# Patient Record
Sex: Male | Born: 1960
Health system: Southern US, Community
[De-identification: ages and names within clinical notes are randomized; demographics above are authoritative.]

## PROBLEM LIST (undated history)

## (undated) DIAGNOSIS — J869 Pyothorax without fistula: Secondary | ICD-10-CM

## (undated) DIAGNOSIS — E119 Type 2 diabetes mellitus without complications: Secondary | ICD-10-CM

## (undated) HISTORY — PX: SHOULDER SURGERY: SHX246

## (undated) HISTORY — PX: SKIN GRAFT: SHX250

---

## 2001-06-01 ENCOUNTER — Emergency Department (HOSPITAL_COMMUNITY): Admission: AC | Admit: 2001-06-01 | Discharge: 2001-06-01 | Payer: Self-pay

## 2001-06-01 ENCOUNTER — Encounter: Payer: Self-pay | Admitting: Emergency Medicine

## 2017-08-04 ENCOUNTER — Emergency Department (HOSPITAL_COMMUNITY): Payer: Self-pay

## 2017-08-04 ENCOUNTER — Observation Stay (HOSPITAL_COMMUNITY): Payer: Self-pay

## 2017-08-04 ENCOUNTER — Inpatient Hospital Stay (HOSPITAL_COMMUNITY)
Admission: EM | Admit: 2017-08-04 | Discharge: 2017-08-15 | DRG: 163 | Disposition: A | Discharge status: Home / Self Care | Payer: Self-pay | Attending: Family Medicine | Admitting: Family Medicine

## 2017-08-04 ENCOUNTER — Other Ambulatory Visit: Payer: Self-pay

## 2017-08-04 ENCOUNTER — Encounter (HOSPITAL_COMMUNITY): Payer: Self-pay | Admitting: Emergency Medicine

## 2017-08-04 DIAGNOSIS — J918 Pleural effusion in other conditions classified elsewhere: Secondary | ICD-10-CM | POA: Diagnosis present

## 2017-08-04 DIAGNOSIS — L304 Erythema intertrigo: Secondary | ICD-10-CM | POA: Diagnosis present

## 2017-08-04 DIAGNOSIS — F17210 Nicotine dependence, cigarettes, uncomplicated: Secondary | ICD-10-CM | POA: Diagnosis present

## 2017-08-04 DIAGNOSIS — R0602 Shortness of breath: Secondary | ICD-10-CM | POA: Diagnosis present

## 2017-08-04 DIAGNOSIS — E871 Hypo-osmolality and hyponatremia: Secondary | ICD-10-CM | POA: Diagnosis not present

## 2017-08-04 DIAGNOSIS — D751 Secondary polycythemia: Secondary | ICD-10-CM | POA: Diagnosis present

## 2017-08-04 DIAGNOSIS — B372 Candidiasis of skin and nail: Secondary | ICD-10-CM

## 2017-08-04 DIAGNOSIS — J90 Pleural effusion, not elsewhere classified: Secondary | ICD-10-CM | POA: Diagnosis present

## 2017-08-04 DIAGNOSIS — J9811 Atelectasis: Secondary | ICD-10-CM | POA: Diagnosis present

## 2017-08-04 DIAGNOSIS — Z4682 Encounter for fitting and adjustment of non-vascular catheter: Secondary | ICD-10-CM

## 2017-08-04 DIAGNOSIS — J690 Pneumonitis due to inhalation of food and vomit: Secondary | ICD-10-CM | POA: Diagnosis present

## 2017-08-04 DIAGNOSIS — J939 Pneumothorax, unspecified: Secondary | ICD-10-CM

## 2017-08-04 DIAGNOSIS — Z9689 Presence of other specified functional implants: Secondary | ICD-10-CM

## 2017-08-04 DIAGNOSIS — D72829 Elevated white blood cell count, unspecified: Secondary | ICD-10-CM | POA: Diagnosis present

## 2017-08-04 DIAGNOSIS — R109 Unspecified abdominal pain: Secondary | ICD-10-CM | POA: Diagnosis present

## 2017-08-04 DIAGNOSIS — D7589 Other specified diseases of blood and blood-forming organs: Secondary | ICD-10-CM | POA: Diagnosis present

## 2017-08-04 DIAGNOSIS — E876 Hypokalemia: Secondary | ICD-10-CM

## 2017-08-04 DIAGNOSIS — Z66 Do not resuscitate: Secondary | ICD-10-CM | POA: Diagnosis present

## 2017-08-04 DIAGNOSIS — J869 Pyothorax without fistula: Principal | ICD-10-CM | POA: Diagnosis present

## 2017-08-04 DIAGNOSIS — L84 Corns and callosities: Secondary | ICD-10-CM | POA: Diagnosis present

## 2017-08-04 DIAGNOSIS — Z9889 Other specified postprocedural states: Secondary | ICD-10-CM

## 2017-08-04 DIAGNOSIS — Z09 Encounter for follow-up examination after completed treatment for conditions other than malignant neoplasm: Secondary | ICD-10-CM

## 2017-08-04 LAB — CBC
HCT: 52.1 % — ABNORMAL HIGH (ref 39.0–52.0)
HEMOGLOBIN: 18.8 g/dL — AB (ref 13.0–17.0)
MCH: 35.5 pg — ABNORMAL HIGH (ref 26.0–34.0)
MCHC: 36.1 g/dL — ABNORMAL HIGH (ref 30.0–36.0)
MCV: 98.5 fL (ref 78.0–100.0)
Platelets: 225 10*3/uL (ref 150–400)
RBC: 5.29 MIL/uL (ref 4.22–5.81)
RDW: 14.2 % (ref 11.5–15.5)
WBC: 14.6 10*3/uL — AB (ref 4.0–10.5)

## 2017-08-04 LAB — BODY FLUID CELL COUNT WITH DIFFERENTIAL
Eos, Fluid: 1 %
LYMPHS FL: 2 %
MONOCYTE-MACROPHAGE-SEROUS FLUID: 6 % — AB (ref 50–90)
Neutrophil Count, Fluid: 91 % — ABNORMAL HIGH (ref 0–25)
Total Nucleated Cell Count, Fluid: 26503 cu mm — ABNORMAL HIGH (ref 0–1000)

## 2017-08-04 LAB — LACTATE DEHYDROGENASE, PLEURAL OR PERITONEAL FLUID: LD, Fluid: 1214 U/L — ABNORMAL HIGH (ref 3–23)

## 2017-08-04 LAB — BASIC METABOLIC PANEL
ANION GAP: 18 — AB (ref 5–15)
BUN: 24 mg/dL — ABNORMAL HIGH (ref 6–20)
CALCIUM: 8.9 mg/dL (ref 8.9–10.3)
CO2: 15 mmol/L — ABNORMAL LOW (ref 22–32)
Chloride: 101 mmol/L (ref 101–111)
Creatinine, Ser: 1.11 mg/dL (ref 0.61–1.24)
Glucose, Bld: 143 mg/dL — ABNORMAL HIGH (ref 65–99)
Potassium: 2.8 mmol/L — ABNORMAL LOW (ref 3.5–5.1)
Sodium: 134 mmol/L — ABNORMAL LOW (ref 135–145)

## 2017-08-04 LAB — PROTEIN, PLEURAL OR PERITONEAL FLUID: Total protein, fluid: 4.4 g/dL

## 2017-08-04 LAB — I-STAT TROPONIN, ED: TROPONIN I, POC: 0 ng/mL (ref 0.00–0.08)

## 2017-08-04 LAB — MAGNESIUM: MAGNESIUM: 1.8 mg/dL (ref 1.7–2.4)

## 2017-08-04 LAB — PROTIME-INR
INR: 1.19
PROTHROMBIN TIME: 15 s (ref 11.4–15.2)

## 2017-08-04 LAB — GLUCOSE, PLEURAL OR PERITONEAL FLUID

## 2017-08-04 MED ORDER — IOPAMIDOL (ISOVUE-300) INJECTION 61%
75.0000 mL | Freq: Once | INTRAVENOUS | Status: AC | PRN
  Administered 2017-08-04: 75 mL via INTRAVENOUS

## 2017-08-04 MED ORDER — SODIUM CHLORIDE 0.9 % IV SOLN
2.0000 g | INTRAVENOUS | Status: DC
  Administered 2017-08-04 – 2017-08-05 (×2): 2 g via INTRAVENOUS
  Filled 2017-08-04 (×3): qty 20

## 2017-08-04 MED ORDER — SODIUM CHLORIDE 0.9% FLUSH
3.0000 mL | Freq: Two times a day (BID) | INTRAVENOUS | Status: DC
  Administered 2017-08-04 – 2017-08-14 (×14): 3 mL via INTRAVENOUS

## 2017-08-04 MED ORDER — KETOCONAZOLE 2 % EX CREA
TOPICAL_CREAM | Freq: Two times a day (BID) | CUTANEOUS | Status: DC
  Administered 2017-08-04 – 2017-08-07 (×6): via TOPICAL
  Administered 2017-08-08: 1 via TOPICAL
  Administered 2017-08-08: 10:00:00 via TOPICAL
  Administered 2017-08-09: 1 via TOPICAL
  Administered 2017-08-09 – 2017-08-13 (×5): via TOPICAL
  Administered 2017-08-13: 1 via TOPICAL
  Filled 2017-08-04 (×3): qty 15

## 2017-08-04 MED ORDER — SODIUM CHLORIDE 0.9 % IV SOLN
250.0000 mL | INTRAVENOUS | Status: DC | PRN
  Administered 2017-08-12: 250 mL via INTRAVENOUS

## 2017-08-04 MED ORDER — POTASSIUM CHLORIDE CRYS ER 20 MEQ PO TBCR
40.0000 meq | EXTENDED_RELEASE_TABLET | ORAL | Status: AC
  Administered 2017-08-04: 40 meq via ORAL
  Filled 2017-08-04: qty 2

## 2017-08-04 MED ORDER — AZITHROMYCIN 250 MG PO TABS
500.0000 mg | ORAL_TABLET | Freq: Every day | ORAL | Status: DC
  Administered 2017-08-04 – 2017-08-06 (×3): 500 mg via ORAL
  Filled 2017-08-04 (×3): qty 2

## 2017-08-04 MED ORDER — POTASSIUM CHLORIDE CRYS ER 20 MEQ PO TBCR: 40.0000 meq | EXTENDED_RELEASE_TABLET | Freq: Once | ORAL | Status: DC

## 2017-08-04 MED ORDER — HYDROCODONE-ACETAMINOPHEN 5-325 MG PO TABS
1.0000 | ORAL_TABLET | Freq: Four times a day (QID) | ORAL | Status: DC | PRN
  Administered 2017-08-05 (×2): 1 via ORAL
  Administered 2017-08-06: 2 via ORAL
  Filled 2017-08-04: qty 2
  Filled 2017-08-04 (×2): qty 1

## 2017-08-04 MED ORDER — SODIUM CHLORIDE 0.9 % IV SOLN
1.0000 g | INTRAVENOUS | Status: DC
  Filled 2017-08-04: qty 10

## 2017-08-04 MED ORDER — LIDOCAINE HCL 1 % IJ SOLN
INTRAMUSCULAR | Status: AC
  Filled 2017-08-04: qty 20

## 2017-08-04 MED ORDER — SODIUM CHLORIDE 0.9% FLUSH: 3.0000 mL | INTRAVENOUS | Status: DC | PRN

## 2017-08-04 MED ORDER — ENOXAPARIN SODIUM 40 MG/0.4ML ~~LOC~~ SOLN
40.0000 mg | SUBCUTANEOUS | Status: DC
  Administered 2017-08-04 – 2017-08-05 (×2): 40 mg via SUBCUTANEOUS
  Filled 2017-08-04 (×2): qty 0.4

## 2017-08-04 NOTE — ED Notes (Signed)
Pt being sent by UC d/t L chest pain radiating to L flank x 11 days.  Pain and SOB increases w/ activity and decreases w/ rest.  L lung fields diminished.  Normal EKG.  Per UC staff, no chest xray completed d/t Pt being self pay.  Pulse Ox 93% RA.

## 2017-08-04 NOTE — ED Notes (Signed)
Patient went to IR.  

## 2017-08-04 NOTE — Procedures (Signed)
PROCEDURE SUMMARY:  Successful image-guided left thoracentesis. Yielded 1.8 liters of hazy amber fluid. Patient tolerated procedure well. No immediate complications.  Specimen was sent for labs. CXR ordered.  Keyron Pokorski PA-C 08/04/2017 2:30 PM

## 2017-08-04 NOTE — Progress Notes (Signed)
Rocephin q24 hr adjusted by Rx from 1g to 2g  with weight > 100 kg  Bernadene Personrew Wanda Cellucci, PharmD, BCPS [phone] 08/04/2017, 6:42 PM

## 2017-08-04 NOTE — ED Provider Notes (Signed)
[facility]-EMERGENCY DEPT Provider Note   CSN: [id number] Arrival date & time: 08/04/17  1141     History   Chief Complaint Chief Complaint  Patient presents with  . Chest Pain    HPI Prateek Knipple is a 57 y.o. male.  HPI 57 year old male without significant past medical history but does have a history of tobacco abuse presenting to the emergency department with a 11 to 12 days of worsening left-sided chest discomfort with associated exertional shortness of breath.  He reports poor appetite.  Has had chills without documented fever.  Never has had symptoms like this before.  Reports increased exertional shortness of breath over the past several days.  No history of DVT or pulmonary embolism.  No unilateral leg swelling.  No recent long travel or surgery.  Seen in urgent care was sent to the emergency department with decreased breath sounds left side.  No chest x-ray was performed.   History reviewed. No pertinent past medical history.  There are no active problems to display for this patient.   History reviewed. No pertinent surgical history.      Home Medications    Prior to Admission medications   Not on File    Family History No family history on file.  Social History Social History   Tobacco Use  . Smoking status: Not on file  Substance Use Topics  . Alcohol use: Not on file  . Drug use: Not on file     Allergies   Patient has no known allergies.   Review of Systems Review of Systems  All other systems reviewed and are negative.    Physical Exam Updated Vital Signs BP (!) 148/89   Pulse 94   Temp 97.7 F (36.5 C) (Oral)   Resp 18   Ht 6\' 1"  (1.854 m)   Wt 115.2 kg (254 lb)   SpO2 94%   BMI 33.51 kg/m   Physical Exam  Constitutional: He is oriented to person, place, and time. He appears well-developed and well-nourished.  HENT:  Head: Normocephalic.  Eyes: EOM are normal.  Neck: Normal range of motion.    Cardiovascular:  Tachycardic rate.  Normal rhythm.  Pulmonary/Chest: Effort normal.  Decreased breath sounds on the left as compared to the right.  Abdominal: Soft. He exhibits no distension. There is no tenderness.  Musculoskeletal: Normal range of motion. He exhibits no edema.  Neurological: He is alert and oriented to person, place, and time.  Psychiatric: He has a normal mood and affect.  Nursing note and vitals reviewed.    ED Treatments / Results  Labs (all labs ordered are listed, but only abnormal results are displayed) Labs Reviewed  BASIC METABOLIC PANEL - Abnormal; Notable for the following components:      Result Value   Sodium 134 (*)    Potassium 2.8 (*)    CO2 15 (*)    Glucose, Bld 143 (*)    BUN 24 (*)    Anion gap 18 (*)    All other components within normal limits  CBC - Abnormal; Notable for the following components:   WBC 14.6 (*)    Hemoglobin 18.8 (*)    HCT 52.1 (*)    MCH 35.5 (*)    MCHC 36.1 (*)    All other components within normal limits  CULTURE, BLOOD (ROUTINE X 2)  CULTURE, BLOOD (ROUTINE X 2)  PROTIME-INR  HIV ANTIBODY (ROUTINE TESTING)  I-STAT TROPONIN, ED    EKG  None  Radiology Dg Chest 2 View  Result Date: 08/04/2017 CLINICAL DATA:  Chest pain, shortness of breath. EXAM: CHEST - 2 VIEW COMPARISON:  None. FINDINGS: The heart size and mediastinal contours are within normal limits. Right lung is clear. No pneumothorax is noted. Interval development of large left pleural effusion is noted with probable underlying atelectasis or infiltrate. The visualized skeletal structures are unremarkable. IMPRESSION: Interval development of large left pleural effusion with probable underlying atelectasis or infiltrate. Electronically Signed   By: Lupita RaiderJames  Green Jr, M.D.   On: 08/04/2017 12:50    Procedures Procedures (including critical care time)  Medications Ordered in ED Medications - No data to display   Initial Impression / Assessment and  Plan / ED Course  I have reviewed the triage vital signs and the nursing notes.  Pertinent labs & imaging results that were available during my care of the patient were reviewed by me and considered in my medical decision making (see chart for details).     White blood cell count 14,600.  Patient with large left-sided pleural effusion.  This will need thoracentesis.  Ultrasound-guided thoracentesis and appropriate laboratory/cytology has been ordered.  He does have a history of tobacco abuse and this could represent malignant effusion given his poor appetite.  Blood cultures obtained.  Additional testing after ultrasound guided thoracentesis to determine transudative versus exudative effusion.  Will need follow-up chest x-ray after thoracentesis.  Patient will likely benefit from CT scan of the chest after thoracentesis.  Patient will be admitted to hospital for ongoing work-up  Final Clinical Impressions(s) / ED Diagnoses   Final diagnoses:  SOB (shortness of breath)  Pleural effusion, left    ED Discharge Orders    None       Azalia Bilisampos, Brennah Quraishi, MD 08/04/17 1351

## 2017-08-04 NOTE — ED Notes (Signed)
Returned from IR. 1.8 Liters of fluids removed.

## 2017-08-04 NOTE — ED Triage Notes (Signed)
Constant left chest pain ongoing for 12 days; denies injury or associated symptoms other than poor appetite.

## 2017-08-04 NOTE — Progress Notes (Signed)
Pt transferred to [place] for further treatment. Pt is alert and oriented x 4. V/S within normal limits. [facility].

## 2017-08-04 NOTE — ED Notes (Signed)
ED TO INPATIENT HANDOFF REPORT  Name/Age/Gender Bryce Mendoza 57 y.o. male  Code Status   Home/SNF/Other Home  Chief Complaint chest pain   Level of Care/Admitting Diagnosis ED Disposition    ED Disposition Condition [facility] Area: [place] [[id number]]  Level of Care: Med-Surg [16]  Diagnosis: Pleural effusion on left [300923]  Admitting Physician: Mariel Aloe [[id number]]  Attending Physician: Mariel Aloe [phone]  PT Class (Do Not Modify): Observation [104]  PT Acc Code (Do Not Modify): Observation [10022]       Medical History History reviewed. No pertinent past medical history.  Allergies No Known Allergies  IV Location/Drains/Wounds Patient Lines/Drains/Airways Status   Active Line/Drains/Airways    Name:   Placement date:   Placement time:   Site:   Days:   Peripheral IV 08/04/17 Right Antecubital   08/04/17    1158    Antecubital   less than 1          Labs/Imaging Results for orders placed or performed during the hospital encounter of 08/04/17 (from the past 48 hour(s))  Basic metabolic panel     Status: Abnormal   Collection Time: 08/04/17 11:59 AM  Result Value Ref Range   Sodium 134 (L) 135 - 145 mmol/L   Potassium 2.8 (L) 3.5 - 5.1 mmol/L   Chloride 101 101 - 111 mmol/L   CO2 15 (L) 22 - 32 mmol/L   Glucose, Bld 143 (H) 65 - 99 mg/dL   BUN 24 (H) 6 - 20 mg/dL   Creatinine, Ser 1.11 0.61 - 1.24 mg/dL   Calcium 8.9 8.9 - 10.3 mg/dL   GFR calc non Af Amer >60 >60 mL/min   GFR calc Af Amer >60 >60 mL/min    Comment: (NOTE) The eGFR has been calculated using the CKD EPI equation. This calculation has not been validated in all clinical situations. eGFR's persistently <60 mL/min signify possible Chronic Kidney Disease.    Anion gap 18 (H) 5 - 15    Comment: Performed at [facility], [place] [address] [place], [place] [address]  CBC     Status: Abnormal   Collection Time: 08/04/17 11:59 AM   Result Value Ref Range   WBC 14.6 (H) 4.0 - 10.5 K/uL   RBC 5.29 4.22 - 5.81 MIL/uL   Hemoglobin 18.8 (H) 13.0 - 17.0 g/dL   HCT 52.1 (H) 39.0 - 52.0 %   MCV 98.5 78.0 - 100.0 fL   MCH 35.5 (H) 26.0 - 34.0 pg   MCHC 36.1 (H) 30.0 - 36.0 g/dL   RDW 14.2 11.5 - 15.5 %   Platelets 225 150 - 400 K/uL    Comment: Performed at [facility], [place] [address] [place], [place] [address]  Protime-INR     Status: None   Collection Time: 08/04/17 11:59 AM  Result Value Ref Range   Prothrombin Time 15.0 11.4 - 15.2 seconds   INR 1.19     Comment: Performed at [facility], [place] [address], [place], [place] [address]  Magnesium     Status: None   Collection Time: 08/04/17 11:59 AM  Result Value Ref Range   Magnesium 1.8 1.7 - 2.4 mg/dL    Comment: Performed at [facility], [place] [address], [place], [place] [address]  I-stat troponin, ED     Status: None   Collection Time: 08/04/17 12:01 PM  Result Value Ref Range  Troponin i, poc 0.00 0.00 - 0.08 ng/mL   Comment 3            Comment: Due to the release kinetics of cTnI, a negative result within the first hours of the onset of symptoms does not rule out myocardial infarction with certainty. If myocardial infarction is still suspected, repeat the test at appropriate intervals.   Body fluid cell count with differential     Status: Abnormal   Collection Time: 08/04/17  2:11 PM  Result Value Ref Range   Fluid Type-FCT PLEURAL     Comment: LEFT CORRECTED ON 06/04 AT 1444: PREVIOUSLY REPORTED AS Pleural, L    Color, Fluid AMBER (A) YELLOW   Appearance, Fluid TURBID (A) CLEAR   WBC, Fluid 26,503 (H) 0 - 1,000 cu mm    Comment: Performed at [facility], [place] [address] [place], [place] [address]   Neutrophil Count, Fluid PENDING 0 - 25 %   Lymphs, Fluid PENDING %   Monocyte-Macrophage-Serous Fluid PENDING 50 - 90 %   Eos, Fluid PENDING %   Other Cells, Fluid  PENDING %   Dg Chest 1 View  Result Date: 08/04/2017 CLINICAL DATA:  Status post LEFT thoracentesis. EXAM: CHEST  1 VIEW COMPARISON:  08/04/2017 radiographs FINDINGS: LEFT pleural effusion has decreased in size, now moderate to large. There is no evidence of pneumothorax. RIGHT lung is clear. The cardiomediastinal silhouette is unchanged. IMPRESSION: Decreased LEFT pleural effusion, now moderate to large. No pneumothorax. Electronically Signed   By: Margarette Canada M.D.   On: 08/04/2017 14:39   Dg Chest 2 View  Result Date: 08/04/2017 CLINICAL DATA:  Chest pain, shortness of breath. EXAM: CHEST - 2 VIEW COMPARISON:  None. FINDINGS: The heart size and mediastinal contours are within normal limits. Right lung is clear. No pneumothorax is noted. Interval development of large left pleural effusion is noted with probable underlying atelectasis or infiltrate. The visualized skeletal structures are unremarkable. IMPRESSION: Interval development of large left pleural effusion with probable underlying atelectasis or infiltrate. Electronically Signed   By: Marijo Conception, M.D.   On: 08/04/2017 12:50   Ct Chest W Contrast  Result Date: 08/04/2017 CLINICAL DATA:  Dyspnea with left-sided chest pain on 11 days ago. Thoracentesis earlier today. EXAM: CT CHEST WITH CONTRAST TECHNIQUE: Multidetector CT imaging of the chest was performed during intravenous contrast administration. CONTRAST:  34m ISOVUE-300 IOPAMIDOL (ISOVUE-300) INJECTION 61% COMPARISON:  Chest radiographs earlier today FINDINGS: Cardiovascular: Suboptimal contrast bolus. No acute vascular findings are seen. There is mild coronary artery atherosclerosis. The heart size is normal. There is no pericardial effusion. Mediastinum/Nodes: There are no enlarged mediastinal, hilar or axillary lymph nodes.There are scattered small mediastinal lymph nodes. There are also prominent left internal mammary lymph nodes, measuring to 10 mm short axis on image 55/2. The thyroid  gland, trachea and esophagus demonstrate no significant findings. Lungs/Pleura: There is a moderate size residual left pleural effusion which is loculated laterally and with associated pleural thickening. No pleural based mass identified on noncontrast imaging. There is no pneumothorax or right-sided pleural effusion. There is compressive left lung atelectasis, especially in the lower lobe. The right lung is well aerated with patchy ground-glass opacities, especially in the upper lobe. No pulmonary nodule or endobronchial lesion seen. Upper abdomen: Borderline hepatic steatosis without apparent focal hepatic abnormality. The adrenal glands appear normal. Portacaval node measuring 17 mm short axis on image 165/2. Musculoskeletal/Chest wall: There is no chest wall mass or suspicious osseous  finding. IMPRESSION: 1. Moderate size residual loculated left pleural effusion is suboptimally characterized without contrast, although is associated with some pleural thickening, suggesting a possible exudate. Correlate with thoracentesis results. 2. Resulting compressive left lung atelectasis, especially in the lower lobe. No endobronchial lesion or suspicious nodule within the aerated lungs. 3. Patchy right lung ground-glass opacities (mosaic attenuation), likely related to small airways disease or edema. No evidence of pulmonary embolism on nondedicated imaging. 4. Enlarged left internal mammary and portacaval lymph nodes are nonspecific and possibly reactive. No mediastinal adenopathy. Electronically Signed   By: Richardean Sale M.D.   On: 08/04/2017 16:35   US Thoracentesis Asp Pleural Space W/img Guide  Result Date: 08/04/2017 INDICATION: Patient with history of worsening left-sided chest discomfort, exertional dyspnea, and left-sided pleural effusion. Request is made for diagnostic and therapeutic thoracentesis. EXAM: ULTRASOUND GUIDED DIAGNOSTIC AND THERAPEUTIC LEFT THORACENTESIS MEDICATIONS: 10 milliliters of 1%  lidocaine COMPLICATIONS: None immediate. PROCEDURE: An ultrasound guided thoracentesis was thoroughly discussed with the patient and questions answered. The benefits, risks, alternatives and complications were also discussed. The patient understands and wishes to proceed with the procedure. Written consent was obtained. Ultrasound was performed to localize and mark an adequate pocket of fluid in the left chest. The area was then prepped and draped in the normal sterile fashion. 1% Lidocaine was used for local anesthesia. Under ultrasound guidance a 6 Fr Safe-T-Centesis catheter was introduced. Thoracentesis was performed. The catheter was removed and a dressing applied. FINDINGS: A total of approximately 1.8 liters of hazy amber fluid was removed. Samples were sent to the laboratory as requested by the clinical team. IMPRESSION: Successful ultrasound guided left thoracentesis yielding 1.8 liters of pleural fluid. Read by: Earley Abide, PA-C Electronically Signed   By: Marybelle Killings M.D.   On: 08/04/2017 14:42    Pending Labs Unresulted Labs (From admission, onward)   Start     Ordered   08/04/17 1412  Lactate dehydrogenase (pleural or peritoneal fluid)  R     08/04/17 1412   08/04/17 1412  Body fluid culture  R     08/04/17 1412   08/04/17 1412  Amylase, pleural or peritoneal fluid  R     08/04/17 1412   08/04/17 1412  Protein, pleural or peritoneal fluid  R     08/04/17 1412   08/04/17 1412  Glucose, pleural or peritoneal fluid  R     08/04/17 1412   08/04/17 1412  Acid Fast Culture with reflexed sensitivities  R     08/04/17 1412   08/04/17 1412  Acid Fast Smear (AFB)  R     08/04/17 1412   08/04/17 1339  HIV antibody  Once,   STAT     08/04/17 1338   08/04/17 1338  Blood culture (routine x 2)  BLOOD CULTURE X 2,   STAT     08/04/17 1337   Signed and Held  Creatinine, serum  (enoxaparin (LOVENOX)    CrCl >/= 30 ml/min)  Weekly,   R    Comments:  while on enoxaparin therapy    Signed  and Held   Signed and Held  Basic metabolic panel  Tomorrow morning,   R     Signed and Held   Signed and Held  CBC  Tomorrow morning,   R     Signed and Held      Vitals/Pain Today's Vitals   08/04/17 1151 08/04/17 1230 08/04/17 1306 08/04/17 1522  BP: (!) 134/99 140/86  [name])  147/100  Pulse: (!) 103 95 94 88  Resp: (!) 22  18 (!) 35  Temp: 97.7 F (36.5 C)     TempSrc: Oral     SpO2: 95%  94% 94%  Weight: 254 lb (115.2 kg)     Height: '6\' 1"'  (1.854 m)     PainSc:        Isolation Precautions No active isolations  Medications Medications  lidocaine (XYLOCAINE) 1 % (with pres) injection (has no administration in time range)  HYDROcodone-acetaminophen (NORCO/VICODIN) 5-325 MG per tablet 1-2 tablet (has no administration in time range)  potassium chloride SA (K-DUR,KLOR-CON) CR tablet 40 mEq (has no administration in time range)  iopamidol (ISOVUE-300) 61 % injection 75 mL (75 mLs Intravenous Contrast Given 08/04/17 1606)    Mobility walks

## 2017-08-04 NOTE — ED Notes (Signed)
Bed: [id number] Expected date:  Expected time:  Means of arrival:  [facility]

## 2017-08-04 NOTE — ED Notes (Signed)
Patient transported to X-ray 
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
## 2017-08-04 NOTE — ED Notes (Signed)
Lab called about 2 gold being hemolyzed. Told floor RN about this.

## 2017-08-05 ENCOUNTER — Encounter (HOSPITAL_COMMUNITY): Payer: Self-pay | Admitting: Thoracic Surgery (Cardiothoracic Vascular Surgery)

## 2017-08-05 DIAGNOSIS — J869 Pyothorax without fistula: Secondary | ICD-10-CM

## 2017-08-05 LAB — COMPREHENSIVE METABOLIC PANEL
ALK PHOS: 93 U/L (ref 38–126)
ALT: 18 U/L (ref 17–63)
AST: 26 U/L (ref 15–41)
Albumin: 2.2 g/dL — ABNORMAL LOW (ref 3.5–5.0)
Anion gap: 14 (ref 5–15)
BILIRUBIN TOTAL: 1.4 mg/dL — AB (ref 0.3–1.2)
BUN: 18 mg/dL (ref 6–20)
CALCIUM: 8.8 mg/dL — AB (ref 8.9–10.3)
CO2: 22 mmol/L (ref 22–32)
Chloride: 100 mmol/L — ABNORMAL LOW (ref 101–111)
Creatinine, Ser: 1.07 mg/dL (ref 0.61–1.24)
GFR calc Af Amer: >60 mL/min (ref >60)
Glucose, Bld: 119 mg/dL — ABNORMAL HIGH (ref 65–99)
POTASSIUM: 3.1 mmol/L — AB (ref 3.5–5.1)
Sodium: 136 mmol/L (ref 135–145)
Total Protein: 6.8 g/dL (ref 6.5–8.1)

## 2017-08-05 LAB — AMYLASE, PLEURAL OR PERITONEAL FLUID: AMYLASE FL: 35 U/L

## 2017-08-05 LAB — HIV ANTIBODY (ROUTINE TESTING W REFLEX): HIV SCREEN 4TH GENERATION: NONREACTIVE

## 2017-08-05 LAB — TYPE AND SCREEN
ABO/RH(D): B POS
Antibody Screen: NEGATIVE

## 2017-08-05 LAB — CBC
HEMATOCRIT: 51.5 % (ref 39.0–52.0)
Hemoglobin: 18.1 g/dL — ABNORMAL HIGH (ref 13.0–17.0)
MCH: 34.9 pg — ABNORMAL HIGH (ref 26.0–34.0)
MCHC: 35.1 g/dL (ref 30.0–36.0)
MCV: 99.2 fL (ref 78.0–100.0)
Platelets: 279 10*3/uL (ref 150–400)
RBC: 5.19 MIL/uL (ref 4.22–5.81)
RDW: 13.4 % (ref 11.5–15.5)
WBC: 10.4 10*3/uL (ref 4.0–10.5)

## 2017-08-05 LAB — APTT: aPTT: 47 seconds — ABNORMAL HIGH (ref 24–36)

## 2017-08-05 LAB — ACID FAST SMEAR (AFB, MYCOBACTERIA): Acid Fast Smear: NEGATIVE

## 2017-08-05 LAB — LACTATE DEHYDROGENASE: LDH: 139 U/L (ref 98–192)

## 2017-08-05 LAB — PROTIME-INR
INR: 1.23
PROTHROMBIN TIME: 15.4 s — AB (ref 11.4–15.2)

## 2017-08-05 LAB — ABO/RH: ABO/RH(D): B POS

## 2017-08-05 LAB — PREALBUMIN: Prealbumin: 5.7 mg/dL — ABNORMAL LOW (ref 18–38)

## 2017-08-05 MED ORDER — POTASSIUM CHLORIDE CRYS ER 20 MEQ PO TBCR
40.0000 meq | EXTENDED_RELEASE_TABLET | Freq: Four times a day (QID) | ORAL | Status: AC
  Administered 2017-08-05 (×3): 40 meq via ORAL
  Filled 2017-08-05 (×3): qty 2

## 2017-08-05 MED ORDER — GUAIFENESIN ER 600 MG PO TB12
1200.0000 mg | ORAL_TABLET | Freq: Two times a day (BID) | ORAL | Status: DC
  Administered 2017-08-05 – 2017-08-14 (×20): 1200 mg via ORAL
  Filled 2017-08-05 (×20): qty 2

## 2017-08-05 NOTE — Progress Notes (Signed)
PROGRESS NOTE                                                                                                                                                                                                             Patient Demographics:    Bryce Mendoza, is a 57 y.o. male, DOB - December 16, 1960, MWU:[id number]  Admit date - 08/04/2017   Admitting Physician Narda Bondsalph A Nettey, MD  Outpatient Primary MD for the patient is Patient, No Pcp Per  LOS - 0   Chief Complaint  Patient presents with  . Chest Pain       Brief Narrative   57 year old male without significant past medical history, presents with dyspnea, work-up significant for left-sided pleural effusion, status post thoracentesis with 1.8 L drained, with residual loculated pleural effusion present, work-up significant for exudative pleural effusion.    Subjective:    Bryce Lingoric Vayda today has, No headache, No chest pain, No abdominal pain -reports dyspnea has improved, and he is feeling better today.   Assessment  & Plan :    Active Problems:   Pleural effusion on left   Large left pleural effusion -Appears to be exudative pleural effusion, by LDH and protein criteria, Gram stain with no growth, 26K white blood cells present in the fluid, CT surgery input greatly appreciated, he does appear to have some neck left pleural effusion, likely will need further procedures to drain(thoracentesis versus chest tube versus VATS) -Follow cytology -For now continue with IV Rocephin and azithromycin pending further cultures. -Leukocytosis trending down.  Hypokalemia -Repleted, recheck in a.m.  Polycythemia -Likely secondary polycythemia related to smoking, further work-up as an outpatient.      Code Status : Full code  Family Communication  : None at bedside  Disposition Plan  : Home when stable  Barriers For Discharge :   Consults  :  CT surgery  Procedures  : Ultrasound-guided  thoracentesis subcu Lovenox  DVT Prophylaxis  :  [place] LOENOX  Lab Results  Component Value Date   PLT 279 08/05/2017    Antibiotics  :    Anti-infectives (From admission, onward)   Start     Dose/Rate Route Frequency Ordered Stop   08/04/17 2000  cefTRIAXone (ROCEPHIN) 1 g in sodium chloride 0.9 % 100 mL IVPB  Status:  Discontinued     1  g 200 mL/hr over 30 Minutes Intravenous Every 24 hours 08/04/17 1819 08/04/17 1836   08/04/17 2000  azithromycin (ZITHROMAX) tablet 500 mg     500 mg Oral Daily 08/04/17 1819     08/04/17 2000  cefTRIAXone (ROCEPHIN) 2 g in sodium chloride 0.9 % 100 mL IVPB     2 g 200 mL/hr over 30 Minutes Intravenous Every 24 hours 08/04/17 1836          Objective:   Vitals:   08/04/17 2056 08/04/17 2058 08/05/17 0007 08/05/17 0712  BP:  (!) 162/92 (!) 146/92 (!) 142/85  Pulse:  96 84 87  Resp:    20  Temp:  98.5 F (36.9 C) 100.1 F (37.8 C) 98.3 F (36.8 C)  TempSrc:  Oral Oral Oral  SpO2:  96% 95% 95%  Weight: 116.3 kg (256 lb 6.3 oz)     Height: 6\' 1"  (1.854 m)       Wt Readings from Last 3 Encounters:  08/04/17 116.3 kg (256 lb 6.3 oz)     Intake/Output Summary (Last 24 hours) at 08/05/2017 1019 Last data filed at 08/05/2017 0400 Gross per 24 hour  Intake 100 ml  Output -  Net 100 ml     Physical Exam  Awake Alert, Oriented X 3, No new F.N deficits, Normal affect Symmetrical Chest wall movement, diminished air entry in the left lung, no wheezing RRR,No Gallops,Rubs or new Murmurs, No Parasternal Heave +ve B.Sounds, Abd Soft, No tenderness,, No rebound - guarding or rigidity. No Cyanosis, Clubbing or edema, No new Rash or bruise      Data Review:    CBC Recent Labs  Lab 08/04/17 1159 08/05/17 0741  WBC 14.6* 10.4  HGB 18.8* 18.1*  HCT 52.1* 51.5  PLT 225 279  MCV 98.5 99.2  MCH 35.5* 34.9*  MCHC 36.1* 35.1  RDW 14.2 13.4    Chemistries  Recent Labs  Lab 08/04/17 1159 08/05/17 0741  NA 134* 136  K 2.8* 3.1*  CL  101 100*  CO2 15* 22  GLUCOSE 143* 119*  BUN 24* 18  CREATININE 1.11 1.07  CALCIUM 8.9 8.8*  MG 1.8  --   AST  --  26  ALT  --  18  ALKPHOS  --  93  BILITOT  --  1.4*   ------------------------------------------------------------------------------------------------------------------ No results for input(s): CHOL, HDL, LDLCALC, TRIG, CHOLHDL, LDLDIRECT in the last 72 hours.  No results found for: HGBA1C ------------------------------------------------------------------------------------------------------------------ No results for input(s): TSH, T4TOTAL, T3FREE, THYROIDAB in the last 72 hours.  Invalid input(s): FREET3 ------------------------------------------------------------------------------------------------------------------ No results for input(s): VITAMINB12, FOLATE, FERRITIN, TIBC, IRON, RETICCTPCT in the last 72 hours.  Coagulation profile Recent Labs  Lab 08/04/17 1159 08/05/17 0741  INR 1.19 1.23    No results for input(s): DDIMER in the last 72 hours.  Cardiac Enzymes No results for input(s): CKMB, TROPONINI, MYOGLOBIN in the last 168 hours.  Invalid input(s): CK ------------------------------------------------------------------------------------------------------------------ No results found for: BNP  Inpatient Medications  Scheduled Meds: . azithromycin  500 mg Oral Daily  . enoxaparin (LOVENOX) injection  40 mg Subcutaneous Q24H  . ketoconazole   Topical BID  . sodium chloride flush  3 mL Intravenous Q12H   Continuous Infusions: . sodium chloride    . cefTRIAXone (ROCEPHIN)  IV Stopped (08/05/17 0030)   PRN Meds:.sodium chloride, HYDROcodone-acetaminophen, sodium chloride flush  Micro Results Recent Results (from the past 240 hour(s))  Body fluid culture     Status: None (Preliminary result)  Collection Time: 08/04/17  2:11 PM  Result Value Ref Range Status   Specimen Description   Final    PLEURAL LEFT Performed at [facility], [address], [place], [place] [address]    Special Requests   Final    NONE Performed at [facility], [address] [place], [place] [address]    Gram Stain   Final    ABUNDANT WBC PRESENT, PREDOMINANTLY PMN NO ORGANISMS SEEN    Culture   Final    NO GROWTH < 24 HOURS Performed at [facility], [address] [place], [place] [address]    Report Status PENDING  Incomplete    Radiology Reports Dg Chest 1 View  Result Date: 08/04/2017 CLINICAL DATA:  Status post LEFT thoracentesis. EXAM: CHEST  1 VIEW COMPARISON:  08/04/2017 radiographs FINDINGS: LEFT pleural effusion has decreased in size, now moderate to large. There is no evidence of pneumothorax. RIGHT lung is clear. The cardiomediastinal silhouette is unchanged. IMPRESSION: Decreased LEFT pleural effusion, now moderate to large. No pneumothorax. Electronically Signed   By: Harmon Pier M.D.   On: 08/04/2017 14:39   Dg Chest 2 View  Result Date: 08/04/2017 CLINICAL DATA:  Chest pain, shortness of breath. EXAM: CHEST - 2 VIEW COMPARISON:  None. FINDINGS: The heart size and mediastinal contours are within normal limits. Right lung is clear. No pneumothorax is noted. Interval development of large left pleural effusion is noted with probable underlying atelectasis or infiltrate. The visualized skeletal structures are unremarkable. IMPRESSION: Interval development of large left pleural effusion with probable underlying atelectasis or infiltrate. Electronically Signed   By: Lupita Raider, M.D.   On: 08/04/2017 12:50   Ct Chest W Contrast  Result Date: 08/04/2017 CLINICAL DATA:  Dyspnea with left-sided chest pain on 11 days ago. Thoracentesis earlier today. EXAM: CT CHEST WITH CONTRAST TECHNIQUE: Multidetector CT imaging of the chest was performed during intravenous contrast administration. CONTRAST:  75mL ISOVUE-300 IOPAMIDOL (ISOVUE-300) INJECTION 61% COMPARISON:  Chest radiographs earlier today  FINDINGS: Cardiovascular: Suboptimal contrast bolus. No acute vascular findings are seen. There is mild coronary artery atherosclerosis. The heart size is normal. There is no pericardial effusion. Mediastinum/Nodes: There are no enlarged mediastinal, hilar or axillary lymph nodes.There are scattered small mediastinal lymph nodes. There are also prominent left internal mammary lymph nodes, measuring to 10 mm short axis on image 55/2. The thyroid gland, trachea and esophagus demonstrate no significant findings. Lungs/Pleura: There is a moderate size residual left pleural effusion which is loculated laterally and with associated pleural thickening. No pleural based mass identified on noncontrast imaging. There is no pneumothorax or right-sided pleural effusion. There is compressive left lung atelectasis, especially in the lower lobe. The right lung is well aerated with patchy ground-glass opacities, especially in the upper lobe. No pulmonary nodule or endobronchial lesion seen. Upper abdomen: Borderline hepatic steatosis without apparent focal hepatic abnormality. The adrenal glands appear normal. Portacaval node measuring 17 mm short axis on image 165/2. Musculoskeletal/Chest wall: There is no chest wall mass or suspicious osseous finding. IMPRESSION: 1. Moderate size residual loculated left pleural effusion is suboptimally characterized without contrast, although is associated with some pleural thickening, suggesting a possible exudate. Correlate with thoracentesis results. 2. Resulting compressive left lung atelectasis, especially in the lower lobe. No endobronchial lesion or suspicious nodule within the aerated lungs. 3. Patchy right lung ground-glass opacities (mosaic attenuation), likely related to small airways disease or edema. No evidence of pulmonary embolism  on nondedicated imaging. 4. Enlarged left internal mammary and portacaval lymph nodes are nonspecific and possibly reactive. No mediastinal adenopathy.  Electronically Signed   By: Carey Bullocks M.D.   On: 08/04/2017 16:35   US Thoracentesis Asp Pleural Space W/img Guide  Result Date: 08/04/2017 INDICATION: Patient with history of worsening left-sided chest discomfort, exertional dyspnea, and left-sided pleural effusion. Request is made for diagnostic and therapeutic thoracentesis. EXAM: ULTRASOUND GUIDED DIAGNOSTIC AND THERAPEUTIC LEFT THORACENTESIS MEDICATIONS: 10 milliliters of 1% lidocaine COMPLICATIONS: None immediate. PROCEDURE: An ultrasound guided thoracentesis was thoroughly discussed with the patient and questions answered. The benefits, risks, alternatives and complications were also discussed. The patient understands and wishes to proceed with the procedure. Written consent was obtained. Ultrasound was performed to localize and mark an adequate pocket of fluid in the left chest. The area was then prepped and draped in the normal sterile fashion. 1% Lidocaine was used for local anesthesia. Under ultrasound guidance a 6 Fr Safe-T-Centesis catheter was introduced. Thoracentesis was performed. The catheter was removed and a dressing applied. FINDINGS: A total of approximately 1.8 liters of hazy amber fluid was removed. Samples were sent to the laboratory as requested by the clinical team. IMPRESSION: Successful ultrasound guided left thoracentesis yielding 1.8 liters of pleural fluid. Read by: Elwin Mocha, PA-C Electronically Signed   By: Jolaine Click M.D.   On: 08/04/2017 14:42    Huey Bienenstock M.D on 08/05/2017 at 10:19 AM  Between 7am to 7pm - Pager - [phone]  After 7pm go to [email or web address] - password [facility]  [facility] -  Office  [phone]
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
## 2017-08-05 NOTE — Plan of Care (Signed)
Discussed plan of care with patient.  Patient understands he needs to stop smoking.  Some teach back displayed.

## 2017-08-06 ENCOUNTER — Inpatient Hospital Stay (HOSPITAL_COMMUNITY): Payer: Self-pay

## 2017-08-06 ENCOUNTER — Encounter (HOSPITAL_COMMUNITY): Payer: Self-pay | Admitting: Surgery

## 2017-08-06 ENCOUNTER — Inpatient Hospital Stay (HOSPITAL_COMMUNITY): Payer: Self-pay | Admitting: Anesthesiology

## 2017-08-06 ENCOUNTER — Encounter (HOSPITAL_COMMUNITY)
Admission: EM | Disposition: A | Discharge status: Home / Self Care | Payer: Self-pay | Source: Home / Self Care | Attending: Internal Medicine

## 2017-08-06 HISTORY — PX: VIDEO BRONCHOSCOPY: SHX5072

## 2017-08-06 HISTORY — PX: VIDEO ASSISTED THORACOSCOPY (VATS)/EMPYEMA: SHX6172

## 2017-08-06 HISTORY — PX: DECORTICATION: SHX5101

## 2017-08-06 HISTORY — PX: EMPYEMA DRAINAGE: SHX5097

## 2017-08-06 LAB — COMPREHENSIVE METABOLIC PANEL
ALT: 24 U/L (ref 17–63)
ANION GAP: 13 (ref 5–15)
AST: 35 U/L (ref 15–41)
Albumin: 2.1 g/dL — ABNORMAL LOW (ref 3.5–5.0)
Alkaline Phosphatase: 89 U/L (ref 38–126)
BILIRUBIN TOTAL: 0.9 mg/dL (ref 0.3–1.2)
BUN: 15 mg/dL (ref 6–20)
CO2: 14 mmol/L — ABNORMAL LOW (ref 22–32)
Calcium: 8.4 mg/dL — ABNORMAL LOW (ref 8.9–10.3)
Chloride: 108 mmol/L (ref 101–111)
Creatinine, Ser: 0.79 mg/dL (ref 0.61–1.24)
GFR calc Af Amer: >60 mL/min (ref >60)
GFR calc non Af Amer: >60 mL/min (ref >60)
Glucose, Bld: 115 mg/dL — ABNORMAL HIGH (ref 65–99)
POTASSIUM: 4 mmol/L (ref 3.5–5.1)
Sodium: 135 mmol/L (ref 135–145)
TOTAL PROTEIN: 6 g/dL — AB (ref 6.5–8.1)

## 2017-08-06 LAB — CBC
HEMATOCRIT: 49.4 % (ref 39.0–52.0)
HEMATOCRIT: 51.9 % (ref 39.0–52.0)
HEMOGLOBIN: 17.1 g/dL — AB (ref 13.0–17.0)
Hemoglobin: 18.1 g/dL — ABNORMAL HIGH (ref 13.0–17.0)
MCH: 35.1 pg — AB (ref 26.0–34.0)
MCH: 35.2 pg — AB (ref 26.0–34.0)
MCHC: 34.6 g/dL (ref 30.0–36.0)
MCHC: 34.9 g/dL (ref 30.0–36.0)
MCV: 101.4 fL — AB (ref 78.0–100.0)
MCV: 101 fL — AB (ref 78.0–100.0)
Platelets: 303 10*3/uL (ref 150–400)
Platelets: 308 10*3/uL (ref 150–400)
RBC: 4.87 MIL/uL (ref 4.22–5.81)
RBC: 5.14 MIL/uL (ref 4.22–5.81)
RDW: 13.7 % (ref 11.5–15.5)
RDW: 13.9 % (ref 11.5–15.5)
WBC: 9 10*3/uL (ref 4.0–10.5)
WBC: 9.5 10*3/uL (ref 4.0–10.5)

## 2017-08-06 LAB — PROTIME-INR
INR: 1.14
Prothrombin Time: 14.5 seconds (ref 11.4–15.2)

## 2017-08-06 LAB — BASIC METABOLIC PANEL
Anion gap: 12 (ref 5–15)
BUN: 15 mg/dL (ref 6–20)
CHLORIDE: 104 mmol/L (ref 101–111)
CO2: 22 mmol/L (ref 22–32)
CREATININE: 0.93 mg/dL (ref 0.61–1.24)
Calcium: 8.5 mg/dL — ABNORMAL LOW (ref 8.9–10.3)
GFR calc Af Amer: >60 mL/min (ref >60)
GFR calc non Af Amer: >60 mL/min (ref >60)
Glucose, Bld: 107 mg/dL — ABNORMAL HIGH (ref 65–99)
Potassium: 3.9 mmol/L (ref 3.5–5.1)
Sodium: 138 mmol/L (ref 135–145)

## 2017-08-06 LAB — BLOOD GAS, ARTERIAL
ACID-BASE DEFICIT: 7.7 mmol/L — AB (ref 0.0–2.0)
BICARBONATE: 15.8 mmol/L — AB (ref 20.0–28.0)
FIO2: 21
O2 Saturation: 95.6 %
PCO2 ART: 24.5 mmHg — AB (ref 32.0–48.0)
Patient temperature: 98.6
pH, Arterial: 7.427 (ref 7.350–7.450)
pO2, Arterial: 81 mmHg — ABNORMAL LOW (ref 83.0–108.0)

## 2017-08-06 LAB — URINALYSIS, ROUTINE W REFLEX MICROSCOPIC
BACTERIA UA: NONE SEEN
Glucose, UA: NEGATIVE mg/dL
KETONES UR: NEGATIVE mg/dL
LEUKOCYTES UA: NEGATIVE
Nitrite: NEGATIVE
PH: 6 (ref 5.0–8.0)
Protein, ur: NEGATIVE mg/dL
RBC / HPF: >50 RBC/hpf — ABNORMAL HIGH (ref 0–5)
Specific Gravity, Urine: 1.026 (ref 1.005–1.030)

## 2017-08-06 LAB — MRSA PCR SCREENING: MRSA by PCR: NEGATIVE

## 2017-08-06 LAB — APTT: aPTT: 40 seconds — ABNORMAL HIGH (ref 24–36)

## 2017-08-06 SURGERY — BRONCHOSCOPY, VIDEO-ASSISTED
Anesthesia: General | Site: Chest

## 2017-08-06 MED ORDER — DIPHENHYDRAMINE HCL 12.5 MG/5ML PO ELIX
12.5000 mg | ORAL_SOLUTION | Freq: Four times a day (QID) | ORAL | Status: DC | PRN
  Filled 2017-08-06: qty 5

## 2017-08-06 MED ORDER — HYDROMORPHONE HCL 2 MG/ML IJ SOLN
INTRAMUSCULAR | Status: AC
  Filled 2017-08-06: qty 1

## 2017-08-06 MED ORDER — PROPOFOL 10 MG/ML IV BOLUS
INTRAVENOUS | Status: AC
  Filled 2017-08-06: qty 20

## 2017-08-06 MED ORDER — LEVALBUTEROL HCL 0.63 MG/3ML IN NEBU
0.6300 mg | INHALATION_SOLUTION | Freq: Four times a day (QID) | RESPIRATORY_TRACT | Status: DC
  Administered 2017-08-06: 0.63 mg via RESPIRATORY_TRACT
  Filled 2017-08-06: qty 3

## 2017-08-06 MED ORDER — BISACODYL 5 MG PO TBEC
10.0000 mg | DELAYED_RELEASE_TABLET | Freq: Every day | ORAL | Status: DC
  Administered 2017-08-06 – 2017-08-14 (×8): 10 mg via ORAL
  Filled 2017-08-06 (×9): qty 2

## 2017-08-06 MED ORDER — LORAZEPAM 0.5 MG PO TABS
0.5000 mg | ORAL_TABLET | Freq: Once | ORAL | Status: AC
  Administered 2017-08-06: 0.5 mg via ORAL
  Filled 2017-08-06: qty 1

## 2017-08-06 MED ORDER — FENTANYL CITRATE (PF) 250 MCG/5ML IJ SOLN
INTRAMUSCULAR | Status: AC
  Filled 2017-08-06: qty 5

## 2017-08-06 MED ORDER — DIPHENHYDRAMINE HCL 50 MG/ML IJ SOLN
12.5000 mg | Freq: Four times a day (QID) | INTRAMUSCULAR | Status: DC | PRN
  Administered 2017-08-09: 12.5 mg via INTRAVENOUS
  Filled 2017-08-06: qty 1

## 2017-08-06 MED ORDER — SENNOSIDES-DOCUSATE SODIUM 8.6-50 MG PO TABS: 1.0000 | ORAL_TABLET | Freq: Every day | ORAL | Status: DC

## 2017-08-06 MED ORDER — ACETAMINOPHEN 500 MG PO TABS
1000.0000 mg | ORAL_TABLET | Freq: Four times a day (QID) | ORAL | Status: AC
  Administered 2017-08-06 – 2017-08-11 (×11): 1000 mg via ORAL
  Filled 2017-08-06 (×14): qty 2

## 2017-08-06 MED ORDER — TRAMADOL HCL 50 MG PO TABS
50.0000 mg | ORAL_TABLET | Freq: Four times a day (QID) | ORAL | Status: DC | PRN
  Administered 2017-08-10: 100 mg via ORAL
  Administered 2017-08-10: 50 mg via ORAL
  Filled 2017-08-06: qty 1
  Filled 2017-08-06: qty 2

## 2017-08-06 MED ORDER — DEXAMETHASONE SODIUM PHOSPHATE 4 MG/ML IJ SOLN
INTRAMUSCULAR | Status: DC | PRN
  Administered 2017-08-06: 10 mg via INTRAVENOUS

## 2017-08-06 MED ORDER — AMPICILLIN-SULBACTAM SODIUM 3 (2-1) G IJ SOLR
3.0000 g | Freq: Four times a day (QID) | INTRAMUSCULAR | Status: DC
  Administered 2017-08-06 – 2017-08-15 (×34): 3 g via INTRAVENOUS
  Filled 2017-08-06 (×38): qty 3

## 2017-08-06 MED ORDER — ROCURONIUM BROMIDE 100 MG/10ML IV SOLN
INTRAVENOUS | Status: DC | PRN
  Administered 2017-08-06: 30 mg via INTRAVENOUS
  Administered 2017-08-06: 50 mg via INTRAVENOUS
  Administered 2017-08-06: 20 mg via INTRAVENOUS

## 2017-08-06 MED ORDER — LEVALBUTEROL HCL 0.63 MG/3ML IN NEBU: 0.6300 mg | INHALATION_SOLUTION | Freq: Four times a day (QID) | RESPIRATORY_TRACT | Status: DC | PRN

## 2017-08-06 MED ORDER — MIDAZOLAM HCL 2 MG/2ML IJ SOLN
INTRAMUSCULAR | Status: AC
  Filled 2017-08-06: qty 2

## 2017-08-06 MED ORDER — POTASSIUM CHLORIDE 10 MEQ/50ML IV SOLN: 10.0000 meq | Freq: Every day | INTRAVENOUS | Status: DC | PRN

## 2017-08-06 MED ORDER — ACETAMINOPHEN 160 MG/5ML PO SOLN
1000.0000 mg | Freq: Four times a day (QID) | ORAL | Status: AC
  Filled 2017-08-06: qty 40.6

## 2017-08-06 MED ORDER — NALOXONE HCL 0.4 MG/ML IJ SOLN
0.4000 mg | INTRAMUSCULAR | Status: DC | PRN
  Filled 2017-08-06: qty 1

## 2017-08-06 MED ORDER — MIDAZOLAM HCL 5 MG/5ML IJ SOLN
INTRAMUSCULAR | Status: DC | PRN
  Administered 2017-08-06: 2 mg via INTRAVENOUS

## 2017-08-06 MED ORDER — OXYCODONE HCL 5 MG PO TABS
ORAL_TABLET | ORAL | Status: AC
  Filled 2017-08-06: qty 2

## 2017-08-06 MED ORDER — SENNOSIDES-DOCUSATE SODIUM 8.6-50 MG PO TABS
2.0000 | ORAL_TABLET | Freq: Two times a day (BID) | ORAL | Status: DC
  Administered 2017-08-06 – 2017-08-14 (×13): 2 via ORAL
  Filled 2017-08-06 (×13): qty 2

## 2017-08-06 MED ORDER — HYDROMORPHONE HCL 2 MG/ML IJ SOLN
0.2500 mg | INTRAMUSCULAR | Status: DC | PRN
  Administered 2017-08-06 (×2): 0.5 mg via INTRAVENOUS

## 2017-08-06 MED ORDER — METOCLOPRAMIDE HCL 5 MG/ML IJ SOLN
10.0000 mg | Freq: Four times a day (QID) | INTRAMUSCULAR | Status: AC
  Administered 2017-08-06 – 2017-08-07 (×3): 10 mg via INTRAVENOUS
  Filled 2017-08-06 (×3): qty 2

## 2017-08-06 MED ORDER — FENTANYL 40 MCG/ML IV SOLN
INTRAVENOUS | Status: DC
  Administered 2017-08-06: 1000 ug via INTRAVENOUS
  Administered 2017-08-06: 45 ug via INTRAVENOUS
  Administered 2017-08-06: 75 ug via INTRAVENOUS
  Administered 2017-08-07: 105 ug via INTRAVENOUS
  Administered 2017-08-07: 30 ug via INTRAVENOUS
  Administered 2017-08-07 (×2): 135 ug via INTRAVENOUS
  Administered 2017-08-07 (×2): 15 ug via INTRAVENOUS
  Administered 2017-08-08: 1000 ug via INTRAVENOUS
  Administered 2017-08-08: 60 ug via INTRAVENOUS
  Administered 2017-08-09: 90 ug via INTRAVENOUS
  Administered 2017-08-09: 45 ug via INTRAVENOUS
  Administered 2017-08-09 (×2): 30 ug via INTRAVENOUS
  Administered 2017-08-10: 15 ug via INTRAVENOUS
  Administered 2017-08-10: 90 ug via INTRAVENOUS
  Administered 2017-08-10: 60 ug via INTRAVENOUS
  Administered 2017-08-10: 120 ug via INTRAVENOUS
  Administered 2017-08-10: 75 ug via INTRAVENOUS
  Administered 2017-08-10: 30 ug via INTRAVENOUS
  Administered 2017-08-11: 45 ug via INTRAVENOUS
  Administered 2017-08-11: 15 ug via INTRAVENOUS
  Administered 2017-08-11: 30 ug via INTRAVENOUS
  Administered 2017-08-11: 1000 ug via INTRAVENOUS
  Administered 2017-08-12: 30 ug via INTRAVENOUS
  Administered 2017-08-12: 45 ug via INTRAVENOUS
  Administered 2017-08-12: 60 ug via INTRAVENOUS
  Administered 2017-08-12: 75 ug via INTRAVENOUS
  Administered 2017-08-12: 60 ug via INTRAVENOUS
  Administered 2017-08-12: 90 ug via INTRAVENOUS
  Administered 2017-08-13: 15 ug via INTRAVENOUS
  Administered 2017-08-13: 75 ug via INTRAVENOUS
  Filled 2017-08-06 (×5): qty 25

## 2017-08-06 MED ORDER — LIDOCAINE HCL (CARDIAC) PF 100 MG/5ML IV SOSY
PREFILLED_SYRINGE | INTRAVENOUS | Status: DC | PRN
  Administered 2017-08-06: 60 mg via INTRAVENOUS

## 2017-08-06 MED ORDER — SODIUM CHLORIDE 0.45 % IV SOLN
INTRAVENOUS | Status: DC
  Administered 2017-08-06: 22:00:00 via INTRAVENOUS

## 2017-08-06 MED ORDER — SODIUM CHLORIDE 0.9% FLUSH: 9.0000 mL | INTRAVENOUS | Status: DC | PRN

## 2017-08-06 MED ORDER — SUGAMMADEX SODIUM 200 MG/2ML IV SOLN
INTRAVENOUS | Status: DC | PRN
  Administered 2017-08-06: 250 mg via INTRAVENOUS

## 2017-08-06 MED ORDER — ONDANSETRON HCL 4 MG/2ML IJ SOLN
INTRAMUSCULAR | Status: DC | PRN
  Administered 2017-08-06: 4 mg via INTRAVENOUS

## 2017-08-06 MED ORDER — LACTATED RINGERS IV SOLN
INTRAVENOUS | Status: DC | PRN
  Administered 2017-08-06 (×2): via INTRAVENOUS

## 2017-08-06 MED ORDER — OXYCODONE HCL 5 MG PO TABS
5.0000 mg | ORAL_TABLET | ORAL | Status: DC | PRN
  Administered 2017-08-06 – 2017-08-15 (×11): 10 mg via ORAL
  Filled 2017-08-06 (×10): qty 2

## 2017-08-06 MED ORDER — CEFAZOLIN SODIUM-DEXTROSE 2-4 GM/100ML-% IV SOLN
2.0000 g | INTRAVENOUS | Status: AC
  Administered 2017-08-06: 2 g via INTRAVENOUS
  Filled 2017-08-06: qty 100

## 2017-08-06 MED ORDER — ONDANSETRON HCL 4 MG/2ML IJ SOLN: 4.0000 mg | Freq: Four times a day (QID) | INTRAMUSCULAR | Status: DC | PRN

## 2017-08-06 MED ORDER — ENOXAPARIN SODIUM 40 MG/0.4ML ~~LOC~~ SOLN
40.0000 mg | SUBCUTANEOUS | Status: DC
  Filled 2017-08-06 (×3): qty 0.4

## 2017-08-06 MED ORDER — FENTANYL CITRATE (PF) 100 MCG/2ML IJ SOLN
INTRAMUSCULAR | Status: DC | PRN
  Administered 2017-08-06: 50 ug via INTRAVENOUS
  Administered 2017-08-06: 100 ug via INTRAVENOUS
  Administered 2017-08-06: 150 ug via INTRAVENOUS
  Administered 2017-08-06 (×2): 50 ug via INTRAVENOUS

## 2017-08-06 MED ORDER — 0.9 % SODIUM CHLORIDE (POUR BTL) OPTIME
TOPICAL | Status: DC | PRN
  Administered 2017-08-06 (×3): 1000 mL

## 2017-08-06 MED ORDER — PROPOFOL 10 MG/ML IV BOLUS
INTRAVENOUS | Status: DC | PRN
  Administered 2017-08-06: 150 mg via INTRAVENOUS

## 2017-08-06 MED ORDER — LEVALBUTEROL HCL 0.63 MG/3ML IN NEBU
0.6300 mg | INHALATION_SOLUTION | Freq: Three times a day (TID) | RESPIRATORY_TRACT | Status: DC
  Administered 2017-08-07 – 2017-08-08 (×5): 0.63 mg via RESPIRATORY_TRACT
  Filled 2017-08-06 (×6): qty 3

## 2017-08-06 SURGICAL SUPPLY — 90 items
ADH SKN CLS APL DERMABOND .7 (GAUZE/BANDAGES/DRESSINGS)
APL SRG 22X2 LUM MLBL SLNT (VASCULAR PRODUCTS)
APL SRG 7X2 LUM MLBL SLNT (VASCULAR PRODUCTS)
APPLICATOR TIP COSEAL (VASCULAR PRODUCTS) IMPLANT
APPLICATOR TIP EXT COSEAL (VASCULAR PRODUCTS) IMPLANT
BLADE SURG 11 STRL SS (BLADE) ×3 IMPLANT
BRUSH CYTOL CELLEBRITY 1.5X140 (MISCELLANEOUS) IMPLANT
CANISTER SUCT 3000ML PPV (MISCELLANEOUS) ×4 IMPLANT
CATH KIT ON Q 5IN SLV (PAIN MANAGEMENT) IMPLANT
CATH THORACIC 28FR (CATHETERS) IMPLANT
CATH THORACIC 36FR (CATHETERS) IMPLANT
CATH THORACIC 36FR RT ANG (CATHETERS) IMPLANT
CLEANER TIP ELECTROSURG 2X2 (MISCELLANEOUS) ×4 IMPLANT
CLIP VESOCCLUDE MED 6/CT (CLIP) IMPLANT
CONN ST 1/4X3/8  BEN (MISCELLANEOUS) ×1
CONN ST 1/4X3/8 BEN (MISCELLANEOUS) IMPLANT
CONN Y 3/8X3/8X3/8  BEN (MISCELLANEOUS)
CONN Y 3/8X3/8X3/8 BEN (MISCELLANEOUS) IMPLANT
CONT SPEC 4OZ CLIKSEAL STRL BL (MISCELLANEOUS) ×8 IMPLANT
COVER BACK TABLE 60X90IN (DRAPES) ×4 IMPLANT
COVER SURGICAL LIGHT HANDLE (MISCELLANEOUS) ×4 IMPLANT
DERMABOND ADVANCED (GAUZE/BANDAGES/DRESSINGS)
DERMABOND ADVANCED .7 DNX12 (GAUZE/BANDAGES/DRESSINGS) IMPLANT
DRAIN CHANNEL 28F RND 3/8 FF (WOUND CARE) IMPLANT
DRAPE LAPAROSCOPIC ABDOMINAL (DRAPES) ×4 IMPLANT
DRAPE WARM FLUID 44X44 (DRAPE) ×3 IMPLANT
DRILL BIT 7/64X5 (BIT) IMPLANT
ELECT BLADE 4.0 EZ CLEAN MEGAD (MISCELLANEOUS) ×4
ELECT REM PT RETURN 9FT ADLT (ELECTROSURGICAL) ×4
ELECTRODE BLDE 4.0 EZ CLN MEGD (MISCELLANEOUS) ×3 IMPLANT
ELECTRODE REM PT RTRN 9FT ADLT (ELECTROSURGICAL) ×3 IMPLANT
FORCEPS BIOP RJ4 1.8 (CUTTING FORCEPS) IMPLANT
GAUZE SPONGE 4X4 12PLY STRL (GAUZE/BANDAGES/DRESSINGS) ×5 IMPLANT
GAUZE SPONGE 4X4 12PLY STRL LF (GAUZE/BANDAGES/DRESSINGS) ×1 IMPLANT
GLOVE BIO SURGEON STRL SZ 6.5 (GLOVE) ×8 IMPLANT
GLOVE BIOGEL PI IND STRL 6.5 (GLOVE) IMPLANT
GLOVE BIOGEL PI INDICATOR 6.5 (GLOVE) ×1
GLOVE SURG SS PI 6.5 STRL IVOR (GLOVE) ×1 IMPLANT
GOWN STRL REUS W/ TWL LRG LVL3 (GOWN DISPOSABLE) ×9 IMPLANT
GOWN STRL REUS W/TWL LRG LVL3 (GOWN DISPOSABLE) ×12
KIT BASIN OR (CUSTOM PROCEDURE TRAY) ×4 IMPLANT
KIT CLEAN ENDO COMPLIANCE (KITS) ×4 IMPLANT
KIT SUCTION CATH 14FR (SUCTIONS) ×5 IMPLANT
KIT TURNOVER KIT B (KITS) ×4 IMPLANT
MARKER SKIN DUAL TIP RULER LAB (MISCELLANEOUS) IMPLANT
NS IRRIG 1000ML POUR BTL (IV SOLUTION) ×17 IMPLANT
OIL SILICONE PENTAX (PARTS (SERVICE/REPAIRS)) ×4 IMPLANT
PACK CHEST (CUSTOM PROCEDURE TRAY) ×4 IMPLANT
PAD ARMBOARD 7.5X6 YLW CONV (MISCELLANEOUS) ×8 IMPLANT
PASSER SUT SWANSON 36MM LOOP (INSTRUMENTS) IMPLANT
SCISSORS LAP 5X35 DISP (ENDOMECHANICALS) IMPLANT
SEALANT PROGEL (MISCELLANEOUS) IMPLANT
SEALANT SURG COSEAL 4ML (VASCULAR PRODUCTS) IMPLANT
SEALANT SURG COSEAL 8ML (VASCULAR PRODUCTS) IMPLANT
SET MICROPUNCTURE 5F STIFF (MISCELLANEOUS) ×1 IMPLANT
SOLUTION ANTI FOG 6CC (MISCELLANEOUS) ×4 IMPLANT
SUT PROLENE 3 0 SH DA (SUTURE) IMPLANT
SUT PROLENE 4 0 RB 1 (SUTURE)
SUT PROLENE 4-0 RB1 .5 CRCL 36 (SUTURE) IMPLANT
SUT SILK  1 MH (SUTURE) ×5
SUT SILK 1 MH (SUTURE) ×12 IMPLANT
SUT SILK 1 TIES 10X30 (SUTURE) IMPLANT
SUT SILK 2 0SH CR/8 30 (SUTURE) IMPLANT
SUT SILK 3 0SH CR/8 30 (SUTURE) IMPLANT
SUT VIC AB 1 CTX 18 (SUTURE) ×1 IMPLANT
SUT VIC AB 1 CTX 36 (SUTURE) ×8
SUT VIC AB 1 CTX36XBRD ANBCTR (SUTURE) IMPLANT
SUT VIC AB 2-0 CTX 36 (SUTURE) ×1 IMPLANT
SUT VIC AB 3-0 X1 27 (SUTURE) ×1 IMPLANT
SUT VICRYL 0 UR6 27IN ABS (SUTURE) IMPLANT
SUT VICRYL 2 TP 1 (SUTURE) IMPLANT
SWAB COLLECTION DEVICE MRSA (MISCELLANEOUS) IMPLANT
SWAB CULTURE ESWAB REG 1ML (MISCELLANEOUS) IMPLANT
SYR 20ML ECCENTRIC (SYRINGE) ×4 IMPLANT
SYSTEM SAHARA CHEST DRAIN ATS (WOUND CARE) ×4 IMPLANT
TAPE CLOTH 4X10 WHT NS (GAUZE/BANDAGES/DRESSINGS) ×4 IMPLANT
TAPE CLOTH SURG 4X10 WHT LF (GAUZE/BANDAGES/DRESSINGS) ×1 IMPLANT
TAPE UMBILICAL COTTON 1/8X30 (MISCELLANEOUS) ×4 IMPLANT
TIP APPLICATOR SPRAY EXTEND 16 (VASCULAR PRODUCTS) IMPLANT
TOWEL GREEN STERILE (TOWEL DISPOSABLE) ×4 IMPLANT
TOWEL GREEN STERILE FF (TOWEL DISPOSABLE) ×4 IMPLANT
TRAP SPECIMEN MUCOUS 40CC (MISCELLANEOUS) ×2 IMPLANT
TRAY FOLEY CATH SILVER 16FR LF (SET/KITS/TRAYS/PACK) ×4 IMPLANT
TROCAR BLADELESS 12MM (ENDOMECHANICALS) ×3 IMPLANT
TROCAR XCEL NON-BLD 11X100MML (ENDOMECHANICALS) ×1 IMPLANT
TROCAR XCEL NON-BLD 5MMX100MML (ENDOMECHANICALS) ×1 IMPLANT
TUBE CONNECTING 20X1/4 (TUBING) ×4 IMPLANT
TUNNELER SHEATH ON-Q 11GX8 DSP (PAIN MANAGEMENT) IMPLANT
VALVE DISPOSABLE (MISCELLANEOUS) ×4 IMPLANT
WATER STERILE IRR 1000ML POUR (IV SOLUTION) ×8 IMPLANT

## 2017-08-06 NOTE — Progress Notes (Signed)
Patient ID: Bryce Mendoza Ringgenberg, male   DOB: 06-Mar-1960, 57 y.o.   MRN: [id number] Pre Procedure note for inpatients:   Bryce Mendoza Supan has been scheduled for Procedure(s): VIDEO BRONCHOSCOPY (N/A) VIDEO ASSISTED THORACOSCOPY (VATS)/EMPYEMA (Left) today. The various methods of treatment have been discussed with the patient. After consideration of the risks, benefits and treatment options the patient has consented to the planned procedure.   The patient has been seen and labs reviewed. There are no changes in the patient's condition to prevent proceeding with the planned procedure today.  Recent labs:  Lab Results  Component Value Date   WBC 9.5 08/06/2017   HGB 18.1 (H) 08/06/2017   HCT 51.9 08/06/2017   PLT 308 08/06/2017   GLUCOSE 115 (H) 08/06/2017   ALT 24 08/06/2017   AST 35 08/06/2017   NA 135 08/06/2017   K 4.0 08/06/2017   CL 108 08/06/2017   CREATININE 0.79 08/06/2017   BUN 15 08/06/2017   CO2 14 (L) 08/06/2017   INR 1.14 08/06/2017   Patient was previously seen by Dr. on for large left pleural effusion question of edema, thoracentesis was performed.  However chest x-ray this morning shows persistent large left pleural effusion, total protein in the fluid was 4.4 glucose less than 20.  Gram stain showed multiple white cells no bacteria.  Cytology was negative.  Patient ate breakfast this morning so drainage was delayed until this afternoon.  I discussed with the patient the risks and options of proceeding with definitive drainage of the left effusion/early empyema.  We will plan bronchoscopy left video-assisted thoracoscopy and pleural biopsies as appropriate.  Patient is agreeable to proceed  The goals risks and alternatives of the planned surgical procedure Procedure(s): VIDEO BRONCHOSCOPY (N/A) VIDEO ASSISTED THORACOSCOPY (VATS)/EMPYEMA (Left)  have been discussed with the patient in detail. The risks of the procedure including death, infection, stroke, myocardial infarction,  bleeding, blood transfusion have all been discussed specifically.  I have quoted Bryce LingoEric Dowe a 1 % of perioperative mortality and a complication rate as high as 30 %. The patient's questions have been answered.Bryce Mendoza Hanshaw is willing  to proceed with the planned procedure.   Delight OvensEdward B Darelle Kings, MD 08/06/2017 4:49 PM

## 2017-08-06 NOTE — Anesthesia Procedure Notes (Signed)
Arterial Line Insertion Start/End6/08/2017 4:30 AM, 08/06/2017 4:50 AM Performed by: Tillman AbideHawkins, Brocha Gilliam B, CRNA, CRNA  Preanesthetic checklist: patient identified, IV checked, site marked, risks and benefits discussed, surgical consent, monitors and equipment checked, pre-op evaluation, timeout performed and anesthesia consent Lidocaine 1% used for infiltration radial was placed Catheter size: 20 G Hand hygiene performed , maximum sterile barriers used  and Seldinger technique used Allen's test indicative of satisfactory collateral circulation Attempts: 2 Procedure performed without using ultrasound guided technique. Post procedure assessment: normal  Patient tolerated the procedure well with no immediate complications. Additional procedure comments: Unable to place arterial line after attempting both rt and left radial line.  Plan for Brachial once asleep.  .[name]

## 2017-08-06 NOTE — Care Management Note (Signed)
Case Management Note  Patient Details  Name: Bryce Mendoza MRN: [id number] Date of Birth: 1960/11/30  Subjective/Objective:     From home alone, presents with pl effusion, empyema, s/p thoracentesis, CVTS consulted, plan for VATS 6/7. Patient with no insurance or PCP, has follow up apt at the [facility] for 6/13 at 2:30.  He may need Match Letter at discharge for medication assistance.                Action/Plan: NCM will follow for dc needs, plan for VATS 6/7.  Expected Discharge Date:  (unknown)               Expected Discharge Plan:  Home/Self Care  In-House Referral:     Discharge planning Services  CM Consult, Follow-up appt scheduled, Medication Assistance  Post Acute Care Choice:    Choice offered to:     DME Arranged:    DME Agency:     HH Arranged:    HH Agency:     Status of Service:  In process, will continue to follow  If discussed at Long Length of Stay Meetings, dates discussed:    Additional Comments:  [name], Sabrina Keough Clinton, RN 08/06/2017, 10:07 AM

## 2017-08-06 NOTE — Transfer of Care (Signed)
Immediate Anesthesia Transfer of Care Note  Patient: Bryce Mendoza  Procedure(s) Performed: VIDEO BRONCHOSCOPY (N/A ) VIDEO ASSISTED THORACOSCOPY (VATS)/EMPYEMA, MINI THORACOTOMY (Left Chest) EMPYEMA DRAINAGE DECORTICATION  Patient Location: PACU  Anesthesia Type:General  Level of Consciousness: awake, oriented, drowsy and patient cooperative  Airway & Oxygen Therapy: Patient Spontanous Breathing and Patient connected to nasal cannula oxygen  Post-op Assessment: Report given to RN and Post -op Vital signs reviewed and stable  Post vital signs: Reviewed and stable  Last Vitals:  Vitals Value Taken Time  BP 113/62 08/06/2017  7:37 PM  Temp    Pulse 79 08/06/2017  7:45 PM  Resp 22 08/06/2017  7:45 PM  SpO2 97 % 08/06/2017  7:45 PM  Vitals shown include unvalidated device data.  Last Pain:  Vitals:   08/06/17 0910  TempSrc:   PainSc: 9       Patients Stated Pain Goal: 0 (08/06/17 0910)  Complications: No apparent anesthesia complications

## 2017-08-06 NOTE — Anesthesia Procedure Notes (Signed)
Procedure Name: Intubation Date/Time: 08/06/2017 5:39 PM Performed by: Oletta Lamas, CRNA Pre-anesthesia Checklist: Patient identified, Emergency Drugs available, Suction available and Patient being monitored Patient Re-evaluated:Patient Re-evaluated prior to induction Oxygen Delivery Method: Circle System Utilized Preoxygenation: Pre-oxygenation with 100% oxygen Induction Type: IV induction Ventilation: Mask ventilation without difficulty Laryngoscope Size: Mac and 4 Grade View: Grade I Tube type: Oral Endobronchial tube: Left, Double lumen EBT and EBT position confirmed by auscultation and 41 Fr Number of attempts: 1 Airway Equipment and Method: Stylet and Oral airway Placement Confirmation: ETT inserted through vocal cords under direct vision,  positive ETCO2 and breath sounds checked- equal and bilateral Secured at: 31 cm Tube secured with: Tape Dental Injury: Teeth and Oropharynx as per pre-operative assessment  Comments: Vivasight DL tube used.  Visualiation of bronchial cuff just below carina

## 2017-08-06 NOTE — Plan of Care (Signed)
Discussed plan of care with patient.  Patient is anxious about upcoming test results.  Some teach back was displayed.

## 2017-08-06 NOTE — Brief Op Note (Addendum)
      [address]       [place], [id number]             [phone]      08/06/2017  7:14 PM  PATIENT:  Bryce Mendoza  57 y.o. male  PRE-OPERATIVE DIAGNOSIS:  empyema  POST-OPERATIVE DIAGNOSIS:  Empyema  PROCEDURE:  Procedure(s): VIDEO BRONCHOSCOPY (N/A) VIDEO ASSISTED THORACOSCOPY (VATS) -Drainage of Empyema -Decortication on Lung  SURGEON:  Surgeon(s) and Role:    * Delight OvensGerhardt, Exilda Wilhite B, MD - Primary  PHYSICIAN ASSISTANT: Lowella DandyErin Barrett PA-C  ANESTHESIA:   general  EBL:  50 mL   BLOOD ADMINISTERED:none  DRAINS: Barbaraann BoysBlake Drain, Straight Chest Tube   LOCAL MEDICATIONS USED:  NONE  SPECIMEN:  Source of Specimen:  Pleural Fluid, Pleural Peel  DISPOSITION OF SPECIMEN:  PATHOLOGY  COUNTS:  YES   DICTATION: .Dragon Dictation  PLAN OF CARE: Patient has active admission order  PATIENT DISPOSITION:  PACU - hemodynamically stable.   Delay start of Pharmacological VTE agent (>24hrs) due to surgical blood loss or risk of bleeding: yes

## 2017-08-06 NOTE — Progress Notes (Signed)
      [address]       [place] [id number]             [phone]     CARDIOTHORACIC SURGERY PROGRESS NOTE  Subjective: Increased discomfort in chest overnight.  No SOB.  No fevers.  Just finished eating breakfast  Objective: Vital signs in last 24 hours: Temp:  [97.6 F (36.4 C)-98.3 F (36.8 C)] 98.3 F (36.8 C) (06/06 0728) Pulse Rate:  [76-81] 81 (06/06 0728) Resp:  [18-20] 18 (06/06 0728) BP: (116-122)/(81-91) 116/81 (06/06 0728) SpO2:  [83 %-98 %] 94 % (06/06 0728)  Physical Exam:  Rhythm:   sinus  Breath sounds: Diminished on left  Heart sounds:  RRR  Incisions:  n/a  Abdomen:  Soft, non-distended, non-tender  Extremities:  Warm, well-perfused    Intake/Output from previous day: 06/05 0701 - 06/06 0700 In: 480 [P.O.:480] Out: -  Intake/Output this shift: No intake/output data recorded.  Lab Results: Recent Labs    08/05/17 0741 08/06/17 0523  WBC 10.4 9.0  HGB 18.1* 17.1*  HCT 51.5 49.4  PLT 279 303   BMET:  Recent Labs    08/05/17 0741 08/06/17 0523  NA 136 138  K 3.1* 3.9  CL 100* 104  CO2 22 22  GLUCOSE 119* 107*  BUN 18 15  CREATININE 1.07 0.93  CALCIUM 8.8* 8.5*    CBG (last 3)  No results for input(s): GLUCAP in the last 72 hours. PT/INR:   Recent Labs    08/05/17 0741  LABPROT 15.4*  INR 1.23    CXR:  Unchanged/slightly worse.  Moderate-large residual pleural effusion  Assessment/Plan:  Pleural fluid cytology negative and all findings c/w likely empyema.  Options again discussed w/ patient at bedside.  He would likely best be treated with VATS for definitive drainage.  Unfortunately, patient just ate breakfast.  Will attempt to schedule for tomorrow, but I will be out and ask one of my partners to cover.   I spent in excess of 15 minutes during the conduct of this hospital encounter and >50% of this time involved direct face-to-face encounter with the patient for counseling and/or coordination of their  care.    Purcell Nailslarence H Jenavieve Freda, MD 08/06/2017 8:11 AM

## 2017-08-06 NOTE — Anesthesia Preprocedure Evaluation (Addendum)
Anesthesia Evaluation  Patient identified by MRN, date of birth, ID band Patient awake    Reviewed: Allergy & Precautions, H&P , NPO status , Patient's Chart, lab work & pertinent test results  Airway Mallampati: II  TM Distance: >3 FB Neck ROM: Full    Dental no notable dental hx. (+) Teeth Intact, Dental Advisory Given   Pulmonary Current Smoker,    Pulmonary exam normal breath sounds clear to auscultation       Cardiovascular negative cardio ROS   Rhythm:Regular Rate:Normal     Neuro/Psych negative neurological ROS  negative psych ROS   GI/Hepatic negative GI ROS, Neg liver ROS,   Endo/Other  negative endocrine ROS  Renal/GU negative Renal ROS  negative genitourinary   Musculoskeletal   Abdominal   Peds  Hematology negative hematology ROS (+)   Anesthesia Other Findings   Reproductive/Obstetrics negative OB ROS                            Anesthesia Physical Anesthesia Plan  ASA: II  Anesthesia Plan: General   Post-op Pain Management:    Induction: Intravenous  PONV Risk Score and Plan: 2 and Ondansetron, Dexamethasone and Midazolam  Airway Management Planned: Double Lumen EBT  Additional Equipment: Arterial line  Intra-op Plan:   Post-operative Plan: Extubation in OR and Possible Post-op intubation/ventilation  Informed Consent: I have reviewed the patients History and Physical, chart, labs and discussed the procedure including the risks, benefits and alternatives for the proposed anesthesia with the patient or authorized representative who has indicated his/her understanding and acceptance.   Dental advisory given  Plan Discussed with: CRNA  Anesthesia Plan Comments:         Anesthesia Quick Evaluation

## 2017-08-06 NOTE — Anesthesia Procedure Notes (Addendum)
Arterial Line Insertion Start/End6/08/2017 5:30 PM, 08/06/2017 5:40 PM Performed by: Gaynelle AduFitzgerald, Keniesha Adderly, MD, anesthesiologist  Patient location: OR. Preanesthetic checklist: patient identified, IV checked, site marked, risks and benefits discussed, surgical consent, monitors and equipment checked, pre-op evaluation, timeout performed and anesthesia consent Lidocaine 1% used for infiltration Right, brachial was placed Catheter size: 20 G Hand hygiene performed  and maximum sterile barriers used   Attempts: 1 Procedure performed using ultrasound guided technique. Ultrasound Notes:anatomy identified, needle tip was noted to be adjacent to the nerve/plexus identified, no ultrasound evidence of intravascular and/or intraneural injection and image(s) printed for medical record Following insertion, dressing applied and Biopatch. Post procedure assessment: normal and unchanged  Patient tolerated the procedure well with no immediate complications.

## 2017-08-06 NOTE — Anesthesia Procedure Notes (Signed)
Procedure Name: Intubation Date/Time: 08/06/2017 2:12 PM Performed by: Oletta Lamas, CRNA Pre-anesthesia Checklist: Patient identified, Emergency Drugs available, Suction available and Patient being monitored Patient Re-evaluated:Patient Re-evaluated prior to induction Oxygen Delivery Method: Circle System Utilized Preoxygenation: Pre-oxygenation with 100% oxygen Induction Type: IV induction Ventilation: Mask ventilation without difficulty Laryngoscope Size: Mac and 4 Grade View: Grade I Tube type: Oral Number of attempts: 1 Airway Equipment and Method: Stylet and Oral airway Placement Confirmation: ETT inserted through vocal cords under direct vision,  positive ETCO2 and breath sounds checked- equal and bilateral Secured at: 24 cm Tube secured with: Tape Dental Injury: Teeth and Oropharynx as per pre-operative assessment

## 2017-08-06 NOTE — Progress Notes (Signed)
PROGRESS NOTE                                                                                                                                                                                                             Patient Demographics:    Bryce Mendoza, is a 57 y.o. male, DOB - Dec 02, 1960, ZOX:[id number]  Admit date - 08/04/2017   Admitting Physician Narda Bonds, MD  Outpatient Primary MD for the patient is Patient, No Pcp Per  LOS - 1   Chief Complaint  Patient presents with  . Chest Pain       Brief Narrative   57 year old male without significant past medical history, presents with dyspnea, work-up significant for left-sided pleural effusion, status post thoracentesis with 1.8 L drained, with residual loculated pleural effusion present, work-up significant for exudative pleural effusion.    Subjective:    Bryce Mendoza today has, No headache, No chest pain, No abdominal pain -reports dyspnea has improved, denies any fever or chills  Assessment  & Plan :    Active Problems:   Pleural effusion on left   Large left pleural effusion/empyema -Status post ultrasound-guided thoracentesis, 1.8 L drained, work-up significant for exudate, but cytology has been negative for malignancy, and so far Gram stain has been negative, with no growth, but it had significant white blood cell count of 26,000 . -CT surgery consult greatly appreciated, still showing persistent empyema, plan for VATS tomorrow . -Empirically on IV Rocephin and azithromycin, discussed with ID, recommendation to include anaerobic coverage for presumed aspiration, will change to Unasyn . -Leukocytosis has resolved .[name]  Hypokalemia -Repleted,   Polycythemia -Likely secondary polycythemia related to smoking, trending down with hydration      Code Status : Full code  Family Communication  : None at bedside  Disposition Plan  : Home when stable  Barriers For Discharge :     Consults  :  CT surgery  Procedures  : Ultrasound-guided thoracentesis subcu Lovenox  DVT Prophylaxis  :  Henning LOENOX, will hold this evening and resume tomorrow after VATS  Lab Results  Component Value Date   PLT 303 08/06/2017    Antibiotics  :    Anti-infectives (From admission, onward)   Start     Dose/Rate Route Frequency Ordered Stop   08/04/17 2000  cefTRIAXone (ROCEPHIN) 1  g in sodium chloride 0.9 % 100 mL IVPB  Status:  Discontinued     1 g 200 mL/hr over 30 Minutes Intravenous Every 24 hours 08/04/17 1819 08/04/17 1836   08/04/17 2000  azithromycin (ZITHROMAX) tablet 500 mg  Status:  Discontinued     500 mg Oral Daily 08/04/17 1819 08/06/17 1042   08/04/17 2000  cefTRIAXone (ROCEPHIN) 2 g in sodium chloride 0.9 % 100 mL IVPB  Status:  Discontinued     2 g 200 mL/hr over 30 Minutes Intravenous Every 24 hours 08/04/17 1836 08/06/17 1043        Objective:   Vitals:   08/05/17 0712 08/05/17 2356 08/05/17 2357 08/06/17 0728  BP: (!) 142/85 (!) 122/91  116/81  Pulse: 87 76 80 81  Resp: 20 20  18   Temp: 98.3 F (36.8 C) 97.6 F (36.4 C)  98.3 F (36.8 C)  TempSrc: Oral Oral  Oral  SpO2: 95% (!) 83% 98% 94%  Weight:      Height:        Wt Readings from Last 3 Encounters:  08/04/17 116.3 kg (256 lb 6.3 oz)     Intake/Output Summary (Last 24 hours) at 08/06/2017 1044 Last data filed at 08/06/2017 0957 Gross per 24 hour  Intake 360 ml  Output -  Net 360 ml     Physical Exam  Awake Alert, Oriented X 3, No new F.N deficits, Normal affect Symmetrical Chest wall movement, diminished air entry in the left lung, no use of accessory muscles  RRR,No Gallops,Rubs or new Murmurs, No Parasternal Heave +ve B.Sounds, Abd Soft, No tenderness,No rebound - guarding or rigidity. No Cyanosis, Clubbing or edema, No new Rash or bruise      Data Review:    CBC Recent Labs  Lab 08/04/17 1159 08/05/17 0741 08/06/17 0523  WBC 14.6* 10.4 9.0  HGB 18.8* 18.1* 17.1*   HCT 52.1* 51.5 49.4  PLT 225 279 303  MCV 98.5 99.2 101.4*  MCH 35.5* 34.9* 35.1*  MCHC 36.1* 35.1 34.6  RDW 14.2 13.4 13.7    Chemistries  Recent Labs  Lab 08/04/17 1159 08/05/17 0741 08/06/17 0523  NA 134* 136 138  K 2.8* 3.1* 3.9  CL 101 100* 104  CO2 15* 22 22  GLUCOSE 143* 119* 107*  BUN 24* 18 15  CREATININE 1.11 1.07 0.93  CALCIUM 8.9 8.8* 8.5*  MG 1.8  --   --   AST  --  26  --   ALT  --  18  --   ALKPHOS  --  93  --   BILITOT  --  1.4*  --    ------------------------------------------------------------------------------------------------------------------ No results for input(s): CHOL, HDL, LDLCALC, TRIG, CHOLHDL, LDLDIRECT in the last 72 hours.  No results found for: HGBA1C ------------------------------------------------------------------------------------------------------------------ No results for input(s): TSH, T4TOTAL, T3FREE, THYROIDAB in the last 72 hours.  Invalid input(s): FREET3 ------------------------------------------------------------------------------------------------------------------ No results for input(s): VITAMINB12, FOLATE, FERRITIN, TIBC, IRON, RETICCTPCT in the last 72 hours.  Coagulation profile Recent Labs  Lab 08/04/17 1159 08/05/17 0741  INR 1.19 1.23    No results for input(s): DDIMER in the last 72 hours.  Cardiac Enzymes No results for input(s): CKMB, TROPONINI, MYOGLOBIN in the last 168 hours.  Invalid input(s): CK ------------------------------------------------------------------------------------------------------------------ No results found for: BNP  Inpatient Medications  Scheduled Meds: . [START ON 08/07/2017] enoxaparin (LOVENOX) injection  40 mg Subcutaneous Q24H  . guaiFENesin  1,200 mg Oral BID  . ketoconazole   Topical BID  .  senna-docusate  2 tablet Oral BID  . sodium chloride flush  3 mL Intravenous Q12H   Continuous Infusions: . sodium chloride     PRN Meds:.sodium chloride,  HYDROcodone-acetaminophen, sodium chloride flush  Micro Results Recent Results (from the past 240 hour(s))  Body fluid culture     Status: None (Preliminary result)   Collection Time: 08/04/17  2:11 PM  Result Value Ref Range Status   Specimen Description   Final    PLEURAL LEFT Performed at [facility], [address] [place], [place] [address]    Special Requests   Final    NONE Performed at [facility], [address], [place], [place] [address]    Gram Stain   Final    ABUNDANT WBC PRESENT, PREDOMINANTLY PMN NO ORGANISMS SEEN    Culture   Final    NO GROWTH 2 DAYS Performed at [facility], [address] [place], [place] [address]    Report Status PENDING  Incomplete  Acid Fast Smear (AFB)     Status: None   Collection Time: 08/04/17  2:11 PM  Result Value Ref Range Status   AFB Specimen Processing Concentration  Final   Acid Fast Smear Negative  Final    Comment: (NOTE) Performed At: [facility] [address] [place], [place] [id number] Jolene Schimke MD EX:[id number]    Source (AFB) PLEURAL  Final    Comment: LEFT Performed at [facility], [address], [place], [place] [address]   Blood culture (routine x 2)     Status: None (Preliminary result)   Collection Time: 08/04/17  2:36 PM  Result Value Ref Range Status   Specimen Description   Final    BLOOD LEFT ANTECUBITAL Performed at [facility], [address], [place], [place] [address]    Special Requests   Final    BOTTLES DRAWN AEROBIC AND ANAEROBIC Blood Culture adequate volume Performed at [facility], [address], [place], [place] [address]    Culture   Final    NO GROWTH < 24 HOURS Performed at [facility], [address] [place], [place] [address]    Report Status PENDING  Incomplete  Blood culture (routine x 2)     Status: None (Preliminary result)   Collection Time:  08/04/17  3:05 PM  Result Value Ref Range Status   Specimen Description   Final    BLOOD RIGHT ANTECUBITAL Performed at [facility], [address], [place], [place] [address]    Special Requests   Final    BOTTLES DRAWN AEROBIC AND ANAEROBIC Blood Culture adequate volume Performed at [facility], [address], [place], [place] [address]    Culture   Final    NO GROWTH < 24 HOURS Performed at [facility], [address] [place], [place] [address]    Report Status PENDING  Incomplete    Radiology Reports Dg Chest 1 View  Result Date: 08/04/2017 CLINICAL DATA:  Status post LEFT thoracentesis. EXAM: CHEST  1 VIEW COMPARISON:  08/04/2017 radiographs FINDINGS: LEFT pleural effusion has decreased in size, now moderate to large. There is no evidence of pneumothorax. RIGHT lung is clear. The cardiomediastinal silhouette is unchanged. IMPRESSION: Decreased LEFT pleural effusion, now moderate to large. No pneumothorax. Electronically Signed   By: Harmon Pier M.D.   On: 08/04/2017 14:39   Dg Chest 2 View  Result Date:  08/06/2017 CLINICAL DATA:  Pleural effusion. EXAM: CHEST - 2 VIEW COMPARISON:  August 04, 2017 FINDINGS: The left-sided pleural effusion with underlying opacity is stable. No pneumothorax. The cardiomediastinal silhouette is unchanged. The right lung is clear. No other abnormalities. IMPRESSION: Stable left effusion. Electronically Signed   By: Gerome Samavid  Williams III M.D   On: 08/06/2017 09:37   Dg Chest 2 View  Result Date: 08/04/2017 CLINICAL DATA:  Chest pain, shortness of breath. EXAM: CHEST - 2 VIEW COMPARISON:  None. FINDINGS: The heart size and mediastinal contours are within normal limits. Right lung is clear. No pneumothorax is noted. Interval development of large left pleural effusion is noted with probable underlying atelectasis or infiltrate. The visualized skeletal structures are unremarkable. IMPRESSION: Interval development of  large left pleural effusion with probable underlying atelectasis or infiltrate. Electronically Signed   By: Lupita RaiderJames  Green Jr, M.D.   On: 08/04/2017 12:50   Ct Chest W Contrast  Result Date: 08/04/2017 CLINICAL DATA:  Dyspnea with left-sided chest pain on 11 days ago. Thoracentesis earlier today. EXAM: CT CHEST WITH CONTRAST TECHNIQUE: Multidetector CT imaging of the chest was performed during intravenous contrast administration. CONTRAST:  75mL ISOVUE-300 IOPAMIDOL (ISOVUE-300) INJECTION 61% COMPARISON:  Chest radiographs earlier today FINDINGS: Cardiovascular: Suboptimal contrast bolus. No acute vascular findings are seen. There is mild coronary artery atherosclerosis. The heart size is normal. There is no pericardial effusion. Mediastinum/Nodes: There are no enlarged mediastinal, hilar or axillary lymph nodes.There are scattered small mediastinal lymph nodes. There are also prominent left internal mammary lymph nodes, measuring to 10 mm short axis on image 55/2. The thyroid gland, trachea and esophagus demonstrate no significant findings. Lungs/Pleura: There is a moderate size residual left pleural effusion which is loculated laterally and with associated pleural thickening. No pleural based mass identified on noncontrast imaging. There is no pneumothorax or right-sided pleural effusion. There is compressive left lung atelectasis, especially in the lower lobe. The right lung is well aerated with patchy ground-glass opacities, especially in the upper lobe. No pulmonary nodule or endobronchial lesion seen. Upper abdomen: Borderline hepatic steatosis without apparent focal hepatic abnormality. The adrenal glands appear normal. Portacaval node measuring 17 mm short axis on image 165/2. Musculoskeletal/Chest wall: There is no chest wall mass or suspicious osseous finding. IMPRESSION: 1. Moderate size residual loculated left pleural effusion is suboptimally characterized without contrast, although is associated with  some pleural thickening, suggesting a possible exudate. Correlate with thoracentesis results. 2. Resulting compressive left lung atelectasis, especially in the lower lobe. No endobronchial lesion or suspicious nodule within the aerated lungs. 3. Patchy right lung ground-glass opacities (mosaic attenuation), likely related to small airways disease or edema. No evidence of pulmonary embolism on nondedicated imaging. 4. Enlarged left internal mammary and portacaval lymph nodes are nonspecific and possibly reactive. No mediastinal adenopathy. Electronically Signed   By: Carey BullocksWilliam  Veazey M.D.   On: 08/04/2017 16:35   [place] Thoracentesis Asp Pleural Space W/img Guide  Result Date: 08/04/2017 INDICATION: Patient with history of worsening left-sided chest discomfort, exertional dyspnea, and left-sided pleural effusion. Request is made for diagnostic and therapeutic thoracentesis. EXAM: ULTRASOUND GUIDED DIAGNOSTIC AND THERAPEUTIC LEFT THORACENTESIS MEDICATIONS: 10 milliliters of 1% lidocaine COMPLICATIONS: None immediate. PROCEDURE: An ultrasound guided thoracentesis was thoroughly discussed with the patient and questions answered. The benefits, risks, alternatives and complications were also discussed. The patient understands and wishes to proceed with the procedure. Written consent was obtained. Ultrasound was performed to localize and mark an adequate pocket of fluid in the  left chest. The area was then prepped and draped in the normal sterile fashion. 1% Lidocaine was used for local anesthesia. Under ultrasound guidance a 6 Fr Safe-T-Centesis catheter was introduced. Thoracentesis was performed. The catheter was removed and a dressing applied. FINDINGS: A total of approximately 1.8 liters of hazy amber fluid was removed. Samples were sent to the laboratory as requested by the clinical team. IMPRESSION: Successful ultrasound guided left thoracentesis yielding 1.8 liters of pleural fluid. Read by: Elwin Mocha, PA-C  Electronically Signed   By: Jolaine Click M.D.   On: 08/04/2017 14:42    Huey Bienenstock M.D on 08/06/2017 at 10:44 AM  Between 7am to 7pm - Pager - [phone]  After 7pm go to [email or web address] - password [facility]  [facility] -  Office  [phone]

## 2017-08-06 NOTE — Anesthesia Postprocedure Evaluation (Signed)
Anesthesia Post Note  Patient: Bryce Mendoza  Procedure(s) Performed: VIDEO BRONCHOSCOPY (N/A ) VIDEO ASSISTED THORACOSCOPY (VATS)/EMPYEMA, MINI THORACOTOMY (Left Chest) EMPYEMA DRAINAGE DECORTICATION     Patient location during evaluation: PACU Anesthesia Type: General Level of consciousness: awake and alert Pain management: pain level controlled Vital Signs Assessment: post-procedure vital signs reviewed and stable Respiratory status: spontaneous breathing, nonlabored ventilation, respiratory function stable and patient connected to nasal cannula oxygen Cardiovascular status: blood pressure returned to baseline and stable Postop Assessment: no apparent nausea or vomiting Anesthetic complications: no    Last Vitals:  Vitals:   08/06/17 2052 08/06/17 2139  BP: (!) 118/99   Pulse: 83   Resp: (!) 21 19  Temp:    SpO2: 95% 97%    Last Pain:  Vitals:   08/06/17 2139  TempSrc:   PainSc: 5                  Lakoda Raske,E. Rondell Pardon

## 2017-08-07 ENCOUNTER — Encounter (HOSPITAL_COMMUNITY): Payer: Self-pay | Admitting: Cardiothoracic Surgery

## 2017-08-07 ENCOUNTER — Inpatient Hospital Stay (HOSPITAL_COMMUNITY): Payer: Self-pay

## 2017-08-07 DIAGNOSIS — J869 Pyothorax without fistula: Principal | ICD-10-CM

## 2017-08-07 LAB — BLOOD GAS, ARTERIAL
Acid-base deficit: 7.9 mmol/L — ABNORMAL HIGH (ref 0.0–2.0)
Bicarbonate: 16.2 mmol/L — ABNORMAL LOW (ref 20.0–28.0)
O2 Content: 2 L/min
O2 Saturation: 97.7 %
PH ART: 7.383 (ref 7.350–7.450)
Patient temperature: 98.6
pCO2 arterial: 27.8 mmHg — ABNORMAL LOW (ref 32.0–48.0)
pO2, Arterial: 111 mmHg — ABNORMAL HIGH (ref 83.0–108.0)

## 2017-08-07 LAB — CBC
HEMATOCRIT: 49.2 % (ref 39.0–52.0)
Hemoglobin: 17.3 g/dL — ABNORMAL HIGH (ref 13.0–17.0)
MCH: 35.1 pg — ABNORMAL HIGH (ref 26.0–34.0)
MCHC: 35.2 g/dL (ref 30.0–36.0)
MCV: 99.8 fL (ref 78.0–100.0)
PLATELETS: 344 10*3/uL (ref 150–400)
RBC: 4.93 MIL/uL (ref 4.22–5.81)
RDW: 13.7 % (ref 11.5–15.5)
WBC: 13.5 10*3/uL — AB (ref 4.0–10.5)

## 2017-08-07 LAB — BASIC METABOLIC PANEL
ANION GAP: 10 (ref 5–15)
BUN: 11 mg/dL (ref 6–20)
CO2: 16 mmol/L — AB (ref 22–32)
Calcium: 8.1 mg/dL — ABNORMAL LOW (ref 8.9–10.3)
Chloride: 108 mmol/L (ref 101–111)
Creatinine, Ser: 0.79 mg/dL (ref 0.61–1.24)
GFR calc Af Amer: >60 mL/min (ref >60)
GLUCOSE: 161 mg/dL — AB (ref 65–99)
POTASSIUM: 4.3 mmol/L (ref 3.5–5.1)
Sodium: 134 mmol/L — ABNORMAL LOW (ref 135–145)

## 2017-08-07 MED ORDER — HYDROCODONE-ACETAMINOPHEN 5-325 MG PO TABS
1.0000 | ORAL_TABLET | Freq: Four times a day (QID) | ORAL | Status: DC | PRN
  Administered 2017-08-07 – 2017-08-14 (×11): 2 via ORAL
  Filled 2017-08-07 (×15): qty 2

## 2017-08-07 NOTE — Progress Notes (Signed)
PROGRESS NOTE                                                                                                                                                                                                             Patient Demographics:    Bryce Mendoza, is a 57 y.o. male, DOB - 1960-04-15, WJX:[id number]  Admit date - 08/04/2017   Admitting Physician Narda Bonds, MD  Outpatient Primary MD for the patient is Patient, No Pcp Per  LOS - 2   Chief Complaint  Patient presents with  . Chest Pain       Brief Narrative   57 year old male without significant past medical history, presents with dyspnea, work-up significant for left-sided pleural effusion, status post thoracentesis with 1.8 L drained, with residual loculated pleural effusion present, work-up significant for exudative pleural effusion/empyema, patient was seen by CT surgery, went for VATS/decortication procedure 08/06/2017.    Subjective:    Bryce Mendoza today has, No headache, No chest pain, No abdominal pain -denies any fever or chills  Assessment  & Plan :    Active Problems:   Pleural effusion on left   Large left pleural effusion/empyema -Status post ultrasound-guided thoracentesis, 1.8 L drained, work-up significant for exudate, but cytology has been negative for malignancy, and so far Gram stain has been negative, with no growth, but it had significant white blood cell count of 26,000 . -CT surgery consult greatly appreciated, he went for video bronchoscopy/VATS and thoracotomy with decortication and empyema drainage by CT surgery on 08/06/2017, so far intraoperative cultures Gram stain are negative, with no growth. -Empirically on IV Rocephin and azithromycin, discussed with ID, recommendation to include anaerobic coverage for presumed aspiration, was changed to Unasyn -Follow on intraoperative cultures. -Chest tube management per CT surgery.  Hypokalemia -Repleted,    Polycythemia -Likely secondary polycythemia related to smoking, trending down with hydration  Hyponatremia -Mild, DC 1/2 NS.      Code Status : Full code  Family Communication  : None at bedside  Disposition Plan  : Home when stable  Barriers For Discharge :   Consults  :  CT surgery  Procedures  : Ultrasound-guided thoracentesis,  - VIDEO BRONCHOSCOPY (N/A), VIDEO ASSISTED THORACOSCOPY (VATS)/EMPYEMA, MINI THORACOTOMY (Left), EMPYEMA DRAINAGE, DECORTICATION    DVT Prophylaxis  :  [place] LOENOX,   Lab  Results  Component Value Date   PLT 344 08/07/2017    Antibiotics  :    Anti-infectives (From admission, onward)   Start     Dose/Rate Route Frequency Ordered Stop   08/06/17 1300  ceFAZolin (ANCEF) IVPB 2g/100 mL premix     2 g 200 mL/hr over 30 Minutes Intravenous To Short Stay 08/06/17 1256 08/06/17 1716   08/06/17 1200  Ampicillin-Sulbactam (UNASYN) 3 g in sodium chloride 0.9 % 100 mL IVPB     3 g 200 mL/hr over 30 Minutes Intravenous Every 6 hours 08/06/17 1052     08/04/17 2000  cefTRIAXone (ROCEPHIN) 1 g in sodium chloride 0.9 % 100 mL IVPB  Status:  Discontinued     1 g 200 mL/hr over 30 Minutes Intravenous Every 24 hours 08/04/17 1819 08/04/17 1836   08/04/17 2000  azithromycin (ZITHROMAX) tablet 500 mg  Status:  Discontinued     500 mg Oral Daily 08/04/17 1819 08/06/17 1042   08/04/17 2000  cefTRIAXone (ROCEPHIN) 2 g in sodium chloride 0.9 % 100 mL IVPB  Status:  Discontinued     2 g 200 mL/hr over 30 Minutes Intravenous Every 24 hours 08/04/17 1836 08/06/17 1043        Objective:   Vitals:   08/07/17 0325 08/07/17 0339 08/07/17 0800 08/07/17 0801  BP: (!) 135/93   119/86  Pulse: 73   66  Resp: 19 20 (!) 21 (!) 21  Temp: 97.7 F (36.5 C)   97.7 F (36.5 C)  TempSrc: Oral   Oral  SpO2: 96% 97% 97% 99%  Weight:      Height:        Wt Readings from Last 3 Encounters:  08/06/17 117 kg (257 lb 15 oz)     Intake/Output Summary (Last 24  hours) at 08/07/2017 1018 Last data filed at 08/07/2017 0927 Gross per 24 hour  Intake 3509.67 ml  Output 1315 ml  Net 2194.67 ml     Physical Exam  Awake Alert, Oriented X 3, No new F.N deficits, Normal affect Symmetrical Chest wall movement, diminished air entry in left lower lung, chest tube in left lower lung. RRR,No Gallops,Rubs or new Murmurs, No Parasternal Heave +ve B.Sounds, Abd Soft, No tenderness, No organomegaly appriciated, No rebound - guarding or rigidity. No Cyanosis, Clubbing or edema, No new Rash or bruise      Data Review:    CBC Recent Labs  Lab 08/04/17 1159 08/05/17 0741 08/06/17 0523 08/06/17 1312 08/07/17 0330  WBC 14.6* 10.4 9.0 9.5 13.5*  HGB 18.8* 18.1* 17.1* 18.1* 17.3*  HCT 52.1* 51.5 49.4 51.9 49.2  PLT 225 279 303 308 344  MCV 98.5 99.2 101.4* 101.0* 99.8  MCH 35.5* 34.9* 35.1* 35.2* 35.1*  MCHC 36.1* 35.1 34.6 34.9 35.2  RDW 14.2 13.4 13.7 13.9 13.7    Chemistries  Recent Labs  Lab 08/04/17 1159 08/05/17 0741 08/06/17 0523 08/06/17 1312 08/07/17 0330  NA 134* 136 138 135 134*  K 2.8* 3.1* 3.9 4.0 4.3  CL 101 100* 104 108 108  CO2 15* 22 22 14* 16*  GLUCOSE 143* 119* 107* 115* 161*  BUN 24* 18 15 15 11   CREATININE 1.11 1.07 0.93 0.79 0.79  CALCIUM 8.9 8.8* 8.5* 8.4* 8.1*  MG 1.8  --   --   --   --   AST  --  26  --  35  --   ALT  --  18  --  24  --  ALKPHOS  --  93  --  89  --   BILITOT  --  1.4*  --  0.9  --    ------------------------------------------------------------------------------------------------------------------ No results for input(s): CHOL, HDL, LDLCALC, TRIG, CHOLHDL, LDLDIRECT in the last 72 hours.  No results found for: HGBA1C ------------------------------------------------------------------------------------------------------------------ No results for input(s): TSH, T4TOTAL, T3FREE, THYROIDAB in the last 72 hours.  Invalid input(s):  FREET3 ------------------------------------------------------------------------------------------------------------------ No results for input(s): VITAMINB12, FOLATE, FERRITIN, TIBC, IRON, RETICCTPCT in the last 72 hours.  Coagulation profile Recent Labs  Lab 08/04/17 1159 08/05/17 0741 08/06/17 1312  INR 1.19 1.23 1.14    No results for input(s): DDIMER in the last 72 hours.  Cardiac Enzymes No results for input(s): CKMB, TROPONINI, MYOGLOBIN in the last 168 hours.  Invalid input(s): CK ------------------------------------------------------------------------------------------------------------------ No results found for: BNP  Inpatient Medications  Scheduled Meds: . acetaminophen  1,000 mg Oral Q6H   Or  . acetaminophen (TYLENOL) oral liquid 160 mg/5 mL  1,000 mg Oral Q6H  . bisacodyl  10 mg Oral Daily  . enoxaparin (LOVENOX) injection  40 mg Subcutaneous Q24H  . fentaNYL   Intravenous Q4H  . guaiFENesin  1,200 mg Oral BID  . ketoconazole   Topical BID  . levalbuterol  0.63 mg Nebulization TID  . metoCLOPramide (REGLAN) injection  10 mg Intravenous Q6H  . senna-docusate  2 tablet Oral BID  . sodium chloride flush  3 mL Intravenous Q12H   Continuous Infusions: . sodium chloride 10 mL/hr at 08/07/17 0840  . ampicillin-sulbactam (UNASYN) IV Stopped (08/07/17 0981)  . potassium chloride     PRN Meds:.sodium chloride, diphenhydrAMINE **OR** diphenhydrAMINE, HYDROcodone-acetaminophen, levalbuterol, naloxone **AND** sodium chloride flush, ondansetron (ZOFRAN) IV, oxyCODONE, potassium chloride, sodium chloride flush, traMADol  Micro Results Recent Results (from the past 240 hour(s))  Body fluid culture     Status: None (Preliminary result)   Collection Time: 08/04/17  2:11 PM  Result Value Ref Range Status   Specimen Description   Final    PLEURAL LEFT Performed at [facility], [address], [place], [place] [address]    Special Requests   Final     NONE Performed at [facility], [address] [place], [place] [address]    Gram Stain   Final    ABUNDANT WBC PRESENT, PREDOMINANTLY PMN NO ORGANISMS SEEN    Culture   Final    NO GROWTH 3 DAYS Performed at [facility], [address] [place], [place] [address]    Report Status PENDING  Incomplete  Acid Fast Smear (AFB)     Status: None   Collection Time: 08/04/17  2:11 PM  Result Value Ref Range Status   AFB Specimen Processing Concentration  Final   Acid Fast Smear Negative  Final    Comment: (NOTE) Performed At: [facility] [address] [place], [place] [id number] Jolene Schimke MD XB:[id number]    Source (AFB) PLEURAL  Final    Comment: LEFT Performed at [facility], [address] [place], [place] [address]   Blood culture (routine x 2)     Status: None (Preliminary result)   Collection Time: 08/04/17  2:36 PM  Result Value Ref Range Status   Specimen Description   Final    BLOOD LEFT ANTECUBITAL Performed at [facility], [address], [place], [place] [address]    Special Requests   Final    BOTTLES DRAWN AEROBIC AND ANAEROBIC Blood Culture adequate volume  Performed at [facility], [address] [place], [place] [address]    Culture   Final    NO GROWTH 2 DAYS Performed at [facility], [address] [place], [place] [address]    Report Status PENDING  Incomplete  Blood culture (routine x 2)     Status: None (Preliminary result)   Collection Time: 08/04/17  3:05 PM  Result Value Ref Range Status   Specimen Description   Final    BLOOD RIGHT ANTECUBITAL Performed at [facility], [address] [place], [place] [address]    Special Requests   Final    BOTTLES DRAWN AEROBIC AND ANAEROBIC Blood Culture adequate volume Performed at [facility], [address] [place], [place] [address]    Culture   Final     NO GROWTH 2 DAYS Performed at [facility], [address] [place], [place] [address]    Report Status PENDING  Incomplete  MRSA PCR Screening     Status: None   Collection Time: 08/06/17  3:58 PM  Result Value Ref Range Status   MRSA by PCR NEGATIVE NEGATIVE Final    Comment:        The GeneXpert MRSA Assay (FDA approved for NASAL specimens only), is one component of a comprehensive MRSA colonization surveillance program. It is not intended to diagnose MRSA infection nor to guide or monitor treatment for MRSA infections. Performed at [facility], [address] [place], [place] [address]   Culture, respiratory (NON-Expectorated)     Status: None (Preliminary result)   Collection Time: 08/06/17  5:29 PM  Result Value Ref Range Status   Specimen Description BRONCHIAL WASHINGS LEFT  Final   Special Requests NONE  Final   Gram Stain   Final    RARE WBC PRESENT, PREDOMINANTLY PMN NO ORGANISMS SEEN Performed at [facility], [address] [place], [place] [address]    Culture PENDING  Incomplete   Report Status PENDING  Incomplete  Aerobic/Anaerobic Culture (surgical/deep wound)     Status: None (Preliminary result)   Collection Time: 08/06/17  6:20 PM  Result Value Ref Range Status   Specimen Description FLUID PLEURAL LEFT  Final   Special Requests NONE  Final   Gram Stain   Final    ABUNDANT WBC PRESENT, PREDOMINANTLY PMN NO ORGANISMS SEEN Performed at [facility], [address] [place], [place] [address]    Culture PENDING  Incomplete   Report Status PENDING  Incomplete  Aerobic/Anaerobic Culture (surgical/deep wound)     Status: None (Preliminary result)   Collection Time: 08/06/17  6:29 PM  Result Value Ref Range Status   Specimen Description TISSUE LEFT PLEURAL  Final   Special Requests PLEURAL PEEL  Final   Gram Stain   Final    ABUNDANT WBC PRESENT, PREDOMINANTLY PMN NO ORGANISMS SEEN Performed at [facility], [address] [place], [place] [address]    Culture PENDING  Incomplete   Report Status PENDING  Incomplete    Radiology Reports Dg Chest 1 View  Result Date: 08/04/2017 CLINICAL DATA:  Status post LEFT thoracentesis. EXAM: CHEST  1 VIEW COMPARISON:  08/04/2017 radiographs FINDINGS: LEFT pleural effusion has decreased in size, now moderate to large. There is no evidence of pneumothorax. RIGHT lung is clear. The cardiomediastinal silhouette is unchanged. IMPRESSION: Decreased LEFT pleural effusion, now moderate to large. No pneumothorax. Electronically Signed  By: Harmon Pier M.D.   On: 08/04/2017 14:39   Dg Chest 2 View  Result Date: 08/06/2017 CLINICAL DATA:  Pleural effusion. EXAM: CHEST - 2 VIEW COMPARISON:  August 04, 2017 FINDINGS: The left-sided pleural effusion with underlying opacity is stable. No pneumothorax. The cardiomediastinal silhouette is unchanged. The right lung is clear. No other abnormalities. IMPRESSION: Stable left effusion. Electronically Signed   By: Gerome Sam III M.D   On: 08/06/2017 09:37   Dg Chest 2 View  Result Date: 08/04/2017 CLINICAL DATA:  Chest pain, shortness of breath. EXAM: CHEST - 2 VIEW COMPARISON:  None. FINDINGS: The heart size and mediastinal contours are within normal limits. Right lung is clear. No pneumothorax is noted. Interval development of large left pleural effusion is noted with probable underlying atelectasis or infiltrate. The visualized skeletal structures are unremarkable. IMPRESSION: Interval development of large left pleural effusion with probable underlying atelectasis or infiltrate. Electronically Signed   By: Lupita Raider, M.D.   On: 08/04/2017 12:50   Ct Chest W Contrast  Result Date: 08/04/2017 CLINICAL DATA:  Dyspnea with left-sided chest pain on 11 days ago. Thoracentesis earlier today. EXAM: CT CHEST WITH CONTRAST TECHNIQUE: Multidetector CT imaging of the chest was performed during intravenous contrast administration. CONTRAST:  75mL  ISOVUE-300 IOPAMIDOL (ISOVUE-300) INJECTION 61% COMPARISON:  Chest radiographs earlier today FINDINGS: Cardiovascular: Suboptimal contrast bolus. No acute vascular findings are seen. There is mild coronary artery atherosclerosis. The heart size is normal. There is no pericardial effusion. Mediastinum/Nodes: There are no enlarged mediastinal, hilar or axillary lymph nodes.There are scattered small mediastinal lymph nodes. There are also prominent left internal mammary lymph nodes, measuring to 10 mm short axis on image 55/2. The thyroid gland, trachea and esophagus demonstrate no significant findings. Lungs/Pleura: There is a moderate size residual left pleural effusion which is loculated laterally and with associated pleural thickening. No pleural based mass identified on noncontrast imaging. There is no pneumothorax or right-sided pleural effusion. There is compressive left lung atelectasis, especially in the lower lobe. The right lung is well aerated with patchy ground-glass opacities, especially in the upper lobe. No pulmonary nodule or endobronchial lesion seen. Upper abdomen: Borderline hepatic steatosis without apparent focal hepatic abnormality. The adrenal glands appear normal. Portacaval node measuring 17 mm short axis on image 165/2. Musculoskeletal/Chest wall: There is no chest wall mass or suspicious osseous finding. IMPRESSION: 1. Moderate size residual loculated left pleural effusion is suboptimally characterized without contrast, although is associated with some pleural thickening, suggesting a possible exudate. Correlate with thoracentesis results. 2. Resulting compressive left lung atelectasis, especially in the lower lobe. No endobronchial lesion or suspicious nodule within the aerated lungs. 3. Patchy right lung ground-glass opacities (mosaic attenuation), likely related to small airways disease or edema. No evidence of pulmonary embolism on nondedicated imaging. 4. Enlarged left internal mammary  and portacaval lymph nodes are nonspecific and possibly reactive. No mediastinal adenopathy. Electronically Signed   By: Carey Bullocks M.D.   On: 08/04/2017 16:35   Dg Chest Port 1 View  Result Date: 08/07/2017 CLINICAL DATA:  Shortness of breath today.  Chest tube present. EXAM: PORTABLE CHEST 1 VIEW COMPARISON:  Chest x-rays dated 08/06/2017 in 08/04/2017. Chest CT dated 08/04/2017. FINDINGS: Two LEFT-sided chest tubes are stable in position. The small loculated LEFT pleural effusion and/or pleural thickening is stable compared to yesterday's most recent chest x-ray, improved compared to earlier exams. Probable associated atelectasis at the LEFT lung base. RIGHT lung is clear. Stable cardiomegaly.  IMPRESSION: No change compared to yesterday's most recent chest x-ray. The small loculated LEFT pleural effusion and/or pleural thickening is unchanged. LEFT-sided chest tubes are stable in position. Electronically Signed   By: Bary Richard M.D.   On: 08/07/2017 09:09   Dg Chest Port 1 View  Result Date: 08/06/2017 CLINICAL DATA:  Status post VATS with empyema drainage/decortication EXAM: PORTABLE CHEST 1 VIEW COMPARISON:  08/06/2017 at 0701 hours FINDINGS: Postsurgical changes in the left hemithorax with small loculated left pleural effusion/pleural thickening, significantly improved. Two indwelling left chest tubes. No pneumothorax. Right lung is essentially clear. The heart is normal in size. IMPRESSION: Postsurgical changes in the left hemithorax with small loculated left pleural effusion/pleural thickening, significantly improved. Two indwelling left chest tube.  No pneumothorax. Electronically Signed   By: Charline Bills M.D.   On: 08/06/2017 20:39   US Thoracentesis Asp Pleural Space W/img Guide  Result Date: 08/04/2017 INDICATION: Patient with history of worsening left-sided chest discomfort, exertional dyspnea, and left-sided pleural effusion. Request is made for diagnostic and therapeutic  thoracentesis. EXAM: ULTRASOUND GUIDED DIAGNOSTIC AND THERAPEUTIC LEFT THORACENTESIS MEDICATIONS: 10 milliliters of 1% lidocaine COMPLICATIONS: None immediate. PROCEDURE: An ultrasound guided thoracentesis was thoroughly discussed with the patient and questions answered. The benefits, risks, alternatives and complications were also discussed. The patient understands and wishes to proceed with the procedure. Written consent was obtained. Ultrasound was performed to localize and mark an adequate pocket of fluid in the left chest. The area was then prepped and draped in the normal sterile fashion. 1% Lidocaine was used for local anesthesia. Under ultrasound guidance a 6 Fr Safe-T-Centesis catheter was introduced. Thoracentesis was performed. The catheter was removed and a dressing applied. FINDINGS: A total of approximately 1.8 liters of hazy amber fluid was removed. Samples were sent to the laboratory as requested by the clinical team. IMPRESSION: Successful ultrasound guided left thoracentesis yielding 1.8 liters of pleural fluid. Read by: Elwin Mocha, PA-C Electronically Signed   By: Jolaine Click M.D.   On: 08/04/2017 14:42    Huey Bienenstock M.D on 08/07/2017 at 10:17 AM  Between 7am to 7pm - Pager - [phone]  After 7pm go to [email or web address] - password [facility]  [facility] -  Office  [phone]

## 2017-08-07 NOTE — Progress Notes (Signed)
Patient ID: Bryce Mendoza, male   DOB: January 18, 1961, 57 y.o.   MRN: [id number] TCTS DAILY ICU PROGRESS NOTE                   [address]            [name] [address]          [phone]   1 Day Post-Op Procedure(s) (LRB): VIDEO BRONCHOSCOPY (N/A) VIDEO ASSISTED THORACOSCOPY (VATS)/EMPYEMA, MINI THORACOTOMY (Left) EMPYEMA DRAINAGE DECORTICATION  Total Length of Stay:  LOS: 2 days   Subjective: Patient feels better than yesterday, respiratory status is stable  Objective: Vital signs in last 24 hours: Temp:  [97.7 F (36.5 C)-98.1 F (36.7 C)] 97.7 F (36.5 C) (06/07 0325) Pulse Rate:  [73-83] 73 (06/07 0325) Cardiac Rhythm: Normal sinus rhythm (06/07 0518) Resp:  [18-34] 20 (06/07 0339) BP: (111-135)/(62-99) 135/93 (06/07 0325) SpO2:  [94 %-98 %] 97 % (06/07 0339) Arterial Line BP: (115-147)/(70-90) 115/73 (06/06 2052) Weight:  [257 lb 15 oz (117 kg)] 257 lb 15 oz (117 kg) (06/06 2110)  Filed Weights   08/04/17 1151 08/04/17 2056 08/06/17 2110  Weight: 254 lb (115.2 kg) 256 lb 6.3 oz (116.3 kg) 257 lb 15 oz (117 kg)    Weight change:    Hemodynamic parameters for last 24 hours:    Intake/Output from previous day: 06/06 0701 - 06/07 0700 In: 3386.7 [P.O.:170; I.V.:2796.7; IV Piggyback:300] Out: 1115 [Urine:615; Blood:50; Chest Tube:450]  Intake/Output this shift: No intake/output data recorded.  Current Meds: Scheduled Meds: . acetaminophen  1,000 mg Oral Q6H   Or  . acetaminophen (TYLENOL) oral liquid 160 mg/5 mL  1,000 mg Oral Q6H  . bisacodyl  10 mg Oral Daily  . enoxaparin (LOVENOX) injection  40 mg Subcutaneous Q24H  . fentaNYL   Intravenous Q4H  . guaiFENesin  1,200 mg Oral BID  . HYDROmorphone      . ketoconazole   Topical BID  . levalbuterol  0.63 mg Nebulization TID  . metoCLOPramide (REGLAN) injection  10 mg Intravenous Q6H  . oxyCODONE      . senna-docusate  2 tablet Oral BID  . sodium chloride flush  3 mL Intravenous Q12H    Continuous Infusions: . sodium chloride    . ampicillin-sulbactam (UNASYN) IV Stopped (08/07/17 2130)  . potassium chloride     PRN Meds:.sodium chloride, diphenhydrAMINE **OR** diphenhydrAMINE, HYDROcodone-acetaminophen, levalbuterol, naloxone **AND** sodium chloride flush, ondansetron (ZOFRAN) IV, oxyCODONE, potassium chloride, sodium chloride flush, traMADol  General appearance: alert, cooperative and no distress Neurologic: intact Heart: regular rate and rhythm, S1, S2 normal, no murmur, click, rub or gallop Lungs: diminished breath sounds LLL Abdomen: soft, non-tender; bowel sounds normal; no masses,  no organomegaly Extremities: extremities normal, atraumatic, no cyanosis or edema and Homans sign is negative, no sign of DVT Wound: Drainage has slowed from chest tubes  Lab Results: CBC: Recent Labs    08/06/17 1312 08/07/17 0330  WBC 9.5 13.5*  HGB 18.1* 17.3*  HCT 51.9 49.2  PLT 308 344   BMET:  Recent Labs    08/06/17 1312 08/07/17 0330  NA 135 134*  K 4.0 4.3  CL 108 108  CO2 14* 16*  GLUCOSE 115* 161*  BUN 15 11  CREATININE 0.79 0.79  CALCIUM 8.4* 8.1*    CMET: Lab Results  Component Value Date   WBC 13.5 (H) 08/07/2017   HGB 17.3 (H) 08/07/2017   HCT 49.2 08/07/2017   PLT 344 08/07/2017  GLUCOSE 161 (H) 08/07/2017   ALT 24 08/06/2017   AST 35 08/06/2017   NA 134 (L) 08/07/2017   K 4.3 08/07/2017   CL 108 08/07/2017   CREATININE 0.79 08/07/2017   BUN 11 08/07/2017   CO2 16 (L) 08/07/2017   INR 1.14 08/06/2017      PT/INR:  Recent Labs    08/06/17 1312  LABPROT 14.5  INR 1.14   Radiology: Dg Chest Port 1 View  Result Date: 08/06/2017 CLINICAL DATA:  Status post VATS with empyema drainage/decortication EXAM: PORTABLE CHEST 1 VIEW COMPARISON:  08/06/2017 at 0701 hours FINDINGS: Postsurgical changes in the left hemithorax with small loculated left pleural effusion/pleural thickening, significantly improved. Two indwelling left chest  tubes. No pneumothorax. Right lung is essentially clear. The heart is normal in size. IMPRESSION: Postsurgical changes in the left hemithorax with small loculated left pleural effusion/pleural thickening, significantly improved. Two indwelling left chest tube.  No pneumothorax. Electronically Signed   By: Charline BillsSriyesh  Krishnan M.D.   On: 08/06/2017 20:39     Assessment/Plan: S/P Procedure(s) (LRB): VIDEO BRONCHOSCOPY (N/A) VIDEO ASSISTED THORACOSCOPY (VATS)/EMPYEMA, MINI THORACOTOMY (Left) EMPYEMA DRAINAGE DECORTICATION Mobilize Diabetes control Continue ABX therapy due to preop empyema DC Foley and arterial line this a.m. Leave chest tubes Cultures pending to adjust antibiotics as appropriate  Delight Ovensdward B Glenisha Gundry 08/07/2017 7:51 AM

## 2017-08-07 NOTE — Discharge Instructions (Signed)
Thoracoscopy, Care After °Refer to this sheet in the next few weeks. These instructions provide you with information about caring for yourself after your procedure. Your health care provider may also give you more specific instructions. Your treatment has been planned according to current medical practices, but problems sometimes occur. Call your health care provider if you have any problems or questions after your procedure. °What can I expect after the procedure? °After your procedure, it is common to feel sore for up to two weeks. °Follow these instructions at home: °· There are many different ways to close and cover an incision, including stitches (sutures), skin glue, and adhesive strips. Follow your health care provider's instructions about: °? Incision care. °? Bandage (dressing) changes and removal. °? Incision closure removal. °· Check your incision area every day for signs of infection. Watch for: °? Redness, swelling, or pain. °? Fluid, blood, or pus. °· Take medicines only as directed by your health care provider. °· Try to cough often. Coughing helps to protect against lung infection (pneumonia). It may hurt to cough. If this happens, hold a pillow against your chest when you cough. °· Take deep breaths. This also helps to protect against pneumonia. °· If you were given an incentive spirometer, use it as directed by your health care provider. °· Do not take baths, swim, or use a hot tub until your health care provider approves. You may take showers. °· Avoid lifting until your health care provider approves. °· Avoid driving until your health care provider approves. °· Do not travel by airplane after the chest tube is removed until your health care provider approves. °Contact a health care provider if: °· You have a fever. °· Pain medicines do not ease your pain. °· You have redness, swelling, or increasing pain in your incision area. °· You develop a cough that does not go away, or you are coughing up  mucus that is yellow or green. °Get help right away if: °· You have fluid, blood, or pus coming from your incision. °· There is a bad smell coming from your incision or dressing. °· You develop a rash. °· You have difficulty breathing. °· You cough up blood. °· You develop light-headedness or you feel faint. °· You develop chest pain. °· Your heartbeat feels irregular or very fast. °This information is not intended to replace advice given to you by your health care provider. Make sure you discuss any questions you have with your health care provider. °Document Released: 09/06/2004 Document Revised: 10/21/2015 Document Reviewed: 11/02/2013 °Elsevier Interactive Patient Education © 2018 [employer] ° °

## 2017-08-08 ENCOUNTER — Inpatient Hospital Stay (HOSPITAL_COMMUNITY): Payer: Self-pay

## 2017-08-08 LAB — COMPREHENSIVE METABOLIC PANEL
ALT: 16 U/L — ABNORMAL LOW (ref 17–63)
ANION GAP: 9 (ref 5–15)
AST: 18 U/L (ref 15–41)
Albumin: 1.9 g/dL — ABNORMAL LOW (ref 3.5–5.0)
Alkaline Phosphatase: 73 U/L (ref 38–126)
BILIRUBIN TOTAL: 0.6 mg/dL (ref 0.3–1.2)
BUN: 11 mg/dL (ref 6–20)
CO2: 19 mmol/L — ABNORMAL LOW (ref 22–32)
Calcium: 8 mg/dL — ABNORMAL LOW (ref 8.9–10.3)
Chloride: 107 mmol/L (ref 101–111)
Creatinine, Ser: 0.8 mg/dL (ref 0.61–1.24)
Glucose, Bld: 107 mg/dL — ABNORMAL HIGH (ref 65–99)
POTASSIUM: 3.7 mmol/L (ref 3.5–5.1)
Sodium: 135 mmol/L (ref 135–145)
TOTAL PROTEIN: 5.6 g/dL — AB (ref 6.5–8.1)

## 2017-08-08 LAB — ACID FAST SMEAR (AFB, MYCOBACTERIA)

## 2017-08-08 LAB — BODY FLUID CULTURE: Culture: NO GROWTH

## 2017-08-08 LAB — CBC
HEMATOCRIT: 48.1 % (ref 39.0–52.0)
Hemoglobin: 16.3 g/dL (ref 13.0–17.0)
MCH: 35 pg — ABNORMAL HIGH (ref 26.0–34.0)
MCHC: 33.9 g/dL (ref 30.0–36.0)
MCV: 103.2 fL — AB (ref 78.0–100.0)
Platelets: 347 10*3/uL (ref 150–400)
RBC: 4.66 MIL/uL (ref 4.22–5.81)
RDW: 13.8 % (ref 11.5–15.5)
WBC: 10.9 10*3/uL — ABNORMAL HIGH (ref 4.0–10.5)

## 2017-08-08 LAB — ACID FAST SMEAR (AFB): ACID FAST SMEAR - AFSCU2: NEGATIVE

## 2017-08-08 MED ORDER — POLYETHYLENE GLYCOL 3350 17 G PO PACK
17.0000 g | PACK | Freq: Two times a day (BID) | ORAL | Status: AC
  Administered 2017-08-08: 17 g via ORAL
  Filled 2017-08-08: qty 1

## 2017-08-08 MED ORDER — LEVALBUTEROL HCL 0.63 MG/3ML IN NEBU
0.6300 mg | INHALATION_SOLUTION | Freq: Two times a day (BID) | RESPIRATORY_TRACT | Status: DC
  Administered 2017-08-09 – 2017-08-11 (×5): 0.63 mg via RESPIRATORY_TRACT
  Filled 2017-08-08 (×5): qty 3

## 2017-08-08 NOTE — Progress Notes (Signed)
PROGRESS NOTE                                                                                                                                                                                                             Patient Demographics:    Bryce Mendoza, is a 57 y.o. male, DOB - Jul 22, 1960, ZOX:[id number]  Admit date - 08/04/2017   Admitting Physician Narda Bonds, MD  Outpatient Primary MD for the patient is Patient, No Pcp Per  LOS - 3   Chief Complaint  Patient presents with  . Chest Pain       Brief Narrative   56 year old male without significant past medical history, presents with dyspnea, work-up significant for left-sided pleural effusion, status post thoracentesis with 1.8 L drained, with residual loculated pleural effusion present, work-up significant for exudative pleural effusion/empyema, patient was seen by CT surgery, went for VATS/decortication procedure 08/06/2017.    Subjective:    Bryce Mendoza today has, No headache, No chest pain, No abdominal pain -denies any fever or chills  Assessment  & Plan :    Active Problems:   Pleural effusion on left   Large left pleural effusion/empyema -Status post ultrasound-guided thoracentesis, 1.8 L drained, work-up significant for exudate, but cytology has been negative for malignancy, and so far Gram stain has been negative, with no growth, but it had significant white blood cell count of 26,000 . -CT surgery consult greatly appreciated, he went for video bronchoscopy/VATS and thoracotomy with decortication and empyema drainage by CT surgery on 08/06/2017, so far Gram stain cultures negative with no growth, so far intraoperative cultures Gram stain are negative, with no growth, bronchial lavage cultures/AFB still pending. -Empirically on IV Rocephin and azithromycin, discussed with ID, recommendation to include anaerobic coverage for presumed aspiration, he was changed to Unasyn, continue with  IV Unasyn, will adjust antibiotics as needed pending cultures result. -Chest tube management per general surgery, for last 24 hours, chest tube on suction.  Hypokalemia -Repleted,   Polycythemia -Likely secondary polycythemia related to smoking, trending down with hydration  Hyponatremia -Mild, improving after stopping 1/2 NS       Code Status : Full code  Family Communication  : None at bedside  Disposition Plan  : Home when stable  Consults  :  CT surgery  Procedures  : Ultrasound-guided thoracentesis,  -  VIDEO BRONCHOSCOPY (N/A), VIDEO ASSISTED THORACOSCOPY (VATS)/EMPYEMA, MINI THORACOTOMY (Left), EMPYEMA DRAINAGE, DECORTICATION    DVT Prophylaxis  :  Equality LOENOX,   Lab Results  Component Value Date   PLT 347 08/08/2017    Antibiotics  :    Anti-infectives (From admission, onward)   Start     Dose/Rate Route Frequency Ordered Stop   08/06/17 1300  ceFAZolin (ANCEF) IVPB 2g/100 mL premix     2 g 200 mL/hr over 30 Minutes Intravenous To Short Stay 08/06/17 1256 08/06/17 1716   08/06/17 1200  Ampicillin-Sulbactam (UNASYN) 3 g in sodium chloride 0.9 % 100 mL IVPB     3 g 200 mL/hr over 30 Minutes Intravenous Every 6 hours 08/06/17 1052     08/04/17 2000  cefTRIAXone (ROCEPHIN) 1 g in sodium chloride 0.9 % 100 mL IVPB  Status:  Discontinued     1 g 200 mL/hr over 30 Minutes Intravenous Every 24 hours 08/04/17 1819 08/04/17 1836   08/04/17 2000  azithromycin (ZITHROMAX) tablet 500 mg  Status:  Discontinued     500 mg Oral Daily 08/04/17 1819 08/06/17 1042   08/04/17 2000  cefTRIAXone (ROCEPHIN) 2 g in sodium chloride 0.9 % 100 mL IVPB  Status:  Discontinued     2 g 200 mL/hr over 30 Minutes Intravenous Every 24 hours 08/04/17 1836 08/06/17 1043        Objective:   Vitals:   08/08/17 0800 08/08/17 0806 08/08/17 0907 08/08/17 0957  BP:  129/86    Pulse:  78    Resp: (!) 27 (!) 27  (!) 27  Temp:  97.8 F (36.6 C)    TempSrc:  Oral    SpO2: 96% 96% 95% 95%    Weight:      Height:        Wt Readings from Last 3 Encounters:  08/06/17 117 kg (257 lb 15 oz)     Intake/Output Summary (Last 24 hours) at 08/08/2017 1020 Last data filed at 08/08/2017 0900 Gross per 24 hour  Intake 1110.83 ml  Output 1165 ml  Net -54.17 ml     Physical Exam  Awake Alert, Oriented X 3, No new F.N deficits, Normal affect Symmetrical Chest wall movement, Good air movement bilaterally, CTAB, chest tube in left lower chest  RRR,No Gallops,Rubs or new Murmurs, No Parasternal Heave +ve B.Sounds, Abd Soft, No tenderness, No organomegaly appriciated, No rebound - guarding or rigidity. No Cyanosis, Clubbing or edema, No new Rash or bruise       Data Review:    CBC Recent Labs  Lab 08/05/17 0741 08/06/17 0523 08/06/17 1312 08/07/17 0330 08/08/17 0754  WBC 10.4 9.0 9.5 13.5* 10.9*  HGB 18.1* 17.1* 18.1* 17.3* 16.3  HCT 51.5 49.4 51.9 49.2 48.1  PLT 279 303 308 344 347  MCV 99.2 101.4* 101.0* 99.8 103.2*  MCH 34.9* 35.1* 35.2* 35.1* 35.0*  MCHC 35.1 34.6 34.9 35.2 33.9  RDW 13.4 13.7 13.9 13.7 13.8    Chemistries  Recent Labs  Lab 08/04/17 1159 08/05/17 0741 08/06/17 0523 08/06/17 1312 08/07/17 0330 08/08/17 0754  NA 134* 136 138 135 134* 135  K 2.8* 3.1* 3.9 4.0 4.3 3.7  CL 101 100* 104 108 108 107  CO2 15* 22 22 14* 16* 19*  GLUCOSE 143* 119* 107* 115* 161* 107*  BUN 24* 18 15 15 11 11   CREATININE 1.11 1.07 0.93 0.79 0.79 0.80  CALCIUM 8.9 8.8* 8.5* 8.4* 8.1* 8.0*  MG 1.8  --   --   --   --   --  AST  --  26  --  35  --  18  ALT  --  18  --  24  --  16*  ALKPHOS  --  93  --  89  --  73  BILITOT  --  1.4*  --  0.9  --  0.6   ------------------------------------------------------------------------------------------------------------------ No results for input(s): CHOL, HDL, LDLCALC, TRIG, CHOLHDL, LDLDIRECT in the last 72 hours.  No results found for:  HGBA1C ------------------------------------------------------------------------------------------------------------------ No results for input(s): TSH, T4TOTAL, T3FREE, THYROIDAB in the last 72 hours.  Invalid input(s): FREET3 ------------------------------------------------------------------------------------------------------------------ No results for input(s): VITAMINB12, FOLATE, FERRITIN, TIBC, IRON, RETICCTPCT in the last 72 hours.  Coagulation profile Recent Labs  Lab 08/04/17 1159 08/05/17 0741 08/06/17 1312  INR 1.19 1.23 1.14    No results for input(s): DDIMER in the last 72 hours.  Cardiac Enzymes No results for input(s): CKMB, TROPONINI, MYOGLOBIN in the last 168 hours.  Invalid input(s): CK ------------------------------------------------------------------------------------------------------------------ No results found for: BNP  Inpatient Medications  Scheduled Meds: . acetaminophen  1,000 mg Oral Q6H   Or  . acetaminophen (TYLENOL) oral liquid 160 mg/5 mL  1,000 mg Oral Q6H  . bisacodyl  10 mg Oral Daily  . enoxaparin (LOVENOX) injection  40 mg Subcutaneous Q24H  . fentaNYL   Intravenous Q4H  . guaiFENesin  1,200 mg Oral BID  . ketoconazole   Topical BID  . levalbuterol  0.63 mg Nebulization TID  . polyethylene glycol  17 g Oral BID  . senna-docusate  2 tablet Oral BID  . sodium chloride flush  3 mL Intravenous Q12H   Continuous Infusions: . sodium chloride 10 mL/hr at 08/07/17 0840  . ampicillin-sulbactam (UNASYN) IV 3 g (08/08/17 1610)  . potassium chloride     PRN Meds:.sodium chloride, diphenhydrAMINE **OR** diphenhydrAMINE, HYDROcodone-acetaminophen, levalbuterol, naloxone **AND** sodium chloride flush, ondansetron (ZOFRAN) IV, oxyCODONE, potassium chloride, sodium chloride flush, traMADol  Micro Results Recent Results (from the past 240 hour(s))  Body fluid culture     Status: None (Preliminary result)   Collection Time: 08/04/17  2:11 PM   Result Value Ref Range Status   Specimen Description   Final    PLEURAL LEFT Performed at [facility], [address], [place], [place] [address]    Special Requests   Final    NONE Performed at [facility], [address], [place], [place] [address]    Gram Stain   Final    ABUNDANT WBC PRESENT, PREDOMINANTLY PMN NO ORGANISMS SEEN    Culture   Final    NO GROWTH 3 DAYS Performed at [facility], [address], [place], [place] [address]    Report Status PENDING  Incomplete  Acid Fast Smear (AFB)     Status: None   Collection Time: 08/04/17  2:11 PM  Result Value Ref Range Status   AFB Specimen Processing Concentration  Final   Acid Fast Smear Negative  Final    Comment: (NOTE) Performed At: [facility] [address] [place], [place] [id number] Jolene Schimke MD QM:[id number]    Source (AFB) PLEURAL  Final    Comment: LEFT Performed at [facility], [address], [place], [place] [address]   Blood culture (routine x 2)     Status: None (Preliminary result)   Collection Time: 08/04/17  2:36 PM  Result Value Ref Range Status   Specimen Description   Final    BLOOD LEFT ANTECUBITAL Performed at [facility], [address], [place], [place] [address]    Special Requests   Final    BOTTLES DRAWN AEROBIC AND ANAEROBIC Blood Culture adequate volume Performed at [facility], [address], [place], [place] [address]    Culture   Final    NO GROWTH 3 DAYS Performed at [facility], [address], [place], [place] [address]    Report Status PENDING  Incomplete  Blood culture (routine x 2)     Status: None (Preliminary result)   Collection Time: 08/04/17  3:05 PM  Result Value Ref Range Status   Specimen Description   Final    BLOOD RIGHT ANTECUBITAL Performed at [facility], [address], [place], [place] [address]     Special Requests   Final    BOTTLES DRAWN AEROBIC AND ANAEROBIC Blood Culture adequate volume Performed at [facility], [address] [place], [place] [address]    Culture   Final    NO GROWTH 3 DAYS Performed at [facility], [address], [place], [place] [address]    Report Status PENDING  Incomplete  MRSA PCR Screening     Status: None   Collection Time: 08/06/17  3:58 PM  Result Value Ref Range Status   MRSA by PCR NEGATIVE NEGATIVE Final    Comment:        The GeneXpert MRSA Assay (FDA approved for NASAL specimens only), is one component of a comprehensive MRSA colonization surveillance program. It is not intended to diagnose MRSA infection nor to guide or monitor treatment for MRSA infections. Performed at [facility], [address] [place], [place] [address]   Culture, respiratory (NON-Expectorated)     Status: None (Preliminary result)   Collection Time: 08/06/17  5:29 PM  Result Value Ref Range Status   Specimen Description BRONCHIAL WASHINGS LEFT  Final   Special Requests NONE  Final   Gram Stain   Final    RARE WBC PRESENT, PREDOMINANTLY PMN NO ORGANISMS SEEN Performed at [facility], [address], [place], [place] [address]    Culture PENDING  Incomplete   Report Status PENDING  Incomplete  Aerobic/Anaerobic Culture (surgical/deep wound)     Status: None (Preliminary result)   Collection Time: 08/06/17  6:20 PM  Result Value Ref Range Status   Specimen Description FLUID PLEURAL LEFT  Final   Special Requests NONE  Final   Gram Stain   Final    ABUNDANT WBC PRESENT, PREDOMINANTLY PMN NO ORGANISMS SEEN    Culture   Final    NO GROWTH < 24 HOURS Performed at [facility], [address] [place], [place] [address]    Report Status PENDING  Incomplete  Aerobic/Anaerobic Culture (surgical/deep wound)     Status: None (Preliminary result)   Collection Time: 08/06/17  6:29 PM  Result Value Ref Range  Status   Specimen Description TISSUE LEFT PLEURAL  Final   Special Requests PLEURAL PEEL  Final   Gram Stain   Final    ABUNDANT WBC PRESENT, PREDOMINANTLY PMN NO ORGANISMS SEEN Performed at [facility], [address] [place], [place] [address]    Culture PENDING  Incomplete   Report Status PENDING  Incomplete    Radiology Reports Dg Chest 1 View  Result Date: 08/04/2017 CLINICAL DATA:  Status post LEFT thoracentesis. EXAM: CHEST  1 VIEW COMPARISON:  08/04/2017 radiographs FINDINGS: LEFT  pleural effusion has decreased in size, now moderate to large. There is no evidence of pneumothorax. RIGHT lung is clear. The cardiomediastinal silhouette is unchanged. IMPRESSION: Decreased LEFT pleural effusion, now moderate to large. No pneumothorax. Electronically Signed   By: Harmon Pier M.D.   On: 08/04/2017 14:39   Dg Chest 2 View  Result Date: 08/06/2017 CLINICAL DATA:  Pleural effusion. EXAM: CHEST - 2 VIEW COMPARISON:  August 04, 2017 FINDINGS: The left-sided pleural effusion with underlying opacity is stable. No pneumothorax. The cardiomediastinal silhouette is unchanged. The right lung is clear. No other abnormalities. IMPRESSION: Stable left effusion. Electronically Signed   By: Gerome Sam III M.D   On: 08/06/2017 09:37   Dg Chest 2 View  Result Date: 08/04/2017 CLINICAL DATA:  Chest pain, shortness of breath. EXAM: CHEST - 2 VIEW COMPARISON:  None. FINDINGS: The heart size and mediastinal contours are within normal limits. Right lung is clear. No pneumothorax is noted. Interval development of large left pleural effusion is noted with probable underlying atelectasis or infiltrate. The visualized skeletal structures are unremarkable. IMPRESSION: Interval development of large left pleural effusion with probable underlying atelectasis or infiltrate. Electronically Signed   By: Lupita Raider, M.D.   On: 08/04/2017 12:50   Ct Chest W Contrast  Result Date: 08/04/2017 CLINICAL DATA:   Dyspnea with left-sided chest pain on 11 days ago. Thoracentesis earlier today. EXAM: CT CHEST WITH CONTRAST TECHNIQUE: Multidetector CT imaging of the chest was performed during intravenous contrast administration. CONTRAST:  75mL ISOVUE-300 IOPAMIDOL (ISOVUE-300) INJECTION 61% COMPARISON:  Chest radiographs earlier today FINDINGS: Cardiovascular: Suboptimal contrast bolus. No acute vascular findings are seen. There is mild coronary artery atherosclerosis. The heart size is normal. There is no pericardial effusion. Mediastinum/Nodes: There are no enlarged mediastinal, hilar or axillary lymph nodes.There are scattered small mediastinal lymph nodes. There are also prominent left internal mammary lymph nodes, measuring to 10 mm short axis on image 55/2. The thyroid gland, trachea and esophagus demonstrate no significant findings. Lungs/Pleura: There is a moderate size residual left pleural effusion which is loculated laterally and with associated pleural thickening. No pleural based mass identified on noncontrast imaging. There is no pneumothorax or right-sided pleural effusion. There is compressive left lung atelectasis, especially in the lower lobe. The right lung is well aerated with patchy ground-glass opacities, especially in the upper lobe. No pulmonary nodule or endobronchial lesion seen. Upper abdomen: Borderline hepatic steatosis without apparent focal hepatic abnormality. The adrenal glands appear normal. Portacaval node measuring 17 mm short axis on image 165/2. Musculoskeletal/Chest wall: There is no chest wall mass or suspicious osseous finding. IMPRESSION: 1. Moderate size residual loculated left pleural effusion is suboptimally characterized without contrast, although is associated with some pleural thickening, suggesting a possible exudate. Correlate with thoracentesis results. 2. Resulting compressive left lung atelectasis, especially in the lower lobe. No endobronchial lesion or suspicious nodule  within the aerated lungs. 3. Patchy right lung ground-glass opacities (mosaic attenuation), likely related to small airways disease or edema. No evidence of pulmonary embolism on nondedicated imaging. 4. Enlarged left internal mammary and portacaval lymph nodes are nonspecific and possibly reactive. No mediastinal adenopathy. Electronically Signed   By: Carey Bullocks M.D.   On: 08/04/2017 16:35   Dg Chest Port 1 View  Result Date: 08/08/2017 CLINICAL DATA:  Chest tubes on the left.  Pleural effusion. EXAM: PORTABLE CHEST 1 VIEW COMPARISON:  August 07, 2017 FINDINGS: There are 2 chest tubes on the left. No pneumothorax. There is  loculated pleural effusion on the left, stable. There is patchy consolidation in the left base. Right lung is clear. Heart is mildly enlarged with pulmonary vascularity normal. No adenopathy. No bone lesions. IMPRESSION: Chest tubes present on the left without pneumothorax. Loculated pleural effusion on the left remains with consolidation left base. Right lung clear. No new opacity. Stable cardiac enlargement. Electronically Signed   By: Bretta BangWilliam  Woodruff III M.D.   On: 08/08/2017 07:56   Dg Chest Port 1 View  Result Date: 08/07/2017 CLINICAL DATA:  Shortness of breath today.  Chest tube present. EXAM: PORTABLE CHEST 1 VIEW COMPARISON:  Chest x-rays dated 08/06/2017 in 08/04/2017. Chest CT dated 08/04/2017. FINDINGS: Two LEFT-sided chest tubes are stable in position. The small loculated LEFT pleural effusion and/or pleural thickening is stable compared to yesterday's most recent chest x-ray, improved compared to earlier exams. Probable associated atelectasis at the LEFT lung base. RIGHT lung is clear. Stable cardiomegaly. IMPRESSION: No change compared to yesterday's most recent chest x-ray. The small loculated LEFT pleural effusion and/or pleural thickening is unchanged. LEFT-sided chest tubes are stable in position. Electronically Signed   By: Bary RichardStan  Maynard M.D.   On: 08/07/2017 09:09    Dg Chest Port 1 View  Result Date: 08/06/2017 CLINICAL DATA:  Status post VATS with empyema drainage/decortication EXAM: PORTABLE CHEST 1 VIEW COMPARISON:  08/06/2017 at 0701 hours FINDINGS: Postsurgical changes in the left hemithorax with small loculated left pleural effusion/pleural thickening, significantly improved. Two indwelling left chest tubes. No pneumothorax. Right lung is essentially clear. The heart is normal in size. IMPRESSION: Postsurgical changes in the left hemithorax with small loculated left pleural effusion/pleural thickening, significantly improved. Two indwelling left chest tube.  No pneumothorax. Electronically Signed   By: Charline BillsSriyesh  Krishnan M.D.   On: 08/06/2017 20:39   [place] Thoracentesis Asp Pleural Space W/img Guide  Result Date: 08/04/2017 INDICATION: Patient with history of worsening left-sided chest discomfort, exertional dyspnea, and left-sided pleural effusion. Request is made for diagnostic and therapeutic thoracentesis. EXAM: ULTRASOUND GUIDED DIAGNOSTIC AND THERAPEUTIC LEFT THORACENTESIS MEDICATIONS: 10 milliliters of 1% lidocaine COMPLICATIONS: None immediate. PROCEDURE: An ultrasound guided thoracentesis was thoroughly discussed with the patient and questions answered. The benefits, risks, alternatives and complications were also discussed. The patient understands and wishes to proceed with the procedure. Written consent was obtained. Ultrasound was performed to localize and mark an adequate pocket of fluid in the left chest. The area was then prepped and draped in the normal sterile fashion. 1% Lidocaine was used for local anesthesia. Under ultrasound guidance a 6 Fr Safe-T-Centesis catheter was introduced. Thoracentesis was performed. The catheter was removed and a dressing applied. FINDINGS: A total of approximately 1.8 liters of hazy amber fluid was removed. Samples were sent to the laboratory as requested by the clinical team. IMPRESSION: Successful ultrasound guided  left thoracentesis yielding 1.8 liters of pleural fluid. Read by: Elwin MochaAlexandra Louk, PA-C Electronically Signed   By: Jolaine ClickArthur  Hoss M.D.   On: 08/04/2017 14:42    Huey Bienenstockawood Elgergawy M.D on 08/08/2017 at 10:20 AM  Between 7am to 7pm - Pager - [phone]  After 7pm go to [email or web address] - password [email or web address]  [facility] -  Office  [phone]

## 2017-08-08 NOTE — Progress Notes (Signed)
Anterior chest tube removed tolerated well, present chest tube on water seal, denied of any discomfort.

## 2017-08-08 NOTE — Progress Notes (Addendum)
      [address]       [place] [id number]             [phone]       2 Days Post-Op Procedure(s) (LRB): VIDEO BRONCHOSCOPY (N/A) VIDEO ASSISTED THORACOSCOPY (VATS)/EMPYEMA, MINI THORACOTOMY (Left) EMPYEMA DRAINAGE DECORTICATION  Subjective: Patient with not much appetite this am. He states he is using PCA at night and Oxycodone during the day.  Objective: Vital signs in last 24 hours: Temp:  [97.6 F (36.4 C)-98.3 F (36.8 C)] 97.8 F (36.6 C) (06/08 0806) Pulse Rate:  [73-80] 78 (06/08 0806) Cardiac Rhythm: Normal sinus rhythm (06/08 0700) Resp:  [20-34] 27 (06/08 0806) BP: (108-129)/(74-90) 129/86 (06/08 0806) SpO2:  [96 %-98 %] 96 % (06/08 0806)    Intake/Output from previous day: 06/07 0701 - 06/08 0700 In: 1103.8 [P.O.:480; I.V.:223.8; IV Piggyback:400] Out: 1065 [Urine:835; Chest Tube:230]   Physical Exam:  Cardiovascular: RRR Pulmonary: Clear to auscultation on the right and diminished at left base Abdomen: Soft, non tender, bowel sounds present. Extremities: Trace  bilateral lower extremity edema. Wounds: Dressing is cean and dry.  No erythema or signs of infection. Chest Tubes: to suction, no air leak  Lab Results: CBC: Recent Labs    08/06/17 1312 08/07/17 0330  WBC 9.5 13.5*  HGB 18.1* 17.3*  HCT 51.9 49.2  PLT 308 344   BMET:  Recent Labs    08/06/17 1312 08/07/17 0330  NA 135 134*  K 4.0 4.3  CL 108 108  CO2 14* 16*  GLUCOSE 115* 161*  BUN 15 11  CREATININE 0.79 0.79  CALCIUM 8.4* 8.1*    PT/INR:  Recent Labs    08/06/17 1312  LABPROT 14.5  INR 1.14   ABG:  INR: Will add last result for INR, ABG once components are confirmed Will add last 4 CBG results once components are confirmed  Assessment/Plan:  1. CV - SR in the 70's 2.  Pulmonary - Chest tubes with 230 cc of output last 24 hours. Chest tubes are to suction. There is no air leak. CXR this am shows no pneumothorax, left base consolidation  (small effusion), and right lung is clear. As discussed with Dr. Donata ClayVan Trigt, will remove anterior chest tube. Left pleural fluid shows no growth to date, no organisms. Respiratory culture is pending.  Encourage incentive spirometer. 3. Await this am's lab results  Donielle M ZimmermanPA-C 08/08/2017,8:38 AM   patient examined and medical record reviewed,agree with above note. Kathlee Nationseter Van Trigt III 08/08/2017

## 2017-08-08 NOTE — Progress Notes (Signed)
Encouraged to ambulate along the hallway but claimed to go to sleep since no enough sleep last night,  claimed to have it done this pm. Explained the significance of ambulation, perceptive  with education.

## 2017-08-09 ENCOUNTER — Inpatient Hospital Stay (HOSPITAL_COMMUNITY): Payer: Self-pay

## 2017-08-09 LAB — BASIC METABOLIC PANEL
Anion gap: 10 (ref 5–15)
BUN: 8 mg/dL (ref 6–20)
CO2: 18 mmol/L — ABNORMAL LOW (ref 22–32)
Calcium: 8.1 mg/dL — ABNORMAL LOW (ref 8.9–10.3)
Chloride: 109 mmol/L (ref 101–111)
Creatinine, Ser: 0.7 mg/dL (ref 0.61–1.24)
GFR calc Af Amer: >60 mL/min (ref >60)
GFR calc non Af Amer: >60 mL/min (ref >60)
Glucose, Bld: 93 mg/dL (ref 65–99)
Potassium: 3.3 mmol/L — ABNORMAL LOW (ref 3.5–5.1)
Sodium: 137 mmol/L (ref 135–145)

## 2017-08-09 LAB — CBC
HCT: 48.1 % (ref 39.0–52.0)
Hemoglobin: 16.4 g/dL (ref 13.0–17.0)
MCH: 34.8 pg — ABNORMAL HIGH (ref 26.0–34.0)
MCHC: 34.1 g/dL (ref 30.0–36.0)
MCV: 102.1 fL — ABNORMAL HIGH (ref 78.0–100.0)
Platelets: 339 10*3/uL (ref 150–400)
RBC: 4.71 MIL/uL (ref 4.22–5.81)
RDW: 13.8 % (ref 11.5–15.5)
WBC: 9.1 10*3/uL (ref 4.0–10.5)

## 2017-08-09 LAB — CULTURE, BLOOD (ROUTINE X 2)
Culture: NO GROWTH
Culture: NO GROWTH
SPECIAL REQUESTS: ADEQUATE
Special Requests: ADEQUATE

## 2017-08-09 LAB — CULTURE, RESPIRATORY: CULTURE: NO GROWTH

## 2017-08-09 LAB — CULTURE, RESPIRATORY W GRAM STAIN

## 2017-08-09 MED ORDER — POTASSIUM CHLORIDE CRYS ER 20 MEQ PO TBCR
30.0000 meq | EXTENDED_RELEASE_TABLET | Freq: Once | ORAL | Status: AC
  Administered 2017-08-09: 30 meq via ORAL
  Filled 2017-08-09: qty 1

## 2017-08-09 MED ORDER — POTASSIUM CHLORIDE CRYS ER 10 MEQ PO TBCR
30.0000 meq | EXTENDED_RELEASE_TABLET | Freq: Once | ORAL | Status: AC
  Administered 2017-08-09: 30 meq via ORAL
  Filled 2017-08-09: qty 1

## 2017-08-09 NOTE — Progress Notes (Signed)
PROGRESS NOTE                                                                                                                                                                                                             Patient Demographics:    Bryce Mendoza, is a 57 y.o. male, DOB - [birth year], WUJ:[id number]  Admit date - 08/04/2017   Admitting Physician Narda Bonds, MD  Outpatient Primary MD for the patient is Patient, No Pcp Per  LOS - 4   Chief Complaint  Patient presents with  . Chest Pain       Brief Narrative   57 year old male without significant past medical history, presents with dyspnea, work-up significant for left-sided pleural effusion, status post thoracentesis with 1.8 L drained, with residual loculated pleural effusion present, work-up significant for exudative pleural effusion/empyema, patient was seen by CT surgery, went for VATS/decortication procedure 08/06/2017.    Subjective:    Bryce Mendoza today has, No headache, No chest pain, No abdominal pain , ports he had a bowel movement yesterday, no further constipation, pain is controlled.  Assessment  & Plan :    Active Problems:   Pleural effusion on left   Large left pleural effusion/empyema -Status post ultrasound-guided thoracentesis, 1.8 L drained, work-up significant for exudate, but cytology has been negative for malignancy, and so far Gram stain has been negative, with no growth, but it had significant white blood cell count of 26,000 . -CT surgery consult greatly appreciated, he went for video bronchoscopy/VATS and thoracotomy with decortication and empyema drainage by CT surgery on 08/06/2017, so far Gram stain cultures negative with no growth, so far intraoperative cultures Gram stain are negative, with no growth, bronchial lavage cultures Gram stain are negative, AFB is negative as well, culture still pending. -Empirically on IV Rocephin and azithromycin, discussed with  ID, recommendation to include anaerobic coverage for presumed aspiration, he was changed to Unasyn, continue with IV Unasyn, so far cultures are negative, can be transitioned to Augmentin on discharge . -chest tube management per CT surgery, anterior chest tube discontinued yesterday, currently on waterseal, 170 cc output over last 12 hours, possible DC chest tube in a.m. per CT surgery.  Hypokalemia -Repleted,   Polycythemia -Likely secondary polycythemia related to smoking, trending down with hydration  Hyponatremia -Resolved  Code Status : Full code  Family Communication  : None at bedside  Disposition Plan  : Home when stable  Consults  :  CT surgery  Procedures  : Ultrasound-guided thoracentesis,  - VIDEO BRONCHOSCOPY (N/A), VIDEO ASSISTED THORACOSCOPY (VATS)/EMPYEMA, MINI THORACOTOMY (Left), EMPYEMA DRAINAGE, DECORTICATION    DVT Prophylaxis  :  [place] LOENOX,   Lab Results  Component Value Date   PLT 339 08/09/2017    Antibiotics  :    Anti-infectives (From admission, onward)   Start     Dose/Rate Route Frequency Ordered Stop   08/06/17 1300  ceFAZolin (ANCEF) IVPB 2g/100 mL premix     2 g 200 mL/hr over 30 Minutes Intravenous To Short Stay 08/06/17 1256 08/06/17 1716   08/06/17 1200  Ampicillin-Sulbactam (UNASYN) 3 g in sodium chloride 0.9 % 100 mL IVPB     3 g 200 mL/hr over 30 Minutes Intravenous Every 6 hours 08/06/17 1052     08/04/17 2000  cefTRIAXone (ROCEPHIN) 1 g in sodium chloride 0.9 % 100 mL IVPB  Status:  Discontinued     1 g 200 mL/hr over 30 Minutes Intravenous Every 24 hours 08/04/17 1819 08/04/17 1836   08/04/17 2000  azithromycin (ZITHROMAX) tablet 500 mg  Status:  Discontinued     500 mg Oral Daily 08/04/17 1819 08/06/17 1042   08/04/17 2000  cefTRIAXone (ROCEPHIN) 2 g in sodium chloride 0.9 % 100 mL IVPB  Status:  Discontinued     2 g 200 mL/hr over 30 Minutes Intravenous Every 24 hours 08/04/17 1836 08/06/17 1043         Objective:   Vitals:   08/09/17 0403 08/09/17 0527 08/09/17 0800 08/09/17 0807  BP:    138/82  Pulse:    62  Resp: (!) 23  20 20   Temp:    97.7 F (36.5 C)  TempSrc:    Oral  SpO2: 96%  96% 96%  Weight:  113.4 kg (249 lb 14.4 oz)    Height:        Wt Readings from Last 3 Encounters:  08/09/17 113.4 kg (249 lb 14.4 oz)     Intake/Output Summary (Last 24 hours) at 08/09/2017 1047 Last data filed at 08/09/2017 0900 Gross per 24 hour  Intake 1600 ml  Output 870 ml  Net 730 ml     Physical Exam  Awake Alert, Oriented X 3, No new F.N deficits, Normal affect Symmetrical Chest wall movement, Good air movement bilaterally, CTAB, chest tube in left lower chest  RRR,No Gallops,Rubs or new Murmurs, No Parasternal Heave +ve B.Sounds, Abd Soft, No tenderness, No organomegaly appriciated, No rebound - guarding or rigidity. No Cyanosis, Clubbing or edema, No new Rash or bruise       Data Review:    CBC Recent Labs  Lab 08/06/17 0523 08/06/17 1312 08/07/17 0330 08/08/17 0754 08/09/17 0217  WBC 9.0 9.5 13.5* 10.9* 9.1  HGB 17.1* 18.1* 17.3* 16.3 16.4  HCT 49.4 51.9 49.2 48.1 48.1  PLT 303 308 344 347 339  MCV 101.4* 101.0* 99.8 103.2* 102.1*  MCH 35.1* 35.2* 35.1* 35.0* 34.8*  MCHC 34.6 34.9 35.2 33.9 34.1  RDW 13.7 13.9 13.7 13.8 13.8    Chemistries  Recent Labs  Lab 08/04/17 1159 08/05/17 0741 08/06/17 0523 08/06/17 1312 08/07/17 0330 08/08/17 0754 08/09/17 0217  NA 134* 136 138 135 134* 135 137  K 2.8* 3.1* 3.9 4.0 4.3 3.7 3.3*  CL 101 100* 104 108 108 107 109  CO2  15* 22 22 14* 16* 19* 18*  GLUCOSE 143* 119* 107* 115* 161* 107* 93  BUN 24* 18 15 15 11 11 8   CREATININE 1.11 1.07 0.93 0.79 0.79 0.80 0.70  CALCIUM 8.9 8.8* 8.5* 8.4* 8.1* 8.0* 8.1*  MG 1.8  --   --   --   --   --   --   AST  --  26  --  35  --  18  --   ALT  --  18  --  24  --  16*  --   ALKPHOS  --  93  --  89  --  73  --   BILITOT  --  1.4*  --  0.9  --  0.6  --     ------------------------------------------------------------------------------------------------------------------ No results for input(s): CHOL, HDL, LDLCALC, TRIG, CHOLHDL, LDLDIRECT in the last 72 hours.  No results found for: HGBA1C ------------------------------------------------------------------------------------------------------------------ No results for input(s): TSH, T4TOTAL, T3FREE, THYROIDAB in the last 72 hours.  Invalid input(s): FREET3 ------------------------------------------------------------------------------------------------------------------ No results for input(s): VITAMINB12, FOLATE, FERRITIN, TIBC, IRON, RETICCTPCT in the last 72 hours.  Coagulation profile Recent Labs  Lab 08/04/17 1159 08/05/17 0741 08/06/17 1312  INR 1.19 1.23 1.14    No results for input(s): DDIMER in the last 72 hours.  Cardiac Enzymes No results for input(s): CKMB, TROPONINI, MYOGLOBIN in the last 168 hours.  Invalid input(s): CK ------------------------------------------------------------------------------------------------------------------ No results found for: BNP  Inpatient Medications  Scheduled Meds: . acetaminophen  1,000 mg Oral Q6H   Or  . acetaminophen (TYLENOL) oral liquid 160 mg/5 mL  1,000 mg Oral Q6H  . bisacodyl  10 mg Oral Daily  . enoxaparin (LOVENOX) injection  40 mg Subcutaneous Q24H  . fentaNYL   Intravenous Q4H  . guaiFENesin  1,200 mg Oral BID  . ketoconazole   Topical BID  . levalbuterol  0.63 mg Nebulization BID  . polyethylene glycol  17 g Oral BID  . senna-docusate  2 tablet Oral BID  . sodium chloride flush  3 mL Intravenous Q12H   Continuous Infusions: . sodium chloride 10 mL/hr at 08/07/17 0840  . ampicillin-sulbactam (UNASYN) IV Stopped (08/09/17 9604)  . potassium chloride     PRN Meds:.sodium chloride, diphenhydrAMINE **OR** diphenhydrAMINE, HYDROcodone-acetaminophen, levalbuterol, naloxone **AND** sodium chloride flush,  ondansetron (ZOFRAN) IV, oxyCODONE, potassium chloride, sodium chloride flush, traMADol  Micro Results Recent Results (from the past 240 hour(s))  Body fluid culture     Status: None   Collection Time: 08/04/17  2:11 PM  Result Value Ref Range Status   Specimen Description   Final    PLEURAL LEFT Performed at [facility], [address] [place], [place] [address]    Special Requests   Final    NONE Performed at [facility], [address] [place], [place] [address]    Gram Stain   Final    ABUNDANT WBC PRESENT, PREDOMINANTLY PMN NO ORGANISMS SEEN    Culture   Final    NO GROWTH 3 DAYS Performed at [facility], [address], [place], [place] [address]    Report Status 08/08/2017 FINAL  Final  Acid Fast Smear (AFB)     Status: None   Collection Time: 08/04/17  2:11 PM  Result Value Ref Range Status   AFB Specimen Processing Concentration  Final   Acid Fast Smear Negative  Final    Comment: (NOTE) Performed At: [facility] [address] [place], [place] [id number]  Jolene Schimke MD ZO:[id number]    Source (AFB) PLEURAL  Final    Comment: LEFT Performed at [facility], [address], [place], [place] [address]   Blood culture (routine x 2)     Status: None (Preliminary result)   Collection Time: 08/04/17  2:36 PM  Result Value Ref Range Status   Specimen Description   Final    BLOOD LEFT ANTECUBITAL Performed at [facility], [address] [place], [place] [address]    Special Requests   Final    BOTTLES DRAWN AEROBIC AND ANAEROBIC Blood Culture adequate volume Performed at [facility], [address], [place], [place] [address]    Culture   Final    NO GROWTH 4 DAYS Performed at [facility], [address], [place], [place] [address]    Report Status PENDING  Incomplete  Blood culture (routine x 2)     Status: None (Preliminary result)    Collection Time: 08/04/17  3:05 PM  Result Value Ref Range Status   Specimen Description   Final    BLOOD RIGHT ANTECUBITAL Performed at [facility], [address] [place], [place] [address]    Special Requests   Final    BOTTLES DRAWN AEROBIC AND ANAEROBIC Blood Culture adequate volume Performed at [facility], [address], [place], [place] [address]    Culture   Final    NO GROWTH 4 DAYS Performed at [facility], [address] [place], [place] [address]    Report Status PENDING  Incomplete  MRSA PCR Screening     Status: None   Collection Time: 08/06/17  3:58 PM  Result Value Ref Range Status   MRSA by PCR NEGATIVE NEGATIVE Final    Comment:        The GeneXpert MRSA Assay (FDA approved for NASAL specimens only), is one component of a comprehensive MRSA colonization surveillance program. It is not intended to diagnose MRSA infection nor to guide or monitor treatment for MRSA infections. Performed at [facility], [address], [place], [place] [address]   Acid Fast Smear (AFB)     Status: None   Collection Time: 08/06/17  5:29 PM  Result Value Ref Range Status   AFB Specimen Processing Concentration  Final   Acid Fast Smear Negative  Final    Comment: (NOTE) Performed At: [facility] [address] [place], [place] [id number] Jolene Schimke MD HK:[id number]    Source (AFB) BRONCHIAL WASHINGS  Final    Comment: LEFT  Culture, respiratory (NON-Expectorated)     Status: None   Collection Time: 08/06/17  5:29 PM  Result Value Ref Range Status   Specimen Description BRONCHIAL WASHINGS LEFT  Final   Special Requests NONE  Final   Gram Stain   Final    RARE WBC PRESENT, PREDOMINANTLY PMN NO ORGANISMS SEEN    Culture   Final    NO GROWTH 2 DAYS Performed at [facility], [address] [place], [place] [address]    Report Status 08/09/2017 FINAL  Final  Aerobic/Anaerobic Culture  (surgical/deep wound)     Status: None (Preliminary result)   Collection Time: 08/06/17  6:20 PM  Result Value Ref Range Status   Specimen Description FLUID PLEURAL LEFT  Final   Special Requests NONE  Final   Gram Stain   Final    ABUNDANT WBC PRESENT, PREDOMINANTLY PMN NO ORGANISMS  SEEN    Culture   Final    NO GROWTH 2 DAYS NO ANAEROBES ISOLATED; CULTURE IN PROGRESS FOR 5 DAYS Performed at [facility], [address] [place], [place] [address]    Report Status PENDING  Incomplete  Aerobic/Anaerobic Culture (surgical/deep wound)     Status: None (Preliminary result)   Collection Time: 08/06/17  6:29 PM  Result Value Ref Range Status   Specimen Description TISSUE LEFT PLEURAL  Final   Special Requests PLEURAL PEEL  Final   Gram Stain   Final    ABUNDANT WBC PRESENT, PREDOMINANTLY PMN NO ORGANISMS SEEN    Culture   Final    NO GROWTH 2 DAYS Performed at [facility], [address], [place], [place] [address]    Report Status PENDING  Incomplete    Radiology Reports Dg Chest 1 View  Result Date: 08/04/2017 CLINICAL DATA:  Status post LEFT thoracentesis. EXAM: CHEST  1 VIEW COMPARISON:  08/04/2017 radiographs FINDINGS: LEFT pleural effusion has decreased in size, now moderate to large. There is no evidence of pneumothorax. RIGHT lung is clear. The cardiomediastinal silhouette is unchanged. IMPRESSION: Decreased LEFT pleural effusion, now moderate to large. No pneumothorax. Electronically Signed   By: Harmon Pier M.D.   On: 08/04/2017 14:39   Dg Chest 2 View  Result Date: 08/06/2017 CLINICAL DATA:  Pleural effusion. EXAM: CHEST - 2 VIEW COMPARISON:  August 04, 2017 FINDINGS: The left-sided pleural effusion with underlying opacity is stable. No pneumothorax. The cardiomediastinal silhouette is unchanged. The right lung is clear. No other abnormalities. IMPRESSION: Stable left effusion. Electronically Signed   By: Gerome Sam III M.D   On: 08/06/2017 09:37   Dg Chest 2  View  Result Date: 08/04/2017 CLINICAL DATA:  Chest pain, shortness of breath. EXAM: CHEST - 2 VIEW COMPARISON:  None. FINDINGS: The heart size and mediastinal contours are within normal limits. Right lung is clear. No pneumothorax is noted. Interval development of large left pleural effusion is noted with probable underlying atelectasis or infiltrate. The visualized skeletal structures are unremarkable. IMPRESSION: Interval development of large left pleural effusion with probable underlying atelectasis or infiltrate. Electronically Signed   By: Lupita Raider, M.D.   On: 08/04/2017 12:50   Ct Chest W Contrast  Result Date: 08/04/2017 CLINICAL DATA:  Dyspnea with left-sided chest pain on 11 days ago. Thoracentesis earlier today. EXAM: CT CHEST WITH CONTRAST TECHNIQUE: Multidetector CT imaging of the chest was performed during intravenous contrast administration. CONTRAST:  75mL ISOVUE-300 IOPAMIDOL (ISOVUE-300) INJECTION 61% COMPARISON:  Chest radiographs earlier today FINDINGS: Cardiovascular: Suboptimal contrast bolus. No acute vascular findings are seen. There is mild coronary artery atherosclerosis. The heart size is normal. There is no pericardial effusion. Mediastinum/Nodes: There are no enlarged mediastinal, hilar or axillary lymph nodes.There are scattered small mediastinal lymph nodes. There are also prominent left internal mammary lymph nodes, measuring to 10 mm short axis on image 55/2. The thyroid gland, trachea and esophagus demonstrate no significant findings. Lungs/Pleura: There is a moderate size residual left pleural effusion which is loculated laterally and with associated pleural thickening. No pleural based mass identified on noncontrast imaging. There is no pneumothorax or right-sided pleural effusion. There is compressive left lung atelectasis, especially in the lower lobe. The right lung is well aerated with patchy ground-glass opacities, especially in the upper lobe. No pulmonary nodule  or endobronchial lesion seen. Upper abdomen: Borderline hepatic steatosis without apparent focal hepatic abnormality. The adrenal glands appear  normal. Portacaval node measuring 17 mm short axis on image 165/2. Musculoskeletal/Chest wall: There is no chest wall mass or suspicious osseous finding. IMPRESSION: 1. Moderate size residual loculated left pleural effusion is suboptimally characterized without contrast, although is associated with some pleural thickening, suggesting a possible exudate. Correlate with thoracentesis results. 2. Resulting compressive left lung atelectasis, especially in the lower lobe. No endobronchial lesion or suspicious nodule within the aerated lungs. 3. Patchy right lung ground-glass opacities (mosaic attenuation), likely related to small airways disease or edema. No evidence of pulmonary embolism on nondedicated imaging. 4. Enlarged left internal mammary and portacaval lymph nodes are nonspecific and possibly reactive. No mediastinal adenopathy. Electronically Signed   By: Carey BullocksWilliam  Veazey M.D.   On: 08/04/2017 16:35   Dg Chest Port 1 View  Result Date: 08/09/2017 CLINICAL DATA:  Left empyema.  Follow-up pneumothorax. EXAM: PORTABLE CHEST 1 VIEW COMPARISON:  08/08/2017 FINDINGS: Left chest tube remains in place. Small left basilar pneumothorax is stable in size. Mild left pleural thickening remains stable as well as atelectasis in the left lung base. Right lung is clear. Heart size is within normal limits. IMPRESSION: Stable small left basilar pneumothorax and atelectasis. Electronically Signed   By: Myles RosenthalJohn  Stahl M.D.   On: 08/09/2017 10:14   Dg Chest Port 1 View  Result Date: 08/08/2017 CLINICAL DATA:  Chest tubes on the left.  Pleural effusion. EXAM: PORTABLE CHEST 1 VIEW COMPARISON:  August 07, 2017 FINDINGS: There are 2 chest tubes on the left. No pneumothorax. There is loculated pleural effusion on the left, stable. There is patchy consolidation in the left base. Right lung is  clear. Heart is mildly enlarged with pulmonary vascularity normal. No adenopathy. No bone lesions. IMPRESSION: Chest tubes present on the left without pneumothorax. Loculated pleural effusion on the left remains with consolidation left base. Right lung clear. No new opacity. Stable cardiac enlargement. Electronically Signed   By: Bretta BangWilliam  Woodruff III M.D.   On: 08/08/2017 07:56   Dg Chest Port 1 View  Result Date: 08/07/2017 CLINICAL DATA:  Shortness of breath today.  Chest tube present. EXAM: PORTABLE CHEST 1 VIEW COMPARISON:  Chest x-rays dated 08/06/2017 in 08/04/2017. Chest CT dated 08/04/2017. FINDINGS: Two LEFT-sided chest tubes are stable in position. The small loculated LEFT pleural effusion and/or pleural thickening is stable compared to yesterday's most recent chest x-ray, improved compared to earlier exams. Probable associated atelectasis at the LEFT lung base. RIGHT lung is clear. Stable cardiomegaly. IMPRESSION: No change compared to yesterday's most recent chest x-ray. The small loculated LEFT pleural effusion and/or pleural thickening is unchanged. LEFT-sided chest tubes are stable in position. Electronically Signed   By: Bary RichardStan  Maynard M.D.   On: 08/07/2017 09:09   Dg Chest Port 1 View  Result Date: 08/06/2017 CLINICAL DATA:  Status post VATS with empyema drainage/decortication EXAM: PORTABLE CHEST 1 VIEW COMPARISON:  08/06/2017 at 0701 hours FINDINGS: Postsurgical changes in the left hemithorax with small loculated left pleural effusion/pleural thickening, significantly improved. Two indwelling left chest tubes. No pneumothorax. Right lung is essentially clear. The heart is normal in size. IMPRESSION: Postsurgical changes in the left hemithorax with small loculated left pleural effusion/pleural thickening, significantly improved. Two indwelling left chest tube.  No pneumothorax. Electronically Signed   By: Charline BillsSriyesh  Krishnan M.D.   On: 08/06/2017 20:39   [place] Thoracentesis Asp Pleural Space  W/img Guide  Result Date: 08/04/2017 INDICATION: Patient with history of worsening left-sided chest discomfort, exertional dyspnea, and left-sided pleural effusion. Request is  made for diagnostic and therapeutic thoracentesis. EXAM: ULTRASOUND GUIDED DIAGNOSTIC AND THERAPEUTIC LEFT THORACENTESIS MEDICATIONS: 10 milliliters of 1% lidocaine COMPLICATIONS: None immediate. PROCEDURE: An ultrasound guided thoracentesis was thoroughly discussed with the patient and questions answered. The benefits, risks, alternatives and complications were also discussed. The patient understands and wishes to proceed with the procedure. Written consent was obtained. Ultrasound was performed to localize and mark an adequate pocket of fluid in the left chest. The area was then prepped and draped in the normal sterile fashion. 1% Lidocaine was used for local anesthesia. Under ultrasound guidance a 6 Fr Safe-T-Centesis catheter was introduced. Thoracentesis was performed. The catheter was removed and a dressing applied. FINDINGS: A total of approximately 1.8 liters of hazy amber fluid was removed. Samples were sent to the laboratory as requested by the clinical team. IMPRESSION: Successful ultrasound guided left thoracentesis yielding 1.8 liters of pleural fluid. Read by: Elwin Mocha, PA-C Electronically Signed   By: Jolaine Click M.D.   On: 08/04/2017 14:42    Huey Bienenstock M.D on 08/09/2017 at 10:47 AM  Between 7am to 7pm - Pager - [phone]  After 7pm go to [email or web address] - password [facility]  [facility] -  Office  [phone]

## 2017-08-09 NOTE — Progress Notes (Addendum)
      [address]       [place] [id number]             [phone]       3 Days Post-Op Procedure(s) (LRB): VIDEO BRONCHOSCOPY (N/A) VIDEO ASSISTED THORACOSCOPY (VATS)/EMPYEMA, MINI THORACOTOMY (Left) EMPYEMA DRAINAGE DECORTICATION  Subjective: Patient states had some numbness along left chest wall and below shoulder last night.  Objective: Vital signs in last 24 hours: Temp:  [97.7 F (36.5 C)-98.7 F (37.1 C)] 97.7 F (36.5 C) (06/09 0807) Pulse Rate:  [32-80] 62 (06/09 0807) Cardiac Rhythm: Normal sinus rhythm (06/09 0700) Resp:  [18-32] 20 (06/09 0807) BP: (106-138)/(78-92) 138/82 (06/09 0807) SpO2:  [95 %-100 %] 96 % (06/09 0403) FiO2 (%):  [95 %] 95 % (06/08 1723) Weight:  [249 lb 14.4 oz (113.4 kg)] 249 lb 14.4 oz (113.4 kg) (06/09 0527)    Intake/Output from previous day: 06/08 0701 - 06/09 0700 In: 1750 [P.O.:1440; I.V.:10; IV Piggyback:300] Out: 795 [Urine:625; Chest Tube:170]   Physical Exam:  Cardiovascular: RRR Pulmonary: Clear to auscultation on the right and diminished at left base Abdomen: Soft, non tender, bowel sounds present. Extremities: Trace  bilateral lower extremity edema. Wounds: Dressing is clean and dry.  Chest Tube: to water seal, no air leak  Lab Results: CBC: Recent Labs    08/08/17 0754 08/09/17 0217  WBC 10.9* 9.1  HGB 16.3 16.4  HCT 48.1 48.1  PLT 347 339   BMET:  Recent Labs    08/08/17 0754 08/09/17 0217  NA 135 137  K 3.7 3.3*  CL 107 109  CO2 19* 18*  GLUCOSE 107* 93  BUN 11 8  CREATININE 0.80 0.70  CALCIUM 8.0* 8.1*    PT/INR:  Recent Labs    08/06/17 1312  LABPROT 14.5  INR 1.14   ABG:  INR: Will add last result for INR, ABG once components are confirmed Will add last 4 CBG results once components are confirmed  Assessment/Plan:  1. CV - SR in the 70's 2.  Pulmonary - Chest tubes with 170 cc of output last 12 hours (220 for 24 hours from my last mark). Chest tubs is to  water seal. There is no air leak. CXR this am shows no pneumothorax, left base consolidation (small effusion), and right lung is clear. Hope to remove last chest tube in am if output decreased.  Left pleural fluid shows no growth to date, no organisms. Respiratory culture is pending.  Encourage incentive spirometer. 3. Supplement potassium  Donielle M ZimmermanPA-C 08/09/2017,8:17 AM   patient examined and medical record reviewed,agree with above note. Kathlee Nationseter Van Trigt III 08/09/2017

## 2017-08-09 NOTE — Progress Notes (Signed)
Encouraged ambulation but refused at this time claimed to have it done later.

## 2017-08-09 NOTE — Progress Notes (Signed)
Another attempt to let him ambulate along the hallway but  refused due to pain on his  Left side.  Encouraged to make use of fentanyl PCA.Continue to monitor.

## 2017-08-10 ENCOUNTER — Inpatient Hospital Stay (HOSPITAL_COMMUNITY): Payer: Self-pay

## 2017-08-10 DIAGNOSIS — Z9689 Presence of other specified functional implants: Secondary | ICD-10-CM

## 2017-08-10 LAB — CBC
HEMATOCRIT: 47.9 % (ref 39.0–52.0)
HEMOGLOBIN: 16.4 g/dL (ref 13.0–17.0)
MCH: 34.3 pg — ABNORMAL HIGH (ref 26.0–34.0)
MCHC: 34.2 g/dL (ref 30.0–36.0)
MCV: 100.2 fL — AB (ref 78.0–100.0)
Platelets: 353 10*3/uL (ref 150–400)
RBC: 4.78 MIL/uL (ref 4.22–5.81)
RDW: 13.6 % (ref 11.5–15.5)
WBC: 9.7 10*3/uL (ref 4.0–10.5)

## 2017-08-10 LAB — BASIC METABOLIC PANEL
ANION GAP: 9 (ref 5–15)
BUN: 5 mg/dL — AB (ref 6–20)
CHLORIDE: 111 mmol/L (ref 101–111)
CO2: 19 mmol/L — AB (ref 22–32)
Calcium: 8.3 mg/dL — ABNORMAL LOW (ref 8.9–10.3)
Creatinine, Ser: 0.66 mg/dL (ref 0.61–1.24)
GFR calc Af Amer: >60 mL/min (ref >60)
GLUCOSE: 100 mg/dL — AB (ref 65–99)
POTASSIUM: 4 mmol/L (ref 3.5–5.1)
Sodium: 139 mmol/L (ref 135–145)

## 2017-08-10 NOTE — Progress Notes (Signed)
PROGRESS NOTE    Bryce Mendoza  GNF:[id number] DOB: 1960/08/03 DOA: 08/04/2017 PCP: Patient, No Pcp Per    Brief Narrative:  57 year old male without significant past medical history, presents with dyspnea, work-up significant for left-sided pleural effusion, status post thoracentesis with 1.8 L drained, with residual loculated pleural effusion present, work-up significant for exudative pleural effusion/empyema, patient was seen by CT surgery, went for VATS/decortication procedure 08/06/2017.   Assessment & Plan:   Active Problems:   Pleural effusion on left   Large left pleural effusion / Empyema: Would explain his persistent sob. S/p US guided thoracentesis and about 1.8 lit drained, . Cytology neg for malignancy. Cultures have been negative so far.  CT surgery consulted and he underwent Bronchoscopy , left video assisted thoracoscopy, mini thoracotomy and drainage of the left empyema and decortication by DR Tyrone Sage on 08/06/17.  currently on IV Unasyn and plan to transition to augmentin on discharge.  chest tube management per CT surgery, anterior chest tube discontinued yesterday, currently on waterseal, 170 cc output over last 12 hours, possible DC chest tube today.  Pt afebrile and no leukocytosis.    Hypokalemia:  Replaced.   Hyponatremia: resolved.      DVT prophylaxis: lovenox.  Code Status: full code.  Family Communication: none at bedside.  Disposition Plan: pending improvement in the clinical picture.    Consultants:   Cardiothoracic surgery.    Procedures: Bronchoscopy , left video assisted thoracoscopy, mini thoracotomy and drainage of the left empyema and decortication by DR Tyrone Sage on 08/06/17   Antimicrobials: Unasyn   Subjective: Breathing has improved.   Objective: Vitals:   08/09/17 2341 08/10/17 0000 08/10/17 0400 08/10/17 0417  BP:   132/78   Pulse:      Resp: (!) 24 (!) 26 (!) 26 (!) 23  Temp:   98.8 F (37.1 C)   TempSrc:   Oral   SpO2:    93% 93%  Weight:      Height:        Intake/Output Summary (Last 24 hours) at 08/10/2017 0732 Last data filed at 08/10/2017 0648 Gross per 24 hour  Intake 980 ml  Output 1395 ml  Net -415 ml   Filed Weights   08/04/17 2056 08/06/17 2110 08/09/17 0527  Weight: 116.3 kg (256 lb 6.3 oz) 117 kg (257 lb 15 oz) 113.4 kg (249 lb 14.4 oz)    Examination:  General exam: Appears calm and comfortable  Respiratory system: diminished air entry at bases. No wheezing or rhonchi.  Chest tub ein place.  Cardiovascular system: S1 & S2 heard, RRR. No JVD, murmurs,No pedal edema. Gastrointestinal system: Abdomen is nondistended, soft and nontender. No organomegaly or masses felt. Normal bowel sounds heard. Central nervous system: Alert and oriented. No focal neurological deficits. Extremities: Symmetric 5 x 5 power. Skin: No rashes, lesions or ulcers Psychiatry:. Mood & affect appropriate.     Data Reviewed: I have personally reviewed following labs and imaging studies  CBC: Recent Labs  Lab 08/06/17 1312 08/07/17 0330 08/08/17 0754 08/09/17 0217 08/10/17 0234  WBC 9.5 13.5* 10.9* 9.1 9.7  HGB 18.1* 17.3* 16.3 16.4 16.4  HCT 51.9 49.2 48.1 48.1 47.9  MCV 101.0* 99.8 103.2* 102.1* 100.2*  PLT 308 344 347 339 353   Basic Metabolic Panel: Recent Labs  Lab 08/04/17 1159  08/06/17 1312 08/07/17 0330 08/08/17 0754 08/09/17 0217 08/10/17 0234  NA 134*   < > 135 134* 135 137 139  K 2.8*   < >  4.0 4.3 3.7 3.3* 4.0  CL 101   < > 108 108 107 109 111  CO2 15*   < > 14* 16* 19* 18* 19*  GLUCOSE 143*   < > 115* 161* 107* 93 100*  BUN 24*   < > 15 11 11 8  5*  CREATININE 1.11   < > 0.79 0.79 0.80 0.70 0.66  CALCIUM 8.9   < > 8.4* 8.1* 8.0* 8.1* 8.3*  MG 1.8  --   --   --   --   --   --    < > = values in this interval not displayed.   GFR: Estimated Creatinine Clearance: 139.3 mL/min (by C-G formula based on SCr of 0.66 mg/dL). Liver Function Tests: Recent Labs  Lab 08/05/17 0741  08/06/17 1312 08/08/17 0754  AST 26 35 18  ALT 18 24 16*  ALKPHOS 93 89 73  BILITOT 1.4* 0.9 0.6  PROT 6.8 6.0* 5.6*  ALBUMIN 2.2* 2.1* 1.9*   No results for input(s): LIPASE, AMYLASE in the last 168 hours. No results for input(s): AMMONIA in the last 168 hours. Coagulation Profile: Recent Labs  Lab 08/04/17 1159 08/05/17 0741 08/06/17 1312  INR 1.19 1.23 1.14   Cardiac Enzymes: No results for input(s): CKTOTAL, CKMB, CKMBINDEX, TROPONINI in the last 168 hours. BNP (last 3 results) No results for input(s): PROBNP in the last 8760 hours. HbA1C: No results for input(s): HGBA1C in the last 72 hours. CBG: No results for input(s): GLUCAP in the last 168 hours. Lipid Profile: No results for input(s): CHOL, HDL, LDLCALC, TRIG, CHOLHDL, LDLDIRECT in the last 72 hours. Thyroid Function Tests: No results for input(s): TSH, T4TOTAL, FREET4, T3FREE, THYROIDAB in the last 72 hours. Anemia Panel: No results for input(s): VITAMINB12, FOLATE, FERRITIN, TIBC, IRON, RETICCTPCT in the last 72 hours. Sepsis Labs: No results for input(s): PROCALCITON, LATICACIDVEN in the last 168 hours.  Recent Results (from the past 240 hour(s))  Body fluid culture     Status: None   Collection Time: 08/04/17  2:11 PM  Result Value Ref Range Status   Specimen Description   Final    PLEURAL LEFT Performed at [facility], [address] [place], [place] [address]    Special Requests   Final    NONE Performed at [facility], [address] [place], [place] [address]    Gram Stain   Final    ABUNDANT WBC PRESENT, PREDOMINANTLY PMN NO ORGANISMS SEEN    Culture   Final    NO GROWTH 3 DAYS Performed at [facility], [address] [place], [place] [address]    Report Status 08/08/2017 FINAL  Final  Acid Fast Smear (AFB)     Status: None   Collection Time: 08/04/17  2:11 PM  Result Value Ref Range Status   AFB Specimen Processing Concentration  Final     Acid Fast Smear Negative  Final    Comment: (NOTE) Performed At: [facility]  [address] [place], [place] [id number] Jolene SchimkeNagendra Sanjai MD IO:[id number]:[phone]    Source (AFB) PLEURAL  Final    Comment: LEFT Performed at [facility], [address] [place], [place] [address]   Blood culture (routine x 2)     Status: None   Collection Time: 08/04/17  2:36 PM  Result Value Ref Range Status   Specimen Description   Final    BLOOD LEFT ANTECUBITAL Performed at [facility], [address] [place], [place] [address]    Special Requests   Final    BOTTLES DRAWN AEROBIC AND ANAEROBIC Blood Culture adequate volume Performed at [facility], [address], [place], [place] [address]    Culture   Final    NO GROWTH 5 DAYS Performed at [facility], [address] [place], [place] [address]    Report Status 08/09/2017 FINAL  Final  Blood culture (routine x 2)     Status: None   Collection Time: 08/04/17  3:05 PM  Result Value Ref Range Status   Specimen Description   Final    BLOOD RIGHT ANTECUBITAL Performed at [facility], [address], [place], [place] [address]    Special Requests   Final    BOTTLES DRAWN AEROBIC AND ANAEROBIC Blood Culture adequate volume Performed at [facility], [address], [place], [place] [address]    Culture   Final    NO GROWTH 5 DAYS Performed at [facility], [address], [place], [place] [address]    Report Status 08/09/2017 FINAL  Final  MRSA PCR Screening     Status: None   Collection Time: 08/06/17  3:58 PM  Result Value Ref Range Status   MRSA by PCR NEGATIVE NEGATIVE Final    Comment:        The GeneXpert MRSA Assay (FDA approved for NASAL specimens only), is one component of a comprehensive MRSA colonization surveillance program. It is not intended to diagnose MRSA infection nor to guide or monitor treatment  for MRSA infections. Performed at [facility], [address] [place], [place] [address]   Anaerobic culture     Status: None (Preliminary result)   Collection Time: 08/06/17  5:29 PM  Result Value Ref Range Status   Specimen Description BRONCHIAL WASHINGS LEFT  Final   Special Requests   Final    NONE Performed at [facility], [address] [place], [place] [address]    Culture   Final    NO ANAEROBES ISOLATED; CULTURE IN PROGRESS FOR 5 DAYS   Report Status PENDING  Incomplete  Acid Fast Smear (AFB)     Status: None   Collection Time: 08/06/17  5:29 PM  Result Value Ref Range Status   AFB Specimen Processing Concentration  Final   Acid Fast Smear Negative  Final    Comment: (NOTE) Performed At: [facility] [address] [place], [place] [id number] Jolene Schimke MD NU:[id number]    Source (AFB) BRONCHIAL WASHINGS  Final    Comment: LEFT  Culture, respiratory (NON-Expectorated)     Status: None   Collection Time: 08/06/17  5:29 PM  Result Value Ref Range Status   Specimen Description BRONCHIAL WASHINGS LEFT  Final   Special Requests NONE  Final   Gram Stain   Final    RARE WBC PRESENT, PREDOMINANTLY PMN NO ORGANISMS SEEN    Culture   Final    NO GROWTH 2 DAYS Performed at [facility], [address] [place], [place] [address]    Report Status 08/09/2017 FINAL  Final  Aerobic/Anaerobic Culture (surgical/deep wound)     Status: None (Preliminary result)   Collection Time: 08/06/17  6:20 PM  Result Value Ref Range Status   Specimen Description FLUID PLEURAL LEFT  Final   Special Requests NONE  Final   Gram Stain   Final    ABUNDANT WBC PRESENT,  PREDOMINANTLY PMN NO ORGANISMS SEEN    Culture   Final    NO GROWTH 3 DAYS NO ANAEROBES ISOLATED; CULTURE IN PROGRESS FOR 5 DAYS Performed at [facility], [address] [place], [place] [address]    Report Status PENDING  Incomplete  Aerobic/Anaerobic Culture (surgical/deep wound)      Status: None (Preliminary result)   Collection Time: 08/06/17  6:29 PM  Result Value Ref Range Status   Specimen Description TISSUE LEFT PLEURAL  Final   Special Requests PLEURAL PEEL  Final   Gram Stain   Final    ABUNDANT WBC PRESENT, PREDOMINANTLY PMN NO ORGANISMS SEEN    Culture   Final    NO GROWTH 2 DAYS NO ANAEROBES ISOLATED; CULTURE IN PROGRESS FOR 5 DAYS Performed at [facility], [address], [place], [place] [address]    Report Status PENDING  Incomplete         Radiology Studies: Dg Chest Port 1 View  Result Date: 08/09/2017 CLINICAL DATA:  Left empyema.  Follow-up pneumothorax. EXAM: PORTABLE CHEST 1 VIEW COMPARISON:  08/08/2017 FINDINGS: Left chest tube remains in place. Small left basilar pneumothorax is stable in size. Mild left pleural thickening remains stable as well as atelectasis in the left lung base. Right lung is clear. Heart size is within normal limits. IMPRESSION: Stable small left basilar pneumothorax and atelectasis. Electronically Signed   By: Myles Rosenthal M.D.   On: 08/09/2017 10:14        Scheduled Meds: . acetaminophen  1,000 mg Oral Q6H   Or  . acetaminophen (TYLENOL) oral liquid 160 mg/5 mL  1,000 mg Oral Q6H  . bisacodyl  10 mg Oral Daily  . enoxaparin (LOVENOX) injection  40 mg Subcutaneous Q24H  . fentaNYL   Intravenous Q4H  . guaiFENesin  1,200 mg Oral BID  . ketoconazole   Topical BID  . levalbuterol  0.63 mg Nebulization BID  . polyethylene glycol  17 g Oral BID  . senna-docusate  2 tablet Oral BID  . sodium chloride flush  3 mL Intravenous Q12H   Continuous Infusions: . sodium chloride 10 mL/hr at 08/07/17 0840  . ampicillin-sulbactam (UNASYN) IV 3 g (08/10/17 0981)  . potassium chloride       LOS: 5 days    Time spent: 35 minutes    Kathlen Mody, MD [facility] Pager [phone]  If 7PM-7AM, please contact night-coverage [email or web address] Password [facility] 08/10/2017, 7:32 AM

## 2017-08-10 NOTE — Progress Notes (Addendum)
[address]       [place] [id number]             [phone]      4 Days Post-Op Procedure(s) (LRB): VIDEO BRONCHOSCOPY (N/A) VIDEO ASSISTED THORACOSCOPY (VATS)/EMPYEMA, MINI THORACOTOMY (Left) EMPYEMA DRAINAGE DECORTICATION Subjective: Feels pretty good, productive cough is improving , not significantly SOB Pain controlled with occ PCA use Objective: Vital signs in last 24 hours: Temp:  [97.7 F (36.5 C)-98.8 F (37.1 C)] 98.8 F (37.1 C) (06/10 0400) Pulse Rate:  [62-75] 75 (06/09 1924) Cardiac Rhythm: Normal sinus rhythm (06/10 0400) Resp:  [20-29] 23 (06/10 0417) BP: (114-142)/(70-92) 132/78 (06/10 0400) SpO2:  [93 %-100 %] 93 % (06/10 0417) FiO2 (%):  [0 %] 0 % (06/09 1600)  Hemodynamic parameters for last 24 hours:    Intake/Output from previous day: 06/09 0701 - 06/10 0700 In: 980 [P.O.:580; IV Piggyback:400] Out: 1395 [Urine:1345; Chest Tube:50] Intake/Output this shift: No intake/output data recorded.  General appearance: alert, cooperative and no distress Heart: regular rate and rhythm Lungs: mildly dim in left base Abdomen: benign Extremities: minor edema Wound: incis healing well  Lab Results: Recent Labs    08/09/17 0217 08/10/17 0234  WBC 9.1 9.7  HGB 16.4 16.4  HCT 48.1 47.9  PLT 339 353   BMET:  Recent Labs    08/09/17 0217 08/10/17 0234  NA 137 139  K 3.3* 4.0  CL 109 111  CO2 18* 19*  GLUCOSE 93 100*  BUN 8 5*  CREATININE 0.70 0.66  CALCIUM 8.1* 8.3*    PT/INR: No results for input(s): LABPROT, INR in the last 72 hours. ABG    Component Value Date/Time   PHART 7.383 08/07/2017 0336   HCO3 16.2 (L) 08/07/2017 0336   ACIDBASEDEF 7.9 (H) 08/07/2017 0336   O2SAT 97.7 08/07/2017 0336   CBG (last 3)  No results for input(s): GLUCAP in the last 72 hours.  Meds Scheduled Meds: . acetaminophen  1,000 mg Oral Q6H   Or  . acetaminophen (TYLENOL) oral liquid 160 mg/5 mL  1,000 mg Oral Q6H  . bisacodyl   10 mg Oral Daily  . enoxaparin (LOVENOX) injection  40 mg Subcutaneous Q24H  . fentaNYL   Intravenous Q4H  . guaiFENesin  1,200 mg Oral BID  . ketoconazole   Topical BID  . levalbuterol  0.63 mg Nebulization BID  . polyethylene glycol  17 g Oral BID  . senna-docusate  2 tablet Oral BID  . sodium chloride flush  3 mL Intravenous Q12H   Continuous Infusions: . sodium chloride 10 mL/hr at 08/07/17 0840  . ampicillin-sulbactam (UNASYN) IV 3 g (08/10/17 [id number])  . potassium chloride     PRN Meds:.sodium chloride, diphenhydrAMINE **OR** diphenhydrAMINE, HYDROcodone-acetaminophen, levalbuterol, naloxone **AND** sodium chloride flush, ondansetron (ZOFRAN) IV, oxyCODONE, potassium chloride, sodium chloride flush, traMADol  Xrays Dg Chest Port 1 View  Result Date: 08/09/2017 CLINICAL DATA:  Left empyema.  Follow-up pneumothorax. EXAM: PORTABLE CHEST 1 VIEW COMPARISON:  08/08/2017 FINDINGS: Left chest tube remains in place. Small left basilar pneumothorax is stable in size. Mild left pleural thickening remains stable as well as atelectasis in the left lung base. Right lung is clear. Heart size is within normal limits. IMPRESSION: Stable small left basilar pneumothorax and atelectasis. Electronically Signed   By: Myles RosenthalJohn  Stahl M.D.   On: 08/09/2017 10:14   Results for orders placed or performed during the hospital encounter of 08/04/17  Body fluid culture  Status: None   Collection Time: 08/04/17  2:11 PM  Result Value Ref Range Status   Specimen Description   Final    PLEURAL LEFT Performed at [facility], [address] [place], [place] [address]    Special Requests   Final    NONE Performed at [facility], [address] [place], [place] [address]    Gram Stain   Final    ABUNDANT WBC PRESENT, PREDOMINANTLY PMN NO ORGANISMS SEEN    Culture   Final    NO GROWTH 3 DAYS Performed at [facility], [address], [place], [place] [address]      Report Status 08/08/2017 FINAL  Final  Acid Fast Smear (AFB)     Status: None   Collection Time: 08/04/17  2:11 PM  Result Value Ref Range Status   AFB Specimen Processing Concentration  Final   Acid Fast Smear Negative  Final    Comment: (NOTE) Performed At: [facility] [address] [place], [place] [id number] Jolene Schimke MD OZ:[id number]    Source (AFB) PLEURAL  Final    Comment: LEFT Performed at [facility], [address], [place], [place] [address]   Blood culture (routine x 2)     Status: None   Collection Time: 08/04/17  2:36 PM  Result Value Ref Range Status   Specimen Description   Final    BLOOD LEFT ANTECUBITAL Performed at [facility], [address] [place], [place] [address]    Special Requests   Final    BOTTLES DRAWN AEROBIC AND ANAEROBIC Blood Culture adequate volume Performed at [facility], [address], [place], [place] [address]    Culture   Final    NO GROWTH 5 DAYS Performed at [facility], [address], [place], [place] [address]    Report Status 08/09/2017 FINAL  Final  Blood culture (routine x 2)     Status: None   Collection Time: 08/04/17  3:05 PM  Result Value Ref Range Status   Specimen Description   Final    BLOOD RIGHT ANTECUBITAL Performed at [facility], [address], [place], [place] [address]    Special Requests   Final    BOTTLES DRAWN AEROBIC AND ANAEROBIC Blood Culture adequate volume Performed at [facility], [address], [place], [place] [address]    Culture   Final    NO GROWTH 5 DAYS Performed at [facility], [address] [place], [place] [address]    Report Status 08/09/2017 FINAL  Final  MRSA PCR Screening     Status: None   Collection Time: 08/06/17  3:58 PM  Result Value Ref Range Status   MRSA by PCR NEGATIVE NEGATIVE Final    Comment:        The GeneXpert MRSA Assay  (FDA approved for NASAL specimens only), is one component of a comprehensive MRSA colonization surveillance program. It is not intended to diagnose MRSA infection nor to guide or monitor treatment for MRSA infections. Performed at [facility], [address], [place], [place] [address]   Anaerobic culture     Status: None (Preliminary result)   Collection Time: 08/06/17  5:29 PM  Result Value Ref Range Status   Specimen Description BRONCHIAL WASHINGS LEFT  Final   Special Requests   Final    NONE Performed at [facility], [address] [place], [place] [address]    Culture   Final    NO ANAEROBES ISOLATED; CULTURE IN PROGRESS FOR 5 DAYS   Report Status PENDING  Incomplete  Acid Fast Smear (AFB)     Status: None   Collection Time: 08/06/17  5:29 PM  Result Value Ref Range Status   AFB Specimen Processing Concentration  Final   Acid Fast Smear Negative  Final    Comment: (NOTE) Performed At: [facility] [address] [place], [place] [id number] Jolene Schimke MD XB:[id number]    Source (AFB) BRONCHIAL WASHINGS  Final    Comment: LEFT  Culture, respiratory (NON-Expectorated)     Status: None   Collection Time: 08/06/17  5:29 PM  Result Value Ref Range Status   Specimen Description BRONCHIAL WASHINGS LEFT  Final   Special Requests NONE  Final   Gram Stain   Final    RARE WBC PRESENT, PREDOMINANTLY PMN NO ORGANISMS SEEN    Culture   Final    NO GROWTH 2 DAYS Performed at [facility], [address], [place], [place] [address]    Report Status 08/09/2017 FINAL  Final  Aerobic/Anaerobic Culture (surgical/deep wound)     Status: None (Preliminary result)   Collection Time: 08/06/17  6:20 PM  Result Value Ref Range Status   Specimen Description FLUID PLEURAL LEFT  Final   Special Requests NONE  Final   Gram Stain   Final    ABUNDANT WBC PRESENT, PREDOMINANTLY PMN NO ORGANISMS SEEN    Culture   Final    NO GROWTH 3 DAYS NO ANAEROBES  ISOLATED; CULTURE IN PROGRESS FOR 5 DAYS Performed at [facility], [address], [place], [place] [address]    Report Status PENDING  Incomplete  Aerobic/Anaerobic Culture (surgical/deep wound)     Status: None (Preliminary result)   Collection Time: 08/06/17  6:29 PM  Result Value Ref Range Status   Specimen Description TISSUE LEFT PLEURAL  Final   Special Requests PLEURAL PEEL  Final   Gram Stain   Final    ABUNDANT WBC PRESENT, PREDOMINANTLY PMN NO ORGANISMS SEEN    Culture   Final    NO GROWTH 2 DAYS NO ANAEROBES ISOLATED; CULTURE IN PROGRESS FOR 5 DAYS Performed at [facility], [address], [place], [place] [address]    Report Status PENDING  Incomplete   Assessment/Plan: S/P Procedure(s) (LRB): VIDEO BRONCHOSCOPY (N/A) VIDEO ASSISTED THORACOSCOPY (VATS)/EMPYEMA, MINI THORACOTOMY (Left) EMPYEMA DRAINAGE DECORTICATION  1 some increase in PNTX, place CT back to suction, tiny air leak 2 cont current abx, medical management per primary 3 push rehab and pulm toilet as able   LOS: 5 days    Rowe Clack 08/10/2017  Placed suction back on chest tube with increased ptx,  Follow up chest xray in am I have seen and examined [name] and agree with the above assessment  and plan.  Delight Ovens MD Beeper [phone] Office [phone] 08/10/2017 11:02 AM

## 2017-08-10 NOTE — Op Note (Signed)
NAMEJavonn, [name] MEDICAL RECORD ZO:[id number] ACCOUNT [id number] DATE OF BIRTH:11-Dec-1960 FACILITY: [facility] LOCATION: [facility] PHYSICIAN:Deshane Cotroneo Bari Darrelyn Morro, MD  OPERATIVE REPORT  DATE OF PROCEDURE:  08/06/2017  PREOPERATIVE DIAGNOSIS:  Left empyema.  POSTOPERATIVE DIAGNOSIS:  Left empyema.  SURGICAL PROCEDURES:  Video bronchoscopy, left video-assisted thoracoscopy, mini thoracotomy, drainage of left empyema with decortication.  SURGEON:  Gwenith Daily. Tyrone Sage, MD  ASSISTANT:   Lowella Dandy, PA.  BRIEF HISTORY:  The patient is a 57 year old male who presented several days prior to surgery with increasing shortness of breath and left pleuritic pain.  Chest x-ray in the emergency room showed a large left pleural effusion.  Thoracentesis was done  which was incomplete, but did drain 1.8 liters of murky fluid.  The patient was initially seen by Dr. Cornelius Moras and was hesitant to proceed with VATS and further drainage of what appeared to be an empyema.  After further discussion with the patient, he was  agreeable with proceeding and after waiting an appropriate time after he ate breakfast, we proceeded with the surgical procedure and he agreed and signed informed consent.  DESCRIPTION OF PROCEDURE:  With central line and arterial line in place, the patient underwent general endotracheal anesthesia without incident.  A single lumen endotracheal tube was initially placed.  Appropriate time-out was performed.  We then  proceeded with video bronchoscopy to the subsegmental level of both the left and right tracheobronchial tree.  There were some secretions in the left lower lobe.  Bronchial washings for culture were obtained from the left lower lobe.  There were no  endobronchial lesions appreciated.  The scope was then removed.  A double lumen endotracheal tube was placed and the patient was turned into the lateral decubitus position with the left side up.  The left side had been preoperatively marked and  confirmed  on x-rays.  The left chest was prepped with Betadine and draped in the usual sterile manner.  A second timeout was performed.  We then proceeded first with approximately sixth intercostal space, mid axillary line a small incision and a port site was  placed into the left chest.  Approximately a liter to 1200 mL of purulent material was drained from the left chest.  There were numerous loculations present.  We then placed a second port site more and more anterior and enlarged the upper port to  approximately 1 cm and through this and with the assistance of the scope, we then completely drained the left chest and removed all of the loculations and fibrous peel that had been forming around the lung, during this, the first assistant, Denny Peon Barrett  provided and exposure and camera operation.  The left chest was copiously irrigated with the chest well drained and decorticated.  Appropriate cultures were obtained and specimen was sent.  A second site for a posterior chest tube was placed.  Through  the anterior tube was straight Argyle chest tube was placed.  Through the posterior site, a 28 Blake drain was placed.  The working incision was then closed with interrupted 0 Vicryl, running 2-0 Vicryl in subcutaneous tissue and a 3-0 subcuticular  stitch in the skin edges.  Dermabond was applied.  The lung reinflated.  Estimated blood loss approximately 200 mL.  Most of the fluid loss during the procedure was retained fluid in the chest.  Sponge and needle count was reported as correct  The first assistant helped with closure.  The patient tolerated the procedure without obvious complication.  He was extubated in  the operating room and transferred to the recovery room for further postoperative care.     AN/NUANCE  D:08/07/2017 T:08/07/2017 JOB:[id number]

## 2017-08-11 ENCOUNTER — Inpatient Hospital Stay (HOSPITAL_COMMUNITY): Payer: Self-pay

## 2017-08-11 LAB — AEROBIC/ANAEROBIC CULTURE W GRAM STAIN (SURGICAL/DEEP WOUND)

## 2017-08-11 LAB — AEROBIC/ANAEROBIC CULTURE (SURGICAL/DEEP WOUND): CULTURE: NO GROWTH

## 2017-08-11 NOTE — Progress Notes (Signed)
PROGRESS NOTE    Bryce Mendoza  ZOX:[id number] DOB: 21-Apr-1960 DOA: 08/04/2017 PCP: Patient, No Pcp Per    Brief Narrative:  57 year old male without significant past medical history, presents with dyspnea, work-up significant for left-sided pleural effusion, status post thoracentesis with 1.8 L drained, with residual loculated pleural effusion present, work-up significant for exudative pleural effusion/empyema, patient was seen by CT surgery, went for VATS/decortication procedure 08/06/2017.  Assessment & Plan:   Active Problems:   Pleural effusion on left   S/P thoracentesis   SOB (shortness of breath)   Hypokalemia  Large left pleural effusion / Empyema: -Pt is s/p US guided thoracentesis yielding 1.8L -Cytology neg for malignancy. Cultures have been negative so far.  -CT surgery consulted and he underwent bronchoscopy , left video assisted thoracoscopy, mini thoracotomy and drainage of the left empyema and decortication by DR Tyrone Sage on 08/06/17.  -Continued on chest tube per CTS -Remains on IV Unasyn and plan to transition to augmentin at time of discharge.  -Continue chest tube management per CTS  Hypokalemia:  Replaced, normalilzed  Hyponatremia: resolved and within normal limits  DVT prophylaxis: Lovenox Code Status: Full Family Communication: Pt in room, family not at bedside Disposition Plan: Uncertain at this time  Consultants:   CTS  Procedures:   Bronchoscopy , left video assisted thoracoscopy, mini thoracotomy and drainage of the left empyema and decortication by DR Tyrone Sage on 08/06/17    Antimicrobials: Anti-infectives (From admission, onward)   Start     Dose/Rate Route Frequency Ordered Stop   08/06/17 1300  ceFAZolin (ANCEF) IVPB 2g/100 mL premix     2 g 200 mL/hr over 30 Minutes Intravenous To Short Stay 08/06/17 1256 08/06/17 1716   08/06/17 1200  Ampicillin-Sulbactam (UNASYN) 3 g in sodium chloride 0.9 % 100 mL IVPB     3 g 200 mL/hr over 30  Minutes Intravenous Every 6 hours 08/06/17 1052     08/04/17 2000  cefTRIAXone (ROCEPHIN) 1 g in sodium chloride 0.9 % 100 mL IVPB  Status:  Discontinued     1 g 200 mL/hr over 30 Minutes Intravenous Every 24 hours 08/04/17 1819 08/04/17 1836   08/04/17 2000  azithromycin (ZITHROMAX) tablet 500 mg  Status:  Discontinued     500 mg Oral Daily 08/04/17 1819 08/06/17 1042   08/04/17 2000  cefTRIAXone (ROCEPHIN) 2 g in sodium chloride 0.9 % 100 mL IVPB  Status:  Discontinued     2 g 200 mL/hr over 30 Minutes Intravenous Every 24 hours 08/04/17 1836 08/06/17 1043       Subjective: No complaints. No sob  Objective: Vitals:   08/11/17 0440 08/11/17 0700 08/11/17 0744 08/11/17 0913  BP: (!) 147/95 (!) 132/93 (!) 132/93   Pulse:  61 (!) 57 75  Resp:  20 19 20   Temp: 98.6 F (37 C) 97.9 F (36.6 C)    TempSrc:  Oral    SpO2:  96% 96% 96%  Weight:      Height:        Intake/Output Summary (Last 24 hours) at 08/11/2017 1433 Last data filed at 08/11/2017 0800 Gross per 24 hour  Intake 1180 ml  Output 410 ml  Net 770 ml   Filed Weights   08/04/17 2056 08/06/17 2110 08/09/17 0527  Weight: 116.3 kg (256 lb 6.3 oz) 117 kg (257 lb 15 oz) 113.4 kg (249 lb 14.4 oz)    Examination:  General exam: Appears calm and comfortable  Respiratory system: Clear to auscultation. Respiratory  effort normal. Chest tube in place Cardiovascular system: S1 & S2 heard, RRR. Gastrointestinal system: Abdomen is nondistended, soft and nontender. No organomegaly or masses felt. Normal bowel sounds heard. Central nervous system: Alert and oriented. No focal neurological deficits. Extremities: Symmetric 5 x 5 power. Skin: No rashes, lesions  Psychiatry: Judgement and insight appear normal. Mood & affect appropriate.   Data Reviewed: I have personally reviewed following labs and imaging studies  CBC: Recent Labs  Lab 08/06/17 1312 08/07/17 0330 08/08/17 0754 08/09/17 0217 08/10/17 0234  WBC 9.5 13.5*  10.9* 9.1 9.7  HGB 18.1* 17.3* 16.3 16.4 16.4  HCT 51.9 49.2 48.1 48.1 47.9  MCV 101.0* 99.8 103.2* 102.1* 100.2*  PLT 308 344 347 339 353   Basic Metabolic Panel: Recent Labs  Lab 08/06/17 1312 08/07/17 0330 08/08/17 0754 08/09/17 0217 08/10/17 0234  NA 135 134* 135 137 139  K 4.0 4.3 3.7 3.3* 4.0  CL 108 108 107 109 111  CO2 14* 16* 19* 18* 19*  GLUCOSE 115* 161* 107* 93 100*  BUN 15 11 11 8  5*  CREATININE 0.79 0.79 0.80 0.70 0.66  CALCIUM 8.4* 8.1* 8.0* 8.1* 8.3*   GFR: Estimated Creatinine Clearance: 139.3 mL/min (by C-G formula based on SCr of 0.66 mg/dL). Liver Function Tests: Recent Labs  Lab 08/05/17 0741 08/06/17 1312 08/08/17 0754  AST 26 35 18  ALT 18 24 16*  ALKPHOS 93 89 73  BILITOT 1.4* 0.9 0.6  PROT 6.8 6.0* 5.6*  ALBUMIN 2.2* 2.1* 1.9*   No results for input(s): LIPASE, AMYLASE in the last 168 hours. No results for input(s): AMMONIA in the last 168 hours. Coagulation Profile: Recent Labs  Lab 08/05/17 0741 08/06/17 1312  INR 1.23 1.14   Cardiac Enzymes: No results for input(s): CKTOTAL, CKMB, CKMBINDEX, TROPONINI in the last 168 hours. BNP (last 3 results) No results for input(s): PROBNP in the last 8760 hours. HbA1C: No results for input(s): HGBA1C in the last 72 hours. CBG: No results for input(s): GLUCAP in the last 168 hours. Lipid Profile: No results for input(s): CHOL, HDL, LDLCALC, TRIG, CHOLHDL, LDLDIRECT in the last 72 hours. Thyroid Function Tests: No results for input(s): TSH, T4TOTAL, FREET4, T3FREE, THYROIDAB in the last 72 hours. Anemia Panel: No results for input(s): VITAMINB12, FOLATE, FERRITIN, TIBC, IRON, RETICCTPCT in the last 72 hours. Sepsis Labs: No results for input(s): PROCALCITON, LATICACIDVEN in the last 168 hours.  Recent Results (from the past 240 hour(s))  Body fluid culture     Status: None   Collection Time: 08/04/17  2:11 PM  Result Value Ref Range Status   Specimen Description   Final    PLEURAL  LEFT Performed at [facility], [address] [place], [place] [address]    Special Requests   Final    NONE Performed at [facility], [address] [place], [place] [address]    Gram Stain   Final    ABUNDANT WBC PRESENT, PREDOMINANTLY PMN NO ORGANISMS SEEN    Culture   Final    NO GROWTH 3 DAYS Performed at [facility], [address] [place], [place] [address]    Report Status 08/08/2017 FINAL  Final  Acid Fast Smear (AFB)     Status: None   Collection Time: 08/04/17  2:11 PM  Result Value Ref Range Status   AFB Specimen Processing Concentration  Final   Acid Fast Smear Negative  Final    Comment: (NOTE) Performed  At: [facility] [address] [place], [place] [id number] Jolene Schimke MD WU:[id number]    Source (AFB) PLEURAL  Final    Comment: LEFT Performed at [facility], [address] [place], [place] [address]   Blood culture (routine x 2)     Status: None   Collection Time: 08/04/17  2:36 PM  Result Value Ref Range Status   Specimen Description   Final    BLOOD LEFT ANTECUBITAL Performed at [facility], [address], [place], [place] [address]    Special Requests   Final    BOTTLES DRAWN AEROBIC AND ANAEROBIC Blood Culture adequate volume Performed at [facility], [address], [place], [place] [address]    Culture   Final    NO GROWTH 5 DAYS Performed at [facility], [address] [place], [place] [address]    Report Status 08/09/2017 FINAL  Final  Blood culture (routine x 2)     Status: None   Collection Time: 08/04/17  3:05 PM  Result Value Ref Range Status   Specimen Description   Final    BLOOD RIGHT ANTECUBITAL Performed at [facility], [address] [place], [place] [address]    Special Requests   Final    BOTTLES DRAWN AEROBIC AND ANAEROBIC Blood Culture adequate volume Performed at [facility], [address], [place], [place] [address]    Culture   Final    NO GROWTH 5 DAYS Performed at [facility], [address] [place], [place] [address]    Report Status 08/09/2017 FINAL  Final  MRSA PCR Screening     Status: None   Collection Time: 08/06/17  3:58 PM  Result Value Ref Range Status   MRSA by PCR NEGATIVE NEGATIVE Final    Comment:        The GeneXpert MRSA Assay (FDA approved for NASAL specimens only), is one component of a comprehensive MRSA colonization surveillance program. It is not intended to diagnose MRSA infection nor to guide or monitor treatment for MRSA infections. Performed at [facility], [address], [place], [place] [address]   Anaerobic culture     Status: None (Preliminary result)   Collection Time: 08/06/17  5:29 PM  Result Value Ref Range Status   Specimen Description BRONCHIAL WASHINGS LEFT  Final   Special Requests   Final    NONE Performed at [facility], [address], [place], [place] [address]    Culture   Final    NO ANAEROBES ISOLATED; CULTURE IN PROGRESS FOR 5 DAYS   Report Status PENDING  Incomplete  Acid Fast Smear (AFB)     Status: None   Collection Time: 08/06/17  5:29 PM  Result Value Ref Range Status   AFB Specimen Processing Concentration  Final   Acid Fast Smear Negative  Final    Comment: (NOTE) Performed At: [facility] [address] [place], [place] [id number] Jolene Schimke MD EP:[id number]    Source (AFB) BRONCHIAL WASHINGS  Final    Comment: LEFT  Culture, respiratory (NON-Expectorated)     Status: None   Collection Time: 08/06/17  5:29 PM  Result Value Ref Range Status   Specimen Description BRONCHIAL WASHINGS LEFT  Final   Special Requests NONE  Final   Gram Stain   Final    RARE WBC PRESENT, PREDOMINANTLY PMN NO ORGANISMS SEEN    Culture  Final    NO GROWTH 2 DAYS Performed at [facility], [address], [place], [place] [address]     Report Status 08/09/2017 FINAL  Final  Aerobic/Anaerobic Culture (surgical/deep wound)     Status: None (Preliminary result)   Collection Time: 08/06/17  6:20 PM  Result Value Ref Range Status   Specimen Description FLUID PLEURAL LEFT  Final   Special Requests NONE  Final   Gram Stain   Final    ABUNDANT WBC PRESENT, PREDOMINANTLY PMN NO ORGANISMS SEEN    Culture   Final    NO GROWTH 5 DAYS Performed at [facility], [address] [place], [place] [address]    Report Status PENDING  Incomplete  Aerobic/Anaerobic Culture (surgical/deep wound)     Status: None (Preliminary result)   Collection Time: 08/06/17  6:29 PM  Result Value Ref Range Status   Specimen Description TISSUE LEFT PLEURAL  Final   Special Requests PLEURAL PEEL  Final   Gram Stain   Final    ABUNDANT WBC PRESENT, PREDOMINANTLY PMN NO ORGANISMS SEEN    Culture   Final    NO GROWTH 4 DAYS NO ANAEROBES ISOLATED; CULTURE IN PROGRESS FOR 5 DAYS Performed at [facility], [address] [place], [place] [address]    Report Status PENDING  Incomplete     Radiology Studies: Dg Chest Port 1 View  Result Date: 08/11/2017 CLINICAL DATA:  57 year old male with history of left-sided chest tube. EXAM: PORTABLE CHEST 1 VIEW COMPARISON:  Chest x-ray 08/10/2017. FINDINGS: Previously noted left-sided chest tube remains stable in position with tip projecting over the lateral aspect of the mid left hemithorax. There continues to be some pleural fluid which appears to be loculated laterally, as well as a trace volume of residual pneumothorax most evident at the left base. Opacity at the left base partially obscuring the medial left hemidiaphragm may reflect atelectasis and/or consolidation. Right lung is clear. No right pleural effusion. No evidence of pulmonary edema. Heart size appears within normal limits. Upper mediastinal contours are normal. IMPRESSION: 1. Stable position of left-sided chest tube. Complex left  hydropneumothorax again noted with decreasing pneumothorax component and persistent laterally loculated fluid component. Electronically Signed   By: Trudie Reed M.D.   On: 08/11/2017 09:17   Dg Chest Port 1 View  Result Date: 08/10/2017 CLINICAL DATA:  Chest tube, evaluate effusion EXAM: PORTABLE CHEST 1 VIEW COMPARISON:  Portable chest x-ray of 08/09/2017 FINDINGS: A left chest tube remains and there is left hydropneumothorax present which may have slightly increased in volume in the interval. Mild bibasilar atelectasis remains and cardiomegaly is stable. There may be very mild pulmonary vascular congestion present. IMPRESSION: 1. Slight increase in size of left hydropneumothorax with left chest tube present. 2. Bibasilar opacities consistent with atelectasis remain. Question mild pulmonary vascular congestion. Electronically Signed   By: Dwyane Dee M.D.   On: 08/10/2017 08:59    Scheduled Meds: . acetaminophen  1,000 mg Oral Q6H   Or  . acetaminophen (TYLENOL) oral liquid 160 mg/5 mL  1,000 mg Oral Q6H  . bisacodyl  10 mg Oral Daily  . enoxaparin (LOVENOX) injection  40 mg Subcutaneous Q24H  . fentaNYL   Intravenous Q4H  . guaiFENesin  1,200 mg Oral BID  . ketoconazole   Topical BID  . levalbuterol  0.63 mg Nebulization BID  . senna-docusate  2 tablet Oral BID  . sodium chloride flush  3 mL Intravenous  Q12H   Continuous Infusions: . sodium chloride 10 mL/hr at 08/07/17 0840  . ampicillin-sulbactam (UNASYN) IV Stopped (08/11/17 0604)  . potassium chloride       LOS: 6 days   Rickey Barbara, MD [facility] Pager [phone]  If 7PM-7AM, please contact night-coverage [email or web address] Password [facility] 08/11/2017, 2:33 PM

## 2017-08-11 NOTE — Progress Notes (Addendum)
[address]       [place] [address]             [phone]      5 Days Post-Op Procedure(s) (LRB): VIDEO BRONCHOSCOPY (N/A) VIDEO ASSISTED THORACOSCOPY (VATS)/EMPYEMA, MINI THORACOTOMY (Left) EMPYEMA DRAINAGE DECORTICATION Subjective: Feels well, breathing is comfortable, no air leak today  Objective: Vital signs in last 24 hours: Temp:  [97.6 F (36.4 C)-99.1 F (37.3 C)] 97.9 F (36.6 C) (06/11 0700) Pulse Rate:  [61-75] 61 (06/11 0700) Cardiac Rhythm: Normal sinus rhythm (06/11 0415) Resp:  [18-27] 20 (06/11 0700) BP: (110-147)/(71-95) 132/93 (06/11 0700) SpO2:  [94 %-97 %] 96 % (06/11 0700) FiO2 (%):  [21 %] 21 % (06/10 1924)  Hemodynamic parameters for last 24 hours:    Intake/Output from previous day: 06/10 0701 - 06/11 0700 In: 1525 [P.O.:702; I.V.:723; IV Piggyback:100] Out: 535 [Urine:225; Chest Tube:310] Intake/Output this shift: No intake/output data recorded.  General appearance: alert, cooperative and no distress Heart: regular rate and rhythm Lungs: dim in bases Abdomen: benign Extremities: no edema Wound: incis healing well  Lab Results: Recent Labs    08/09/17 0217 08/10/17 0234  WBC 9.1 9.7  HGB 16.4 16.4  HCT 48.1 47.9  PLT 339 353   BMET:  Recent Labs    08/09/17 0217 08/10/17 0234  NA 137 139  K 3.3* 4.0  CL 109 111  CO2 18* 19*  GLUCOSE 93 100*  BUN 8 5*  CREATININE 0.70 0.66  CALCIUM 8.1* 8.3*    PT/INR: No results for input(s): LABPROT, INR in the last 72 hours. ABG    Component Value Date/Time   PHART 7.383 08/07/2017 0336   HCO3 16.2 (L) 08/07/2017 0336   ACIDBASEDEF 7.9 (H) 08/07/2017 0336   O2SAT 97.7 08/07/2017 0336   CBG (last 3)  No results for input(s): GLUCAP in the last 72 hours.  Meds Scheduled Meds: . acetaminophen  1,000 mg Oral Q6H   Or  . acetaminophen (TYLENOL) oral liquid 160 mg/5 mL  1,000 mg Oral Q6H  . bisacodyl  10 mg Oral Daily  . enoxaparin (LOVENOX)  injection  40 mg Subcutaneous Q24H  . fentaNYL   Intravenous Q4H  . guaiFENesin  1,200 mg Oral BID  . ketoconazole   Topical BID  . levalbuterol  0.63 mg Nebulization BID  . senna-docusate  2 tablet Oral BID  . sodium chloride flush  3 mL Intravenous Q12H   Continuous Infusions: . sodium chloride 10 mL/hr at 08/07/17 0840  . ampicillin-sulbactam (UNASYN) IV Stopped (08/11/17 0604)  . potassium chloride     PRN Meds:.sodium chloride, diphenhydrAMINE **OR** diphenhydrAMINE, HYDROcodone-acetaminophen, levalbuterol, naloxone **AND** sodium chloride flush, ondansetron (ZOFRAN) IV, oxyCODONE, potassium chloride, sodium chloride flush, traMADol  Xrays Dg Chest Port 1 View  Result Date: 08/10/2017 CLINICAL DATA:  Chest tube, evaluate effusion EXAM: PORTABLE CHEST 1 VIEW COMPARISON:  Portable chest x-ray of 08/09/2017 FINDINGS: A left chest tube remains and there is left hydropneumothorax present which may have slightly increased in volume in the interval. Mild bibasilar atelectasis remains and cardiomegaly is stable. There may be very mild pulmonary vascular congestion present. IMPRESSION: 1. Slight increase in size of left hydropneumothorax with left chest tube present. 2. Bibasilar opacities consistent with atelectasis remain. Question mild pulmonary vascular congestion. Electronically Signed   By: Dwyane Dee M.D.   On: 08/10/2017 08:59   Results for orders placed or performed during the hospital encounter of 08/04/17  Body  fluid culture     Status: None   Collection Time: 08/04/17  2:11 PM  Result Value Ref Range Status   Specimen Description   Final    PLEURAL LEFT Performed at [facility], [address], [place], [place] [address]    Special Requests   Final    NONE Performed at [facility], [address], [place], [place] [address]    Gram Stain   Final    ABUNDANT WBC PRESENT, PREDOMINANTLY PMN NO ORGANISMS SEEN    Culture   Final    NO  GROWTH 3 DAYS Performed at [facility], [address] [place], [place] [address]    Report Status 08/08/2017 FINAL  Final  Acid Fast Smear (AFB)     Status: None   Collection Time: 08/04/17  2:11 PM  Result Value Ref Range Status   AFB Specimen Processing Concentration  Final   Acid Fast Smear Negative  Final    Comment: (NOTE) Performed At: [facility] [address] [place], [place] [id number] Jolene Schimke MD OZ:[id number]    Source (AFB) PLEURAL  Final    Comment: LEFT Performed at [facility], [address], [place], [place] [address]   Blood culture (routine x 2)     Status: None   Collection Time: 08/04/17  2:36 PM  Result Value Ref Range Status   Specimen Description   Final    BLOOD LEFT ANTECUBITAL Performed at [facility], [address], [place], [place] [address]    Special Requests   Final    BOTTLES DRAWN AEROBIC AND ANAEROBIC Blood Culture adequate volume Performed at [facility], [address], [place], [place] [address]    Culture   Final    NO GROWTH 5 DAYS Performed at [facility], [address] [place], [place] [address]    Report Status 08/09/2017 FINAL  Final  Blood culture (routine x 2)     Status: None   Collection Time: 08/04/17  3:05 PM  Result Value Ref Range Status   Specimen Description   Final    BLOOD RIGHT ANTECUBITAL Performed at [facility], [address], [place], [place] [address]    Special Requests   Final    BOTTLES DRAWN AEROBIC AND ANAEROBIC Blood Culture adequate volume Performed at [facility], [address], [place], [place] [address]    Culture   Final    NO GROWTH 5 DAYS Performed at [facility], [address], [place], [place] [address]    Report Status 08/09/2017 FINAL  Final  MRSA PCR Screening     Status: None   Collection Time: 08/06/17  3:58 PM  Result Value Ref Range Status     MRSA by PCR NEGATIVE NEGATIVE Final    Comment:        The GeneXpert MRSA Assay (FDA approved for NASAL specimens only), is one component of a comprehensive MRSA colonization surveillance program. It is not intended to diagnose MRSA infection nor to guide or monitor treatment for MRSA infections. Performed at [facility], [address], [place], [place] [address]   Anaerobic culture     Status: None (Preliminary result)   Collection Time: 08/06/17  5:29 PM  Result Value Ref Range Status   Specimen Description BRONCHIAL WASHINGS LEFT  Final   Special Requests   Final    NONE Performed  at [facility], [address], [place], [place] [address]    Culture   Final    NO ANAEROBES ISOLATED; CULTURE IN PROGRESS FOR 5 DAYS   Report Status PENDING  Incomplete  Acid Fast Smear (AFB)     Status: None   Collection Time: 08/06/17  5:29 PM  Result Value Ref Range Status   AFB Specimen Processing Concentration  Final   Acid Fast Smear Negative  Final    Comment: (NOTE) Performed At: [facility] [address] [place], [place] [id number] Jolene Schimke MD XB:[id number]    Source (AFB) BRONCHIAL WASHINGS  Final    Comment: LEFT  Culture, respiratory (NON-Expectorated)     Status: None   Collection Time: 08/06/17  5:29 PM  Result Value Ref Range Status   Specimen Description BRONCHIAL WASHINGS LEFT  Final   Special Requests NONE  Final   Gram Stain   Final    RARE WBC PRESENT, PREDOMINANTLY PMN NO ORGANISMS SEEN    Culture   Final    NO GROWTH 2 DAYS Performed at [facility], [address] [place], [place] [address]    Report Status 08/09/2017 FINAL  Final  Aerobic/Anaerobic Culture (surgical/deep wound)     Status: None (Preliminary result)   Collection Time: 08/06/17  6:20 PM  Result Value Ref Range Status   Specimen Description FLUID PLEURAL LEFT  Final   Special Requests NONE  Final   Gram Stain   Final    ABUNDANT WBC PRESENT,  PREDOMINANTLY PMN NO ORGANISMS SEEN    Culture   Final    NO GROWTH 4 DAYS NO ANAEROBES ISOLATED; CULTURE IN PROGRESS FOR 5 DAYS Performed at [facility], [address], [place], [place] [address]    Report Status PENDING  Incomplete  Aerobic/Anaerobic Culture (surgical/deep wound)     Status: None (Preliminary result)   Collection Time: 08/06/17  6:29 PM  Result Value Ref Range Status   Specimen Description TISSUE LEFT PLEURAL  Final   Special Requests PLEURAL PEEL  Final   Gram Stain   Final    ABUNDANT WBC PRESENT, PREDOMINANTLY PMN NO ORGANISMS SEEN    Culture   Final    NO GROWTH 3 DAYS NO ANAEROBES ISOLATED; CULTURE IN PROGRESS FOR 5 DAYS Performed at [facility], [address], [place], [place] [address]    Report Status PENDING  Incomplete   Assessment/Plan: S/P Procedure(s) (LRB): VIDEO BRONCHOSCOPY (N/A) VIDEO ASSISTED THORACOSCOPY (VATS)/EMPYEMA, MINI THORACOTOMY (Left) EMPYEMA DRAINAGE DECORTICATION  1 CXR is pending- will check and make chest tube adjustments as able 2 no new labs today 3 medical service managing 4 routine rehab and pulm toilet  LOS: 6 days    Rowe Clack 08/11/2017  Dg Chest Port 1 View  Result Date: 08/11/2017 CLINICAL DATA:  57 year old male with history of left-sided chest tube. EXAM: PORTABLE CHEST 1 VIEW COMPARISON:  Chest x-ray 08/10/2017. FINDINGS: Previously noted left-sided chest tube remains stable in position with tip projecting over the lateral aspect of the mid left hemithorax. There continues to be some pleural fluid which appears to be loculated laterally, as well as a trace volume of residual pneumothorax most evident at the left base. Opacity at the left base partially obscuring the medial left hemidiaphragm may reflect atelectasis and/or consolidation. Right lung is clear. No right pleural effusion. No evidence of pulmonary edema. Heart size appears within normal limits. Upper mediastinal contours are normal.  IMPRESSION: 1. Stable position of left-sided chest tube. Complex left hydropneumothorax again noted with decreasing pneumothorax component and persistent laterally loculated fluid component. Electronically Signed   By: Trudie Reedaniel  Entrikin M.D.   On: 08/11/2017 09:17   I have seen and examined Bryce LingoEric Mendoza and agree with the above assessment  and plan.  Delight OvensEdward B Cassidee Deats MD [facility] [phone] Office [phone] 08/11/2017 5:38 PM

## 2017-08-12 ENCOUNTER — Inpatient Hospital Stay (HOSPITAL_COMMUNITY): Payer: Self-pay

## 2017-08-12 LAB — ANAEROBIC CULTURE

## 2017-08-12 NOTE — Progress Notes (Signed)
[facility] PROGRESS NOTE  Bryce Mendoza  EAV:[id number] DOB: [birth year] DOA: 08/04/2017 PCP: Patient, No Pcp Per  Brief Narrative: 57 year old male without significant past medical history, presents with dyspnea, work-up significant for left-sided pleural effusion, status post thoracentesis with 1.8 L drained, with residual loculated pleural effusion present, work-up significant for exudative pleural effusion/empyema, patient was seen by CT surgery, went for VATS/decortication 08/06/2017.  Subjective: Left chest pain radiating to abdomen this AM. Slightly worse from yesterday. Has been walking, eating ok. No N/V/D or urinary complaints. No fevers.  Objective: BP (!) 145/95 (BP Location: Left Arm)   Pulse 71   Temp 99.3 F (37.4 C) (Oral)   Resp (!) 21   Ht 6\' 1"  (1.854 m)   Wt 113.4 kg (249 lb 14.4 oz)   SpO2 96%   BMI 32.97 kg/m   Gen: No distress Pulm: Clear and nonlabored, decreased left side.   CV: RRR, no murmur, no JVD, no edema GI: Soft, NT, ND, +BS  Neuro: Alert and oriented. No focal deficits. Ext: Warm, no deformities Skin: Left chest tube site without significant erythema, discharge. Incision superior to this is intact with dermabond.  Assessment & Plan: Left exudative pleural effusion/empyema: s/p thoracentesis 1.8L with negative cultures and cytology. s/p bronchoscopy, left VATS, minithoracotomy and drainage, decortication 6/6 by Dr. Tyrone SageGerhardt.  - CT management per CT surgery, to water seal. CXR this AM stable hydropneumothorax. - Continue unasyn with plans to transition to augmentin at discharge x a few weeks. - On PCA, po norco, po tramadol for pain control per CT surgery.    Abdominal pain: Suspect referred from chest tube. Benign exam. Eating ok, not constipated.  - Monitor - Continue bowel regimen  Macrocytosis: Mild without anemia.  - consider work up if becoming anemic, showing signs of neuropathy.    Bryce Nineyan B Sherrise Liberto, MD [facility] [email or web address] Password [email or web address] 08/12/2017, 5:01 PM

## 2017-08-12 NOTE — Progress Notes (Addendum)
[address]       [place] [address]             [phone]      6 Days Post-Op Procedure(s) (LRB): VIDEO BRONCHOSCOPY (N/A) VIDEO ASSISTED THORACOSCOPY (VATS)/EMPYEMA, MINI THORACOTOMY (Left) EMPYEMA DRAINAGE DECORTICATION Subjective: Feels fair, not as well as yesterday, fairly nonspecific, not SOB  Objective: Vital signs in last 24 hours: Temp:  [98.7 F (37.1 C)-99.1 F (37.3 C)] 99 F (37.2 C) (06/12 0408) Pulse Rate:  [69-75] 71 (06/11 2009) Cardiac Rhythm: Normal sinus rhythm (06/12 0800) Resp:  [15-27] 18 (06/12 0407) BP: (125-140)/(79-84) 140/79 (06/12 0408) SpO2:  [96 %-97 %] 96 % (06/12 0407)  Hemodynamic parameters for last 24 hours:    Intake/Output from previous day: 06/11 0701 - 06/12 0700 In: 480 [P.O.:480] Out: 625 [Urine:575; Chest Tube:50] Intake/Output this shift: No intake/output data recorded.  General appearance: alert, cooperative and no distress Heart: regular rate and rhythm Lungs: sl coarse throughtout Abdomen: benign Extremities: no edema Wound: incis healing well  Lab Results: Recent Labs    08/10/17 0234  WBC 9.7  HGB 16.4  HCT 47.9  PLT 353   BMET:  Recent Labs    08/10/17 0234  NA 139  K 4.0  CL 111  CO2 19*  GLUCOSE 100*  BUN 5*  CREATININE 0.66  CALCIUM 8.3*    PT/INR: No results for input(s): LABPROT, INR in the last 72 hours. ABG    Component Value Date/Time   PHART 7.383 08/07/2017 0336   HCO3 16.2 (L) 08/07/2017 0336   ACIDBASEDEF 7.9 (H) 08/07/2017 0336   O2SAT 97.7 08/07/2017 0336   CBG (last 3)  No results for input(s): GLUCAP in the last 72 hours.  Meds Scheduled Meds: . bisacodyl  10 mg Oral Daily  . enoxaparin (LOVENOX) injection  40 mg Subcutaneous Q24H  . fentaNYL   Intravenous Q4H  . guaiFENesin  1,200 mg Oral BID  . ketoconazole   Topical BID  . senna-docusate  2 tablet Oral BID  . sodium chloride flush  3 mL Intravenous Q12H   Continuous Infusions: .  sodium chloride 10 mL/hr at 08/07/17 0840  . ampicillin-sulbactam (UNASYN) IV Stopped (08/12/17 0604)  . potassium chloride     PRN Meds:.sodium chloride, diphenhydrAMINE **OR** diphenhydrAMINE, HYDROcodone-acetaminophen, levalbuterol, naloxone **AND** sodium chloride flush, ondansetron (ZOFRAN) IV, oxyCODONE, potassium chloride, sodium chloride flush, traMADol  Xrays Dg Chest Port 1 View  Result Date: 08/11/2017 CLINICAL DATA:  57 year old male with history of left-sided chest tube. EXAM: PORTABLE CHEST 1 VIEW COMPARISON:  Chest x-ray 08/10/2017. FINDINGS: Previously noted left-sided chest tube remains stable in position with tip projecting over the lateral aspect of the mid left hemithorax. There continues to be some pleural fluid which appears to be loculated laterally, as well as a trace volume of residual pneumothorax most evident at the left base. Opacity at the left base partially obscuring the medial left hemidiaphragm may reflect atelectasis and/or consolidation. Right lung is clear. No right pleural effusion. No evidence of pulmonary edema. Heart size appears within normal limits. Upper mediastinal contours are normal. IMPRESSION: 1. Stable position of left-sided chest tube. Complex left hydropneumothorax again noted with decreasing pneumothorax component and persistent laterally loculated fluid component. Electronically Signed   By: Trudie Reed M.D.   On: 08/11/2017 09:17   Results for orders placed or performed during the hospital encounter of 08/04/17  Body fluid culture     Status: None  Collection Time: 08/04/17  2:11 PM  Result Value Ref Range Status   Specimen Description   Final    PLEURAL LEFT Performed at [facility], [address] [place], [place] [address]    Special Requests   Final    NONE Performed at [facility], [address], [place], [place] [address]    Gram Stain   Final    ABUNDANT WBC PRESENT, PREDOMINANTLY  PMN NO ORGANISMS SEEN    Culture   Final    NO GROWTH 3 DAYS Performed at [facility], [address], [place], [place] [address]    Report Status 08/08/2017 FINAL  Final  Acid Fast Smear (AFB)     Status: None   Collection Time: 08/04/17  2:11 PM  Result Value Ref Range Status   AFB Specimen Processing Concentration  Final   Acid Fast Smear Negative  Final    Comment: (NOTE) Performed At: [facility] [address] [place], [place] [id number] Jolene Schimke MD OZ:[id number]    Source (AFB) PLEURAL  Final    Comment: LEFT Performed at [facility], [address] [place], [place] [address]   Blood culture (routine x 2)     Status: None   Collection Time: 08/04/17  2:36 PM  Result Value Ref Range Status   Specimen Description   Final    BLOOD LEFT ANTECUBITAL Performed at [facility], [address], [place], [place] [address]    Special Requests   Final    BOTTLES DRAWN AEROBIC AND ANAEROBIC Blood Culture adequate volume Performed at [facility], [address], [place], [place] [address]    Culture   Final    NO GROWTH 5 DAYS Performed at [facility], [address], [place], [place] [address]    Report Status 08/09/2017 FINAL  Final  Blood culture (routine x 2)     Status: None   Collection Time: 08/04/17  3:05 PM  Result Value Ref Range Status   Specimen Description   Final    BLOOD RIGHT ANTECUBITAL Performed at [facility], [address] [place], [place] [address]    Special Requests   Final    BOTTLES DRAWN AEROBIC AND ANAEROBIC Blood Culture adequate volume Performed at [facility], [address] [place], [place] [address]    Culture   Final    NO GROWTH 5 DAYS Performed at [facility], [address] [place], [place] [address]    Report Status 08/09/2017 FINAL  Final  MRSA PCR Screening     Status: None   Collection Time:  08/06/17  3:58 PM  Result Value Ref Range Status   MRSA by PCR NEGATIVE NEGATIVE Final    Comment:        The GeneXpert MRSA Assay (FDA approved for NASAL specimens only), is one component of a comprehensive MRSA colonization surveillance program. It is not intended to diagnose MRSA infection nor to guide or monitor treatment for MRSA infections. Performed at [facility], [address], [place], [place] [address]   Anaerobic culture     Status: None (Preliminary result)   Collection Time: 08/06/17  5:29 PM  Result Value Ref Range Status   Specimen Description BRONCHIAL WASHINGS LEFT  Final   Special Requests   Final    NONE Performed at [facility], [address], [place], [place]  [id number]    Culture   Final    NO ANAEROBES ISOLATED; CULTURE IN PROGRESS FOR 5 DAYS   Report Status PENDING  Incomplete  Acid Fast Smear (AFB)     Status: None   Collection Time: 08/06/17  5:29 PM  Result Value Ref Range Status   AFB Specimen Processing Concentration  Final   Acid Fast Smear Negative  Final    Comment: (NOTE) Performed At: [facility] [address] [place], [place] [id number] Jolene SchimkeNagendra Sanjai MD XB:[id number]:[phone]    Source (AFB) BRONCHIAL WASHINGS  Final    Comment: LEFT  Culture, respiratory (NON-Expectorated)     Status: None   Collection Time: 08/06/17  5:29 PM  Result Value Ref Range Status   Specimen Description BRONCHIAL WASHINGS LEFT  Final   Special Requests NONE  Final   Gram Stain   Final    RARE WBC PRESENT, PREDOMINANTLY PMN NO ORGANISMS SEEN    Culture   Final    NO GROWTH 2 DAYS Performed at [facility], [address] [place], [place] [address]    Report Status 08/09/2017 FINAL  Final  Aerobic/Anaerobic Culture (surgical/deep wound)     Status: None   Collection Time: 08/06/17  6:20 PM  Result Value Ref Range Status   Specimen Description FLUID PLEURAL LEFT  Final   Special Requests NONE  Final   Gram Stain   Final     ABUNDANT WBC PRESENT, PREDOMINANTLY PMN NO ORGANISMS SEEN    Culture   Final    No growth aerobically or anaerobically. Performed at [facility], [address] [place], [place] [address]    Report Status 08/11/2017 FINAL  Final  Aerobic/Anaerobic Culture (surgical/deep wound)     Status: None (Preliminary result)   Collection Time: 08/06/17  6:29 PM  Result Value Ref Range Status   Specimen Description TISSUE LEFT PLEURAL  Final   Special Requests PLEURAL PEEL  Final   Gram Stain   Final    ABUNDANT WBC PRESENT, PREDOMINANTLY PMN NO ORGANISMS SEEN    Culture   Final    NO GROWTH 4 DAYS NO ANAEROBES ISOLATED; CULTURE IN PROGRESS FOR 5 DAYS Performed at [facility], [address] [place], [place] [address]    Report Status PENDING  Incomplete   Assessment/Plan: S/P Procedure(s) (LRB): VIDEO BRONCHOSCOPY (N/A) VIDEO ASSISTED THORACOSCOPY (VATS)/EMPYEMA, MINI THORACOTOMY (Left) EMPYEMA DRAINAGE DECORTICATION  1 chest xray stable in appearance but wasn't placed on H2O seal as ordered yesterday 2 cont to push rehab and pulm toilet 3 med management and ABX as per primary svc   LOS: 7 days    Rowe ClackWayne E Gold 08/12/2017  Patient seen yesterday , chart signed today Chest tube left yesterday I have seen and examined Bryce LingoEric Mendoza and agree with the above assessment  and plan.  Delight OvensEdward B Rachael Zapanta MD Beeper [phone] Office [phone] 08/13/2017 8:57 AM

## 2017-08-13 ENCOUNTER — Inpatient Hospital Stay (HOSPITAL_COMMUNITY): Payer: Self-pay

## 2017-08-13 ENCOUNTER — Inpatient Hospital Stay: Payer: Self-pay

## 2017-08-13 NOTE — Progress Notes (Addendum)
[address]       [place] [address]             [phone]      7 Days Post-Op Procedure(s) (LRB): VIDEO BRONCHOSCOPY (N/A) VIDEO ASSISTED THORACOSCOPY (VATS)/EMPYEMA, MINI THORACOTOMY (Left) EMPYEMA DRAINAGE DECORTICATION Subjective: C/O soreness, didn't sleep well but otherwise very stable  Objective: Vital signs in last 24 hours: Temp:  [97.8 F (36.6 C)-99.3 F (37.4 C)] 99 F (37.2 C) (06/13 0710) Pulse Rate:  [60-71] 60 (06/13 0710) Cardiac Rhythm: Normal sinus rhythm (06/13 0405) Resp:  [18-23] 19 (06/13 0710) BP: (119-156)/(77-96) 150/96 (06/13 0710) SpO2:  [94 %-96 %] 96 % (06/13 0356)  Hemodynamic parameters for last 24 hours:    Intake/Output from previous day: 06/12 0701 - 06/13 0700 In: 960 [P.O.:960] Out: 1850 [Urine:1850] Intake/Output this shift: Total I/O In: -  Out: 400 [Urine:400]  General appearance: alert, cooperative and no distress Heart: regular rate and rhythm Lungs: clear to auscultation bilaterally Abdomen: soft, non-tender; bowel sounds normal; no masses,  no organomegaly Extremities: no edema or calf tenderness Wound: incis healing well  Lab Results: No results for input(s): WBC, HGB, HCT, PLT in the last 72 hours. BMET: No results for input(s): NA, K, CL, CO2, GLUCOSE, BUN, CREATININE, CALCIUM in the last 72 hours.  PT/INR: No results for input(s): LABPROT, INR in the last 72 hours. ABG    Component Value Date/Time   PHART 7.383 08/07/2017 0336   HCO3 16.2 (L) 08/07/2017 0336   ACIDBASEDEF 7.9 (H) 08/07/2017 0336   O2SAT 97.7 08/07/2017 0336   CBG (last 3)  No results for input(s): GLUCAP in the last 72 hours.  Meds Scheduled Meds: . bisacodyl  10 mg Oral Daily  . enoxaparin (LOVENOX) injection  40 mg Subcutaneous Q24H  . fentaNYL   Intravenous Q4H  . guaiFENesin  1,200 mg Oral BID  . ketoconazole   Topical BID  . senna-docusate  2 tablet Oral BID  . sodium chloride flush  3 mL Intravenous  Q12H   Continuous Infusions: . sodium chloride 250 mL (08/12/17 1741)  . ampicillin-sulbactam (UNASYN) IV 3 g (08/13/17 9147)  . potassium chloride     PRN Meds:.sodium chloride, diphenhydrAMINE **OR** diphenhydrAMINE, HYDROcodone-acetaminophen, levalbuterol, naloxone **AND** sodium chloride flush, ondansetron (ZOFRAN) IV, oxyCODONE, potassium chloride, sodium chloride flush, traMADol  Xrays Dg Chest Port 1 View  Result Date: 08/13/2017 CLINICAL DATA:  Chest tube EXAM: PORTABLE CHEST 1 VIEW COMPARISON:  08/12/2017 FINDINGS: Left chest tube remains in place, unchanged. Loculated left lateral hydropneumothorax again noted, unchanged. Left basilar airspace opacity and minimal right base atelectasis, stable. Mild vascular congestion. IMPRESSION: No significant change since prior study. Electronically Signed   By: Charlett Nose M.D.   On: 08/13/2017 07:56   Dg Chest Port 1 View  Result Date: 08/12/2017 CLINICAL DATA:  Left pneumothorax.  Chest tube in place. EXAM: PORTABLE CHEST 1 VIEW COMPARISON:  08/11/2017, 08/10/2017 and 08/09/2017 FINDINGS: Left chest tube is unchanged. Lateral left base pneumothorax with adjacent pleural thickening and adjacent chronic consolidation in the left lower lobe are again noted. Heart size and vascularity are normal. Right lung is clear. No bone abnormality. IMPRESSION: No significant change in the appearance of the chest. Persistent loculated left lateral pneumothorax at the base with chronic pleural thickening and chronic consolidation in the left lower lobe. Electronically Signed   By: Francene Boyers M.D.   On: 08/12/2017 08:55    Results for orders placed or  performed during the hospital encounter of 08/04/17  Body fluid culture     Status: None   Collection Time: 08/04/17  2:11 PM  Result Value Ref Range Status   Specimen Description   Final    PLEURAL LEFT Performed at [facility], [address] [place], [place] [address]    Special  Requests   Final    NONE Performed at [facility], [address] [place], [place] [address]    Gram Stain   Final    ABUNDANT WBC PRESENT, PREDOMINANTLY PMN NO ORGANISMS SEEN    Culture   Final    NO GROWTH 3 DAYS Performed at [facility], [address] [place], [place] [address]    Report Status 08/08/2017 FINAL  Final  Acid Fast Smear (AFB)     Status: None   Collection Time: 08/04/17  2:11 PM  Result Value Ref Range Status   AFB Specimen Processing Concentration  Final   Acid Fast Smear Negative  Final    Comment: (NOTE) Performed At: [facility] [address] [place], [place] [id number] Jolene SchimkeNagendra Sanjai MD OZ:[id number]:[phone]    Source (AFB) PLEURAL  Final    Comment: LEFT Performed at [facility], [address] [place], [place] [address]   Blood culture (routine x 2)     Status: None   Collection Time: 08/04/17  2:36 PM  Result Value Ref Range Status   Specimen Description   Final    BLOOD LEFT ANTECUBITAL Performed at [facility], [address] [place], [place] [address]    Special Requests   Final    BOTTLES DRAWN AEROBIC AND ANAEROBIC Blood Culture adequate volume Performed at [facility], [address] [place], [place] [address]    Culture   Final    NO GROWTH 5 DAYS Performed at [facility], [address] [place], [place] [address]    Report Status 08/09/2017 FINAL  Final  Blood culture (routine x 2)     Status: None   Collection Time: 08/04/17  3:05 PM  Result Value Ref Range Status   Specimen Description   Final    BLOOD RIGHT ANTECUBITAL Performed at [facility], [address] [place], [place] [address]    Special Requests   Final    BOTTLES DRAWN AEROBIC AND ANAEROBIC Blood Culture adequate volume Performed at [facility], [address] [place], [place] [address]    Culture   Final    NO GROWTH 5  DAYS Performed at [facility], [address] [place], [place] [address]    Report Status 08/09/2017 FINAL  Final  MRSA PCR Screening     Status: None   Collection Time: 08/06/17  3:58 PM  Result Value Ref Range Status   MRSA by PCR NEGATIVE NEGATIVE Final    Comment:        The GeneXpert MRSA Assay (FDA approved for NASAL specimens only), is one component of a comprehensive MRSA colonization surveillance program. It is not intended to diagnose MRSA infection nor to guide or monitor treatment for MRSA infections. Performed at [facility], [address] [place], [place] [address]   Anaerobic culture     Status: None   Collection Time: 08/06/17  5:29 PM  Result Value Ref Range Status   Specimen Description BRONCHIAL WASHINGS LEFT  Final   Special Requests NONE  Final   Culture   Final    NO ANAEROBES ISOLATED Performed at [facility], [address], [place], [place] [address]    Report Status 08/12/2017 FINAL  Final  Acid Fast Smear (AFB)     Status: None   Collection Time: 08/06/17  5:29 PM  Result Value Ref Range Status   AFB Specimen Processing Concentration  Final   Acid Fast Smear Negative  Final    Comment: (NOTE) Performed At: [facility] [address] [place], [place] [address] Jolene Schimke MD XB:[id number]    Source (AFB) BRONCHIAL WASHINGS  Final    Comment: LEFT  Culture, respiratory (NON-Expectorated)     Status: None   Collection Time: 08/06/17  5:29 PM  Result Value Ref Range Status   Specimen Description BRONCHIAL WASHINGS LEFT  Final   Special Requests NONE  Final   Gram Stain   Final    RARE WBC PRESENT, PREDOMINANTLY PMN NO ORGANISMS SEEN    Culture   Final    NO GROWTH 2 DAYS Performed at [facility], [address], [place], [place] [address]    Report Status 08/09/2017 FINAL  Final  Aerobic/Anaerobic Culture (surgical/deep wound)     Status: None   Collection Time: 08/06/17  6:20 PM  Result Value  Ref Range Status   Specimen Description FLUID PLEURAL LEFT  Final   Special Requests NONE  Final   Gram Stain   Final    ABUNDANT WBC PRESENT, PREDOMINANTLY PMN NO ORGANISMS SEEN    Culture   Final    No growth aerobically or anaerobically. Performed at [facility], [address], [place], [place] [address]    Report Status 08/11/2017 FINAL  Final  Aerobic/Anaerobic Culture (surgical/deep wound)     Status: None (Preliminary result)   Collection Time: 08/06/17  6:29 PM  Result Value Ref Range Status   Specimen Description TISSUE LEFT PLEURAL  Final   Special Requests PLEURAL PEEL  Final   Gram Stain   Final    ABUNDANT WBC PRESENT, PREDOMINANTLY PMN NO ORGANISMS SEEN    Culture   Final    CULTURE REINCUBATED FOR BETTER GROWTH Performed at [facility], [address], [place], [place] [address]    Report Status PENDING  Incomplete     Assessment/Plan: S/P Procedure(s) (LRB): VIDEO BRONCHOSCOPY (N/A) VIDEO ASSISTED THORACOSCOPY (VATS)/EMPYEMA, MINI THORACOTOMY (Left) EMPYEMA DRAINAGE DECORTICATION  1 stable from surgical perspective, tmax 99.3. No air leak and CXR stable- will d/c tube today. 2 conts med management per primary    LOS: 8 days    Rowe Clack 08/13/2017  Chest tube out today  I have seen and examined Bryce Mendoza and agree with the above assessment  and plan.  Delight Ovens MD Beeper [phone] Office [phone] 08/13/2017 8:58 AM

## 2017-08-13 NOTE — Progress Notes (Signed)
[facility] PROGRESS NOTE  Bryce Mendoza  ZOX:[id number] DOB: 07/28/1960 DOA: 08/04/2017 PCP: Patient, No Pcp Per  Brief Narrative: 57 year old male without significant past medical history, presents with dyspnea, work-up significant for left-sided pleural effusion, status post thoracentesis with 1.8 L drained, with residual loculated pleural effusion present, work-up significant for exudative pleural effusion/empyema, patient was seen by CT surgery, went for VATS/decortication 08/06/2017.  Subjective: Pain better now that chest tube is out. No abd pain. EAting, ambulating. No fever.   Objective: BP 113/83 (BP Location: Left Arm)   Pulse 68   Temp 99 F (37.2 C) (Oral)   Resp 20   Ht 6\' 1"  (1.854 m)   Wt 113.4 kg (249 lb 14.4 oz)   SpO2 94%   BMI 32.97 kg/m   Gen: No distress Pulm: Clear and nonlabored, decreased left side.   CV: RRR, no murmur, no JVD, no edema GI: Soft, NT, ND, +BS. Remains nontender.  Neuro: Alert and oriented. No focal deficits. Ext: Warm, no deformities Skin: CT removed, site with mild erythema, no discharge or dehiscence. Incision superior to this is intact with dermabond.  Assessment & Plan: Left exudative pleural effusion/empyema: s/p thoracentesis 1.8L with negative cultures and cytology. s/p bronchoscopy, left VATS, minithoracotomy and drainage, decortication 6/6 by Dr. Tyrone Mendoza.  - CT management per CT surgery, DC'ed today. - Continue unasyn with plans to transition to augmentin at discharge.  - Off IV narcotics with controlled pain.   Abdominal pain: Suspect referred from chest tube. Benign exam. Eating ok, not constipated. Resolved.  - Continue bowel regimen  Macrocytosis: Mild without anemia.  - Consider work up if becoming anemic, showing signs of neuropathy.    Bryce Nineyan B [name], MD [facility] [email or web address] Password [email or web address] 08/13/2017, 1:23 PM

## 2017-08-13 NOTE — Plan of Care (Signed)
Continue current care plan 

## 2017-08-14 ENCOUNTER — Inpatient Hospital Stay (HOSPITAL_COMMUNITY): Payer: Self-pay

## 2017-08-14 LAB — CBC
HCT: 45.1 % (ref 39.0–52.0)
Hemoglobin: 15.3 g/dL (ref 13.0–17.0)
MCH: 33.9 pg (ref 26.0–34.0)
MCHC: 33.9 g/dL (ref 30.0–36.0)
MCV: 100 fL (ref 78.0–100.0)
Platelets: 323 10*3/uL (ref 150–400)
RBC: 4.51 MIL/uL (ref 4.22–5.81)
RDW: 13.2 % (ref 11.5–15.5)
WBC: 9.4 10*3/uL (ref 4.0–10.5)

## 2017-08-14 LAB — BASIC METABOLIC PANEL
Anion gap: 7 (ref 5–15)
BUN: 7 mg/dL (ref 6–20)
CO2: 23 mmol/L (ref 22–32)
Calcium: 8.3 mg/dL — ABNORMAL LOW (ref 8.9–10.3)
Chloride: 107 mmol/L (ref 101–111)
Creatinine, Ser: 0.85 mg/dL (ref 0.61–1.24)
GFR calc Af Amer: >60 mL/min (ref >60)
GFR calc non Af Amer: >60 mL/min (ref >60)
Glucose, Bld: 101 mg/dL — ABNORMAL HIGH (ref 65–99)
Potassium: 3.7 mmol/L (ref 3.5–5.1)
Sodium: 137 mmol/L (ref 135–145)

## 2017-08-14 NOTE — Progress Notes (Addendum)
[address]       [place] [id number]             [phone]      8 Days Post-Op Procedure(s) (LRB): VIDEO BRONCHOSCOPY (N/A) VIDEO ASSISTED THORACOSCOPY (VATS)/EMPYEMA, MINI THORACOTOMY (Left) EMPYEMA DRAINAGE DECORTICATION Subjective: Feels ok  Objective: Vital signs in last 24 hours: Temp:  [98.6 F (37 C)-99.5 F (37.5 C)] 99.1 F (37.3 C) (06/14 0800) Pulse Rate:  [59-78] 59 (06/14 0309) Cardiac Rhythm: Normal sinus rhythm (06/14 0702) Resp:  [17-25] 25 (06/14 0800) BP: (113-153)/(79-94) 153/94 (06/14 0800) SpO2:  [92 %-98 %] 96 % (06/14 0309)  Hemodynamic parameters for last 24 hours:    Intake/Output from previous day: 06/13 0701 - 06/14 0700 In: 2312.7 [P.O.:1044; I.V.:748.7; IV Piggyback:520] Out: 670 [Urine:650; Chest Tube:20] Intake/Output this shift: No intake/output data recorded.  General appearance: alert, cooperative and no distress Heart: regular rate and rhythm Lungs: clear to auscultation bilaterally Abdomen: benign Extremities: no edema or calf tenderness Wound: incis healing well  Lab Results: Recent Labs    08/14/17 0322  WBC 9.4  HGB 15.3  HCT 45.1  PLT 323   BMET:  Recent Labs    08/14/17 0322  NA 137  K 3.7  CL 107  CO2 23  GLUCOSE 101*  BUN 7  CREATININE 0.85  CALCIUM 8.3*    PT/INR: No results for input(s): LABPROT, INR in the last 72 hours. ABG    Component Value Date/Time   PHART 7.383 08/07/2017 0336   HCO3 16.2 (L) 08/07/2017 0336   ACIDBASEDEF 7.9 (H) 08/07/2017 0336   O2SAT 97.7 08/07/2017 0336   CBG (last 3)  No results for input(s): GLUCAP in the last 72 hours.  Meds Scheduled Meds: . bisacodyl  10 mg Oral Daily  . enoxaparin (LOVENOX) injection  40 mg Subcutaneous Q24H  . guaiFENesin  1,200 mg Oral BID  . ketoconazole   Topical BID  . senna-docusate  2 tablet Oral BID  . sodium chloride flush  3 mL Intravenous Q12H   Continuous Infusions: . sodium chloride 250 mL  (08/12/17 1741)  . ampicillin-sulbactam (UNASYN) IV Stopped (08/14/17 0801)  . potassium chloride     PRN Meds:.sodium chloride, HYDROcodone-acetaminophen, levalbuterol, oxyCODONE, potassium chloride, sodium chloride flush, traMADol  Xrays Dg Chest 2 View  Result Date: 08/14/2017 CLINICAL DATA:  Postoperative evaluation.  Prior chest tube removal. EXAM: CHEST - 2 VIEW COMPARISON:  None. FINDINGS: Loculated left base hydropneumothorax again noted, this appears slightly smaller on today's exam. Atelectatic changes left lung base. Heart size stable. No acute bony abnormality. IMPRESSION: Left base hydropneumothorax again noted, this appears slightly smaller on today's exam. Atelectatic changes left base again noted. Electronically Signed   By: Maisie Fushomas  Register   On: 08/14/2017 07:36   Dg Chest Port 1 View    Assessment/Plan: S/P Procedure(s) (LRB): VIDEO BRONCHOSCOPY (N/A) VIDEO ASSISTED THORACOSCOPY (VATS)/EMPYEMA, MINI THORACOTOMY (Left) EMPYEMA DRAINAGE DECORTICATION  1 stable from surgical perspective, now CT out with slowly improving appearance of CXR. No fevers, hemodyn stable 2 will arrange F/U appt with surgeon 3 discharge per primary team when medically optimized . Plans for Augmentin at d/c noted   LOS: 9 days    Rowe ClackWayne E Gold 08/14/2017  Chest xray reviewed decreasing left lower ptx/space Home tomorrow on po antibiotics Patient has no one to assist in home care today , sister coming tomorrow who can stay with him and pick him up I have seen  and examined [name] and agree with the above assessment  and plan.  Delight Ovens MD Beeper [phone] Office [phone] 08/14/2017 9:20 AM

## 2017-08-14 NOTE — Care Management (Addendum)
CM provided MATCH letter to pt and explained program.  Attending informed CM that pt will discharge home on Augmentin and PO Pain medications.  CM requested Dr Jarvis NewcomerGrunz to place CM consult on discharge day if discharge medications are not as mentioned above.   CM left handoff for weekend Case Manager

## 2017-08-14 NOTE — Plan of Care (Signed)
Plan for patient to discharge home tomorrow. Most of education completed

## 2017-08-14 NOTE — Progress Notes (Signed)
[facility] PROGRESS NOTE  Bryce Mendoza Mendoza  HQI:[id number]:[id number] DOB: 02/18/1961 DOA: 08/04/2017 PCP: Patient, No Pcp Per  Brief Narrative: 57 year old male without significant past medical history, presents with dyspnea, work-up significant for left-sided pleural effusion, status post thoracentesis with 1.8 L drained, with residual loculated pleural effusion present, work-up significant for exudative pleural effusion/empyema, patient was seen by CT surgery, went for VATS/decortication 08/06/2017.  Subjective: Just walked with minimal dyspnea and left lateral chest pain. No IV narcotics required. No fever.   Objective: BP (!) 153/94 (BP Location: Left Arm)   Pulse (!) 59   Temp 99.1 F (37.3 C) (Oral)   Resp (!) 25   Ht 6\' 1"  (1.854 m)   Wt 113.4 kg (249 lb 14.4 oz)   SpO2 97%   BMI 32.97 kg/m   Gen: No distress Pulm: Clear and nonlabored, decreased left side.   CV: RRR, no murmur, no JVD, no edema GI: Soft, NT, ND, +BS. Remains nontender.  Neuro: Alert and oriented. No focal deficits. Ext: Warm, no deformities Skin: Left lateral chest tube site with mild erythema, no discharge or dehiscence. Incision superior to this is clean without erythema and intact with dermabond.  Assessment & Plan: Left exudative pleural effusion/empyema: s/p thoracentesis 1.8L with negative cultures and cytology. s/p bronchoscopy, left VATS, minithoracotomy and drainage, decortication 6/6 by Dr. Tyrone SageGerhardt.  - CT management per CT surgery, DC'ed 6/13. CXR with slimmer PTX on left with atelectasis.  - Continue unasyn with plans to transition to augmentin at discharge. Abx 6/6 - 6/19. Leukocytosis has been resolved since 6/8.  - Off IV narcotics with controlled pain. Should be stable for discharge tomorrow per CT surgery.  Abdominal pain: Suspect referred from chest tube. Benign exam. Eating ok, not constipated. Resolved.  - Continue bowel regimen  Macrocytosis: Mild without anemia. Resolved.  - Consider work up  if becoming anemic, showing signs of neuropathy.    Bryce Mendoza Bryce Mendoza B Isanti Antolin, MD [facility] [email or web address] Password [email or web address] 08/14/2017, 10:14 AM

## 2017-08-15 DIAGNOSIS — R0602 Shortness of breath: Secondary | ICD-10-CM

## 2017-08-15 DIAGNOSIS — J90 Pleural effusion, not elsewhere classified: Secondary | ICD-10-CM

## 2017-08-15 DIAGNOSIS — Z9889 Other specified postprocedural states: Secondary | ICD-10-CM

## 2017-08-15 MED ORDER — TRAMADOL HCL 50 MG PO TABS: 100.0000 mg | ORAL_TABLET | Freq: Four times a day (QID) | ORAL | 0 refills | Status: DC | PRN

## 2017-08-15 MED ORDER — AMOXICILLIN-POT CLAVULANATE 875-125 MG PO TABS: 1.0000 | ORAL_TABLET | Freq: Two times a day (BID) | ORAL | 0 refills | Status: DC

## 2017-08-15 NOTE — Discharge Summary (Addendum)
Physician Discharge Summary  Bryce Mendoza ZOX:[id number] DOB: February 12, 1961 DOA: 08/04/2017  PCP: Patient, No Pcp Per  Admit date: 08/04/2017 Discharge date: 08/15/2017  Admitted From: Home Disposition: Home   Recommendations for Outpatient Follow-up:  1. Follow up with PCP in 1-2 weeks 2. Follow up with cardiothoracic surgery for wound recheck, suture removal  Home Health: None Equipment/Devices: None Discharge Condition: Stable CODE STATUS: Full Diet recommendation: As tolerated  Brief/Interim Summary: 57 year old male without significant past medical history, presents with dyspnea, work-up significant for left-sided pleural effusion, status post thoracentesis with 1.8 L drained, with residual loculated pleural effusion present, work-up significant for exudative pleural effusion/empyema, patient was seen by CT surgery, went for VATS/decortication 08/06/2017. Chest tubes were removed with continued improvement in CXR and symptoms. He has been afebrile since 6/4 and without leukocytosis since 6/6.   Discharge Diagnoses:  Active Problems:   Pleural effusion on left   S/P thoracentesis   SOB (shortness of breath)   Hypokalemia  Left exudative pleural effusion/empyema, aspiration pneumonia: s/p thoracentesis 1.8L with negative cultures and cytology. s/p bronchoscopy, left VATS, minithoracotomy and drainage, decortication 6/6 by Dr. Tyrone Sage.  - CT management per CT surgery, DC'ed 6/13. CXR with slimmer PTX on left with atelectasis. Will follow up for suture removal 6/19 and with CT surgery 7/1.  - Transition unasyn to augmentin at discharge. Abx 6/6 - 6/19. Leukocytosis has been resolved since 6/6.  Abdominal pain: Suspect referred from chest tube. Benign exam. Eating ok, not constipated. Resolved. Recommended probiotics if develops diarrhea and gave return precautions for symptoms of CDiff.   Macrocytosis: Mild without anemia. Resolved.  - Consider work up if becoming anemic, or if showing  signs of neuropathy.  Discharge Instructions Discharge Instructions    Discharge instructions   Complete by:  As directed    Take augmentin twice daily for 1 more week. You may take ibuprofen 600mg  up to every 6 hours and tylenol 650mg -1000mg  up to every 6 hours as well for pain. If pain remains uncontrolled, take tramadol as needed (prescription printed for you).   Follow up as directed.   If you experience worsening cough, shortness of breath, fever, chest pain, abdominal pain, nausea, vomiting, or diarrhea seek medical attention.   Increase activity slowly   Complete by:  As directed      Allergies as of 08/15/2017   No Known Allergies     Medication List    TAKE these medications   amoxicillin-clavulanate 875-125 MG tablet Commonly known as:  AUGMENTIN Take 1 tablet by mouth every 12 (twelve) hours.   traMADol 50 MG tablet Commonly known as:  ULTRAM Take 2 tablets (100 mg total) by mouth every 6 (six) hours as needed for severe pain (pain).      Follow-up Information    Delight Ovens, MD. Go on 08/31/2017.   Specialty:  Cardiothoracic Surgery Why:  PA/LAT CXR to be taken (at [facility] which is in the same building as Dr. Dennie Maizes office) on 08/31/2017 at 1:00 pm;Appointment time is at 1:30 pm Contact information: [address] [place] [address] [phone]        Nurse. Go on 08/19/2017.   Why:  Appointment is with nurse for chest tube suture removal only. Appointment time is at 10:00 am Contact information: [address] [place] [address]         No Known Allergies  Consultations:  Cardiothoracic surgery  Procedures/Studies: Dg Chest 1 View  Result  Date: 08/04/2017 CLINICAL DATA:  Status post LEFT thoracentesis. EXAM: CHEST  1 VIEW COMPARISON:  08/04/2017 radiographs FINDINGS: LEFT pleural effusion has decreased in size, now moderate to large. There is no evidence of pneumothorax. RIGHT lung is clear.  The cardiomediastinal silhouette is unchanged. IMPRESSION: Decreased LEFT pleural effusion, now moderate to large. No pneumothorax. Electronically Signed   By: Harmon Pier M.D.   On: 08/04/2017 14:39   Dg Chest 2 View  Result Date: 08/14/2017 CLINICAL DATA:  Postoperative evaluation.  Prior chest tube removal. EXAM: CHEST - 2 VIEW COMPARISON:  None. FINDINGS: Loculated left base hydropneumothorax again noted, this appears slightly smaller on today's exam. Atelectatic changes left lung base. Heart size stable. No acute bony abnormality. IMPRESSION: Left base hydropneumothorax again noted, this appears slightly smaller on today's exam. Atelectatic changes left base again noted. Electronically Signed   By: Maisie Fus  Register   On: 08/14/2017 07:36   Dg Chest 2 View  Result Date: 08/06/2017 CLINICAL DATA:  Pleural effusion. EXAM: CHEST - 2 VIEW COMPARISON:  August 04, 2017 FINDINGS: The left-sided pleural effusion with underlying opacity is stable. No pneumothorax. The cardiomediastinal silhouette is unchanged. The right lung is clear. No other abnormalities. IMPRESSION: Stable left effusion. Electronically Signed   By: Gerome Sam III M.D   On: 08/06/2017 09:37   Dg Chest 2 View  Result Date: 08/04/2017 CLINICAL DATA:  Chest pain, shortness of breath. EXAM: CHEST - 2 VIEW COMPARISON:  None. FINDINGS: The heart size and mediastinal contours are within normal limits. Right lung is clear. No pneumothorax is noted. Interval development of large left pleural effusion is noted with probable underlying atelectasis or infiltrate. The visualized skeletal structures are unremarkable. IMPRESSION: Interval development of large left pleural effusion with probable underlying atelectasis or infiltrate. Electronically Signed   By: Lupita Raider, M.D.   On: 08/04/2017 12:50   Ct Chest W Contrast  Result Date: 08/04/2017 CLINICAL DATA:  Dyspnea with left-sided chest pain on 11 days ago. Thoracentesis earlier today. EXAM:  CT CHEST WITH CONTRAST TECHNIQUE: Multidetector CT imaging of the chest was performed during intravenous contrast administration. CONTRAST:  75mL ISOVUE-300 IOPAMIDOL (ISOVUE-300) INJECTION 61% COMPARISON:  Chest radiographs earlier today FINDINGS: Cardiovascular: Suboptimal contrast bolus. No acute vascular findings are seen. There is mild coronary artery atherosclerosis. The heart size is normal. There is no pericardial effusion. Mediastinum/Nodes: There are no enlarged mediastinal, hilar or axillary lymph nodes.There are scattered small mediastinal lymph nodes. There are also prominent left internal mammary lymph nodes, measuring to 10 mm short axis on image 55/2. The thyroid gland, trachea and esophagus demonstrate no significant findings. Lungs/Pleura: There is a moderate size residual left pleural effusion which is loculated laterally and with associated pleural thickening. No pleural based mass identified on noncontrast imaging. There is no pneumothorax or right-sided pleural effusion. There is compressive left lung atelectasis, especially in the lower lobe. The right lung is well aerated with patchy ground-glass opacities, especially in the upper lobe. No pulmonary nodule or endobronchial lesion seen. Upper abdomen: Borderline hepatic steatosis without apparent focal hepatic abnormality. The adrenal glands appear normal. Portacaval node measuring 17 mm short axis on image 165/2. Musculoskeletal/Chest wall: There is no chest wall mass or suspicious osseous finding. IMPRESSION: 1. Moderate size residual loculated left pleural effusion is suboptimally characterized without contrast, although is associated with some pleural thickening, suggesting a possible exudate. Correlate with thoracentesis results. 2. Resulting compressive left lung atelectasis, especially in the lower lobe. No endobronchial lesion or  suspicious nodule within the aerated lungs. 3. Patchy right lung ground-glass opacities (mosaic  attenuation), likely related to small airways disease or edema. No evidence of pulmonary embolism on nondedicated imaging. 4. Enlarged left internal mammary and portacaval lymph nodes are nonspecific and possibly reactive. No mediastinal adenopathy. Electronically Signed   By: Carey Bullocks M.D.   On: 08/04/2017 16:35   Dg Chest Port 1 View  Result Date: 08/13/2017 CLINICAL DATA:  LEFT chest tube removal EXAM: PORTABLE CHEST 1 VIEW COMPARISON:  Portable exam 0851 hours compared to 0721 hours FINDINGS: Interval removal of LEFT thoracostomy tube. Loculated hydropneumothorax at LEFT lung base, pneumothorax component appears slightly larger but this could be due to slight rotation to the RIGHT. Significant atelectasis of the lower LEFT lung again identified. RIGHT lung remains clear. IMPRESSION: Persistent loculated LEFT hydropneumothorax, with pneumothorax component appearing slightly larger post chest tube removal though this could be related to patient rotation. Electronically Signed   By: Ulyses Southward M.D.   On: 08/13/2017 09:06   Dg Chest Port 1 View  Result Date: 08/13/2017 CLINICAL DATA:  Chest tube EXAM: PORTABLE CHEST 1 VIEW COMPARISON:  08/12/2017 FINDINGS: Left chest tube remains in place, unchanged. Loculated left lateral hydropneumothorax again noted, unchanged. Left basilar airspace opacity and minimal right base atelectasis, stable. Mild vascular congestion. IMPRESSION: No significant change since prior study. Electronically Signed   By: Charlett Nose M.D.   On: 08/13/2017 07:56   Dg Chest Port 1 View  Result Date: 08/12/2017 CLINICAL DATA:  Left pneumothorax.  Chest tube in place. EXAM: PORTABLE CHEST 1 VIEW COMPARISON:  08/11/2017, 08/10/2017 and 08/09/2017 FINDINGS: Left chest tube is unchanged. Lateral left base pneumothorax with adjacent pleural thickening and adjacent chronic consolidation in the left lower lobe are again noted. Heart size and vascularity are normal. Right lung is clear.  No bone abnormality. IMPRESSION: No significant change in the appearance of the chest. Persistent loculated left lateral pneumothorax at the base with chronic pleural thickening and chronic consolidation in the left lower lobe. Electronically Signed   By: Francene Boyers M.D.   On: 08/12/2017 08:55   Dg Chest Port 1 View  Result Date: 08/11/2017 CLINICAL DATA:  57 year old male with history of left-sided chest tube. EXAM: PORTABLE CHEST 1 VIEW COMPARISON:  Chest x-ray 08/10/2017. FINDINGS: Previously noted left-sided chest tube remains stable in position with tip projecting over the lateral aspect of the mid left hemithorax. There continues to be some pleural fluid which appears to be loculated laterally, as well as a trace volume of residual pneumothorax most evident at the left base. Opacity at the left base partially obscuring the medial left hemidiaphragm may reflect atelectasis and/or consolidation. Right lung is clear. No right pleural effusion. No evidence of pulmonary edema. Heart size appears within normal limits. Upper mediastinal contours are normal. IMPRESSION: 1. Stable position of left-sided chest tube. Complex left hydropneumothorax again noted with decreasing pneumothorax component and persistent laterally loculated fluid component. Electronically Signed   By: Trudie Reed M.D.   On: 08/11/2017 09:17   Dg Chest Port 1 View  Result Date: 08/10/2017 CLINICAL DATA:  Chest tube, evaluate effusion EXAM: PORTABLE CHEST 1 VIEW COMPARISON:  Portable chest x-ray of 08/09/2017 FINDINGS: A left chest tube remains and there is left hydropneumothorax present which may have slightly increased in volume in the interval. Mild bibasilar atelectasis remains and cardiomegaly is stable. There may be very mild pulmonary vascular congestion present. IMPRESSION: 1. Slight increase in size of left hydropneumothorax  with left chest tube present. 2. Bibasilar opacities consistent with atelectasis remain. Question  mild pulmonary vascular congestion. Electronically Signed   By: Dwyane Dee M.D.   On: 08/10/2017 08:59   Dg Chest Port 1 View  Result Date: 08/09/2017 CLINICAL DATA:  Left empyema.  Follow-up pneumothorax. EXAM: PORTABLE CHEST 1 VIEW COMPARISON:  08/08/2017 FINDINGS: Left chest tube remains in place. Small left basilar pneumothorax is stable in size. Mild left pleural thickening remains stable as well as atelectasis in the left lung base. Right lung is clear. Heart size is within normal limits. IMPRESSION: Stable small left basilar pneumothorax and atelectasis. Electronically Signed   By: Myles Rosenthal M.D.   On: 08/09/2017 10:14   Dg Chest Port 1 View  Result Date: 08/08/2017 CLINICAL DATA:  Chest tubes on the left.  Pleural effusion. EXAM: PORTABLE CHEST 1 VIEW COMPARISON:  August 07, 2017 FINDINGS: There are 2 chest tubes on the left. No pneumothorax. There is loculated pleural effusion on the left, stable. There is patchy consolidation in the left base. Right lung is clear. Heart is mildly enlarged with pulmonary vascularity normal. No adenopathy. No bone lesions. IMPRESSION: Chest tubes present on the left without pneumothorax. Loculated pleural effusion on the left remains with consolidation left base. Right lung clear. No new opacity. Stable cardiac enlargement. Electronically Signed   By: Bretta Bang III M.D.   On: 08/08/2017 07:56   Dg Chest Port 1 View  Result Date: 08/07/2017 CLINICAL DATA:  Shortness of breath today.  Chest tube present. EXAM: PORTABLE CHEST 1 VIEW COMPARISON:  Chest x-rays dated 08/06/2017 in 08/04/2017. Chest CT dated 08/04/2017. FINDINGS: Two LEFT-sided chest tubes are stable in position. The small loculated LEFT pleural effusion and/or pleural thickening is stable compared to yesterday's most recent chest x-ray, improved compared to earlier exams. Probable associated atelectasis at the LEFT lung base. RIGHT lung is clear. Stable cardiomegaly. IMPRESSION: No change compared  to yesterday's most recent chest x-ray. The small loculated LEFT pleural effusion and/or pleural thickening is unchanged. LEFT-sided chest tubes are stable in position. Electronically Signed   By: Bary Richard M.D.   On: 08/07/2017 09:09   Dg Chest Port 1 View  Result Date: 08/06/2017 CLINICAL DATA:  Status post VATS with empyema drainage/decortication EXAM: PORTABLE CHEST 1 VIEW COMPARISON:  08/06/2017 at 0701 hours FINDINGS: Postsurgical changes in the left hemithorax with small loculated left pleural effusion/pleural thickening, significantly improved. Two indwelling left chest tubes. No pneumothorax. Right lung is essentially clear. The heart is normal in size. IMPRESSION: Postsurgical changes in the left hemithorax with small loculated left pleural effusion/pleural thickening, significantly improved. Two indwelling left chest tube.  No pneumothorax. Electronically Signed   By: Charline Bills M.D.   On: 08/06/2017 20:39   US Thoracentesis Asp Pleural Space W/img Guide  Result Date: 08/04/2017 INDICATION: Patient with history of worsening left-sided chest discomfort, exertional dyspnea, and left-sided pleural effusion. Request is made for diagnostic and therapeutic thoracentesis. EXAM: ULTRASOUND GUIDED DIAGNOSTIC AND THERAPEUTIC LEFT THORACENTESIS MEDICATIONS: 10 milliliters of 1% lidocaine COMPLICATIONS: None immediate. PROCEDURE: An ultrasound guided thoracentesis was thoroughly discussed with the patient and questions answered. The benefits, risks, alternatives and complications were also discussed. The patient understands and wishes to proceed with the procedure. Written consent was obtained. Ultrasound was performed to localize and mark an adequate pocket of fluid in the left chest. The area was then prepped and draped in the normal sterile fashion. 1% Lidocaine was used for local anesthesia. Under ultrasound  guidance a 6 Fr Safe-T-Centesis catheter was introduced. Thoracentesis was performed. The  catheter was removed and a dressing applied. FINDINGS: A total of approximately 1.8 liters of hazy amber fluid was removed. Samples were sent to the laboratory as requested by the clinical team. IMPRESSION: Successful ultrasound guided left thoracentesis yielding 1.8 liters of pleural fluid. Read by: Elwin Mocha, PA-C Electronically Signed   By: Jolaine Click M.D.   On: 08/04/2017 14:42    bronchoscopy, left VATS, minithoracotomy and drainage, decortication 6/6 by Dr. Tyrone Sage.   Subjective: Feels well. Wants to go home.   Discharge Exam: Vitals:   08/15/17 0406 08/15/17 0809  BP: 134/86 128/83  Pulse: 61 68  Resp: 16 (!) 21  Temp: 98.5 F (36.9 C) 99.1 F (37.3 C)  SpO2: 97%    General: Pt is alert, awake, not in acute distress Cardiovascular: RRR, S1/S2 +, no rubs, no gallops Respiratory: Nonlabored, clear with improved LLL aeration this AM Abdominal: Soft, NT, ND, bowel sounds + Extremities: No edema, no cyanosis Skin: Chest tube sites c/d/i healing with minimal surrounding erythema, mild appropriate tenderness to palpation. Superior incision c/d/i without dehiscence.   Labs: BNP (last 3 results) No results for input(s): BNP in the last 8760 hours. Basic Metabolic Panel: Recent Labs  Lab 08/09/17 0217 08/10/17 0234 08/14/17 0322  NA 137 139 137  K 3.3* 4.0 3.7  CL 109 111 107  CO2 18* 19* 23  GLUCOSE 93 100* 101*  BUN 8 5* 7  CREATININE 0.70 0.66 0.85  CALCIUM 8.1* 8.3* 8.3*   Liver Function Tests: No results for input(s): AST, ALT, ALKPHOS, BILITOT, PROT, ALBUMIN in the last 168 hours. No results for input(s): LIPASE, AMYLASE in the last 168 hours. No results for input(s): AMMONIA in the last 168 hours. CBC: Recent Labs  Lab 08/09/17 0217 08/10/17 0234 08/14/17 0322  WBC 9.1 9.7 9.4  HGB 16.4 16.4 15.3  HCT 48.1 47.9 45.1  MCV 102.1* 100.2* 100.0  PLT 339 353 323   Cardiac Enzymes: No results for input(s): CKTOTAL, CKMB, CKMBINDEX, TROPONINI in the  last 168 hours. BNP: Invalid input(s): POCBNP CBG: No results for input(s): GLUCAP in the last 168 hours. D-Dimer No results for input(s): DDIMER in the last 72 hours. Hgb A1c No results for input(s): HGBA1C in the last 72 hours. Lipid Profile No results for input(s): CHOL, HDL, LDLCALC, TRIG, CHOLHDL, LDLDIRECT in the last 72 hours. Thyroid function studies No results for input(s): TSH, T4TOTAL, T3FREE, THYROIDAB in the last 72 hours.  Invalid input(s): FREET3 Anemia work up No results for input(s): VITAMINB12, FOLATE, FERRITIN, TIBC, IRON, RETICCTPCT in the last 72 hours. Urinalysis    Component Value Date/Time   COLORURINE AMBER (A) 08/06/2017 1429   APPEARANCEUR CLEAR 08/06/2017 1429   LABSPEC 1.026 08/06/2017 1429   PHURINE 6.0 08/06/2017 1429   GLUCOSEU NEGATIVE 08/06/2017 1429   HGBUR LARGE (A) 08/06/2017 1429   BILIRUBINUR MODERATE (A) 08/06/2017 1429   KETONESUR NEGATIVE 08/06/2017 1429   PROTEINUR NEGATIVE 08/06/2017 1429   NITRITE NEGATIVE 08/06/2017 1429   LEUKOCYTESUR NEGATIVE 08/06/2017 1429    Microbiology Recent Results (from the past 240 hour(s))  MRSA PCR Screening     Status: None   Collection Time: 08/06/17  3:58 PM  Result Value Ref Range Status   MRSA by PCR NEGATIVE NEGATIVE Final    Comment:        The GeneXpert MRSA Assay (FDA approved for NASAL specimens only), is one component of a  comprehensive MRSA colonization surveillance program. It is not intended to diagnose MRSA infection nor to guide or monitor treatment for MRSA infections. Performed at [facility], [address] [place], [place] [address]   Anaerobic culture     Status: None   Collection Time: 08/06/17  5:29 PM  Result Value Ref Range Status   Specimen Description BRONCHIAL WASHINGS LEFT  Final   Special Requests NONE  Final   Culture   Final    NO ANAEROBES ISOLATED Performed at [facility], [address] [place], [place] [address]    Report Status  08/12/2017 FINAL  Final  Acid Fast Smear (AFB)     Status: None   Collection Time: 08/06/17  5:29 PM  Result Value Ref Range Status   AFB Specimen Processing Concentration  Final   Acid Fast Smear Negative  Final    Comment: (NOTE) Performed At: [facility] [address] [place], [place] [id number] Jolene SchimkeNagendra Sanjai MD WG:[id number]:[phone]    Source (AFB) BRONCHIAL WASHINGS  Final    Comment: LEFT  Culture, respiratory (NON-Expectorated)     Status: None   Collection Time: 08/06/17  5:29 PM  Result Value Ref Range Status   Specimen Description BRONCHIAL WASHINGS LEFT  Final   Special Requests NONE  Final   Gram Stain   Final    RARE WBC PRESENT, PREDOMINANTLY PMN NO ORGANISMS SEEN    Culture   Final    NO GROWTH 2 DAYS Performed at [facility], [address] [place], [place] [address]    Report Status 08/09/2017 FINAL  Final  Aerobic/Anaerobic Culture (surgical/deep wound)     Status: None   Collection Time: 08/06/17  6:20 PM  Result Value Ref Range Status   Specimen Description FLUID PLEURAL LEFT  Final   Special Requests NONE  Final   Gram Stain   Final    ABUNDANT WBC PRESENT, PREDOMINANTLY PMN NO ORGANISMS SEEN    Culture   Final    No growth aerobically or anaerobically. Performed at [facility], [address] [place], [place] [address]    Report Status 08/11/2017 FINAL  Final  Aerobic/Anaerobic Culture (surgical/deep wound)     Status: None (Preliminary result)   Collection Time: 08/06/17  6:29 PM  Result Value Ref Range Status   Specimen Description TISSUE LEFT PLEURAL  Final   Special Requests PLEURAL PEEL  Final   Gram Stain   Final    ABUNDANT WBC PRESENT, PREDOMINANTLY PMN NO ORGANISMS SEEN    Culture   Final    FEW PEPTOSTREPTOCOCCUS MICROS CULTURE REINCUBATED FOR BETTER GROWTH Performed at [facility], [address] [place], [place] [address]    Report Status PENDING  Incomplete    Time coordinating discharge:  Approximately 40 minutes  Tyrone Nineyan B Shelia Kingsberry, MD  [facility] 08/15/2017, 9:38 AM Pager [phone]

## 2017-08-16 LAB — AEROBIC/ANAEROBIC CULTURE W GRAM STAIN (SURGICAL/DEEP WOUND)

## 2017-08-16 LAB — AEROBIC/ANAEROBIC CULTURE (SURGICAL/DEEP WOUND)

## 2017-08-19 ENCOUNTER — Ambulatory Visit (INDEPENDENT_AMBULATORY_CARE_PROVIDER_SITE_OTHER): Payer: Self-pay

## 2017-08-19 DIAGNOSIS — Z4802 Encounter for removal of sutures: Secondary | ICD-10-CM

## 2017-08-19 NOTE — Progress Notes (Signed)
Removed 4 sutures from chest tube sites, no signs of infection and patient tolerated well. 

## 2017-08-28 ENCOUNTER — Other Ambulatory Visit: Payer: Self-pay | Admitting: Cardiothoracic Surgery

## 2017-08-28 DIAGNOSIS — J90 Pleural effusion, not elsewhere classified: Secondary | ICD-10-CM

## 2017-08-31 ENCOUNTER — Ambulatory Visit (INDEPENDENT_AMBULATORY_CARE_PROVIDER_SITE_OTHER): Payer: Self-pay | Admitting: Physician Assistant

## 2017-08-31 ENCOUNTER — Other Ambulatory Visit: Payer: Self-pay

## 2017-08-31 ENCOUNTER — Other Ambulatory Visit: Payer: Self-pay | Admitting: *Deleted

## 2017-08-31 ENCOUNTER — Ambulatory Visit
Admission: RE | Admit: 2017-08-31 | Discharge: 2017-08-31 | Disposition: A | Discharge status: Home / Self Care | Payer: Self-pay | Source: Ambulatory Visit | Attending: Cardiothoracic Surgery | Admitting: Cardiothoracic Surgery

## 2017-08-31 VITALS — BP 136/95 | HR 95 | Resp 16 | Ht 73.0 in | Wt 248.8 lb

## 2017-08-31 DIAGNOSIS — J90 Pleural effusion, not elsewhere classified: Secondary | ICD-10-CM

## 2017-08-31 DIAGNOSIS — J869 Pyothorax without fistula: Secondary | ICD-10-CM

## 2017-08-31 DIAGNOSIS — Z09 Encounter for follow-up examination after completed treatment for conditions other than malignant neoplasm: Secondary | ICD-10-CM

## 2017-08-31 NOTE — Progress Notes (Signed)
[address]       [place] [id number]             [phone]    Bryce Mendoza is a 57 y.o. male patient s/p VATS, minithoracotomy, drainage of left empyema with decortication by Dr. Tyrone SageGerhardt on 08/06/2017.   No diagnosis found. History reviewed. No pertinent past medical history. No past surgical history pertinent negatives on file.   Scheduled Meds: Current Outpatient Medications on File Prior to Visit  Medication Sig Dispense Refill  . amoxicillin-clavulanate (AUGMENTIN) 875-125 MG tablet Take 1 tablet by mouth every 12 (twelve) hours. 14 tablet 0  . traMADol (ULTRAM) 50 MG tablet Take 2 tablets (100 mg total) by mouth every 6 (six) hours as needed for severe pain (pain). 15 tablet 0   No current facility-administered medications on file prior to visit.     There were no vitals taken for this visit.  Subjective  [name] presents today status post VATS procedure with drainage of empyema decortication on 08/06/2016.  Overall he is feeling well.  He does still become quite fatigued since he is now back to work.  He also still has the occasional incisional pain.  He shares he has good days and bad days.  Objective   Cor: Regular rate and rhythm, no murmur Pulm: Clear to auscultation in all fields except the left middle and left lower lobe which had diminished breath sounds. Abd: Soft, nontender Ext: No edema Wound: Thoracotomy incision is clean and dry.  Chest tube suture sites are clean without drainage.  CLINICAL DATA:  History of empyema. Patient reports shortness of breath and chest pain. History of smoking.  EXAM: CHEST - 2 VIEW  COMPARISON:  PA and lateral chest x-ray of August 14, 2017  FINDINGS: The volume of fluid present in the loculated collection on the left has increased while the amount of air has decreased. The fluid collection has a convex border oriented toward the mediastinum. The right lung is well-expanded and clear. The heart  and pulmonary vascularity are normal.  IMPRESSION: Increased size of the loculated fluid collection on the left with decreased amount of air within the collection. No mediastinal shift. Clear right lung.   Electronically Signed   By: David  SwazilandJordan M.D.   On: 08/31/2017 12:59  Assessment & Plan   [name] is a 57 year old male patient status post VATS with a drainage of an empyema and decortication on 08/06/2017.  Patient presents today for his first follow-up visit.  He shares that he has had some weakness and finds it difficult to make it through an 8-hour workday.  He does still have some incisional pain.  He states that he does still get short of breath and he is tired.  He states that his shortness of breath has improved since before surgery and his pain is getting better.  He is no longer taking any pain medication.  He already got his sutures removed in the office a few weeks ago.  His incisions are clean and dry with no signs of infection.  He did finish his full week of antibiotics prescribed by the medicine service.  He has not had any fevers, chills, or night sweats.  He has had no other signs of infection.  However, on chest x-ray there has been increase in fluid and a decrease in air on the left side.  On exam he does have diminished left middle and lower lobe breath sounds.  He does not have a primary care provider, therefore has not made an appointment.  I suggested getting a primary care provider since he will probably need some lab work in the future.  I have asked Dr. Tyrone Sage to review the chest x-ray since it does appear to be slightly worse than when he left the hospital. Plan to do a CT-guided pigtail placement (left) for loculated pleural effusion. He will follow-up next week in our office with a CXR.  The patient had plans to travel on Thursday to [place] to play for his sister's wedding.  I suggested not traveling at this time since he will be having a pigtail catheter placed.   Dr. Tyrone Sage or myself feel that it is imperative that the pigtail catheter is placed either today or tomorrow so, this is not intervention that can wait till after the wedding.  Sharlene Dory 08/31/2017

## 2017-08-31 NOTE — Patient Instructions (Addendum)
Will order drainage, culture and CT-guided pigtail placement (left) for loculated pleural effusion.  Then discharged home with a mini express device.  He will follow-up in our office next week with a repeat chest x-ray to determine whether or not it is appropriate to discontinue the pigtail catheter at this time.   Plan for the patient was discussed with Dr. Tyrone SageGerhardt.  Continue to use your incentive spirometer several times an hour.  Continue to ambulate.

## 2017-08-31 NOTE — Progress Notes (Unsigned)
Ct guid

## 2017-09-01 ENCOUNTER — Encounter (HOSPITAL_COMMUNITY): Payer: Self-pay

## 2017-09-01 ENCOUNTER — Ambulatory Visit (HOSPITAL_COMMUNITY)
Admission: RE | Admit: 2017-09-01 | Discharge: 2017-09-01 | Disposition: A | Discharge status: Home / Self Care | Payer: Self-pay | Source: Ambulatory Visit | Attending: Cardiothoracic Surgery | Admitting: Cardiothoracic Surgery

## 2017-09-01 ENCOUNTER — Other Ambulatory Visit: Payer: Self-pay | Admitting: *Deleted

## 2017-09-01 DIAGNOSIS — J90 Pleural effusion, not elsewhere classified: Secondary | ICD-10-CM

## 2017-09-01 MED ORDER — LIDOCAINE HCL (PF) 2 % IJ SOLN
INTRAMUSCULAR | Status: AC
  Filled 2017-09-01: qty 20

## 2017-09-01 NOTE — Progress Notes (Unsigned)
i

## 2017-09-02 ENCOUNTER — Other Ambulatory Visit: Payer: Self-pay | Admitting: Radiology

## 2017-09-04 ENCOUNTER — Encounter (HOSPITAL_COMMUNITY): Payer: Self-pay

## 2017-09-04 ENCOUNTER — Ambulatory Visit (HOSPITAL_COMMUNITY)
Admission: RE | Admit: 2017-09-04 | Discharge: 2017-09-04 | Disposition: A | Discharge status: Home / Self Care | Payer: Self-pay | Source: Ambulatory Visit | Attending: Cardiothoracic Surgery | Admitting: Cardiothoracic Surgery

## 2017-09-04 ENCOUNTER — Ambulatory Visit (HOSPITAL_COMMUNITY)
Admission: RE | Admit: 2017-09-04 | Discharge: 2017-09-04 | Disposition: A | Discharge status: Home / Self Care | Payer: Self-pay | Source: Ambulatory Visit | Attending: Interventional Radiology | Admitting: Interventional Radiology

## 2017-09-04 ENCOUNTER — Other Ambulatory Visit: Payer: Self-pay | Admitting: Cardiothoracic Surgery

## 2017-09-04 DIAGNOSIS — J90 Pleural effusion, not elsewhere classified: Secondary | ICD-10-CM

## 2017-09-04 DIAGNOSIS — Z87891 Personal history of nicotine dependence: Secondary | ICD-10-CM | POA: Insufficient documentation

## 2017-09-04 DIAGNOSIS — J948 Other specified pleural conditions: Secondary | ICD-10-CM | POA: Insufficient documentation

## 2017-09-04 DIAGNOSIS — Z938 Other artificial opening status: Secondary | ICD-10-CM | POA: Insufficient documentation

## 2017-09-04 DIAGNOSIS — R0602 Shortness of breath: Secondary | ICD-10-CM

## 2017-09-04 DIAGNOSIS — J869 Pyothorax without fistula: Secondary | ICD-10-CM | POA: Insufficient documentation

## 2017-09-04 LAB — CBC WITH DIFFERENTIAL/PLATELET
BASOS PCT: 0 %
Basophils Absolute: 0 10*3/uL (ref 0.0–0.1)
EOS ABS: 0 10*3/uL (ref 0.0–0.7)
Eosinophils Relative: 0 %
HCT: 50.9 % (ref 39.0–52.0)
HEMOGLOBIN: 18.2 g/dL — AB (ref 13.0–17.0)
Lymphocytes Relative: 21 %
Lymphs Abs: 2 10*3/uL (ref 0.7–4.0)
MCH: 36.8 pg — ABNORMAL HIGH (ref 26.0–34.0)
MCHC: 35.8 g/dL (ref 30.0–36.0)
MCV: 103 fL — ABNORMAL HIGH (ref 78.0–100.0)
MONOS PCT: 11 %
Monocytes Absolute: 1 10*3/uL (ref 0.1–1.0)
NEUTROS PCT: 68 %
Neutro Abs: 6.3 10*3/uL (ref 1.7–7.7)
PLATELETS: 146 10*3/uL — AB (ref 150–400)
RBC: 4.94 MIL/uL (ref 4.22–5.81)
RDW: 17.6 % — ABNORMAL HIGH (ref 11.5–15.5)
WBC: 9.4 10*3/uL (ref 4.0–10.5)

## 2017-09-04 LAB — COMPREHENSIVE METABOLIC PANEL
ALBUMIN: 2.9 g/dL — AB (ref 3.5–5.0)
ALK PHOS: 192 U/L — AB (ref 38–126)
ALT: 11 U/L (ref 0–44)
ANION GAP: 13 (ref 5–15)
AST: 17 U/L (ref 15–41)
BUN: 5 mg/dL — ABNORMAL LOW (ref 6–20)
CALCIUM: 8.2 mg/dL — AB (ref 8.9–10.3)
CHLORIDE: 109 mmol/L (ref 98–111)
CO2: 20 mmol/L — AB (ref 22–32)
Creatinine, Ser: 0.58 mg/dL — ABNORMAL LOW (ref 0.61–1.24)
GFR calc Af Amer: >60 mL/min (ref >60)
GFR calc non Af Amer: >60 mL/min (ref >60)
GLUCOSE: 175 mg/dL — AB (ref 70–99)
POTASSIUM: 3.2 mmol/L — AB (ref 3.5–5.1)
SODIUM: 142 mmol/L (ref 135–145)
Total Bilirubin: 1.5 mg/dL — ABNORMAL HIGH (ref 0.3–1.2)
Total Protein: 6.8 g/dL (ref 6.5–8.1)

## 2017-09-04 LAB — PROTIME-INR
INR: 1.21
Prothrombin Time: 15.2 seconds (ref 11.4–15.2)

## 2017-09-04 MED ORDER — OXYCODONE-ACETAMINOPHEN 5-325 MG PO TABS
ORAL_TABLET | ORAL | Status: AC
  Administered 2017-09-04: 2 via ORAL
  Filled 2017-09-04: qty 2

## 2017-09-04 MED ORDER — CEFAZOLIN SODIUM-DEXTROSE 2-4 GM/100ML-% IV SOLN
2.0000 g | INTRAVENOUS | Status: AC
  Administered 2017-09-04: 2 g via INTRAVENOUS

## 2017-09-04 MED ORDER — FLUMAZENIL 0.5 MG/5ML IV SOLN
INTRAVENOUS | Status: AC
  Filled 2017-09-04: qty 5

## 2017-09-04 MED ORDER — FENTANYL CITRATE (PF) 100 MCG/2ML IJ SOLN
INTRAMUSCULAR | Status: AC | PRN
  Administered 2017-09-04 (×2): 50 ug via INTRAVENOUS

## 2017-09-04 MED ORDER — MIDAZOLAM HCL 2 MG/2ML IJ SOLN
INTRAMUSCULAR | Status: AC | PRN
  Administered 2017-09-04 (×3): 1 mg via INTRAVENOUS

## 2017-09-04 MED ORDER — CEFAZOLIN SODIUM-DEXTROSE 2-4 GM/100ML-% IV SOLN
INTRAVENOUS | Status: AC
  Filled 2017-09-04: qty 100

## 2017-09-04 MED ORDER — NALOXONE HCL 0.4 MG/ML IJ SOLN
INTRAMUSCULAR | Status: AC
  Filled 2017-09-04: qty 1

## 2017-09-04 MED ORDER — FENTANYL CITRATE (PF) 100 MCG/2ML IJ SOLN
INTRAMUSCULAR | Status: AC
  Filled 2017-09-04: qty 4

## 2017-09-04 MED ORDER — SODIUM CHLORIDE 0.9 % IV SOLN
INTRAVENOUS | Status: DC
Start: 2017-09-04 — End: 2017-09-05
  Administered 2017-09-04: 09:00:00 via INTRAVENOUS

## 2017-09-04 MED ORDER — MIDAZOLAM HCL 2 MG/2ML IJ SOLN
INTRAMUSCULAR | Status: AC
  Filled 2017-09-04: qty 4

## 2017-09-04 MED ORDER — OXYCODONE-ACETAMINOPHEN 5-325 MG PO TABS
2.0000 | ORAL_TABLET | Freq: Once | ORAL | Status: AC
  Administered 2017-09-04: 2 via ORAL
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
## 2017-09-04 NOTE — Discharge Instructions (Signed)
You have a drain attached to your left rib area. This drain leads to a collections drainage container. At the bottom left front of that container is a port in which you connect the syringes to once the container becomes full. You can dispose of your drainage in the toilet. You will continue to do this until you follow up with Dr. Tyrone SageGerhardt next week.   If your need more information or want to see a visual of what you are to do go to youtube.com, in the search bar enter "[facility] Patient Education ". This is useful information created by another hospital. DO NOT call the numbers mentioned in the video. It is a useful video for your education.   Chest Tube Insertion, Adult, Care After This sheet gives you information about how to care for yourself after your procedure. Your health care provider may also give you more specific instructions. If you have problems or questions, contact your health care provider. What can I expect after the procedure? After the procedure, it is common to have:  Mild discomfort.  Slight bruising.  Follow these instructions at home: Incision care   Follow instructions from your health care provider about how to take care of your incision. Make sure you: ? Wash your hands with soap and water before you change your bandage (dressing). If soap and water are not available, use hand sanitizer. ? Change your dressing as told by your health care provider. ? Leave stitches (sutures), skin glue, or adhesive strips in place. These skin closures may need to stay in place for 2 weeks or longer. If adhesive strip edges start to loosen and curl up, you may trim the loose edges. Do not remove adhesive strips completely unless your health care provider tells you to do that.  Check your incision area every day for signs of infection. Check for: ? More redness, swelling, or pain. ? More fluid or blood. ? Warmth. ? Pus or a bad smell.  Do not take baths, swim,  or use a hot tub until your health care provider approves. Ask your health care provider if you can take showers or have a sponge bath. You may need to keep the incision area dry for a few days. General instructions  Take over-the-counter and prescription medicines only as told by your health care provider.  Return to your normal activities as told by your health care provider. Ask your health care provider what activities are safe.  Keep all follow-up visits as told by your health care provider. This is important.  Do not drive for 24 hours if you were given a medicine to help you relax (sedative). Contact a health care provider if:  You have chills or fever.  You have pain that is not controlled with medicine.  You have more redness, swelling, or pain around your incision.  You have more fluid or blood coming from your incision.  Your incision feels warm to the touch.  You have pus or a bad smell coming from the incision. Get help right away if:  You have chest pain or trouble breathing.  You develop red streaking on your skin around your incision. This information is not intended to replace advice given to you by your health care provider. Make sure you discuss any questions you have with your health care provider. Document Released: 12/08/2012 Document Revised: 09/07/2015 Document Reviewed: 08/01/2015 Elsevier Interactive Patient Education  2018 Elsevier Inc[employer]    Moderate Conscious Sedation, Adult,  Care After These instructions provide you with information about caring for yourself after your procedure. Your health care provider may also give you more specific instructions. Your treatment has been planned according to current medical practices, but problems sometimes occur. Call your health care provider if you have any problems or questions after your procedure. What can I expect after the procedure? After your procedure, it is common:  To feel sleepy for several  hours.  To feel clumsy and have poor balance for several hours.  To have poor judgment for several hours.  To vomit if you eat too soon.  Follow these instructions at home: For at least 24 hours after the procedure:   Do not: ? Participate in activities where you could fall or become injured. ? Drive. ? Use heavy machinery. ? Drink alcohol. ? Take sleeping pills or medicines that cause drowsiness. ? Make important decisions or sign legal documents. ? Take care of children on your own.  Rest. Eating and drinking  Follow the diet recommended by your health care provider.  If you vomit: ? Drink water, juice, or soup when you can drink without vomiting. ? Make sure you have little or no nausea before eating solid foods. General instructions  Have a responsible adult stay with you until you are awake and alert.  Take over-the-counter and prescription medicines only as told by your health care provider.  If you smoke, do not smoke without supervision.  Keep all follow-up visits as told by your health care provider. This is important. Contact a health care provider if:  You keep feeling nauseous or you keep vomiting.  You feel light-headed.  You develop a rash.  You have a fever. Get help right away if:  You have trouble breathing. This information is not intended to replace advice given to you by your health care provider. Make sure you discuss any questions you have with your health care provider. Document Released: 12/08/2012 Document Revised: 07/23/2015 Document Reviewed: 06/09/2015 Elsevier Interactive Patient Education  [employer].

## 2017-09-04 NOTE — Procedures (Signed)
Pre procedural Dx: Left sided empyema Post procedural Dx: Same  Technically successful CT guided placed of a 12 Fr drainage catheter placement into the left pleural space yielding 550 cc if slightly blood tinged pleural fluid.    All aspirated samples sent to the laboratory for analysis.    EBL: Minimal  Complications: None immediate  Katherina RightJay Tomasina Keasling, MD Pager #: [phone]

## 2017-09-08 ENCOUNTER — Emergency Department (HOSPITAL_BASED_OUTPATIENT_CLINIC_OR_DEPARTMENT_OTHER): Payer: Self-pay

## 2017-09-08 ENCOUNTER — Emergency Department (HOSPITAL_COMMUNITY): Payer: Self-pay

## 2017-09-08 ENCOUNTER — Inpatient Hospital Stay (HOSPITAL_COMMUNITY)
Admission: EM | Admit: 2017-09-08 | Discharge: 2017-09-14 | DRG: 299 | Disposition: A | Discharge status: Home / Self Care | Payer: Self-pay | Attending: Family Medicine | Admitting: Family Medicine

## 2017-09-08 ENCOUNTER — Encounter (HOSPITAL_COMMUNITY): Payer: Self-pay

## 2017-09-08 ENCOUNTER — Other Ambulatory Visit: Payer: Self-pay

## 2017-09-08 ENCOUNTER — Telehealth: Payer: Self-pay

## 2017-09-08 ENCOUNTER — Ambulatory Visit: Payer: Self-pay

## 2017-09-08 DIAGNOSIS — D696 Thrombocytopenia, unspecified: Secondary | ICD-10-CM | POA: Diagnosis present

## 2017-09-08 DIAGNOSIS — I2699 Other pulmonary embolism without acute cor pulmonale: Secondary | ICD-10-CM

## 2017-09-08 DIAGNOSIS — Z807 Family history of other malignant neoplasms of lymphoid, hematopoietic and related tissues: Secondary | ICD-10-CM

## 2017-09-08 DIAGNOSIS — F17210 Nicotine dependence, cigarettes, uncomplicated: Secondary | ICD-10-CM | POA: Diagnosis present

## 2017-09-08 DIAGNOSIS — T81718A Complication of other artery following a procedure, not elsewhere classified, initial encounter: Principal | ICD-10-CM | POA: Diagnosis present

## 2017-09-08 DIAGNOSIS — J90 Pleural effusion, not elsewhere classified: Secondary | ICD-10-CM

## 2017-09-08 DIAGNOSIS — J869 Pyothorax without fistula: Secondary | ICD-10-CM | POA: Diagnosis present

## 2017-09-08 DIAGNOSIS — I82431 Acute embolism and thrombosis of right popliteal vein: Secondary | ICD-10-CM | POA: Diagnosis present

## 2017-09-08 DIAGNOSIS — I82441 Acute embolism and thrombosis of right tibial vein: Secondary | ICD-10-CM | POA: Diagnosis present

## 2017-09-08 DIAGNOSIS — R748 Abnormal levels of other serum enzymes: Secondary | ICD-10-CM | POA: Diagnosis present

## 2017-09-08 DIAGNOSIS — I82409 Acute embolism and thrombosis of unspecified deep veins of unspecified lower extremity: Secondary | ICD-10-CM | POA: Diagnosis present

## 2017-09-08 DIAGNOSIS — I10 Essential (primary) hypertension: Secondary | ICD-10-CM | POA: Diagnosis present

## 2017-09-08 DIAGNOSIS — Z4682 Encounter for fitting and adjustment of non-vascular catheter: Secondary | ICD-10-CM

## 2017-09-08 DIAGNOSIS — I82411 Acute embolism and thrombosis of right femoral vein: Secondary | ICD-10-CM | POA: Diagnosis present

## 2017-09-08 DIAGNOSIS — Y842 Radiological procedure and radiotherapy as the cause of abnormal reaction of the patient, or of later complication, without mention of misadventure at the time of the procedure: Secondary | ICD-10-CM | POA: Diagnosis present

## 2017-09-08 DIAGNOSIS — I824Z1 Acute embolism and thrombosis of unspecified deep veins of right distal lower extremity: Secondary | ICD-10-CM | POA: Diagnosis present

## 2017-09-08 DIAGNOSIS — T8172XA Complication of vein following a procedure, not elsewhere classified, initial encounter: Secondary | ICD-10-CM | POA: Diagnosis present

## 2017-09-08 DIAGNOSIS — D6959 Other secondary thrombocytopenia: Secondary | ICD-10-CM | POA: Diagnosis present

## 2017-09-08 HISTORY — DX: Other pulmonary embolism without acute cor pulmonale: I26.99

## 2017-09-08 HISTORY — DX: Pyothorax without fistula: J86.9

## 2017-09-08 LAB — CBC WITH DIFFERENTIAL/PLATELET
Abs Immature Granulocytes: 0.1 10*3/uL (ref 0.0–0.1)
BASOS PCT: 1 %
Basophils Absolute: 0.1 10*3/uL (ref 0.0–0.1)
EOS ABS: 0.1 10*3/uL (ref 0.0–0.7)
EOS PCT: 1 %
HCT: 50.6 % (ref 39.0–52.0)
Hemoglobin: 17.4 g/dL — ABNORMAL HIGH (ref 13.0–17.0)
Immature Granulocytes: 1 %
LYMPHS PCT: 26 %
Lymphs Abs: 2.4 10*3/uL (ref 0.7–4.0)
MCH: 36 pg — ABNORMAL HIGH (ref 26.0–34.0)
MCHC: 34.4 g/dL (ref 30.0–36.0)
MCV: 104.5 fL — ABNORMAL HIGH (ref 78.0–100.0)
Monocytes Absolute: 0.9 10*3/uL (ref 0.1–1.0)
Monocytes Relative: 10 %
Neutro Abs: 5.6 10*3/uL (ref 1.7–7.7)
Neutrophils Relative %: 61 %
PLATELETS: 121 10*3/uL — AB (ref 150–400)
RBC: 4.84 MIL/uL (ref 4.22–5.81)
RDW: 18.7 % — AB (ref 11.5–15.5)
WBC: 9.1 10*3/uL (ref 4.0–10.5)

## 2017-09-08 LAB — COMPREHENSIVE METABOLIC PANEL
ALBUMIN: 2.8 g/dL — AB (ref 3.5–5.0)
ALT: 14 U/L (ref 0–44)
ANION GAP: 13 (ref 5–15)
AST: 24 U/L (ref 15–41)
Alkaline Phosphatase: 156 U/L — ABNORMAL HIGH (ref 38–126)
BUN: 6 mg/dL (ref 6–20)
CO2: 19 mmol/L — AB (ref 22–32)
Calcium: 8.5 mg/dL — ABNORMAL LOW (ref 8.9–10.3)
Chloride: 106 mmol/L (ref 98–111)
Creatinine, Ser: 0.89 mg/dL (ref 0.61–1.24)
GFR calc non Af Amer: >60 mL/min (ref >60)
GLUCOSE: 130 mg/dL — AB (ref 70–99)
POTASSIUM: 3.5 mmol/L (ref 3.5–5.1)
SODIUM: 138 mmol/L (ref 135–145)
Total Bilirubin: 1.3 mg/dL — ABNORMAL HIGH (ref 0.3–1.2)
Total Protein: 6.1 g/dL — ABNORMAL LOW (ref 6.5–8.1)

## 2017-09-08 LAB — PROTIME-INR
INR: 1.25
Prothrombin Time: 15.6 seconds — ABNORMAL HIGH (ref 11.4–15.2)

## 2017-09-08 LAB — HEPARIN LEVEL (UNFRACTIONATED): HEPARIN UNFRACTIONATED: 0.38 [IU]/mL (ref 0.30–0.70)

## 2017-09-08 MED ORDER — HYDROCODONE-ACETAMINOPHEN 5-325 MG PO TABS
1.0000 | ORAL_TABLET | Freq: Four times a day (QID) | ORAL | Status: DC | PRN
Start: 2017-09-08 — End: 2017-09-08
  Administered 2017-09-08: 1 via ORAL
  Filled 2017-09-08: qty 1

## 2017-09-08 MED ORDER — ACETAMINOPHEN 325 MG PO TABS
650.0000 mg | ORAL_TABLET | Freq: Four times a day (QID) | ORAL | Status: DC | PRN
  Administered 2017-09-09: 650 mg via ORAL
  Filled 2017-09-08: qty 2

## 2017-09-08 MED ORDER — OXYCODONE HCL 5 MG PO TABS
5.0000 mg | ORAL_TABLET | ORAL | Status: DC | PRN
  Administered 2017-09-08 – 2017-09-13 (×16): 5 mg via ORAL
  Filled 2017-09-08 (×16): qty 1

## 2017-09-08 MED ORDER — ACETAMINOPHEN 650 MG RE SUPP: 650.0000 mg | Freq: Four times a day (QID) | RECTAL | Status: DC | PRN

## 2017-09-08 MED ORDER — SODIUM CHLORIDE 0.9% FLUSH
3.0000 mL | Freq: Two times a day (BID) | INTRAVENOUS | Status: DC
  Administered 2017-09-09 – 2017-09-14 (×9): 3 mL via INTRAVENOUS

## 2017-09-08 MED ORDER — ONDANSETRON HCL 4 MG/2ML IJ SOLN: 4.0000 mg | Freq: Four times a day (QID) | INTRAMUSCULAR | Status: DC | PRN

## 2017-09-08 MED ORDER — ONDANSETRON HCL 4 MG PO TABS: 4.0000 mg | ORAL_TABLET | Freq: Four times a day (QID) | ORAL | Status: DC | PRN

## 2017-09-08 MED ORDER — IOPAMIDOL (ISOVUE-370) INJECTION 76%
100.0000 mL | Freq: Once | INTRAVENOUS | Status: AC | PRN
  Administered 2017-09-08: 100 mL via INTRAVENOUS

## 2017-09-08 MED ORDER — HYDRALAZINE HCL 20 MG/ML IJ SOLN: 10.0000 mg | INTRAMUSCULAR | Status: DC | PRN

## 2017-09-08 MED ORDER — HEPARIN BOLUS VIA INFUSION
6500.0000 [IU] | Freq: Once | INTRAVENOUS | Status: AC
  Administered 2017-09-08: 6500 [IU] via INTRAVENOUS
  Filled 2017-09-08: qty 6500

## 2017-09-08 MED ORDER — HEPARIN (PORCINE) IN NACL 100-0.45 UNIT/ML-% IJ SOLN
2400.0000 [IU]/h | INTRAMUSCULAR | Status: DC
Start: 2017-09-08 — End: 2017-09-12
  Administered 2017-09-08: 1850 [IU]/h via INTRAVENOUS
  Administered 2017-09-09: 2000 [IU]/h via INTRAVENOUS
  Administered 2017-09-09: 1850 [IU]/h via INTRAVENOUS
  Administered 2017-09-10: 2200 [IU]/h via INTRAVENOUS
  Administered 2017-09-10: 2100 [IU]/h via INTRAVENOUS
  Administered 2017-09-11: 2200 [IU]/h via INTRAVENOUS
  Administered 2017-09-12 (×2): 2400 [IU]/h via INTRAVENOUS
  Filled 2017-09-08 (×10): qty 250

## 2017-09-08 NOTE — Progress Notes (Signed)
*  Preliminary Results* Right lower extremity venous duplex completed. Right lower extremity is positive for acute deep vein thrombosis involving the right femoral, popliteal, posterior tibial, and gastrocnemius veins. There is evidence of acute superficial vein thrombosis involving the right great saphenous vein in the right calf. There is no evidence of right Baker's cyst.  Preliminary results discussed with Dr. Rubin PayorPickering.  09/08/2017 1:31 PM  Gertie FeyMichelle Rianne Mendoza, BS, RVT, RDCS, RDMS

## 2017-09-08 NOTE — ED Notes (Signed)
Dressing changed at chest tube insertion site; no redness warmth, or swelling at site; clean dry, intact

## 2017-09-08 NOTE — Telephone Encounter (Signed)
Assessment Questions:  When was your surgery date?08/06/17   When were you discharged from the hospital?08/15/17   Who is your Cardiologist?n/a  Have you seen him/ her?   Who is your PCP/ Family Medicine Physician?  Have you seen him/ her?Pt does not have one   1. Symptoms-Bilateral foot numbness/ cold, Shortness of breath, right leg swelling  2. Onset of Symptoms-today  3. Location-legs   4. Severity (how bad is it?)- hurts really bad, shortness of breath has been worse before  5. Are there any signs or symptoms of infection?n/a  a. Redness b. Warm to touch c. Red streaks d. Swelling e. Drainage/ Puss f. Fowl odor/ smell g. Do you have a temperature?   6.  Pain (scale 0- 10)-   7.  Other Symptoms?              Any Chest Pain/ Tightness/ Heaviness-No    If so, is it severe?    If yes, please hang up and dial 911, or go to the nearest Emergency Department.   Shortness of Breath-Yes, not as bad as it has been before.  If so, is it severe?  If yes, please hang up and dial 911, or go to the nearest Emergency Department.  Lower Extremity Edema/ Swelling-Yes, only on the right leg.   If so, are you on a Diuretic (fluid pill)?  Have you been keeping your legs elevated as much as possible when seated?  Pt was to be seen in the office today for follow-up appointment with one of our PA's.  Patient stated that he would feel better if he went to the ER, which I advised that was a good idea given his symptoms.  He also stated that he was getting increased fluid out of his chest tube.  Stated that he just emptied the container and already has 400 ml out.  Red-tinged. Advised patient to keep the office updated and to go to the ED.  Patient acknowledged receipt.
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
## 2017-09-08 NOTE — Progress Notes (Signed)
ANTICOAGULATION CONSULT NOTE - Initial Consult  Pharmacy Consult for Heparin Indication: pulmonary embolus and DVT  No Known Allergies  Patient Measurements: Height: 6\' 1"  (185.4 cm) Weight: 248 lb (112.5 kg) IBW/kg (Calculated) : 79.9 Heparin Dosing Weight: 103.7 kg  Vital Signs: Temp: 99.3 F (37.4 C) (07/09 1150) Temp Source: Oral (07/09 1150) BP: 136/97 (07/09 1500) Pulse Rate: 80 (07/09 1500)  Labs: Recent Labs    09/08/17 1214  HGB 17.4*  HCT 50.6  PLT 121*  LABPROT 15.6*  INR 1.25  CREATININE 0.89    Estimated Creatinine Clearance: 123.2 mL/min (by C-G formula based on SCr of 0.89 mg/dL).   Medical History: History reviewed. No pertinent past medical history.  Medications:  Only APAP and Ibuprofen prior to admission.   Assessment: 57 year old male with history of VATS on 08/04/17 for pleural effusion and empyema with replacement of L-chest tube on 7/5 in place who presented to ED with new pain of the right upper and lower extremity and swelling. Doppler was positive for R-DVT. CT Angio was positive for pulmonary emboli occluding R-pulmonary artery. No central PE noted.   Hgb is elevated at 17.5. Platelets are on low side at 121 (normal for patient is around 300s range).  INR baseline is 1.25.  No bleeding reported.  SCr is 0.89 -stable from previous labs in system.  CrCl >120 mL/min.    Goal of Therapy:  Heparin level 0.3-0.7 units/ml Monitor platelets by anticoagulation protocol: Yes   Plan:  Heparin bolus 6500 units IV x1. Start Heparin drip at 1850 units/hr. Check Heparin level in 6 hours.  Daily Heparin level and CBC while on therapy.   Link SnufferJessica Khalia Gong, PharmD, BCPS, BCCCP Clinical Pharmacist Clinical phone 09/08/2017 until 11:30PM [phone]- #[phone] After hours, please call [phone] 09/08/2017,3:38 PM

## 2017-09-08 NOTE — Progress Notes (Addendum)
ANTICOAGULATION CONSULT NOTE   Pharmacy Consult for Heparin Indication: pulmonary embolus and DVT  No Known Allergies  Patient Measurements: Height: 6\' 1"  (185.4 cm) Weight: 248 lb (112.5 kg) IBW/kg (Calculated) : 79.9 Heparin Dosing Weight: 103.7 kg  Vital Signs: Temp: 98.9 F (37.2 C) (07/09 1749) Temp Source: Oral (07/09 1749) BP: 153/98 (07/09 1749) Pulse Rate: 81 (07/09 1749)  Labs: Recent Labs    09/08/17 1214 09/08/17 2217  HGB 17.4*  --   HCT 50.6  --   PLT 121*  --   LABPROT 15.6*  --   INR 1.25  --   HEPARINUNFRC  --  0.38  CREATININE 0.89  --     Estimated Creatinine Clearance: 123.2 mL/min (by C-G formula based on SCr of 0.89 mg/dL).   Medical History: Past Medical History:  Diagnosis Date  . Empyema lung (HCC)     Medications:  Only APAP and Ibuprofen prior to admission.   Assessment: 57 year old male with history of VATS on 08/04/17 for pleural effusion and empyema with replacement of L-chest tube on 7/5 in place who presented to ED with new pain of the right upper and lower extremity and swelling. Doppler was positive for R-DVT. CT Angio was positive for pulmonary emboli occluding R-pulmonary artery. No central PE noted.   Hgb is elevated at 17.5. Platelets are on low side at 121 (normal for patient is around 300s range).  INR baseline is 1.25.  No bleeding reported.   Initial heparin level is therapeutic 0.38 units/ml    Goal of Therapy:  Heparin level 0.3-0.7 units/ml Monitor platelets by anticoagulation protocol: Yes   Plan:  Continue Heparin drip at 1850 units/hr. Daily Heparin level and CBC while on therapy.   Thanks for allowing pharmacy to be a part of this patient's care.  Talbert CageLora Hakiem Malizia, PharmD Clinical Pharmacist  Addum:  Heparin level down to 0.25 units/hr.  Will increase drip to 2000 units/hr.  Check heparin level in ~ 6 hours Talbert CageLora Pamelia Botto, PharmD

## 2017-09-08 NOTE — ED Triage Notes (Addendum)
Pt has surgery in early June; Pt states "My left lung was filled w/ fluid and infected"; Dr. Tyrone SageGerhardt performed the surgery, [facility]; [facility]; PT C/O increased sob, CP, painful swelling of R leg, numbness in R foot; bloody fluid noted in chamber

## 2017-09-08 NOTE — ED Notes (Signed)
Patient transported to CT 

## 2017-09-09 ENCOUNTER — Observation Stay (HOSPITAL_COMMUNITY): Payer: Self-pay

## 2017-09-09 DIAGNOSIS — I82409 Acute embolism and thrombosis of unspecified deep veins of unspecified lower extremity: Secondary | ICD-10-CM | POA: Diagnosis present

## 2017-09-09 DIAGNOSIS — J90 Pleural effusion, not elsewhere classified: Secondary | ICD-10-CM

## 2017-09-09 DIAGNOSIS — I2602 Saddle embolus of pulmonary artery with acute cor pulmonale: Secondary | ICD-10-CM

## 2017-09-09 DIAGNOSIS — J869 Pyothorax without fistula: Secondary | ICD-10-CM | POA: Diagnosis present

## 2017-09-09 DIAGNOSIS — R0989 Other specified symptoms and signs involving the circulatory and respiratory systems: Secondary | ICD-10-CM

## 2017-09-09 DIAGNOSIS — D696 Thrombocytopenia, unspecified: Secondary | ICD-10-CM | POA: Diagnosis present

## 2017-09-09 HISTORY — DX: Acute embolism and thrombosis of unspecified deep veins of unspecified lower extremity: I82.409

## 2017-09-09 LAB — BASIC METABOLIC PANEL
Anion gap: 8 (ref 5–15)
CHLORIDE: 104 mmol/L (ref 98–111)
CO2: 25 mmol/L (ref 22–32)
CREATININE: 0.73 mg/dL (ref 0.61–1.24)
Calcium: 8 mg/dL — ABNORMAL LOW (ref 8.9–10.3)
GFR calc Af Amer: >60 mL/min (ref >60)
GFR calc non Af Amer: >60 mL/min (ref >60)
Glucose, Bld: 103 mg/dL — ABNORMAL HIGH (ref 70–99)
Potassium: 3.7 mmol/L (ref 3.5–5.1)
SODIUM: 137 mmol/L (ref 135–145)

## 2017-09-09 LAB — CBC
HEMATOCRIT: 46.5 % (ref 39.0–52.0)
HEMOGLOBIN: 15.7 g/dL (ref 13.0–17.0)
MCH: 35.2 pg — AB (ref 26.0–34.0)
MCHC: 33.8 g/dL (ref 30.0–36.0)
MCV: 104.3 fL — ABNORMAL HIGH (ref 78.0–100.0)
Platelets: 110 10*3/uL — ABNORMAL LOW (ref 150–400)
RBC: 4.46 MIL/uL (ref 4.22–5.81)
RDW: 18.4 % — ABNORMAL HIGH (ref 11.5–15.5)
WBC: 8.1 10*3/uL (ref 4.0–10.5)

## 2017-09-09 LAB — HEPARIN LEVEL (UNFRACTIONATED)
Heparin Unfractionated: 0.25 IU/mL — ABNORMAL LOW (ref 0.30–0.70)
Heparin Unfractionated: 0.29 IU/mL — ABNORMAL LOW (ref 0.30–0.70)

## 2017-09-09 LAB — AEROBIC/ANAEROBIC CULTURE (SURGICAL/DEEP WOUND): CULTURE: NO GROWTH

## 2017-09-09 LAB — ECHOCARDIOGRAM COMPLETE
Height: 73 in
Weight: 3968 oz

## 2017-09-09 LAB — VITAMIN B12: Vitamin B-12: 202 pg/mL (ref 180–914)

## 2017-09-09 MED ORDER — CYANOCOBALAMIN 1000 MCG/ML IJ SOLN
1000.0000 ug | INTRAMUSCULAR | Status: DC
  Administered 2017-09-09: 1000 ug via SUBCUTANEOUS
  Filled 2017-09-09: qty 1

## 2017-09-09 MED ORDER — PERFLUTREN LIPID MICROSPHERE
1.0000 mL | INTRAVENOUS | Status: AC | PRN
  Administered 2017-09-09: 3 mL via INTRAVENOUS
  Filled 2017-09-09 (×2): qty 10

## 2017-09-09 NOTE — Care Management Note (Addendum)
Case Management Note  Patient Details  Name: Bryce Mendoza MRN: [id number] Date of Birth: 08/15/1960  Subjective/Objective:     From home alone, presents with PE, RLE DVT, on heparin drip, has chest tube for loculated pl effusion on 7/5.  He has no PCP, or insurance listed.  He has hospital follow [facility] on 8/8 at 10:30, he will be able to [facility] pharmacy [facility] for medication assistance if discharged Mon-Friday, if dc on weekend will need Match letter for med assistance.  7/13 Letha Capeeborah Rosaline Ezekiel RN, BSN - patient is not eligible for the Match,he has already used the Match letter.  He will need generic meds at dc.    7/15 Letha Capeeborah Stuart Guillen RN, BSN - he will be on eliquis, NCM gave him the 30 day savings card and the patient ast application that he needs to take to his follow up apt, also gave him brochures with address and phone information to follow up apt and the [facility] pharmacy.  He also wants a can, [facility], he will bring to patient's rooom.   Patient will need printed scripts.            Action/Plan: DC home when ready.   Expected Discharge Date:                  Expected Discharge Plan:  Home/Self Care  In-House Referral:     Discharge planning Services  CM Consult, Indigent Health Clinic, Follow-up appt scheduled, Medication Assistance  Post Acute Care Choice:    Choice offered to:     DME Arranged:    DME Agency:     HH Arranged:    HH Agency:     Status of Service:  In process, will continue to follow  If discussed at Long Length of Stay Meetings, dates discussed:    Additional Comments:  [name], [name], RN 09/09/2017, 9:57 AM

## 2017-09-09 NOTE — Progress Notes (Signed)
  Echocardiogram 2D Echocardiogram has been performed.  Brilyn Tuller G Curtisha Bendix 09/09/2017, 1:34 PM

## 2017-09-09 NOTE — Progress Notes (Signed)
ANTICOAGULATION CONSULT NOTE  Pharmacy Consult for Heparin Indication: pulmonary embolus and DVT  No Known Allergies  Patient Measurements: Height: 6\' 1"  (185.4 cm) Weight: 248 lb (112.5 kg) IBW/kg (Calculated) : 79.9 Heparin Dosing Weight: 103.7 kg  Vital Signs: Temp: 98.5 F (36.9 C) (07/10 1518) Temp Source: Oral (07/10 1518) BP: 126/98 (07/10 1518) Pulse Rate: 70 (07/10 1518)  Labs: Recent Labs    09/08/17 1214 09/08/17 2217 09/09/17 0538 09/09/17 1512  HGB 17.4*  --  15.7  --   HCT 50.6  --  46.5  --   PLT 121*  --  110*  --   LABPROT 15.6*  --   --   --   INR 1.25  --   --   --   HEPARINUNFRC  --  0.38 0.25* 0.29*  CREATININE 0.89  --  0.73  --     Estimated Creatinine Clearance: 137.1 mL/min (by C-G formula based on SCr of 0.73 mg/dL).   Medical History: Past Medical History:  Diagnosis Date  . Empyema lung [facility](HCC)      Assessment: 57 year old male with history of VATS on 08/04/17 for pleural effusion and empyema with replacement of L-chest tube on 7/5 in place who presented to ED with new pain of the right upper and lower extremity and swelling. Doppler was positive for R-DVT. CT Angio was positive for pulmonary emboli occluding R-pulmonary artery.  -Heparin level = 0.29 on 2000 units/hr   Goal of Therapy:  Heparin level 0.3-0.7 units/ml Monitor platelets by anticoagulation protocol: Yes   Plan: Increase heparin to 2100  units/hr Daily Heparin level and CBC while on therapy.   Harland GermanAndrew Delight Bickle, PharmD Clinical Pharmacist Please check Amion for pharmacy contact number 09/09/2017,5:13 PM
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
## 2017-09-09 NOTE — Progress Notes (Signed)
[facility] PROGRESS NOTE  Bryce Mendoza OZH:[id number] DOB: 1960-09-28 DOA: 09/08/2017   PCP: Patient, No Pcp Per     Assessment/Plan: Principal Problem:   Pulmonary embolism (HCC) Active Problems:   DVT (deep venous thrombosis) (HCC)   Thrombocytopenia (HCC)   Empyema lung (HCC)  57 y.o. male with medical history significant of empyema status post decortication; who presents with right leg swelling and pain.  Patient had recently been hospitalized from 6/4-6/15, after presenting with shortness of breath found to have a left-sided empyema requiring VATS/decortication on 6/6 by Dr. Tyrone Sage. Patient recently had CT-guided catheter placement in the left pleural space on 09/04/17.came in with right lower extremity pain that shows acute DVT  Assessment and plan Right leg DVT and pulmonary embolus: Acute.  Patient presented with complaints of difficulty breathing and  right leg swelling.  Found to have DVT on ultrasound of the lower extremities and right-sided pulmonary embolus on CT angiogram of the chest.  Given the recent decortication and continued bloody drainage there was concern for worsening bleeding. - continue telemetry bed - Heparin drip per pharmacy - oxycodone prn pain overnight - Check echocardiogram - hemoglobin stable overnight, has bloody output from left pigtail catheter connected to suction - Keenan Bachelor, PA-C consulted  Dr.Owen of cardiothoracic surgery, will follow-up for further recommendations anticipate will need to be changed to NOAC , once okay by CTS   Empyema: Patient status post decortication on 6/6 with recent pigtail catheter placed on 7/5.  Fluid was thought to be exudative after analysis and cultures showed no growth.appreciate CT surgery input     Microcytic anemia/Thrombocytopenia: Acute.likely due to B-12 deficiency , will start replacing. RBC folate is pending - Continue to monitor - May warrant further work-up     DVT prophylaxsis heparin  drip  Code Status:  Full code    Family Communication: Discussed in detail with the patient, all imaging results, lab results explained to the patient   Disposition Plan:   Cardiothoracic surgery consultation     Consultants:  Thoracic surgery  Procedures:   none  Antibiotics: Anti-infectives (From admission, onward)   None         HPI/Subjective:  patient  Does have bloody output from left pigtail catheter which is connected to suction  Objective: Vitals:   09/08/17 1648 09/08/17 1749 09/08/17 2318 09/08/17 2319  BP: (!) 159/100 (!) 153/98 124/87   Pulse: 69 81 76 80  Resp: (!) 22 (!) 22 18   Temp:  98.9 F (37.2 C)  99.5 F (37.5 C)  TempSrc:  Oral  Oral  SpO2: 99% 100% 98% 98%  Weight:      Height:        Intake/Output Summary (Last 24 hours) at 09/09/2017 0819 Last data filed at 09/08/2017 1938 Gross per 24 hour  Intake 318.03 ml  Output 0 ml  Net 318.03 ml    Exam:  Examination:  General exam: Appears calm and comfortable  Respiratory system: left pigtail catheter the left pleural space Respiratory effort normal. Cardiovascular system: S1 & S2 heard, RRR. No JVD, murmurs, rubs, gallops or clicks. No pedal edema. Gastrointestinal system: Abdomen is nondistended, soft and nontender. No organomegaly or masses felt. Normal bowel sounds heard. Central nervous system: Alert and oriented. No focal neurological deficits. Extremities: Symmetric 5 x 5 power. Skin: No rashes, lesions or ulcers Psychiatry: Judgement and insight appear normal. Mood & affect appropriate.     Data Reviewed: I have personally  reviewed following labs and imaging studies  Micro Results Recent Results (from the past 240 hour(s))  Aerobic/Anaerobic Culture (surgical/deep wound)     Status: None (Preliminary result)   Collection Time: 09/04/17 12:06 PM  Result Value Ref Range Status   Specimen Description   Final    ABSCESS CHEST LEFT Performed at [facility], [address] [place], [place] [address]    Special Requests NONE  Final   Gram Stain   Final    MODERATE WBC PRESENT, PREDOMINANTLY PMN NO ORGANISMS SEEN    Culture   Final    NO GROWTH 4 DAYS NO ANAEROBES ISOLATED; CULTURE IN PROGRESS FOR 5 DAYS Performed at [facility], [address] [place], [place] [address]    Report Status PENDING  Incomplete    Radiology Reports Dg Chest 2 View  Result Date: 09/08/2017 CLINICAL DATA:  Follow-up LEFT empyema and loculated LEFT pneumothorax with LEFT pleural drainage catheter in place. EXAM: CHEST - 2 VIEW COMPARISON:  09/04/2017, 08/31/2017 and earlier, including CT chest 08/04/2017. FINDINGS: Cardiomediastinal silhouette unremarkable, unchanged. LEFT LATERAL pneumothorax with associated pleural thickening, unchanged since the examination 4 days ago. Improved aeration in the LEFT lower lobe, with moderate passive atelectasis and/or pneumonia persisting. No new pulmonary parenchymal abnormalities in either lung. No RIGHT pleural effusion. Visualized bony thorax intact. IMPRESSION: 1. Stable loculated LEFT LATERAL pneumothorax with associated pleural thickening. 2. Improved aeration in the LEFT lower lobe with moderate residual passive atelectasis and/or pneumonia. 3. No new abnormalities. Electronically Signed   By: Hulan Saashomas  Lawrence M.D.   On: 09/08/2017 13:08   Dg Chest 2 View  Result Date: 08/31/2017 CLINICAL DATA:  History of empyema. Patient reports shortness of breath and chest pain. History of smoking. EXAM: CHEST - 2 VIEW COMPARISON:  PA and lateral chest x-ray of August 14, 2017 FINDINGS: The volume of fluid present in the loculated collection on the left has increased while the amount of air has decreased. The fluid collection has a convex border oriented toward the mediastinum. The right lung is well-expanded and clear. The heart and pulmonary vascularity are normal. IMPRESSION: Increased size of the loculated fluid collection  on the left with decreased amount of air within the collection. No mediastinal shift. Clear right lung. Electronically Signed   By: David  Swazil[place]Jordan M.D.   On: 08/31/2017 12:59   Dg Chest 2 View  Result Date: 08/14/2017 CLINICAL DATA:  Postoperative evaluation.  Prior chest tube removal. EXAM: CHEST - 2 VIEW COMPARISON:  None. FINDINGS: Loculated left base hydropneumothorax again noted, this appears slightly smaller on today's exam. Atelectatic changes left lung base. Heart size stable. No acute bony abnormality. IMPRESSION: Left base hydropneumothorax again noted, this appears slightly smaller on today's exam. Atelectatic changes left base again noted. Electronically Signed   By: Maisie Fushomas  Register   On: 08/14/2017 07:36   Ct Angio Chest Pe W And/or Wo Contrast  Result Date: 09/08/2017 CLINICAL DATA:  Surgery in early June for treatment of left empyema and decortication, now with increased shortness of breath, chest pain, and painful swelling of the right leg EXAM: CT ANGIOGRAPHY CHEST WITH CONTRAST TECHNIQUE: Multidetector CT imaging of the chest was performed using the standard protocol during bolus administration of intravenous contrast. Multiplanar CT image reconstructions and MIPs were obtained to evaluate the vascular anatomy. CONTRAST:  100mL ISOVUE-370 IOPAMIDOL (ISOVUE-370) INJECTION 76% COMPARISON:  Chest x-ray of 09/08/2017, and CT chest of 08/04/2017 FINDINGS: Cardiovascular: There is good opacification of  the pulmonary arteries. There are occlusive pulmonary emboli to the right lower lobe as seen on axial image 168 series 7. No other definite pulmonary emboli are seen. No central saddle embolus is evident. RV/LV ratio is within upper limits of normal. A moderate amount of epicardial and pericardial fat is present. The mid ascending thoracic aorta is within normal limits in size. No abnormality of the thoracic aorta is seen. Faint calcifications are present within the left anterior descending and  circumflex coronary arteries. Mediastinum/Nodes: No mediastinal or hilar adenopathy is noted. The thyroid gland is unremarkable. No abnormality of the esophagus is seen. Lungs/Pleura: Mild airspace disease is noted within the medial right upper lobe. Otherwise the previously noted airspace disease has largely resolved. A left chest tube remains for treatment of empyema and there is some air within the pleural space. There is volume loss at the left lung base as result of the compression by the loculated fluid. Upper Abdomen: Within the upper abdomen, no abnormality is evident. Musculoskeletal: The thoracic vertebrae are in normal alignment. Review of the MIP images confirms the above findings. IMPRESSION: 1. There are pulmonary emboli which appear occlusive involving two branches of the right pulmonary artery to the right lower lobe. 2. Left chest tube remains with left hydropneumothorax present. 3. Faint airspace disease is noted the in the medial right upper lobe with improvement in airspace disease noted on prior CT. 4. Faint calcification of the left anterior descending and circumflex coronary arteries. Electronically Signed   By: Dwyane Dee M.D.   On: 09/08/2017 14:43   Dg Chest Port 1 View  Result Date: 09/04/2017 CLINICAL DATA:  Left chest tube placement. EXAM: PORTABLE CHEST 1 VIEW COMPARISON:  09/04/2017.  08/31/2017. FINDINGS: Left chest tube in stable position. Stable ex vacuo small left hydropneumothorax is again noted. No acute pulmonary disease. Heart size normal. No acute bony abnormality. IMPRESSION: Left chest tube stable position. Stable ex vacuo small left hydropneumothorax is again noted. Chest is unchanged from prior exam. No new abnormality identified. Electronically Signed   By: Maisie Fus  Register   On: 09/04/2017 14:07   Dg Chest Port 1 View  Result Date: 09/04/2017 CLINICAL DATA:  Post left-sided chest tube placement EXAM: PORTABLE CHEST 1 VIEW COMPARISON:  08/31/2017; 08/14/2017;  08/13/2017 FINDINGS: Grossly unchanged cardiac silhouette and mediastinal contours with persistent partial obscuration of the left heart border. Interval reduction in persistent small loculated left-sided effusion post thoracentesis with development of an ex vacuo loculated left basilar hydropneumothorax, similar to previous examinations. No mediastinal shift. Minimally improved aeration of the left lung base with persistent left basilar heterogeneous/consolidative opacities. No new focal airspace opacities. The right hemithorax remains well aerated. No evidence of edema. No acute osseus abnormalities. IMPRESSION: Interval reduction in persistent small loculated left-sided effusion post thoracentesis with reaccumulation of a ex vacuo left basilar hydropneumothorax, similar to previous examinations. Electronically Signed   By: Simonne Come M.D.   On: 09/04/2017 12:57   Dg Chest Port 1 View  Result Date: 08/13/2017 CLINICAL DATA:  LEFT chest tube removal EXAM: PORTABLE CHEST 1 VIEW COMPARISON:  Portable exam 0851 hours compared to 0721 hours FINDINGS: Interval removal of LEFT thoracostomy tube. Loculated hydropneumothorax at LEFT lung base, pneumothorax component appears slightly larger but this could be due to slight rotation to the RIGHT. Significant atelectasis of the lower LEFT lung again identified. RIGHT lung remains clear. IMPRESSION: Persistent loculated LEFT hydropneumothorax, with pneumothorax component appearing slightly larger post chest tube removal though this could be  related to patient rotation. Electronically Signed   By: Ulyses Southward M.D.   On: 08/13/2017 09:06   Dg Chest Port 1 View  Result Date: 08/13/2017 CLINICAL DATA:  Chest tube EXAM: PORTABLE CHEST 1 VIEW COMPARISON:  08/12/2017 FINDINGS: Left chest tube remains in place, unchanged. Loculated left lateral hydropneumothorax again noted, unchanged. Left basilar airspace opacity and minimal right base atelectasis, stable. Mild vascular  congestion. IMPRESSION: No significant change since prior study. Electronically Signed   By: Charlett Nose M.D.   On: 08/13/2017 07:56   Dg Chest Port 1 View  Result Date: 08/12/2017 CLINICAL DATA:  Left pneumothorax.  Chest tube in place. EXAM: PORTABLE CHEST 1 VIEW COMPARISON:  08/11/2017, 08/10/2017 and 08/09/2017 FINDINGS: Left chest tube is unchanged. Lateral left base pneumothorax with adjacent pleural thickening and adjacent chronic consolidation in the left lower lobe are again noted. Heart size and vascularity are normal. Right lung is clear. No bone abnormality. IMPRESSION: No significant change in the appearance of the chest. Persistent loculated left lateral pneumothorax at the base with chronic pleural thickening and chronic consolidation in the left lower lobe. Electronically Signed   By: Francene Boyers M.D.   On: 08/12/2017 08:55   Dg Chest Port 1 View  Result Date: 08/11/2017 CLINICAL DATA:  57 year old male with history of left-sided chest tube. EXAM: PORTABLE CHEST 1 VIEW COMPARISON:  Chest x-ray 08/10/2017. FINDINGS: Previously noted left-sided chest tube remains stable in position with tip projecting over the lateral aspect of the mid left hemithorax. There continues to be some pleural fluid which appears to be loculated laterally, as well as a trace volume of residual pneumothorax most evident at the left base. Opacity at the left base partially obscuring the medial left hemidiaphragm may reflect atelectasis and/or consolidation. Right lung is clear. No right pleural effusion. No evidence of pulmonary edema. Heart size appears within normal limits. Upper mediastinal contours are normal. IMPRESSION: 1. Stable position of left-sided chest tube. Complex left hydropneumothorax again noted with decreasing pneumothorax component and persistent laterally loculated fluid component. Electronically Signed   By: Trudie Reed M.D.   On: 08/11/2017 09:17   Ct Image Guided Drainage By  Percutaneous Catheter  Result Date: 09/04/2017 INDICATION: History of left-sided chest empyema, post decortication with persistent left-sided pleural effusion. Request made for placement of a non tunneled chest tube for the purposes of infection source control. EXAM: CT IMAGE GUIDED DRAINAGE BY PERCUTANEOUS CATHETER COMPARISON:  Chest CT-08/04/2017; multiple chest radiographs, most recently on 08/31/2017 MEDICATIONS: Ancef 2 g IV; the antibiotics were administered within an appropriate time frame prior to the initiation of the procedure. ANESTHESIA/SEDATION: Moderate (conscious) sedation was employed during this procedure. A total of Versed 3 mg and Fentanyl 100 mcg was administered intravenously. Moderate Sedation Time: 23 minutes. The patient's level of consciousness and vital signs were monitored continuously by radiology nursing throughout the procedure under my direct supervision. CONTRAST:  None COMPLICATIONS: None immediate. PROCEDURE: Informed written consent was obtained from the patient after a discussion of the risks, benefits and alternatives to treatment. The patient was placed supine, slightly RPO on the CT gantry and a pre procedural CT was performed re-demonstrating the known abscess/fluid collection within the left pleural space. The procedure was planned. A timeout was performed prior to the initiation of the procedure. The skin overlying the inferior left lateral aspect the left chest was prepped and draped in the usual sterile fashion. The overlying soft tissues were anesthetized with 1% lidocaine with epinephrine. Appropriate trajectory  was planned with the use of a 22 gauge spinal needle. An 18 gauge trocar needle was advanced into the left-sided pleural effusion and a short Amplatz super stiff wire was coiled within the left pleural space. Appropriate positioning was confirmed with a limited CT scan. The tract was serially dilated allowing placement of a 12 [place] all-purpose drainage catheter.  Appropriate positioning was confirmed with a limited postprocedural CT scan. Approximately 550 cc of blood tinged non foul smelling pleural fluid was aspirated. The tube was connected to a mini express pleural vac device and sutured in place. A dressing was placed. The patient tolerated the procedure well without immediate post procedural complication. IMPRESSION: Successful CT guided placement of a 62 French all purpose drain catheter into the left pleural space with aspiration of 550 cc of blood tinged, non foul smelling pleural fluid. Samples were sent to the laboratory as requested by the ordering clinical team. PLAN: - Patient has outpatient appointment scheduled with Dr. Tyrone Sage for early next week with hope for drain removal at that time. Electronically Signed   By: Simonne Come M.D.   On: 09/04/2017 14:58     CBC Recent Labs  Lab 09/04/17 0919 09/08/17 1214 09/09/17 0538  WBC 9.4 9.1 8.1  HGB 18.2* 17.4* 15.7  HCT 50.9 50.6 46.5  PLT 146* 121* 110*  MCV 103.0* 104.5* 104.3*  MCH 36.8* 36.0* 35.2*  MCHC 35.8 34.4 33.8  RDW 17.6* 18.7* 18.4*  LYMPHSABS 2.0 2.4  --   MONOABS 1.0 0.9  --   EOSABS 0.0 0.1  --   BASOSABS 0.0 0.1  --     Chemistries  Recent Labs  Lab 09/04/17 0919 09/08/17 1214 09/09/17 0538  NA 142 138 137  K 3.2* 3.5 3.7  CL 109 106 104  CO2 20* 19* 25  GLUCOSE 175* 130* 103*  BUN 5* 6 <5*  CREATININE 0.58* 0.89 0.73  CALCIUM 8.2* 8.5* 8.0*  AST 17 24  --   ALT 11 14  --   ALKPHOS 192* 156*  --   BILITOT 1.5* 1.3*  --    ------------------------------------------------------------------------------------------------------------------ estimated creatinine clearance is 137.1 mL/min (by C-G formula based on SCr of 0.73 mg/dL). ------------------------------------------------------------------------------------------------------------------ No results for input(s): HGBA1C in the last 72  hours. ------------------------------------------------------------------------------------------------------------------ No results for input(s): CHOL, HDL, LDLCALC, TRIG, CHOLHDL, LDLDIRECT in the last 72 hours. ------------------------------------------------------------------------------------------------------------------ No results for input(s): TSH, T4TOTAL, T3FREE, THYROIDAB in the last 72 hours.  Invalid input(s): FREET3 ------------------------------------------------------------------------------------------------------------------ Recent Labs    09/09/17 0538  VITAMINB12 202    Coagulation profile Recent Labs  Lab 09/04/17 0919 09/08/17 1214  INR 1.21 1.25    No results for input(s): DDIMER in the last 72 hours.  Cardiac Enzymes No results for input(s): CKMB, TROPONINI, MYOGLOBIN in the last 168 hours.  Invalid input(s): CK ------------------------------------------------------------------------------------------------------------------ Invalid input(s): POCBNP   CBG: No results for input(s): GLUCAP in the last 168 hours.     Studies: Dg Chest 2 View  Result Date: 09/08/2017 CLINICAL DATA:  Follow-up LEFT empyema and loculated LEFT pneumothorax with LEFT pleural drainage catheter in place. EXAM: CHEST - 2 VIEW COMPARISON:  09/04/2017, 08/31/2017 and earlier, including CT chest 08/04/2017. FINDINGS: Cardiomediastinal silhouette unremarkable, unchanged. LEFT LATERAL pneumothorax with associated pleural thickening, unchanged since the examination 4 days ago. Improved aeration in the LEFT lower lobe, with moderate passive atelectasis and/or pneumonia persisting. No new pulmonary parenchymal abnormalities in either lung. No RIGHT pleural effusion. Visualized bony thorax intact. IMPRESSION: 1. Stable loculated LEFT  LATERAL pneumothorax with associated pleural thickening. 2. Improved aeration in the LEFT lower lobe with moderate residual passive atelectasis and/or  pneumonia. 3. No new abnormalities. Electronically Signed   By: Hulan Saas M.D.   On: 09/08/2017 13:08   Ct Angio Chest Pe W And/or Wo Contrast  Result Date: 09/08/2017 CLINICAL DATA:  Surgery in early June for treatment of left empyema and decortication, now with increased shortness of breath, chest pain, and painful swelling of the right leg EXAM: CT ANGIOGRAPHY CHEST WITH CONTRAST TECHNIQUE: Multidetector CT imaging of the chest was performed using the standard protocol during bolus administration of intravenous contrast. Multiplanar CT image reconstructions and MIPs were obtained to evaluate the vascular anatomy. CONTRAST:  ISOVUE-370 IOPAMIDOL (ISOVUE-370) INJECTION 76% COMPARISON:  Chest x-ray of 09/08/2017, and CT chest of 08/04/2017 FINDINGS: Cardiovascular: There is good opacification of the pulmonary arteries. There are occlusive pulmonary emboli to the right lower lobe as seen on axial image 168 series 7. No other definite pulmonary emboli are seen. No central saddle embolus is evident. RV/LV ratio is within upper limits of normal. A moderate amount of epicardial and pericardial fat is present. The mid ascending thoracic aorta is within normal limits in size. No abnormality of the thoracic aorta is seen. Faint calcifications are present within the left anterior descending and circumflex coronary arteries. Mediastinum/Nodes: No mediastinal or hilar adenopathy is noted. The thyroid gland is unremarkable. No abnormality of the esophagus is seen. Lungs/Pleura: Mild airspace disease is noted within the medial right upper lobe. Otherwise the previously noted airspace disease has largely resolved. A left chest tube remains for treatment of empyema and there is some air within the pleural space. There is volume loss at the left lung base as result of the compression by the loculated fluid. Upper Abdomen: Within the upper abdomen, no abnormality is evident. Musculoskeletal: The thoracic vertebrae  are in normal alignment. Review of the MIP images confirms the above findings. IMPRESSION: 1. There are pulmonary emboli which appear occlusive involving two branches of the right pulmonary artery to the right lower lobe. 2. Left chest tube remains with left hydropneumothorax present. 3. Faint airspace disease is noted the in the medial right upper lobe with improvement in airspace disease noted on prior CT. 4. Faint calcification of the left anterior descending and circumflex coronary arteries. Electronically Signed   By: Dwyane Dee M.D.   On: 09/08/2017 14:43      No results found for: HGBA1C Lab Results  Component Value Date   CREATININE 0.73 09/09/2017       Scheduled Meds: . sodium chloride flush  3 mL Intravenous Q12H   Continuous Infusions: . heparin 2,000 Units/hr (09/09/17 0715)     LOS: 0 days    Time spent: >30 MINS    Richarda Overlie  [facility] Pager [phone]. If 7PM-7AM, please contact night-coverage at [email or web address], password [facility] 09/09/2017, 8:19 AM  LOS: 0 days

## 2017-09-10 ENCOUNTER — Inpatient Hospital Stay (HOSPITAL_COMMUNITY): Payer: Self-pay

## 2017-09-10 DIAGNOSIS — J90 Pleural effusion, not elsewhere classified: Secondary | ICD-10-CM

## 2017-09-10 LAB — COMPREHENSIVE METABOLIC PANEL
ALBUMIN: 2.3 g/dL — AB (ref 3.5–5.0)
ALK PHOS: 118 U/L (ref 38–126)
ALT: 10 U/L (ref 0–44)
AST: 15 U/L (ref 15–41)
Anion gap: 12 (ref 5–15)
BILIRUBIN TOTAL: 1.5 mg/dL — AB (ref 0.3–1.2)
BUN: 7 mg/dL (ref 6–20)
CALCIUM: 7.9 mg/dL — AB (ref 8.9–10.3)
CO2: 23 mmol/L (ref 22–32)
CREATININE: 0.73 mg/dL (ref 0.61–1.24)
Chloride: 100 mmol/L (ref 98–111)
GLUCOSE: 107 mg/dL — AB (ref 70–99)
Potassium: 3 mmol/L — ABNORMAL LOW (ref 3.5–5.1)
Sodium: 135 mmol/L (ref 135–145)
TOTAL PROTEIN: 5.5 g/dL — AB (ref 6.5–8.1)

## 2017-09-10 LAB — FOLATE RBC
FOLATE, HEMOLYSATE: 263.5 ng/mL
Folate, RBC: 575 ng/mL (ref >498)
Hematocrit: 45.8 % (ref 37.5–51.0)

## 2017-09-10 LAB — HEPARIN LEVEL (UNFRACTIONATED): Heparin Unfractionated: 0.3 IU/mL (ref 0.30–0.70)

## 2017-09-10 LAB — CBC
HEMATOCRIT: 44.6 % (ref 39.0–52.0)
HEMOGLOBIN: 15.3 g/dL (ref 13.0–17.0)
MCH: 35.7 pg — AB (ref 26.0–34.0)
MCHC: 34.3 g/dL (ref 30.0–36.0)
MCV: 104.2 fL — AB (ref 78.0–100.0)
Platelets: 117 10*3/uL — ABNORMAL LOW (ref 150–400)
RBC: 4.28 MIL/uL (ref 4.22–5.81)
RDW: 18.3 % — ABNORMAL HIGH (ref 11.5–15.5)
WBC: 7.8 10*3/uL (ref 4.0–10.5)

## 2017-09-10 MED ORDER — ZOLPIDEM TARTRATE 5 MG PO TABS
5.0000 mg | ORAL_TABLET | Freq: Every evening | ORAL | Status: DC | PRN
  Administered 2017-09-10 – 2017-09-13 (×4): 5 mg via ORAL
  Filled 2017-09-10 (×4): qty 1

## 2017-09-10 NOTE — Progress Notes (Signed)
[facility] PROGRESS NOTE  Bryce Mendoza UJW:[id number] DOB: 10-18-1960 DOA: 09/08/2017   PCP: Patient, No Pcp Per     Assessment/Plan: Principal Problem:   Pulmonary embolism (HCC) Active Problems:   DVT (deep venous thrombosis) (HCC)   Thrombocytopenia (HCC)   Empyema lung (HCC)  57 y.o. male with medical history significant of empyema status post decortication; who presents with right leg swelling and pain.  Patient had recently been hospitalized from 6/4-6/15, after presenting with shortness of breath found to have a left-sided empyema requiring VATS/decortication on 6/6 by Dr. Tyrone Sage. Patient recently had CT-guided catheter placement in the left pleural space on 09/04/17.came in with right lower extremity pain that shows acute DVT, being followed by CTS to help manage bloody output from left pleural space   Assessment and plan Right leg DVT and pulmonary embolus: Acute.  Patient presented with complaints of difficulty breathing and  right leg swelling.  Found to have DVT on ultrasound of the lower extremities and right-sided pulmonary embolus on CT angiogram of the chest.  Given the recent decortication and continued bloody drainage there was concern for worsening bleeding. - continue telemetry bed - Heparin drip per pharmacy - oxycodone prn pain overnight -  echocardiogram Normal LV function. Very mildly reduced RV function - hemoglobin stable overnight, has bloody output from left pigtail catheter connected to suction discussed with Dr. Tyrone Sage, may be able to change pigtail from mini express to Pleura Vac to suction. Encourage incentive spirometer, mx per CTS  anticipate will need to be changed to NOAC , once okay by CTS   Empyema: Patient status post decortication on 6/6 with recent pigtail catheter placed on 7/5.  Fluid was thought to be exudative after analysis and cultures showed no growth.appreciate CT surgery input     Macrocytic anemia/Thrombocytopenia:   .likely due to B-12 deficiency , will start replacing. RBC folate is pending - Continue to monitor  hemoglobin stable      DVT prophylaxsis heparin drip  Code Status:  Full code    Family Communication: Discussed in detail with the patient, all imaging results, lab results explained to the patient   Disposition Plan:     anticipated discharge several days      Consultants:  Thoracic surgery  Procedures:   none  Antibiotics: Anti-infectives (From admission, onward)   None         HPI/Subjective:  patient  Does have bloody output from left pigtail catheter 400 cc overnight  No cp, no sob    Objective: Vitals:   09/09/17 0836 09/09/17 1518 09/09/17 2319 09/10/17 0807  BP: 128/81 (!) 126/98 134/89 (!) 131/91  Pulse: 66 70 74 72  Resp: 20 (!) 28 20 17   Temp: 98.6 F (37 C) 98.5 F (36.9 C) 98.3 F (36.8 C) 99.4 F (37.4 C)  TempSrc: Oral Oral Oral Oral  SpO2: 100% 99% 98% 97%  Weight:      Height:        Intake/Output Summary (Last 24 hours) at 09/10/2017 1106 Last data filed at 09/10/2017 0900 Gross per 24 hour  Intake 1009.45 ml  Output 845 ml  Net 164.45 ml    Exam:  Examination:  General exam: Appears calm and comfortable  Respiratory system: left pigtail catheter the left pleural space Respiratory effort normal. Cardiovascular system: S1 & S2 heard, RRR. No JVD, murmurs, rubs, gallops or clicks. No pedal edema. Gastrointestinal system: Abdomen is nondistended, soft and nontender. No organomegaly or  masses felt. Normal bowel sounds heard. Central nervous system: Alert and oriented. No focal neurological deficits. Extremities: Symmetric 5 x 5 power. Skin: No rashes, lesions or ulcers Psychiatry: Judgement and insight appear normal. Mood & affect appropriate.     Data Reviewed: I have personally reviewed following labs and imaging studies  Micro Results Recent Results (from the past 240 hour(s))  Aerobic/Anaerobic Culture (surgical/deep  wound)     Status: None   Collection Time: 09/04/17 12:06 PM  Result Value Ref Range Status   Specimen Description   Final    ABSCESS CHEST LEFT Performed at [facility], [address], [place], [place] [address]    Special Requests NONE  Final   Gram Stain   Final    MODERATE WBC PRESENT, PREDOMINANTLY PMN NO ORGANISMS SEEN    Culture   Final    No growth aerobically or anaerobically. Performed at [facility], [address], [place], [place] [address]    Report Status 09/09/2017 FINAL  Final    Radiology Reports Dg Chest 2 View  Result Date: 09/08/2017 CLINICAL DATA:  Follow-up LEFT empyema and loculated LEFT pneumothorax with LEFT pleural drainage catheter in place. EXAM: CHEST - 2 VIEW COMPARISON:  09/04/2017, 08/31/2017 and earlier, including CT chest 08/04/2017. FINDINGS: Cardiomediastinal silhouette unremarkable, unchanged. LEFT LATERAL pneumothorax with associated pleural thickening, unchanged since the examination 4 days ago. Improved aeration in the LEFT lower lobe, with moderate passive atelectasis and/or pneumonia persisting. No new pulmonary parenchymal abnormalities in either lung. No RIGHT pleural effusion. Visualized bony thorax intact. IMPRESSION: 1. Stable loculated LEFT LATERAL pneumothorax with associated pleural thickening. 2. Improved aeration in the LEFT lower lobe with moderate residual passive atelectasis and/or pneumonia. 3. No new abnormalities. Electronically Signed   By: Hulan Saas M.D.   On: 09/08/2017 13:08   Dg Chest 2 View  Result Date: 08/31/2017 CLINICAL DATA:  History of empyema. Patient reports shortness of breath and chest pain. History of smoking. EXAM: CHEST - 2 VIEW COMPARISON:  PA and lateral chest x-ray of August 14, 2017 FINDINGS: The volume of fluid present in the loculated collection on the left has increased while the amount of air has decreased. The fluid collection has a convex border oriented toward the  mediastinum. The right lung is well-expanded and clear. The heart and pulmonary vascularity are normal. IMPRESSION: Increased size of the loculated fluid collection on the left with decreased amount of air within the collection. No mediastinal shift. Clear right lung. Electronically Signed   By: David  [place] M.D.   On: 08/31/2017 12:59   Dg Chest 2 View  Result Date: 08/14/2017 CLINICAL DATA:  Postoperative evaluation.  Prior chest tube removal. EXAM: CHEST - 2 VIEW COMPARISON:  None. FINDINGS: Loculated left base hydropneumothorax again noted, this appears slightly smaller on today's exam. Atelectatic changes left lung base. Heart size stable. No acute bony abnormality. IMPRESSION: Left base hydropneumothorax again noted, this appears slightly smaller on today's exam. Atelectatic changes left base again noted. Electronically Signed   By: Maisie Fus  Register   On: 08/14/2017 07:36   Ct Angio Chest Pe W And/or Wo Contrast  Result Date: 09/08/2017 CLINICAL DATA:  Surgery in early June for treatment of left empyema and decortication, now with increased shortness of breath, chest pain, and painful swelling of the right leg EXAM: CT ANGIOGRAPHY CHEST WITH CONTRAST TECHNIQUE: Multidetector CT imaging of the chest was performed using the standard protocol during bolus administration of intravenous contrast. Multiplanar  CT image reconstructions and MIPs were obtained to evaluate the vascular anatomy. CONTRAST:  ISOVUE-370 IOPAMIDOL (ISOVUE-370) INJECTION 76% COMPARISON:  Chest x-ray of 09/08/2017, and CT chest of 08/04/2017 FINDINGS: Cardiovascular: There is good opacification of the pulmonary arteries. There are occlusive pulmonary emboli to the right lower lobe as seen on axial image 168 series 7. No other definite pulmonary emboli are seen. No central saddle embolus is evident. RV/LV ratio is within upper limits of normal. A moderate amount of epicardial and pericardial fat is present. The mid ascending  thoracic aorta is within normal limits in size. No abnormality of the thoracic aorta is seen. Faint calcifications are present within the left anterior descending and circumflex coronary arteries. Mediastinum/Nodes: No mediastinal or hilar adenopathy is noted. The thyroid gland is unremarkable. No abnormality of the esophagus is seen. Lungs/Pleura: Mild airspace disease is noted within the medial right upper lobe. Otherwise the previously noted airspace disease has largely resolved. A left chest tube remains for treatment of empyema and there is some air within the pleural space. There is volume loss at the left lung base as result of the compression by the loculated fluid. Upper Abdomen: Within the upper abdomen, no abnormality is evident. Musculoskeletal: The thoracic vertebrae are in normal alignment. Review of the MIP images confirms the above findings. IMPRESSION: 1. There are pulmonary emboli which appear occlusive involving two branches of the right pulmonary artery to the right lower lobe. 2. Left chest tube remains with left hydropneumothorax present. 3. Faint airspace disease is noted the in the medial right upper lobe with improvement in airspace disease noted on prior CT. 4. Faint calcification of the left anterior descending and circumflex coronary arteries. Electronically Signed   By: Dwyane Dee M.D.   On: 09/08/2017 14:43   Dg Chest Port 1 View  Result Date: 09/10/2017 CLINICAL DATA:  Pleural effusion EXAM: PORTABLE CHEST 1 VIEW COMPARISON:  09/08/2017 FINDINGS: Pigtail catheter left lung base unchanged. Diffuse pleural thickening in the left lung base with pleural air which is unchanged. Left lower lobe consolidation shows mild progression. Right lung is clear.  Negative for heart failure IMPRESSION: Left pleural thickening and effusion and left pleural air unchanged. Left pigtail chest tube unchanged in position. Progression of left lower lobe atelectasis/infiltrate. Electronically Signed   By:  Marlan Palau M.D.   On: 09/10/2017 07:23   Dg Chest Port 1 View  Result Date: 09/04/2017 CLINICAL DATA:  Left chest tube placement. EXAM: PORTABLE CHEST 1 VIEW COMPARISON:  09/04/2017.  08/31/2017. FINDINGS: Left chest tube in stable position. Stable ex vacuo small left hydropneumothorax is again noted. No acute pulmonary disease. Heart size normal. No acute bony abnormality. IMPRESSION: Left chest tube stable position. Stable ex vacuo small left hydropneumothorax is again noted. Chest is unchanged from prior exam. No new abnormality identified. Electronically Signed   By: Maisie Fus  Register   On: 09/04/2017 14:07   Dg Chest Port 1 View  Result Date: 09/04/2017 CLINICAL DATA:  Post left-sided chest tube placement EXAM: PORTABLE CHEST 1 VIEW COMPARISON:  08/31/2017; 08/14/2017; 08/13/2017 FINDINGS: Grossly unchanged cardiac silhouette and mediastinal contours with persistent partial obscuration of the left heart border. Interval reduction in persistent small loculated left-sided effusion post thoracentesis with development of an ex vacuo loculated left basilar hydropneumothorax, similar to previous examinations. No mediastinal shift. Minimally improved aeration of the left lung base with persistent left basilar heterogeneous/consolidative opacities. No new focal airspace opacities. The right hemithorax remains well aerated. No evidence of  edema. No acute osseus abnormalities. IMPRESSION: Interval reduction in persistent small loculated left-sided effusion post thoracentesis with reaccumulation of a ex vacuo left basilar hydropneumothorax, similar to previous examinations. Electronically Signed   By: Simonne Come M.D.   On: 09/04/2017 12:57   Dg Chest Port 1 View  Result Date: 08/13/2017 CLINICAL DATA:  LEFT chest tube removal EXAM: PORTABLE CHEST 1 VIEW COMPARISON:  Portable exam 0851 hours compared to 0721 hours FINDINGS: Interval removal of LEFT thoracostomy tube. Loculated hydropneumothorax at LEFT lung  base, pneumothorax component appears slightly larger but this could be due to slight rotation to the RIGHT. Significant atelectasis of the lower LEFT lung again identified. RIGHT lung remains clear. IMPRESSION: Persistent loculated LEFT hydropneumothorax, with pneumothorax component appearing slightly larger post chest tube removal though this could be related to patient rotation. Electronically Signed   By: Ulyses Southward M.D.   On: 08/13/2017 09:06   Dg Chest Port 1 View  Result Date: 08/13/2017 CLINICAL DATA:  Chest tube EXAM: PORTABLE CHEST 1 VIEW COMPARISON:  08/12/2017 FINDINGS: Left chest tube remains in place, unchanged. Loculated left lateral hydropneumothorax again noted, unchanged. Left basilar airspace opacity and minimal right base atelectasis, stable. Mild vascular congestion. IMPRESSION: No significant change since prior study. Electronically Signed   By: Charlett Nose M.D.   On: 08/13/2017 07:56   Dg Chest Port 1 View  Result Date: 08/12/2017 CLINICAL DATA:  Left pneumothorax.  Chest tube in place. EXAM: PORTABLE CHEST 1 VIEW COMPARISON:  08/11/2017, 08/10/2017 and 08/09/2017 FINDINGS: Left chest tube is unchanged. Lateral left base pneumothorax with adjacent pleural thickening and adjacent chronic consolidation in the left lower lobe are again noted. Heart size and vascularity are normal. Right lung is clear. No bone abnormality. IMPRESSION: No significant change in the appearance of the chest. Persistent loculated left lateral pneumothorax at the base with chronic pleural thickening and chronic consolidation in the left lower lobe. Electronically Signed   By: Francene Boyers M.D.   On: 08/12/2017 08:55   Ct Image Guided Drainage By Percutaneous Catheter  Result Date: 09/04/2017 INDICATION: History of left-sided chest empyema, post decortication with persistent left-sided pleural effusion. Request made for placement of a non tunneled chest tube for the purposes of infection source control.  EXAM: CT IMAGE GUIDED DRAINAGE BY PERCUTANEOUS CATHETER COMPARISON:  Chest CT-08/04/2017; multiple chest radiographs, most recently on 08/31/2017 MEDICATIONS: Ancef 2 g IV; the antibiotics were administered within an appropriate time frame prior to the initiation of the procedure. ANESTHESIA/SEDATION: Moderate (conscious) sedation was employed during this procedure. A total of Versed 3 mg and Fentanyl 100 mcg was administered intravenously. Moderate Sedation Time: 23 minutes. The patient's level of consciousness and vital signs were monitored continuously by radiology nursing throughout the procedure under my direct supervision. CONTRAST:  None COMPLICATIONS: None immediate. PROCEDURE: Informed written consent was obtained from the patient after a discussion of the risks, benefits and alternatives to treatment. The patient was placed supine, slightly RPO on the CT gantry and a pre procedural CT was performed re-demonstrating the known abscess/fluid collection within the left pleural space. The procedure was planned. A timeout was performed prior to the initiation of the procedure. The skin overlying the inferior left lateral aspect the left chest was prepped and draped in the usual sterile fashion. The overlying soft tissues were anesthetized with 1% lidocaine with epinephrine. Appropriate trajectory was planned with the use of a 22 gauge spinal needle. An 18 gauge trocar needle was advanced into the left-sided pleural  effusion and a short Amplatz super stiff wire was coiled within the left pleural space. Appropriate positioning was confirmed with a limited CT scan. The tract was serially dilated allowing placement of a 12 [place]French all-purpose drainage catheter. Appropriate positioning was confirmed with a limited postprocedural CT scan. Approximately 550 cc of blood tinged non foul smelling pleural fluid was aspirated. The tube was connected to a mini express pleural vac device and sutured in place. A dressing was  placed. The patient tolerated the procedure well without immediate post procedural complication. IMPRESSION: Successful CT guided placement of a 1212 French all purpose drain catheter into the left pleural space with aspiration of 550 cc of blood tinged, non foul smelling pleural fluid. Samples were sent to the laboratory as requested by the ordering clinical team. PLAN: - Patient has outpatient appointment scheduled with Dr. Tyrone SageGerhardt for early next week with hope for drain removal at that time. Electronically Signed   By: Simonne ComeJohn  Watts M.D.   On: 09/04/2017 14:58     CBC Recent Labs  Lab 09/04/17 0919 09/08/17 1214 09/09/17 0538 09/10/17 0358  WBC 9.4 9.1 8.1 7.8  HGB 18.2* 17.4* 15.7 15.3  HCT 50.9 50.6 46.5 44.6  PLT 146* 121* 110* 117*  MCV 103.0* 104.5* 104.3* 104.2*  MCH 36.8* 36.0* 35.2* 35.7*  MCHC 35.8 34.4 33.8 34.3  RDW 17.6* 18.7* 18.4* 18.3*  LYMPHSABS 2.0 2.4  --   --   MONOABS 1.0 0.9  --   --   EOSABS 0.0 0.1  --   --   BASOSABS 0.0 0.1  --   --     Chemistries  Recent Labs  Lab 09/04/17 0919 09/08/17 1214 09/09/17 0538 09/10/17 0358  NA 142 138 137 135  K 3.2* 3.5 3.7 3.0*  CL 109 106 104 100  CO2 20* 19* 25 23  GLUCOSE 175* 130* 103* 107*  BUN 5* 6 <5* 7  CREATININE 0.58* 0.89 0.73 0.73  CALCIUM 8.2* 8.5* 8.0* 7.9*  AST 17 24  --  15  ALT 11 14  --  10  ALKPHOS 192* 156*  --  118  BILITOT 1.5* 1.3*  --  1.5*   ------------------------------------------------------------------------------------------------------------------ estimated creatinine clearance is 137.1 mL/min (by C-G formula based on SCr of 0.73 mg/dL). ------------------------------------------------------------------------------------------------------------------ No results for input(s): HGBA1C in the last 72 hours. ------------------------------------------------------------------------------------------------------------------ No results for input(s): CHOL, HDL, LDLCALC, TRIG, CHOLHDL,  LDLDIRECT in the last 72 hours. ------------------------------------------------------------------------------------------------------------------ No results for input(s): TSH, T4TOTAL, T3FREE, THYROIDAB in the last 72 hours.  Invalid input(s): FREET3 ------------------------------------------------------------------------------------------------------------------ Recent Labs    09/09/17 0538  VITAMINB12 202    Coagulation profile Recent Labs  Lab 09/04/17 0919 09/08/17 1214  INR 1.21 1.25    No results for input(s): DDIMER in the last 72 hours.  Cardiac Enzymes No results for input(s): CKMB, TROPONINI, MYOGLOBIN in the last 168 hours.  Invalid input(s): CK ------------------------------------------------------------------------------------------------------------------ Invalid input(s): POCBNP   CBG: No results for input(s): GLUCAP in the last 168 hours.     Studies: Dg Chest 2 View  Result Date: 09/08/2017 CLINICAL DATA:  Follow-up LEFT empyema and loculated LEFT pneumothorax with LEFT pleural drainage catheter in place. EXAM: CHEST - 2 VIEW COMPARISON:  09/04/2017, 08/31/2017 and earlier, including CT chest 08/04/2017. FINDINGS: Cardiomediastinal silhouette unremarkable, unchanged. LEFT LATERAL pneumothorax with associated pleural thickening, unchanged since the examination 4 days ago. Improved aeration in the LEFT lower lobe, with moderate passive atelectasis and/or pneumonia persisting. No new pulmonary parenchymal abnormalities in either  lung. No RIGHT pleural effusion. Visualized bony thorax intact. IMPRESSION: 1. Stable loculated LEFT LATERAL pneumothorax with associated pleural thickening. 2. Improved aeration in the LEFT lower lobe with moderate residual passive atelectasis and/or pneumonia. 3. No new abnormalities. Electronically Signed   By: Hulan Saas M.D.   On: 09/08/2017 13:08   Ct Angio Chest Pe W And/or Wo Contrast  Result Date: 09/08/2017 CLINICAL  DATA:  Surgery in early June for treatment of left empyema and decortication, now with increased shortness of breath, chest pain, and painful swelling of the right leg EXAM: CT ANGIOGRAPHY CHEST WITH CONTRAST TECHNIQUE: Multidetector CT imaging of the chest was performed using the standard protocol during bolus administration of intravenous contrast. Multiplanar CT image reconstructions and MIPs were obtained to evaluate the vascular anatomy. CONTRAST:  ISOVUE-370 IOPAMIDOL (ISOVUE-370) INJECTION 76% COMPARISON:  Chest x-ray of 09/08/2017, and CT chest of 08/04/2017 FINDINGS: Cardiovascular: There is good opacification of the pulmonary arteries. There are occlusive pulmonary emboli to the right lower lobe as seen on axial image 168 series 7. No other definite pulmonary emboli are seen. No central saddle embolus is evident. RV/LV ratio is within upper limits of normal. A moderate amount of epicardial and pericardial fat is present. The mid ascending thoracic aorta is within normal limits in size. No abnormality of the thoracic aorta is seen. Faint calcifications are present within the left anterior descending and circumflex coronary arteries. Mediastinum/Nodes: No mediastinal or hilar adenopathy is noted. The thyroid gland is unremarkable. No abnormality of the esophagus is seen. Lungs/Pleura: Mild airspace disease is noted within the medial right upper lobe. Otherwise the previously noted airspace disease has largely resolved. A left chest tube remains for treatment of empyema and there is some air within the pleural space. There is volume loss at the left lung base as result of the compression by the loculated fluid. Upper Abdomen: Within the upper abdomen, no abnormality is evident. Musculoskeletal: The thoracic vertebrae are in normal alignment. Review of the MIP images confirms the above findings. IMPRESSION: 1. There are pulmonary emboli which appear occlusive involving two branches of the right pulmonary  artery to the right lower lobe. 2. Left chest tube remains with left hydropneumothorax present. 3. Faint airspace disease is noted the in the medial right upper lobe with improvement in airspace disease noted on prior CT. 4. Faint calcification of the left anterior descending and circumflex coronary arteries. Electronically Signed   By: Dwyane Dee M.D.   On: 09/08/2017 14:43   Dg Chest Port 1 View  Result Date: 09/10/2017 CLINICAL DATA:  Pleural effusion EXAM: PORTABLE CHEST 1 VIEW COMPARISON:  09/08/2017 FINDINGS: Pigtail catheter left lung base unchanged. Diffuse pleural thickening in the left lung base with pleural air which is unchanged. Left lower lobe consolidation shows mild progression. Right lung is clear.  Negative for heart failure IMPRESSION: Left pleural thickening and effusion and left pleural air unchanged. Left pigtail chest tube unchanged in position. Progression of left lower lobe atelectasis/infiltrate. Electronically Signed   By: Marlan Palau M.D.   On: 09/10/2017 07:23      No results found for: HGBA1C Lab Results  Component Value Date   CREATININE 0.73 09/10/2017       Scheduled Meds: . cyanocobalamin  1,000 mcg Subcutaneous Weekly  . sodium chloride flush  3 mL Intravenous Q12H   Continuous Infusions: . heparin 2,200 Units/hr (09/10/17 0837)     LOS: 1 day    Time spent: >30 MINS  Richarda Overlie  [facility] Pager [phone]. If 7PM-7AM, please contact night-coverage at [email or web address], password [facility] 09/10/2017, 11:06 AM  LOS: 1 day

## 2017-09-10 NOTE — Progress Notes (Addendum)
      [address]       [place] [id number]             [phone]           Subjective: Patient states right leg is a little less painful and a little less swollen.  Objective: Vital signs in last 24 hours: Temp:  [98.3 F (36.8 C)-98.6 F (37 C)] 98.3 F (36.8 C) (07/10 2319) Pulse Rate:  [66-74] 74 (07/10 2319) Cardiac Rhythm: Normal sinus rhythm (07/10 2000) Resp:  [20-28] 20 (07/10 2319) BP: (126-134)/(81-98) 134/89 (07/10 2319) SpO2:  [98 %-100 %] 98 % (07/10 2319)     Intake/Output from previous day: 07/10 0701 - 07/11 0700 In: 664.1 [I.V.:664.1] Out: 995 [Urine:525; Chest Tube:470]   Physical Exam:  Cardiovascular: RRR Pulmonary: Clear to auscultation  on the right and diminished left base. Abdomen: Soft, non tender, bowel sounds present. Extremities: ++ RLE and erythema, slightly less than yesterday Wounds: Clean and dry.  No erythema or signs of infection. Pigtail catheter: to a mini express, air leak  Lab Results: CBC: Recent Labs    09/09/17 0538 09/10/17 0358  WBC 8.1 7.8  HGB 15.7 15.3  HCT 46.5 44.6  PLT 110* 117*   BMET:  Recent Labs    09/09/17 0538 09/10/17 0358  NA 137 135  K 3.7 3.0*  CL 104 100  CO2 25 23  GLUCOSE 103* 107*  BUN <5* 7  CREATININE 0.73 0.73  CALCIUM 8.0* 7.9*    PT/INR:  Recent Labs    09/08/17 1214  LABPROT 15.6*  INR 1.25   ABG:  INR: Will add last result for INR, ABG once components are confirmed Will add last 4 CBG results once components are confirmed  Assessment/Plan:  1. CV - SR in the 70's 2.  Pulmonary - Chest tube output 470 cc of bloody fluid. Chest tube is to mini express with air leak. CXR this am shows patient rotated to the right, persistent left pleural thickening and effusion, ? Increase in atelectasis. As discussed with Dr. Tyrone SageGerhardt, may be able to change pigtail from mini express to Pleura Vac to suction. Encourage incentive spirometer 3. DVT RLE, PE-on Heparin  drip.  Bryce M ZimmermanPA-C 09/10/2017,7:44 AM [phone]  Will change to pluuvac with suction on pigtail, leave tube for now  I have seen and examined Bryce Mendoza and agree with the above assessment  and plan.  Delight OvensEdward B Sallye Lunz MD Beeper [phone] Office [phone] 09/10/2017 8:32 AM

## 2017-09-10 NOTE — Progress Notes (Signed)
ANTICOAGULATION CONSULT NOTE  Pharmacy Consult for Heparin Indication: pulmonary embolus and DVT  No Known Allergies  Patient Measurements: Height: 6\' 1"  (185.4 cm) Weight: 248 lb (112.5 kg) IBW/kg (Calculated) : 79.9 Heparin Dosing Weight: 103.7 kg  Vital Signs: Temp: 98.3 F (36.8 C) (07/10 2319) Temp Source: Oral (07/10 2319) BP: 134/89 (07/10 2319) Pulse Rate: 74 (07/10 2319)  Labs: Recent Labs    09/08/17 1214  09/09/17 0538 09/09/17 1512 09/10/17 0358  HGB 17.4*  --  15.7  --  15.3  HCT 50.6  --  46.5  --  44.6  PLT 121*  --  110*  --  117*  LABPROT 15.6*  --   --   --   --   INR 1.25  --   --   --   --   HEPARINUNFRC  --    < > 0.25* 0.29* 0.30  CREATININE 0.89  --  0.73  --  0.73   < > = values in this interval not displayed.    Estimated Creatinine Clearance: 137.1 mL/min (by C-G formula based on SCr of 0.73 mg/dL).   Medical History: Past Medical History:  Diagnosis Date  . Empyema lung [facility](HCC)      Assessment: 57 year old male with history of VATS on 08/04/17 for pleural effusion and empyema with replacement of L-chest tube on 7/5 in place who presented to ED with new pain of the right upper and lower extremity and swelling. On heparin for PE and DVT. Last heparin level was borderline low at 0.3. Hgb stable, plts 117. INR baseline 1.25. No bleeding reported.   Goal of Therapy:  Heparin level 0.3-0.7 units/ml Monitor platelets by anticoagulation protocol: Yes   Plan: Increase heparin gtt to 2,200 units/hr Monitor daily heparin level, CBC, s/s of bleed   Enzo BiNathan Tirrell Buchberger, PharmD, BCPS Clinical Pharmacist Phone number [phone] 09/10/2017 7:58 AM

## 2017-09-11 ENCOUNTER — Inpatient Hospital Stay (HOSPITAL_COMMUNITY): Payer: Self-pay

## 2017-09-11 DIAGNOSIS — I824Y1 Acute embolism and thrombosis of unspecified deep veins of right proximal lower extremity: Secondary | ICD-10-CM

## 2017-09-11 LAB — CBC
HCT: 45.1 % (ref 39.0–52.0)
HEMOGLOBIN: 15.3 g/dL (ref 13.0–17.0)
MCH: 35.3 pg — ABNORMAL HIGH (ref 26.0–34.0)
MCHC: 33.9 g/dL (ref 30.0–36.0)
MCV: 104.2 fL — ABNORMAL HIGH (ref 78.0–100.0)
Platelets: 139 10*3/uL — ABNORMAL LOW (ref 150–400)
RBC: 4.33 MIL/uL (ref 4.22–5.81)
RDW: 18 % — ABNORMAL HIGH (ref 11.5–15.5)
WBC: 7.5 10*3/uL (ref 4.0–10.5)

## 2017-09-11 LAB — HEPARIN LEVEL (UNFRACTIONATED)
HEPARIN UNFRACTIONATED: 0.25 [IU]/mL — AB (ref 0.30–0.70)
HEPARIN UNFRACTIONATED: 0.36 [IU]/mL (ref 0.30–0.70)

## 2017-09-11 NOTE — Progress Notes (Signed)
ANTICOAGULATION CONSULT NOTE  Pharmacy Consult for Heparin Indication: pulmonary embolus and DVT  No Known Allergies  Patient Measurements: Height: 6\' 1"  (185.4 cm) Weight: 248 lb (112.5 kg) IBW/kg (Calculated) : 79.9 Heparin Dosing Weight: 103.7 kg  Vital Signs: Temp: 99.3 F (37.4 C) (07/12 0809) Temp Source: Oral (07/12 0809) BP: 132/86 (07/12 0809) Pulse Rate: 82 (07/12 0809)  Labs: Recent Labs    09/09/17 0538  09/10/17 0358 09/11/17 0334 09/11/17 1156  HGB 15.7  --  15.3 15.3  --   HCT 46.5  45.8  --  44.6 45.1  --   PLT 110*  --  117* 139*  --   HEPARINUNFRC 0.25*   < > 0.30 0.25* 0.36  CREATININE 0.73  --  0.73  --   --    < > = values in this interval not displayed.    Estimated Creatinine Clearance: 137.1 mL/min (by C-G formula based on SCr of 0.73 mg/dL).   Medical History: Past Medical History:  Diagnosis Date  . Empyema lung [facility](HCC)      Assessment: 57 year old male with history of VATS on 08/04/17 for pleural effusion and empyema with replacement of L-chest tube on 7/5 in place who presented to ED with new pain of the right upper and lower extremity and swelling.  On heparin for PE and DVT. Last heparin level therapeutic at 0.36. Hgb stable, plts 139. No bleeding reported.   Goal of Therapy:  Heparin level 0.3-0.7 units/ml Monitor platelets by anticoagulation protocol: Yes   Plan: Continue heparin gtt at 2,400 units/hr Monitor daily heparin level, CBC, s/s of bleed  Enzo BiNathan Pranav Lince, PharmD, BCPS Clinical Pharmacist Phone number [phone] 09/11/2017 12:47 PM

## 2017-09-11 NOTE — Progress Notes (Signed)
ANTICOAGULATION CONSULT NOTE  Pharmacy Consult for Heparin Indication: pulmonary embolus and DVT  No Known Allergies  Patient Measurements: Height: 6\' 1"  (185.4 cm) Weight: 248 lb (112.5 kg) IBW/kg (Calculated) : 79.9 Heparin Dosing Weight: 103.7 kg  Vital Signs: Temp: 98.5 F (36.9 C) (07/12 0014) Temp Source: Oral (07/12 0014) BP: 107/67 (07/12 0014) Pulse Rate: 70 (07/12 0014)  Labs: Recent Labs    09/08/17 1214  09/09/17 0538 09/09/17 1512 09/10/17 0358 09/11/17 0334  HGB 17.4*  --  15.7  --  15.3 15.3  HCT 50.6  --  46.5  45.8  --  44.6 45.1  PLT 121*  --  110*  --  117* 139*  LABPROT 15.6*  --   --   --   --   --   INR 1.25  --   --   --   --   --   HEPARINUNFRC  --    < > 0.25* 0.29* 0.30 0.25*  CREATININE 0.89  --  0.73  --  0.73  --    < > = values in this interval not displayed.    Estimated Creatinine Clearance: 137.1 mL/min (by C-G formula based on SCr of 0.73 mg/dL).   Medical History: Past Medical History:  Diagnosis Date  . Empyema lung [facility](HCC)      Assessment: 57 year old male with history of VATS on 08/04/17 for pleural effusion and empyema with replacement of L-chest tube on 7/5 in place who presented to ED with new pain of the right upper and lower extremity and swelling. On heparin for PE and DVT.   Heparin level this morning is SUBtherapeutic (HL 0.25 << 0.3, goal of 0.3-0.7). Hgb/Hct stable, plts 139. No bleeding or issues with the drip noted per RN.    Goal of Therapy:  Heparin level 0.3-0.7 units/ml Monitor platelets by anticoagulation protocol: Yes   Plan: - Increase Heparin to 2400 units/hr (24 ml/hr) - Will continue to monitor for any signs/symptoms of bleeding and will follow up with heparin level in 6 hours   Thank you for allowing pharmacy to be a part of this patient's care.  Georgina PillionElizabeth Janesia Joswick, PharmD, BCPS Clinical Pharmacist Pager: [phone] If after 3:30p, please call main pharmacy at: [phone] 09/11/2017 4:44 AM

## 2017-09-11 NOTE — Progress Notes (Addendum)
      [address]       [name]             [phone]           Subjective: Patient just waking up. He states his right leg does not hurt at the moment.  Objective: Vital signs in last 24 hours: Temp:  [98.5 F (36.9 C)-99.4 F (37.4 C)] 98.5 F (36.9 C) (07/12 0014) Pulse Rate:  [70-73] 70 (07/12 0014) Cardiac Rhythm: Normal sinus rhythm (07/11 2015) Resp:  [17-22] 22 (07/12 0014) BP: (107-140)/(67-91) 107/67 (07/12 0014) SpO2:  [94 %-98 %] 94 % (07/12 0014)     Intake/Output from previous day: 07/11 0701 - 07/12 0700 In: 1847 [P.O.:1280; I.V.:567] Out: 1775 [Urine:1550; Chest Tube:225]   Physical Exam:  Cardiovascular: RRR Pulmonary: Clear to auscultation  on the right and coarse left base. Abdomen: Soft, non tender, bowel sounds present. Extremities: ++ RLE warm, and erythema (although slightly less than previous) Wounds: Clean and dry.  No erythema or signs of infection. Pigtail catheter: to Pleura Vac, tidling that worsens with cough  Lab Results: CBC: Recent Labs    09/10/17 0358 09/11/17 0334  WBC 7.8 7.5  HGB 15.3 15.3  HCT 44.6 45.1  PLT 117* 139*   BMET:  Recent Labs    09/09/17 0538 09/10/17 0358  NA 137 135  K 3.7 3.0*  CL 104 100  CO2 25 23  GLUCOSE 103* 107*  BUN <5* 7  CREATININE 0.73 0.73  CALCIUM 8.0* 7.9*    PT/INR:  Recent Labs    09/08/17 1214  LABPROT 15.6*  INR 1.25   ABG:  INR: Will add last result for INR, ABG once components are confirmed Will add last 4 CBG results once components are confirmed  Assessment/Plan:  1. CV - SR  2.  Pulmonary - Chest tube output 225 cc of bloody fluid. There is very little in Pleura Vac so most of this was likely in mini express. Chest tube is to to suction. CXR this am appears stable.  Encourage incentive spirometer 3. DVT RLE, PE-on Heparin drip.  Donielle M ZimmermanPA-C 09/11/2017,7:41 AM [phone]  I have seen and examined Bryce Mendoza  and agree with the above assessment  and plan.  Delight OvensEdward B Tameisha Covell MD Beeper [phone] Office [phone] 09/11/2017 5:59 PM

## 2017-09-11 NOTE — Progress Notes (Addendum)
PROGRESS NOTE  Cassie Henkels ZOX:[id number] DOB: 1960/12/10 DOA: 09/08/2017 PCP: Patient, No Pcp Per  Has follow-up at [facility] on 8/8 at 10:30, he will be able to use the pharmacy at the [facility] clinic for medication assistance if discharged Mon-Friday, if dc on weekend will need Match letter for med assistance.     Brief Narrative: 57 year old man status post left VATS, minithoracotomy, empyema drainage, decortication 6/6.  Discharged on Augmentin.  Seen in the office 7/1 noted to have increase in size of loculated left pleural effusion, status post pigtail catheter placement 7/5 by interventional radiology.  Presented 7/9 to the emergency department for right leg pain and shortness of breath, chest pain.  Imaging revealed acute PE, right lower extremity DVT.  Assessment/Plan Acute pulmonary embolism involving 2 branches right pulmonary artery, acute DVT right femoral, popliteal, posterior tibial, gastrocnemius veins.  Echocardiogram showed normal LV function, mildly reduced RV function. --IV heparin.  Transition to NOAC when clear per CT surgery.  Discussed with patient risk/benefit of warfarin versus newer agents.  He elects to begin a newer agent when indicated. --Placed right lower extremity compression stocking --given extensive RLE DVT will ask VVS to see for further recs --discussed PE/echo with mild RV dysfucntion with Dr. Delton Coombes PCCM; given stable hemodynamics, lack of hypoxia, recent surgery, recommends against EKOS; recommends continue with current treatment.  Left-sided empyema, status post pigtail catheter placement 7/5 yielding 550 mL tinged fluid.  Blood cultures no growth.  Pleur-evac per CT surgery.  Status post VATS, mini thoracotomy, drainage of left empyema, decortication 08/06/2017.  Acute thrombocytopenia --Trending back up.  Likely secondary to acute PE.  Follow clinically.  Minimally elevated total bilirubin.  Transaminases, alkaline phosphatase  within normal limits. --Follow clinically.  DVT prophylaxis: heparin infusion Code Status: Full Family Communication: none Disposition Plan: home    Brendia Sacks, MD  [facility] Direct contact: [phone] --Via amion app OR  --[email or web address]; password [email or web address]  7PM-7AM contact night coverage as above 09/11/2017, 8:16 AM  LOS: 2 days   Consultants:  CT surgery  Procedures:  Echo Study Conclusions  - Left ventricle: The cavity size was normal. Wall thickness was   increased in a pattern of moderate LVH. Systolic function was   normal. The estimated ejection fraction was in the range of 50%   to 55%. Wall motion was normal; there were no regional wall   motion abnormalities. Left ventricular diastolic function   parameters were normal. - Aortic root: The aortic root was mildly dilated. - Right ventricle: The cavity size was mildly dilated. Wall   thickness was normal. Systolic function was mildly reduced. - Atrial septum: No defect or patent foramen ovale was identified   by color doppler. - Echo contrast used to better assess LV and RV function.  Impressions:  - Normal LV function. Very mildly reduced RV function determined   with use of echo contrast.  Antimicrobials:    Interval history/Subjective: Minimal chest pain.  Breathing okay.  Still has right leg pain.  Objective: Vitals:  Vitals:   09/11/17 0014 09/11/17 0809  BP: 107/67 132/86  Pulse: 70 82  Resp: (!) 22 18  Temp: 98.5 F (36.9 C) 99.3 F (37.4 C)  SpO2: 94% 96%    Exam:  Constitutional:  . Appears calm and comfortable Eyes:  . pupils and irises appear normal ENMT:  . grossly normal hearing  Respiratory:  . CTA bilaterally, no w/r/r.  . Respiratory effort normal.  Cardiovascular:  . RRR,  no m/r/g . No LLE extremity edema   . 2-3+ right lower extremity edema. . Dorsalis pedis pulse 2+ on the right. Musculoskeletal:  . RLE, LLE   . tone normal . Lateral lower  extremity digits appear normal Skin:  . No rashes, lesions, ulcers Psychiatric:  . Mental status o Mood, affect appropriate  I have personally reviewed the following:   Labs:  CBC unremarkable  Imaging studies:  Chest x-ray showed stable findings left lung base, to my read appears slightly better.  Scheduled Meds: . cyanocobalamin  1,000 mcg Subcutaneous Weekly  . sodium chloride flush  3 mL Intravenous Q12H   Continuous Infusions: . heparin 2,400 Units/hr (09/11/17 0600)    Principal Problem:   Pulmonary embolism (HCC) Active Problems:   DVT (deep venous thrombosis) (HCC)   Thrombocytopenia (HCC)   Empyema lung (HCC)   LOS: 2 days
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
## 2017-09-12 ENCOUNTER — Inpatient Hospital Stay (HOSPITAL_COMMUNITY): Payer: Self-pay

## 2017-09-12 DIAGNOSIS — R0989 Other specified symptoms and signs involving the circulatory and respiratory systems: Secondary | ICD-10-CM

## 2017-09-12 LAB — CBC
HEMATOCRIT: 42.2 % (ref 39.0–52.0)
HEMOGLOBIN: 14.4 g/dL (ref 13.0–17.0)
MCH: 35.5 pg — ABNORMAL HIGH (ref 26.0–34.0)
MCHC: 34.1 g/dL (ref 30.0–36.0)
MCV: 103.9 fL — ABNORMAL HIGH (ref 78.0–100.0)
Platelets: 160 10*3/uL (ref 150–400)
RBC: 4.06 MIL/uL — AB (ref 4.22–5.81)
RDW: 17.9 % — ABNORMAL HIGH (ref 11.5–15.5)
WBC: 6.8 10*3/uL (ref 4.0–10.5)

## 2017-09-12 LAB — HEPARIN LEVEL (UNFRACTIONATED): HEPARIN UNFRACTIONATED: 0.33 [IU]/mL (ref 0.30–0.70)

## 2017-09-12 MED ORDER — APIXABAN 5 MG PO TABS
10.0000 mg | ORAL_TABLET | Freq: Two times a day (BID) | ORAL | Status: DC
  Administered 2017-09-12 – 2017-09-14 (×5): 10 mg via ORAL
  Filled 2017-09-12 (×5): qty 2

## 2017-09-12 MED ORDER — APIXABAN 5 MG PO TABS: 5.0000 mg | ORAL_TABLET | Freq: Two times a day (BID) | ORAL | Status: DC

## 2017-09-12 NOTE — Progress Notes (Signed)
Carotid duplex prelim: Bilateral 1-39% ICA stenosis. Right side is upper end of range with moderate homogenous plaque. Farrel DemarkJill Eunice, RDMS, RVT

## 2017-09-12 NOTE — Progress Notes (Signed)
PROGRESS NOTE  Bryce Mendoza WGN:[id number] DOB: 1960/03/11 DOA: 09/08/2017 PCP: Patient, No Pcp Per  [facility] 10[facility], [facility] if discharged Mon-Friday, if dc on weekend will need Match letter for med assistance.     Brief Narrative: 57 year old man status post left VATS, minithoracotomy, empyema drainage, decortication 6/6.  Discharged on Augmentin.  Seen in the office 7/1 noted to have increase in size of loculated left pleural effusion, status post pigtail catheter placement 7/5 by interventional radiology.  Presented 7/9 to the emergency department for right leg pain and shortness of breath, chest pain.  Imaging revealed acute PE, right lower extremity DVT.  Assessment/Plan Acute pulmonary embolism involving 2 branches right pulmonary artery, acute DVT right femoral, popliteal, posterior tibial, gastrocnemius veins.  Echocardiogram showed normal LV function, mildly reduced RV function. --Remains a stable.  Discussed with surgery, okay to transition to oral anticoagulation.  Start Eliquis. --Order compression stocking on discharge.  Continue TED hose for now. --Appreciate VVS recommendations, follow-up in the office in 4 weeks.  Left-sided empyema, status post pigtail catheter placement 7/5.  Cultures no growth. --Per thoracic surgery.  Possible removal of catheter tomorrow.  Status post VATS, mini thoracotomy, drainage of left empyema, decortication 08/06/2017.  Acute thrombocytopenia --Resolved.  Secondary to acute PE/DVT.  Minimally elevated total bilirubin.  Transaminases, alkaline phosphatase within normal limits. --Follow-up as an outpatient.  DVT prophylaxis: heparin infusion Code Status: Full Family Communication: none Disposition Plan: home    Brendia Sacksaniel Greenlee Ancheta, MD  [facility] Direct contact: [phone] --Via amion app OR  --[email or web address];  password [email or web address]  7PM-7AM contact night coverage as above 09/12/2017, 4:10 PM  LOS: 3 days   Consultants:  CT surgery  Procedures:  Echo Study Conclusions  - Left ventricle: The cavity size was normal. Wall thickness was   increased in a pattern of moderate LVH. Systolic function was   normal. The estimated ejection fraction was in the range of 50%   to 55%. Wall motion was normal; there were no regional wall   motion abnormalities. Left ventricular diastolic function   parameters were normal. - Aortic root: The aortic root was mildly dilated. - Right ventricle: The cavity size was mildly dilated. Wall   thickness was normal. Systolic function was mildly reduced. - Atrial septum: No defect or patent foramen ovale was identified   by color doppler. - Echo contrast used to better assess LV and RV function.  Impressions:  - Normal LV function. Very mildly reduced RV function determined   with use of echo contrast.  Antimicrobials:    Interval history/Subjective: Still has some pain.  But overall feeling okay.  Objective: Vitals:  Vitals:   09/11/17 2355 09/12/17 0812  BP: (!) 97/59 127/85  Pulse: 74 70  Resp: 18 17  Temp: 98.2 F (36.8 C) 99.1 F (37.3 C)  SpO2: 96% 97%    Exam: Constitutional:   . Appears calm and comfortable Respiratory:  . CTA bilaterally, no w/r/r.  . Respiratory effort normal.  Cardiovascular:  . RRR, no m/r/g . Right lower extremity edema noted Psychiatric:  . Mental status o Mood, affect appropriate  I have personally reviewed the following:   Labs:  CBC stable.  Platelets within normal limits.  Imaging studies:  Chest x-ray stable  Scheduled Meds: . apixaban  10 mg Oral BID   Followed by  . [START ON 09/19/2017] apixaban  5 mg Oral BID  . cyanocobalamin  1,000 mcg Subcutaneous Weekly  . sodium chloride flush  3 mL Intravenous Q12H   Continuous Infusions:   Principal Problem:   Pulmonary embolism (HCC) Active  Problems:   DVT (deep venous thrombosis) (HCC)   Thrombocytopenia (HCC)   Empyema lung (HCC)   LOS: 3 days

## 2017-09-12 NOTE — Progress Notes (Signed)
Patient ID: Bryce Mendoza, male   DOB: 05-27-1960, 57 y.o.   MRN: [id number] Currently patient is in the vascular lab having study done. Follow-up chest x-ray reviewed, will place the patient on waterseal with anticipation of removing chest tube in 1 to 2 days, but will plan on removal before discharge home. Delight OvensEdward B Maurie Olesen MD      [address] [place] [id number] Office [phone]   Beeper [phone]

## 2017-09-12 NOTE — Progress Notes (Signed)
ANTICOAGULATION CONSULT NOTE  Pharmacy Consult for Apixaban Indication: pulmonary embolus and DVT  No Known Allergies  Patient Measurements: Height: 6\' 1"  (185.4 cm) Weight: 248 lb (112.5 kg) IBW/kg (Calculated) : 79.9 Heparin Dosing Weight: 103.7 kg  Vital Signs: Temp: 99.1 F (37.3 C) (07/13 0812) Temp Source: Oral (07/13 0812) BP: 127/85 (07/13 0812) Pulse Rate: 70 (07/13 0812)  Labs: Recent Labs    09/10/17 0358 09/11/17 0334 09/11/17 1156 09/12/17 0324  HGB 15.3 15.3  --  14.4  HCT 44.6 45.1  --  42.2  PLT 117* 139*  --  160  HEPARINUNFRC 0.30 0.25* 0.36 0.33  CREATININE 0.73  --   --   --     Estimated Creatinine Clearance: 137.1 mL/min (by C-G formula based on SCr of 0.73 mg/dL).   Medical History: Past Medical History:  Diagnosis Date  . Empyema lung [facility](HCC)      Assessment: 57 year old male with history of VATS on 08/04/17 for pleural effusion and empyema with replacement of L-chest tube on 7/5 in place who presented to ED with new pain of the right upper and lower extremity and swelling.  On heparin for PE and DVT. Heparin level therapeutic at 0.33. Hgb stable, plts 160. No bleeding reported.  Now to start Apixaban.   Goal of Therapy:  Therapeutic Anticoagulation Monitor platelets by anticoagulation protocol: Yes   Plan: Discontinue heparin and associated labs. Apixaban 10mg  PO BID x 7 days, then Apixaban 5mg  PO BID therafter. Monitor for signs and symptoms of bleeding.   Toys 'R' UsKimberly Laquashia Mergenthaler, Pharm.D., BCPS Clinical Pharmacist Pager: [phone] Clinical phone for 09/12/2017 from 8:30-4:00 is [phone].  **Pharmacist phone directory can now be found on [email or web address] (PW [email or web address]).  Listed under [facility].  09/12/2017 3:25 PM

## 2017-09-12 NOTE — Progress Notes (Signed)
ANTICOAGULATION CONSULT NOTE  Pharmacy Consult for Heparin Indication: pulmonary embolus and DVT  No Known Allergies  Patient Measurements: Height: 6\' 1"  (185.4 cm) Weight: 248 lb (112.5 kg) IBW/kg (Calculated) : 79.9 Heparin Dosing Weight: 103.7 kg  Vital Signs: Temp: 99.1 F (37.3 C) (07/13 0812) Temp Source: Oral (07/13 0812) BP: 127/85 (07/13 0812) Pulse Rate: 70 (07/13 0812)  Labs: Recent Labs    09/10/17 0358 09/11/17 0334 09/11/17 1156 09/12/17 0324  HGB 15.3 15.3  --  14.4  HCT 44.6 45.1  --  42.2  PLT 117* 139*  --  160  HEPARINUNFRC 0.30 0.25* 0.36 0.33  CREATININE 0.73  --   --   --     Estimated Creatinine Clearance: 137.1 mL/min (by C-G formula based on SCr of 0.73 mg/dL).   Medical History: Past Medical History:  Diagnosis Date  . Empyema lung [facility](HCC)      Assessment: 57 year old male with history of VATS on 08/04/17 for pleural effusion and empyema with replacement of L-chest tube on 7/5 in place who presented to ED with new pain of the right upper and lower extremity and swelling.  On heparin for PE and DVT. Heparin level therapeutic at 0.33. Hgb stable, plts 160. No bleeding reported.   Goal of Therapy:  Heparin level 0.3-0.7 units/ml Monitor platelets by anticoagulation protocol: Yes   Plan: Continue heparin gtt at 2,400 units/hr Monitor daily heparin level, CBC, s/s of bleed  Gwynneth AlbrightSara Marcelina Mclaurin, VermontPharm D PGY1 Pharmacy Resident  Phone [phone] 09/12/2017   2:26 PM

## 2017-09-12 NOTE — Progress Notes (Signed)
   Carotid duplex reviewed no evidence of significant stenosis He will follow-up in office 4 to 6 weeks with lower extremity venous duplexes.  Sebrina Kessner C. Randie Heinzain, MD [facility] [place] Office: [phone] Pager: [phone]

## 2017-09-13 NOTE — Progress Notes (Signed)
PROGRESS NOTE  Bryce Mendoza ZOX:[id number] DOB: [birth year] DOA: 09/08/2017 PCP: Patient, No Pcp Per  [facility] at 10[facility]30, [facility] if discharged Mon-Friday, if dc on weekend will need Match letter for med assistance.     Brief Narrative: 57 year old man status post left VATS, minithoracotomy, empyema drainage, decortication 6/6.  Discharged on Augmentin.  Seen in the office 7/1 noted to have increase in size of loculated left pleural effusion, status post pigtail catheter placement 7/5 by interventional radiology.  Presented 7/9 to the emergency department for right leg pain and shortness of breath, chest pain.  Imaging revealed acute PE, right lower extremity DVT.  Assessment/Plan Acute pulmonary embolism involving 2 branches right pulmonary artery, acute DVT right femoral, popliteal, posterior tibial, gastrocnemius veins.  Echocardiogram showed normal LV function, mildly reduced RV function. --Stable.  Continue oral anticoagulation with apixaban. --Order compression stocking on discharge.  Continue TED hose for now. --Appreciate VVS recommendations, follow-up in the office in 4 weeks.  Left-sided empyema, status post pigtail catheter placement 7/5.  Cultures no growth. --Per thoracic surgery.  Catheter to be removed today.  May be able to go home tomorrow if cleared by surgery.  Status post VATS, mini thoracotomy, drainage of left empyema, decortication 08/06/2017.  Acute thrombocytopenia --Resolved.  Secondary to acute PE/DVT.  Minimally elevated total bilirubin.  Transaminases, alkaline phosphatase within normal limits. --Follow-up as an outpatient.  Telemetry SR. Change to med-surg level of care.  DVT prophylaxis: Apixaban Code Status: Full Family Communication: none Disposition Plan: home    Brendia Sacksaniel Ichelle Harral, MD  [facility] Direct contact:  [phone] --Via amion app OR  --[email or web address]; password [email or web address]  7PM-7AM contact night coverage as above 09/13/2017, 2:42 PM  LOS: 4 days   Consultants:  CT surgery  Procedures:  Echo Study Conclusions  - Left ventricle: The cavity size was normal. Wall thickness was   increased in a pattern of moderate LVH. Systolic function was   normal. The estimated ejection fraction was in the range of 50%   to 55%. Wall motion was normal; there were no regional wall   motion abnormalities. Left ventricular diastolic function   parameters were normal. - Aortic root: The aortic root was mildly dilated. - Right ventricle: The cavity size was mildly dilated. Wall   thickness was normal. Systolic function was mildly reduced. - Atrial septum: No defect or patent foramen ovale was identified   by color doppler. - Echo contrast used to better assess LV and RV function.  Impressions:  - Normal LV function. Very mildly reduced RV function determined   with use of echo contrast.  Antimicrobials:    Interval history/Subjective: Overall doing well.  Objective: Vitals:  Vitals:   09/12/17 2300 09/13/17 0738  BP: 114/82 136/85  Pulse: 70 65  Resp: 18 18  Temp: 98.9 F (37.2 C) 98 F (36.7 C)  SpO2: 97% 98%    Exam: Constitutional:   . Appears calm and comfortable Respiratory:  . CTA bilaterally, no w/r/r . Respiratory effort normal Cardiovascular:  . RRR, no m/r/g . No change RLE extremity edema   Psychiatric:  . Mental status o Mood, affect appropriate  I have personally reviewed the following:   Data:  Scheduled Meds: . apixaban  10 mg Oral BID   Followed by  . [START ON 09/19/2017] apixaban  5 mg Oral BID  . cyanocobalamin  1,000 mcg  Subcutaneous Weekly  . sodium chloride flush  3 mL Intravenous Q12H   Continuous Infusions:   Principal Problem:   Pulmonary embolism (HCC) Active Problems:   DVT (deep venous thrombosis) (HCC)   Thrombocytopenia (HCC)    Empyema lung (HCC)   LOS: 4 days

## 2017-09-13 NOTE — Progress Notes (Signed)
      [address]       [place] [id number]             [phone]           Subjective: Says legs feel better  Objective: Vital signs in last 24 hours: Temp:  [98 F (36.7 C)-98.9 F (37.2 C)] 98 F (36.7 C) (07/14 0738) Pulse Rate:  [63-70] 65 (07/14 0738) Cardiac Rhythm: Normal sinus rhythm (07/14 0938) Resp:  [18-20] 18 (07/14 0738) BP: (114-136)/(77-85) 136/85 (07/14 0738) SpO2:  [97 %-98 %] 98 % (07/14 0738)     Intake/Output from previous day: 07/13 0701 - 07/14 0700 In: 508.3 [P.O.:240; I.V.:268.3] Out: 1315 [Urine:1275; Chest Tube:40]   Physical Exam:  Cardiovascular: RRR Pulmonary: Clear to auscultation  on the right and coarse left base. Abdomen: Soft, non tender, bowel sounds present. Extremities: right leg much improved, decreased swelling Wounds: Clean and dry.  No erythema or signs of infection. Pigtail catheter: to Pleura Vac, tidling that worsens with cough  Lab Results: CBC: Recent Labs    09/11/17 0334 09/12/17 0324  WBC 7.5 6.8  HGB 15.3 14.4  HCT 45.1 42.2  PLT 139* 160   BMET:  No results for input(s): NA, K, CL, CO2, GLUCOSE, BUN, CREATININE, CALCIUM in the last 72 hours.  PT/INR:  No results for input(s): LABPROT, INR in the last 72 hours. ABG:  INR: Will add last result for INR, ABG once components are confirmed Will add last 4 CBG results once components are confirmed  Assessment/Plan:  1. CV - SR  2.  Pulmonary - Chest tube output 40 ml.  . CXR this am appears stable.  Encourage incentive spirometer 3. DVT RLE, PE-on oral agent now   Plan d/c pigtail today , follow up xray in am poss home tomorrow   Reinaldo Raddledward B GerhardtPA-C 09/13/2017,11:49 AM [phone]    Patient ID: Bryce Mendoza, male   DOB: 02-08-1961, 57 y.o.   MRN: [id number]

## 2017-09-13 NOTE — Progress Notes (Signed)
Pt R chest tube removed per order. Pt tolerated well. Pt educated to remain on bedrest for 1 hour. Bre,RN at bedside and aware.  Terance IceMegan Kennidy Lamke,RN,BSN

## 2017-09-14 ENCOUNTER — Telehealth: Payer: Self-pay | Admitting: Vascular Surgery

## 2017-09-14 ENCOUNTER — Inpatient Hospital Stay (HOSPITAL_COMMUNITY): Payer: Self-pay

## 2017-09-14 DIAGNOSIS — I2699 Other pulmonary embolism without acute cor pulmonale: Secondary | ICD-10-CM

## 2017-09-14 DIAGNOSIS — I82401 Acute embolism and thrombosis of unspecified deep veins of right lower extremity: Secondary | ICD-10-CM

## 2017-09-14 DIAGNOSIS — D696 Thrombocytopenia, unspecified: Secondary | ICD-10-CM

## 2017-09-14 LAB — BASIC METABOLIC PANEL
ANION GAP: 9 (ref 5–15)
BUN: 8 mg/dL (ref 6–20)
CO2: 25 mmol/L (ref 22–32)
Calcium: 8.5 mg/dL — ABNORMAL LOW (ref 8.9–10.3)
Chloride: 104 mmol/L (ref 98–111)
Creatinine, Ser: 0.73 mg/dL (ref 0.61–1.24)
GFR calc Af Amer: >60 mL/min (ref >60)
Glucose, Bld: 91 mg/dL (ref 70–99)
POTASSIUM: 3.6 mmol/L (ref 3.5–5.1)
SODIUM: 138 mmol/L (ref 135–145)

## 2017-09-14 MED ORDER — APIXABAN 5 MG PO TABS: ORAL_TABLET | ORAL | 0 refills | Status: DC

## 2017-09-14 MED ORDER — NONFORMULARY OR COMPOUNDED ITEM: 0 refills | Status: DC

## 2017-09-14 NOTE — Discharge Summary (Signed)
Physician Discharge Summary  Bryce Mendoza ZOX:[id number] DOB: Aug 03, 1960 DOA: 09/08/2017  PCP: Loletta Specter, PA-C Has follow-up at [facility] on 8/8 at 10:30, he will be able to use the pharmacy at the [facility] clinic for medication assistance   Admit date: 09/08/2017 Discharge date: 09/14/2017  Recommendations for Outpatient Follow-up:   Acute pulmonary embolism involving 2 branches right pulmonary artery, acute DVT right femoral, popliteal, posterior tibial, gastrocnemius veins.   --Stable.  Continue oral anticoagulation with apixaban. --Appreciate VVS recommendations, follow-up in the office in 4 weeks.  Minimally elevated total bilirubin.  Transaminases, alkaline phosphatase within normal limits. --Follow-up as an outpatient.  Follow-up Information    [facility] Follow up on 10/08/2017.   Why:  10:30 for hospital follow up Contact information: [place] [address] [phone]       [facility] Follow up.   Why:  use pharmacy for medication assistance Contact information: [address] [place] [address] [phone]       Maeola Harman, MD In 4 weeks.   Specialties:  Vascular Surgery, Cardiology Why:  Office will call you to arrange your appt (sent) Contact information: [address] [place] [address] [phone]        [facility] - Dme Follow up.   Why:  cane Contact information: [address] [place] [address] [phone]            Discharge Diagnoses:  1. Acute pulmonary embolism involving 2 branches right pulmonary artery, acute DVT right femoral, popliteal, posterior tibial, gastrocnemius veins 2. Left-sided empyema 3. Status post VATS, mini thoracotomy, drainage of left empyema, decortication 4. Acute thrombocytopenia 5. Minimally elevated total  bilirubin  Discharge Condition: improved Disposition: home  Diet recommendation: regular  Filed Weights   09/08/17 1151  Weight: 112.5 kg (248 lb)    History of present illness:  57 year old man status post left VATS, minithoracotomy, empyema drainage, decortication 6/6.  Discharged on Augmentin.  Seen in the office 7/1 noted to have increase in size of loculated left pleural effusion, status post pigtail catheter placement 7/5 by interventional radiology. Presented 7/9 to the emergency department for right leg pain and shortness of breath, chest pain.  Imaging revealed acute PE, right lower extremity DVT.  Hospital Course:  Patient was treated with heparin, seen by CT surgery and vascular surgery.  Vascular surgery recommended compression hose and outpatient follow-up.  Transition to oral anticoagulation.  Discussed risk/benefit of vitamin K antagonist versus newer agents.  Patient elected newer agent.  CT surgery managed pigtail catheter which was removed 7/14.  He was clear for discharge 7/15 by CT surgery.    Acute pulmonary embolism involving 2 branches right pulmonary artery, acute DVT right femoral, popliteal, posterior tibial, gastrocnemius veins.  Echocardiogram showed normal LV function, mildly reduced RV function. --Stable.  Continue oral anticoagulation with apixaban. --Order compression stocking on discharge.  Continue TED hose for now. --Appreciate VVS recommendations, follow-up in the office in 4 weeks.  Left-sided empyema, status post pigtail catheter placement 7/5.  Cultures no growth. --Catheter removed.  Cleared for discharge by surgery.  Status post VATS, mini thoracotomy, drainage of left empyema, decortication 08/06/2017.  Acute thrombocytopenia --Resolved.  Secondary to acute PE/DVT.  Minimally elevated total bilirubin.  Transaminases, alkaline phosphatase within normal limits. --Follow-up as an outpatient.   Consultants:  CT  surgery  Procedures:  Echo Study Conclusions  -  Left ventricle: The cavity size was normal. Wall thickness was increased in a pattern of moderate LVH. Systolic function was normal. The estimated ejection fraction was in the range of 50% to 55%. Wall motion was normal; there were no regional wall motion abnormalities. Left ventricular diastolic function parameters were normal. - Aortic root: The aortic root was mildly dilated. - Right ventricle: The cavity size was mildly dilated. Wall thickness was normal. Systolic function was mildly reduced. - Atrial septum: No defect or patent foramen ovale was identified by color doppler. - Echo contrast used to better assess LV and RV function.  Impressions:  - Normal LV function. Very mildly reduced RV function determined with use of echo contrast.  Today's assessment: S: Feels better, breathing fine. O: Vitals:  Vitals:   09/13/17 2300 09/14/17 0753  BP: 127/82 120/80  Pulse: 73 75  Resp: 20 17  Temp: 98.5 F (36.9 C) 98.3 F (36.8 C)  SpO2: 96% 99%    Constitutional:  . Appears calm and comfortable Respiratory:  . CTA bilaterally, no w/r/r.  . Respiratory effort normal.  Cardiovascular:  . RRR, no m/r/g . No change RLE extremity edema   Psychiatric:  . Mental status o Mood, affect appropriate o Orientation to person, place, time   BMP unremarkable.  Chest x-ray per CT surgery.  Discharge Instructions  Discharge Instructions    Compression stockings   Complete by:  As directed    20-30 mm Hg BLE   Diet general   Complete by:  As directed    Discharge instructions   Complete by:  As directed    Call your physician or seek immediate medical attention for swelling, pain, shortness of breath or worsening of condition.   Increase activity slowly   Complete by:  As directed      Allergies as of 09/14/2017   No Known Allergies     Medication List    STOP taking these medications    ibuprofen 200 MG tablet Commonly known as:  ADVIL,MOTRIN     TAKE these medications   acetaminophen 500 MG tablet Commonly known as:  TYLENOL Take 1,000 mg by mouth every 6 (six) hours.   apixaban 5 MG Tabs tablet Commonly known as:  ELIQUIS Take 2 tablets (10 mg total) by mouth 2 (two) times daily for 5 days, THEN 1 tablet (5 mg total) 2 (two) times daily. Start taking on:  09/19/2017   NONFORMULARY OR COMPOUNDED ITEM Graduated compression hose 20-30 mm Hg thigh high. Apply to right leg daily.            Durable Medical Equipment  (From admission, onward)        Start     Ordered   09/14/17 1009  For home use only DME Cane  Once     09/14/17 1008     No Known Allergies  The results of significant diagnostics from this hospitalization (including imaging, microbiology, ancillary and laboratory) are listed below for reference.    Significant Diagnostic Studies: Dg Chest 2 View  Result Date: 09/14/2017 CLINICAL DATA:  57 year old male with a history of chest tube removal EXAM: CHEST - 2 VIEW COMPARISON:  09/12/2017, CT 09/08/2017 FINDINGS: Cardiomediastinal silhouette unchanged in size and contour. Borders are partially obscured by overlying lung/pleural disease. Persistent opacity at the left lung base obscuring the left hemidiaphragm and the left heart border. Pleuroparenchymal thickening/opacity on the frontal view with meniscus on the lateral view. Right lung is aerated with no confluent airspace  disease. No pneumothorax. Interval removal of left thoracostomy tube. IMPRESSION: Interval removal of left thoracostomy tube with persistent fluid/consolidation at the left lung base. No evidence of pneumothorax. Electronically Signed   By: Gilmer MorJaime  Wagner D.O.   On: 09/14/2017 07:46   Dg Chest 2 View  Result Date: 09/08/2017 CLINICAL DATA:  Follow-up LEFT empyema and loculated LEFT pneumothorax with LEFT pleural drainage catheter in place. EXAM: CHEST - 2 VIEW COMPARISON:   09/04/2017, 08/31/2017 and earlier, including CT chest 08/04/2017. FINDINGS: Cardiomediastinal silhouette unremarkable, unchanged. LEFT LATERAL pneumothorax with associated pleural thickening, unchanged since the examination 4 days ago. Improved aeration in the LEFT lower lobe, with moderate passive atelectasis and/or pneumonia persisting. No new pulmonary parenchymal abnormalities in either lung. No RIGHT pleural effusion. Visualized bony thorax intact. IMPRESSION: 1. Stable loculated LEFT LATERAL pneumothorax with associated pleural thickening. 2. Improved aeration in the LEFT lower lobe with moderate residual passive atelectasis and/or pneumonia. 3. No new abnormalities. Electronically Signed   By: Hulan Saashomas  Lawrence M.D.   On: 09/08/2017 13:08   Ct Angio Chest Pe W And/or Wo Contrast  Result Date: 09/08/2017 CLINICAL DATA:  Surgery in early June for treatment of left empyema and decortication, now with increased shortness of breath, chest pain, and painful swelling of the right leg EXAM: CT ANGIOGRAPHY CHEST WITH CONTRAST TECHNIQUE: Multidetector CT imaging of the chest was performed using the standard protocol during bolus administration of intravenous contrast. Multiplanar CT image reconstructions and MIPs were obtained to evaluate the vascular anatomy. CONTRAST:  100mL ISOVUE-370 IOPAMIDOL (ISOVUE-370) INJECTION 76% COMPARISON:  Chest x-ray of 09/08/2017, and CT chest of 08/04/2017 FINDINGS: Cardiovascular: There is good opacification of the pulmonary arteries. There are occlusive pulmonary emboli to the right lower lobe as seen on axial image 168 series 7. No other definite pulmonary emboli are seen. No central saddle embolus is evident. RV/LV ratio is within upper limits of normal. A moderate amount of epicardial and pericardial fat is present. The mid ascending thoracic aorta is within normal limits in size. No abnormality of the thoracic aorta is seen. Faint calcifications are present within the left  anterior descending and circumflex coronary arteries. Mediastinum/Nodes: No mediastinal or hilar adenopathy is noted. The thyroid gland is unremarkable. No abnormality of the esophagus is seen. Lungs/Pleura: Mild airspace disease is noted within the medial right upper lobe. Otherwise the previously noted airspace disease has largely resolved. A left chest tube remains for treatment of empyema and there is some air within the pleural space. There is volume loss at the left lung base as result of the compression by the loculated fluid. Upper Abdomen: Within the upper abdomen, no abnormality is evident. Musculoskeletal: The thoracic vertebrae are in normal alignment. Review of the MIP images confirms the above findings. IMPRESSION: 1. There are pulmonary emboli which appear occlusive involving two branches of the right pulmonary artery to the right lower lobe. 2. Left chest tube remains with left hydropneumothorax present. 3. Faint airspace disease is noted the in the medial right upper lobe with improvement in airspace disease noted on prior CT. 4. Faint calcification of the left anterior descending and circumflex coronary arteries. Electronically Signed   By: Dwyane DeePaul  Barry M.D.   On: 09/08/2017 14:43   Dg Chest Port 1 View  Result Date: 09/12/2017 CLINICAL DATA:  Followup pleural effusion. EXAM: PORTABLE CHEST 1 VIEW COMPARISON:  09/11/2017 FINDINGS: There is a left-sided percutaneous pigtail drainage catheter. In loculated hydropneumothorax is unchanged in volume compared with previous exam with overlying  passive atelectasis. No right pleural effusion. No pulmonary edema. IMPRESSION: 1. Stable loculated appearing left-sided hydropneumothorax. Electronically Signed   By: Signa Kell M.D.   On: 09/12/2017 08:17   Dg Chest Port 1 View  Result Date: 09/11/2017 CLINICAL DATA:  Pleural effusion. EXAM: PORTABLE CHEST 1 VIEW COMPARISON:  Radiograph of September 10, 2017. FINDINGS: Stable cardiomediastinal silhouette.  Right lung is clear. Stable position of left pleural pigtail drainage catheter is noted with continued presence of air-filled loculated left pleural effusion. Pleural thickening of adjacent left lower lobe is noted and stable. Bony thorax is unremarkable. IMPRESSION: Stable findings seen in left lung base including pigtail pleural drainage catheter and air-filled loculated left pleural effusion with adjacent pleural thickening of left lower lobe. Electronically Signed   By: Lupita Raider, M.D.   On: 09/11/2017 08:41   Dg Chest Port 1 View  Result Date: 09/10/2017 CLINICAL DATA:  Pleural effusion EXAM: PORTABLE CHEST 1 VIEW COMPARISON:  09/08/2017 FINDINGS: Pigtail catheter left lung base unchanged. Diffuse pleural thickening in the left lung base with pleural air which is unchanged. Left lower lobe consolidation shows mild progression. Right lung is clear.  Negative for heart failure IMPRESSION: Left pleural thickening and effusion and left pleural air unchanged. Left pigtail chest tube unchanged in position. Progression of left lower lobe atelectasis/infiltrate. Electronically Signed   By: Marlan Palau M.D.   On: 09/10/2017 07:23   Labs: Basic Metabolic Panel: Recent Labs  Lab 09/08/17 1214 09/09/17 0538 09/10/17 0358 09/14/17 0257  NA 138 137 135 138  K 3.5 3.7 3.0* 3.6  CL 106 104 100 104  CO2 19* 25 23 25   GLUCOSE 130* 103* 107* 91  BUN 6 <5* 7 8  CREATININE 0.89 0.73 0.73 0.73  CALCIUM 8.5* 8.0* 7.9* 8.5*   Liver Function Tests: Recent Labs  Lab 09/08/17 1214 09/10/17 0358  AST 24 15  ALT 14 10  ALKPHOS 156* 118  BILITOT 1.3* 1.5*  PROT 6.1* 5.5*  ALBUMIN 2.8* 2.3*   CBC: Recent Labs  Lab 09/08/17 1214 09/09/17 0538 09/10/17 0358 09/11/17 0334 09/12/17 0324  WBC 9.1 8.1 7.8 7.5 6.8  NEUTROABS 5.6  --   --   --   --   HGB 17.4* 15.7 15.3 15.3 14.4  HCT 50.6 46.5  45.8 44.6 45.1 42.2  MCV 104.5* 104.3* 104.2* 104.2* 103.9*  PLT 121* 110* 117* 139* 160     Principal Problem:   Pulmonary embolism (HCC) Active Problems:   DVT (deep venous thrombosis) (HCC)   Thrombocytopenia (HCC)   Empyema lung (HCC)   Time coordinating discharge: 25 minutes  Signed:  Brendia Sacks, MD [facility] 09/14/2017, 1:44 PM

## 2017-09-14 NOTE — Discharge Instructions (Addendum)
Deep Vein Thrombosis Deep vein thrombosis (DVT) is a condition in which a blood clot forms in a deep vein, such as a lower leg, thigh, or arm vein. A clot is blood that has thickened into a gel or solid. This condition is dangerous. It can lead to serious and even life-threatening complications if the clot travels to the lungs and causes a blockage (pulmonary embolism). It can also damage veins in the leg. This can result in leg pain, swelling, discoloration, and sores (post-thrombotic syndrome). What are the causes? This condition may be caused by:  A slowdown of blood flow.  Damage to a vein.  A condition that makes blood clot more easily.  What increases the risk? The following factors may make you more likely to develop this condition:  Being overweight.  Being elderly, especially over age 57.  Sitting or lying down for more than four hours.  Lack of physical activity (sedentary lifestyle).  Being pregnant, giving birth, or having recently given birth.  Taking medicines that contain estrogen.  Smoking.  A history of any of the following: ? Blood clots or blood clotting disease. ? Peripheral vascular disease. ? Inflammatory bowel disease. ? Cancer. ? Heart disease. ? Genetic conditions that affect how blood clots. ? Neurological diseases that affect the legs (leg paresis). ? Injury. ? Major or lengthy surgery. ? A central line placed inside a large vein.  What are the signs or symptoms? Symptoms of this condition include:  Swelling, pain, or tenderness in an arm or leg.  Warmth, redness, or discoloration in an arm or leg.  If the clot is in your leg, symptoms may be more noticeable or worse when you stand or walk. Some people do not have any symptoms. How is this diagnosed? This condition is diagnosed with:  A medical history.  A physical exam.  Tests, such as: ? Blood tests. These are done to see how your blood clots. ? Imaging tests. These are done to  check for clots. Tests may include:  Ultrasound.  CT scan.  MRI.  X-ray.  Venogram. For this test, X-rays are taken after a dye is injected into a vein.  How is this treated? Treatment for this condition depends on the cause, your risk for bleeding or developing more clots, and any medical conditions you have. Treatment may include:  Taking blood thinners (also called anticoagulants). These medicines may be taken by mouth, injected under the skin, or injected through an IV tube (catheter). These medicines prevent clots from forming.  Injecting medicine that dissolves blood clots into the affected vein (catheter-directed thrombolysis).  Having surgery. Surgery may be done to: ? Remove the clot. ? Place a filter in a large vein to catch blood clots before they reach the lungs.  Some treatments may be continued for up to six months. Follow these instructions at home: If you are taking an oral blood thinner:  Take the medicine exactly as told by your health care provider. Some blood thinners need to be taken at the same time every day. Do not skip a dose.  Ask your health care provider about what foods and drugs interact with the medicine.  Ask about possible side effects. General instructions  Blood thinners can cause easy bruising and difficulty stopping bleeding. Because of this, if you are taking or were given a blood thinner: ? Hold pressure over cuts for longer than usual. ? Tell your dentist and other health care providers that you are taking blood thinners  before having any procedures that can cause bleeding. ? Avoid contact sports.  Take over-the-counter and prescription medicines only as told by your health care provider.  Return to your normal activities as told by your health care provider. Ask your health care provider what activities are safe for you.  Wear compression stockings if recommended by your health care provider.  Keep all follow-up visits as told by  your health care provider. This is important. How is this prevented? To lower your risk of developing this condition again:  For 30 or more minutes every day, do an activity that: ? Involves moving your arms and legs. ? Increases your heart rate.  When traveling for longer than four hours: ? Exercise your arms and legs every hour. ? Drink plenty of water. ? Avoid drinking alcohol.  Avoid sitting or lying for a long time without moving your legs.  Stay a healthy weight.  If you are a woman who is older than age 39, avoid unnecessary use of medicines that contain estrogen.  Do not use any products that contain nicotine or tobacco, such as cigarettes and e-cigarettes. This is especially important if you take estrogen medicines. If you need help quitting, ask your health care provider.  Contact a health care provider if:  You miss a dose of your blood thinner.  You have nausea, vomiting, or diarrhea that lasts for more than one day.  Your menstrual period is heavier than usual.  You have unusual bruising. Get help right away if:  You have new or increased pain, swelling, or redness in an arm or leg.  You have numbness or tingling in an arm or leg.  You have shortness of breath.  You have chest pain.  You have a rapid or irregular heartbeat.  You feel light-headed or dizzy.  You cough up blood.  There is blood in your vomit, stool, or urine.  You have a serious fall or accident, or you hit your head.  You have a severe headache or confusion.  You have a cut that will not stop bleeding. These symptoms may represent a serious problem that is an emergency. Do not wait to see if the symptoms will go away. Get medical help right away. Call your local emergency services (911 in the [place] Do not drive yourself to the hospital. Summary  DVT is a condition in which a blood clot forms in a deep vein, such as a lower leg, thigh, or arm vein.  Symptoms can include swelling,  warmth, pain, and redness in your leg or arm.  Treatment may include taking blood thinners, injecting medicine that dissolves blood clots,wearing compression stockings, or surgery.  If you are prescribed blood thinners, take them exactly as told. This information is not intended to replace advice given to you by your health care provider. Make sure you discuss any questions you have with your health care provider. Document Released: 02/17/2005 Document Revised: 03/22/2016 Document Reviewed: 03/22/2016 Elsevier Interactive Patient Education  2018 [employer]. Information on my medicine - ELIQUIS (apixaban)  This medication education was reviewed with me or my healthcare representative as part of my discharge preparation.  The pharmacist that spoke with me during my hospital stay was:  Barton Fanny Nimer, RPH  Why was Eliquis prescribed for you? Eliquis was prescribed to treat blood clots that may have been found in the veins of your legs (deep vein thrombosis) or in your lungs (pulmonary embolism) and to reduce the risk of them occurring again.  What do You need to know about Eliquis ? The starting dose is 10 mg (two 5 mg tablets) taken TWICE daily for the FIRST SEVEN (7) DAYS, then on (enter date) 09/19/2017  the dose is reduced to ONE 5 mg tablet taken TWICE daily.  Eliquis may be taken with or without food.   Try to take the dose about the same time in the morning and in the evening. If you have difficulty swallowing the tablet whole please discuss with your pharmacist how to take the medication safely.  Take Eliquis exactly as prescribed and DO NOT stop taking Eliquis without talking to the doctor who prescribed the medication.  Stopping may increase your risk of developing a new blood clot.  Refill your prescription before you run out.  After discharge, you should have regular check-up appointments with your healthcare provider that is prescribing your Eliquis.    What do you do if  you miss a dose? If a dose of ELIQUIS is not taken at the scheduled time, take it as soon as possible on the same day and twice-daily administration should be resumed. The dose should not be doubled to make up for a missed dose.  Important Safety Information A possible side effect of Eliquis is bleeding. You should call your healthcare provider right away if you experience any of the following: ? Bleeding from an injury or your nose that does not stop. ? Unusual colored urine (red or dark brown) or unusual colored stools (red or black). ? Unusual bruising for unknown reasons. ? A serious fall or if you hit your head (even if there is no bleeding).  Some medicines may interact with Eliquis and might increase your risk of bleeding or clotting while on Eliquis. To help avoid this, consult your healthcare provider or pharmacist prior to using any new prescription or non-prescription medications, including herbals, vitamins, non-steroidal anti-inflammatory drugs (NSAIDs) and supplements.  This website has more information on Eliquis (apixaban): http://www.eliquis.com/eliquis/home

## 2017-09-14 NOTE — Telephone Encounter (Signed)
sch appt lvm 10/16/17 9am LE Venous 1015am f/u MD

## 2017-09-14 NOTE — Progress Notes (Signed)
Pt given discharge instructions, and belongings returned. Pt stable upon discharge, and given cane to take home for ambulation. Pt had no further questions.  Blane OharaSavannah Kriya Westra RN

## 2017-09-14 NOTE — Care Management Note (Signed)
Case Management Note  Patient Details  Name: Bryce Mendoza MRN: [id number] Date of Birth: 12-13-1960  Subjective/Objective:    From home alone, presents with PE, RLE DVT, on heparin drip, has chest tube for loculated pl effusion on 7/5.  He has no PCP, or insurance listed.  [facility] at 10:30, [facility] if discharged Mon-Friday, if dc on weekend will need Match letter for med assistance.  7/13 Letha Capeeborah Damaya Channing RN, BSN - patient is not eligible for the Match,he has already used the Match letter.  He will need generic meds at dc.    7/15 Letha Capeeborah Robertson Colclough RN, BSN - he will be on eliquis, NCM gave him the 30 day savings card and the patient ast application that he needs to take to his follow up apt, also gave him brochures with address and phone information to follow up apt and the [facility] pharmacy.  He also wants a can, [facility], he will bring to patient's rooom.   Patient will need printed scripts                   Action/Plan: DC home when ready.  Expected Discharge Date:                  Expected Discharge Plan:  Home/Self Care  In-House Referral:     Discharge planning Services  CM Consult, Indigent Health Clinic, Follow-up appt scheduled, Medication Assistance  Post Acute Care Choice:  Durable Medical Equipment Choice offered to:     DME Arranged:  [facility]ane DME Agency:  [facility]  [facility]     Status of Service:  Completed, signed off  If discussed at Long Length of Stay Meetings, dates discussed:    Additional Comments:  [name], Jamori Biggar Clinton, RN 09/14/2017, 12:22 PM

## 2017-09-14 NOTE — Progress Notes (Addendum)
      [address]       [place] [id number]             [phone]           Subjective: Patient states right leg hurts more when walking.  Objective: Vital signs in last 24 hours: Temp:  [98.2 F (36.8 C)-98.5 F (36.9 C)] 98.3 F (36.8 C) (07/15 0753) Pulse Rate:  [73-78] 75 (07/15 0753) Cardiac Rhythm: Normal sinus rhythm (07/14 2035) Resp:  [17-20] 17 (07/15 0753) BP: (118-127)/(68-82) 120/80 (07/15 0753) SpO2:  [96 %-99 %] 99 % (07/15 0753)     Intake/Output from previous day: 07/14 0701 - 07/15 0700 In: -  Out: 280 [Urine:200; Chest Tube:80]   Physical Exam:  Cardiovascular: RRR Pulmonary: Clear to auscultation  on the right and diminished left base. Abdomen: Soft, non tender, bowel sounds present. Extremities: Ted hose in place RLE,swelling present Wounds: Clean and dry.  No erythema or signs of infection.   Lab Results: CBC: Recent Labs    09/12/17 0324  WBC 6.8  HGB 14.4  HCT 42.2  PLT 160   BMET:  Recent Labs    09/14/17 0257  NA 138  K 3.6  CL 104  CO2 25  GLUCOSE 91  BUN 8  CREATININE 0.73  CALCIUM 8.5*    PT/INR:  No results for input(s): LABPROT, INR in the last 72 hours. ABG:  INR: Will add last result for INR, ABG once components are confirmed Will add last 4 CBG results once components are confirmed  Assessment/Plan:  1. CV - SR  2.  Pulmonary - Chest tube removed yesterday. CXR this am appears stable this am-no pneumothorax, persistent left base consolidation.  Encourage incentive spirometer 3. DVT RLE, PE-on  Apixaban 10 mg bid 4. Management per medicine  Donielle M ZimmermanPA-C 09/14/2017,8:14 AM [phone]  Tube out yesterday Home soon, ok from thoracic stand point I have seen and examined Ula LingoEric Sickinger and agree with the above assessment  and plan.  Delight OvensEdward B Jaidon Ellery MD Beeper [phone] 09/14/2017 11:15 AM

## 2017-09-15 ENCOUNTER — Ambulatory Visit: Payer: Self-pay

## 2017-09-15 ENCOUNTER — Other Ambulatory Visit: Payer: Self-pay

## 2017-09-15 DIAGNOSIS — I82401 Acute embolism and thrombosis of unspecified deep veins of right lower extremity: Secondary | ICD-10-CM

## 2017-09-15 LAB — ACID FAST CULTURE WITH REFLEXED SENSITIVITIES: ACID FAST CULTURE - AFSCU3: NEGATIVE

## 2017-09-15 LAB — ACID FAST CULTURE WITH REFLEXED SENSITIVITIES (MYCOBACTERIA)

## 2017-09-18 LAB — ACID FAST CULTURE WITH REFLEXED SENSITIVITIES: ACID FAST CULTURE - AFSCU3: NEGATIVE

## 2017-09-18 LAB — ACID FAST CULTURE WITH REFLEXED SENSITIVITIES (MYCOBACTERIA)

## 2017-09-28 ENCOUNTER — Telehealth: Payer: Self-pay

## 2017-09-28 NOTE — Telephone Encounter (Signed)
[name] contacted the office concerned that "my lung is filling up again".  He stated that because this has happened 3 times already, that he knew when it was coming.  He stated that he felt pressure and was coughing mucous up.  He stated that he was having some shortness of breath with activity which is no normal.  He stated that he has a follow-up appointment this coming Monday, August 5th.  I told him he could come in tomorrow 7/30 and get a chest xray.  He stated that he would not have money until next week.  I explained that if he did not want to come with an appointment that if he became extremely short of breath he needed to contact 911 and go to the emergency room.  He acknowledged receipt.  I also stated that if he changed his mind and felt he needed to come in tomorrow to give us a called back and we would get him scheduled.  He acknowledged receipt.  Otherwise we will see him at his follow-up appointment early next week.

## 2017-10-02 ENCOUNTER — Other Ambulatory Visit: Payer: Self-pay | Admitting: *Deleted

## 2017-10-02 DIAGNOSIS — J869 Pyothorax without fistula: Secondary | ICD-10-CM

## 2017-10-05 ENCOUNTER — Other Ambulatory Visit: Payer: Self-pay

## 2017-10-05 ENCOUNTER — Ambulatory Visit (INDEPENDENT_AMBULATORY_CARE_PROVIDER_SITE_OTHER): Payer: Self-pay | Admitting: Surgical

## 2017-10-05 ENCOUNTER — Ambulatory Visit
Admission: RE | Admit: 2017-10-05 | Discharge: 2017-10-05 | Disposition: A | Discharge status: Home / Self Care | Payer: Self-pay | Source: Ambulatory Visit | Attending: Cardiothoracic Surgery | Admitting: Cardiothoracic Surgery

## 2017-10-05 ENCOUNTER — Ambulatory Visit: Payer: Self-pay

## 2017-10-05 VITALS — BP 132/98 | HR 67 | Resp 18 | Ht 73.0 in | Wt 250.8 lb

## 2017-10-05 DIAGNOSIS — J869 Pyothorax without fistula: Secondary | ICD-10-CM

## 2017-10-05 NOTE — Patient Instructions (Signed)
Discussed returning to work at half time with restrictions

## 2017-10-05 NOTE — Progress Notes (Signed)
[address]       [place] [address]             [phone]      Ghassan Coggeshall [facility] Medical Record #[id number] Date of Birth: 10/01/1960  Referring: Elgergawy, Leana Roe, MD Primary Care: Loletta Specter, PA-C Primary Cardiologist: No primary care provider on file.   Chief Complaint:   POST OP FOLLOW UP  OPERATIVE REPORT  DATE OF PROCEDURE:  08/06/2017  PREOPERATIVE DIAGNOSIS:  Left empyema.  POSTOPERATIVE DIAGNOSIS:  Left empyema.  SURGICAL PROCEDURES:  Video bronchoscopy, left video-assisted thoracoscopy, mini thoracotomy, drainage of left empyema with decortication.  SURGEON:  Gwenith Daily. Tyrone Sage, MD  ASSISTANT:   Lowella Dandy, PA.   History of Present Illness:    Patient is a 57 year old male status post the above described procedure seen in the office today's date for surgical follow-up.  Currently he describes overall feeling MOSTLY  well.  It is noted that he was also recently diagnosed with a right DVT and pulmonary embolisms.  He is on apixaban for this.  He does have occasional shortness of breath but no sputum production.  He denies fevers, chills or other constitutional symptoms.  He denies palpitations or chest pain.  There is some left-sided numbness to his chest from his previous VATS procedure.      Past Medical History:  Diagnosis Date  . Empyema lung (HCC)      Social History   Tobacco Use  Smoking Status Current Every Day Smoker  . Packs/day: 0.10  . Years: 20.00  . Pack years: 2.00  Smokeless Tobacco Never Used    Social History   Substance and Sexual Activity  Alcohol Use Yes  . Alcohol/week: 3.6 oz  . Types: 6 Cans of beer per week     No Known Allergies  Current Outpatient Medications  Medication Sig Dispense Refill  . acetaminophen (TYLENOL) 500 MG tablet Take 1,000 mg by mouth every 6 (six) hours.    [name] apixaban (ELIQUIS) 5 MG TABS tablet Take 2 tablets (10 mg total) by mouth 2 (two) times  daily for 5 days, THEN 1 tablet (5 mg total) 2 (two) times daily. 80 tablet 0  . NONFORMULARY OR COMPOUNDED ITEM Graduated compression hose 20-30 mm Hg thigh high. Apply to right leg daily. 1 each 0   No current facility-administered medications for this visit.        Physical Exam: Ht 6\' 1"  (1.854 m)   BMI 32.72 kg/m   General appearance: alert, cooperative and no distress Heart: regular rate and rhythm Lungs: Diminished breath sounds in the left lower fields. Abdomen: Benign exam Extremities: Right leg diameter greater than left with some mild edema Wound: Incision well-healed without signs of infection   Diagnostic Studies & Laboratory data:     Recent Radiology Findings:   Dg Chest 2 View  Result Date: 10/05/2017 CLINICAL DATA:  Status post video-assisted thoracoscopy for left empyema drainage on August 06, 2017 no current complaints. EXAM: CHEST - 2 VIEW COMPARISON:  Chest x-ray of September 14, 2017 FINDINGS: There remains volume loss on the left with a small amount of fluid with some loculated air persisting at the left base laterally. There is no mediastinal shift. The right lung is clear. The heart and pulmonary vascularity are normal. IMPRESSION: Persistent left pleural effusion-empyema. Small amounts of air are observed within the soft tissue densities. Chest CT scanning is recommended. No acute abnormality is observed  elsewhere. Electronically Signed   By: David  SwazilandJordan M.D.   On: 10/05/2017 15:00       Recent Lab Findings: Lab Results  Component Value Date   WBC 6.8 09/12/2017   HGB 14.4 09/12/2017   HCT 42.2 09/12/2017   PLT 160 09/12/2017   GLUCOSE 91 09/14/2017   ALT 10 09/10/2017   AST 15 09/10/2017   NA 138 09/14/2017   K 3.6 09/14/2017   CL 104 09/14/2017   CREATININE 0.73 09/14/2017   BUN 8 09/14/2017   CO2 25 09/14/2017   INR 1.25 09/08/2017      Assessment / Plan: The patient clinically appears to be stable.  His chest x-ray results are noted and I  discussed them with Dr. Tyrone SageGerhardt who reviewed them as well.  He does not require a CT scan per Dr. Tyrone SageGerhardt at this time.  We will repeat a chest x-ray in 3 weeks and have him seen again.  Clinically he appears to be tolerating this fairly well but the shortness of breath may be associated with the chronicity of the left lower lobe findings in conjunction with the recent pulmonary embolisms.          Rowe ClackWayne E Jobany Montellano, PA-C 10/05/2017 3:08 PM PAGER [phone]

## 2017-10-08 ENCOUNTER — Ambulatory Visit (INDEPENDENT_AMBULATORY_CARE_PROVIDER_SITE_OTHER): Payer: Self-pay | Admitting: Physician Assistant

## 2017-10-15 ENCOUNTER — Emergency Department (HOSPITAL_COMMUNITY): Payer: Self-pay

## 2017-10-15 ENCOUNTER — Telehealth: Payer: Self-pay

## 2017-10-15 ENCOUNTER — Encounter (HOSPITAL_COMMUNITY): Payer: Self-pay

## 2017-10-15 ENCOUNTER — Emergency Department (HOSPITAL_COMMUNITY)
Admission: EM | Admit: 2017-10-15 | Discharge: 2017-10-16 | Disposition: A | Discharge status: Home / Self Care | Payer: Self-pay | Attending: Emergency Medicine | Admitting: Emergency Medicine

## 2017-10-15 DIAGNOSIS — Z87891 Personal history of nicotine dependence: Secondary | ICD-10-CM | POA: Insufficient documentation

## 2017-10-15 DIAGNOSIS — Z7901 Long term (current) use of anticoagulants: Secondary | ICD-10-CM | POA: Insufficient documentation

## 2017-10-15 DIAGNOSIS — R0602 Shortness of breath: Secondary | ICD-10-CM | POA: Insufficient documentation

## 2017-10-15 DIAGNOSIS — G8918 Other acute postprocedural pain: Secondary | ICD-10-CM | POA: Insufficient documentation

## 2017-10-15 DIAGNOSIS — D751 Secondary polycythemia: Secondary | ICD-10-CM | POA: Insufficient documentation

## 2017-10-15 LAB — CBC WITH DIFFERENTIAL/PLATELET
BASOS ABS: 0 10*3/uL (ref 0.0–0.1)
Basophils Relative: 0 %
EOS ABS: 0.1 10*3/uL (ref 0.0–0.7)
EOS PCT: 1 %
HCT: 53.3 % — ABNORMAL HIGH (ref 39.0–52.0)
Hemoglobin: 18.6 g/dL — ABNORMAL HIGH (ref 13.0–17.0)
LYMPHS ABS: 3.6 10*3/uL (ref 0.7–4.0)
Lymphocytes Relative: 53 %
MCH: 36.8 pg — AB (ref 26.0–34.0)
MCHC: 34.9 g/dL (ref 30.0–36.0)
MCV: 105.3 fL — ABNORMAL HIGH (ref 78.0–100.0)
Monocytes Absolute: 0.6 10*3/uL (ref 0.1–1.0)
Monocytes Relative: 8 %
Neutro Abs: 2.7 10*3/uL (ref 1.7–7.7)
Neutrophils Relative %: 38 %
PLATELETS: 233 10*3/uL (ref 150–400)
RBC: 5.06 MIL/uL (ref 4.22–5.81)
RDW: 17.8 % — ABNORMAL HIGH (ref 11.5–15.5)
WBC: 7 10*3/uL (ref 4.0–10.5)

## 2017-10-15 LAB — COMPREHENSIVE METABOLIC PANEL
ALK PHOS: 93 U/L (ref 38–126)
ALT: 19 U/L (ref 0–44)
AST: 39 U/L (ref 15–41)
Albumin: 3.9 g/dL (ref 3.5–5.0)
Anion gap: 11 (ref 5–15)
BILIRUBIN TOTAL: 0.9 mg/dL (ref 0.3–1.2)
BUN: 8 mg/dL (ref 6–20)
CO2: 22 mmol/L (ref 22–32)
CREATININE: 0.91 mg/dL (ref 0.61–1.24)
Calcium: 8.7 mg/dL — ABNORMAL LOW (ref 8.9–10.3)
Chloride: 110 mmol/L (ref 98–111)
GFR calc Af Amer: >60 mL/min (ref >60)
GFR calc non Af Amer: >60 mL/min (ref >60)
Glucose, Bld: 107 mg/dL — ABNORMAL HIGH (ref 70–99)
Potassium: 4.6 mmol/L (ref 3.5–5.1)
Sodium: 143 mmol/L (ref 135–145)
TOTAL PROTEIN: 7.3 g/dL (ref 6.5–8.1)

## 2017-10-15 LAB — I-STAT TROPONIN, ED: TROPONIN I, POC: 0 ng/mL (ref 0.00–0.08)

## 2017-10-15 LAB — BRAIN NATRIURETIC PEPTIDE: B Natriuretic Peptide: 37.8 pg/mL (ref 0.0–100.0)

## 2017-10-15 MED ORDER — IOPAMIDOL (ISOVUE-370) INJECTION 76%
INTRAVENOUS | Status: AC
  Filled 2017-10-15: qty 100

## 2017-10-15 MED ORDER — IOPAMIDOL (ISOVUE-370) INJECTION 76%
100.0000 mL | Freq: Once | INTRAVENOUS | Status: AC | PRN
  Administered 2017-10-15: 100 mL via INTRAVENOUS

## 2017-10-15 NOTE — ED Triage Notes (Signed)
Pt complains of being short of breath and left sided soreness, he has hx of lung issues and has had fluid drained from his lungs in the past Pt states that side is starting to swell

## 2017-10-15 NOTE — Telephone Encounter (Signed)
[name] called today concerned about the feeling of his lung "heavy" and "swelling up".  He stated that he felt like it was hard to take a deep breath.  He felt like there was a "weight" on his lung, left in particular.  He wanted advice as to what he should do.  I told him we could get him an appointment with Dr. Tyrone SageGerhardt tomorrow morning with a chest xray to be checked out.  He felt that was too long of a wait, and that he wanted to have something done today.  With his history, I advised that if he felt severe shortness of breath and was unable to catch his breath, he needed to go to the emergency room.  He acknowledged receipt and Dr. Tyrone SageGerhardt was made aware.

## 2017-10-15 NOTE — ED Notes (Signed)
Patient transported to X-ray 
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
## 2017-10-15 NOTE — Discharge Instructions (Signed)
Please follow-up with your primary care provider or with the wellness clinic to discuss your elevated hemoglobin.  Please return to the ED if you have chest pain, headaches or new symptoms.

## 2017-10-16 ENCOUNTER — Ambulatory Visit: Payer: Self-pay | Admitting: Vascular Surgery

## 2017-10-16 ENCOUNTER — Ambulatory Visit (HOSPITAL_COMMUNITY): Admit: 2017-10-16 | Payer: Self-pay

## 2017-10-16 ENCOUNTER — Encounter: Payer: Self-pay | Admitting: Vascular Surgery

## 2017-10-26 ENCOUNTER — Other Ambulatory Visit: Payer: Self-pay | Admitting: Cardiothoracic Surgery

## 2017-10-26 DIAGNOSIS — J90 Pleural effusion, not elsewhere classified: Secondary | ICD-10-CM

## 2017-10-29 ENCOUNTER — Ambulatory Visit (INDEPENDENT_AMBULATORY_CARE_PROVIDER_SITE_OTHER): Payer: Self-pay | Admitting: Cardiothoracic Surgery

## 2017-10-29 ENCOUNTER — Other Ambulatory Visit: Payer: Self-pay

## 2017-10-29 ENCOUNTER — Ambulatory Visit
Admission: RE | Admit: 2017-10-29 | Discharge: 2017-10-29 | Disposition: A | Discharge status: Home / Self Care | Payer: No Typology Code available for payment source | Source: Ambulatory Visit | Attending: Cardiothoracic Surgery | Admitting: Cardiothoracic Surgery

## 2017-10-29 VITALS — BP 134/95 | HR 89 | Resp 16 | Ht 73.0 in | Wt 245.0 lb

## 2017-10-29 DIAGNOSIS — I82401 Acute embolism and thrombosis of unspecified deep veins of right lower extremity: Secondary | ICD-10-CM

## 2017-10-29 DIAGNOSIS — I2699 Other pulmonary embolism without acute cor pulmonale: Secondary | ICD-10-CM

## 2017-10-29 DIAGNOSIS — J869 Pyothorax without fistula: Secondary | ICD-10-CM

## 2017-10-29 DIAGNOSIS — J90 Pleural effusion, not elsewhere classified: Secondary | ICD-10-CM

## 2017-10-29 DIAGNOSIS — Z09 Encounter for follow-up examination after completed treatment for conditions other than malignant neoplasm: Secondary | ICD-10-CM

## 2017-10-29 NOTE — Progress Notes (Signed)
[address]       [place] [id number]             [phone]      Ula Lingoric [facility] Medical Record #[id number] Date of Birth: February 13, 1961  Referring: Elgergawy, Leana Roeawood S, MD Primary Care: Loletta SpecterGomez, Roger David, PA-C Primary Cardiologist: No primary care provider on file.   Chief Complaint:   POST OP FOLLOW UP 08/06/2017 PREOPERATIVE DIAGNOSIS:  Left empyema. POSTOPERATIVE DIAGNOSIS:  Left empyema. SURGICAL PROCEDURES:  Video bronchoscopy, left video-assisted thoracoscopy, mini thoracotomy, drainage of left empyema with decortication. SURGEON:  Gwenith DailyEdward B. Tyrone SageGerhardt, MD ASSISTANT:   Lowella DandyErin Barrett, PA.  History of Present Illness:     Patient returns to the office today after recent hospitalization for DVT and pulmonary emboli, in office today he notes that overall he feels better denies significant shortness of breath.  On close questioning it is not clear that he has been taking his Eliquis he said he has not had any for 2 days.     Past Medical History:  Diagnosis Date  . Empyema lung (HCC)      Social History   Tobacco Use  Smoking Status Former Smoker  . Packs/day: 0.10  . Years: 20.00  . Pack years: 2.00  . Last attempt to quit: 08/06/2017  . Years since quitting: 0.2  Smokeless Tobacco Never Used    Social History   Substance and Sexual Activity  Alcohol Use Yes  . Alcohol/week: 6.0 standard drinks  . Types: 6 Cans of beer per week     No Known Allergies  Current Outpatient Medications  Medication Sig Dispense Refill  . acetaminophen (TYLENOL) 500 MG tablet Take 1,000 mg by mouth every 6 (six) hours.    [name]. apixaban (ELIQUIS) 5 MG TABS tablet Take 2 tablets (10 mg total) by mouth 2 (two) times daily for 5 days, THEN 1 tablet (5 mg total) 2 (two) times daily. (Patient taking differently: Take 5 mg by mouth twice daily) 80 tablet 0  . NONFORMULARY OR COMPOUNDED ITEM Graduated compression hose 20-30 mm Hg thigh high. Apply to right leg  daily. (Patient not taking: Reported on 10/29/2017) 1 each 0   No current facility-administered medications for this visit.        Physical Exam: BP (!) 134/95 (BP Location: Right Arm, Patient Position: Sitting, Cuff Size: Large)   Pulse 89   Resp 16   Ht 6\' 1"  (1.854 m)   Wt 245 lb (111.1 kg)   SpO2 95% Comment: ON RA  BMI 32.32 kg/m   General appearance: alert, cooperative, appears older than stated age and no distress Neurologic: intact Heart: regular rate and rhythm, S1, S2 normal, no murmur, click, rub or gallop Lungs: diminished breath sounds LLL Abdomen: soft, non-tender; bowel sounds normal; no masses,  no organomegaly Extremities: extremities normal, atraumatic, no cyanosis or edema and Homans sign is negative, no sign of DVT Wound: Incisions are well-healed without evidence of infection   Diagnostic Studies & Laboratory data:     Recent Radiology Findings:   Dg Chest 2 View  Result Date: 10/29/2017 CLINICAL DATA:  Follow-up left pleural effusion EXAM: CHEST - 2 VIEW COMPARISON:  10/15/2017 CT of the chest FINDINGS: Cardiac shadow is stable. Right lung is well aerated and clear. Stable changes in the left base are noted similar to the prior CT examination. No new focal abnormality is seen. No bony abnormality is noted. IMPRESSION: Stable changes of infiltrate and  loculated effusion in the left base when compared with the prior exam. Electronically Signed   By: Alcide Clever M.D.   On: 10/29/2017 10:18      Recent Lab Findings: Lab Results  Component Value Date   WBC 7.0 10/15/2017   HGB 18.6 (H) 10/15/2017   HCT 53.3 (H) 10/15/2017   PLT 233 10/15/2017   GLUCOSE 107 (H) 10/15/2017   ALT 19 10/15/2017   AST 39 10/15/2017   NA 143 10/15/2017   K 4.6 10/15/2017   CL 110 10/15/2017   CREATININE 0.91 10/15/2017   BUN 8 10/15/2017   CO2 22 10/15/2017   INR 1.25 09/08/2017      Assessment / Plan:   Patient stable from a surgical standpoint, chest x-ray  remains stable The patient does not give reliable information about whether he is taking Eliquis or not, it appears that he cannot afford it. He was [facility] straight [facility] medical follow-up following his hospitalization and also to assist in obtaining the prescribed medications. Plan to see him back in 4 weeks for follow-up chest x-ray     Delight Ovens MD      [address] [place] [place] [address] Office [phone]   Beeper [phone]  10/29/2017 11:36 AM

## 2017-12-31 ENCOUNTER — Encounter: Payer: Self-pay | Admitting: Cardiothoracic Surgery

## 2019-05-16 ENCOUNTER — Emergency Department (HOSPITAL_COMMUNITY)
Admission: EM | Admit: 2019-05-16 | Discharge: 2019-05-16 | Disposition: A | Discharge status: Home / Self Care | Payer: Medicaid Other | Attending: Emergency Medicine | Admitting: Emergency Medicine

## 2019-05-16 ENCOUNTER — Emergency Department (HOSPITAL_COMMUNITY): Payer: Medicaid Other

## 2019-05-16 ENCOUNTER — Other Ambulatory Visit: Payer: Self-pay

## 2019-05-16 ENCOUNTER — Encounter (HOSPITAL_COMMUNITY): Payer: Self-pay

## 2019-05-16 DIAGNOSIS — R05 Cough: Secondary | ICD-10-CM | POA: Diagnosis not present

## 2019-05-16 DIAGNOSIS — Z87891 Personal history of nicotine dependence: Secondary | ICD-10-CM | POA: Diagnosis not present

## 2019-05-16 DIAGNOSIS — Z20822 Contact with and (suspected) exposure to covid-19: Secondary | ICD-10-CM | POA: Insufficient documentation

## 2019-05-16 DIAGNOSIS — R059 Cough, unspecified: Secondary | ICD-10-CM

## 2019-05-16 DIAGNOSIS — R0602 Shortness of breath: Secondary | ICD-10-CM | POA: Diagnosis present

## 2019-05-16 LAB — CBC WITH DIFFERENTIAL/PLATELET
Abs Immature Granulocytes: 0.09 10*3/uL — ABNORMAL HIGH (ref 0.00–0.07)
Basophils Absolute: 0.1 10*3/uL (ref 0.0–0.1)
Basophils Relative: 0 %
Eosinophils Absolute: 0 10*3/uL (ref 0.0–0.5)
Eosinophils Relative: 0 %
HCT: 46.1 % (ref 39.0–52.0)
Hemoglobin: 16.3 g/dL (ref 13.0–17.0)
Immature Granulocytes: 1 %
Lymphocytes Relative: 22 %
Lymphs Abs: 3.1 10*3/uL (ref 0.7–4.0)
MCH: 39.7 pg — ABNORMAL HIGH (ref 26.0–34.0)
MCHC: 35.4 g/dL (ref 30.0–36.0)
MCV: 112.2 fL — ABNORMAL HIGH (ref 80.0–100.0)
Monocytes Absolute: 0.8 10*3/uL (ref 0.1–1.0)
Monocytes Relative: 6 %
Neutro Abs: 9.9 10*3/uL — ABNORMAL HIGH (ref 1.7–7.7)
Neutrophils Relative %: 71 %
Platelets: 285 10*3/uL (ref 150–400)
RBC: 4.11 MIL/uL — ABNORMAL LOW (ref 4.22–5.81)
RDW: 12.7 % (ref 11.5–15.5)
WBC: 14.1 10*3/uL — ABNORMAL HIGH (ref 4.0–10.5)
nRBC: 0 % (ref 0.0–0.2)

## 2019-05-16 LAB — COMPREHENSIVE METABOLIC PANEL
ALT: 17 U/L (ref 0–44)
AST: 29 U/L (ref 15–41)
Albumin: 4.6 g/dL (ref 3.5–5.0)
Alkaline Phosphatase: 70 U/L (ref 38–126)
Anion gap: 19 — ABNORMAL HIGH (ref 5–15)
BUN: 16 mg/dL (ref 6–20)
CO2: 12 mmol/L — ABNORMAL LOW (ref 22–32)
Calcium: 9.7 mg/dL (ref 8.9–10.3)
Chloride: 103 mmol/L (ref 98–111)
Creatinine, Ser: 1.19 mg/dL (ref 0.61–1.24)
GFR calc Af Amer: >60 mL/min (ref >60)
GFR calc non Af Amer: >60 mL/min (ref >60)
Glucose, Bld: 97 mg/dL (ref 70–99)
Potassium: 4.2 mmol/L (ref 3.5–5.1)
Sodium: 134 mmol/L — ABNORMAL LOW (ref 135–145)
Total Bilirubin: 1.3 mg/dL — ABNORMAL HIGH (ref 0.3–1.2)
Total Protein: 8.3 g/dL — ABNORMAL HIGH (ref 6.5–8.1)

## 2019-05-16 LAB — RESPIRATORY PANEL BY RT PCR (FLU A&B, COVID)
Influenza A by PCR: NEGATIVE
Influenza B by PCR: NEGATIVE
SARS Coronavirus 2 by RT PCR: NEGATIVE

## 2019-05-16 LAB — D-DIMER, QUANTITATIVE (NOT AT ARMC): D-Dimer, Quant: <0.27 ug/mL-FEU (ref 0.00–0.50)

## 2019-05-16 MED ORDER — IOHEXOL 300 MG/ML  SOLN
75.0000 mL | Freq: Once | INTRAMUSCULAR | Status: AC | PRN
  Administered 2019-05-16: 75 mL via INTRAVENOUS

## 2019-05-16 MED ORDER — SODIUM CHLORIDE (PF) 0.9 % IJ SOLN
INTRAMUSCULAR | Status: AC
  Filled 2019-05-16: qty 50

## 2019-05-16 MED ORDER — LEVOFLOXACIN 750 MG PO TABS: 750.0000 mg | ORAL_TABLET | Freq: Every day | ORAL | 0 refills | Status: DC

## 2019-05-16 MED ORDER — SODIUM CHLORIDE 0.9 % IV BOLUS (SEPSIS)
1000.0000 mL | Freq: Once | INTRAVENOUS | Status: AC
  Administered 2019-05-16: 1000 mL via INTRAVENOUS

## 2019-05-16 NOTE — Discharge Instructions (Addendum)
Get some magnesium citrate from the pharmacy to help with your constipation.  Take the new antibiotic for your cough and congestion and follow-up in the next week for recheck

## 2019-05-16 NOTE — ED Triage Notes (Addendum)
Pt BIB EMS from home. Pt c/o SOB, generalized weakness x2 weeks. Pt c/o LUQ pain. Pt has hx of lung surgeries, chest tube placements. Pt c/o decreased appetite 2 weeks, coughing up clear sputum.    100% RA 148/106 BP 105 HR 18 Resp CBG 99
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
## 2019-06-02 DIAGNOSIS — G610 Guillain-Barre syndrome: Secondary | ICD-10-CM

## 2019-06-02 HISTORY — DX: Guillain-Barre syndrome: G61.0

## 2019-06-14 ENCOUNTER — Inpatient Hospital Stay (HOSPITAL_COMMUNITY)
Admission: EM | Admit: 2019-06-14 | Discharge: 2019-06-22 | DRG: 096 | Disposition: A | Discharge status: Home Health | Payer: Medicaid Other | Attending: Internal Medicine | Admitting: Internal Medicine

## 2019-06-14 ENCOUNTER — Emergency Department (HOSPITAL_COMMUNITY): Payer: Medicaid Other

## 2019-06-14 ENCOUNTER — Encounter (HOSPITAL_COMMUNITY): Payer: Self-pay | Admitting: Emergency Medicine

## 2019-06-14 ENCOUNTER — Other Ambulatory Visit: Payer: Self-pay

## 2019-06-14 DIAGNOSIS — R945 Abnormal results of liver function studies: Secondary | ICD-10-CM

## 2019-06-14 DIAGNOSIS — R739 Hyperglycemia, unspecified: Secondary | ICD-10-CM | POA: Diagnosis present

## 2019-06-14 DIAGNOSIS — Z86711 Personal history of pulmonary embolism: Secondary | ICD-10-CM | POA: Diagnosis not present

## 2019-06-14 DIAGNOSIS — E538 Deficiency of other specified B group vitamins: Secondary | ICD-10-CM | POA: Diagnosis not present

## 2019-06-14 DIAGNOSIS — R822 Biliuria: Secondary | ICD-10-CM | POA: Diagnosis present

## 2019-06-14 DIAGNOSIS — D539 Nutritional anemia, unspecified: Secondary | ICD-10-CM | POA: Diagnosis not present

## 2019-06-14 DIAGNOSIS — K5900 Constipation, unspecified: Secondary | ICD-10-CM | POA: Diagnosis not present

## 2019-06-14 DIAGNOSIS — Z66 Do not resuscitate: Secondary | ICD-10-CM | POA: Diagnosis not present

## 2019-06-14 DIAGNOSIS — R531 Weakness: Secondary | ICD-10-CM

## 2019-06-14 DIAGNOSIS — M4802 Spinal stenosis, cervical region: Secondary | ICD-10-CM | POA: Diagnosis present

## 2019-06-14 DIAGNOSIS — D509 Iron deficiency anemia, unspecified: Secondary | ICD-10-CM | POA: Diagnosis not present

## 2019-06-14 DIAGNOSIS — G610 Guillain-Barre syndrome: Secondary | ICD-10-CM | POA: Diagnosis present

## 2019-06-14 DIAGNOSIS — R296 Repeated falls: Secondary | ICD-10-CM | POA: Diagnosis not present

## 2019-06-14 DIAGNOSIS — Z20822 Contact with and (suspected) exposure to covid-19: Secondary | ICD-10-CM | POA: Diagnosis present

## 2019-06-14 DIAGNOSIS — Z87891 Personal history of nicotine dependence: Secondary | ICD-10-CM | POA: Diagnosis not present

## 2019-06-14 DIAGNOSIS — E876 Hypokalemia: Secondary | ICD-10-CM | POA: Diagnosis not present

## 2019-06-14 DIAGNOSIS — M720 Palmar fascial fibromatosis [Dupuytren]: Secondary | ICD-10-CM | POA: Diagnosis present

## 2019-06-14 DIAGNOSIS — Z86718 Personal history of other venous thrombosis and embolism: Secondary | ICD-10-CM

## 2019-06-14 DIAGNOSIS — K760 Fatty (change of) liver, not elsewhere classified: Secondary | ICD-10-CM | POA: Diagnosis not present

## 2019-06-14 DIAGNOSIS — M47812 Spondylosis without myelopathy or radiculopathy, cervical region: Secondary | ICD-10-CM | POA: Diagnosis not present

## 2019-06-14 LAB — COMPREHENSIVE METABOLIC PANEL
ALT: 23 U/L (ref 0–44)
AST: 35 U/L (ref 15–41)
Albumin: 3.7 g/dL (ref 3.5–5.0)
Alkaline Phosphatase: 83 U/L (ref 38–126)
Anion gap: 13 (ref 5–15)
BUN: 12 mg/dL (ref 6–20)
CO2: 22 mmol/L (ref 22–32)
Calcium: 8.9 mg/dL (ref 8.9–10.3)
Chloride: 103 mmol/L (ref 98–111)
Creatinine, Ser: 0.89 mg/dL (ref 0.61–1.24)
GFR calc Af Amer: >60 mL/min (ref >60)
GFR calc non Af Amer: >60 mL/min (ref >60)
Glucose, Bld: 114 mg/dL — ABNORMAL HIGH (ref 70–99)
Potassium: 3.6 mmol/L (ref 3.5–5.1)
Sodium: 138 mmol/L (ref 135–145)
Total Bilirubin: 2.3 mg/dL — ABNORMAL HIGH (ref 0.3–1.2)
Total Protein: 7.4 g/dL (ref 6.5–8.1)

## 2019-06-14 LAB — CBC WITH DIFFERENTIAL/PLATELET
Abs Immature Granulocytes: 0.02 10*3/uL (ref 0.00–0.07)
Basophils Absolute: 0.1 10*3/uL (ref 0.0–0.1)
Basophils Relative: 1 %
Eosinophils Absolute: 0 10*3/uL (ref 0.0–0.5)
Eosinophils Relative: 1 %
HCT: 44.1 % (ref 39.0–52.0)
Hemoglobin: 15.1 g/dL (ref 13.0–17.0)
Immature Granulocytes: 0 %
Lymphocytes Relative: 29 %
Lymphs Abs: 2.5 10*3/uL (ref 0.7–4.0)
MCH: 37.8 pg — ABNORMAL HIGH (ref 26.0–34.0)
MCHC: 34.2 g/dL (ref 30.0–36.0)
MCV: 110.5 fL — ABNORMAL HIGH (ref 80.0–100.0)
Monocytes Absolute: 0.9 10*3/uL (ref 0.1–1.0)
Monocytes Relative: 11 %
Neutro Abs: 5.3 10*3/uL (ref 1.7–7.7)
Neutrophils Relative %: 58 %
Platelets: 221 10*3/uL (ref 150–400)
RBC: 3.99 MIL/uL — ABNORMAL LOW (ref 4.22–5.81)
RDW: 14.6 % (ref 11.5–15.5)
WBC: 8.9 10*3/uL (ref 4.0–10.5)
nRBC: 0 % (ref 0.0–0.2)

## 2019-06-14 LAB — URINALYSIS, ROUTINE W REFLEX MICROSCOPIC
Glucose, UA: NEGATIVE mg/dL
Hgb urine dipstick: NEGATIVE
Ketones, ur: NEGATIVE mg/dL
Leukocytes,Ua: NEGATIVE
Nitrite: NEGATIVE
Protein, ur: 30 mg/dL — AB
Specific Gravity, Urine: 1.027 (ref 1.005–1.030)
pH: 5 (ref 5.0–8.0)

## 2019-06-14 LAB — TROPONIN I (HIGH SENSITIVITY): Troponin I (High Sensitivity): 4 ng/L (ref <18)

## 2019-06-14 LAB — TSH: TSH: 1.677 u[IU]/mL (ref 0.350–4.500)

## 2019-06-14 LAB — BRAIN NATRIURETIC PEPTIDE: B Natriuretic Peptide: 64.1 pg/mL (ref 0.0–100.0)

## 2019-06-14 MED ORDER — SODIUM CHLORIDE 0.9 % IV BOLUS
1000.0000 mL | Freq: Once | INTRAVENOUS | Status: AC
  Administered 2019-06-14: 1000 mL via INTRAVENOUS
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
## 2019-06-14 NOTE — ED Notes (Signed)
Callie, charge RN aware of patients arrival

## 2019-06-14 NOTE — ED Notes (Signed)
Pt was ambulated, pt also weak and dizzy. Wasn't able to stand and hold balance pt states feel as if the world is spinning fast and pt is not moving. Pt showed dizziness and weakness.

## 2019-06-14 NOTE — ED Triage Notes (Signed)
Patient here from home with complaints of fatigue and numbness from "head to toe" x1 month. States that he is unable to ambulate "like I use to ". Reports repeat falls. Denies blood thinners.

## 2019-06-14 NOTE — ED Notes (Signed)
[facility] called for transportation to [facility] ED to see Neuro

## 2019-06-15 ENCOUNTER — Inpatient Hospital Stay (HOSPITAL_COMMUNITY): Payer: Medicaid Other

## 2019-06-15 ENCOUNTER — Inpatient Hospital Stay (HOSPITAL_COMMUNITY): Payer: Self-pay

## 2019-06-15 ENCOUNTER — Encounter (HOSPITAL_COMMUNITY): Payer: Self-pay | Admitting: Internal Medicine

## 2019-06-15 ENCOUNTER — Emergency Department (HOSPITAL_COMMUNITY): Payer: Medicaid Other

## 2019-06-15 DIAGNOSIS — M720 Palmar fascial fibromatosis [Dupuytren]: Secondary | ICD-10-CM | POA: Insufficient documentation

## 2019-06-15 DIAGNOSIS — Z86718 Personal history of other venous thrombosis and embolism: Secondary | ICD-10-CM | POA: Diagnosis not present

## 2019-06-15 DIAGNOSIS — Z20822 Contact with and (suspected) exposure to covid-19: Secondary | ICD-10-CM | POA: Diagnosis present

## 2019-06-15 DIAGNOSIS — K5900 Constipation, unspecified: Secondary | ICD-10-CM | POA: Diagnosis present

## 2019-06-15 DIAGNOSIS — M4802 Spinal stenosis, cervical region: Secondary | ICD-10-CM | POA: Diagnosis present

## 2019-06-15 DIAGNOSIS — R531 Weakness: Secondary | ICD-10-CM

## 2019-06-15 DIAGNOSIS — E876 Hypokalemia: Secondary | ICD-10-CM | POA: Diagnosis present

## 2019-06-15 DIAGNOSIS — Z87891 Personal history of nicotine dependence: Secondary | ICD-10-CM | POA: Diagnosis not present

## 2019-06-15 DIAGNOSIS — R739 Hyperglycemia, unspecified: Secondary | ICD-10-CM | POA: Diagnosis present

## 2019-06-15 DIAGNOSIS — D539 Nutritional anemia, unspecified: Secondary | ICD-10-CM | POA: Diagnosis present

## 2019-06-15 DIAGNOSIS — Z86711 Personal history of pulmonary embolism: Secondary | ICD-10-CM | POA: Diagnosis not present

## 2019-06-15 DIAGNOSIS — G610 Guillain-Barre syndrome: Secondary | ICD-10-CM | POA: Diagnosis not present

## 2019-06-15 DIAGNOSIS — R822 Biliuria: Secondary | ICD-10-CM | POA: Diagnosis present

## 2019-06-15 DIAGNOSIS — Z66 Do not resuscitate: Secondary | ICD-10-CM | POA: Diagnosis not present

## 2019-06-15 DIAGNOSIS — R296 Repeated falls: Secondary | ICD-10-CM | POA: Diagnosis present

## 2019-06-15 DIAGNOSIS — M47812 Spondylosis without myelopathy or radiculopathy, cervical region: Secondary | ICD-10-CM | POA: Diagnosis present

## 2019-06-15 DIAGNOSIS — E538 Deficiency of other specified B group vitamins: Secondary | ICD-10-CM | POA: Diagnosis present

## 2019-06-15 DIAGNOSIS — D509 Iron deficiency anemia, unspecified: Secondary | ICD-10-CM | POA: Diagnosis present

## 2019-06-15 DIAGNOSIS — K760 Fatty (change of) liver, not elsewhere classified: Secondary | ICD-10-CM | POA: Diagnosis present

## 2019-06-15 LAB — CBC WITH DIFFERENTIAL/PLATELET
Abs Immature Granulocytes: 0 10*3/uL (ref 0.00–0.07)
Basophils Absolute: 0.1 10*3/uL (ref 0.0–0.1)
Basophils Relative: 1 %
Eosinophils Absolute: 0.1 10*3/uL (ref 0.0–0.5)
Eosinophils Relative: 1 %
HCT: 39.4 % (ref 39.0–52.0)
Hemoglobin: 13.6 g/dL (ref 13.0–17.0)
Lymphocytes Relative: 32 %
Lymphs Abs: 2.7 10*3/uL (ref 0.7–4.0)
MCH: 38.4 pg — ABNORMAL HIGH (ref 26.0–34.0)
MCHC: 34.5 g/dL (ref 30.0–36.0)
MCV: 111.3 fL — ABNORMAL HIGH (ref 80.0–100.0)
Monocytes Absolute: 0.9 10*3/uL (ref 0.1–1.0)
Monocytes Relative: 10 %
Neutro Abs: 4.8 10*3/uL (ref 1.7–7.7)
Neutrophils Relative %: 56 %
Platelets: 207 10*3/uL (ref 150–400)
RBC: 3.54 MIL/uL — ABNORMAL LOW (ref 4.22–5.81)
RDW: 14.6 % (ref 11.5–15.5)
WBC: 8.5 10*3/uL (ref 4.0–10.5)
nRBC: 0 % (ref 0.0–0.2)
nRBC: 0 /100 WBC

## 2019-06-15 LAB — PROTIME-INR
INR: 1.2 (ref 0.8–1.2)
Prothrombin Time: 14.8 seconds (ref 11.4–15.2)

## 2019-06-15 LAB — CSF CELL COUNT WITH DIFFERENTIAL
RBC Count, CSF: 45 /mm3 — ABNORMAL HIGH
RBC Count, CSF: 6 /mm3 — ABNORMAL HIGH
Tube #: 1
Tube #: 4
WBC, CSF: 0 /mm3 (ref 0–5)
WBC, CSF: 1 /mm3 (ref 0–5)

## 2019-06-15 LAB — RENAL FUNCTION PANEL
Albumin: 3.2 g/dL — ABNORMAL LOW (ref 3.5–5.0)
Anion gap: 11 (ref 5–15)
BUN: 11 mg/dL (ref 6–20)
CO2: 21 mmol/L — ABNORMAL LOW (ref 22–32)
Calcium: 8.6 mg/dL — ABNORMAL LOW (ref 8.9–10.3)
Chloride: 104 mmol/L (ref 98–111)
Creatinine, Ser: 0.87 mg/dL (ref 0.61–1.24)
GFR calc Af Amer: >60 mL/min (ref >60)
GFR calc non Af Amer: >60 mL/min (ref >60)
Glucose, Bld: 99 mg/dL (ref 70–99)
Phosphorus: 4.3 mg/dL (ref 2.5–4.6)
Potassium: 4 mmol/L (ref 3.5–5.1)
Sodium: 136 mmol/L (ref 135–145)

## 2019-06-15 LAB — SARS CORONAVIRUS 2 (TAT 6-24 HRS): SARS Coronavirus 2: NEGATIVE

## 2019-06-15 LAB — SEDIMENTATION RATE: Sed Rate: 10 mm/hr (ref 0–16)

## 2019-06-15 LAB — APTT: aPTT: 40 s — ABNORMAL HIGH (ref 24–36)

## 2019-06-15 LAB — HIV ANTIBODY (ROUTINE TESTING W REFLEX): HIV Screen 4th Generation wRfx: NONREACTIVE

## 2019-06-15 LAB — PROTEIN AND GLUCOSE, CSF
Glucose, CSF: 65 mg/dL (ref 40–70)
Total  Protein, CSF: 49 mg/dL — ABNORMAL HIGH (ref 15–45)

## 2019-06-15 LAB — C-REACTIVE PROTEIN: CRP: 0.5 mg/dL (ref <1.0)

## 2019-06-15 LAB — CK: Total CK: 58 U/L (ref 49–397)

## 2019-06-15 LAB — VITAMIN B12: Vitamin B-12: 457 pg/mL (ref 180–914)

## 2019-06-15 MED ORDER — OXYCODONE-ACETAMINOPHEN 5-325 MG PO TABS
2.0000 | ORAL_TABLET | ORAL | Status: DC | PRN
  Administered 2019-06-15 – 2019-06-22 (×21): 2 via ORAL
  Filled 2019-06-15 (×21): qty 2

## 2019-06-15 MED ORDER — ACETAMINOPHEN 325 MG PO TABS: 650.0000 mg | ORAL_TABLET | Freq: Four times a day (QID) | ORAL | Status: DC | PRN

## 2019-06-15 MED ORDER — IMMUNE GLOBULIN (HUMAN) 10 GM/100ML IV SOLN
400.0000 mg/kg | INTRAVENOUS | Status: AC
  Administered 2019-06-15 – 2019-06-19 (×5): 45 g via INTRAVENOUS
  Filled 2019-06-15 (×3): qty 400
  Filled 2019-06-15: qty 450
  Filled 2019-06-15: qty 400
  Filled 2019-06-15: qty 50

## 2019-06-15 MED ORDER — ENOXAPARIN SODIUM 40 MG/0.4ML ~~LOC~~ SOLN
40.0000 mg | SUBCUTANEOUS | Status: DC
  Administered 2019-06-18: 40 mg via SUBCUTANEOUS
  Filled 2019-06-15 (×4): qty 0.4

## 2019-06-15 MED ORDER — ONDANSETRON HCL 4 MG PO TABS: 4.0000 mg | ORAL_TABLET | Freq: Four times a day (QID) | ORAL | Status: DC | PRN

## 2019-06-15 MED ORDER — CYCLOBENZAPRINE HCL 10 MG PO TABS
10.0000 mg | ORAL_TABLET | Freq: Three times a day (TID) | ORAL | Status: DC | PRN
  Administered 2019-06-18 – 2019-06-21 (×7): 10 mg via ORAL
  Filled 2019-06-15 (×7): qty 1

## 2019-06-15 MED ORDER — ONDANSETRON HCL 4 MG/2ML IJ SOLN: 4.0000 mg | Freq: Four times a day (QID) | INTRAMUSCULAR | Status: DC | PRN

## 2019-06-15 MED ORDER — POLYETHYLENE GLYCOL 3350 17 G PO PACK: 17.0000 g | PACK | Freq: Every day | ORAL | Status: DC | PRN

## 2019-06-15 MED ORDER — GADOBUTROL 1 MMOL/ML IV SOLN
10.0000 mL | Freq: Once | INTRAVENOUS | Status: AC | PRN
  Administered 2019-06-15: 10 mL via INTRAVENOUS

## 2019-06-15 MED ORDER — OXYCODONE-ACETAMINOPHEN 5-325 MG PO TABS
1.0000 | ORAL_TABLET | ORAL | Status: DC | PRN
  Administered 2019-06-20: 1 via ORAL
  Filled 2019-06-15 (×3): qty 1

## 2019-06-15 MED ORDER — ACETAMINOPHEN 650 MG RE SUPP: 650.0000 mg | Freq: Four times a day (QID) | RECTAL | Status: DC | PRN

## 2019-06-15 MED ORDER — OXYCODONE-ACETAMINOPHEN 5-325 MG PO TABS
1.0000 | ORAL_TABLET | Freq: Once | ORAL | Status: AC
  Administered 2019-06-15: 1 via ORAL
  Filled 2019-06-15: qty 1

## 2019-06-15 MED ORDER — LACTATED RINGERS IV SOLN: INTRAVENOUS | Status: AC

## 2019-06-15 NOTE — ED Notes (Signed)
Requested phlebotomy to draw blood for this pt. +

## 2019-06-15 NOTE — ED Notes (Signed)
PT completed evaluation of this pt.

## 2019-06-15 NOTE — Progress Notes (Signed)
Patient seen on rounds this afternoon after having been admitted this AM for ascending paralysis. He reports feeling a little stronger, no weaker with no change in numbness involving the extremities and abdomen. Eating normally, breathing without difficulty. No new complaints.   D/w neurology. Labs have been sent and are pending, will begin empiric IVIG x5 days. This was discussed with the patient who agrees to proceed. Please see H&P this morning for further details of plan.   Hazeline Junker, MD 06/15/2019 5:55 PM

## 2019-06-15 NOTE — ED Notes (Signed)
Requested phlebotomy to draw labs on this pt.
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
## 2019-06-15 NOTE — Progress Notes (Signed)
NIF and VC ordered for patient.   NIF performed, with great effort, with achieved goal of greater than -40.  Unable to perform VC at this time due to spirometer in ER.  Will pass along when patient is transferred to floor.  Patient tolerated well.

## 2019-06-15 NOTE — Evaluation (Signed)
Physical Therapy Evaluation Patient Details Name: Bryce Mendoza MRN: [id number] DOB: 1960/04/14 Today's Date: 06/15/2019   History of Present Illness  Pt is a 59 y/o male admitted secondary to worsening weakness and numbness in BUE and BLE. Pt reports being completely numb in trunk. Work up pending; possible GB vs MS. PMH includes DVT, PE, and empyema of lung.   Clinical Impression  Pt admitted secondary to problem above with deficits below. Pt requiring min A to perform bed mobility tasks. Pt on higher stretcher and felt it was unsafe to attempt further mobility with +1 assist. Pt with notable weakness and sensory deficits in BUE and BLE extremities. Pt reports progressive weakness and falls. Feel pt will likely benefit from post acute therapy services, however, will determine most appropriate d/c recommendations based on mobility progression.     Follow Up Recommendations Supervision for mobility/OOB(likely CIR vs SNF pending mobility progression )    Equipment Recommendations  Other (comment)(TBD)    Recommendations for Other Services       Precautions / Restrictions Precautions Precautions: Fall Restrictions Weight Bearing Restrictions: No      Mobility  Bed Mobility Overal bed mobility: Needs Assistance Bed Mobility: Supine to Sit;Sit to Supine     Supine to sit: Min assist Sit to supine: Min assist   General bed mobility comments: Min A for trunk assist throughout. Heavy use of bed rail on stretcher. Given current weakness, felt it was unsafe to attempt to stand with +1 assist from higher stretcher height in ED.   Transfers                    Ambulation/Gait                Stairs            Wheelchair Mobility    Modified Rankin (Stroke Patients Only)       Balance Overall balance assessment: Needs assistance Sitting-balance support: No upper extremity supported;Feet supported Sitting balance-Leahy Scale: Fair                                        Pertinent Vitals/Pain Pain Assessment: Faces Faces Pain Scale: Hurts even more Pain Location: L hip  Pain Descriptors / Indicators: Burning(with movement ) Pain Intervention(s): Limited activity within patient's tolerance;Monitored during session;Repositioned    Home Living Family/patient expects to be discharged to:: Private residence Living Arrangements: Non-relatives/Friends Available Help at Discharge: Friend(s);Available PRN/intermittently Type of Home: House Home Access: Level entry     Home Layout: Two level Home Equipment: Cane - single point      Prior Function Level of Independence: Independent         Comments: Pt reports independence prior to onset of weakness/numbness. Has since had falls. Has been scooting up the steps.      Hand Dominance        Extremity/Trunk Assessment   Upper Extremity Assessment Upper Extremity Assessment: Defer to OT evaluation(very weak grip strength.)    Lower Extremity Assessment Lower Extremity Assessment: RLE deficits/detail;LLE deficits/detail RLE Deficits / Details: Able to feel some sensation from foot up to thigh, but reports less than normal. Hip flexors 2-/5, knee extensors 3+/5, ankle PF/DF 3/5.   LLE Deficits / Details: Able to feel some sensation from foot up to mid thigh, but reports less than normal. At lateral hip reports complete numbness. Reports burning at  L hip with movement. Hip flexors 2-/5, knee extensors 3+/5, ankle PF/DF 3/5.      Cervical / Trunk Assessment Cervical / Trunk Assessment: Other exceptions Cervical / Trunk Exceptions: Reports complete numbness in trunk.  Communication   Communication: No difficulties  Cognition Arousal/Alertness: Awake/alert Behavior During Therapy: WFL for tasks assessed/performed Overall Cognitive Status: Within Functional Limits for tasks assessed                                        General Comments      Exercises      Assessment/Plan    PT Assessment Patient needs continued PT services  PT Problem List Decreased strength;Decreased balance;Decreased mobility;Decreased knowledge of use of DME;Decreased coordination;Impaired sensation       PT Treatment Interventions Gait training;DME instruction;Stair training;Functional mobility training;Therapeutic activities;Therapeutic exercise;Balance training;Neuromuscular re-education;Patient/family education    PT Goals (Current goals can be found in the Care Plan section)  Acute Rehab PT Goals Patient Stated Goal: to get stronger PT Goal Formulation: With patient Time For Goal Achievement: 06/29/19 Potential to Achieve Goals: Good    Frequency Min 3X/week   Barriers to discharge        Co-evaluation               AM-PAC PT "6 Clicks" Mobility  Outcome Measure Help needed turning from your back to your side while in a flat bed without using bedrails?: A Little Help needed moving from lying on your back to sitting on the side of a flat bed without using bedrails?: A Little Help needed moving to and from a bed to a chair (including a wheelchair)?: A Lot Help needed standing up from a chair using your arms (e.g., wheelchair or bedside chair)?: A Lot Help needed to walk in hospital room?: A Lot Help needed climbing 3-5 steps with a railing? : Total 6 Click Score: 13    End of Session Equipment Utilized During Treatment: Gait belt Activity Tolerance: Patient tolerated treatment well Patient left: in bed;with call bell/phone within reach Nurse Communication: Mobility status PT Visit Diagnosis: Muscle weakness (generalized) (M62.81);Unsteadiness on feet (R26.81);History of falling (Z91.81)    Time: 7824-2353 PT Time Calculation (min) (ACUTE ONLY): 18 min   Charges:   PT Evaluation $PT Eval Moderate Complexity: 1 Mod          Farley Ly, PT, DPT  Acute Rehabilitation Services  Pager: [phone] Office: [phone]   Lehman Prom 06/15/2019, 1:49 PM
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
## 2019-06-15 NOTE — ED Notes (Signed)
Transported to MRI

## 2019-06-15 NOTE — ED Provider Notes (Addendum)
  1204-- Patient arrives from [facility] for neurology consultation.  Has had progressive weakness for about 2 months.  He describes it was started in one foot, progressed up the leg to the face, then back down the other side.  States he feels like his legs have atrophied and now he has trouble walking.  States his entire torso is numb but other areas of his body with varying degrees of numbness.  Denies pain, headaches, fever.  Has been off anticoagulation for about a year now.  Neurology paged, will come and evaluate.  Neurology, Dr. Otelia Limes has evaluated-- recommends LP.  We will discuss plans once resulted.   Physical Exam  BP (!) 145/90   Pulse 70   Temp 98.4 F (36.9 C)   Resp 18   SpO2 95%   Physical Exam  ED Course/Procedures     .Lumbar Puncture  Date/Time: 06/15/2019 2:59 AM Performed by: Garlon Hatchet, PA-C Authorized by: Garlon Hatchet, PA-C   Consent:    Consent obtained:  Verbal   Consent given by:  Patient   Risks discussed:  Bleeding, headache and infection   Alternatives discussed:  No treatment and delayed treatment Pre-procedure details:    Procedure purpose:  Diagnostic   Preparation: Patient was prepped and draped in usual sterile fashion   Anesthesia (see MAR for exact dosages):    Anesthesia method:  Local infiltration   Local anesthetic:  Lidocaine 1% w/o epi Procedure details:    Lumbar space:  L3-L4 interspace   Patient position:  Sitting   Needle gauge:  20   Needle type:  Spinal needle - Quincke tip   Needle length (in):  2.5   Ultrasound guidance: no     Number of attempts:  1   Fluid appearance:  Clear   Tubes of fluid:  4   Total volume (ml):  4 Post-procedure:    Puncture site:  Adhesive bandage applied and direct pressure applied   Patient tolerance of procedure:  Tolerated well, no immediate complications    CRITICAL CARE Performed by: Garlon Hatchet   Total critical care time: 40 minutes  Critical care time was exclusive of  separately billable procedures and treating other patients.  Critical care was necessary to treat or prevent imminent or life-threatening deterioration.  Critical care was time spent personally by me on the following activities: development of treatment plan with patient and/or surrogate as well as nursing, discussions with consultants, evaluation of patient's response to treatment, examination of patient, obtaining history from patient or surrogate, ordering and performing treatments and interventions, ordering and review of laboratory studies, ordering and review of radiographic studies, pulse oximetry and re-evaluation of patient's condition.   MDM   5:30 AM Discussed LP results with Dr. Otelia Limes-- protein is mildly elevated at 49.  Given this, increased likelihood of guillian barre but we will proceed with MRI's of the spine to ensure no demyelinating disease such as MS-- he is ordering now.  Oligoclonal bands CSF still pending along with IgG.  Patient will be admitted to medical service for speech eval along with PT/OT.  Patient was made aware of LP findings and need for admission, he is agreeable.  Discussed with hospitalist, Dr. Leafy Half-- he will admit for ongoing care.      Garlon Hatchet, PA-C 06/15/19 0536    Garlon Hatchet, PA-C 06/15/19 0541    Ward, Layla Maw, DO 06/15/19 [phone]

## 2019-06-15 NOTE — Progress Notes (Signed)
Patient seen this morning by Dr. Cheral Marker.  He has a subacute progressive sensorimotor neuropathy, predominantly sensory however.  He has some proximal greater than distal weakness in the legs though involving his hands.  He denies alcohol use.  LP with borderline protein  This is essentially a subacute neuropathy versus polyradiculoneuropathy and the differential includes metabolic/nutritional, inflammatory and autoimmune causes(including Guillian-Barr variant).  1) B12, vitamin E, B1, SPEP 2) CRP, ESR, SSA/B, autoimmune neuropathy panel 3) after these are drawn, I would consider empiric IVIG as it will take quite some time for the autoimmune labs to come back.  Roland Rack, MD [facility] [phone]  If 7pm- 7am, please page neurology on call as listed in [place].

## 2019-06-15 NOTE — Progress Notes (Signed)
Pt. Performed >-40 on the NIF and 2.5L on the vital capacity with good effort.

## 2019-06-15 NOTE — ED Notes (Signed)
Neuro at bedside.

## 2019-06-16 ENCOUNTER — Encounter (HOSPITAL_COMMUNITY): Payer: Self-pay | Admitting: Internal Medicine

## 2019-06-16 DIAGNOSIS — R739 Hyperglycemia, unspecified: Secondary | ICD-10-CM

## 2019-06-16 LAB — CBC WITH DIFFERENTIAL/PLATELET
Abs Immature Granulocytes: 0 10*3/uL (ref 0.00–0.07)
Basophils Absolute: 0 10*3/uL (ref 0.0–0.1)
Basophils Relative: 0 %
Eosinophils Absolute: 0 10*3/uL (ref 0.0–0.5)
Eosinophils Relative: 0 %
HCT: 35.7 % — ABNORMAL LOW (ref 39.0–52.0)
Hemoglobin: 12.2 g/dL — ABNORMAL LOW (ref 13.0–17.0)
Lymphocytes Relative: 14 %
Lymphs Abs: 1.1 10*3/uL (ref 0.7–4.0)
MCH: 37.8 pg — ABNORMAL HIGH (ref 26.0–34.0)
MCHC: 34.2 g/dL (ref 30.0–36.0)
MCV: 110.5 fL — ABNORMAL HIGH (ref 80.0–100.0)
Monocytes Absolute: 0.1 10*3/uL (ref 0.1–1.0)
Monocytes Relative: 1 %
Neutro Abs: 6.9 10*3/uL (ref 1.7–7.7)
Neutrophils Relative %: 85 %
Platelets: 157 10*3/uL (ref 150–400)
RBC: 3.23 MIL/uL — ABNORMAL LOW (ref 4.22–5.81)
RDW: 14.4 % (ref 11.5–15.5)
WBC: 8.1 10*3/uL (ref 4.0–10.5)
nRBC: 0 % (ref 0.0–0.2)
nRBC: 0 /100 WBC

## 2019-06-16 LAB — COMPREHENSIVE METABOLIC PANEL
ALT: 15 U/L (ref 0–44)
AST: 23 U/L (ref 15–41)
Albumin: 2.9 g/dL — ABNORMAL LOW (ref 3.5–5.0)
Alkaline Phosphatase: 66 U/L (ref 38–126)
Anion gap: 10 (ref 5–15)
BUN: 13 mg/dL (ref 6–20)
CO2: 20 mmol/L — ABNORMAL LOW (ref 22–32)
Calcium: 8.5 mg/dL — ABNORMAL LOW (ref 8.9–10.3)
Chloride: 102 mmol/L (ref 98–111)
Creatinine, Ser: 0.88 mg/dL (ref 0.61–1.24)
GFR calc Af Amer: >60 mL/min (ref >60)
GFR calc non Af Amer: >60 mL/min (ref >60)
Glucose, Bld: 103 mg/dL — ABNORMAL HIGH (ref 70–99)
Potassium: 3.6 mmol/L (ref 3.5–5.1)
Sodium: 132 mmol/L — ABNORMAL LOW (ref 135–145)
Total Bilirubin: 1.8 mg/dL — ABNORMAL HIGH (ref 0.3–1.2)
Total Protein: 6.7 g/dL (ref 6.5–8.1)

## 2019-06-16 LAB — MISC LABCORP TEST (SEND OUT): Labcorp test code: 2007966

## 2019-06-16 LAB — URINE CULTURE: Culture: NO GROWTH

## 2019-06-16 LAB — SJOGRENS SYNDROME-B EXTRACTABLE NUCLEAR ANTIBODY: SSB (La) (ENA) Antibody, IgG: <0.2 AI (ref 0.0–0.9)

## 2019-06-16 LAB — MAGNESIUM: Magnesium: 1.5 mg/dL — ABNORMAL LOW (ref 1.7–2.4)

## 2019-06-16 LAB — HEMOGLOBIN A1C
Hgb A1c MFr Bld: 5.5 % (ref 4.8–5.6)
Mean Plasma Glucose: 111 mg/dL

## 2019-06-16 LAB — SJOGRENS SYNDROME-A EXTRACTABLE NUCLEAR ANTIBODY: SSA (Ro) (ENA) Antibody, IgG: <0.2 AI (ref 0.0–0.9)

## 2019-06-16 MED ORDER — ENSURE ENLIVE PO LIQD
237.0000 mL | Freq: Two times a day (BID) | ORAL | Status: DC
  Administered 2019-06-16 – 2019-06-22 (×8): 237 mL via ORAL

## 2019-06-16 MED ORDER — DEXTROSE 5 % IV SOLN
Freq: Once | INTRAVENOUS | Status: AC
  Administered 2019-06-16: 50 mL via INTRAVENOUS

## 2019-06-16 NOTE — ED Notes (Signed)
Breakfast ordered 

## 2019-06-16 NOTE — ED Notes (Signed)
Lunch Tray Ordered@ 1025. 

## 2019-06-16 NOTE — Progress Notes (Signed)
Pt performed NIF and VC with good effort. Pt stated that he did feel a little stronger today, though it comes and goes.  VC 1.5L NIF -40

## 2019-06-16 NOTE — Progress Notes (Signed)
PROGRESS NOTE  Bryce Mendoza  GSU:[id number] DOB: Dec 19, 1960 DOA: 06/14/2019 PCP: Clent Demark, PA-C   Brief Narrative: Bryce Mendoza is a 59 y.o. male with a history of DVT/PE and empyema in 2019 who presented to [facility] ED 4/13 with several months of sensory changes in feet advancing to include the legs, hands, arms, and most recently the abdomen characterized as numbness and more recently including weakness in an ascending pattern. Due to concern for ascending paralysis, ED > ED transfer was performed for urgent neurology consultation at [facility] performed, MRI showed no explanation for findings. Further blood work has been sent and is pending. Neurology has initiated IVIG x5 days for subacute neuropathy versus polyradiculoneuropathy.   Assessment & Plan: Principal Problem:   Ascending paralysis ([place]) Active Problems:   Hyperglycemia   Bilirubinuria   Dupuytren contracture  Subacute neuropathy versus polyradiculoneuropathy:  - Appreciate neurology recommendations, continuing IVIG (4/14 - 4/18).  - Autoimmune neuropathy panel labs pending, though B12 wnl, ESR, CRP not elevated.  - Continue serial FVC, NIF which have been reassuring to date.  - PT/OT to evaluate.   Hyperglycemia: HbA1c 5.5%. - Would start SSI if steroids initiated.  Hypomagnesemia:  - Supplement and monitor.  Macrocytic anemia: Mild. B12 wnl.  - Folate previously low-normal  Hepatic steatosis, hyperbilirubinemia:  - Fractionate bilirubin  DVT prophylaxis: Lovenox Code Status: Full Family Communication: None at bedside Disposition Plan:  Status is: Inpatient  Remains inpatient appropriate because:IV treatments appropriate due to intensity of illness or inability to take PO and Inpatient level of care appropriate due to severity of illness   Dispo: The patient is from: Home              Anticipated d/c is to: TBD              Anticipated d/c date is: > 3 days              Patient currently is not medically  stable to d/c.  Consultants:   Neurology  Procedures:   Lumbar puncture 06/15/2019  MRI:  IMPRESSION: 1. No acute findings or explanation for the patient's symptoms. 2. The spinal cord appears normal without evidence of myelopathy, abnormal intradural enhancement or compression. 3. Mild spondylosis as described. At C5-6, there is resulting mild spinal stenosis with moderate right-greater-than-left foraminal narrowing. 4. No other significant spinal stenosis or nerve root encroachment. 5. Facet arthropathy in the cervical and lumbar spine as detailed above.  Antimicrobials:  None   Subjective: No major changes, barely able to get around room by holding on to furniture, numbness to belly button down unchanged.  Objective: Vitals:   06/16/19 0223 06/16/19 0853 06/16/19 1237 06/16/19 1500  BP: 128/78 103/82 (!) 130/105 138/88  Pulse:  84 82 86  Resp: _0 Temp:  98.1 F (36.7 C) 98.9 F (37.2 C) 98.5 F (36.9 C)  TempSrc:  Oral Oral Oral  SpO2: 97% 100% 100% 98%  Weight:      Height:        Intake/Output Summary (Last 24 hours) at 06/16/2019 1656 Last data filed at 06/16/2019 1313 Gross per 24 hour  Intake 1289.77 ml  Output 400 ml  Net 889.77 ml   Filed Weights   06/15/19 0915  Weight: 113.4 kg    Gen: 60 y.o. male in no distress  Pulm: Non-labored breathing room air. Clear to auscultation bilaterally.  CV: Regular rate and rhythm. No murmur, rub, or gallop. No JVD, no  pedal edema. GI: Abdomen soft, non-tender, non-distended, with normoactive bowel sounds. No organomegaly or masses felt. Ext: Warm, no deformities Skin: No rashes, lesions or ulcers Neuro: Alert and oriented. Sensory deficit dense from abdomen extending inferiorly, apraxia, weakness on hands, elbows > shoulders, 4/5 hips, 3/5 toe extension. Psych: Judgement and insight appear normal. Mood & affect appropriate.   Data Reviewed: I have personally reviewed following labs and imaging  studies  CBC: Recent Labs  Lab 06/14/19 1752 06/15/19 0840 06/16/19 0711  WBC 8.9 8.5 8.1  NEUTROABS 5.3 4.8 6.9  HGB 15.1 13.6 12.2*  HCT 44.1 39.4 35.7*  MCV 110.5* 111.3* 110.5*  PLT 221 207 329   Basic Metabolic Panel: Recent Labs  Lab 06/14/19 1752 06/15/19 0840 06/16/19 0711  NA 138 136 132*  K 3.6 4.0 3.6  CL 103 104 102  CO2 22 21* 20*  GLUCOSE 114* 99 103*  BUN _0 CREATININE 0.89 0.87 0.88  CALCIUM 8.9 8.6* 8.5*  MG  --   --  1.5*  PHOS  --  4.3  --    GFR: Estimated Creatinine Clearance: 121.8 mL/min (by C-G formula based on SCr of 0.88 mg/dL). Liver Function Tests: Recent Labs  Lab 06/14/19 1752 06/15/19 0840 06/16/19 0711  AST 35  --  23  ALT 23  --  15  ALKPHOS 83  --  66  BILITOT 2.3*  --  1.8*  PROT 7.4  --  6.7  ALBUMIN 3.7 3.2* 2.9*   No results for input(s): LIPASE, AMYLASE in the last 168 hours. No results for input(s): AMMONIA in the last 168 hours. Coagulation Profile: Recent Labs  Lab 06/15/19 0840  INR 1.2   Cardiac Enzymes: Recent Labs  Lab 06/15/19 0840  CKTOTAL 58   BNP (last 3 results) No results for input(s): PROBNP in the last 8760 hours. HbA1C: Recent Labs    06/15/19 0840  HGBA1C 5.5   CBG: No results for input(s): GLUCAP in the last 168 hours. Lipid Profile: No results for input(s): CHOL, HDL, LDLCALC, TRIG, CHOLHDL, LDLDIRECT in the last 72 hours. Thyroid Function Tests: Recent Labs    06/14/19 1752  TSH 1.677   Anemia Panel: Recent Labs    06/15/19 1559  VITAMINB12 457   Urine analysis:    Component Value Date/Time   COLORURINE RED (A) 06/14/2019 1929   APPEARANCEUR TURBID (A) 06/14/2019 1929   LABSPEC 1.027 06/14/2019 1929   PHURINE 5.0 06/14/2019 1929   GLUCOSEU NEGATIVE 06/14/2019 [place] NEGATIVE 06/14/2019 1929   BILIRUBINUR SMALL (A) 06/14/2019 [place] NEGATIVE 06/14/2019 1929   PROTEINUR 30 (A) 06/14/2019 1929   NITRITE NEGATIVE 06/14/2019 1929   LEUKOCYTESUR  NEGATIVE 06/14/2019 1929   Recent Results (from the past 240 hour(s))  Culture, blood (routine x 2)     Status: None (Preliminary result)   Collection Time: 06/15/19  1:15 AM   Specimen: BLOOD  Result Value Ref Range Status   Specimen Description BLOOD RIGHT HAND  Final   Special Requests   Final    BOTTLES DRAWN AEROBIC AND ANAEROBIC Blood Culture results may not be optimal due to an inadequate volume of blood received in culture bottles   Culture   Final    NO GROWTH 1 DAY Performed at [facility], [place] [address] [place], [place] [address]    Report Status PENDING  Incomplete  Culture, blood (routine x 2)     Status: None (Preliminary  result)   Collection Time: 06/15/19  1:47 AM   Specimen: BLOOD  Result Value Ref Range Status   Specimen Description BLOOD LEFT ARM  Final   Special Requests   Final    BOTTLES DRAWN AEROBIC AND ANAEROBIC Blood Culture adequate volume   Culture   Final    NO GROWTH 1 DAY Performed at [facility], [address] [place], [place] [address]    Report Status PENDING  Incomplete  CSF culture     Status: None (Preliminary result)   Collection Time: 06/15/19  2:53 AM   Specimen: CSF; Cerebrospinal Fluid  Result Value Ref Range Status   Specimen Description CSF  Final   Special Requests NONE  Final   Gram Stain CYTOSPIN SMEAR NO WBC SEEN NO ORGANISMS SEEN   Final   Culture   Final    NO GROWTH 1 DAY Performed at [facility], [place] [address] [place], [place] [address]    Report Status PENDING  Incomplete  SARS CORONAVIRUS 2 (TAT 6-24 HRS) Nasopharyngeal Nasopharyngeal Swab     Status: None   Collection Time: 06/15/19  5:24 AM   Specimen: Nasopharyngeal Swab  Result Value Ref Range Status   SARS Coronavirus 2 NEGATIVE NEGATIVE Final    Comment: (NOTE) SARS-CoV-2 target nucleic acids are NOT DETECTED. The SARS-CoV-2 RNA is generally detectable in upper and lower respiratory specimens during the acute phase of infection.  Negative results do not preclude SARS-CoV-2 infection, do not rule out co-infections with other pathogens, and should not be used as the sole basis for treatment or other patient management decisions. Negative results must be combined with clinical observations, patient history, and epidemiological information. The expected result is Negative. Fact Sheet for Patients: SugarRoll.be Fact Sheet for Healthcare Providers: https://www.woods-mathews.com/ This test is not yet approved or cleared by the [place] FDA and  has been authorized for detection and/or diagnosis of SARS-CoV-2 by FDA under an Emergency Use Authorization (EUA). This EUA will remain  in effect (meaning this test can be used) for the duration of the COVID-19 declaration under Section 56 4(b)(1) of the Act, 21 U.S.C. section 360bbb-3(b)(1), unless the authorization is terminated or revoked sooner. Performed at [facility], [place] [address] [place], [place] [address]   Culture, Urine     Status: None   Collection Time: 06/15/19  6:08 AM   Specimen: Urine, Random  Result Value Ref Range Status   Specimen Description URINE, RANDOM  Final   Special Requests NONE  Final   Culture   Final    NO GROWTH Performed at [facility], [place] [address], [place], [place] [address]    Report Status 06/16/2019 FINAL  Final      Radiology Studies: CT Head Wo Contrast  Result Date: 06/14/2019 CLINICAL DATA:  Ataxia rule out stroke EXAM: CT HEAD WITHOUT CONTRAST TECHNIQUE: Contiguous axial images were obtained from the base of the skull through the vertex without intravenous contrast. COMPARISON:  None. FINDINGS: Brain: No evidence of acute infarction, hemorrhage, hydrocephalus, extra-axial collection or mass lesion/mass effect. Mild atrophy. Vascular: Negative for hyperdense vessel Skull: Negative Sinuses/Orbits: Negative Other: None IMPRESSION: Mild atrophy.  No acute abnormality.  Electronically Signed   By: Franchot Gallo M.D.   On: 06/14/2019 20:11   MR CERVICAL SPINE W WO CONTRAST  Result Date: 06/15/2019 CLINICAL DATA:  Progressive weakness over the last 2 months. Suspected myelopathy. History of empyema and DVT. EXAM: MRI CERVICAL,  THORACIC AND LUMBAR SPINE WITHOUT AND WITH CONTRAST TECHNIQUE: Multiplanar and multiecho pulse sequences of the cervical spine, to include the craniocervical junction and cervicothoracic junction, and thoracic and lumbar spine, were obtained without and with intravenous contrast. CONTRAST:  21m GADAVIST GADOBUTROL 1 MMOL/ML IV SOLN COMPARISON:  CT chest 05/16/2019. FINDINGS: MRI CERVICAL SPINE FINDINGS Alignment: Straightening without focal angulation or significant listhesis. Vertebrae: No acute or suspicious osseous findings. Cord: Normal in signal and caliber. No abnormal intradural enhancement. Posterior Fossa, vertebral arteries, paraspinal tissues: Visualized portions of the posterior fossa and paraspinal soft tissues appear unremarkable. Bilateral vertebral artery flow voids. Disc levels: C2-3: The disc appears normal. Asymmetric facet hypertrophy on the left contributes to mild left foraminal narrowing. C3-4: Mild bilateral facet hypertrophy. No spinal stenosis or nerve root encroachment. C4-5: Small right paracentral disc protrusion and mild bilateral facet hypertrophy. No cord deformity or foraminal compromise. C5-6: Spondylosis with loss of disc height, posterior osteophytes and bilateral uncinate spurring. The CSF surrounding the cord is largely effaced without cord deformity. Moderate right-greater-than-left foraminal narrowing could affect the exiting C6 nerve roots. C6-7: Spondylosis with a shallow left paracentral disc protrusion and bilateral uncinate spurring. No cord deformity. Mild left greater than right foraminal narrowing. C7-T1: Normal interspace. MRI THORACIC SPINE FINDINGS Alignment: Normal. Vertebrae: No acute or suspicious  osseous findings. Cord: The thoracic cord is normal in signal and caliber. There is no abnormal intradural enhancement. The thoracic spinal canal is widely patent. Paraspinal and other soft tissues: No significant paraspinal abnormalities. Subpleural scarring posteriorly in the left lower lobe appears similar to previous CT. Disc levels: There are only mild degenerative changes within the thoracic spine with disc bulging greatest at T2-3. No cord deformity or significant foraminal narrowing. MRI LUMBAR SPINE FINDINGS Segmentation: There are 5 lumbar type vertebral bodies. Alignment: Normal. Vertebrae: No worrisome osseous lesion, acute fracture or pars defect. The visualized sacroiliac joints appear unremarkable. Conus medullaris: Extends to the L1 level and appears normal. No abnormal intradural enhancement. Paraspinal and other soft tissues: No significant paraspinal findings. Mildly prominent epidural fat in the lower lumbar spinal canal. Disc levels: No significant disc space findings at L1-2 or L2-3. L3-4: Preserved disc height with mild disc bulging eccentric to the left. Mild facet and ligamentous hypertrophy. No spinal stenosis or nerve root encroachment. L4-5: Preserved disc height with mild disc bulging. Moderate facet and ligamentous hypertrophy. No significant spinal stenosis or nerve root encroachment. L5-S1: Chronic loss of disc height with annular disc bulging and endplate osteophytes. Mild bilateral facet hypertrophy. Minimal inferior foraminal narrowing bilaterally without nerve root encroachment. IMPRESSION: 1. No acute findings or explanation for the patient's symptoms. 2. The spinal cord appears normal without evidence of myelopathy, abnormal intradural enhancement or compression. 3. Mild spondylosis as described. At C5-6, there is resulting mild spinal stenosis with moderate right-greater-than-left foraminal narrowing. 4. No other significant spinal stenosis or nerve root encroachment. 5. Facet  arthropathy in the cervical and lumbar spine as detailed above. Electronically Signed   By: WRichardean SaleM.D.   On: 06/15/2019 08:27   MR THORACIC SPINE W WO CONTRAST  Result Date: 06/15/2019 CLINICAL DATA:  Progressive weakness over the last 2 months. Suspected myelopathy. History of empyema and DVT. EXAM: MRI CERVICAL, THORACIC AND LUMBAR SPINE WITHOUT AND WITH CONTRAST TECHNIQUE: Multiplanar and multiecho pulse sequences of the cervical spine, to include the craniocervical junction and cervicothoracic junction, and thoracic and lumbar spine, were obtained without and with intravenous contrast. CONTRAST:  163mGADAVIST GADOBUTROL 1  MMOL/ML IV SOLN COMPARISON:  CT chest 05/16/2019. FINDINGS: MRI CERVICAL SPINE FINDINGS Alignment: Straightening without focal angulation or significant listhesis. Vertebrae: No acute or suspicious osseous findings. Cord: Normal in signal and caliber. No abnormal intradural enhancement. Posterior Fossa, vertebral arteries, paraspinal tissues: Visualized portions of the posterior fossa and paraspinal soft tissues appear unremarkable. Bilateral vertebral artery flow voids. Disc levels: C2-3: The disc appears normal. Asymmetric facet hypertrophy on the left contributes to mild left foraminal narrowing. C3-4: Mild bilateral facet hypertrophy. No spinal stenosis or nerve root encroachment. C4-5: Small right paracentral disc protrusion and mild bilateral facet hypertrophy. No cord deformity or foraminal compromise. C5-6: Spondylosis with loss of disc height, posterior osteophytes and bilateral uncinate spurring. The CSF surrounding the cord is largely effaced without cord deformity. Moderate right-greater-than-left foraminal narrowing could affect the exiting C6 nerve roots. C6-7: Spondylosis with a shallow left paracentral disc protrusion and bilateral uncinate spurring. No cord deformity. Mild left greater than right foraminal narrowing. C7-T1: Normal interspace. MRI THORACIC SPINE  FINDINGS Alignment: Normal. Vertebrae: No acute or suspicious osseous findings. Cord: The thoracic cord is normal in signal and caliber. There is no abnormal intradural enhancement. The thoracic spinal canal is widely patent. Paraspinal and other soft tissues: No significant paraspinal abnormalities. Subpleural scarring posteriorly in the left lower lobe appears similar to previous CT. Disc levels: There are only mild degenerative changes within the thoracic spine with disc bulging greatest at T2-3. No cord deformity or significant foraminal narrowing. MRI LUMBAR SPINE FINDINGS Segmentation: There are 5 lumbar type vertebral bodies. Alignment: Normal. Vertebrae: No worrisome osseous lesion, acute fracture or pars defect. The visualized sacroiliac joints appear unremarkable. Conus medullaris: Extends to the L1 level and appears normal. No abnormal intradural enhancement. Paraspinal and other soft tissues: No significant paraspinal findings. Mildly prominent epidural fat in the lower lumbar spinal canal. Disc levels: No significant disc space findings at L1-2 or L2-3. L3-4: Preserved disc height with mild disc bulging eccentric to the left. Mild facet and ligamentous hypertrophy. No spinal stenosis or nerve root encroachment. L4-5: Preserved disc height with mild disc bulging. Moderate facet and ligamentous hypertrophy. No significant spinal stenosis or nerve root encroachment. L5-S1: Chronic loss of disc height with annular disc bulging and endplate osteophytes. Mild bilateral facet hypertrophy. Minimal inferior foraminal narrowing bilaterally without nerve root encroachment. IMPRESSION: 1. No acute findings or explanation for the patient's symptoms. 2. The spinal cord appears normal without evidence of myelopathy, abnormal intradural enhancement or compression. 3. Mild spondylosis as described. At C5-6, there is resulting mild spinal stenosis with moderate right-greater-than-left foraminal narrowing. 4. No other  significant spinal stenosis or nerve root encroachment. 5. Facet arthropathy in the cervical and lumbar spine as detailed above. Electronically Signed   By: Richardean Sale M.D.   On: 06/15/2019 08:27   MR Lumbar Spine W Wo Contrast  Result Date: 06/15/2019 CLINICAL DATA:  Progressive weakness over the last 2 months. Suspected myelopathy. History of empyema and DVT. EXAM: MRI CERVICAL, THORACIC AND LUMBAR SPINE WITHOUT AND WITH CONTRAST TECHNIQUE: Multiplanar and multiecho pulse sequences of the cervical spine, to include the craniocervical junction and cervicothoracic junction, and thoracic and lumbar spine, were obtained without and with intravenous contrast. CONTRAST:  70m GADAVIST GADOBUTROL 1 MMOL/ML IV SOLN COMPARISON:  CT chest 05/16/2019. FINDINGS: MRI CERVICAL SPINE FINDINGS Alignment: Straightening without focal angulation or significant listhesis. Vertebrae: No acute or suspicious osseous findings. Cord: Normal in signal and caliber. No abnormal intradural enhancement. Posterior Fossa, vertebral arteries, paraspinal tissues:  Visualized portions of the posterior fossa and paraspinal soft tissues appear unremarkable. Bilateral vertebral artery flow voids. Disc levels: C2-3: The disc appears normal. Asymmetric facet hypertrophy on the left contributes to mild left foraminal narrowing. C3-4: Mild bilateral facet hypertrophy. No spinal stenosis or nerve root encroachment. C4-5: Small right paracentral disc protrusion and mild bilateral facet hypertrophy. No cord deformity or foraminal compromise. C5-6: Spondylosis with loss of disc height, posterior osteophytes and bilateral uncinate spurring. The CSF surrounding the cord is largely effaced without cord deformity. Moderate right-greater-than-left foraminal narrowing could affect the exiting C6 nerve roots. C6-7: Spondylosis with a shallow left paracentral disc protrusion and bilateral uncinate spurring. No cord deformity. Mild left greater than right  foraminal narrowing. C7-T1: Normal interspace. MRI THORACIC SPINE FINDINGS Alignment: Normal. Vertebrae: No acute or suspicious osseous findings. Cord: The thoracic cord is normal in signal and caliber. There is no abnormal intradural enhancement. The thoracic spinal canal is widely patent. Paraspinal and other soft tissues: No significant paraspinal abnormalities. Subpleural scarring posteriorly in the left lower lobe appears similar to previous CT. Disc levels: There are only mild degenerative changes within the thoracic spine with disc bulging greatest at T2-3. No cord deformity or significant foraminal narrowing. MRI LUMBAR SPINE FINDINGS Segmentation: There are 5 lumbar type vertebral bodies. Alignment: Normal. Vertebrae: No worrisome osseous lesion, acute fracture or pars defect. The visualized sacroiliac joints appear unremarkable. Conus medullaris: Extends to the L1 level and appears normal. No abnormal intradural enhancement. Paraspinal and other soft tissues: No significant paraspinal findings. Mildly prominent epidural fat in the lower lumbar spinal canal. Disc levels: No significant disc space findings at L1-2 or L2-3. L3-4: Preserved disc height with mild disc bulging eccentric to the left. Mild facet and ligamentous hypertrophy. No spinal stenosis or nerve root encroachment. L4-5: Preserved disc height with mild disc bulging. Moderate facet and ligamentous hypertrophy. No significant spinal stenosis or nerve root encroachment. L5-S1: Chronic loss of disc height with annular disc bulging and endplate osteophytes. Mild bilateral facet hypertrophy. Minimal inferior foraminal narrowing bilaterally without nerve root encroachment. IMPRESSION: 1. No acute findings or explanation for the patient's symptoms. 2. The spinal cord appears normal without evidence of myelopathy, abnormal intradural enhancement or compression. 3. Mild spondylosis as described. At C5-6, there is resulting mild spinal stenosis with  moderate right-greater-than-left foraminal narrowing. 4. No other significant spinal stenosis or nerve root encroachment. 5. Facet arthropathy in the cervical and lumbar spine as detailed above. Electronically Signed   By: Richardean Sale M.D.   On: 06/15/2019 08:27   DG Chest Port 1 View  Result Date: 06/14/2019 CLINICAL DATA:  Weakness, fatigue EXAM: PORTABLE CHEST 1 VIEW COMPARISON:  10/29/2017 FINDINGS: Two frontal views of the chest demonstrate chronic scarring and pleural thickening at the left lung base. No acute airspace disease, effusion, or pneumothorax. Cardiac silhouette is unremarkable. IMPRESSION: 1. Chronic scarring and pleural thickening left lung base. 2. No acute process. Electronically Signed   By: Randa Ngo M.D.   On: 06/14/2019 18:57   US Abdomen Limited RUQ  Result Date: 06/15/2019 CLINICAL DATA:  Elevated LFTs EXAM: ULTRASOUND ABDOMEN LIMITED RIGHT UPPER QUADRANT COMPARISON:  None. FINDINGS: Gallbladder: No gallstones, gallbladder wall thickening, or pericholecystic fluid. Negative sonographic Murphy's sign. Common bile duct: Diameter: 5 mm Liver: Hyperechoic hepatic parenchyma, suggesting hepatic steatosis. No focal hepatic lesion is seen. Portal vein is patent on color Doppler imaging with normal direction of blood flow towards the liver. Other: None. IMPRESSION: Hepatic steatosis.  No focal  hepatic lesion is seen. Electronically Signed   By: Julian Hy M.D.   On: 06/15/2019 09:11    Scheduled Meds: . enoxaparin (LOVENOX) injection  40 mg Subcutaneous Q24H  . feeding supplement (ENSURE ENLIVE)  237 mL Oral BID BM   Continuous Infusions: . Immune Globulin 10% Stopped (06/16/19 0130)     LOS: 1 day   Time spent: 25 minutes.  Patrecia Pour, MD [facility] [email or web address] 06/16/2019, 4:56 PM

## 2019-06-16 NOTE — Progress Notes (Signed)
Subjective: No problems with IVIG  Exam: Vitals:   06/16/19 1237 06/16/19 1500  BP: (!) 130/105 138/88  Pulse: 82 86  Resp: 18 17  Temp: 98.9 F (37.2 C) 98.5 F (36.9 C)  SpO2: 100% 98%   Gen: In bed, NAD Resp: non-labored breathing, no acute distress Abd: soft, nt  Neuro: MS: awake, alert,  AJ:GOTLX, EOMI Motor: 4/5 hip flexors, 4+/5 distally. Mild APB  Sensory: Diminished to mid thigh bilaterally to temperature. DTR: Absent in the legs  Pertinent Labs: ESR, CRP-normal SSA, SSB-normal B12-457 CSF protein-49, normal cell count HIV-negative TSH-no Copper, SPEP with IFX, B1, vitamin E, autoimmune neuropathy panel-pending  Impression: 59 year old male with subacute predominantly sensory (though with some motor involvement) neuropathy.  Differential includes metabolic/nutritional versus autoimmune causes.  No clear evidence of nutritional/metabolic process thus far and therefore has been started on empiric IVIG.  Recommendations: 1) continue IVIG 2) PT, OT  Roland Rack, MD [facility] [phone]  If 7pm- 7am, please page neurology on call as listed in [place]. Patient seen this morning by Dr. Cheral Marker.  He has a subacute progressive sensorimotor neuropathy, predominantly sensory however.  He has some proximal greater than distal weakness in the legs though involving his hands.  He denies alcohol use.  LP with borderline protein  This is essentially a subacute neuropathy versus polyradiculoneuropathy and the differential includes metabolic/nutritional, inflammatory and autoimmune causes(including Guillian-Barr variant).  1) B12, vitamin E, B1, SPEP 2) CRP, ESR, SSA/B, autoimmune neuropathy panel 3) after these are drawn, I would consider empiric IVIG as it will take quite some time for the autoimmune labs to come back.  Roland Rack, MD [facility] [phone]  If 7pm- 7am, please page neurology on call as listed in  [place].

## 2019-06-17 LAB — BILIRUBIN, FRACTIONATED(TOT/DIR/INDIR)
Bilirubin, Direct: 0.2 mg/dL (ref 0.0–0.2)
Indirect Bilirubin: 0.8 mg/dL (ref 0.3–0.9)
Total Bilirubin: 1 mg/dL (ref 0.3–1.2)

## 2019-06-17 LAB — IGG CSF INDEX
Albumin CSF-mCnc: 29 mg/dL (ref 11–48)
Albumin: 3.6 g/dL — ABNORMAL LOW (ref 3.8–4.9)
CSF IgG Index: 0.5 (ref 0.0–0.7)
IgG (Immunoglobin G), Serum: 786 mg/dL (ref 603–1613)
IgG, CSF: 3.1 mg/dL (ref 0.0–8.6)
IgG/Alb Ratio, CSF: 0.11 (ref 0.00–0.25)

## 2019-06-17 LAB — BASIC METABOLIC PANEL
Anion gap: 9 (ref 5–15)
BUN: 10 mg/dL (ref 6–20)
CO2: 24 mmol/L (ref 22–32)
Calcium: 8.7 mg/dL — ABNORMAL LOW (ref 8.9–10.3)
Chloride: 102 mmol/L (ref 98–111)
Creatinine, Ser: 0.79 mg/dL (ref 0.61–1.24)
GFR calc Af Amer: >60 mL/min (ref >60)
GFR calc non Af Amer: >60 mL/min (ref >60)
Glucose, Bld: 83 mg/dL (ref 70–99)
Potassium: 3.2 mmol/L — ABNORMAL LOW (ref 3.5–5.1)
Sodium: 135 mmol/L (ref 135–145)

## 2019-06-17 LAB — PROTEIN ELECTROPHORESIS, SERUM
A/G Ratio: 1.2 (ref 0.7–1.7)
Albumin ELP: 3.3 g/dL (ref 2.9–4.4)
Alpha-1-Globulin: 0.3 g/dL (ref 0.0–0.4)
Alpha-2-Globulin: 0.7 g/dL (ref 0.4–1.0)
Beta Globulin: 1.2 g/dL (ref 0.7–1.3)
Gamma Globulin: 0.6 g/dL (ref 0.4–1.8)
Globulin, Total: 2.8 g/dL (ref 2.2–3.9)
Total Protein ELP: 6.1 g/dL (ref 6.0–8.5)

## 2019-06-17 LAB — FOLATE: Folate: 2.9 ng/mL — ABNORMAL LOW (ref >5.9)

## 2019-06-17 LAB — COPPER, SERUM: Copper: 116 ug/dL (ref 69–132)

## 2019-06-17 MED ORDER — ADULT MULTIVITAMIN W/MINERALS CH
1.0000 | ORAL_TABLET | Freq: Every day | ORAL | Status: DC
  Administered 2019-06-17 – 2019-06-21 (×5): 1 via ORAL
  Filled 2019-06-17 (×5): qty 1

## 2019-06-17 MED ORDER — POTASSIUM CHLORIDE CRYS ER 20 MEQ PO TBCR
40.0000 meq | EXTENDED_RELEASE_TABLET | Freq: Once | ORAL | Status: AC
  Administered 2019-06-17: 40 meq via ORAL
  Filled 2019-06-17: qty 2

## 2019-06-17 MED ORDER — FOLIC ACID 1 MG PO TABS
1.0000 mg | ORAL_TABLET | Freq: Every day | ORAL | Status: DC
  Administered 2019-06-17 – 2019-06-18 (×2): 1 mg via ORAL
  Filled 2019-06-17 (×2): qty 1

## 2019-06-17 NOTE — Progress Notes (Signed)
PT Progress Note for Charges    06/17/19 1700  PT Visit Information  Last PT Received On 06/17/19  PT General Charges  $$ ACUTE PT VISIT 1 Visit  PT Treatments  $Gait Training 23-37 mins  Arletta Bale, DPT  Acute Rehabilitation Services Pager [phone] Office [phone]

## 2019-06-17 NOTE — Progress Notes (Signed)
Pt performed Nif -40 and VC 1.5L.

## 2019-06-17 NOTE — Progress Notes (Signed)
Subjective: No changes  Exam: Vitals:   06/17/19 1831 06/17/19 1851  BP: 108/83 (!) 116/98  Pulse: 82 82  Resp: 16 18  Temp: 98.5 F (36.9 C) 98.6 F (37 C)  SpO2: 97% 97%   Gen: In bed, NAD Resp: non-labored breathing, no acute distress Abd: soft, nt  Neuro: MS: awake, alert,  RV:IFBPP, EOMI Motor: 4/5 hip flexors, 4+/5 distally. Mild APB weakness Sensory: Diminished to mid thigh bilaterally to temperature. DTR: Absent in the legs, preserved in the biceps  Pertinent Labs: ESR, CRP-normal SSA, SSB-normal B12-457 CSF protein-49, normal cell count HIV-negative TSH-no Copper, SPEP with IFX, B1, vitamin E, autoimmune neuropathy panel-pending  Impression: 59 year old male with subacute predominantly sensory (though with some motor involvement) neuropathy.  Differential includes metabolic/nutritional versus autoimmune causes.  No clear evidence of nutritional/metabolic process thus far and therefore has been started on empiric IVIG.  Recommendations: 1) continue IVIG 2) PT, OT  Roland Rack, MD [facility] [phone]  If 7pm- 7am, please page neurology on call as listed in [place]. Patient seen this morning by Dr. Cheral Marker.  He has a subacute progressive sensorimotor neuropathy, predominantly sensory however.  He has some proximal greater than distal weakness in the legs though involving his hands.

## 2019-06-17 NOTE — Progress Notes (Signed)
Rehab Admissions Coordinator Note:  Per OT recommendation, this patient was screened by Cheri Rous for appropriateness for an Inpatient Acute Rehab Consult.  At this time, we are recommending an Inpatient Rehab consult. AC will place consult order per protocol.   Cheri Rous 06/17/2019, 4:46 PM  I can be reached at [phone].

## 2019-06-17 NOTE — Progress Notes (Addendum)
PROGRESS NOTE  Bryce Mendoza  QBH:[id number] DOB: Jul 16, 1960 DOA: 06/14/2019 PCP: Clent Demark, PA-C   Brief Narrative: Bryce Mendoza is a 59 y.o. male with a history of DVT/PE and empyema in 2019 who presented to [facility] ED 4/13 with several months of sensory changes in feet advancing to include the legs, hands, arms, and most recently the abdomen characterized as numbness and more recently including weakness in an ascending pattern. Due to concern for ascending paralysis, ED > ED transfer was performed for urgent neurology consultation at [facility]. LP performed, MRI showed no explanation for findings. Further blood work has been sent and is pending. Neurology has initiated IVIG x5 days for subacute neuropathy versus polyradiculoneuropathy.   Assessment & Plan: Principal Problem:   Ascending paralysis ([place]) Active Problems:   Hyperglycemia   Bilirubinuria   Dupuytren contracture  Subacute polyneuropathy: Sensory > motor. ESR, CRP not elevated. SSA, SSB normal. Vitamin B12 normal at 457. HIV NR, TSH wnl, CSF protein 49, normal cell count.   - Appreciate neurology recommendations, continuing IVIG x5 days (4/14 - 4/18).  - Autoimmune neuropathy panel labs pending  - Continue serial FVC, NIF which remain reassuring. - PT/OT to evaluate.   Folic acid deficiency: Severe. Unclear what, if any association this has with neuropathy. - Supplement daily.   Hyperglycemia: HbA1c 5.5%. - Would start SSI if steroids initiated.  Hypokalemia:  - Supplement and continue monitoring.   Hypomagnesemia:  - Supplement and monitor.  Macrocytic anemia: Mild. B12 wnl.  - Folate previously low-normal  Hepatic steatosis, hyperbilirubinemia:  - Fractionated bilirubin normal on 4/16  DVT prophylaxis: Lovenox Code Status: DNR, confirmed with patient on 4/16. Patient would also wish to have his body donated for research. Family Communication: None at bedside Disposition Plan:  Status is: Inpatient  Remains  inpatient appropriate because:IV treatments appropriate due to intensity of illness or inability to take PO and Inpatient level of care appropriate due to severity of illness   Dispo: The patient is from: Home              Anticipated d/c is to: TBD              Anticipated d/c date is: > 3 days              Patient currently is not medically stable to d/c.  Consultants:   Neurology  Procedures:   Lumbar puncture 06/15/2019  MRI:  IMPRESSION: 1. No acute findings or explanation for the patient's symptoms. 2. The spinal cord appears normal without evidence of myelopathy, abnormal intradural enhancement or compression. 3. Mild spondylosis as described. At C5-6, there is resulting mild spinal stenosis with moderate right-greater-than-left foraminal narrowing. 4. No other significant spinal stenosis or nerve root encroachment. 5. Facet arthropathy in the cervical and lumbar spine as detailed above.  Antimicrobials:  None   Subjective: Having some awareness of changes/tightness in sensation across shoulders and into pec muscles bilaterally over past 24 hours, no significant improvement in numbness, mild weakness. Still furniture walking to bathroom but feels very unsteady.   Objective: Vitals:   06/16/19 2035 06/17/19 0000 06/17/19 0458 06/17/19 0915  BP: 115/83 138/84 133/81 128/80  Pulse: 72 72 72 74  Resp: '18 16 14 16  ' Temp: 98.1 F (36.7 C) 98.1 F (36.7 C) 98.1 F (36.7 C) 98.4 F (36.9 C)  TempSrc: Oral Oral Oral Oral  SpO2: 99% 98% 93% 94%  Weight:      Height:  Intake/Output Summary (Last 24 hours) at 06/17/2019 0923 Last data filed at 06/17/2019 0845 Gross per 24 hour  Intake 700 ml  Output 1250 ml  Net -550 ml   Filed Weights   06/15/19 0915  Weight: 113.4 kg   Gen: 59 y.o. male in no distress Pulm: Nonlabored breathing room air. Clear. CV: Regular rate and rhythm. No murmur, rub, or gallop. No JVD, no dependent edema. GI: Abdomen soft,  non-tender, non-distended, with normoactive bowel sounds.  Ext: Warm, no deformities Skin: No rashes, lesions or ulcers on visualized skin. Neuro: Alert and oriented. 4/5 strength in hip flexors, slightly stronger EHL, ankle dorsiflexion. Sensory deficits most dense in abdomen, also including lower extremities and distal upper extremities. Psych: Judgement and insight appear fair. Mood euthymic & affect congruent. Behavior is appropriate.    Data Reviewed: I have personally reviewed following labs and imaging studies  CBC: Recent Labs  Lab 06/14/19 1752 06/15/19 0840 06/16/19 0711  WBC 8.9 8.5 8.1  NEUTROABS 5.3 4.8 6.9  HGB 15.1 13.6 12.2*  HCT 44.1 39.4 35.7*  MCV 110.5* 111.3* 110.5*  PLT 221 207 224   Basic Metabolic Panel: Recent Labs  Lab 06/14/19 1752 06/15/19 0840 06/16/19 0711 06/17/19 0354  NA 138 136 132* 135  K 3.6 4.0 3.6 3.2*  CL 103 104 102 102  CO2 22 21* 20* 24  GLUCOSE 114* 99 103* 83  BUN '12 11 13 10  ' CREATININE 0.89 0.87 0.88 0.79  CALCIUM 8.9 8.6* 8.5* 8.7*  MG  --   --  1.5*  --   PHOS  --  4.3  --   --    GFR: Estimated Creatinine Clearance: 134 mL/min (by C-G formula based on SCr of 0.79 mg/dL). Liver Function Tests: Recent Labs  Lab 06/14/19 1752 06/15/19 0840 06/16/19 0711 06/17/19 0354  AST 35  --  23  --   ALT 23  --  15  --   ALKPHOS 83  --  66  --   BILITOT 2.3*  --  1.8* 1.0  PROT 7.4  --  6.7  --   ALBUMIN 3.7 3.2* 2.9*  --    No results for input(s): LIPASE, AMYLASE in the last 168 hours. No results for input(s): AMMONIA in the last 168 hours. Coagulation Profile: Recent Labs  Lab 06/15/19 0840  INR 1.2   Cardiac Enzymes: Recent Labs  Lab 06/15/19 0840  CKTOTAL 58   BNP (last 3 results) No results for input(s): PROBNP in the last 8760 hours. HbA1C: Recent Labs    06/15/19 0840  HGBA1C 5.5   CBG: No results for input(s): GLUCAP in the last 168 hours. Lipid Profile: No results for input(s): CHOL, HDL,  LDLCALC, TRIG, CHOLHDL, LDLDIRECT in the last 72 hours. Thyroid Function Tests: Recent Labs    06/14/19 1752  TSH 1.677   Anemia Panel: Recent Labs    06/15/19 1559 06/17/19 0354  VITAMINB12 457  --   FOLATE  --  2.9*   Urine analysis:    Component Value Date/Time   COLORURINE RED (A) 06/14/2019 1929   APPEARANCEUR TURBID (A) 06/14/2019 1929   LABSPEC 1.027 06/14/2019 1929   PHURINE 5.0 06/14/2019 1929   GLUCOSEU NEGATIVE 06/14/2019 1929   HGBUR NEGATIVE 06/14/2019 1929   BILIRUBINUR SMALL (A) 06/14/2019 [place] NEGATIVE 06/14/2019 1929   PROTEINUR 30 (A) 06/14/2019 1929   NITRITE NEGATIVE 06/14/2019 1929   LEUKOCYTESUR NEGATIVE 06/14/2019 1929   Recent Results (from the  past 240 hour(s))  Culture, blood (routine x 2)     Status: None (Preliminary result)   Collection Time: 06/15/19  1:15 AM   Specimen: BLOOD  Result Value Ref Range Status   Specimen Description BLOOD RIGHT HAND  Final   Special Requests   Final    BOTTLES DRAWN AEROBIC AND ANAEROBIC Blood Culture results may not be optimal due to an inadequate volume of blood received in culture bottles   Culture   Final    NO GROWTH 1 DAY Performed at [facility], [place] [address] [place], [place] [address]    Report Status PENDING  Incomplete  Culture, blood (routine x 2)     Status: None (Preliminary result)   Collection Time: 06/15/19  1:47 AM   Specimen: BLOOD  Result Value Ref Range Status   Specimen Description BLOOD LEFT ARM  Final   Special Requests   Final    BOTTLES DRAWN AEROBIC AND ANAEROBIC Blood Culture adequate volume   Culture   Final    NO GROWTH 1 DAY Performed at [facility], [place] [address] [place], [place] [address]    Report Status PENDING  Incomplete  CSF culture     Status: None (Preliminary result)   Collection Time: 06/15/19  2:53 AM   Specimen: CSF; Cerebrospinal Fluid  Result Value Ref Range Status   Specimen Description CSF  Final   Special Requests NONE   Final   Gram Stain CYTOSPIN SMEAR NO WBC SEEN NO ORGANISMS SEEN   Final   Culture   Final    NO GROWTH 2 DAYS Performed at [facility], [place] [address], [place], [place] [address]    Report Status PENDING  Incomplete  SARS CORONAVIRUS 2 (TAT 6-24 HRS) Nasopharyngeal Nasopharyngeal Swab     Status: None   Collection Time: 06/15/19  5:24 AM   Specimen: Nasopharyngeal Swab  Result Value Ref Range Status   SARS Coronavirus 2 NEGATIVE NEGATIVE Final    Comment: (NOTE) SARS-CoV-2 target nucleic acids are NOT DETECTED. The SARS-CoV-2 RNA is generally detectable in upper and lower respiratory specimens during the acute phase of infection. Negative results do not preclude SARS-CoV-2 infection, do not rule out co-infections with other pathogens, and should not be used as the sole basis for treatment or other patient management decisions. Negative results must be combined with clinical observations, patient history, and epidemiological information. The expected result is Negative. Fact Sheet for Patients: SugarRoll.be Fact Sheet for Healthcare Providers: https://www.woods-mathews.com/ This test is not yet approved or cleared by the [place] FDA and  has been authorized for detection and/or diagnosis of SARS-CoV-2 by FDA under an Emergency Use Authorization (EUA). This EUA will remain  in effect (meaning this test can be used) for the duration of the COVID-19 declaration under Section 56 4(b)(1) of the Act, 21 U.S.C. section 360bbb-3(b)(1), unless the authorization is terminated or revoked sooner. Performed at [facility], [place] [address], [place], [place] [address]   Culture, Urine     Status: None   Collection Time: 06/15/19  6:08 AM   Specimen: Urine, Random  Result Value Ref Range Status   Specimen Description URINE, RANDOM  Final   Special Requests NONE  Final   Culture   Final    NO GROWTH Performed at [facility], [place] [address], [place], [place] [address]    Report Status 06/16/2019 FINAL  Final  Radiology Studies: No results found.  Scheduled Meds: . enoxaparin (LOVENOX) injection  40 mg Subcutaneous Q24H  . feeding supplement (ENSURE ENLIVE)  237 mL Oral BID BM  . folic acid  1 mg Oral Daily   Continuous Infusions: . Immune Globulin 10% 0 mg/kg (06/16/19 0130)     LOS: 2 days   Time spent: 25 minutes.  Patrecia Pour, MD [facility] [email or web address] 06/17/2019, 9:23 AM

## 2019-06-17 NOTE — Progress Notes (Signed)
RT performed NIF/VC with patient. Patient got NIF -40 and VC 2.3L. Patient had really good effort.

## 2019-06-17 NOTE — Progress Notes (Signed)
Initial Nutrition Assessment  DOCUMENTATION CODES:   Obesity unspecified  INTERVENTION:   Ensure Enlive po BID, each supplement provides 350 kcal and 20 grams of protein   NUTRITION DIAGNOSIS:   Inadequate oral intake related to decreased appetite as evidenced by per patient/family report.  GOAL:   Patient will meet greater than or equal to 90% of their needs  MONITOR:   PO intake, Supplement acceptance, Labs, Weight trends  REASON FOR ASSESSMENT:   Malnutrition Screening Tool    ASSESSMENT:   59 yo male admitted with ascending paralysis with subacute neuropathy vs polyradiculoneuropathy. PMH includes DVT/PE, empyema  Recorded po intake 50-100%   Started on IVIG for 5 days  Folic acid deficiency; thiamine and Vit E pending  Vitamin Panel (06/2019):  Folate: 2.9 (L)  B-12: 457 (wdl) Copper: 116 (wdl) Thiamine : pending Vitamin E: pending  No weight loss per weight encounters  Labs: potassium 3.2 (L) Meds: folic acid, KCL  Diet Order:   Diet Order            Diet regular Room service appropriate? Yes; Fluid consistency: Thin  Diet effective now              EDUCATION NEEDS:   No education needs have been identified at this time  Skin:  Skin Assessment: Reviewed RN Assessment  Last BM:  4/14  Height:   Ht Readings from Last 1 Encounters:  06/15/19 6' (1.829 m)    Weight:   Wt Readings from Last 1 Encounters:  06/15/19 113.4 kg    BMI:  Body mass index is 33.91 kg/m.  Estimated Nutritional Needs:   Kcal:  2000-2200 kcals  Protein:  100-120 g  Fluid:  >/= 2 L   Romelle Starcher MS, RDN, LDN, CNSC RD Pager Number and Weekend/On-Call After Hours Pager Located in [place]

## 2019-06-17 NOTE — Progress Notes (Signed)
Physical Therapy Treatment Patient Details Name: Bryce Mendoza MRN: [id number] DOB: 09-Apr-1960 Today's Date: 06/17/2019    History of Present Illness Pt is a 59 y/o male admitted secondary to worsening weakness and numbness in BUE and BLE. Pt reports being completely numb in trunk. Work up pending; possible GB vs MS. PMH includes DVT, PE, and empyema of lung.     PT Comments    Patient is progressing with functional mobility and activity tolerance. He required modA for initial power up from EOB and progressed to minA with cues to scoot anteriorly and get feet underneath him. He was able to ambulate with heavy min guard assist for safety and use RW with close chair follow due to poor bil knee control and overall instability. No overt LOB noted but he required markedly inc time for swing and stance phases of gait for proper LE support. He is extremely reliant on UE support during mobility, requiring 2 standing rest breaks due to UE fatigue. He is unable to ambulate without significant UE support due to generalized LE weakness. He was previously scooting backwards or "primal walking" up the stairs of his home and using furniture to walk short distances. Educated pt on importance of skin checks and repositioning as he demonstrated impaired light touch sensation on bil LE's. Recommend d/c to CIR prior to returning home to maximize functional mobility, safety, and independence. Pt is very motivated to improve. Pt would continue to benefit from skilled physical therapy services at this time while admitted and after d/c to address the below listed limitations in order to improve overall safety and independence with functional mobility.   Follow Up Recommendations  CIR;Supervision for mobility/OOB     Equipment Recommendations  Other (comment)(defer to next venue of care)    Recommendations for Other Services       Precautions / Restrictions Precautions Precautions: Fall    Mobility  Bed  Mobility Overal bed mobility: Needs Assistance Bed Mobility: Supine to Sit     Supine to sit: Supervision     General bed mobility comments: supervision for safety, no physcial assist. Heavy use of bedrail  Transfers Overall transfer level: Needs assistance Equipment used: Rolling walker (2 wheeled) Transfers: Sit to/from Stand Sit to Stand: Mod assist;Min assist         General transfer comment: modA for initial power up due to L LE sliding anteriorly. minA ffor subsequent sit<>stands with cues to scoot anteriorly and get feet underneath  Ambulation/Gait Ambulation/Gait assistance: Min guard Gait Distance (Feet): 100 Feet(100' x2 with standing rest breaks ) Assistive device: Rolling walker (2 wheeled) Gait Pattern/deviations: Step-through pattern;Decreased stride length;Trunk flexed Gait velocity: markedly dec   General Gait Details: Extremely heavy reliance on Bil UE's with RW. Presents with slow swing phase, relying on instinct and plantar foot sensation for proper foot placement. VC to adduct arms and relax shoulders during ambulation. No LOB noted, 2 brief standing rest breaks due to UE fatigue   Stairs             Wheelchair Mobility    Modified Rankin (Stroke Patients Only)       Balance Overall balance assessment: Needs assistance Sitting-balance support: No upper extremity supported;Feet supported Sitting balance-Leahy Scale: Good     Standing balance support: Bilateral upper extremity supported;During functional activity Standing balance-Leahy Scale: Poor Standing balance comment: Heavy reliance on UE support  Cognition Arousal/Alertness: Awake/alert Behavior During Therapy: WFL for tasks assessed/performed Overall Cognitive Status: Within Functional Limits for tasks assessed                                        Exercises      General Comments General comments (skin integrity, edema,  etc.): Pt reported diminshed sensation mainly in abdominal area and "different/weird" sensation from anterior knee to dorsal toes. Reports improved sensation on plantar aspects of feet. Demonstrates impraired ROM of bil shoulders (flex & abd), not formally assessed.       Pertinent Vitals/Pain Pain Assessment: Faces Faces Pain Scale: Hurts a little bit Pain Location: back Pain Descriptors / Indicators: Discomfort Pain Intervention(s): Repositioned    Home Living                      Prior Function            PT Goals (current goals can now be found in the care plan section) Acute Rehab PT Goals Patient Stated Goal: to figure out what is going on PT Goal Formulation: With patient Time For Goal Achievement: 06/29/19 Potential to Achieve Goals: Good Progress towards PT goals: Progressing toward goals    Frequency    Min 3X/week      PT Plan Discharge plan needs to be updated    Co-evaluation              AM-PAC PT "6 Clicks" Mobility   Outcome Measure  Help needed turning from your back to your side while in a flat bed without using bedrails?: A Little Help needed moving from lying on your back to sitting on the side of a flat bed without using bedrails?: A Little Help needed moving to and from a bed to a chair (including a wheelchair)?: A Little Help needed standing up from a chair using your arms (e.g., wheelchair or bedside chair)?: A Little Help needed to walk in hospital room?: A Lot Help needed climbing 3-5 steps with a railing? : Total 6 Click Score: 15    End of Session Equipment Utilized During Treatment: Gait belt Activity Tolerance: Patient tolerated treatment well Patient left: in chair;with call bell/phone within reach;with chair alarm set Nurse Communication: Mobility status PT Visit Diagnosis: Muscle weakness (generalized) (M62.81);Unsteadiness on feet (R26.81);History of falling (Z91.81)     Time: 1527-1550 PT Time Calculation (min)  (ACUTE ONLY): 23 min  Charges:  $Gait Training: (P) 23-37 mins                     Quinton Voth, SPT Acute Rehab  [phone]   Kajah Santizo 06/17/2019, 5:23 PM

## 2019-06-17 NOTE — Evaluation (Signed)
Occupational Therapy Evaluation Patient Details Name: Bryce Mendoza MRN: [id number] DOB: 10-Mar-1960 Today's Date: 06/17/2019    History of Present Illness Pt is a 59 y/o male admitted secondary to worsening weakness and numbness in BUE and BLE. Pt reports being completely numb in trunk. Work up pending; possible GB vs MS. PMH includes DVT, PE, and empyema of lung.    Clinical Impression   This 59 yo male admitted with above presents to acute OT with being totally independent with basic and IADLs before his current symptoms started. Currently he is heavily reliant on his Bil UEs for sit<>stand with more difficulty with lower surfaces. He cannot cross his legs to get to his feet with bathing and dressing nor is he able to stand without at least one hand on the RW. He will benefit from acute OT with follow up on CIR to get to a Mod I level or better.     Follow Up Recommendations  CIR;Supervision/Assistance - 24 hour    Equipment Recommendations  Other (comment)(TBD next venue)       Precautions / Restrictions Precautions Precautions: Fall Restrictions Weight Bearing Restrictions: No      Mobility Bed Mobility Overal bed mobility: Needs Assistance Bed Mobility: Supine to Sit;Sit to Supine     Supine to sit: Supervision Sit to supine: Supervision      Transfers Overall transfer level: Needs assistance Equipment used: Rolling walker (2 wheeled) Transfers: Sit to/from Stand Sit to Stand: Min assist;Mod assist         General transfer comment: min A from regular height bed, mod A from standard height toilet    Balance Overall balance assessment: Needs assistance Sitting-balance support: No upper extremity supported;Feet supported Sitting balance-Leahy Scale: Good     Standing balance support: Single extremity supported;During functional activity Standing balance-Leahy Scale: Poor                             ADL either performed or assessed with clinical  judgement   ADL Overall ADL's : Needs assistance/impaired Eating/Feeding: Independent;Sitting   Grooming: Supervision/safety;Set up;Sitting   Upper Body Bathing: Supervision/ safety;Set up;Sitting   Lower Body Bathing: Moderate assistance Lower Body Bathing Details (indicate cue type and reason): min A from normal height bed for sit to stand Upper Body Dressing : Supervision/safety;Set up;Sitting   Lower Body Dressing: Moderate assistance Lower Body Dressing Details (indicate cue type and reason): min A from normal height bed for sit<>stand Toilet Transfer: Minimal assistance;Moderate assistance;Ambulation;RW;Regular Teacher, adult education Details (indicate cue type and reason): min A stand to sit with grab bar, Mod A sit>stand with grab bar (regular height toilet) Toileting- Clothing Manipulation and Hygiene: Minimal assistance Toileting - Clothing Manipulation Details (indicate cue type and reason): Mod A sit<>stand for regular height toilet             Vision Patient Visual Report: No change from baseline              Pertinent Vitals/Pain Pain Assessment: Faces Faces Pain Scale: Hurts little more Pain Location: Bil fingers and toes Pain Descriptors / Indicators: Pins and needles Pain Intervention(s): Limited activity within patient's tolerance;Monitored during session     Hand Dominance Right   Extremity/Trunk Assessment Upper Extremity Assessment Upper Extremity Assessment: RUE deficits/detail;LUE deficits/detail RUE Deficits / Details: 4/5 except grip (2+-3/5) LUE Deficits / Details: 4/5 except grip (2+-3/5)           Communication Communication Communication:  No difficulties   Cognition Arousal/Alertness: Awake/alert Behavior During Therapy: WFL for tasks assessed/performed Overall Cognitive Status: Within Functional Limits for tasks assessed                                                Home Living Family/patient expects to be  discharged to:: Private residence Living Arrangements: Non-relatives/Friends Available Help at Discharge: Friend(s);Available PRN/intermittently Type of Home: House Home Access: Level entry     Home Layout: Two level Alternate Level Stairs-Number of Steps: flight   Bathroom Shower/Tub: Tub/shower unit;Curtain   Biochemist, clinical: Standard     Home Equipment: Cane - single point          Prior Functioning/Environment Level of Independence: Independent                 OT Problem List: Decreased strength;Impaired balance (sitting and/or standing);Decreased coordination;Pain      OT Treatment/Interventions: Self-care/ADL training;DME and/or AE instruction;Patient/family education;Balance training;Therapeutic exercise;Therapeutic activities    OT Goals(Current goals can be found in the care plan section) Acute Rehab OT Goals Patient Stated Goal: to figure out what is going on OT Goal Formulation: With patient Time For Goal Achievement: 07/01/19 Potential to Achieve Goals: Good  OT Frequency: Min 2X/week   Barriers to D/C: Decreased caregiver support             AM-PAC OT "6 Clicks" Daily Activity     Outcome Measure Help from another person eating meals?: None Help from another person taking care of personal grooming?: A Little Help from another person toileting, which includes using toliet, bedpan, or urinal?: A Lot Help from another person bathing (including washing, rinsing, drying)?: A Lot Help from another person to put on and taking off regular upper body clothing?: A Little Help from another person to put on and taking off regular lower body clothing?: A Lot 6 Click Score: 16   End of Session Equipment Utilized During Treatment: Gait belt;Rolling walker  Activity Tolerance: Patient tolerated treatment well Patient left: with bed alarm set;in bed;with call bell/phone within reach  OT Visit Diagnosis: Unsteadiness on feet (R26.81);Other abnormalities of  gait and mobility (R26.89);Muscle weakness (generalized) (M62.81);Pain Pain - Right/Left: (both) Pain - part of body: (fingers and toes)                Time: 4401-0272 OT Time Calculation (min): 22 min Charges:  OT General Charges $OT Visit: 1 Visit OT Evaluation $OT Eval Moderate Complexity: Andrews, OTR/L Acute [employer] Pager [phone] Office [phone]     Almon Register 06/17/2019, 11:42 AM
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
## 2019-06-18 LAB — CSF CULTURE W GRAM STAIN: Culture: NO GROWTH

## 2019-06-18 NOTE — Plan of Care (Signed)
  Problem: Activity: Goal: Risk for activity intolerance will decrease Outcome: Progressing   

## 2019-06-18 NOTE — Progress Notes (Signed)
VC 2.9L NIF >-40 Good pt effort

## 2019-06-18 NOTE — Progress Notes (Signed)
PROGRESS NOTE  Bryce Mendoza  NZV:[id number] DOB: 01-07-1961 DOA: 06/14/2019 PCP: Clent Demark, PA-C   Brief Narrative: Bryce Mendoza is a 59 y.o. male with a history of DVT/PE and empyema in 2019 who presented to [facility] ED 4/13 with several months of sensory changes in feet advancing to include the legs, hands, arms, and most recently the abdomen characterized as numbness and more recently including weakness in an ascending pattern. Due to concern for ascending paralysis, ED > ED transfer was performed for urgent neurology consultation at [facility]. LP performed, MRI showed no explanation for findings. Further blood work has been sent and is pending. Neurology has initiated IVIG x5 days for subacute neuropathy versus polyradiculoneuropathy.   Assessment & Plan: Principal Problem:   Ascending paralysis ([place]) Active Problems:   Hyperglycemia   Bilirubinuria   Dupuytren contracture  Subacute polyneuropathy: Sensory > motor. ESR, CRP not elevated. SSA, SSB normal. Vitamin B12 normal at 457. HIV NR, TSH wnl, CSF protein 49, normal cell count.   - Appreciate neurology recommendations, continuing IVIG x5 days (4/14 - 4/18).  - Autoimmune neuropathy panel labs pending  - Continue serial FVC, NIF which remain reassuring. - PT/OT to evaluate. CIR consulted for admission.  - Supplement folic acid as below.  Folic acid deficiency: Severe. Unclear what, if any association this has with neuropathy. - Supplement daily.   Hyperglycemia: HbA1c 5.5%. - Would start SSI if steroids initiated.  Hypokalemia:  - Supplement and continue monitoring.   Hypomagnesemia:  - Supplement and monitor.  Macrocytic anemia: Mild. B12 wnl.  - Folate previously low-normal  Hepatic steatosis, hyperbilirubinemia:  - Fractionated bilirubin normal on 4/16  DVT prophylaxis: Lovenox Code Status: DNR, confirmed with patient on 4/16. Patient would also wish to have his body donated for research. Family Communication: None  at bedside Disposition Plan:  Status is: Inpatient  Remains inpatient appropriate because:IV treatments appropriate due to intensity of illness or inability to take PO and Inpatient level of care appropriate due to severity of illness   Dispo: The patient is from: Home              Anticipated d/c is to: CIR (consult requested)              Anticipated d/c date is: 2 days              Patient currently is not medically stable to d/c. If able to receive IVIG at CIR, would be able to discharge presently.  Consultants:   Neurology  PM&R  Procedures:   Lumbar puncture 06/15/2019  MRI:  IMPRESSION: 1. No acute findings or explanation for the patient's symptoms. 2. The spinal cord appears normal without evidence of myelopathy, abnormal intradural enhancement or compression. 3. Mild spondylosis as described. At C5-6, there is resulting mild spinal stenosis with moderate right-greater-than-left foraminal narrowing. 4. No other significant spinal stenosis or nerve root encroachment. 5. Facet arthropathy in the cervical and lumbar spine as detailed above.  Antimicrobials:  None   Subjective: No significant change to numbness, weakness. Right (dominant) hand remains weak limiting many things requiring dexterity. No new complaints.  Objective: Vitals:   06/17/19 2125 06/18/19 0303 06/18/19 0521 06/18/19 1057  BP: 119/86 (!) 116/91 (!) 131/116 108/72  Pulse: 79 78 74 72  Resp: _0 Temp: 98.1 F (36.7 C) 98.2 F (36.8 C) 98.1 F (36.7 C) 98.2 F (36.8 C)  TempSrc: Oral Oral Oral Oral  SpO2: 97% 95% 97% 97%  Weight:      Height:        Intake/Output Summary (Last 24 hours) at 06/18/2019 1217 Last data filed at 06/18/2019 1000 Gross per 24 hour  Intake 1532.4 ml  Output 800 ml  Net 732.4 ml   Filed Weights   06/15/19 0915  Weight: 113.4 kg   Gen: 59 y.o. male in no distress Pulm: Nonlabored breathing room air. Clear. CV: Regular rate and rhythm. No murmur,  rub, or gallop. No JVD, no dependent edema. GI: Abdomen soft, non-tender, non-distended, with normoactive bowel sounds.  Ext: Warm, no deformities Skin: No rashes, lesions or ulcers on visualized skin. Neuro: Alert and oriented. stable dense sensory deficits on legs, arms and extending to upper abdomen, ~4/5 strength in hip flexors, EHL bilaterally, symmetric, loss of patellar DTRs. Grip 4/5 with slowed response.  Psych: Judgement and insight appear fair. Mood euthymic & affect congruent. Behavior is appropriate.    Data Reviewed: I have personally reviewed following labs and imaging studies  CBC: Recent Labs  Lab 06/14/19 1752 06/15/19 0840 06/16/19 0711  WBC 8.9 8.5 8.1  NEUTROABS 5.3 4.8 6.9  HGB 15.1 13.6 12.2*  HCT 44.1 39.4 35.7*  MCV 110.5* 111.3* 110.5*  PLT 221 207 378   Basic Metabolic Panel: Recent Labs  Lab 06/14/19 1752 06/15/19 0840 06/16/19 0711 06/17/19 0354  NA 138 136 132* 135  K 3.6 4.0 3.6 3.2*  CL 103 104 102 102  CO2 22 21* 20* 24  GLUCOSE 114* 99 103* 83  BUN _0 CREATININE 0.89 0.87 0.88 0.79  CALCIUM 8.9 8.6* 8.5* 8.7*  MG  --   --  1.5*  --   PHOS  --  4.3  --   --    GFR: Estimated Creatinine Clearance: 134 mL/min (by C-G formula based on SCr of 0.79 mg/dL). Liver Function Tests: Recent Labs  Lab 06/14/19 1752 06/15/19 0256 06/15/19 0840 06/16/19 0711 06/17/19 0354  AST 35  --   --  23  --   ALT 23  --   --  15  --   ALKPHOS 83  --   --  66  --   BILITOT 2.3*  --   --  1.8* 1.0  PROT 7.4  --   --  6.7  --   ALBUMIN 3.7 3.6* 3.2* 2.9*  --    No results for input(s): LIPASE, AMYLASE in the last 168 hours. No results for input(s): AMMONIA in the last 168 hours. Coagulation Profile: Recent Labs  Lab 06/15/19 0840  INR 1.2   Cardiac Enzymes: Recent Labs  Lab 06/15/19 0840  CKTOTAL 58   BNP (last 3 results) No results for input(s): PROBNP in the last 8760 hours. HbA1C: No results for input(s): HGBA1C in the last  72 hours. CBG: No results for input(s): GLUCAP in the last 168 hours. Lipid Profile: No results for input(s): CHOL, HDL, LDLCALC, TRIG, CHOLHDL, LDLDIRECT in the last 72 hours. Thyroid Function Tests: No results for input(s): TSH, T4TOTAL, FREET4, T3FREE, THYROIDAB in the last 72 hours. Anemia Panel: Recent Labs    06/15/19 1559 06/17/19 0354  VITAMINB12 457  --   FOLATE  --  2.9*   Urine analysis:    Component Value Date/Time   COLORURINE RED (A) 06/14/2019 1929   APPEARANCEUR TURBID (A) 06/14/2019 1929   LABSPEC 1.027 06/14/2019 1929   PHURINE 5.0 06/14/2019 [place] NEGATIVE 06/14/2019 [place] NEGATIVE 06/14/2019 1929  BILIRUBINUR SMALL (A) 06/14/2019 [place] NEGATIVE 06/14/2019 1929   PROTEINUR 30 (A) 06/14/2019 1929   NITRITE NEGATIVE 06/14/2019 1929   LEUKOCYTESUR NEGATIVE 06/14/2019 1929   Recent Results (from the past 240 hour(s))  Culture, blood (routine x 2)     Status: None (Preliminary result)   Collection Time: 06/15/19  1:15 AM   Specimen: BLOOD  Result Value Ref Range Status   Specimen Description BLOOD RIGHT HAND  Final   Special Requests   Final    BOTTLES DRAWN AEROBIC AND ANAEROBIC Blood Culture results may not be optimal due to an inadequate volume of blood received in culture bottles   Culture   Final    NO GROWTH 3 DAYS Performed at [facility], [place] [address] [place], [place] [address]    Report Status PENDING  Incomplete  Culture, blood (routine x 2)     Status: None (Preliminary result)   Collection Time: 06/15/19  1:47 AM   Specimen: BLOOD  Result Value Ref Range Status   Specimen Description BLOOD LEFT ARM  Final   Special Requests   Final    BOTTLES DRAWN AEROBIC AND ANAEROBIC Blood Culture adequate volume   Culture   Final    NO GROWTH 3 DAYS Performed at [facility], [place] [address] [place], [place] [address]    Report Status PENDING  Incomplete  CSF culture     Status: None   Collection Time:  06/15/19  2:53 AM   Specimen: CSF; Cerebrospinal Fluid  Result Value Ref Range Status   Specimen Description CSF  Final   Special Requests NONE  Final   Gram Stain CYTOSPIN SMEAR NO WBC SEEN NO ORGANISMS SEEN   Final   Culture   Final    NO GROWTH 3 DAYS Performed at [facility], [place] [address] [place], [place] [address]    Report Status 06/18/2019 FINAL  Final  SARS CORONAVIRUS 2 (TAT 6-24 HRS) Nasopharyngeal Nasopharyngeal Swab     Status: None   Collection Time: 06/15/19  5:24 AM   Specimen: Nasopharyngeal Swab  Result Value Ref Range Status   SARS Coronavirus 2 NEGATIVE NEGATIVE Final    Comment: (NOTE) SARS-CoV-2 target nucleic acids are NOT DETECTED. The SARS-CoV-2 RNA is generally detectable in upper and lower respiratory specimens during the acute phase of infection. Negative results do not preclude SARS-CoV-2 infection, do not rule out co-infections with other pathogens, and should not be used as the sole basis for treatment or other patient management decisions. Negative results must be combined with clinical observations, patient history, and epidemiological information. The expected result is Negative. Fact Sheet for Patients: SugarRoll.be Fact Sheet for Healthcare Providers: https://www.woods-mathews.com/ This test is not yet approved or cleared by the [place] FDA and  has been authorized for detection and/or diagnosis of SARS-CoV-2 by FDA under an Emergency Use Authorization (EUA). This EUA will remain  in effect (meaning this test can be used) for the duration of the COVID-19 declaration under Section 56 4(b)(1) of the Act, 21 U.S.C. section 360bbb-3(b)(1), unless the authorization is terminated or revoked sooner. Performed at [facility], [place] [address], [place],  [address]   Culture, Urine     Status: None   Collection Time: 06/15/19  6:08 AM   Specimen: Urine, Random  Result Value Ref  Range Status   Specimen Description URINE, RANDOM  Final   Special Requests NONE  Final   Culture  Final    NO GROWTH Performed at [facility], [place] [address] [place], [place] [address]    Report Status 06/16/2019 FINAL  Final      Radiology Studies: No results found.  Scheduled Meds: . enoxaparin (LOVENOX) injection  40 mg Subcutaneous Q24H  . feeding supplement (ENSURE ENLIVE)  237 mL Oral BID BM  . folic acid  1 mg Oral Daily  . multivitamin with minerals  1 tablet Oral QHS   Continuous Infusions: . Immune Globulin 10% Stopped (06/17/19 2145)     LOS: 3 days   Time spent: 25 minutes.  Patrecia Pour, MD [facility] [email or web address] 06/18/2019, 12:17 PM

## 2019-06-18 NOTE — Progress Notes (Signed)
Subjective: No changes  Exam: Vitals:   06/18/19 0521 06/18/19 1057  BP: (!) 131/116 108/72  Pulse: 74 72  Resp: 17 18  Temp: 98.1 F (36.7 C) 98.2 F (36.8 C)  SpO2: 97% 97%   Gen: In bed, NAD Resp: non-labored breathing, no acute distress Abd: soft, nt  Neuro: MS: awake, alert,  BP:ZWCHE, EOMI Motor: 4/5 hip flexors, 4+/5 distally. Mild APB weakness Sensory: Diminished to mid thigh bilaterally to temperature. DTR: Absent in the legs, preserved in the biceps  Pertinent Labs: ESR, CRP-normal SSA, SSB-normal B12-457 CSF protein-49, normal cell count HIV-negative TSH-no [place] 116 SPEP negative B1, vitamin E, autoimmune neuropathy panel-pending  Impression: 59 year old male with subacute predominantly sensory (though with some motor involvement) neuropathy.  Differential includes metabolic/nutritional versus autoimmune causes.  He has been started on IVIG in case of a possible autoimmune cause given the progressive nature, however folate has come back at a low level which could be contributing.  One  Interesting finding with folate deficiency neuropathy is relatively frequent preservation of biceps reflex[1], as noted in our patient.  At this point, given that we are already mostly done with the IVIG, I would favor finishing this out.  Recommendations: 1) continue IVIG 2) PT, OT  Roland Rack, MD [facility] [phone]  If 7pm- 7am, please page neurology on call as listed in [place].  [1] [email or web address]/[email or web address]

## 2019-06-18 NOTE — Plan of Care (Signed)
  Problem: Clinical Measurements: Goal: Diagnostic test results will improve Outcome: Progressing   

## 2019-06-18 NOTE — Progress Notes (Signed)
VC 2.7l, NIF >-40.  Patient with good effort.

## 2019-06-19 MED ORDER — POLYETHYLENE GLYCOL 3350 17 G PO PACK
17.0000 g | PACK | Freq: Every day | ORAL | Status: DC
  Administered 2019-06-19 – 2019-06-22 (×4): 17 g via ORAL
  Filled 2019-06-19 (×4): qty 1

## 2019-06-19 MED ORDER — FOLIC ACID 1 MG PO TABS
2.0000 mg | ORAL_TABLET | Freq: Every day | ORAL | Status: DC
  Administered 2019-06-19 – 2019-06-22 (×4): 2 mg via ORAL
  Filled 2019-06-19 (×4): qty 2

## 2019-06-19 MED ORDER — GABAPENTIN 300 MG PO CAPS
300.0000 mg | ORAL_CAPSULE | Freq: Three times a day (TID) | ORAL | Status: DC
  Administered 2019-06-19 – 2019-06-22 (×10): 300 mg via ORAL
  Filled 2019-06-19 (×10): qty 1

## 2019-06-19 MED ORDER — SENNOSIDES-DOCUSATE SODIUM 8.6-50 MG PO TABS
2.0000 | ORAL_TABLET | Freq: Two times a day (BID) | ORAL | Status: DC
  Administered 2019-06-19 – 2019-06-22 (×7): 2 via ORAL
  Filled 2019-06-19 (×7): qty 2

## 2019-06-19 MED ORDER — SENNOSIDES-DOCUSATE SODIUM 8.6-50 MG PO TABS: 1.0000 | ORAL_TABLET | Freq: Two times a day (BID) | ORAL | Status: DC

## 2019-06-19 NOTE — Progress Notes (Signed)
Subjective: No changes  Exam: Vitals:   06/18/19 2147 06/19/19 0618  BP: 114/85 113/77  Pulse: 72 67  Resp: 18 18  Temp: 98.6 F (37 C) 98 F (36.7 C)  SpO2: 98% 94%   Gen: In bed, NAD Resp: non-labored breathing, no acute distress Abd: soft, nt  Neuro: MS: awake, alert,  OK:HTXHF, EOMI Motor: 4+/5 hip flexors, 4+/5 distally. Mild APB weakness Sensory: Diminished to mid thigh bilaterally to temperature. DTR: Absent in the legs, preserved in the biceps  Pertinent Labs: ESR, CRP-normal SSA, SSB-normal B12-457 CSF protein-49, normal cell count HIV-negative TSH-no [place] 116 SPEP negative B1, vitamin E, b6, autoimmune neuropathy panel-pending  Impression: 59 year old male with subacute predominantly sensory (though with some motor involvement) neuropathy.  Differential includes metabolic/nutritional versus autoimmune causes.  He has been started on IVIG in case of a possible autoimmune cause given the progressive nature, however folate has come back at a low level which could be contributing. He has had no response to IVIG. One  Interesting finding with folate deficiency neuropathy is relatively frequent preservation of biceps reflex[1], as noted in our patient.  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   Recommendations: 1) continue folate supplementation.  2) EMG as outpatient  3) PT, OT 3) Neurology will be available as needed moving forward.   Roland Rack, MD [facility] [phone]  If 7pm- 7am, please page neurology on call as listed in [place].  [1] [email or web address]

## 2019-06-19 NOTE — Plan of Care (Signed)
  Problem: Education: Goal: Knowledge of General Education information will improve Description Including pain rating scale, medication(s)/side effects and non-pharmacologic comfort measures Outcome: Progressing   

## 2019-06-19 NOTE — Progress Notes (Signed)
PROGRESS NOTE  Bryce Mendoza  YOV:[id number] DOB: September 26, 1960 DOA: 06/14/2019 PCP: Clent Demark, PA-C   Brief Narrative: Bryce Mendoza is a 59 y.o. male with a history of DVT/PE and empyema in 2019 who presented to [facility] ED 4/13 with several months of sensory changes in feet advancing to include the legs, hands, arms, and most recently the abdomen characterized as numbness and more recently including weakness in an ascending pattern. Due to concern for ascending paralysis, ED > ED transfer was performed for urgent neurology consultation at [facility]. LP performed, MRI showed no explanation for findings. Further blood work has been sent and is pending. Neurology has initiated IVIG x5 days for subacute neuropathy versus polyradiculoneuropathy.   Assessment & Plan: Principal Problem:   Ascending paralysis ([place]) Active Problems:   Hyperglycemia   Bilirubinuria   Dupuytren contracture  Subacute polyneuropathy: Sensory > motor. ESR, CRP not elevated. SSA, SSB normal. Vitamin B12 normal at 457. HIV NR, TSH wnl, CSF protein 49, normal cell count.   - Appreciate neurology recommendations, continuing IVIG x5 days (4/14 - 4/18).  - Autoimmune neuropathy and vitamin panel labs remain pending  - Continue serial FVC, NIF which remain reassuring. - PT/OT to evaluate. CIR consulted for admission.  - Supplement folic acid as below. - Add neurontin for neuropathic pain.  Folic acid deficiency: Severe. Unclear what, if any association this has with neuropathy. - Supplement daily.   Hyperglycemia: HbA1c 5.5%. - Would start SSI if steroids initiated.  Hypokalemia:  - Supplement and continue monitoring.   Hypomagnesemia:  - Supplement and monitor.  Macrocytic anemia: Due to folic acid deficiency, supplementing as above.  Hepatic steatosis, hyperbilirubinemia:  - Fractionated bilirubin normal on 4/16  Constipation:  - Start senna, miralax scheduled.  DVT prophylaxis: Lovenox Code Status: DNR,  confirmed with patient on 4/16. Patient would also wish to have his body donated for research. Family Communication: None at bedside Disposition Plan:  Status is: Inpatient  Remains inpatient appropriate because:IV treatments appropriate due to intensity of illness or inability to take PO and Inpatient level of care appropriate due to severity of illness   Dispo: The patient is from: Home              Anticipated d/c is to: CIR (consult requested)              Anticipated d/c date is: 2 days              Patient currently is not medically stable to d/c. If able to receive IVIG at CIR, would be able to discharge presently.  Consultants:   Neurology  PM&R  Procedures:   Lumbar puncture 06/15/2019  MRI:  IMPRESSION: 1. No acute findings or explanation for the patient's symptoms. 2. The spinal cord appears normal without evidence of myelopathy, abnormal intradural enhancement or compression. 3. Mild spondylosis as described. At C5-6, there is resulting mild spinal stenosis with moderate right-greater-than-left foraminal narrowing. 4. No other significant spinal stenosis or nerve root encroachment. 5. Facet arthropathy in the cervical and lumbar spine as detailed above.  Antimicrobials:  None   Subjective: Numbness starting to affect lats, no regression of symptoms yet. Starting to need pain medication more frequently for burning/freezing type pain in hands and feet. No BM for days.  Objective: Vitals:   06/19/19 1828 06/19/19 1846 06/19/19 1919 06/19/19 1941  BP: 110/77 113/86 118/89 108/76  Pulse: 85 78 82 79  Resp: _0 Temp: 98.8 F (37.1 C)  98.3 F (36.8 C) 98.2 F (36.8 C) 99.2 F (37.3 C)  TempSrc: Oral Oral Oral Oral  SpO2: 95% 96% 96% 95%  Weight:      Height:        Intake/Output Summary (Last 24 hours) at 06/19/2019 2026 Last data filed at 06/19/2019 1700 Gross per 24 hour  Intake 2092.5 ml  Output 1900 ml  Net 192.5 ml   Filed Weights    06/15/19 0915 06/18/19 2147  Weight: 113.4 kg 124.8 kg   Gen: 59 y.o. male in no distress Pulm: Nonlabored breathing room air. Clear. CV: Regular rate and rhythm. No murmur, rub, or gallop. No JVD, no dependent edema. GI: Abdomen soft, non-tender, non-distended, with normoactive bowel sounds.  Ext: Warm, no deformities Skin: No rashes, lesions or ulcers on visualized skin. Neuro: Alert and oriented. Stable numbness and weakness in stocking-glove distribution extending to abdomen and now to lats bilaterally focal neurological deficits. Psych: Judgement and insight appear fair. Mood euthymic & affect congruent. Behavior is appropriate.    Data Reviewed: I have personally reviewed following labs and imaging studies  CBC: Recent Labs  Lab 06/14/19 1752 06/15/19 0840 06/16/19 0711  WBC 8.9 8.5 8.1  NEUTROABS 5.3 4.8 6.9  HGB 15.1 13.6 12.2*  HCT 44.1 39.4 35.7*  MCV 110.5* 111.3* 110.5*  PLT 221 207 939   Basic Metabolic Panel: Recent Labs  Lab 06/14/19 1752 06/15/19 0840 06/16/19 0711 06/17/19 0354  NA 138 136 132* 135  K 3.6 4.0 3.6 3.2*  CL 103 104 102 102  CO2 22 21* 20* 24  GLUCOSE 114* 99 103* 83  BUN _0 CREATININE 0.89 0.87 0.88 0.79  CALCIUM 8.9 8.6* 8.5* 8.7*  MG  --   --  1.5*  --   PHOS  --  4.3  --   --    GFR: Estimated Creatinine Clearance: 140.7 mL/min (by C-G formula based on SCr of 0.79 mg/dL). Liver Function Tests: Recent Labs  Lab 06/14/19 1752 06/15/19 0256 06/15/19 0840 06/16/19 0711 06/17/19 0354  AST 35  --   --  23  --   ALT 23  --   --  15  --   ALKPHOS 83  --   --  66  --   BILITOT 2.3*  --   --  1.8* 1.0  PROT 7.4  --   --  6.7  --   ALBUMIN 3.7 3.6* 3.2* 2.9*  --     Anemia Panel: Recent Labs    06/17/19 0354  FOLATE 2.9*   Urine analysis:    Component Value Date/Time   COLORURINE RED (A) 06/14/2019 1929   APPEARANCEUR TURBID (A) 06/14/2019 1929   LABSPEC 1.027 06/14/2019 1929   PHURINE 5.0 06/14/2019 1929    GLUCOSEU NEGATIVE 06/14/2019 1929   HGBUR NEGATIVE 06/14/2019 1929   BILIRUBINUR SMALL (A) 06/14/2019 [place] NEGATIVE 06/14/2019 1929   PROTEINUR 30 (A) 06/14/2019 1929   NITRITE NEGATIVE 06/14/2019 1929   LEUKOCYTESUR NEGATIVE 06/14/2019 1929   Recent Results (from the past 240 hour(s))  Culture, blood (routine x 2)     Status: None (Preliminary result)   Collection Time: 06/15/19  1:15 AM   Specimen: BLOOD  Result Value Ref Range Status   Specimen Description BLOOD RIGHT HAND  Final   Special Requests   Final    BOTTLES DRAWN AEROBIC AND ANAEROBIC Blood Culture results may not be optimal due to an inadequate volume of  blood received in culture bottles   Culture   Final    NO GROWTH 4 DAYS Performed at [facility], [place] [address], [place], [place] [address]    Report Status PENDING  Incomplete  Culture, blood (routine x 2)     Status: None (Preliminary result)   Collection Time: 06/15/19  1:47 AM   Specimen: BLOOD  Result Value Ref Range Status   Specimen Description BLOOD LEFT ARM  Final   Special Requests   Final    BOTTLES DRAWN AEROBIC AND ANAEROBIC Blood Culture adequate volume   Culture   Final    NO GROWTH 4 DAYS Performed at [facility], [place] [address], [place], [place] [address]    Report Status PENDING  Incomplete  CSF culture     Status: None   Collection Time: 06/15/19  2:53 AM   Specimen: CSF; Cerebrospinal Fluid  Result Value Ref Range Status   Specimen Description CSF  Final   Special Requests NONE  Final   Gram Stain CYTOSPIN SMEAR NO WBC SEEN NO ORGANISMS SEEN   Final   Culture   Final    NO GROWTH 3 DAYS Performed at [facility], [place] [address], [place], [place] [address]    Report Status 06/18/2019 FINAL  Final  SARS CORONAVIRUS 2 (TAT 6-24 HRS) Nasopharyngeal Nasopharyngeal Swab     Status: None   Collection Time: 06/15/19  5:24 AM   Specimen: Nasopharyngeal Swab  Result Value Ref Range Status   SARS Coronavirus 2  NEGATIVE NEGATIVE Final    Comment: (NOTE) SARS-CoV-2 target nucleic acids are NOT DETECTED. The SARS-CoV-2 RNA is generally detectable in upper and lower respiratory specimens during the acute phase of infection. Negative results do not preclude SARS-CoV-2 infection, do not rule out co-infections with other pathogens, and should not be used as the sole basis for treatment or other patient management decisions. Negative results must be combined with clinical observations, patient history, and epidemiological information. The expected result is Negative. Fact Sheet for Patients: SugarRoll.be Fact Sheet for Healthcare Providers: https://www.woods-mathews.com/ This test is not yet approved or cleared by the [place] FDA and  has been authorized for detection and/or diagnosis of SARS-CoV-2 by FDA under an Emergency Use Authorization (EUA). This EUA will remain  in effect (meaning this test can be used) for the duration of the COVID-19 declaration under Section 56 4(b)(1) of the Act, 21 U.S.C. section 360bbb-3(b)(1), unless the authorization is terminated or revoked sooner. Performed at [facility], [place] [address] [place], [place] [address]   Culture, Urine     Status: None   Collection Time: 06/15/19  6:08 AM   Specimen: Urine, Random  Result Value Ref Range Status   Specimen Description URINE, RANDOM  Final   Special Requests NONE  Final   Culture   Final    NO GROWTH Performed at [facility], [place] [address], [place],  [address]    Report Status 06/16/2019 FINAL  Final      Radiology Studies: No results found.  Scheduled Meds: . enoxaparin (LOVENOX) injection  40 mg Subcutaneous Q24H  . feeding supplement (ENSURE ENLIVE)  237 mL Oral BID BM  . folic acid  2 mg Oral Daily  . gabapentin  300 mg Oral TID  . multivitamin with minerals  1 tablet Oral QHS  . polyethylene glycol  17 g Oral Daily  . senna-docusate   2 tablet Oral BID  Continuous Infusions:    LOS: 4 days   Time spent: 25 minutes.  Patrecia Pour, MD [facility] [email or web address] 06/19/2019, 8:26 PM

## 2019-06-20 DIAGNOSIS — G610 Guillain-Barre syndrome: Principal | ICD-10-CM

## 2019-06-20 LAB — CULTURE, BLOOD (ROUTINE X 2)
Culture: NO GROWTH
Culture: NO GROWTH
Special Requests: ADEQUATE

## 2019-06-20 LAB — GLUCOSE, CAPILLARY
Glucose-Capillary: 89 mg/dL (ref 70–99)
Glucose-Capillary: 121 mg/dL — ABNORMAL HIGH (ref 70–99)

## 2019-06-20 LAB — OLIGOCLONAL BANDS, CSF + SERM

## 2019-06-20 MED ORDER — SALINE SPRAY 0.65 % NA SOLN
1.0000 | NASAL | Status: DC | PRN
  Filled 2019-06-20: qty 44

## 2019-06-20 NOTE — Progress Notes (Addendum)
Inpatient Rehabilitation Admissions Coordinator  Met with patient at bedside for rehab assessment and review of goals and expectations of an inpt rehab admit. Upon entry into room, patient ambulating alone to bathroom holding onto furniture. I advised that he needs supervision when up even to bathroom. Patient states he has [employer]. I will clarify and seek insurance approval for a possible CIR admit.  Danne Baxter, RN, MSN Rehab Admissions Coordinator [phone] 06/20/2019 1:50 PM   COBRA is currently inactive. I have asked patient to contact his boss at [employer] to clarify is he currently has Medical insurance coverage so that I can pursue approval for CIR. We also discussed cost of care for CIR admit without insurance. I will follow up tomorrow.  Danne Baxter, RN, MSN Rehab Admissions Coordinator [phone] 06/20/2019 4:13 PM   Insurance is [employer] [id number]. We will call to verify.

## 2019-06-20 NOTE — Progress Notes (Signed)
PROGRESS NOTE  Bryce Mendoza  EYC:[id number] DOB: September 15, 1960 DOA: 06/14/2019 PCP: Clent Demark, PA-C   Brief Narrative: Bryce Mendoza is a 59 y.o. male with a history of DVT/PE and empyema in 2019 who presented to [facility] ED 4/13 with several months of sensory changes in feet advancing to include the legs, hands, arms, and most recently the abdomen characterized as numbness and more recently including weakness in an ascending pattern. Due to concern for ascending paralysis, ED > ED transfer was performed for urgent neurology consultation at [facility]. LP performed, MRI showed no explanation for findings. Further blood work has been sent and is pending. Neurology has initiated IVIG x5 days for subacute neuropathy versus polyradiculoneuropathy.   Assessment & Plan: Principal Problem:   Ascending paralysis ([place]) Active Problems:   Hyperglycemia   Bilirubinuria   Dupuytren contracture  Subacute polyneuropathy: Sensory > motor. ESR, CRP not elevated. SSA, SSB normal. Vitamin B12 normal at 457. HIV NR, TSH wnl, CSF protein 49, normal cell count.   - Appreciate neurology recommendations, completed IVIG x5 days (4/14 - 4/18) without appreciable improvement. - Autoimmune neuropathy and vitamin panel labs remain pending. Electrophoresis wnl. - Continue serial FVC, NIF which remain reassuring. - PT/OT to evaluate. CIR consulted for admission. Pending insurance (whether pt has this or not) - Supplement folic acid as below. - Added neurontin for neuropathic pain.  Folic acid deficiency: Severe. See neurology notes, this is a rarely cited cause of neuropathies consistent with this patient's findings. - Supplement daily.   Hyperglycemia: HbA1c 5.5%.  Hypokalemia:  - Supplement and continue monitoring.   Hypomagnesemia:  - Supplement and monitor.  Macrocytic anemia: Due to folic acid deficiency, supplementing as above.  Hepatic steatosis, hyperbilirubinemia:  - Fractionated bilirubin normal on  4/16  Constipation:  - Started senna, miralax scheduled. Can give dulcolax supp if needed.  DVT prophylaxis: Lovenox Code Status: DNR, confirmed with patient on 4/16. Patient would also wish to have his body donated for research. Family Communication: None at bedside Disposition Plan:  Status is: Inpatient  Remains inpatient appropriate because:IV treatments appropriate due to intensity of illness or inability to take PO and Inpatient level of care appropriate due to severity of illness   Dispo: The patient is from: Home              Anticipated d/c is to: CIR (pending insurance)              Anticipated d/c date is: 2 days              Patient currently is medically stable to d/c.  Consultants:   Neurology  PM&R  Procedures:   Lumbar puncture 06/15/2019  MRI:  IMPRESSION: 1. No acute findings or explanation for the patient's symptoms. 2. The spinal cord appears normal without evidence of myelopathy, abnormal intradural enhancement or compression. 3. Mild spondylosis as described. At C5-6, there is resulting mild spinal stenosis with moderate right-greater-than-left foraminal narrowing. 4. No other significant spinal stenosis or nerve root encroachment. 5. Facet arthropathy in the cervical and lumbar spine as detailed above.  Antimicrobials:  None   Subjective: No significant change in numbness, weakness. Getting up to bathroom some with significant effort.  Objective: Vitals:   06/19/19 2131 06/20/19 0609 06/20/19 0908 06/20/19 1616  BP: 120/82 120/80 113/84 130/81  Pulse: 81 79 79 78  Resp: '18 16 18 18  ' Temp: 98.3 F (36.8 C) 98.2 F (36.8 C) 98 F (36.7 C) 97.8 F (36.6 C)  TempSrc: Oral Oral Oral Oral  SpO2: 97% 97% 96% 95%  Weight: 125.6 kg     Height:        Intake/Output Summary (Last 24 hours) at 06/20/2019 1751 Last data filed at 06/20/2019 1528 Gross per 24 hour  Intake 1280 ml  Output 600 ml  Net 680 ml   Filed Weights   06/15/19 0915  06/18/19 2147 06/19/19 2131  Weight: 113.4 kg 124.8 kg 125.6 kg   Gen: 59 y.o. male in no distress Pulm: Nonlabored breathing room air. Clear. CV: Regular rate and rhythm. No murmur, rub, or gallop. No JVD, no dependent edema. GI: Abdomen soft, non-tender, non-distended, with normoactive bowel sounds.  Ext: Warm, no deformities Skin: No rashes, lesions or ulcers on visualized skin. Neuro: Alert and oriented. Unchanged deficits compared to prior exams. Psych: Judgement and insight appear fair. Mood euthymic & affect congruent. Behavior is appropriate.    Data Reviewed: I have personally reviewed following labs and imaging studies  CBC: Recent Labs  Lab 06/14/19 1752 06/15/19 0840 06/16/19 0711  WBC 8.9 8.5 8.1  NEUTROABS 5.3 4.8 6.9  HGB 15.1 13.6 12.2*  HCT 44.1 39.4 35.7*  MCV 110.5* 111.3* 110.5*  PLT 221 207 149   Basic Metabolic Panel: Recent Labs  Lab 06/14/19 1752 06/15/19 0840 06/16/19 0711 06/17/19 0354  NA 138 136 132* 135  K 3.6 4.0 3.6 3.2*  CL 103 104 102 102  CO2 22 21* 20* 24  GLUCOSE 114* 99 103* 83  BUN '12 11 13 10  ' CREATININE 0.89 0.87 0.88 0.79  CALCIUM 8.9 8.6* 8.5* 8.7*  MG  --   --  1.5*  --   PHOS  --  4.3  --   --    GFR: Estimated Creatinine Clearance: 141.2 mL/min (by C-G formula based on SCr of 0.79 mg/dL). Liver Function Tests: Recent Labs  Lab 06/14/19 1752 06/15/19 0256 06/15/19 0840 06/16/19 0711 06/17/19 0354  AST 35  --   --  23  --   ALT 23  --   --  15  --   ALKPHOS 83  --   --  66  --   BILITOT 2.3*  --   --  1.8* 1.0  PROT 7.4  --   --  6.7  --   ALBUMIN 3.7 3.6* 3.2* 2.9*  --     Anemia Panel: No results for input(s): VITAMINB12, FOLATE, FERRITIN, TIBC, IRON, RETICCTPCT in the last 72 hours. Urine analysis:    Component Value Date/Time   COLORURINE RED (A) 06/14/2019 1929   APPEARANCEUR TURBID (A) 06/14/2019 1929   LABSPEC 1.027 06/14/2019 1929   PHURINE 5.0 06/14/2019 1929   GLUCOSEU NEGATIVE 06/14/2019 [place] NEGATIVE 06/14/2019 1929   BILIRUBINUR SMALL (A) 06/14/2019 [place] NEGATIVE 06/14/2019 1929   PROTEINUR 30 (A) 06/14/2019 1929   NITRITE NEGATIVE 06/14/2019 1929   LEUKOCYTESUR NEGATIVE 06/14/2019 1929   Recent Results (from the past 240 hour(s))  Culture, blood (routine x 2)     Status: None   Collection Time: 06/15/19  1:15 AM   Specimen: BLOOD  Result Value Ref Range Status   Specimen Description BLOOD RIGHT HAND  Final   Special Requests   Final    BOTTLES DRAWN AEROBIC AND ANAEROBIC Blood Culture results may not be optimal due to an inadequate volume of blood received in culture bottles   Culture   Final    NO GROWTH 5 DAYS Performed  at [facility], [place] [address] [place], [place] [address]    Report Status 06/20/2019 FINAL  Final  Culture, blood (routine x 2)     Status: None   Collection Time: 06/15/19  1:47 AM   Specimen: BLOOD  Result Value Ref Range Status   Specimen Description BLOOD LEFT ARM  Final   Special Requests   Final    BOTTLES DRAWN AEROBIC AND ANAEROBIC Blood Culture adequate volume   Culture   Final    NO GROWTH 5 DAYS Performed at [facility], [place] [address] [place], [place] [address]    Report Status 06/20/2019 FINAL  Final  CSF culture     Status: None   Collection Time: 06/15/19  2:53 AM   Specimen: CSF; Cerebrospinal Fluid  Result Value Ref Range Status   Specimen Description CSF  Final   Special Requests NONE  Final   Gram Stain CYTOSPIN SMEAR NO WBC SEEN NO ORGANISMS SEEN   Final   Culture   Final    NO GROWTH 3 DAYS Performed at [facility], [place] [address] [place], [place] [address]    Report Status 06/18/2019 FINAL  Final  SARS CORONAVIRUS 2 (TAT 6-24 HRS) Nasopharyngeal Nasopharyngeal Swab     Status: None   Collection Time: 06/15/19  5:24 AM   Specimen: Nasopharyngeal Swab  Result Value Ref Range Status   SARS Coronavirus 2 NEGATIVE NEGATIVE Final    Comment: (NOTE) SARS-CoV-2 target  nucleic acids are NOT DETECTED. The SARS-CoV-2 RNA is generally detectable in upper and lower respiratory specimens during the acute phase of infection. Negative results do not preclude SARS-CoV-2 infection, do not rule out co-infections with other pathogens, and should not be used as the sole basis for treatment or other patient management decisions. Negative results must be combined with clinical observations, patient history, and epidemiological information. The expected result is Negative. Fact Sheet for Patients: SugarRoll.be Fact Sheet for Healthcare Providers: https://www.woods-mathews.com/ This test is not yet approved or cleared by the [place] FDA and  has been authorized for detection and/or diagnosis of SARS-CoV-2 by FDA under an Emergency Use Authorization (EUA). This EUA will remain  in effect (meaning this test can be used) for the duration of the COVID-19 declaration under Section 56 4(b)(1) of the Act, 21 U.S.C. section 360bbb-3(b)(1), unless the authorization is terminated or revoked sooner. Performed at [facility], [place] [address] [place], [place] [address]   Culture, Urine     Status: None   Collection Time: 06/15/19  6:08 AM   Specimen: Urine, Random  Result Value Ref Range Status   Specimen Description URINE, RANDOM  Final   Special Requests NONE  Final   Culture   Final    NO GROWTH Performed at [facility], [place] [address] [place], [place] [address]    Report Status 06/16/2019 FINAL  Final      Radiology Studies: No results found.  Scheduled Meds: . enoxaparin (LOVENOX) injection  40 mg Subcutaneous Q24H  . feeding supplement (ENSURE ENLIVE)  237 mL Oral BID BM  . folic acid  2 mg Oral Daily  . gabapentin  300 mg Oral TID  . multivitamin with minerals  1 tablet Oral QHS  . polyethylene glycol  17 g Oral Daily  . senna-docusate  2 tablet Oral BID   Continuous Infusions:    LOS: 5 days    Time spent: 25 minutes.  Meredith Leeds  Bonner Puna, MD [facility] [email or web address] 06/20/2019, 5:51 PM
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
## 2019-06-20 NOTE — Progress Notes (Signed)
Physical Therapy Treatment Patient Details Name: Bryce Mendoza MRN: [id number] DOB: October 24, 1960 Today's Date: 06/20/2019    History of Present Illness Pt is a 59 y/o male admitted secondary to worsening weakness and numbness in BUE and BLE. Pt reports being completely numb in trunk. Work up pending; possible GB vs MS. PMH includes DVT, PE, and empyema of lung.     PT Comments    Continuing work on functional mobility and activity tolerance;  Overall it seems today's walk was very similar to last PT session, with similar pattern and heavy dependence on UE support from RW; Still, he is showing more confidence in his ability to move  Follow Up Recommendations  CIR;Supervision for mobility/OOB     Equipment Recommendations  Rolling walker with 5" wheels;3in1 (PT)    Recommendations for Other Services       Precautions / Restrictions Precautions Precautions: Fall    Mobility  Bed Mobility Overal bed mobility: Needs Assistance Bed Mobility: Supine to Sit     Supine to sit: Supervision Sit to supine: Supervision   General bed mobility comments: supervision for safety, no physcial assist. Heavy use of bedrail  Transfers Overall transfer level: Needs assistance Equipment used: Rolling walker (2 wheeled) Transfers: Sit to/from Stand Sit to Stand: Min guard         General transfer comment: Minguard for safety; tends to pull up on RW -- will need more reinforcements of hand placement and safety  Ambulation/Gait Ambulation/Gait assistance: Min guard Gait Distance (Feet): 100 Feet(less need for standing rest breaks) Assistive device: Rolling walker (2 wheeled) Gait Pattern/deviations: Step-through pattern;Decreased stride length;Trunk flexed Gait velocity: markedly dec   General Gait Details: Continued heavy reliance on UEs for stability during gait; cues for RW proximity and upright posture, as well as to relax shoulders; stiff steps, decr knee flexion during swing phase;     Stairs             Wheelchair Mobility    Modified Rankin (Stroke Patients Only)       Balance     Sitting balance-Leahy Scale: Good       Standing balance-Leahy Scale: Poor Standing balance comment: Heavy reliance on UE support                            Cognition Arousal/Alertness: Awake/alert Behavior During Therapy: WFL for tasks assessed/performed Overall Cognitive Status: Within Functional Limits for tasks assessed                                        Exercises      General Comments        Pertinent Vitals/Pain Pain Assessment: No/denies pain    Home Living                      Prior Function            PT Goals (current goals can now be found in the care plan section) Acute Rehab PT Goals Patient Stated Goal: to figure out what is going on PT Goal Formulation: With patient Time For Goal Achievement: 06/29/19 Potential to Achieve Goals: Good Progress towards PT goals: Progressing toward goals    Frequency    Min 3X/week      PT Plan Discharge plan needs to be updated    Co-evaluation  AM-PAC PT "6 Clicks" Mobility   Outcome Measure  Help needed turning from your back to your side while in a flat bed without using bedrails?: None Help needed moving from lying on your back to sitting on the side of a flat bed without using bedrails?: None Help needed moving to and from a bed to a chair (including a wheelchair)?: A Little Help needed standing up from a chair using your arms (e.g., wheelchair or bedside chair)?: A Little Help needed to walk in hospital room?: A Little Help needed climbing 3-5 steps with a railing? : A Lot 6 Click Score: 19    End of Session Equipment Utilized During Treatment: Gait belt Activity Tolerance: Patient tolerated treatment well Patient left: in chair;with call bell/phone within reach;with chair alarm set Nurse Communication: Mobility  status PT Visit Diagnosis: Muscle weakness (generalized) (M62.81);Unsteadiness on feet (R26.81);History of falling (Z91.81)     Time: 2641-5830 PT Time Calculation (min) (ACUTE ONLY): 15 min  Charges:  $Gait Training: 8-22 mins                     Roney [place], [place]  Acute Rehabilitation Services Pager [phone] Office [phone]    Colletta [place] 06/20/2019, 4:41 PM

## 2019-06-20 NOTE — Plan of Care (Signed)
  Problem: Education: Goal: Knowledge of General Education information will improve Description Including pain rating scale, medication(s)/side effects and non-pharmacologic comfort measures Outcome: Progressing   Problem: Health Behavior/Discharge Planning: Goal: Ability to manage health-related needs will improve Outcome: Progressing   

## 2019-06-21 LAB — IMMUNOFIXATION ELECTROPHORESIS
IgA: 596 mg/dL — ABNORMAL HIGH (ref 90–386)
IgG (Immunoglobin G), Serum: 755 mg/dL (ref 603–1613)
IgM (Immunoglobulin M), Srm: 119 mg/dL (ref 20–172)
Total Protein ELP: 6.2 g/dL (ref 6.0–8.5)

## 2019-06-21 LAB — VITAMIN B1: Vitamin B1 (Thiamine): 70.7 nmol/L (ref 66.5–200.0)

## 2019-06-21 MED ORDER — BISACODYL 10 MG RE SUPP
10.0000 mg | Freq: Once | RECTAL | Status: AC
  Administered 2019-06-21: 10 mg via RECTAL
  Filled 2019-06-21: qty 1

## 2019-06-21 NOTE — Care Management Important Message (Signed)
Important Message  Patient Details  Name: Bryce Mendoza MRN: [id number] Date of Birth: Oct 14, 1960   Medicare Important Message Given:  Yes     [name] 06/21/2019, 1:33 PM

## 2019-06-21 NOTE — Plan of Care (Signed)
  Problem: Education: Goal: Knowledge of General Education information will improve Description Including pain rating scale, medication(s)/side effects and non-pharmacologic comfort measures Outcome: Progressing   

## 2019-06-21 NOTE — Plan of Care (Signed)
  Problem: Pain Managment: Goal: General experience of comfort will improve Outcome: Progressing   

## 2019-06-21 NOTE — Progress Notes (Signed)
Occupational Therapy Treatment Patient Details Name: Bryce Mendoza MRN: [id number] DOB: 1960/05/20 Today's Date: 06/21/2019    History of present illness Pt is a 59 y/o male admitted secondary to worsening weakness and numbness in BUE and BLE. Pt reports being completely numb in trunk. Work up pending; possible GB vs MS. PMH includes DVT, PE, and empyema of lung.    OT comments  Pt making progress in therapy, demonstrating improved activity tolerance and independence with self-care and functional transfer tasks. Educated/instructed pt on safety strategies and activity pacing techniques to increase balance and prevent future falls with fair understanding. Pt able to ambulate to/from bedroom sink and stand 1 x 5 min to complete grooming/hygiene tasks with supervision. Noted 0 instances of LOB. Pt tolerated standing and ambulating additional 1 x 4 min with RW out into hallway requiring min cues on safety and occasional min assist for balance while turning. Educated/instructed pt on yellow theraputty HEP to increase hand strength and [name] required for ADLs. Pt demo good understanding of exercises requiring min cues on technique. All questions/concerns answered at this time. OT will continue to follow acutely. Continue to recommend CIR for additional rehab prior to discharge home.    Follow Up Recommendations  CIR;Supervision/Assistance - 24 hour    Equipment Recommendations  Other (comment)(TBD at next venue of care)    Recommendations for Other Services      Precautions / Restrictions Precautions Precautions: Fall Restrictions Weight Bearing Restrictions: No       Mobility Bed Mobility Overal bed mobility: Needs Assistance Bed Mobility: Supine to Sit     Supine to sit: Supervision     General bed mobility comments: HOB flat, without use of bedrails  Transfers Overall transfer level: Needs assistance Equipment used: Rolling walker (2 wheeled) Transfers: Sit to/from Stand Sit to  Stand: Min guard         General transfer comment: To ensure balance and safety    Balance Overall balance assessment: Needs assistance Sitting-balance support: No upper extremity supported;Feet supported Sitting balance-Leahy Scale: Good       Standing balance-Leahy Scale: Fair                             ADL either performed or assessed with clinical judgement   ADL Overall ADL's : Needs assistance/impaired     Grooming: Set up;Supervision/safety;Standing;Wash/dry hands;Wash/dry face;Oral care;Brushing hair Grooming Details (indicate cue type and reason): While standing at the sink Upper Body Bathing: Set up;Supervision/ safety;Standing Upper Body Bathing Details (indicate cue type and reason): While standing at the sink                         Functional mobility during ADLs: Min guard;Minimal assistance;Rolling walker General ADL Comments: Pt able to ambulate to/from bedroom sink and out in hallway with RW and variable min guard to min assist. Pt unsteady on feet with RW during turns requiring cues for safety. Pt tolerated standing 1 x 5 min at the sink to complete grooming/hygiene tasks. Pt tolerated standing additional 4 min to ambulate in hallway.      Vision       Perception     Praxis      Cognition Arousal/Alertness: Awake/alert Behavior During Therapy: WFL for tasks assessed/performed Overall Cognitive Status: Within Functional Limits for tasks assessed  General Comments: Pt pleasant and willing to participate in therapy. No difficulty following multi-step instructions        Exercises Exercises: Other exercises Other Exercises Other Exercises: Educated/instructed pt on yellow theraputty HEP with pt requiring min cues on technique.    Shoulder Instructions       General Comments No signs/symptoms of distress.     Pertinent Vitals/ Pain       Pain Assessment: No/denies  pain  Home Living                                          Prior Functioning/Environment              Frequency           Progress Toward Goals  OT Goals(current goals can now be found in the care plan section)  Progress towards OT goals: Progressing toward goals  ADL Goals Pt Will Perform Grooming: with modified independence;standing Pt Will Perform Upper Body Bathing: with modified independence;sitting;standing Pt Will Perform Lower Body Bathing: with modified independence;sit to/from stand;with adaptive equipment Pt Will Perform Upper Body Dressing: with modified independence;standing;sitting Pt Will Perform Lower Body Dressing: with modified independence;with adaptive equipment;sitting/lateral leans;sit to/from stand Pt Will Transfer to Toilet: with modified independence;ambulating;regular height toilet;grab bars Pt Will Perform Toileting - Clothing Manipulation and hygiene: with modified independence;sit to/from stand Pt Will Perform Tub/Shower Transfer: Tub transfer;with modified independence;shower seat;tub bench;rolling walker Pt/caregiver will Perform Home Exercise Program: Increased strength;Both right and left upper extremity;With theraputty;Independently;With written HEP provided  Plan Discharge plan remains appropriate    Co-evaluation                 AM-PAC OT "6 Clicks" Daily Activity     Outcome Measure   Help from another person eating meals?: None Help from another person taking care of personal grooming?: A Little Help from another person toileting, which includes using toliet, bedpan, or urinal?: A Lot Help from another person bathing (including washing, rinsing, drying)?: A Lot Help from another person to put on and taking off regular upper body clothing?: A Little Help from another person to put on and taking off regular lower body clothing?: A Lot 6 Click Score: 16    End of Session Equipment Utilized During Treatment:  Gait belt;Rolling walker  OT Visit Diagnosis: Unsteadiness on feet (R26.81);Other abnormalities of gait and mobility (R26.89);Muscle weakness (generalized) (M62.81)   Activity Tolerance Patient tolerated treatment well   Patient Left in chair;with call bell/phone within reach   Nurse Communication Mobility status        Time: 9628-3662 OT Time Calculation (min): 33 min  Charges: OT General Charges $OT Visit: 1 Visit OT Treatments $Therapeutic Activity: 8-22 mins $Therapeutic Exercise: 8-22 mins  Peterson Ao OTR/L [phone]   Peterson Ao 06/21/2019, 8:48 AM

## 2019-06-21 NOTE — Progress Notes (Signed)
Inpatient Rehabilitation Admissions Coordinator  I met with with patient at bedside to inform him that we have contacted his Health Plans incorporated and he is currently not insured. I reviewed again cost of care possible for CIR admit and patient stated he does not want to pursue due to cost. I have informed acute Team, Dr. Lorayne Bender, SW and RN CM, Ellard Artis. We will sign off at this time. Pleas call me with any questions.  Danne [facility], RN, MSN Rehab Admissions Coordinator [phone] 06/21/2019 12:54 PM

## 2019-06-21 NOTE — Progress Notes (Signed)
PROGRESS NOTE  Bryce Mendoza  CXF:[id number] DOB: 1960-05-25 DOA: 06/14/2019 PCP: Clent Demark, PA-C   Brief Narrative: Bryce Mendoza is a 59 y.o. male with a history of DVT/PE and empyema in 2019 who presented to [facility] ED 4/13 with several months of sensory changes in feet advancing to include the legs, hands, arms, and most recently the abdomen characterized as numbness and more recently including weakness in an ascending pattern. Due to concern for ascending paralysis, ED > ED transfer was performed for urgent neurology consultation at [facility]. LP performed, MRI showed no explanation for findings. Further blood work has been sent and is pending. Neurology has initiated IVIG x5 days for subacute neuropathy versus polyradiculoneuropathy.   Assessment & Plan: Principal Problem:   Ascending paralysis ([place]) Active Problems:   Hyperglycemia   Bilirubinuria   Dupuytren contracture  Subacute polyneuropathy: Sensory > motor. ESR, CRP not elevated. SSA, SSB normal. Vitamin B12 normal at 457. HIV NR, TSH wnl, CSF protein 49, normal cell count.   - Completed IVIG x5 days (4/14 - 4/18) without appreciable improvement. - Neurology has no further recommendations but to continue folic acid supplementation. - Autoimmune neuropathy and vitamin panel labs remain pending. Electrophoresis wnl. - Continue serial FVC, NIF which remain reassuring. - Continue PT/OT as much as possible.  - Added neurontin for neuropathic pain.  Folic acid deficiency: Severe. See neurology notes, this is a rarely cited cause of neuropathies consistent with this patient's findings. - Supplement daily.   Hyperglycemia: HbA1c 5.5%.  Hypokalemia:  - Supplement and continue monitoring.   Hypomagnesemia:  - Supplement and monitor.  Macrocytic anemia: Due to folic acid deficiency, supplementing as above.  Hepatic steatosis, hyperbilirubinemia:  - Fractionated bilirubin normal on 4/16  Constipation:  - Started senna, miralax  scheduled. Will give dulcolax supp and continue bowel regimen since he's require opioids for pain  DVT prophylaxis: Lovenox Code Status: DNR, confirmed with patient on 4/16. Patient would also wish to have his body donated for research. Family Communication: None at bedside Disposition Plan:  Status is: Inpatient  Remains inpatient appropriate because:Unsafe d/c plan   Dispo: The patient is from: Home              Anticipated d/c is to: TBD. The patient does not have insurance, so cannot go to CIR. I've consulted CSW for consideration of SNF as the patient remains severely disabled and his best chance at return of function is with daily therapy.               Anticipated d/c date is: 1 day              Patient currently is medically stable to d/c.  Consultants:   Neurology  PM&R  Procedures:   Lumbar puncture 06/15/2019  MRI:  IMPRESSION: 1. No acute findings or explanation for the patient's symptoms. 2. The spinal cord appears normal without evidence of myelopathy, abnormal intradural enhancement or compression. 3. Mild spondylosis as described. At C5-6, there is resulting mild spinal stenosis with moderate right-greater-than-left foraminal narrowing. 4. No other significant spinal stenosis or nerve root encroachment. 5. Facet arthropathy in the cervical and lumbar spine as detailed above.  Antimicrobials:  None   Subjective: No significant change in numbness or weakness. Pain is improved with medications, but remains moderate. Has not had BM.  Objective: Vitals:   06/20/19 1616 06/20/19 2029 06/21/19 0458 06/21/19 0855  BP: 130/81 140/82 100/77 123/86  Pulse: 78 72 74 91  Resp: 18  '20 18 18  ' Temp: 97.8 F (36.6 C) 98.9 F (37.2 C) 98.8 F (37.1 C) 98.3 F (36.8 C)  TempSrc: Oral Oral Oral Oral  SpO2: 95% 94% 98% 95%  Weight:      Height:        Intake/Output Summary (Last 24 hours) at 06/21/2019 1555 Last data filed at 06/21/2019 1300 Gross per 24 hour    Intake 980 ml  Output 0 ml  Net 980 ml   Filed Weights   06/15/19 0915 06/18/19 2147 06/19/19 2131  Weight: 113.4 kg 124.8 kg 125.6 kg   Gen: 59 y.o. male in no distress Pulm: Nonlabored breathing room air. Clear. CV: Regular rate and rhythm. No murmur, rub, or gallop. No JVD, no dependent edema. GI: Abdomen soft, non-tender, non-distended, with normoactive bowel sounds.  Ext: Warm, no deformities Skin: No new rashes, lesions or ulcers on visualized skin. Neuro: Alert and oriented. Impaired sensation in abdomen extending throughout all 4 extremities, apraxia and diminished strength in hands > shoulders and throughout LE's. Psych: Judgement and insight appear fair. Mood euthymic & affect congruent. Behavior is appropriate.    Data Reviewed: I have personally reviewed following labs and imaging studies  CBC: Recent Labs  Lab 06/14/19 1752 06/15/19 0840 06/16/19 0711  WBC 8.9 8.5 8.1  NEUTROABS 5.3 4.8 6.9  HGB 15.1 13.6 12.2*  HCT 44.1 39.4 35.7*  MCV 110.5* 111.3* 110.5*  PLT 221 207 208   Basic Metabolic Panel: Recent Labs  Lab 06/14/19 1752 06/15/19 0840 06/16/19 0711 06/17/19 0354  NA 138 136 132* 135  K 3.6 4.0 3.6 3.2*  CL 103 104 102 102  CO2 22 21* 20* 24  GLUCOSE 114* 99 103* 83  BUN '12 11 13 10  ' CREATININE 0.89 0.87 0.88 0.79  CALCIUM 8.9 8.6* 8.5* 8.7*  MG  --   --  1.5*  --   PHOS  --  4.3  --   --    GFR: Estimated Creatinine Clearance: 141.2 mL/min (by C-G formula based on SCr of 0.79 mg/dL). Liver Function Tests: Recent Labs  Lab 06/14/19 1752 06/15/19 0256 06/15/19 0840 06/16/19 0711 06/17/19 0354  AST 35  --   --  23  --   ALT 23  --   --  15  --   ALKPHOS 83  --   --  66  --   BILITOT 2.3*  --   --  1.8* 1.0  PROT 7.4  --   --  6.7  --   ALBUMIN 3.7 3.6* 3.2* 2.9*  --     Anemia Panel: No results for input(s): VITAMINB12, FOLATE, FERRITIN, TIBC, IRON, RETICCTPCT in the last 72 hours. Urine analysis:    Component Value Date/Time    COLORURINE RED (A) 06/14/2019 1929   APPEARANCEUR TURBID (A) 06/14/2019 1929   LABSPEC 1.027 06/14/2019 1929   PHURINE 5.0 06/14/2019 1929   GLUCOSEU NEGATIVE 06/14/2019 [place] NEGATIVE 06/14/2019 1929   BILIRUBINUR SMALL (A) 06/14/2019 [place] NEGATIVE 06/14/2019 1929   PROTEINUR 30 (A) 06/14/2019 1929   NITRITE NEGATIVE 06/14/2019 1929   LEUKOCYTESUR NEGATIVE 06/14/2019 1929   Recent Results (from the past 240 hour(s))  Culture, blood (routine x 2)     Status: None   Collection Time: 06/15/19  1:15 AM   Specimen: BLOOD  Result Value Ref Range Status   Specimen Description BLOOD RIGHT HAND  Final   Special Requests   Final  BOTTLES DRAWN AEROBIC AND ANAEROBIC Blood Culture results may not be optimal due to an inadequate volume of blood received in culture bottles   Culture   Final    NO GROWTH 5 DAYS Performed at [facility], [place] [address], [place], [place] [address]    Report Status 06/20/2019 FINAL  Final  Culture, blood (routine x 2)     Status: None   Collection Time: 06/15/19  1:47 AM   Specimen: BLOOD  Result Value Ref Range Status   Specimen Description BLOOD LEFT ARM  Final   Special Requests   Final    BOTTLES DRAWN AEROBIC AND ANAEROBIC Blood Culture adequate volume   Culture   Final    NO GROWTH 5 DAYS Performed at [facility], [place] [address] [place], [place] [address]    Report Status 06/20/2019 FINAL  Final  CSF culture     Status: None   Collection Time: 06/15/19  2:53 AM   Specimen: CSF; Cerebrospinal Fluid  Result Value Ref Range Status   Specimen Description CSF  Final   Special Requests NONE  Final   Gram Stain CYTOSPIN SMEAR NO WBC SEEN NO ORGANISMS SEEN   Final   Culture   Final    NO GROWTH 3 DAYS Performed at [facility], [place] [address] [place], [place] [address]    Report Status 06/18/2019 FINAL  Final  SARS CORONAVIRUS 2 (TAT 6-24 HRS) Nasopharyngeal Nasopharyngeal Swab     Status: None    Collection Time: 06/15/19  5:24 AM   Specimen: Nasopharyngeal Swab  Result Value Ref Range Status   SARS Coronavirus 2 NEGATIVE NEGATIVE Final    Comment: (NOTE) SARS-CoV-2 target nucleic acids are NOT DETECTED. The SARS-CoV-2 RNA is generally detectable in upper and lower respiratory specimens during the acute phase of infection. Negative results do not preclude SARS-CoV-2 infection, do not rule out co-infections with other pathogens, and should not be used as the sole basis for treatment or other patient management decisions. Negative results must be combined with clinical observations, patient history, and epidemiological information. The expected result is Negative. Fact Sheet for Patients: SugarRoll.be Fact Sheet for Healthcare Providers: https://www.woods-mathews.com/ This test is not yet approved or cleared by the [place] FDA and  has been authorized for detection and/or diagnosis of SARS-CoV-2 by FDA under an Emergency Use Authorization (EUA). This EUA will remain  in effect (meaning this test can be used) for the duration of the COVID-19 declaration under Section 56 4(b)(1) of the Act, 21 U.S.C. section 360bbb-3(b)(1), unless the authorization is terminated or revoked sooner. Performed at [facility], [place] [address], [place], [place] [address]   Culture, Urine     Status: None   Collection Time: 06/15/19  6:08 AM   Specimen: Urine, Random  Result Value Ref Range Status   Specimen Description URINE, RANDOM  Final   Special Requests NONE  Final   Culture   Final    NO GROWTH Performed at [facility], [place] [address] [place], [place] [address]    Report Status 06/16/2019 FINAL  Final      Radiology Studies: No results found.  Scheduled Meds: . bisacodyl  10 mg Rectal Once  . enoxaparin (LOVENOX) injection  40 mg Subcutaneous Q24H  . feeding supplement (ENSURE ENLIVE)  237 mL Oral BID BM  . folic acid  2  mg Oral Daily  . gabapentin  300 mg Oral TID  .  multivitamin with minerals  1 tablet Oral QHS  . polyethylene glycol  17 g Oral Daily  . senna-docusate  2 tablet Oral BID   Continuous Infusions:    LOS: 6 days   Time spent: 25 minutes.  Patrecia Pour, MD [facility] [email or web address] 06/21/2019, 3:55 PM

## 2019-06-21 NOTE — TOC Initial Note (Signed)
Transition of Care [facility]) - Initial/Assessment Note    Patient Details  Name: Bryce Mendoza MRN: [id number] Date of Birth: [birth year]  [facility] of [facility] CM/SW Contact:    Cristobal Goldmann, LCSW Phone Number: 06/21/2019, 4:39 PM  Clinical Narrative:  CSW was advised by nursing that patient requested to speak someone regarding SSA disability. CSW talked initially with patient regarding his employer and insurance coverage. Patient reported that he was working and had to stop working because of "this", meaning his health issues. Bryce Mendoza indicated that he has not worked since Feb. 2021 and was not sure if he would be returning to work. 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 Patient reported that he has major medical insurance package with a high deductible, and he added that he currently has no sick or vacation time. Talked with patient regarding the recommendation of ST rehab and not having a payor listed to pay for ST rehab, and Bryce Mendoza expressed understanding. CSW explained the eligibility requirements for Medicaid.    Epic checked to determine insurance coverage for patient and information on [employer] shown (CIR's contact to verify coverage) and the insurance company was contacted and indicated that they did not have anyone listed in their system with the DOB provided.   CSW and nurse case manager talked with patient later today (4:11 pm) and informed him that it was confirmed that he has no insurance. Bryce Mendoza indicated that he is still on the payroll and thought he had insurance, as he paid into it every month. He also reported that he talks with his supervisor every day. Patient added that he only has only has $75 in his checking account and no savings. Bryce Mendoza added that one of his friends mentioned that they may do a Go-Fund-Me for him, as he has no money to pay his bills.Bryce Mendoza reported that he lives with a male  roommate who has health issues and he helps her out at home with meals, etc. Patient also reported that he has 2 sisters and both live out of town. Patient advised that our system is showing that he has a pending Medicaid application, and he does not remember talking with anyone. When asked, Bryce Mendoza responded that he does not have a PCP, and available health and medication resources explained to him by RN Case Manager.     CSW will continue to follow and provide SW interventions services as needed through discharge.      Expected Discharge Plan: Home w Home Health Services Barriers to Discharge: Continued Medical Work up   Patient Goals and CMS Choice Patient states their goals for this hospitalization and ongoing recovery are:: Patient anticipates going home at discharge CMS Medicare.gov Compare Post Acute Care list provided to:: Other (Comment Required)(SNF list not needed. Home health will be charity, as patient has no insurance at this time) Choice offered to / list presented to : NA  Expected Discharge Plan and Services Expected Discharge Plan: Home w Home Health Services   Discharge Planning Services: Other - See comment(CSW and nuse case manager have spoken with patient)     Expected Discharge Date: 06/21/19                                    Prior Living Arrangements/Services   Lives with:: Roommate Patient language and need for interpreter reviewed:: No Do  you feel safe going back to the place where you live?: Yes      Need for Family Participation in Patient Care: No (Comment) Care giver support system in place?: Yes (comment)(Patient has a roommate and supportive friends)   Criminal Activity/Legal Involvement Pertinent to Current Situation/Hospitalization: No - Comment as needed  Activities of Daily Living Home Assistive Devices/Equipment: Other (Comment)(" RAILINGS TO HANG ON TO THINGS IN [place] ") ADL Screening (condition at time of admission) Patient's  cognitive ability adequate to safely complete daily activities?: Yes Is the patient deaf or have difficulty hearing?: No Does the patient have difficulty seeing, even when wearing glasses/contacts?: No Does the patient have difficulty concentrating, remembering, or making decisions?: No Patient able to express need for assistance with ADLs?: Yes Does the patient have difficulty dressing or bathing?: Yes Independently performs ADLs?: Yes (appropriate for developmental age) Does the patient have difficulty walking or climbing stairs?: Yes Weakness of Legs: Both Weakness of Arms/Hands: Both  Permission Sought/Granted Permission sought to share information with : Other (comment)(Have not requested to speak with any additional persons at this point) Permission granted to share information with : No(Patient alert and oriented, no need to speak with roommate or family.)              Emotional Assessment Appearance:: Appears stated age Attitude/Demeanor/Rapport: Engaged Affect (typically observed): Appropriate Orientation: : Oriented to Self, Oriented to Place, Oriented to  Time, Oriented to Situation Alcohol / Substance Use: Tobacco Use, Alcohol Use, Illicit Drugs(Per H&P, patient reported that he quit smoking and has previous alcohol and drug use) Psych Involvement: No (comment)  Admission diagnosis:  Ascending paralysis ([place]) [G61.0] Weakness [R53.1] Liver function abnormality [R94.5] Patient Active Problem List   Diagnosis Date Noted  . Ascending paralysis ([place]) 06/15/2019  . Hyperglycemia 06/15/2019  . Bilirubinuria 06/15/2019  . Dupuytren contracture 06/15/2019   PCP:  Clent Demark, PA-C Pharmacy:   [id number] - [place], [place] [address] [place] [address] Phone: [phone] Fax: [phone]     Social Determinants of Health (SDOH) Interventions  Patient currently has no income and means to pay his bills. His friends may provide  assistance via a Go-Fund-Me page.  Readmission Risk Interventions No flowsheet data found.

## 2019-06-22 DIAGNOSIS — R822 Biliuria: Secondary | ICD-10-CM

## 2019-06-22 LAB — VITAMIN E
Vitamin E (Alpha Tocopherol): 7.6 mg/L (ref 7.0–25.1)
Vitamin E(Gamma Tocopherol): 1 mg/L (ref 0.5–5.5)

## 2019-06-22 MED ORDER — TRAMADOL HCL 50 MG PO TABS
50.0000 mg | ORAL_TABLET | Freq: Four times a day (QID) | ORAL | Status: DC | PRN
  Administered 2019-06-22: 50 mg via ORAL
  Filled 2019-06-22: qty 1

## 2019-06-22 MED ORDER — SENNOSIDES-DOCUSATE SODIUM 8.6-50 MG PO TABS: 2.0000 | ORAL_TABLET | Freq: Two times a day (BID) | ORAL | 0 refills | Status: AC

## 2019-06-22 MED ORDER — POLYETHYLENE GLYCOL 3350 17 G PO PACK: 17.0000 g | PACK | Freq: Every day | ORAL | 0 refills | Status: DC | PRN

## 2019-06-22 MED ORDER — TRAMADOL HCL 50 MG PO TABS: 50.0000 mg | ORAL_TABLET | Freq: Four times a day (QID) | ORAL | 0 refills | Status: AC | PRN

## 2019-06-22 MED ORDER — GABAPENTIN 300 MG PO CAPS: 300.0000 mg | ORAL_CAPSULE | Freq: Three times a day (TID) | ORAL | 0 refills | Status: DC

## 2019-06-22 MED ORDER — FOLIC ACID 1 MG PO TABS: 2.0000 mg | ORAL_TABLET | Freq: Every day | ORAL | 0 refills | Status: DC

## 2019-06-22 MED FILL — POLYETHYLENE GLYCOL 3350 PO: 17 | 14 days supply | Qty: 238 | Fill #0

## 2019-06-22 MED FILL — GABAPENTIN 300 MG CAPSULE: 300 | 30 days supply | Qty: 90 | Fill #0

## 2019-06-22 MED FILL — SENEXON-S 8.6-50 MG TABS: 8.6-50 | 7 days supply | Qty: 28 | Fill #0

## 2019-06-22 MED FILL — traMADol HCL 50 MG TABS: 50 | 5 days supply | Qty: 20 | Fill #0

## 2019-06-22 MED FILL — FOLIC ACID 1 MG TABS: 1 | 30 days supply | Qty: 60 | Fill #0

## 2019-06-22 NOTE — Discharge Summary (Addendum)
Physician Discharge Summary  Shantel Wesely ZOX:[id number] DOB: Jun 08, 1960 DOA: 06/14/2019  PCP: Loletta Specter, PA-C  Admit date: 06/14/2019 Discharge date: 06/22/2019  Admitted From: Home Disposition: Home  Recommendations for Outpatient Follow-up:  1. Follow up with PCP in 1-2 weeks 2. Please obtain BMP/CBC in one week  Home Health: Yes Equipment/Devices: Walker  Discharge Condition: Stable CODE STATUS: DNR Diet recommendation: As tolerated  Brief/Interim Summary: Giles Currie is a 59 y.o. male with a history of DVT/PE and empyema in 2019 who presented to [facility] ED 4/13 with several months of sensory changes in feet advancing to include the legs, hands, arms, and most recently the abdomen characterized as numbness and more recently including weakness in an ascending pattern. Due to concern for ascending paralysis, ED > ED transfer was performed for urgent neurology consultation at [facility]. LP performed, MRI showed no explanation for findings. Further blood work has been sent and is pending. Neurology has initiated IVIG x5 days for subacute neuropathy versus polyradiculoneuropathy  Patient admitted as above with somewhat subacute episode of ambulatory dysfunction and generalized weakness.  Evaluated by neurology, LP and MRI were unremarkable, patient received IVIG for 5 days given concern for subacute neuropathy versus polyradiculoneuropathy with very minimal improvement.  Patient continues to undergo physical therapy who indicate he is improving somewhat daily but unfortunately his disposition is limited given his lack of funding and insurance.  At this time patient has improved on folic acid supplementation as improve diet.  Patient's other lab abnormalities including hypokalemia hypomagnesemia and microcytic anemia are all stable likely due to poor nutritional status.  Patient is otherwise stable for discharge home with home health with continued physical therapy and medication assistance from  our facility to fill his gabapentin, tramadol and multivitamin.  Patient need close follow-up with PCP and may benefit from further evaluation with neurology in the near future if his symptoms do not continue to improve with improved diet and supplementation.   Discharge Diagnoses:  Principal Problem:   Ascending paralysis (HCC) Active Problems:   Hyperglycemia   Bilirubinuria   Dupuytren contracture  Discharge Instructions  Discharge Instructions    Call MD for:  difficulty breathing, headache or visual disturbances   Complete by: As directed    Call MD for:  extreme fatigue   Complete by: As directed    Call MD for:  hives   Complete by: As directed    Call MD for:  persistant dizziness or light-headedness   Complete by: As directed    Call MD for:  persistant nausea and vomiting   Complete by: As directed    Call MD for:  severe uncontrolled pain   Complete by: As directed    Call MD for:  temperature >100.4   Complete by: As directed    Diet - low sodium heart healthy   Complete by: As directed    Increase activity slowly   Complete by: As directed      Allergies as of 06/22/2019   No Known Allergies     Medication List    STOP taking these medications   apixaban 5 MG Tabs tablet Commonly known as: ELIQUIS   levofloxacin 750 MG tablet Commonly known as: Levaquin   NONFORMULARY OR COMPOUNDED ITEM     TAKE these medications   acetaminophen 500 MG tablet Commonly known as: TYLENOL Take 500-1,000 mg by mouth every 6 (six) hours as needed for mild pain or moderate pain.   folic acid 1 MG tablet Commonly  known as: FOLVITE Take 2 tablets (2 mg total) by mouth daily. Start taking on: June 23, 2019   gabapentin 300 MG capsule Commonly known as: NEURONTIN Take 1 capsule (300 mg total) by mouth 3 (three) times daily.   polyethylene glycol 17 g packet Commonly known as: MIRALAX / GLYCOLAX Take 17 g by mouth daily as needed for up to 14 doses.    senna-docusate 8.6-50 MG tablet Commonly known as: Senokot-S Take 2 tablets by mouth 2 (two) times daily for 7 days.   traMADol 50 MG tablet Commonly known as: ULTRAM Take 1 tablet (50 mg total) by mouth every 6 (six) hours as needed for up to 5 days for severe pain.            Durable Medical Equipment  (From admission, onward)         Start     Ordered   06/22/19 1445  DME Walker  Once    Question Answer Comment  Walker: With 5 Inch Wheels   Patient needs a walker to treat with the following condition Ambulatory dysfunction      06/22/19 1446   06/21/19 1702  For home use only DME Walker rolling  Once    Question Answer Comment  Walker: With 5 Inch Wheels   Patient needs a walker to treat with the following condition Polyneuropathy   Patient needs a walker to treat with the following condition Gait instability   Patient needs a walker to treat with the following condition Ascending paralysis ([place])      06/21/19 1701   06/21/19 1702  For home use only DME 3 n 1  Once     06/21/19 1701          No Known Allergies  Consultations:  Neurology   Procedures/Studies: CT Head Wo Contrast  Result Date: 06/14/2019 CLINICAL DATA:  Ataxia rule out stroke EXAM: CT HEAD WITHOUT CONTRAST TECHNIQUE: Contiguous axial images were obtained from the base of the skull through the vertex without intravenous contrast. COMPARISON:  None. FINDINGS: Brain: No evidence of acute infarction, hemorrhage, hydrocephalus, extra-axial collection or mass lesion/mass effect. Mild atrophy. Vascular: Negative for hyperdense vessel Skull: Negative Sinuses/Orbits: Negative Other: None IMPRESSION: Mild atrophy.  No acute abnormality. Electronically Signed   By: Franchot Gallo M.D.   On: 06/14/2019 20:11   MR CERVICAL SPINE W WO CONTRAST  Result Date: 06/15/2019 CLINICAL DATA:  Progressive weakness over the last 2 months. Suspected myelopathy. History of empyema and DVT. EXAM: MRI CERVICAL, THORACIC  AND LUMBAR SPINE WITHOUT AND WITH CONTRAST TECHNIQUE: Multiplanar and multiecho pulse sequences of the cervical spine, to include the craniocervical junction and cervicothoracic junction, and thoracic and lumbar spine, were obtained without and with intravenous contrast. CONTRAST:  93mL GADAVIST GADOBUTROL 1 MMOL/ML IV SOLN COMPARISON:  CT chest 05/16/2019. FINDINGS: MRI CERVICAL SPINE FINDINGS Alignment: Straightening without focal angulation or significant listhesis. Vertebrae: No acute or suspicious osseous findings. Cord: Normal in signal and caliber. No abnormal intradural enhancement. Posterior Fossa, vertebral arteries, paraspinal tissues: Visualized portions of the posterior fossa and paraspinal soft tissues appear unremarkable. Bilateral vertebral artery flow voids. Disc levels: C2-3: The disc appears normal. Asymmetric facet hypertrophy on the left contributes to mild left foraminal narrowing. C3-4: Mild bilateral facet hypertrophy. No spinal stenosis or nerve root encroachment. C4-5: Small right paracentral disc protrusion and mild bilateral facet hypertrophy. No cord deformity or foraminal compromise. C5-6: Spondylosis with loss of disc height, posterior osteophytes and bilateral uncinate spurring. The  CSF surrounding the cord is largely effaced without cord deformity. Moderate right-greater-than-left foraminal narrowing could affect the exiting C6 nerve roots. C6-7: Spondylosis with a shallow left paracentral disc protrusion and bilateral uncinate spurring. No cord deformity. Mild left greater than right foraminal narrowing. C7-T1: Normal interspace. MRI THORACIC SPINE FINDINGS Alignment: Normal. Vertebrae: No acute or suspicious osseous findings. Cord: The thoracic cord is normal in signal and caliber. There is no abnormal intradural enhancement. The thoracic spinal canal is widely patent. Paraspinal and other soft tissues: No significant paraspinal abnormalities. Subpleural scarring posteriorly in the  left lower lobe appears similar to previous CT. Disc levels: There are only mild degenerative changes within the thoracic spine with disc bulging greatest at T2-3. No cord deformity or significant foraminal narrowing. MRI LUMBAR SPINE FINDINGS Segmentation: There are 5 lumbar type vertebral bodies. Alignment: Normal. Vertebrae: No worrisome osseous lesion, acute fracture or pars defect. The visualized sacroiliac joints appear unremarkable. Conus medullaris: Extends to the L1 level and appears normal. No abnormal intradural enhancement. Paraspinal and other soft tissues: No significant paraspinal findings. Mildly prominent epidural fat in the lower lumbar spinal canal. Disc levels: No significant disc space findings at L1-2 or L2-3. L3-4: Preserved disc height with mild disc bulging eccentric to the left. Mild facet and ligamentous hypertrophy. No spinal stenosis or nerve root encroachment. L4-5: Preserved disc height with mild disc bulging. Moderate facet and ligamentous hypertrophy. No significant spinal stenosis or nerve root encroachment. L5-S1: Chronic loss of disc height with annular disc bulging and endplate osteophytes. Mild bilateral facet hypertrophy. Minimal inferior foraminal narrowing bilaterally without nerve root encroachment. IMPRESSION: 1. No acute findings or explanation for the patient's symptoms. 2. The spinal cord appears normal without evidence of myelopathy, abnormal intradural enhancement or compression. 3. Mild spondylosis as described. At C5-6, there is resulting mild spinal stenosis with moderate right-greater-than-left foraminal narrowing. 4. No other significant spinal stenosis or nerve root encroachment. 5. Facet arthropathy in the cervical and lumbar spine as detailed above. Electronically Signed   By: Carey BullocksWilliam  Veazey M.D.   On: 06/15/2019 08:27   MR THORACIC SPINE W WO CONTRAST  Result Date: 06/15/2019 CLINICAL DATA:  Progressive weakness over the last 2 months. Suspected  myelopathy. History of empyema and DVT. EXAM: MRI CERVICAL, THORACIC AND LUMBAR SPINE WITHOUT AND WITH CONTRAST TECHNIQUE: Multiplanar and multiecho pulse sequences of the cervical spine, to include the craniocervical junction and cervicothoracic junction, and thoracic and lumbar spine, were obtained without and with intravenous contrast. CONTRAST:  10mL GADAVIST GADOBUTROL 1 MMOL/ML IV SOLN COMPARISON:  CT chest 05/16/2019. FINDINGS: MRI CERVICAL SPINE FINDINGS Alignment: Straightening without focal angulation or significant listhesis. Vertebrae: No acute or suspicious osseous findings. Cord: Normal in signal and caliber. No abnormal intradural enhancement. Posterior Fossa, vertebral arteries, paraspinal tissues: Visualized portions of the posterior fossa and paraspinal soft tissues appear unremarkable. Bilateral vertebral artery flow voids. Disc levels: C2-3: The disc appears normal. Asymmetric facet hypertrophy on the left contributes to mild left foraminal narrowing. C3-4: Mild bilateral facet hypertrophy. No spinal stenosis or nerve root encroachment. C4-5: Small right paracentral disc protrusion and mild bilateral facet hypertrophy. No cord deformity or foraminal compromise. C5-6: Spondylosis with loss of disc height, posterior osteophytes and bilateral uncinate spurring. The CSF surrounding the cord is largely effaced without cord deformity. Moderate right-greater-than-left foraminal narrowing could affect the exiting C6 nerve roots. C6-7: Spondylosis with a shallow left paracentral disc protrusion and bilateral uncinate spurring. No cord deformity. Mild left greater than right foraminal narrowing.  C7-T1: Normal interspace. MRI THORACIC SPINE FINDINGS Alignment: Normal. Vertebrae: No acute or suspicious osseous findings. Cord: The thoracic cord is normal in signal and caliber. There is no abnormal intradural enhancement. The thoracic spinal canal is widely patent. Paraspinal and other soft tissues: No  significant paraspinal abnormalities. Subpleural scarring posteriorly in the left lower lobe appears similar to previous CT. Disc levels: There are only mild degenerative changes within the thoracic spine with disc bulging greatest at T2-3. No cord deformity or significant foraminal narrowing. MRI LUMBAR SPINE FINDINGS Segmentation: There are 5 lumbar type vertebral bodies. Alignment: Normal. Vertebrae: No worrisome osseous lesion, acute fracture or pars defect. The visualized sacroiliac joints appear unremarkable. Conus medullaris: Extends to the L1 level and appears normal. No abnormal intradural enhancement. Paraspinal and other soft tissues: No significant paraspinal findings. Mildly prominent epidural fat in the lower lumbar spinal canal. Disc levels: No significant disc space findings at L1-2 or L2-3. L3-4: Preserved disc height with mild disc bulging eccentric to the left. Mild facet and ligamentous hypertrophy. No spinal stenosis or nerve root encroachment. L4-5: Preserved disc height with mild disc bulging. Moderate facet and ligamentous hypertrophy. No significant spinal stenosis or nerve root encroachment. L5-S1: Chronic loss of disc height with annular disc bulging and endplate osteophytes. Mild bilateral facet hypertrophy. Minimal inferior foraminal narrowing bilaterally without nerve root encroachment. IMPRESSION: 1. No acute findings or explanation for the patient's symptoms. 2. The spinal cord appears normal without evidence of myelopathy, abnormal intradural enhancement or compression. 3. Mild spondylosis as described. At C5-6, there is resulting mild spinal stenosis with moderate right-greater-than-left foraminal narrowing. 4. No other significant spinal stenosis or nerve root encroachment. 5. Facet arthropathy in the cervical and lumbar spine as detailed above. Electronically Signed   By: Carey Bullocks M.D.   On: 06/15/2019 08:27   MR Lumbar Spine W Wo Contrast  Result Date:  06/15/2019 CLINICAL DATA:  Progressive weakness over the last 2 months. Suspected myelopathy. History of empyema and DVT. EXAM: MRI CERVICAL, THORACIC AND LUMBAR SPINE WITHOUT AND WITH CONTRAST TECHNIQUE: Multiplanar and multiecho pulse sequences of the cervical spine, to include the craniocervical junction and cervicothoracic junction, and thoracic and lumbar spine, were obtained without and with intravenous contrast. CONTRAST:  41mL GADAVIST GADOBUTROL 1 MMOL/ML IV SOLN COMPARISON:  CT chest 05/16/2019. FINDINGS: MRI CERVICAL SPINE FINDINGS Alignment: Straightening without focal angulation or significant listhesis. Vertebrae: No acute or suspicious osseous findings. Cord: Normal in signal and caliber. No abnormal intradural enhancement. Posterior Fossa, vertebral arteries, paraspinal tissues: Visualized portions of the posterior fossa and paraspinal soft tissues appear unremarkable. Bilateral vertebral artery flow voids. Disc levels: C2-3: The disc appears normal. Asymmetric facet hypertrophy on the left contributes to mild left foraminal narrowing. C3-4: Mild bilateral facet hypertrophy. No spinal stenosis or nerve root encroachment. C4-5: Small right paracentral disc protrusion and mild bilateral facet hypertrophy. No cord deformity or foraminal compromise. C5-6: Spondylosis with loss of disc height, posterior osteophytes and bilateral uncinate spurring. The CSF surrounding the cord is largely effaced without cord deformity. Moderate right-greater-than-left foraminal narrowing could affect the exiting C6 nerve roots. C6-7: Spondylosis with a shallow left paracentral disc protrusion and bilateral uncinate spurring. No cord deformity. Mild left greater than right foraminal narrowing. C7-T1: Normal interspace. MRI THORACIC SPINE FINDINGS Alignment: Normal. Vertebrae: No acute or suspicious osseous findings. Cord: The thoracic cord is normal in signal and caliber. There is no abnormal intradural enhancement. The  thoracic spinal canal is widely patent. Paraspinal and other soft  tissues: No significant paraspinal abnormalities. Subpleural scarring posteriorly in the left lower lobe appears similar to previous CT. Disc levels: There are only mild degenerative changes within the thoracic spine with disc bulging greatest at T2-3. No cord deformity or significant foraminal narrowing. MRI LUMBAR SPINE FINDINGS Segmentation: There are 5 lumbar type vertebral bodies. Alignment: Normal. Vertebrae: No worrisome osseous lesion, acute fracture or pars defect. The visualized sacroiliac joints appear unremarkable. Conus medullaris: Extends to the L1 level and appears normal. No abnormal intradural enhancement. Paraspinal and other soft tissues: No significant paraspinal findings. Mildly prominent epidural fat in the lower lumbar spinal canal. Disc levels: No significant disc space findings at L1-2 or L2-3. L3-4: Preserved disc height with mild disc bulging eccentric to the left. Mild facet and ligamentous hypertrophy. No spinal stenosis or nerve root encroachment. L4-5: Preserved disc height with mild disc bulging. Moderate facet and ligamentous hypertrophy. No significant spinal stenosis or nerve root encroachment. L5-S1: Chronic loss of disc height with annular disc bulging and endplate osteophytes. Mild bilateral facet hypertrophy. Minimal inferior foraminal narrowing bilaterally without nerve root encroachment. IMPRESSION: 1. No acute findings or explanation for the patient's symptoms. 2. The spinal cord appears normal without evidence of myelopathy, abnormal intradural enhancement or compression. 3. Mild spondylosis as described. At C5-6, there is resulting mild spinal stenosis with moderate right-greater-than-left foraminal narrowing. 4. No other significant spinal stenosis or nerve root encroachment. 5. Facet arthropathy in the cervical and lumbar spine as detailed above. Electronically Signed   By: Carey Bullocks M.D.   On:  06/15/2019 08:27   DG Chest Port 1 View  Result Date: 06/14/2019 CLINICAL DATA:  Weakness, fatigue EXAM: PORTABLE CHEST 1 VIEW COMPARISON:  10/29/2017 FINDINGS: Two frontal views of the chest demonstrate chronic scarring and pleural thickening at the left lung base. No acute airspace disease, effusion, or pneumothorax. Cardiac silhouette is unremarkable. IMPRESSION: 1. Chronic scarring and pleural thickening left lung base. 2. No acute process. Electronically Signed   By: Sharlet Salina M.D.   On: 06/14/2019 18:57   US Abdomen Limited RUQ  Result Date: 06/15/2019 CLINICAL DATA:  Elevated LFTs EXAM: ULTRASOUND ABDOMEN LIMITED RIGHT UPPER QUADRANT COMPARISON:  None. FINDINGS: Gallbladder: No gallstones, gallbladder wall thickening, or pericholecystic fluid. Negative sonographic Murphy's sign. Common bile duct: Diameter: 5 mm Liver: Hyperechoic hepatic parenchyma, suggesting hepatic steatosis. No focal hepatic lesion is seen. Portal vein is patent on color Doppler imaging with normal direction of blood flow towards the liver. Other: None. IMPRESSION: Hepatic steatosis.  No focal hepatic lesion is seen. Electronically Signed   By: Charline Bills M.D.   On: 06/15/2019 09:11    Subjective: No acute issues or events overnight, patient still indicates he is markedly weak but improving from previous, denies chest pain, shortness of breath, nausea, vomiting, diarrhea, constipation, headache, fevers, chills.  Discharge Exam: Vitals:   06/22/19 0508 06/22/19 0900  BP: (!) 120/91 125/88  Pulse: 75 80  Resp: 20 18  Temp: 98.5 F (36.9 C) 98 F (36.7 C)  SpO2: 97% 98%   Vitals:   06/21/19 1607 06/21/19 2033 06/22/19 0508 06/22/19 0900  BP: 111/74 103/87 (!) 120/91 125/88  Pulse: 81 78 75 80  Resp: 18  20 18   Temp: 98 F (36.7 C) 98.5 F (36.9 C) 98.5 F (36.9 C) 98 F (36.7 C)  TempSrc: Oral Oral Oral Oral  SpO2: 95% 96% 97% 98%  Weight:      Height:       General:  Pleasantly resting in  bed, No acute distress. HEENT:  Normocephalic atraumatic.  Sclerae nonicteric, noninjected.  Extraocular movements intact bilaterally. Neck:  Without mass or deformity.  Trachea is midline. Lungs:  Clear to auscultate bilaterally without rhonchi, wheeze, or rales. Heart:  Regular rate and rhythm.  Without murmurs, rubs, or gallops. Abdomen:  Soft, nontender, nondistended.  Without guarding or rebound. Extremities: Without cyanosis, clubbing, edema, or obvious deformity. Vascular:  Dorsalis pedis and posterior tibial pulses palpable bilaterally. Skin:  Warm and dry, no erythema, no ulcerations.   The results of significant diagnostics from this hospitalization (including imaging, microbiology, ancillary and laboratory) are listed below for reference.     Microbiology: Recent Results (from the past 240 hour(s))  Culture, blood (routine x 2)     Status: None   Collection Time: 06/15/19  1:15 AM   Specimen: BLOOD  Result Value Ref Range Status   Specimen Description BLOOD RIGHT HAND  Final   Special Requests   Final    BOTTLES DRAWN AEROBIC AND ANAEROBIC Blood Culture results may not be optimal due to an inadequate volume of blood received in culture bottles   Culture   Final    NO GROWTH 5 DAYS Performed at [facility], [address] [place], [place] [address]    Report Status 06/20/2019 FINAL  Final  Culture, blood (routine x 2)     Status: None   Collection Time: 06/15/19  1:47 AM   Specimen: BLOOD  Result Value Ref Range Status   Specimen Description BLOOD LEFT ARM  Final   Special Requests   Final    BOTTLES DRAWN AEROBIC AND ANAEROBIC Blood Culture adequate volume   Culture   Final    NO GROWTH 5 DAYS Performed at [facility], [address] [place], [place] [address]    Report Status 06/20/2019 FINAL  Final  CSF culture     Status: None   Collection Time: 06/15/19  2:53 AM   Specimen: CSF; Cerebrospinal Fluid  Result Value Ref Range Status   Specimen  Description CSF  Final   Special Requests NONE  Final   Gram Stain CYTOSPIN SMEAR NO WBC SEEN NO ORGANISMS SEEN   Final   Culture   Final    NO GROWTH 3 DAYS Performed at [facility], [address] [place], [place] [address]    Report Status 06/18/2019 FINAL  Final  SARS CORONAVIRUS 2 (TAT 6-24 HRS) Nasopharyngeal Nasopharyngeal Swab     Status: None   Collection Time: 06/15/19  5:24 AM   Specimen: Nasopharyngeal Swab  Result Value Ref Range Status   SARS Coronavirus 2 NEGATIVE NEGATIVE Final    Comment: (NOTE) SARS-CoV-2 target nucleic acids are NOT DETECTED. The SARS-CoV-2 RNA is generally detectable in upper and lower respiratory specimens during the acute phase of infection. Negative results do not preclude SARS-CoV-2 infection, do not rule out co-infections with other pathogens, and should not be used as the sole basis for treatment or other patient management decisions. Negative results must be combined with clinical observations, patient history, and epidemiological information. The expected result is Negative. Fact Sheet for Patients: HairSlick.no Fact Sheet for Healthcare Providers: quierodirigir.com This test is not yet approved or cleared by the [place] FDA and  has been authorized for detection and/or diagnosis of SARS-CoV-2 by FDA under an Emergency Use Authorization (EUA). This EUA will remain  in effect (meaning this test can be used) for the duration of  the COVID-19 declaration under Section 56 4(b)(1) of the Act, 21 U.S.C. section 360bbb-3(b)(1), unless the authorization is terminated or revoked sooner. Performed at [facility], [address] [place], [place] [address]   Culture, Urine     Status: None   Collection Time: 06/15/19  6:08 AM   Specimen: Urine, Random  Result Value Ref Range Status   Specimen Description URINE, RANDOM  Final   Special Requests NONE  Final   Culture    Final    NO GROWTH Performed at [facility], [address], [place], [place] [address]    Report Status 06/16/2019 FINAL  Final     Labs: BNP (last 3 results) Recent Labs    06/14/19 1752  BNP 64.1   Basic Metabolic Panel: Recent Labs  Lab 06/16/19 0711 06/17/19 0354  NA 132* 135  K 3.6 3.2*  CL 102 102  CO2 20* 24  GLUCOSE 103* 83  BUN 13 10  CREATININE 0.88 0.79  CALCIUM 8.5* 8.7*  MG 1.5*  --    Liver Function Tests: Recent Labs  Lab 06/16/19 0711 06/17/19 0354  AST 23  --   ALT 15  --   ALKPHOS 66  --   BILITOT 1.8* 1.0  PROT 6.7  --   ALBUMIN 2.9*  --    No results for input(s): LIPASE, AMYLASE in the last 168 hours. No results for input(s): AMMONIA in the last 168 hours. CBC: Recent Labs  Lab 06/16/19 0711  WBC 8.1  NEUTROABS 6.9  HGB 12.2*  HCT 35.7*  MCV 110.5*  PLT 157   Cardiac Enzymes: No results for input(s): CKTOTAL, CKMB, CKMBINDEX, TROPONINI in the last 168 hours. BNP: Invalid input(s): POCBNP CBG: Recent Labs  Lab 06/20/19 0857 06/20/19 1518  GLUCAP 89 121*   D-Dimer No results for input(s): DDIMER in the last 72 hours. Hgb A1c No results for input(s): HGBA1C in the last 72 hours. Lipid Profile No results for input(s): CHOL, HDL, LDLCALC, TRIG, CHOLHDL, LDLDIRECT in the last 72 hours. Thyroid function studies No results for input(s): TSH, T4TOTAL, T3FREE, THYROIDAB in the last 72 hours.  Invalid input(s): FREET3 Anemia work up No results for input(s): VITAMINB12, FOLATE, FERRITIN, TIBC, IRON, RETICCTPCT in the last 72 hours. Urinalysis    Component Value Date/Time   COLORURINE RED (A) 06/14/2019 1929   APPEARANCEUR TURBID (A) 06/14/2019 1929   LABSPEC 1.027 06/14/2019 1929   PHURINE 5.0 06/14/2019 1929   GLUCOSEU NEGATIVE 06/14/2019 1929   HGBUR NEGATIVE 06/14/2019 1929   BILIRUBINUR SMALL (A) 06/14/2019 1929   KETONESUR NEGATIVE 06/14/2019 1929   PROTEINUR 30 (A) 06/14/2019 1929   NITRITE NEGATIVE  06/14/2019 1929   LEUKOCYTESUR NEGATIVE 06/14/2019 1929   Sepsis Labs Invalid input(s): PROCALCITONIN,  WBC,  LACTICIDVEN Microbiology Recent Results (from the past 240 hour(s))  Culture, blood (routine x 2)     Status: None   Collection Time: 06/15/19  1:15 AM   Specimen: BLOOD  Result Value Ref Range Status   Specimen Description BLOOD RIGHT HAND  Final   Special Requests   Final    BOTTLES DRAWN AEROBIC AND ANAEROBIC Blood Culture results may not be optimal due to an inadequate volume of blood received in culture bottles   Culture   Final    NO GROWTH 5 DAYS Performed at [facility], [address], [place], [place] [address]    Report Status 06/20/2019 FINAL  Final  Culture, blood (routine  x 2)     Status: None   Collection Time: 06/15/19  1:47 AM   Specimen: BLOOD  Result Value Ref Range Status   Specimen Description BLOOD LEFT ARM  Final   Special Requests   Final    BOTTLES DRAWN AEROBIC AND ANAEROBIC Blood Culture adequate volume   Culture   Final    NO GROWTH 5 DAYS Performed at [facility], [address] [place], [place] [address]    Report Status 06/20/2019 FINAL  Final  CSF culture     Status: None   Collection Time: 06/15/19  2:53 AM   Specimen: CSF; Cerebrospinal Fluid  Result Value Ref Range Status   Specimen Description CSF  Final   Special Requests NONE  Final   Gram Stain CYTOSPIN SMEAR NO WBC SEEN NO ORGANISMS SEEN   Final   Culture   Final    NO GROWTH 3 DAYS Performed at [facility], [address], [place], [place] [address]    Report Status 06/18/2019 FINAL  Final  SARS CORONAVIRUS 2 (TAT 6-24 HRS) Nasopharyngeal Nasopharyngeal Swab     Status: None   Collection Time: 06/15/19  5:24 AM   Specimen: Nasopharyngeal Swab  Result Value Ref Range Status   SARS Coronavirus 2 NEGATIVE NEGATIVE Final    Comment: (NOTE) SARS-CoV-2 target nucleic acids are NOT DETECTED. The SARS-CoV-2 RNA is generally detectable in upper and  lower respiratory specimens during the acute phase of infection. Negative results do not preclude SARS-CoV-2 infection, do not rule out co-infections with other pathogens, and should not be used as the sole basis for treatment or other patient management decisions. Negative results must be combined with clinical observations, patient history, and epidemiological information. The expected result is Negative. Fact Sheet for Patients: HairSlick.no Fact Sheet for Healthcare Providers: quierodirigir.com This test is not yet approved or cleared by the [place] FDA and  has been authorized for detection and/or diagnosis of SARS-CoV-2 by FDA under an Emergency Use Authorization (EUA). This EUA will remain  in effect (meaning this test can be used) for the duration of the COVID-19 declaration under Section 56 4(b)(1) of the Act, 21 U.S.C. section 360bbb-3(b)(1), unless the authorization is terminated or revoked sooner. Performed at [facility], [address], [place], [place] [address]   Culture, Urine     Status: None   Collection Time: 06/15/19  6:08 AM   Specimen: Urine, Random  Result Value Ref Range Status   Specimen Description URINE, RANDOM  Final   Special Requests NONE  Final   Culture   Final    NO GROWTH Performed at [facility], [address], [place], [place] [address]    Report Status 06/16/2019 FINAL  Final     Time coordinating discharge: Over 30 minutes  SIGNED:   Azucena Fallen, DO [facility] 06/22/2019, 2:50 PM Pager   If 7PM-7AM, please contact night-coverage [email or web address]

## 2019-06-22 NOTE — TOC Progression Note (Signed)
Transition of Care [facility]) - Progression Note    Patient Details  Name: Bubber Rothert MRN: [id number] Date of Birth: 02-16-1961  Transition of [facility]) CM/SW Contact  Bess Kinds, RN Phone Number: [phone] 06/22/2019, 12:25 PM  Clinical Narrative:     Spoke with patient at the bedside. Advised of hospital follow up scheduled at [facility]. Discussed recommendations for DME RW and 3N1. Patient declines 3N1, but accepts RW. Referral sent to [facility]. Spoke with liaison at [facility] - patient qualifies for charity, but patient will only get [facility] PT. Patient agreeable to using [facility] pharmacy for DC medications. TOC team following for transition needs.   Expected Discharge Plan: Home w Home Health Services Barriers to Discharge: Continued Medical Work up  Expected Discharge Plan and Services Expected Discharge Plan: Home w Home Health Services   Discharge Planning Services: Other - See comment(CSW and nuse case manager have spoken with patient)     Expected Discharge Date: 06/21/19                                     Social Determinants of Health (SDOH) Interventions    Readmission Risk Interventions No flowsheet data found.

## 2019-06-22 NOTE — Progress Notes (Signed)
Physical Therapy Treatment Patient Details Name: Bryce Mendoza MRN: [id number] DOB: 11-28-1960 Today's Date: 06/22/2019    History of Present Illness Pt is a 59 y/o male admitted secondary to worsening weakness and numbness in BUE and BLE. Pt reports being completely numb in trunk. Work up pending; possible GB vs MS. PMH includes DVT, PE, and empyema of lung.     PT Comments    Continuing work on functional mobility and activity tolerance;  We discussed changes in dc plans, he voiced frustration with insurance situation, but ultimately he seems ok with getting home; Session focused on gait and stairs -- he was able to ascend/descend a flight of stair slowly, but not needing lots of physical assist; Today Bryce Mendoza is concerned with his hand function -- encouraged him to look into Outpt OT/PT after his HHcourse and when his insurance is figured out   Follow Up Recommendations  Home health PT     Equipment Recommendations  Rolling walker with 5" wheels;3in1 (PT)    Recommendations for Other Services       Precautions / Restrictions Precautions Precautions: Fall    Mobility  Bed Mobility Overal bed mobility: Modified Independent Bed Mobility: Supine to Sit;Sit to Supine     Supine to sit: Modified independent (Device/Increase time) Sit to supine: Modified independent (Device/Increase time)      Transfers Overall transfer level: Needs assistance Equipment used: Rolling walker (2 wheeled) Transfers: Sit to/from Stand Sit to Stand: Supervision         General transfer comment: heavy dependence on UEs to push up  Ambulation/Gait Ambulation/Gait assistance: Min guard Gait Distance (Feet): 50 Feet(x2) Assistive device: Rolling walker (2 wheeled) Gait Pattern/deviations: Step-through pattern;Decreased stride length;Wide base of support     General Gait Details: Continued heavy reliance on UEs for stability during gait; cues for RW proximity and upright posture, as well as to  relax shoulders; stiff steps, decr knee flexion during swing phase;    Stairs Stairs: Yes Stairs assistance: Min guard(without physical contact) Stair Management: One rail Left;Step to pattern;Forwards(R hand on wall) Number of Stairs: 12 General stair comments: Royer showed me the way he manages his stairs at home; slow and labored, but able to manage a flight, and no gross loss of balance   Wheelchair Mobility    Modified Rankin (Stroke Patients Only)       Balance     Sitting balance-Leahy Scale: Good       Standing balance-Leahy Scale: Fair                              Cognition Arousal/Alertness: Awake/alert Behavior During Therapy: WFL for tasks assessed/performed Overall Cognitive Status: Within Functional Limits for tasks assessed                                        Exercises      General Comments        Pertinent Vitals/Pain Pain Assessment: Faces Faces Pain Scale: Hurts a little bit Pain Location: bil hands Pain Descriptors / Indicators: Aching Pain Intervention(s): Monitored during session    Home Living                      Prior Function            PT Goals (current goals can now be  found in the care plan section) Acute Rehab PT Goals Patient Stated Goal: to figure out what is going on PT Goal Formulation: With patient Time For Goal Achievement: 06/29/19 Potential to Achieve Goals: Good Progress towards PT goals: Progressing toward goals    Frequency    Min 3X/week      PT Plan Discharge plan needs to be updated    Co-evaluation              AM-PAC PT "6 Clicks" Mobility   Outcome Measure  Help needed turning from your back to your side while in a flat bed without using bedrails?: None Help needed moving from lying on your back to sitting on the side of a flat bed without using bedrails?: None Help needed moving to and from a bed to a chair (including a wheelchair)?: None Help  needed standing up from a chair using your arms (e.g., wheelchair or bedside chair)?: None Help needed to walk in hospital room?: A Little Help needed climbing 3-5 steps with a railing? : A Little 6 Click Score: 22    End of Session Equipment Utilized During Treatment: Gait belt Activity Tolerance: Patient tolerated treatment well Patient left: in chair;with call bell/phone within reach;with chair alarm set Nurse Communication: Mobility status PT Visit Diagnosis: Muscle weakness (generalized) (M62.81);Unsteadiness on feet (R26.81);History of falling (Z91.81)     Time: 6433-2951 PT Time Calculation (min) (ACUTE ONLY): 26 min  Charges:  $Gait Training: 23-37 mins                     Van Clines, Shafer  Acute Rehabilitation Services Pager [phone] Office [phone]    Levi Aland 06/22/2019, 2:07 PM

## 2019-06-22 NOTE — TOC Transition Note (Addendum)
Transition of Care [facility]) - CM/SW Discharge Note   Patient Details  Name: Bryce Mendoza MRN: [id number] Date of Birth: 1961/02/08  Transition of Care [facility]) CM/SW Contact:  Bess Kinds, RN Phone Number: [phone] 06/22/2019, 3:45 PM   Clinical Narrative:     Patient to transition home today. Medications filled by [facility] pharmacy and in the process of being delivered to bedside. RW delivered to bedside. Discussed transportation. Waiver signed and faxed to transportation for [facility] transportation, however, patient thinks he has someone who can pick him up. No further TOC needs identfied at this time.   Notified by [facility] of approval for charity [facility] PT. [facility] to f/u with patient after discharge.  Final next level of care: Home w Home Health Services Barriers to Discharge: No Barriers Identified   Patient Goals and CMS Choice Patient states their goals for this hospitalization and ongoing recovery are:: Patient anticipates going home at discharge CMS Medicare.gov Compare Post Acute Care list provided to:: Other (Comment Required)(SNF list not needed. Home health will be charity, as patient has no insurance at this time) Choice offered to / list presented to : NA  Discharge Placement                       Discharge Plan and Services   Discharge Planning Services: Other - See comment(CSW and nuse case manager have spoken with patient)                                 Social Determinants of Health (SDOH) Interventions     Readmission Risk Interventions No flowsheet data found.

## 2019-06-22 NOTE — Progress Notes (Signed)
[name] to be discharged Home per MD order. Discussed prescriptions and follow up appointments with the patient. Prescriptions given to patient; medication list explained in detail. Patient verbalized understanding.  Skin clean, dry and intact without evidence of skin break down, no evidence of skin tears noted. IV catheter discontinued intact. Site without signs and symptoms of complications. Dressing and pressure applied. Pt denies pain at the site currently. No complaints noted.  Patient free of lines, drains, and wounds.   An After Visit Summary (AVS) was printed and given to the patient. Patient escorted via wheelchair, and discharged home via private auto.  Shanna Cisco, RN

## 2019-06-23 LAB — VITAMIN B6: Vitamin B6: 7.5 ug/L (ref 5.3–46.7)

## 2019-06-25 LAB — VITAMIN B1: Vitamin B1 (Thiamine): 77.8 nmol/L (ref 66.5–200.0)

## 2019-07-18 IMAGING — CT CT CHEST W/ CM
2 of 3 series · 15 of 36 positions shown, 18 images · IV contrast (iopamidol)
Comparison: Chest radiographs earlier today

CLINICAL DATA: Dyspnea with left-sided chest pain on 11 days ago.
Thoracentesis earlier today.

EXAM:
CT CHEST WITH CONTRAST
TECHNIQUE: Multidetector CT imaging of the chest was performed during
intravenous contrast administration.
CONTRAST:  75mL 6NXLX8-UII IOPAMIDOL (6NXLX8-UII) INJECTION 61%

[Series 2: axial st · axial · 0.85mm/px · z∈[+1496,+1798]mm · 12 of 179 slices shown, 15 images]
[im 14/179  mediastinal]
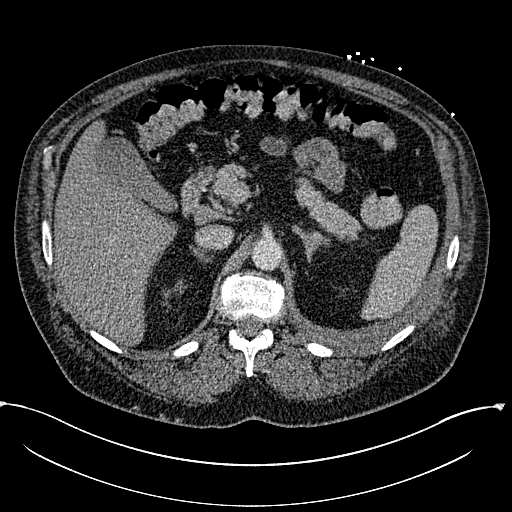
[im 14/179  lung]
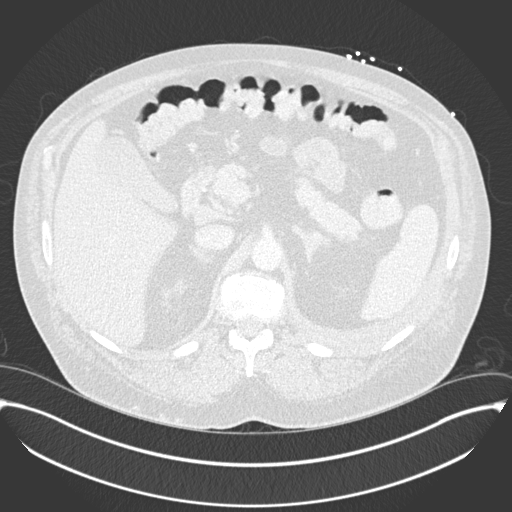
[im 27/179  lung]
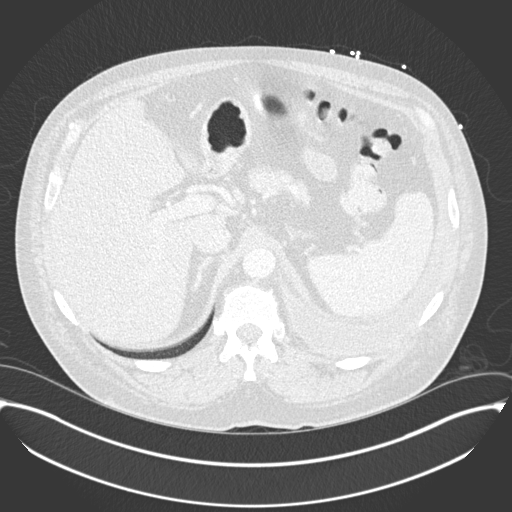
[im 40/179  lung]
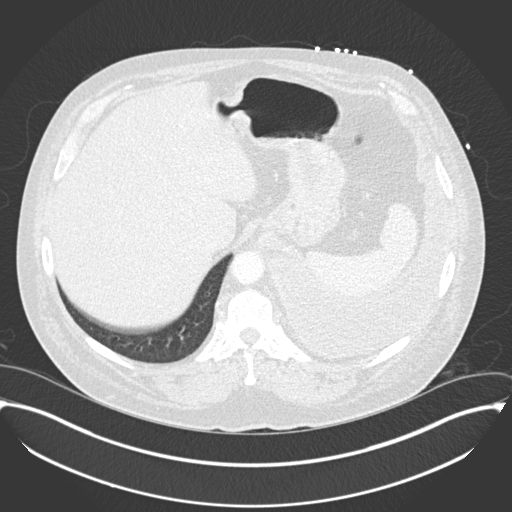
[im 53/179  lung]
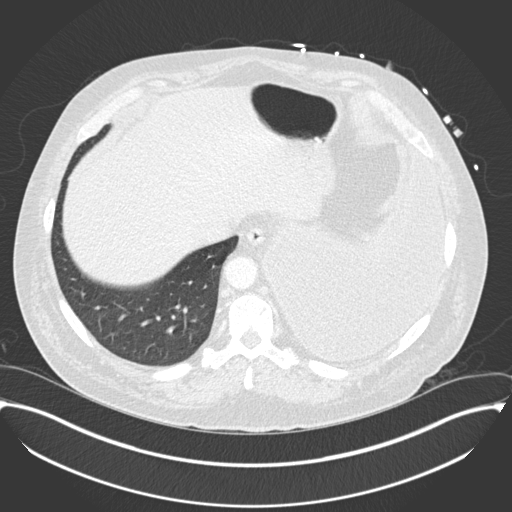
[im 66/179  mediastinal]
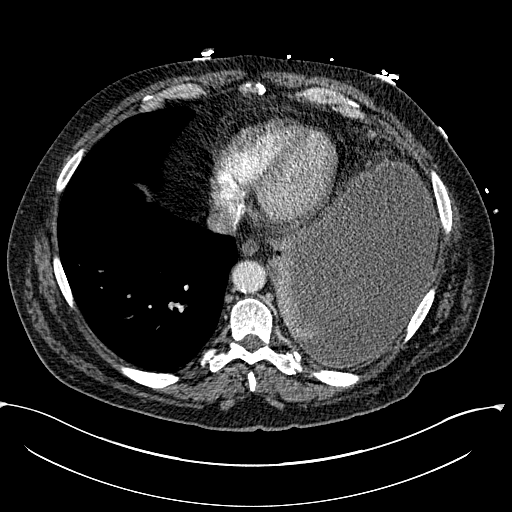
[im 66/179  lung]
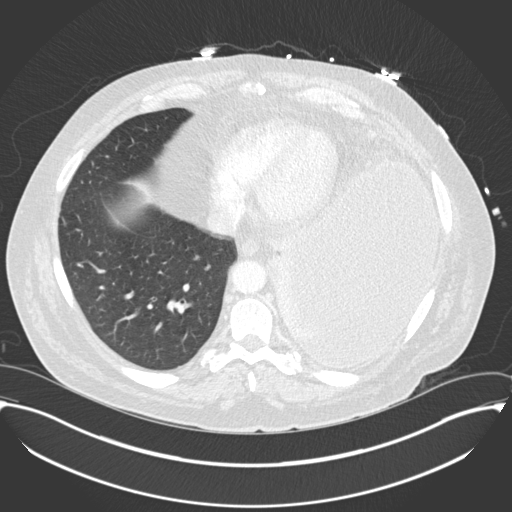
[im 80/179  lung]
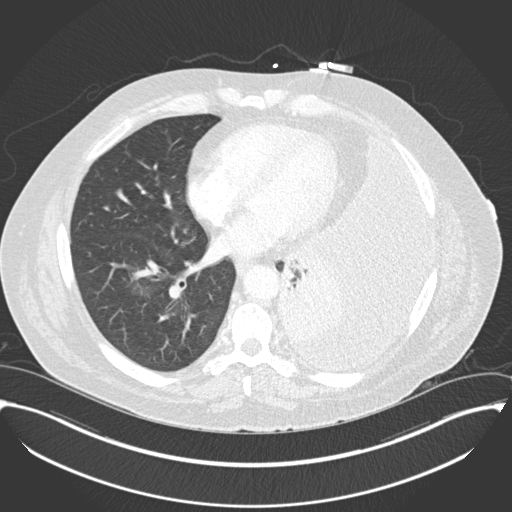
[im 99/179  lung]
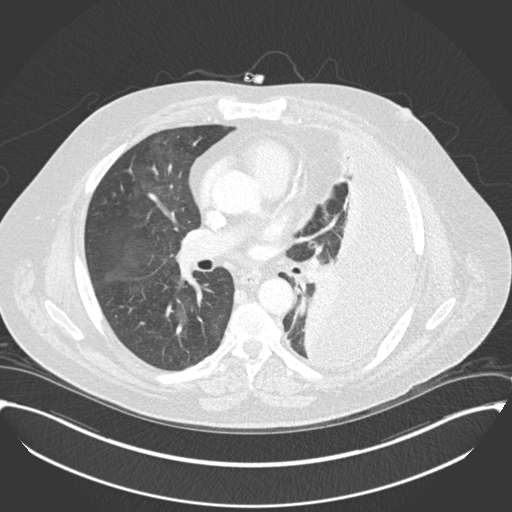
[im 113/179  lung]
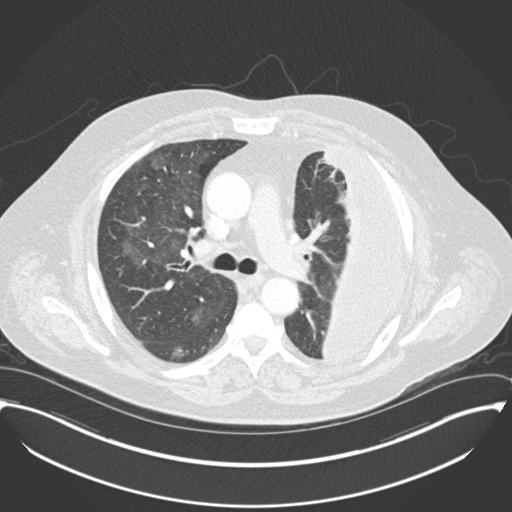
[im 126/179  mediastinal]
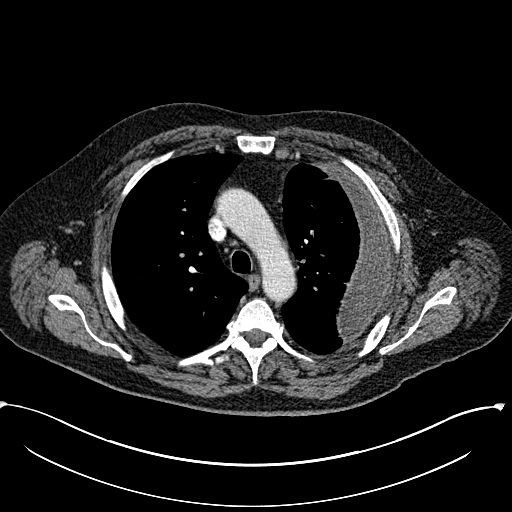
[im 126/179  lung]
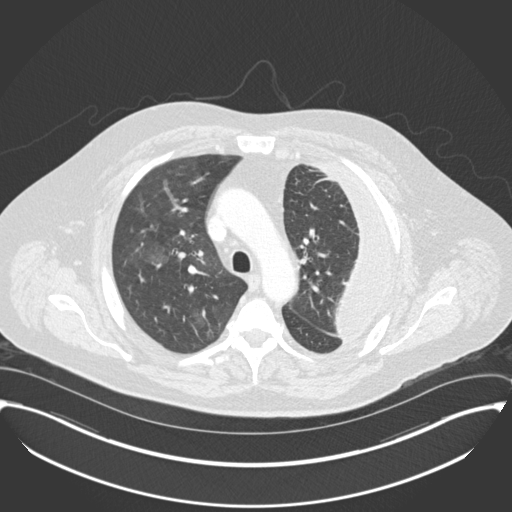
[im 139/179  lung]
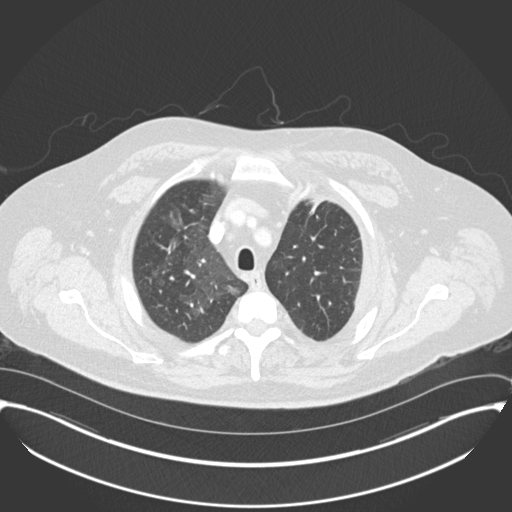
[im 152/179  lung]
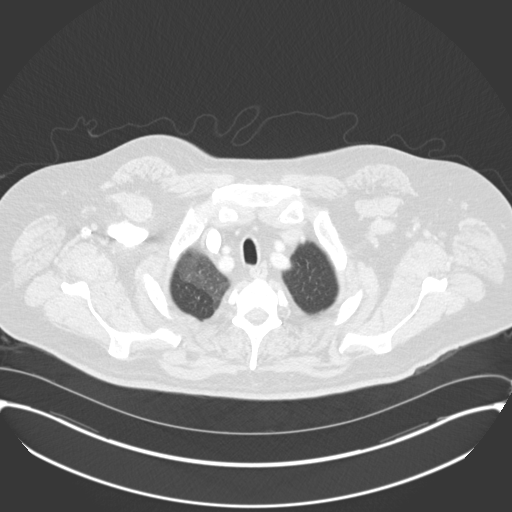
[im 165/179  lung]
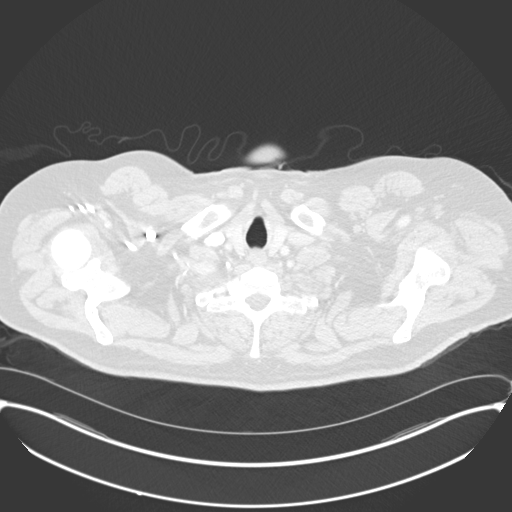

[Series 6: coronal · coronal · 0.73mm/px · 3 of 167 slices shown]
[im 34/167  lung]
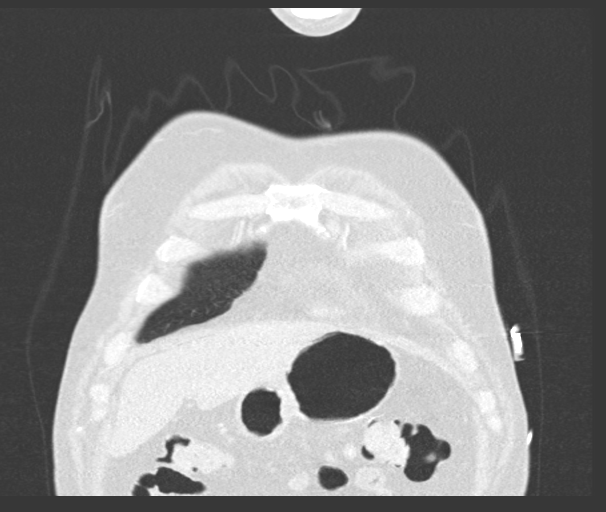
[im 67/167  lung]
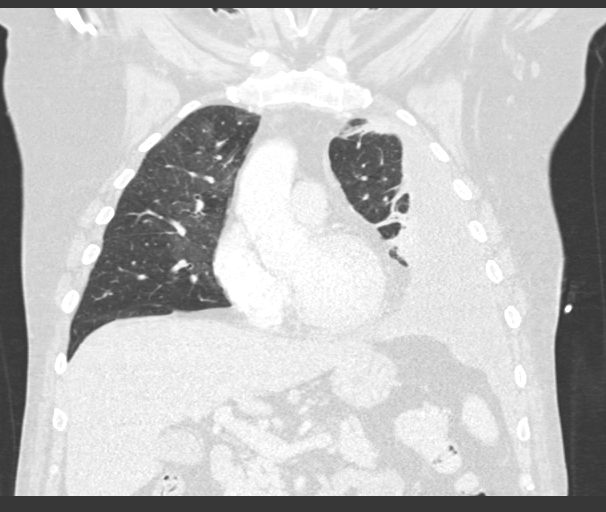
[im 100/167  lung]
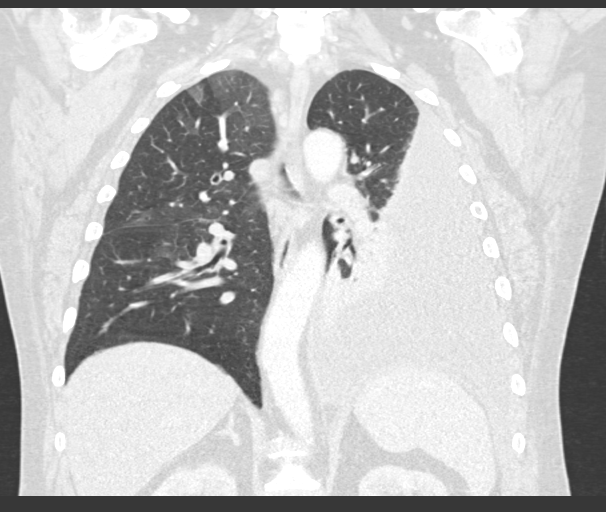

[15 of 36 positions shown; findings below may reference images not displayed]

FINDINGS: Cardiovascular: Suboptimal contrast bolus. No acute vascular
findings are seen. There is mild coronary artery atherosclerosis.
The heart size is normal. There is no pericardial effusion.

Mediastinum/Nodes: There are no enlarged mediastinal, hilar or
axillary lymph nodes.There are scattered small mediastinal lymph
nodes. There are also prominent left internal mammary lymph nodes,
measuring to 10 mm short axis on image 55/2. The thyroid gland,
trachea and esophagus demonstrate no significant findings.

Lungs/Pleura: There is a moderate size residual left pleural
effusion which is loculated laterally and with associated pleural
thickening. No pleural based mass identified on noncontrast imaging.
There is no pneumothorax or right-sided pleural effusion. There is
compressive left lung atelectasis, especially in the lower lobe. The
right lung is well aerated with patchy ground-glass opacities,
especially in the upper lobe. No pulmonary nodule or endobronchial
lesion seen.

Upper abdomen: Borderline hepatic steatosis without apparent focal
hepatic abnormality. The adrenal glands appear normal. Portacaval
node measuring 17 mm short axis on image 165/2.

Musculoskeletal/Chest wall: There is no chest wall mass or
suspicious osseous finding.
IMPRESSION: 1. Moderate size residual loculated left pleural effusion is
suboptimally characterized without contrast, although is associated
with some pleural thickening, suggesting a possible exudate.
Correlate with thoracentesis results.
2. Resulting compressive left lung atelectasis, especially in the
lower lobe. No endobronchial lesion or suspicious nodule within the
aerated lungs.
3. Patchy right lung ground-glass opacities (mosaic attenuation),
likely related to small airways disease or edema. No evidence of
pulmonary embolism on nondedicated imaging.
4. Enlarged left internal mammary and portacaval lymph nodes are
nonspecific and possibly reactive. No mediastinal adenopathy.

## 2019-07-22 ENCOUNTER — Encounter: Payer: Self-pay | Admitting: Nurse Practitioner

## 2019-07-22 ENCOUNTER — Ambulatory Visit: Payer: Self-pay | Attending: Nurse Practitioner | Admitting: Nurse Practitioner

## 2019-07-22 ENCOUNTER — Other Ambulatory Visit: Payer: Self-pay

## 2019-07-22 DIAGNOSIS — M720 Palmar fascial fibromatosis [Dupuytren]: Secondary | ICD-10-CM

## 2019-07-22 DIAGNOSIS — M7989 Other specified soft tissue disorders: Secondary | ICD-10-CM

## 2019-07-22 DIAGNOSIS — G629 Polyneuropathy, unspecified: Secondary | ICD-10-CM

## 2019-07-22 DIAGNOSIS — Z7689 Persons encountering health services in other specified circumstances: Secondary | ICD-10-CM

## 2019-07-22 DIAGNOSIS — G610 Guillain-Barre syndrome: Secondary | ICD-10-CM

## 2019-07-22 MED ORDER — CYCLOBENZAPRINE HCL 5 MG PO TABS: 5.0000 mg | ORAL_TABLET | Freq: Three times a day (TID) | ORAL | 1 refills | Status: DC | PRN

## 2019-07-22 MED ORDER — PREGABALIN 75 MG PO CAPS: 75.0000 mg | ORAL_CAPSULE | Freq: Two times a day (BID) | ORAL | 1 refills | Status: DC

## 2019-07-22 NOTE — Progress Notes (Signed)
Pain in hands and feet for 1 week. Gabapentin not working.
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
## 2019-07-23 ENCOUNTER — Encounter: Payer: Self-pay | Admitting: Nurse Practitioner

## 2019-07-25 ENCOUNTER — Other Ambulatory Visit: Payer: Self-pay | Admitting: Nurse Practitioner

## 2019-07-25 ENCOUNTER — Ambulatory Visit (HOSPITAL_COMMUNITY)
Admission: RE | Admit: 2019-07-25 | Discharge: 2019-07-25 | Disposition: A | Discharge status: Home / Self Care | Payer: Medicaid Other | Source: Ambulatory Visit | Attending: Nurse Practitioner | Admitting: Nurse Practitioner

## 2019-07-25 ENCOUNTER — Other Ambulatory Visit: Payer: Self-pay

## 2019-07-25 DIAGNOSIS — M7989 Other specified soft tissue disorders: Secondary | ICD-10-CM | POA: Diagnosis not present

## 2019-07-25 MED ORDER — APIXABAN (ELIQUIS) VTE STARTER PACK (10MG AND 5MG): ORAL_TABLET | ORAL | 0 refills | Status: DC

## 2019-07-25 MED FILL — !ELIQUIS 5 MG TABLET: 5 | 30 days supply | Qty: 74 | Fill #0

## 2019-07-25 NOTE — Progress Notes (Signed)
Called patient regarding LLE DVT. He will need to restart eliquis as soon as possible. Instructed patient that eliquis will likely be long term and indefinite. He verbalized understanding.

## 2019-07-25 NOTE — Progress Notes (Addendum)
Lower venous duplex       has been completed. Preliminary results can be found under CV proc through chart review. Jeb Levering, BS, RDMS, RVT     Attempted to call report of Positive findings to [facility] health and wellness with no answer on main line. No other number was given for results.   Contacted [name] through secure chat and she is aware of results and will call patient.

## 2019-08-17 ENCOUNTER — Encounter (HOSPITAL_COMMUNITY): Payer: Self-pay | Admitting: Emergency Medicine

## 2019-08-17 ENCOUNTER — Ambulatory Visit (HOSPITAL_COMMUNITY)
Admission: EM | Admit: 2019-08-17 | Discharge: 2019-08-17 | Disposition: A | Discharge status: Home / Self Care | Payer: Self-pay

## 2019-08-17 ENCOUNTER — Emergency Department (HOSPITAL_COMMUNITY)
Admission: EM | Admit: 2019-08-17 | Discharge: 2019-08-18 | Disposition: A | Discharge status: Home / Self Care | Payer: Medicaid Other | Attending: Emergency Medicine | Admitting: Emergency Medicine

## 2019-08-17 ENCOUNTER — Other Ambulatory Visit: Payer: Self-pay

## 2019-08-17 ENCOUNTER — Encounter (HOSPITAL_COMMUNITY): Payer: Self-pay

## 2019-08-17 ENCOUNTER — Emergency Department (HOSPITAL_BASED_OUTPATIENT_CLINIC_OR_DEPARTMENT_OTHER): Payer: Medicaid Other

## 2019-08-17 DIAGNOSIS — M79661 Pain in right lower leg: Secondary | ICD-10-CM | POA: Diagnosis present

## 2019-08-17 DIAGNOSIS — R531 Weakness: Secondary | ICD-10-CM

## 2019-08-17 DIAGNOSIS — Z720 Tobacco use: Secondary | ICD-10-CM | POA: Diagnosis not present

## 2019-08-17 DIAGNOSIS — M79609 Pain in unspecified limb: Secondary | ICD-10-CM

## 2019-08-17 DIAGNOSIS — M7989 Other specified soft tissue disorders: Secondary | ICD-10-CM

## 2019-08-17 DIAGNOSIS — G629 Polyneuropathy, unspecified: Secondary | ICD-10-CM | POA: Diagnosis not present

## 2019-08-17 DIAGNOSIS — Z86718 Personal history of other venous thrombosis and embolism: Secondary | ICD-10-CM | POA: Diagnosis not present

## 2019-08-17 DIAGNOSIS — Z86711 Personal history of pulmonary embolism: Secondary | ICD-10-CM | POA: Diagnosis not present

## 2019-08-17 DIAGNOSIS — G610 Guillain-Barre syndrome: Secondary | ICD-10-CM

## 2019-08-17 DIAGNOSIS — M720 Palmar fascial fibromatosis [Dupuytren]: Secondary | ICD-10-CM

## 2019-08-17 LAB — CBC
HCT: 49.5 % (ref 39.0–52.0)
Hemoglobin: 16.5 g/dL (ref 13.0–17.0)
MCH: 34 pg (ref 26.0–34.0)
MCHC: 33.3 g/dL (ref 30.0–36.0)
MCV: 102.1 fL — ABNORMAL HIGH (ref 80.0–100.0)
Platelets: 147 10*3/uL — ABNORMAL LOW (ref 150–400)
RBC: 4.85 MIL/uL (ref 4.22–5.81)
RDW: 14.4 % (ref 11.5–15.5)
WBC: 9 10*3/uL (ref 4.0–10.5)
nRBC: 0 % (ref 0.0–0.2)

## 2019-08-17 LAB — COMPREHENSIVE METABOLIC PANEL
ALT: 39 U/L (ref 0–44)
AST: 56 U/L — ABNORMAL HIGH (ref 15–41)
Albumin: 3.1 g/dL — ABNORMAL LOW (ref 3.5–5.0)
Alkaline Phosphatase: 120 U/L (ref 38–126)
Anion gap: 11 (ref 5–15)
BUN: 6 mg/dL (ref 6–20)
CO2: 21 mmol/L — ABNORMAL LOW (ref 22–32)
Calcium: 9 mg/dL (ref 8.9–10.3)
Chloride: 106 mmol/L (ref 98–111)
Creatinine, Ser: 0.75 mg/dL (ref 0.61–1.24)
GFR calc Af Amer: >60 mL/min (ref >60)
GFR calc non Af Amer: >60 mL/min (ref >60)
Glucose, Bld: 127 mg/dL — ABNORMAL HIGH (ref 70–99)
Potassium: 4.1 mmol/L (ref 3.5–5.1)
Sodium: 138 mmol/L (ref 135–145)
Total Bilirubin: 1 mg/dL (ref 0.3–1.2)
Total Protein: 7.5 g/dL (ref 6.5–8.1)

## 2019-08-17 LAB — FOLATE: Folate: 13.4 ng/mL (ref >5.9)

## 2019-08-17 MED ORDER — ACETAMINOPHEN 500 MG PO TABS: 1000.0000 mg | ORAL_TABLET | Freq: Once | ORAL | Status: DC

## 2019-08-17 MED ORDER — CYCLOBENZAPRINE HCL 10 MG PO TABS
5.0000 mg | ORAL_TABLET | Freq: Once | ORAL | Status: AC
  Administered 2019-08-17: 5 mg via ORAL
  Filled 2019-08-17: qty 1

## 2019-08-17 MED ORDER — TRAMADOL HCL 50 MG PO TABS
50.0000 mg | ORAL_TABLET | Freq: Once | ORAL | Status: AC
  Administered 2019-08-17: 50 mg via ORAL
  Filled 2019-08-17: qty 1

## 2019-08-17 NOTE — Discharge Instructions (Addendum)
Known left DVT with persistent swelling despite on eliquis, new right leg swelling and pain Worsening ascending weakness/paresthesias since admission in April

## 2019-08-17 NOTE — ED Triage Notes (Signed)
Pt. Stated, I was sent down here from UC cause I probably have a DVT on the right lower leg and foot

## 2019-08-17 NOTE — ED Notes (Signed)
Pt ambulated in hall with walker, c/o pain once back in bed. RN notified.

## 2019-08-17 NOTE — Discharge Instructions (Addendum)
You were evaluated in the Emergency Department and after careful evaluation, we did not find any emergent condition requiring admission or further testing in the hospital.  Your exam/testing today is overall reassuring.  We recommend that you follow-up with the neurologist for continued care.  Please return to the Emergency Department if you experience any worsening of your condition.  Thank you for allowing [place] to be a part of your care.

## 2019-08-17 NOTE — Progress Notes (Signed)
Lower extremity venous RT study completed.   See Cv Proc for preliminary results.   Bryce Mendoza  

## 2019-08-17 NOTE — ED Triage Notes (Signed)
Pt states he was recently discharged from the hospital approx 1 month ago and was tx for DVT of LLE and was placed on Eliquis for same.  Pt here today for increased pain to feet/hands and states he doesn't have any medication to help adequately with the pain. Also c/o swelling right foot which started approx 3-4 days ago.  +2 edema to right foot; +3 edema left foot. Feet warm to touch/erythemic.  Denies CP, SOB, dizziness, fever.  Reports chills a couple days ago. Pt still wearing "DNR" armband from hospital stay.  Pt states that "flexeril and tramadol were helping but out of them now".
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
## 2019-08-17 NOTE — ED Notes (Signed)
Patient is being discharged from the Urgent Care Center and sent to the Emergency Department via wheelchair by staff. Per Patterson Hammersmith, PA, patient is stable but in need of higher level of care due to leg swelling, weakness. Patient is aware and verbalizes understanding of plan of care.  Vitals:   08/17/19 1201  BP: 114/82  Pulse: 97  Resp: 20  Temp: 98 F (36.7 C)  SpO2: 96%
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
## 2019-08-17 NOTE — ED Notes (Signed)
Left leg diagnosed with a DVT and Ive been on Elaquis for 21/2 weeks.
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
## 2019-08-18 MED ORDER — TRAMADOL HCL 50 MG PO TABS: 50.0000 mg | ORAL_TABLET | Freq: Four times a day (QID) | ORAL | 0 refills | Status: DC | PRN

## 2019-08-18 MED ORDER — CYCLOBENZAPRINE HCL 10 MG PO TABS
5.0000 mg | ORAL_TABLET | Freq: Once | ORAL | Status: AC
  Administered 2019-08-18: 5 mg via ORAL
  Filled 2019-08-18: qty 1

## 2019-08-18 MED ORDER — CYCLOBENZAPRINE HCL 5 MG PO TABS: 5.0000 mg | ORAL_TABLET | Freq: Two times a day (BID) | ORAL | 1 refills | Status: DC | PRN

## 2019-08-18 MED ORDER — TRAMADOL HCL 50 MG PO TABS
50.0000 mg | ORAL_TABLET | Freq: Once | ORAL | Status: AC
  Administered 2019-08-18: 50 mg via ORAL
  Filled 2019-08-18: qty 1

## 2019-08-18 NOTE — ED Provider Notes (Signed)
I assumed care of this patient.  Please see previous provider note for further details of Hx, PE.  Briefly patient is a 59 y.o. male who presented for leg pain and neuropathy. Ruled out for DVT. Pending neurology consultation and recommendation.  Neuro saw patient and left recommendations.  The patient appears reasonably screened and/or stabilized for discharge and I doubt any other medical condition or other [facility] requiring further screening, evaluation, or treatment in the ED at this time prior to discharge. Safe for discharge with strict return precautions.  Disposition: Discharge  Condition: Good  I have discussed the results, Dx and Tx plan with the patient/family who expressed understanding and agree(s) with the plan. Discharge instructions discussed at length. The patient/family was given strict return precautions who verbalized understanding of the instructions. No further questions at time of discharge.    ED Discharge Orders         Ordered    cyclobenzaprine (FLEXERIL) 5 MG tablet  2 times daily PRN     Discontinue  Reprint     08/18/19 0046    traMADol (ULTRAM) 50 MG tablet  Every 6 hours PRN     Discontinue  Reprint     08/18/19 0046    Ambulatory referral to Neurology     Discontinue  Reprint    Comments: An appointment is requested in approximately: 2 weeks   08/17/19 2333            Follow Up: [facility] [address]     [address] [place] [address] [phone] Call  If you have not been called about your appointment within 1 week.          Nira Conn, MD 08/18/19 [phone]

## 2019-08-25 ENCOUNTER — Other Ambulatory Visit: Payer: Self-pay | Admitting: Nurse Practitioner

## 2019-08-25 DIAGNOSIS — M720 Palmar fascial fibromatosis [Dupuytren]: Secondary | ICD-10-CM

## 2019-08-25 DIAGNOSIS — G610 Guillain-Barre syndrome: Secondary | ICD-10-CM

## 2019-10-13 ENCOUNTER — Encounter: Payer: Self-pay | Admitting: Neurology

## 2019-10-13 ENCOUNTER — Ambulatory Visit: Payer: MEDICAID | Admitting: Neurology

## 2019-10-13 VITALS — BP 136/74 | HR 94 | Ht 73.0 in | Wt 283.0 lb

## 2019-10-13 DIAGNOSIS — G6181 Chronic inflammatory demyelinating polyneuritis: Secondary | ICD-10-CM

## 2019-10-13 DIAGNOSIS — G610 Guillain-Barre syndrome: Secondary | ICD-10-CM

## 2019-10-13 DIAGNOSIS — M720 Palmar fascial fibromatosis [Dupuytren]: Secondary | ICD-10-CM

## 2019-10-13 MED ORDER — DULOXETINE HCL 60 MG PO CPEP: 60.0000 mg | ORAL_CAPSULE | Freq: Every day | ORAL | 11 refills | Status: DC

## 2019-10-13 MED ORDER — GABAPENTIN 300 MG PO CAPS
300.0000 mg | ORAL_CAPSULE | Freq: Three times a day (TID) | ORAL | 11 refills | Status: DC
Start: 2019-10-13 — End: 2020-05-09

## 2019-10-13 MED ORDER — PREDNISONE 10 MG PO TABS: ORAL_TABLET | ORAL | 3 refills | Status: DC

## 2019-10-13 MED ORDER — APIXABAN 5 MG PO TABS: 5.0000 mg | ORAL_TABLET | Freq: Two times a day (BID) | ORAL | 4 refills | Status: DC

## 2019-10-13 MED ORDER — CYCLOBENZAPRINE HCL 10 MG PO TABS: 10.0000 mg | ORAL_TABLET | Freq: Three times a day (TID) | ORAL | 5 refills | Status: DC
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
## 2019-10-14 LAB — HEMOGLOBIN A1C
Est. average glucose Bld gHb Est-mCnc: 105 mg/dL
Hgb A1c MFr Bld: 5.3 % (ref 4.8–5.6)

## 2019-10-14 LAB — CK: Total CK: 47 U/L (ref 41–331)

## 2019-10-17 ENCOUNTER — Telehealth: Payer: Self-pay | Admitting: *Deleted

## 2019-10-17 NOTE — Telephone Encounter (Signed)
-----   Message from Anson Fret, MD sent at 10/17/2019  1:12 PM EDT ----- Hgba1c and ck normal thanks

## 2019-10-17 NOTE — Telephone Encounter (Signed)
Left message letting him know the lab work was normal. Provided our number to call back with any questions.

## 2019-11-10 ENCOUNTER — Other Ambulatory Visit: Payer: Self-pay | Admitting: Neurology

## 2019-11-16 ENCOUNTER — Telehealth: Payer: Self-pay | Admitting: Neurology

## 2019-11-16 MED ORDER — FAMOTIDINE 20 MG PO TABS
20.0000 mg | ORAL_TABLET | Freq: Every day | ORAL | 5 refills | Status: DC
Start: 2019-11-16 — End: 2020-07-11

## 2019-11-16 MED ORDER — ONDANSETRON 4 MG PO TBDP: 4.0000 mg | ORAL_TABLET | Freq: Three times a day (TID) | ORAL | 6 refills | Status: DC | PRN

## 2019-11-16 NOTE — Telephone Encounter (Signed)
I failed to reach patient, left a message for him to call back,  Chart reviewed, prednisone tapering dose started since October 13, 2019, more than a month ago, starting from 6 tablets, 10 mg decrement every 2 weeks, for the treatment of probable Guillain-Barr syndrome,  In theory, GI symptoms should be most obvious at the beginning of treatment, if is related to the prednisone,  1.He should take it after breakfast every morning 2.  I have called in famotidine 20 mg every day as needed for acid reducer 3.  Zofran as needed for nausea 4.  If above methods fail to improve his nausea, he may consider contact his primary care evaluation for evaluation of other GI problems

## 2019-11-16 NOTE — Telephone Encounter (Signed)
Pt has called back in response to the missed call from Dr Terrace Arabia.  Pt is asking for a call back

## 2019-11-16 NOTE — Addendum Note (Signed)
Addended by: Levert Feinstein on: 11/16/2019 03:05 PM   Modules accepted: Orders

## 2019-11-16 NOTE — Telephone Encounter (Signed)
Pt has called to report that he is very nauseated on predniSONE (DELTASONE) 10 MG tablet.  Pt is asking for a call to discuss.

## 2019-11-16 NOTE — Telephone Encounter (Addendum)
Pt called into report the prednisone taper is causing extreme nausea. Pt is currently on week three of his tapering process ( he recently started the 5 tabs for 2 weeks).  Wanted to know if MD had any recommendations to help with his nausea?

## 2019-11-16 NOTE — Telephone Encounter (Signed)
He started on prednisone 60mg  on August 23, now on 50mg  daily. since prednisone, he complains of upset stomach, different from his previous lack of appetite, which has been persistent, he did not notice significant improvement with current dose of prednisone  I have advised take prednisone in the morning after breakfast, calling famotidine 20 mg daily, Zofran as needed,  Keep EMG nerve conduction study appointment November 30, 2019

## 2019-11-30 ENCOUNTER — Ambulatory Visit: Payer: Medicaid Other | Admitting: Neurology

## 2019-11-30 ENCOUNTER — Other Ambulatory Visit: Payer: Self-pay

## 2019-11-30 ENCOUNTER — Ambulatory Visit (INDEPENDENT_AMBULATORY_CARE_PROVIDER_SITE_OTHER): Payer: Medicaid Other | Admitting: Neurology

## 2019-11-30 DIAGNOSIS — G6181 Chronic inflammatory demyelinating polyneuritis: Secondary | ICD-10-CM | POA: Diagnosis not present

## 2019-11-30 DIAGNOSIS — G610 Guillain-Barre syndrome: Secondary | ICD-10-CM

## 2019-11-30 NOTE — Patient Instructions (Signed)
Starting from December 02, 2019,  Prednisone 10 mg tablets,   3 tablets every morning for 1 week Then 2 tablets every morning for 1 week Then 1 tablet every morning for 1 week, Stop afterwards
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
## 2019-11-30 NOTE — Procedures (Signed)
Full Name: Alto Gandolfo Gender: Male MRN #: [id number] Date of Birth: [birth year]    Visit Date: 11/30/2019 07:26 Age: 59 Years Examining Physician: Levert Feinstein, MD  Referring Physician: Levert Feinstein, MD History: 59 year old male presented with subacute onset bilateral upper and lower extremity weakness, gait abnormality, since May 2021,  Summary of the test: Nerve conduction studies Right sural, superficial peroneal, and median sensory responses were were absent. Right ulnar and radial sensory responses showed mildly decreased snap amplitude. Right tibial motor response was normal.  Right peroneal to EDB motor response showed mildly decreased CMAP amplitude. Right median, ulnar motor responses were normal.  Electromyography: Selected needle examination of right upper and lower extremity muscles, right cervical, lumbar sacral paraspinal muscles were performed.  The only abnormality is mildly decreased recruitment pattern at the right abductor hallucis.  Conclusion: This is an abnormal study.  There is electrodiagnostic evidence of mild length dependent sensorimotor polyneuropathy, there was no evidence of right cervical, or right lumbosacral radiculopathy.  There is no evidence of intrinsic muscle disease.    ------------------------------- Levert Feinstein, M.D. PhD  [facility] [address] [place], [place] [address] Tel: [phone] Fax: [phone]  Verbal informed consent was obtained from the patient, patient was informed of potential risk of procedure, including bruising, bleeding, hematoma formation, infection, muscle weakness, muscle pain, numbness, among others.        MNC    Nerve / Sites Muscle Latency Ref. Amplitude Ref. Rel Amp Segments Distance Velocity Ref. Area    ms ms mV mV %  cm m/s m/s mVms  R Median - APB     Wrist APB 4.3 ?4.4 6.0 ?4.0 100 Wrist - APB 7   18.7     Upper arm APB 9.0  5.4  90.6 Upper arm - Wrist 23 49 ?49  16.9  R Ulnar - ADM     Wrist ADM 3.2 ?3.3 10.3 ?6.0 100 Wrist - ADM 7   32.5     B.Elbow ADM 7.7  7.4  72 B.Elbow - Wrist 22 49 ?49 26.7     A.Elbow ADM 9.8  6.7  90.2 A.Elbow - B.Elbow 10 49 ?49 23.4  R Peroneal - EDB     Ankle EDB 5.7 ?6.5 1.4 ?2.0 100 Ankle - EDB 9   5.9     Fib head EDB 11.6  1.2  86.7 Fib head - Ankle 26 44 ?44 5.0     Pop fossa EDB 13.9  1.1  94.4 Pop fossa - Fib head 10 44 ?44 4.6         Pop fossa - Ankle      R Tibial - AH     Ankle AH 3.8 ?5.8 6.0 ?4.0 100 Ankle - AH 9   11.6     Pop fossa AH 14.1  4.7  78 Pop fossa - Ankle 42 41 ?41 15.2             SNC    Nerve / Sites Rec. Site Peak Lat Ref.  Amp Ref. Segments Distance    ms ms V V  cm  R Radial - Anatomical snuff box (Forearm)     Forearm Wrist 2.5 ?2.9 8 ?15 Forearm - Wrist 10  R Sural - Ankle (Calf)     Calf Ankle NR ?4.4 NR ?6 Calf - Ankle 14  R Superficial peroneal - Ankle     Lat leg Ankle NR ?4.4 NR ?  6 Lat leg - Ankle 14  R Median - Orthodromic (Dig II, Mid palm)     Dig II Wrist NR ?3.4 NR ?10 Dig II - Wrist 13  R Ulnar - Orthodromic, (Dig V, Mid palm)     Dig V Wrist 2.6 ?3.1 3 ?5 Dig V - Wrist 98               F  Wave    Nerve F Lat Ref.   ms ms  R Tibial - AH 59.8 ?56.0  R Ulnar - ADM 31.8 ?32.0         EMG Summary Table    Spontaneous MUAP Recruitment  Muscle IA Fib PSW Fasc Other Amp Dur. Poly Pattern  R. Tibialis anterior Normal None None None _______ Normal Normal Normal Normal  R. Tibialis posterior Normal None None None _______ Normal Normal Normal Normal  R. Peroneus longus Normal None None None _______ Normal Normal Normal Normal  R. Gastrocnemius (Medial head) Normal None None None _______ Normal Normal Normal Normal  R. Vastus lateralis Normal None None None _______ Normal Normal Normal Normal  R. Lumbar paraspinals (low) Normal None None None _______ Normal Normal Normal Normal  R. Lumbar paraspinals (mid) Normal None None None _______ Normal Normal Normal Normal  R.  First dorsal interosseous Normal None None None _______ Normal Normal Normal Normal  R. Pronator teres Normal None None None _______ Normal Normal Normal Normal  R. Biceps brachii Normal None None None _______ Normal Normal Normal Normal  R. Deltoid Normal None None None _______ Normal Normal Normal Normal  R. Triceps brachii Normal None None None _______ Normal Normal Normal Normal  R. Cervical paraspinals Normal None None None _______ Normal Normal Normal Normal  R. Abductor hallucis Normal None None None _______ Normal Normal Normal Reduced

## 2020-01-09 ENCOUNTER — Telehealth: Payer: Self-pay | Admitting: Neurology

## 2020-01-09 NOTE — Telephone Encounter (Signed)
I returned the call to the patient. Reports worsening gait and weakness over the last week. He would like to come in for an evaluation. I offered an appt for today at 3pm but he was unable to make it. He will come tomorrow at 7:30am.

## 2020-01-09 NOTE — Telephone Encounter (Signed)
Pt called stating that he is having trouble staying on his feet and he is wanting to be advised on what he can do for this. Please advise.

## 2020-01-10 ENCOUNTER — Other Ambulatory Visit: Payer: Self-pay

## 2020-01-10 ENCOUNTER — Ambulatory Visit: Payer: Medicaid Other | Admitting: Neurology

## 2020-01-10 ENCOUNTER — Telehealth: Payer: Self-pay | Admitting: Neurology

## 2020-01-10 ENCOUNTER — Encounter: Payer: Self-pay | Admitting: Neurology

## 2020-01-10 VITALS — BP 140/83 | HR 97

## 2020-01-10 DIAGNOSIS — R269 Unspecified abnormalities of gait and mobility: Secondary | ICD-10-CM | POA: Diagnosis not present

## 2020-01-10 DIAGNOSIS — R799 Abnormal finding of blood chemistry, unspecified: Secondary | ICD-10-CM | POA: Diagnosis not present

## 2020-01-10 DIAGNOSIS — R7989 Other specified abnormal findings of blood chemistry: Secondary | ICD-10-CM | POA: Diagnosis not present

## 2020-01-10 DIAGNOSIS — G6181 Chronic inflammatory demyelinating polyneuritis: Secondary | ICD-10-CM
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
## 2020-01-10 NOTE — Telephone Encounter (Signed)
[facility] community auth: [id number] (exp. 01/10/20 to 02/24/20) order sent to GI. They will reach out to the patient to schedule.

## 2020-01-16 ENCOUNTER — Ambulatory Visit
Admission: RE | Admit: 2020-01-16 | Discharge: 2020-01-16 | Disposition: A | Discharge status: Home / Self Care | Payer: Medicaid Other | Source: Ambulatory Visit | Attending: Neurology | Admitting: Neurology

## 2020-01-16 ENCOUNTER — Other Ambulatory Visit: Payer: Self-pay

## 2020-01-16 DIAGNOSIS — G6181 Chronic inflammatory demyelinating polyneuritis: Secondary | ICD-10-CM

## 2020-01-16 DIAGNOSIS — R269 Unspecified abnormalities of gait and mobility: Secondary | ICD-10-CM | POA: Diagnosis not present

## 2020-01-17 ENCOUNTER — Telehealth: Payer: Self-pay | Admitting: Neurology

## 2020-01-17 LAB — TSH: TSH: 2.48 u[IU]/mL (ref 0.450–4.500)

## 2020-01-17 LAB — ACETYLCHOLINE RECEPTOR AB, ALL
AChR Binding Ab, Serum: <0.03 nmol/L (ref 0.00–0.24)
Acetylchol Block Ab: 18 % (ref 0–25)

## 2020-01-17 LAB — CK: Total CK: 55 U/L (ref 41–331)

## 2020-01-17 NOTE — Telephone Encounter (Signed)
Called and spoke with pt about MRI results per Dr. Terrace Arabia note. Pt verbalized understanding.  He has not hear about getting home PT scheduled. I instructed him to call [facility] at [phone] to get scheduled. He verbalized understanding and appreciation.

## 2020-01-17 NOTE — Telephone Encounter (Signed)
IMPRESSION: This MRI of the brain without contrast shows the following: 1.   Multiple punctate T2/FLAIR hyperintense foci in the subcortical and deep white matter of the hemispheres.  This is a nonspecific finding but is most consistent with mild chronic microvascular ischemic change.  None of the foci appear to be acute. 2.   No acute findings.  Please call patient, MRI of the brain showed mild age-related changes, there was no acute abnormalities.

## 2020-02-07 ENCOUNTER — Telehealth: Payer: Self-pay | Admitting: *Deleted

## 2020-02-07 DIAGNOSIS — R269 Unspecified abnormalities of gait and mobility: Secondary | ICD-10-CM

## 2020-02-07 DIAGNOSIS — G6181 Chronic inflammatory demyelinating polyneuritis: Secondary | ICD-10-CM

## 2020-02-07 NOTE — Telephone Encounter (Signed)
Pt called request a referral to the [facility] in [place]. He left this fax number [phone]

## 2020-02-08 NOTE — Addendum Note (Signed)
Addended by: Levert Feinstein on: 02/08/2020 11:25 AM   Modules accepted: Orders

## 2020-02-08 NOTE — Telephone Encounter (Signed)
Orders Placed This Encounter  Procedures  . Ambulatory referral to Neurology   

## 2020-02-09 ENCOUNTER — Telehealth: Payer: Self-pay | Admitting: Neurology

## 2020-02-09 DIAGNOSIS — R269 Unspecified abnormalities of gait and mobility: Secondary | ICD-10-CM

## 2020-02-09 NOTE — Telephone Encounter (Signed)
Orders Placed This Encounter  Procedures  . Ambulatory referral to Neurology   

## 2020-02-09 NOTE — Telephone Encounter (Signed)
Pt called wanting to know if a referral and his medical records can be faxed to [facility] at [phone]

## 2020-02-13 NOTE — Telephone Encounter (Signed)
Referral has been with number provided  buy patient [facility] at [phone]  °

## 2020-02-13 NOTE — Telephone Encounter (Signed)
Referral has been with number provided  buy patient [facility] at [phone]

## 2020-02-15 ENCOUNTER — Telehealth: Payer: Self-pay

## 2020-02-15 NOTE — Telephone Encounter (Signed)
Sent via [employer] [phone] - Fax [phone] -  Patient is aware of all details .  [facility] I have talked tom patient.

## 2020-02-22 DIAGNOSIS — Z20822 Contact with and (suspected) exposure to covid-19: Secondary | ICD-10-CM | POA: Diagnosis not present

## 2020-04-16 ENCOUNTER — Telehealth: Payer: Self-pay | Admitting: Neurology

## 2020-04-16 NOTE — Telephone Encounter (Signed)
Multiple visit before failed to demonstrate clear etiology, also variable effort on examinations,  We discussed with his primary care for pain management, or ask his primary care for referral for pain management,  Please keep previous appointment

## 2020-04-16 NOTE — Telephone Encounter (Signed)
Pt has called to report that neither cyclobenzaprine (FLEXERIL) 10 MG tablet or DULoxetine (CYMBALTA) 60 MG capsule are helping with his pain.  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

## 2020-04-16 NOTE — Telephone Encounter (Signed)
Dr. Terrace Arabia- what would you recommend? He has appt with Maralyn Sago on 05/09/20. There are no sooner appt with either you or Sarah at this time.

## 2020-04-16 NOTE — Telephone Encounter (Signed)
Called pt back. Relayed Dr. Zannie Cove message. Pt verbalized understanding. He is moving soon and has appt with [facility] in April. He was wanting alternative option until then. He will reach out to PCP for further recommendation/see if he can see pain management before moving. Nothing further needed.
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
## 2020-05-09 ENCOUNTER — Telehealth: Payer: Self-pay | Admitting: Neurology

## 2020-05-09 ENCOUNTER — Ambulatory Visit: Payer: Medicaid Other | Admitting: Neurology

## 2020-05-09 ENCOUNTER — Encounter: Payer: Self-pay | Admitting: Neurology

## 2020-05-09 VITALS — BP 160/100 | HR 88 | Ht 74.0 in | Wt 280.0 lb

## 2020-05-09 DIAGNOSIS — R269 Unspecified abnormalities of gait and mobility: Secondary | ICD-10-CM | POA: Diagnosis not present

## 2020-05-09 DIAGNOSIS — G6289 Other specified polyneuropathies: Secondary | ICD-10-CM

## 2020-05-09 DIAGNOSIS — G609 Hereditary and idiopathic neuropathy, unspecified: Secondary | ICD-10-CM | POA: Insufficient documentation

## 2020-05-09 DIAGNOSIS — G629 Polyneuropathy, unspecified: Secondary | ICD-10-CM | POA: Insufficient documentation

## 2020-05-09 MED ORDER — NORTRIPTYLINE HCL 10 MG PO CAPS
ORAL_CAPSULE | ORAL | 5 refills | Status: DC
Start: 2020-05-09 — End: 2020-06-26

## 2020-05-09 NOTE — Telephone Encounter (Signed)
Please ask his PCP to reach out to schedule follow-up needs long term management of anticoagulation.  He claims he does not know who his PCP is. Needs follow-up for routine maintenance care.

## 2020-05-09 NOTE — Telephone Encounter (Addendum)
Maralyn Sago called and spoke to patient and relayed Please call PCP for  long term management of anticoagulation.  Patient understood details and he will call his PCP .   Patient stated stated he will be going to [facility] in less than a month he did not see why he needed to do this . I relayed to patient please follow Sarah's instructions .

## 2020-05-09 NOTE — Patient Instructions (Signed)
Try the nortriptyline 10 mg at bedtime for 1 week, then take 20 mg at bedtime for the neuropathy Keep appointment with [facility]  Please see your primary doctor, elevate the feet and legs See you back in 6 months

## 2020-05-13 ENCOUNTER — Other Ambulatory Visit: Payer: Self-pay | Admitting: Neurology

## 2020-05-31 ENCOUNTER — Other Ambulatory Visit: Payer: Self-pay | Admitting: Neurology

## 2020-06-01 ENCOUNTER — Other Ambulatory Visit: Payer: Self-pay | Admitting: Neurology

## 2020-06-01 DIAGNOSIS — M720 Palmar fascial fibromatosis [Dupuytren]: Secondary | ICD-10-CM

## 2020-06-01 DIAGNOSIS — G610 Guillain-Barre syndrome: Secondary | ICD-10-CM

## 2020-06-08 DIAGNOSIS — R200 Anesthesia of skin: Secondary | ICD-10-CM | POA: Diagnosis not present

## 2020-06-08 DIAGNOSIS — M720 Palmar fascial fibromatosis [Dupuytren]: Secondary | ICD-10-CM | POA: Diagnosis not present

## 2020-06-08 DIAGNOSIS — G5793 Unspecified mononeuropathy of bilateral lower limbs: Secondary | ICD-10-CM | POA: Diagnosis not present

## 2020-06-08 DIAGNOSIS — G603 Idiopathic progressive neuropathy: Secondary | ICD-10-CM | POA: Diagnosis not present

## 2020-06-23 ENCOUNTER — Other Ambulatory Visit: Payer: Self-pay | Admitting: Neurology

## 2020-06-25 DIAGNOSIS — Z0271 Encounter for disability determination: Secondary | ICD-10-CM

## 2020-07-09 ENCOUNTER — Telehealth: Payer: Self-pay | Admitting: Nurse Practitioner

## 2020-07-09 NOTE — Telephone Encounter (Signed)
Provider Meredeth Ide working virtual 5/11. Called Pt no answer. Left vm that appt is virtual. If in person is preferred to call [phone] to reschedule

## 2020-07-11 ENCOUNTER — Other Ambulatory Visit: Payer: Self-pay

## 2020-07-11 ENCOUNTER — Ambulatory Visit: Payer: Medicaid Other | Attending: Nurse Practitioner | Admitting: Nurse Practitioner

## 2020-07-11 ENCOUNTER — Encounter: Payer: Self-pay | Admitting: Nurse Practitioner

## 2020-07-11 DIAGNOSIS — R7309 Other abnormal glucose: Secondary | ICD-10-CM | POA: Diagnosis not present

## 2020-07-11 DIAGNOSIS — Z86711 Personal history of pulmonary embolism: Secondary | ICD-10-CM | POA: Diagnosis not present

## 2020-07-11 DIAGNOSIS — G6289 Other specified polyneuropathies: Secondary | ICD-10-CM

## 2020-07-11 MED ORDER — ELIQUIS 5 MG PO TABS: 5.0000 mg | ORAL_TABLET | Freq: Two times a day (BID) | ORAL | 4 refills | Status: DC

## 2020-07-11 MED ORDER — NORTRIPTYLINE HCL 10 MG PO CAPS: ORAL_CAPSULE | ORAL | 1 refills | Status: DC
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
## 2020-07-12 ENCOUNTER — Other Ambulatory Visit: Payer: Self-pay | Admitting: Nurse Practitioner

## 2020-07-12 ENCOUNTER — Other Ambulatory Visit: Payer: Self-pay

## 2020-07-12 ENCOUNTER — Ambulatory Visit: Payer: Medicaid Other | Attending: Nurse Practitioner

## 2020-07-12 DIAGNOSIS — Z125 Encounter for screening for malignant neoplasm of prostate: Secondary | ICD-10-CM | POA: Diagnosis not present

## 2020-07-12 DIAGNOSIS — E785 Hyperlipidemia, unspecified: Secondary | ICD-10-CM

## 2020-07-12 DIAGNOSIS — R739 Hyperglycemia, unspecified: Secondary | ICD-10-CM | POA: Diagnosis not present

## 2020-07-12 DIAGNOSIS — R945 Abnormal results of liver function studies: Secondary | ICD-10-CM

## 2020-07-12 DIAGNOSIS — Z130 Encounter for screening for diseases of the blood and blood-forming organs and certain disorders involving the immune mechanism: Secondary | ICD-10-CM

## 2020-07-12 DIAGNOSIS — Z1211 Encounter for screening for malignant neoplasm of colon: Secondary | ICD-10-CM

## 2020-07-13 LAB — LIPID PANEL
Chol/HDL Ratio: 6.2 ratio — ABNORMAL HIGH (ref 0.0–5.0)
Cholesterol, Total: 236 mg/dL — ABNORMAL HIGH (ref 100–199)
HDL: 38 mg/dL — ABNORMAL LOW (ref >39)
LDL Chol Calc (NIH): 146 mg/dL — ABNORMAL HIGH (ref 0–99)
Triglycerides: 282 mg/dL — ABNORMAL HIGH (ref 0–149)
VLDL Cholesterol Cal: 52 mg/dL — ABNORMAL HIGH (ref 5–40)

## 2020-07-13 LAB — CBC
Hematocrit: 40.9 % (ref 37.5–51.0)
Hemoglobin: 14.8 g/dL (ref 13.0–17.7)
MCH: 39.7 pg — ABNORMAL HIGH (ref 26.6–33.0)
MCHC: 36.2 g/dL — ABNORMAL HIGH (ref 31.5–35.7)
MCV: 110 fL — ABNORMAL HIGH (ref 79–97)
Platelets: 206 10*3/uL (ref 150–450)
RBC: 3.73 x10E6/uL — ABNORMAL LOW (ref 4.14–5.80)
RDW: 14.4 % (ref 11.6–15.4)
WBC: 9.6 10*3/uL (ref 3.4–10.8)

## 2020-07-13 LAB — CMP14+EGFR
ALT: 36 IU/L (ref 0–44)
AST: 43 IU/L — ABNORMAL HIGH (ref 0–40)
Albumin/Globulin Ratio: 1.7 (ref 1.2–2.2)
Albumin: 4.4 g/dL (ref 3.8–4.9)
Alkaline Phosphatase: 103 IU/L (ref 44–121)
BUN/Creatinine Ratio: 9 (ref 9–20)
BUN: 7 mg/dL (ref 6–24)
Bilirubin Total: 0.6 mg/dL (ref 0.0–1.2)
CO2: 20 mmol/L (ref 20–29)
Calcium: 9.4 mg/dL (ref 8.7–10.2)
Chloride: 95 mmol/L — ABNORMAL LOW (ref 96–106)
Creatinine, Ser: 0.8 mg/dL (ref 0.76–1.27)
Globulin, Total: 2.6 g/dL (ref 1.5–4.5)
Glucose: 227 mg/dL — ABNORMAL HIGH (ref 65–99)
Potassium: 4.6 mmol/L (ref 3.5–5.2)
Sodium: 137 mmol/L (ref 134–144)
Total Protein: 7 g/dL (ref 6.0–8.5)
eGFR: 103 mL/min/{1.73_m2} (ref >59)

## 2020-07-13 LAB — PSA: Prostate Specific Ag, Serum: 1.6 ng/mL (ref 0.0–4.0)

## 2020-07-17 ENCOUNTER — Other Ambulatory Visit: Payer: Self-pay | Admitting: Nurse Practitioner

## 2020-07-17 DIAGNOSIS — D539 Nutritional anemia, unspecified: Secondary | ICD-10-CM

## 2020-07-17 MED ORDER — ATORVASTATIN CALCIUM 40 MG PO TABS
40.0000 mg | ORAL_TABLET | Freq: Every day | ORAL | 3 refills | Status: DC
Start: 2020-07-17 — End: 2021-03-24

## 2020-07-18 DIAGNOSIS — G5793 Unspecified mononeuropathy of bilateral lower limbs: Secondary | ICD-10-CM | POA: Diagnosis not present

## 2020-07-18 DIAGNOSIS — R202 Paresthesia of skin: Secondary | ICD-10-CM | POA: Diagnosis not present

## 2020-07-18 DIAGNOSIS — R200 Anesthesia of skin: Secondary | ICD-10-CM | POA: Diagnosis not present

## 2020-07-18 DIAGNOSIS — R609 Edema, unspecified: Secondary | ICD-10-CM | POA: Diagnosis not present

## 2020-07-18 DIAGNOSIS — M720 Palmar fascial fibromatosis [Dupuytren]: Secondary | ICD-10-CM | POA: Diagnosis not present

## 2020-07-21 ENCOUNTER — Encounter: Payer: Self-pay | Admitting: Nurse Practitioner

## 2020-08-27 ENCOUNTER — Other Ambulatory Visit: Payer: Self-pay | Admitting: Neurology

## 2020-08-27 DIAGNOSIS — M720 Palmar fascial fibromatosis [Dupuytren]: Secondary | ICD-10-CM

## 2020-08-27 DIAGNOSIS — G610 Guillain-Barre syndrome: Secondary | ICD-10-CM

## 2020-08-30 ENCOUNTER — Ambulatory Visit: Payer: Self-pay | Admitting: *Deleted

## 2020-08-30 ENCOUNTER — Telehealth: Payer: Self-pay | Admitting: Nurse Practitioner

## 2020-08-30 NOTE — Telephone Encounter (Signed)
Pt reports constant 9/10 headache, left temporal area. Onset 3 weeks ago. States has taken tylenol and IBU, alternating, ineffective. Reports h/o occular migraines "Long ago." Denies any visual changes, no dizziness. States "Feverish at times but nothing to do with this headache." States checks BP, 130s/80s. Reports O2 levels low 90s. Does not recall any injury, "A number of concussions." Advised ED, declines. Reiterated need for ED eval, states "Won't do that, wait for 15 hours." Assured pt NT would route to practice for PCPs review and final disposition. Pt verbalizes understanding.

## 2020-08-30 NOTE — Telephone Encounter (Signed)
Copied from CRM [id number]. Topic: General - Other >> Aug 30, 2020  9:47 AM Jaquita Rector A wrote: Reason for CRM: Patient called in asking to speak to [name] say that it is in reference to a medication. Say that he have been having severe medication for the past 3 weeks, say that the OTC medication is not working. Also stated that the nortriptyline (PAMELOR) 10 MG capsule he only received 60 and was to have gotten 90. Would also like to be started back on the Cyclobenzaprine.  Asking for a call back today at  Ph# [phone]

## 2020-08-30 NOTE — Telephone Encounter (Signed)
Patient asking for a call back say that he have been having severe medication for the past 3 weeks, say that the OTC medication is not working. Asking for a call back today to discuss what he can do call at  Ph# [phone]  Reason for Disposition  [1] SEVERE headache (e.g., excruciating) AND [2] not improved after 2 hours of pain medicine  Answer Assessment - Initial Assessment Questions 1. LOCATION: "Where does it hurt?"      Left front temporal lobe 2. ONSET: "When did the headache start?" (Minutes, hours or days)      3 weeks 3. PATTERN: "Does the pain come and go, or has it been constant since it started?"     constant 4. SEVERITY: "How bad is the pain?" and "What does it keep you from doing?"  (e.g., Scale 1-10; mild, moderate, or severe)   - MILD (1-3): doesn't interfere with normal activities    - MODERATE (4-7): interferes with normal activities or awakens from sleep    - SEVERE (8-10): excruciating pain, unable to do any normal activities        9/10 5. RECURRENT SYMPTOM: "Have you ever had headaches before?" If Yes, ask: "When was the last time?" and "What happened that time?"      Did not last long 6. CAUSE: "What do you think is causing the headache?"     unsure 7. MIGRAINE: "Have you been diagnosed with migraine headaches?" If Yes, ask: "Is this headache similar?"     Occular at times 8. HEAD INJURY: "Has there been any recent injury to the head?"      no 9. OTHER SYMPTOMS: "Do you have any other symptoms?" (fever, stiff neck, eye pain, sore throat, cold symptoms)     Feverish at times but not with headache  Protocols used: Headache-A-AH

## 2020-08-30 NOTE — Telephone Encounter (Signed)
Patient triaged this am by another NT and was told to go to ED for symptoms. Patient called back and triaged patient again and patient c/o headache, pain level 9 out of 10 level. Headache x 3 weeks. Tylenol and ibuprofen have not been effective to decrease pain in head located left frontal/ temporal area. Patient's speech noted to be slow and slurred at times. Patient reports he has hx autoimmune disease and speech has not changed. Denies fever, blurred vision. Instructed patient to go to ED and patient refused. Encouraged patient to go to [facility] for evaluation of pain and he also refused. Attempted to schedule appt with PCP for patient and he refused. Patient reports every time he makes appt with PCP he was changed to virtual visit . Attempted to reschedule in August and patient refused. Patient reports PCP does not try to help him. Reinforced PCP will evaluate his symptoms and recommend best course of treatment.  Requested patient to call neurologist for assistance as well. Patient reports he already called and no one talked with him. Patient did request to refill cyclobenzaprine. Medication not of medication list. Reviewed with patient cyclobenzaprine is for muscle pain. Patient frustrated with recommendations from NT and said thank you for trying and ended conversation. Care advise given. Patient verbalized understanding of care advise but refused to go to ED or UC due to wait times and has been multiple times with out relief. Please advise .

## 2020-08-30 NOTE — Telephone Encounter (Signed)
Reason for Disposition  [1] SEVERE headache (e.g., excruciating) AND [2] not improved after 2 hours of pain medicine  Answer Assessment - Initial Assessment Questions 1. LOCATION: "Where does it hurt?"      Left frontal/ temporal area of head 2. ONSET: "When did the headache start?" (Minutes, hours or days)      Over 3 weeks ago  3. PATTERN: "Does the pain come and go, or has it been constant since it started?"     Na  4. SEVERITY: "How bad is the pain?" and "What does it keep you from doing?"  (e.g., Scale 1-10; mild, moderate, or severe)   - MILD (1-3): doesn't interfere with normal activities    - MODERATE (4-7): interferes with normal activities or awakens from sleep    - SEVERE (8-10): excruciating pain, unable to do any normal activities        Level 9  5. RECURRENT SYMPTOM: "Have you ever had headaches before?" If Yes, ask: "When was the last time?" and "What happened that time?"      Yes  6. CAUSE: "What do you think is causing the headache?"     Not sure . Has seen neurology  7. MIGRAINE: "Have you been diagnosed with migraine headaches?" If Yes, ask: "Is this headache similar?"      Yes . Reports this is not like a migraine headache 8. HEAD INJURY: "Has there been any recent injury to the head?"     Na  9. OTHER SYMPTOMS: "Do you have any other symptoms?" (fever, stiff neck, eye pain, sore throat, cold symptoms)     Has had runny nose but did not report how long ago  10. PREGNANCY: "Is there any chance you are pregnant?" "When was your last menstrual period?"       na  Protocols used: [facility]

## 2020-09-04 NOTE — Telephone Encounter (Signed)
Please schedule patient an appointment with PCP or refer to ED or UC

## 2020-09-17 ENCOUNTER — Other Ambulatory Visit: Payer: Self-pay

## 2020-09-17 ENCOUNTER — Inpatient Hospital Stay (HOSPITAL_BASED_OUTPATIENT_CLINIC_OR_DEPARTMENT_OTHER)
Admission: EM | Admit: 2020-09-17 | Discharge: 2020-10-09 | DRG: 438 | Disposition: A | Discharge status: Home / Self Care | Payer: Medicaid Other | Attending: Internal Medicine | Admitting: Internal Medicine

## 2020-09-17 ENCOUNTER — Encounter (HOSPITAL_BASED_OUTPATIENT_CLINIC_OR_DEPARTMENT_OTHER): Payer: Self-pay

## 2020-09-17 ENCOUNTER — Emergency Department (HOSPITAL_BASED_OUTPATIENT_CLINIC_OR_DEPARTMENT_OTHER): Payer: Medicaid Other

## 2020-09-17 DIAGNOSIS — F17210 Nicotine dependence, cigarettes, uncomplicated: Secondary | ICD-10-CM | POA: Diagnosis present

## 2020-09-17 DIAGNOSIS — F419 Anxiety disorder, unspecified: Secondary | ICD-10-CM | POA: Diagnosis present

## 2020-09-17 DIAGNOSIS — E785 Hyperlipidemia, unspecified: Secondary | ICD-10-CM | POA: Diagnosis present

## 2020-09-17 DIAGNOSIS — E872 Acidosis, unspecified: Secondary | ICD-10-CM | POA: Diagnosis present

## 2020-09-17 DIAGNOSIS — K5900 Constipation, unspecified: Secondary | ICD-10-CM

## 2020-09-17 DIAGNOSIS — R21 Rash and other nonspecific skin eruption: Secondary | ICD-10-CM | POA: Diagnosis not present

## 2020-09-17 DIAGNOSIS — Z86718 Personal history of other venous thrombosis and embolism: Secondary | ICD-10-CM

## 2020-09-17 DIAGNOSIS — R030 Elevated blood-pressure reading, without diagnosis of hypertension: Secondary | ICD-10-CM | POA: Diagnosis present

## 2020-09-17 DIAGNOSIS — E8721 Acute metabolic acidosis: Secondary | ICD-10-CM

## 2020-09-17 DIAGNOSIS — K859 Acute pancreatitis without necrosis or infection, unspecified: Secondary | ICD-10-CM

## 2020-09-17 DIAGNOSIS — G609 Hereditary and idiopathic neuropathy, unspecified: Secondary | ICD-10-CM

## 2020-09-17 DIAGNOSIS — R739 Hyperglycemia, unspecified: Secondary | ICD-10-CM

## 2020-09-17 DIAGNOSIS — Z6841 Body Mass Index (BMI) 40.0 and over, adult: Secondary | ICD-10-CM

## 2020-09-17 DIAGNOSIS — K760 Fatty (change of) liver, not elsewhere classified: Secondary | ICD-10-CM | POA: Diagnosis not present

## 2020-09-17 DIAGNOSIS — Z7901 Long term (current) use of anticoagulants: Secondary | ICD-10-CM

## 2020-09-17 DIAGNOSIS — K861 Other chronic pancreatitis: Secondary | ICD-10-CM | POA: Diagnosis present

## 2020-09-17 DIAGNOSIS — K8522 Alcohol induced acute pancreatitis with infected necrosis: Principal | ICD-10-CM | POA: Diagnosis present

## 2020-09-17 DIAGNOSIS — Z86711 Personal history of pulmonary embolism: Secondary | ICD-10-CM

## 2020-09-17 DIAGNOSIS — E860 Dehydration: Secondary | ICD-10-CM | POA: Diagnosis not present

## 2020-09-17 DIAGNOSIS — R140 Abdominal distension (gaseous): Secondary | ICD-10-CM

## 2020-09-17 DIAGNOSIS — E8809 Other disorders of plasma-protein metabolism, not elsewhere classified: Secondary | ICD-10-CM | POA: Diagnosis present

## 2020-09-17 DIAGNOSIS — E1110 Type 2 diabetes mellitus with ketoacidosis without coma: Secondary | ICD-10-CM | POA: Diagnosis present

## 2020-09-17 DIAGNOSIS — E876 Hypokalemia: Secondary | ICD-10-CM | POA: Diagnosis present

## 2020-09-17 DIAGNOSIS — F1029 Alcohol dependence with unspecified alcohol-induced disorder: Secondary | ICD-10-CM

## 2020-09-17 DIAGNOSIS — Z20822 Contact with and (suspected) exposure to covid-19: Secondary | ICD-10-CM | POA: Diagnosis present

## 2020-09-17 DIAGNOSIS — E782 Mixed hyperlipidemia: Secondary | ICD-10-CM | POA: Diagnosis present

## 2020-09-17 DIAGNOSIS — K567 Ileus, unspecified: Secondary | ICD-10-CM

## 2020-09-17 DIAGNOSIS — F32A Depression, unspecified: Secondary | ICD-10-CM | POA: Diagnosis present

## 2020-09-17 DIAGNOSIS — K560 Paralytic ileus: Secondary | ICD-10-CM | POA: Diagnosis not present

## 2020-09-17 DIAGNOSIS — F1021 Alcohol dependence, in remission: Secondary | ICD-10-CM

## 2020-09-17 DIAGNOSIS — Z807 Family history of other malignant neoplasms of lymphoid, hematopoietic and related tissues: Secondary | ICD-10-CM

## 2020-09-17 DIAGNOSIS — Z66 Do not resuscitate: Secondary | ICD-10-CM | POA: Diagnosis present

## 2020-09-17 LAB — URINALYSIS, ROUTINE W REFLEX MICROSCOPIC
Bilirubin Urine: NEGATIVE
Glucose, UA: >1000 mg/dL — AB
Hgb urine dipstick: NEGATIVE
Ketones, ur: 40 mg/dL — AB
Leukocytes,Ua: NEGATIVE
Nitrite: NEGATIVE
Specific Gravity, Urine: 1.038 — ABNORMAL HIGH (ref 1.005–1.030)
pH: 5.5 (ref 5.0–8.0)

## 2020-09-17 LAB — LIPASE, BLOOD: Lipase: 2959 U/L — ABNORMAL HIGH (ref 11–51)

## 2020-09-17 LAB — CBC
HCT: 41.4 % (ref 39.0–52.0)
Hemoglobin: 14.9 g/dL (ref 13.0–17.0)
MCH: 37.5 pg — ABNORMAL HIGH (ref 26.0–34.0)
MCHC: 36 g/dL (ref 30.0–36.0)
MCV: 104.3 fL — ABNORMAL HIGH (ref 80.0–100.0)
Platelets: 171 10*3/uL (ref 150–400)
RBC: 3.97 MIL/uL — ABNORMAL LOW (ref 4.22–5.81)
RDW: 12.8 % (ref 11.5–15.5)
WBC: 22.4 10*3/uL — ABNORMAL HIGH (ref 4.0–10.5)
nRBC: 0 % (ref 0.0–0.2)

## 2020-09-17 LAB — COMPREHENSIVE METABOLIC PANEL
ALT: 50 U/L — ABNORMAL HIGH (ref 0–44)
AST: 39 U/L (ref 15–41)
Albumin: 4.1 g/dL (ref 3.5–5.0)
Alkaline Phosphatase: 80 U/L (ref 38–126)
Anion gap: 25 — ABNORMAL HIGH (ref 5–15)
BUN: 14 mg/dL (ref 6–20)
CO2: 15 mmol/L — ABNORMAL LOW (ref 22–32)
Calcium: 9.3 mg/dL (ref 8.9–10.3)
Chloride: 89 mmol/L — ABNORMAL LOW (ref 98–111)
Creatinine, Ser: 0.86 mg/dL (ref 0.61–1.24)
GFR, Estimated: >60 mL/min (ref >60)
Glucose, Bld: 422 mg/dL — ABNORMAL HIGH (ref 70–99)
Potassium: 4 mmol/L (ref 3.5–5.1)
Sodium: 129 mmol/L — ABNORMAL LOW (ref 135–145)
Total Bilirubin: 0.7 mg/dL (ref 0.3–1.2)
Total Protein: 7.4 g/dL (ref 6.5–8.1)

## 2020-09-17 LAB — RESP PANEL BY RT-PCR (FLU A&B, COVID) ARPGX2
Influenza A by PCR: NEGATIVE
Influenza B by PCR: NEGATIVE
SARS Coronavirus 2 by RT PCR: NEGATIVE

## 2020-09-17 LAB — I-STAT VENOUS BLOOD GAS, ED
Acid-base deficit: 7 mmol/L — ABNORMAL HIGH (ref 0.0–2.0)
Bicarbonate: 17.2 mmol/L — ABNORMAL LOW (ref 20.0–28.0)
Calcium, Ion: 1.13 mmol/L — ABNORMAL LOW (ref 1.15–1.40)
HCT: 46 % (ref 39.0–52.0)
Hemoglobin: 15.6 g/dL (ref 13.0–17.0)
O2 Saturation: 78 %
Patient temperature: 97.7
Potassium: 4.3 mmol/L (ref 3.5–5.1)
Sodium: 124 mmol/L — ABNORMAL LOW (ref 135–145)
TCO2: 18 mmol/L — ABNORMAL LOW (ref 22–32)
pCO2, Ven: 30.3 mmHg — ABNORMAL LOW (ref 44.0–60.0)
pH, Ven: 7.359 (ref 7.250–7.430)
pO2, Ven: 42 mmHg (ref 32.0–45.0)

## 2020-09-17 MED ORDER — LACTATED RINGERS IV BOLUS
1000.0000 mL | Freq: Once | INTRAVENOUS | Status: AC
  Administered 2020-09-17: 1000 mL via INTRAVENOUS

## 2020-09-17 MED ORDER — ONDANSETRON HCL 4 MG/2ML IJ SOLN
4.0000 mg | Freq: Once | INTRAMUSCULAR | Status: AC
  Administered 2020-09-17: 4 mg via INTRAVENOUS
  Filled 2020-09-17: qty 2

## 2020-09-17 MED ORDER — ONDANSETRON 4 MG PO TBDP
4.0000 mg | ORAL_TABLET | Freq: Once | ORAL | Status: AC | PRN
  Administered 2020-09-17: 4 mg via ORAL
  Filled 2020-09-17: qty 1

## 2020-09-17 MED ORDER — LACTATED RINGERS IV SOLN: INTRAVENOUS | Status: DC

## 2020-09-17 MED ORDER — HYDROMORPHONE HCL 1 MG/ML IJ SOLN
1.0000 mg | Freq: Once | INTRAMUSCULAR | Status: AC
Start: 2020-09-17 — End: 2020-09-17
  Administered 2020-09-17: 1 mg via INTRAVENOUS
  Filled 2020-09-17: qty 1

## 2020-09-17 MED ORDER — IOHEXOL 300 MG/ML  SOLN
100.0000 mL | Freq: Once | INTRAMUSCULAR | Status: AC | PRN
  Administered 2020-09-17: 100 mL via INTRAVENOUS

## 2020-09-17 NOTE — ED Notes (Signed)
MD Plunkett made aware of Patients Critical Value: 6.7 Lactic Acid. No New Orders at this time.

## 2020-09-17 NOTE — ED Triage Notes (Signed)
Patient here POV from Home with ABD Pain.   Pain began yesterday PM and continued into day today. Pain is generalized throughout ABD with no specific location or radiation.   Mild Nausea. No Emesis. Patient does complain of Red Blood in Stool yesterday. No Diarrhea. No Medications PTA. A&Ox4. GCS 15. BIB Wheelchair
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
## 2020-09-18 ENCOUNTER — Inpatient Hospital Stay (HOSPITAL_COMMUNITY): Payer: Medicaid Other

## 2020-09-18 ENCOUNTER — Encounter (HOSPITAL_COMMUNITY): Payer: Self-pay | Admitting: Family Medicine

## 2020-09-18 DIAGNOSIS — K560 Paralytic ileus: Secondary | ICD-10-CM | POA: Diagnosis not present

## 2020-09-18 DIAGNOSIS — Z66 Do not resuscitate: Secondary | ICD-10-CM | POA: Diagnosis present

## 2020-09-18 DIAGNOSIS — E1110 Type 2 diabetes mellitus with ketoacidosis without coma: Secondary | ICD-10-CM | POA: Diagnosis present

## 2020-09-18 DIAGNOSIS — Z20822 Contact with and (suspected) exposure to covid-19: Secondary | ICD-10-CM | POA: Diagnosis present

## 2020-09-18 DIAGNOSIS — Z7901 Long term (current) use of anticoagulants: Secondary | ICD-10-CM | POA: Diagnosis not present

## 2020-09-18 DIAGNOSIS — G609 Hereditary and idiopathic neuropathy, unspecified: Secondary | ICD-10-CM | POA: Diagnosis not present

## 2020-09-18 DIAGNOSIS — F1029 Alcohol dependence with unspecified alcohol-induced disorder: Secondary | ICD-10-CM | POA: Diagnosis not present

## 2020-09-18 DIAGNOSIS — Z86711 Personal history of pulmonary embolism: Secondary | ICD-10-CM | POA: Diagnosis not present

## 2020-09-18 DIAGNOSIS — E872 Acidosis, unspecified: Secondary | ICD-10-CM | POA: Diagnosis present

## 2020-09-18 DIAGNOSIS — K760 Fatty (change of) liver, not elsewhere classified: Secondary | ICD-10-CM | POA: Diagnosis not present

## 2020-09-18 DIAGNOSIS — K852 Alcohol induced acute pancreatitis without necrosis or infection: Secondary | ICD-10-CM | POA: Diagnosis not present

## 2020-09-18 DIAGNOSIS — E782 Mixed hyperlipidemia: Secondary | ICD-10-CM

## 2020-09-18 DIAGNOSIS — E860 Dehydration: Secondary | ICD-10-CM | POA: Diagnosis not present

## 2020-09-18 DIAGNOSIS — R21 Rash and other nonspecific skin eruption: Secondary | ICD-10-CM | POA: Diagnosis not present

## 2020-09-18 DIAGNOSIS — K5939 Other megacolon: Secondary | ICD-10-CM | POA: Diagnosis not present

## 2020-09-18 DIAGNOSIS — Z807 Family history of other malignant neoplasms of lymphoid, hematopoietic and related tissues: Secondary | ICD-10-CM | POA: Diagnosis not present

## 2020-09-18 DIAGNOSIS — Z86718 Personal history of other venous thrombosis and embolism: Secondary | ICD-10-CM | POA: Diagnosis not present

## 2020-09-18 DIAGNOSIS — R030 Elevated blood-pressure reading, without diagnosis of hypertension: Secondary | ICD-10-CM | POA: Diagnosis not present

## 2020-09-18 DIAGNOSIS — R1084 Generalized abdominal pain: Secondary | ICD-10-CM | POA: Diagnosis not present

## 2020-09-18 DIAGNOSIS — K7689 Other specified diseases of liver: Secondary | ICD-10-CM | POA: Diagnosis not present

## 2020-09-18 DIAGNOSIS — K8522 Alcohol induced acute pancreatitis with infected necrosis: Secondary | ICD-10-CM | POA: Diagnosis present

## 2020-09-18 DIAGNOSIS — K859 Acute pancreatitis without necrosis or infection, unspecified: Secondary | ICD-10-CM | POA: Diagnosis not present

## 2020-09-18 DIAGNOSIS — K5900 Constipation, unspecified: Secondary | ICD-10-CM | POA: Diagnosis not present

## 2020-09-18 DIAGNOSIS — R109 Unspecified abdominal pain: Secondary | ICD-10-CM | POA: Diagnosis not present

## 2020-09-18 DIAGNOSIS — K8500 Idiopathic acute pancreatitis without necrosis or infection: Secondary | ICD-10-CM | POA: Diagnosis not present

## 2020-09-18 DIAGNOSIS — K567 Ileus, unspecified: Secondary | ICD-10-CM | POA: Diagnosis not present

## 2020-09-18 DIAGNOSIS — E0810 Diabetes mellitus due to underlying condition with ketoacidosis without coma: Secondary | ICD-10-CM | POA: Diagnosis not present

## 2020-09-18 DIAGNOSIS — R197 Diarrhea, unspecified: Secondary | ICD-10-CM | POA: Diagnosis not present

## 2020-09-18 DIAGNOSIS — E876 Hypokalemia: Secondary | ICD-10-CM | POA: Diagnosis present

## 2020-09-18 DIAGNOSIS — F419 Anxiety disorder, unspecified: Secondary | ICD-10-CM | POA: Diagnosis present

## 2020-09-18 DIAGNOSIS — Z6841 Body Mass Index (BMI) 40.0 and over, adult: Secondary | ICD-10-CM | POA: Diagnosis not present

## 2020-09-18 DIAGNOSIS — R933 Abnormal findings on diagnostic imaging of other parts of digestive tract: Secondary | ICD-10-CM | POA: Diagnosis not present

## 2020-09-18 DIAGNOSIS — R739 Hyperglycemia, unspecified: Secondary | ICD-10-CM | POA: Diagnosis not present

## 2020-09-18 DIAGNOSIS — K4020 Bilateral inguinal hernia, without obstruction or gangrene, not specified as recurrent: Secondary | ICD-10-CM | POA: Diagnosis not present

## 2020-09-18 DIAGNOSIS — K6389 Other specified diseases of intestine: Secondary | ICD-10-CM | POA: Diagnosis not present

## 2020-09-18 DIAGNOSIS — E8809 Other disorders of plasma-protein metabolism, not elsewhere classified: Secondary | ICD-10-CM | POA: Diagnosis present

## 2020-09-18 DIAGNOSIS — K8591 Acute pancreatitis with uninfected necrosis, unspecified: Secondary | ICD-10-CM | POA: Diagnosis not present

## 2020-09-18 DIAGNOSIS — F17210 Nicotine dependence, cigarettes, uncomplicated: Secondary | ICD-10-CM | POA: Diagnosis present

## 2020-09-18 DIAGNOSIS — E785 Hyperlipidemia, unspecified: Secondary | ICD-10-CM | POA: Diagnosis present

## 2020-09-18 DIAGNOSIS — R140 Abdominal distension (gaseous): Secondary | ICD-10-CM | POA: Diagnosis not present

## 2020-09-18 DIAGNOSIS — F32A Depression, unspecified: Secondary | ICD-10-CM | POA: Diagnosis present

## 2020-09-18 LAB — LIPASE, BLOOD: Lipase: 1095 U/L — ABNORMAL HIGH (ref 11–51)

## 2020-09-18 LAB — BASIC METABOLIC PANEL
Anion gap: 14 (ref 5–15)
Anion gap: 15 (ref 5–15)
Anion gap: 12 (ref 5–15)
Anion gap: 9 (ref 5–15)
BUN: 12 mg/dL (ref 6–20)
BUN: 10 mg/dL (ref 6–20)
BUN: 11 mg/dL (ref 6–20)
BUN: 11 mg/dL (ref 6–20)
CO2: 19 mmol/L — ABNORMAL LOW (ref 22–32)
CO2: 19 mmol/L — ABNORMAL LOW (ref 22–32)
CO2: 21 mmol/L — ABNORMAL LOW (ref 22–32)
CO2: 22 mmol/L (ref 22–32)
Calcium: 9.1 mg/dL (ref 8.9–10.3)
Calcium: 8.7 mg/dL — ABNORMAL LOW (ref 8.9–10.3)
Calcium: 8.7 mg/dL — ABNORMAL LOW (ref 8.9–10.3)
Calcium: 8.4 mg/dL — ABNORMAL LOW (ref 8.9–10.3)
Chloride: 96 mmol/L — ABNORMAL LOW (ref 98–111)
Chloride: 96 mmol/L — ABNORMAL LOW (ref 98–111)
Chloride: 98 mmol/L (ref 98–111)
Chloride: 98 mmol/L (ref 98–111)
Creatinine, Ser: 0.76 mg/dL (ref 0.61–1.24)
Creatinine, Ser: 0.48 mg/dL — ABNORMAL LOW (ref 0.61–1.24)
Creatinine, Ser: 0.65 mg/dL (ref 0.61–1.24)
Creatinine, Ser: 0.73 mg/dL (ref 0.61–1.24)
GFR, Estimated: >60 mL/min (ref >60)
GFR, Estimated: >60 mL/min (ref >60)
GFR, Estimated: >60 mL/min (ref >60)
GFR, Estimated: >60 mL/min (ref >60)
Glucose, Bld: 146 mg/dL — ABNORMAL HIGH (ref 70–99)
Glucose, Bld: 134 mg/dL — ABNORMAL HIGH (ref 70–99)
Glucose, Bld: 117 mg/dL — ABNORMAL HIGH (ref 70–99)
Glucose, Bld: 121 mg/dL — ABNORMAL HIGH (ref 70–99)
Potassium: 4 mmol/L (ref 3.5–5.1)
Potassium: 3.8 mmol/L (ref 3.5–5.1)
Potassium: 3.6 mmol/L (ref 3.5–5.1)
Potassium: 3.6 mmol/L (ref 3.5–5.1)
Sodium: 129 mmol/L — ABNORMAL LOW (ref 135–145)
Sodium: 130 mmol/L — ABNORMAL LOW (ref 135–145)
Sodium: 131 mmol/L — ABNORMAL LOW (ref 135–145)
Sodium: 129 mmol/L — ABNORMAL LOW (ref 135–145)

## 2020-09-18 LAB — GLUCOSE, CAPILLARY
Glucose-Capillary: 133 mg/dL — ABNORMAL HIGH (ref 70–99)
Glucose-Capillary: 136 mg/dL — ABNORMAL HIGH (ref 70–99)
Glucose-Capillary: 150 mg/dL — ABNORMAL HIGH (ref 70–99)
Glucose-Capillary: 154 mg/dL — ABNORMAL HIGH (ref 70–99)
Glucose-Capillary: 158 mg/dL — ABNORMAL HIGH (ref 70–99)
Glucose-Capillary: 161 mg/dL — ABNORMAL HIGH (ref 70–99)
Glucose-Capillary: 169 mg/dL — ABNORMAL HIGH (ref 70–99)
Glucose-Capillary: 170 mg/dL — ABNORMAL HIGH (ref 70–99)
Glucose-Capillary: 170 mg/dL — ABNORMAL HIGH (ref 70–99)
Glucose-Capillary: 175 mg/dL — ABNORMAL HIGH (ref 70–99)
Glucose-Capillary: 175 mg/dL — ABNORMAL HIGH (ref 70–99)
Glucose-Capillary: 178 mg/dL — ABNORMAL HIGH (ref 70–99)
Glucose-Capillary: 181 mg/dL — ABNORMAL HIGH (ref 70–99)
Glucose-Capillary: 184 mg/dL — ABNORMAL HIGH (ref 70–99)
Glucose-Capillary: 184 mg/dL — ABNORMAL HIGH (ref 70–99)
Glucose-Capillary: 191 mg/dL — ABNORMAL HIGH (ref 70–99)
Glucose-Capillary: 210 mg/dL — ABNORMAL HIGH (ref 70–99)
Glucose-Capillary: 211 mg/dL — ABNORMAL HIGH (ref 70–99)
Glucose-Capillary: 304 mg/dL — ABNORMAL HIGH (ref 70–99)

## 2020-09-18 LAB — LIPID PANEL
Cholesterol: 249 mg/dL — ABNORMAL HIGH (ref 0–200)
HDL: 50 mg/dL (ref >40)
LDL Cholesterol: 163 mg/dL — ABNORMAL HIGH (ref 0–99)
Total CHOL/HDL Ratio: 5 RATIO
Triglycerides: 180 mg/dL — ABNORMAL HIGH (ref <150)
VLDL: 36 mg/dL (ref 0–40)

## 2020-09-18 LAB — LACTIC ACID, PLASMA
Lactic Acid, Venous: 6.4 mmol/L (ref 0.5–1.9)
Lactic Acid, Venous: 6.7 mmol/L (ref 0.5–1.9)
Lactic Acid, Venous: 5.8 mmol/L (ref 0.5–1.9)

## 2020-09-18 LAB — RAPID URINE DRUG SCREEN, HOSP PERFORMED
Amphetamines: NOT DETECTED
Barbiturates: NOT DETECTED
Benzodiazepines: NOT DETECTED
Cocaine: NOT DETECTED
Opiates: POSITIVE — AB
Tetrahydrocannabinol: NOT DETECTED

## 2020-09-18 LAB — BETA-HYDROXYBUTYRIC ACID
Beta-Hydroxybutyric Acid: 0.25 mmol/L (ref 0.05–0.27)
Beta-Hydroxybutyric Acid: 0.16 mmol/L (ref 0.05–0.27)

## 2020-09-18 LAB — HEMOGLOBIN A1C
Hgb A1c MFr Bld: 9.4 % — ABNORMAL HIGH (ref 4.8–5.6)
Mean Plasma Glucose: 223.08 mg/dL

## 2020-09-18 LAB — CBG MONITORING, ED
Glucose-Capillary: 406 mg/dL — ABNORMAL HIGH (ref 70–99)
Glucose-Capillary: 388 mg/dL — ABNORMAL HIGH (ref 70–99)

## 2020-09-18 LAB — MRSA NEXT GEN BY PCR, NASAL: MRSA by PCR Next Gen: DETECTED — AB

## 2020-09-18 LAB — CK: Total CK: 58 U/L (ref 49–397)

## 2020-09-18 LAB — HIV ANTIBODY (ROUTINE TESTING W REFLEX): HIV Screen 4th Generation wRfx: NONREACTIVE

## 2020-09-18 LAB — TRIGLYCERIDES: Triglycerides: 165 mg/dL — ABNORMAL HIGH (ref <150)

## 2020-09-18 MED ORDER — ATORVASTATIN CALCIUM 40 MG PO TABS: 40.0000 mg | ORAL_TABLET | Freq: Every day | ORAL | Status: DC

## 2020-09-18 MED ORDER — HYDRALAZINE HCL 20 MG/ML IJ SOLN
10.0000 mg | Freq: Four times a day (QID) | INTRAMUSCULAR | Status: DC | PRN
  Administered 2020-09-18 (×2): 10 mg via INTRAVENOUS
  Filled 2020-09-18 (×2): qty 1

## 2020-09-18 MED ORDER — HYDROMORPHONE HCL 1 MG/ML IJ SOLN
1.0000 mg | Freq: Once | INTRAMUSCULAR | Status: AC
Start: 2020-09-18 — End: 2020-09-18
  Administered 2020-09-18: 1 mg via INTRAVENOUS
  Filled 2020-09-18: qty 1

## 2020-09-18 MED ORDER — NORTRIPTYLINE HCL 10 MG PO CAPS
20.0000 mg | ORAL_CAPSULE | Freq: Every day | ORAL | Status: DC
  Administered 2020-09-19 – 2020-10-08 (×20): 20 mg via ORAL
  Filled 2020-09-18 (×23): qty 2

## 2020-09-18 MED ORDER — PANTOPRAZOLE SODIUM 40 MG IV SOLR
40.0000 mg | Freq: Every day | INTRAVENOUS | Status: DC
  Administered 2020-09-18 – 2020-09-22 (×5): 40 mg via INTRAVENOUS
  Filled 2020-09-18 (×5): qty 40

## 2020-09-18 MED ORDER — DEXTROSE 50 % IV SOLN
0.0000 mL | INTRAVENOUS | Status: DC | PRN
  Filled 2020-09-18: qty 50

## 2020-09-18 MED ORDER — KETOROLAC TROMETHAMINE 15 MG/ML IJ SOLN
15.0000 mg | Freq: Once | INTRAMUSCULAR | Status: AC
  Administered 2020-09-18: 15 mg via INTRAVENOUS
  Filled 2020-09-18: qty 1

## 2020-09-18 MED ORDER — FENTANYL CITRATE (PF) 100 MCG/2ML IJ SOLN: 25.0000 ug | INTRAMUSCULAR | Status: DC | PRN

## 2020-09-18 MED ORDER — ONDANSETRON HCL 4 MG PO TABS
4.0000 mg | ORAL_TABLET | Freq: Four times a day (QID) | ORAL | Status: DC | PRN
  Administered 2020-10-05: 4 mg via ORAL
  Filled 2020-09-18: qty 1

## 2020-09-18 MED ORDER — POLYETHYLENE GLYCOL 3350 17 G PO PACK: 17.0000 g | PACK | Freq: Every day | ORAL | Status: DC | PRN

## 2020-09-18 MED ORDER — HYDROMORPHONE HCL 1 MG/ML IJ SOLN: 0.5000 mg | INTRAMUSCULAR | Status: DC | PRN

## 2020-09-18 MED ORDER — DEXTROSE 50 % IV SOLN: 0.0000 mL | INTRAVENOUS | Status: DC | PRN

## 2020-09-18 MED ORDER — DEXTROSE IN LACTATED RINGERS 5 % IV SOLN: INTRAVENOUS | Status: DC

## 2020-09-18 MED ORDER — ACETAMINOPHEN 650 MG RE SUPP: 650.0000 mg | Freq: Four times a day (QID) | RECTAL | Status: DC | PRN

## 2020-09-18 MED ORDER — POTASSIUM CHLORIDE 10 MEQ/100ML IV SOLN
10.0000 meq | INTRAVENOUS | Status: AC
  Administered 2020-09-18 (×2): 10 meq via INTRAVENOUS
  Filled 2020-09-18 (×2): qty 100

## 2020-09-18 MED ORDER — SODIUM CHLORIDE 0.9 % IV BOLUS
500.0000 mL | Freq: Once | INTRAVENOUS | Status: AC
  Administered 2020-09-18: 500 mL via INTRAVENOUS

## 2020-09-18 MED ORDER — INSULIN REGULAR(HUMAN) IN NACL 100-0.9 UT/100ML-% IV SOLN
INTRAVENOUS | Status: DC
  Administered 2020-09-18: 19 [IU]/h via INTRAVENOUS
  Filled 2020-09-18: qty 100

## 2020-09-18 MED ORDER — ONDANSETRON HCL 4 MG/2ML IJ SOLN
4.0000 mg | Freq: Four times a day (QID) | INTRAMUSCULAR | Status: DC | PRN
  Administered 2020-09-21 – 2020-10-06 (×5): 4 mg via INTRAVENOUS
  Filled 2020-09-18 (×6): qty 2

## 2020-09-18 MED ORDER — NORTRIPTYLINE HCL 10 MG PO CAPS
10.0000 mg | ORAL_CAPSULE | Freq: Every day | ORAL | Status: DC
  Administered 2020-09-19 – 2020-10-09 (×21): 10 mg via ORAL
  Filled 2020-09-18 (×22): qty 1

## 2020-09-18 MED ORDER — HYDROMORPHONE HCL 1 MG/ML IJ SOLN
2.0000 mg | Freq: Once | INTRAMUSCULAR | Status: AC
  Administered 2020-09-18: 2 mg via INTRAVENOUS
  Filled 2020-09-18: qty 2

## 2020-09-18 MED ORDER — LACTATED RINGERS IV SOLN
INTRAVENOUS | Status: DC
Start: 2020-09-18 — End: 2020-09-20

## 2020-09-18 MED ORDER — HYDROMORPHONE HCL 1 MG/ML IJ SOLN
1.0000 mg | INTRAMUSCULAR | Status: DC | PRN
Start: 2020-09-18 — End: 2020-09-18
  Administered 2020-09-18: 1 mg via INTRAVENOUS
  Filled 2020-09-18: qty 1

## 2020-09-18 MED ORDER — FENTANYL CITRATE (PF) 100 MCG/2ML IJ SOLN
50.0000 ug | INTRAMUSCULAR | Status: DC | PRN
  Administered 2020-09-18: 50 ug via INTRAVENOUS
  Filled 2020-09-18: qty 2

## 2020-09-18 MED ORDER — LACTATED RINGERS IV BOLUS
1000.0000 mL | Freq: Once | INTRAVENOUS | Status: AC
  Administered 2020-09-18: 1000 mL via INTRAVENOUS

## 2020-09-18 MED ORDER — ACETAMINOPHEN 325 MG PO TABS
650.0000 mg | ORAL_TABLET | Freq: Four times a day (QID) | ORAL | Status: DC | PRN
  Administered 2020-09-23 – 2020-09-25 (×2): 650 mg via ORAL
  Filled 2020-09-18 (×2): qty 2

## 2020-09-18 MED ORDER — MORPHINE SULFATE (PF) 4 MG/ML IV SOLN
4.0000 mg | INTRAVENOUS | Status: DC | PRN
  Administered 2020-09-18: 6 mg via INTRAVENOUS
  Administered 2020-09-18: 4 mg via INTRAVENOUS
  Administered 2020-09-18 (×2): 6 mg via INTRAVENOUS
  Administered 2020-09-19: 4 mg via INTRAVENOUS
  Administered 2020-09-19: 6 mg via INTRAVENOUS
  Administered 2020-09-20 – 2020-09-25 (×9): 4 mg via INTRAVENOUS
  Filled 2020-09-18: qty 1
  Filled 2020-09-18: qty 2
  Filled 2020-09-18 (×4): qty 1
  Filled 2020-09-18: qty 2
  Filled 2020-09-18 (×2): qty 1
  Filled 2020-09-18: qty 2
  Filled 2020-09-18 (×2): qty 1
  Filled 2020-09-18: qty 2
  Filled 2020-09-18 (×2): qty 1

## 2020-09-18 MED ORDER — CHLORHEXIDINE GLUCONATE CLOTH 2 % EX PADS
6.0000 | MEDICATED_PAD | Freq: Every day | CUTANEOUS | Status: DC
  Administered 2020-09-18 – 2020-10-01 (×14): 6 via TOPICAL

## 2020-09-18 MED ORDER — SIMETHICONE 80 MG PO CHEW
80.0000 mg | CHEWABLE_TABLET | Freq: Four times a day (QID) | ORAL | Status: DC | PRN
  Administered 2020-09-21 – 2020-09-24 (×2): 80 mg via ORAL
  Filled 2020-09-18 (×3): qty 1

## 2020-09-18 MED ORDER — LACTATED RINGERS IV SOLN: INTRAVENOUS | Status: DC

## 2020-09-18 MED ORDER — LORAZEPAM 2 MG/ML IJ SOLN
1.0000 mg | Freq: Once | INTRAMUSCULAR | Status: AC
  Administered 2020-09-18: 1 mg via INTRAVENOUS
  Filled 2020-09-18: qty 1

## 2020-09-18 MED ORDER — MUPIROCIN 2 % EX OINT
1.0000 "application " | TOPICAL_OINTMENT | Freq: Two times a day (BID) | CUTANEOUS | Status: AC
  Administered 2020-09-18 – 2020-09-22 (×10): 1 via NASAL
  Filled 2020-09-18 (×2): qty 22

## 2020-09-18 MED ORDER — LORAZEPAM 2 MG/ML IJ SOLN
1.0000 mg | INTRAMUSCULAR | Status: DC | PRN
  Administered 2020-09-18: 1 mg via INTRAVENOUS
  Filled 2020-09-18: qty 1

## 2020-09-18 MED ORDER — APIXABAN 5 MG PO TABS: 5.0000 mg | ORAL_TABLET | Freq: Two times a day (BID) | ORAL | Status: DC

## 2020-09-18 MED ORDER — INSULIN REGULAR(HUMAN) IN NACL 100-0.9 UT/100ML-% IV SOLN
INTRAVENOUS | Status: DC
  Administered 2020-09-18: 7 [IU]/h via INTRAVENOUS
  Administered 2020-09-19: 4.8 [IU]/h via INTRAVENOUS
  Filled 2020-09-18 (×2): qty 100

## 2020-09-18 MED ORDER — ENOXAPARIN SODIUM 120 MG/0.8ML IJ SOSY
120.0000 mg | PREFILLED_SYRINGE | Freq: Two times a day (BID) | INTRAMUSCULAR | Status: DC
  Administered 2020-09-18 – 2020-09-20 (×6): 120 mg via SUBCUTANEOUS
  Filled 2020-09-18 (×7): qty 0.8

## 2020-09-18 MED ORDER — HYDROMORPHONE HCL 1 MG/ML IJ SOLN
1.0000 mg | Freq: Once | INTRAMUSCULAR | Status: AC
  Administered 2020-09-18: 1 mg via INTRAVENOUS
  Filled 2020-09-18: qty 1
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
## 2020-09-18 NOTE — ED Notes (Signed)
Report given to [facility]; all Questions answered.

## 2020-09-18 NOTE — ED Notes (Signed)
Report called to [facility]

## 2020-09-18 NOTE — Progress Notes (Signed)
ANTICOAGULATION CONSULT NOTE - Initial Consult  Pharmacy Consult for Enoxaparin Indication: Hx PE/DVT, apixaban on hold  No Known Allergies  Patient Measurements: Height: 6\' 1"  (185.4 cm) Weight: 117.9 kg (260 lb) IBW/kg (Calculated) : 79.9 Heparin Dosing Weight:  Vital Signs: Temp: 98.2 F (36.8 C) (07/19 0800) Temp Source: Axillary (07/19 0800) BP: 177/92 (07/19 0600) Pulse Rate: 103 (07/19 0600)  Labs: Recent Labs    09/17/20 1918 09/17/20 2203 09/18/20 0516 09/18/20 0717  HGB 14.9 15.6  --   --   HCT 41.4 46.0  --   --   PLT 171  --   --   --   CREATININE 0.86  --   --  0.76  CKTOTAL  --   --  58  --     Estimated Creatinine Clearance: 135.4 mL/min (by C-G formula based on SCr of 0.76 mg/dL).   Medical History: Past Medical History:  Diagnosis Date   Ascending paralysis (HCC) 06/2019   DVT (deep venous thrombosis) (HCC) 09/09/2017   Empyema lung (HCC)    Pulmonary embolism (HCC) 09/08/2017    Medications: Scheduled:   atorvastatin  40 mg Oral Daily   Chlorhexidine Gluconate Cloth  6 each Topical Daily   mupirocin ointment  1 application Nasal BID   nortriptyline  20 mg Oral QHS     Assessment: 89 yoM presented to ED with abdominal pain on 7/18 and likely alcohol related pancreatitis.  PMH includes PE/DVT (2019) on chronic apixaban anticoagulation.  Pharmacy is consulted to transition to enoxaparin.  PTA apixaban 5 mg BID.  LD on 7/18 at 18:30.  Today, 09/18/2020: SCr 0.76, CrCl > 30 ml/min CBC:  Hgb and Plt wnl No bleeding or complications noted.  Goal of Therapy:  Anti-Xa level 0.6-1 units/ml 4hrs after LMWH dose given Monitor platelets by anticoagulation protocol: Yes   Plan:  Enoxaparin 1 mg/kg (120mg ) SQ q12h Follow up renal function, CBC, s/s bleeding Follow up long-term anticoagulation plans.   09/20/2020 PharmD, BCPS Clinical Pharmacist [facility] main pharmacy [phone] 09/18/2020 8:53 AM

## 2020-09-18 NOTE — Progress Notes (Addendum)
Inpatient Diabetes Program Recommendations  AACE/ADA: New Consensus Statement on Inpatient Glycemic Control (2015)  Target Ranges:  Prepandial:   less than 140 mg/dL      Peak postprandial:   less than 180 mg/dL (1-2 hours)      Critically ill patients:  140 - 180 mg/dL   Lab Results  Component Value Date   GLUCAP 184 (H) 09/18/2020   HGBA1C 9.4 (H) 09/18/2020    Review of Glycemic Control  Diabetes history: None - new-onset Outpatient Diabetes medications: None Current orders for Inpatient glycemic control: IV insulin per [employer] for DKA  HgbA1C - 9.4% - average blood sugar 223 mg/dL. Clear diagnosis new-onset DM  Inpatient Diabetes Program Recommendations:    Continue with IV insulin until criteria met for discontinuation. Give Lantus 2H prior to stopping the drip.  Lantus 18 units QD Novolog 0-15 units Q4H When eating, will likely need meal coverage insulin.  Spoke with pt at bedside regarding new diagnosis of diabetes. Explained HgbA1C of 9.4%, meaning his average blood sugar has been 223 mg/dL over past 2-3 months. Will speak with pt about DM2 when pt not groggy. Also discussed with RN.  Thank you. Lorenda Peck, RD, LDN, CDE Inpatient Diabetes Coordinator [phone]

## 2020-09-18 NOTE — ED Notes (Signed)
MD Delo made aware of Patients Critical Result: Lactic Acid 6.4. Review MAR for Further Intervention.

## 2020-09-18 NOTE — Progress Notes (Addendum)
Patient admitted after midnight please see H&P.  Here with pancreatitis.  Patient acknowledged that he DOES drink alcohol-- wine/beer/occasional hard liquor.  Triglycerides not markedly elevated so pancreatitis most likely from alcohol.  Pain still not controlled-- patient asking for crushed up percocet.  Will adjust IV pain meds while patient NPO. -u/s w/o stones -continue fluid, NPO and pain control  Marlin Canary DO

## 2020-09-18 NOTE — Progress Notes (Signed)
Paged admission line to inform that patient has arrived to unit and in need of pain and nausea meds asap.

## 2020-09-19 DIAGNOSIS — E0810 Diabetes mellitus due to underlying condition with ketoacidosis without coma: Secondary | ICD-10-CM | POA: Diagnosis not present

## 2020-09-19 DIAGNOSIS — R030 Elevated blood-pressure reading, without diagnosis of hypertension: Secondary | ICD-10-CM | POA: Diagnosis not present

## 2020-09-19 DIAGNOSIS — F1029 Alcohol dependence with unspecified alcohol-induced disorder: Secondary | ICD-10-CM

## 2020-09-19 DIAGNOSIS — F1021 Alcohol dependence, in remission: Secondary | ICD-10-CM

## 2020-09-19 DIAGNOSIS — K8500 Idiopathic acute pancreatitis without necrosis or infection: Secondary | ICD-10-CM | POA: Diagnosis not present

## 2020-09-19 DIAGNOSIS — G609 Hereditary and idiopathic neuropathy, unspecified: Secondary | ICD-10-CM | POA: Diagnosis not present

## 2020-09-19 LAB — GLUCOSE, CAPILLARY
Glucose-Capillary: 157 mg/dL — ABNORMAL HIGH (ref 70–99)
Glucose-Capillary: 171 mg/dL — ABNORMAL HIGH (ref 70–99)
Glucose-Capillary: 189 mg/dL — ABNORMAL HIGH (ref 70–99)
Glucose-Capillary: 226 mg/dL — ABNORMAL HIGH (ref 70–99)
Glucose-Capillary: 140 mg/dL — ABNORMAL HIGH (ref 70–99)
Glucose-Capillary: 191 mg/dL — ABNORMAL HIGH (ref 70–99)
Glucose-Capillary: 192 mg/dL — ABNORMAL HIGH (ref 70–99)
Glucose-Capillary: 169 mg/dL — ABNORMAL HIGH (ref 70–99)
Glucose-Capillary: 179 mg/dL — ABNORMAL HIGH (ref 70–99)
Glucose-Capillary: 189 mg/dL — ABNORMAL HIGH (ref 70–99)
Glucose-Capillary: 169 mg/dL — ABNORMAL HIGH (ref 70–99)
Glucose-Capillary: 227 mg/dL — ABNORMAL HIGH (ref 70–99)

## 2020-09-19 LAB — CBC
HCT: 42.4 % (ref 39.0–52.0)
Hemoglobin: 15.4 g/dL (ref 13.0–17.0)
MCH: 37.9 pg — ABNORMAL HIGH (ref 26.0–34.0)
MCHC: 36.3 g/dL — ABNORMAL HIGH (ref 30.0–36.0)
MCV: 104.4 fL — ABNORMAL HIGH (ref 80.0–100.0)
Platelets: 79 10*3/uL — ABNORMAL LOW (ref 150–400)
RBC: 4.06 MIL/uL — ABNORMAL LOW (ref 4.22–5.81)
RDW: 12.7 % (ref 11.5–15.5)
WBC: 24.8 10*3/uL — ABNORMAL HIGH (ref 4.0–10.5)
nRBC: 0 % (ref 0.0–0.2)

## 2020-09-19 LAB — PROCALCITONIN: Procalcitonin: 0.69 ng/mL

## 2020-09-19 LAB — COMPREHENSIVE METABOLIC PANEL
ALT: 26 U/L (ref 0–44)
AST: 40 U/L (ref 15–41)
Albumin: 2.6 g/dL — ABNORMAL LOW (ref 3.5–5.0)
Alkaline Phosphatase: 54 U/L (ref 38–126)
Anion gap: 10 (ref 5–15)
BUN: 12 mg/dL (ref 6–20)
CO2: 22 mmol/L (ref 22–32)
Calcium: 8 mg/dL — ABNORMAL LOW (ref 8.9–10.3)
Chloride: 100 mmol/L (ref 98–111)
Creatinine, Ser: 0.75 mg/dL (ref 0.61–1.24)
GFR, Estimated: >60 mL/min (ref >60)
Glucose, Bld: 169 mg/dL — ABNORMAL HIGH (ref 70–99)
Potassium: 3.9 mmol/L (ref 3.5–5.1)
Sodium: 132 mmol/L — ABNORMAL LOW (ref 135–145)
Total Bilirubin: 2.4 mg/dL — ABNORMAL HIGH (ref 0.3–1.2)
Total Protein: 5.4 g/dL — ABNORMAL LOW (ref 6.5–8.1)

## 2020-09-19 LAB — MAGNESIUM: Magnesium: 1.3 mg/dL — ABNORMAL LOW (ref 1.7–2.4)

## 2020-09-19 LAB — LIPASE, BLOOD: Lipase: 211 U/L — ABNORMAL HIGH (ref 11–51)

## 2020-09-19 LAB — LACTIC ACID, PLASMA: Lactic Acid, Venous: 2.1 mmol/L (ref 0.5–1.9)

## 2020-09-19 LAB — PHOSPHORUS: Phosphorus: 2.1 mg/dL — ABNORMAL LOW (ref 2.5–4.6)

## 2020-09-19 MED ORDER — INSULIN ASPART 100 UNIT/ML IJ SOLN
0.0000 [IU] | Freq: Every day | INTRAMUSCULAR | Status: DC
  Administered 2020-09-19: 2 [IU] via SUBCUTANEOUS

## 2020-09-19 MED ORDER — THIAMINE HCL 100 MG PO TABS
100.0000 mg | ORAL_TABLET | Freq: Every day | ORAL | Status: DC
  Administered 2020-09-20 – 2020-09-28 (×9): 100 mg via ORAL
  Filled 2020-09-19 (×9): qty 1

## 2020-09-19 MED ORDER — LIVING WELL WITH DIABETES BOOK
Freq: Once | Status: AC
  Filled 2020-09-19: qty 1

## 2020-09-19 MED ORDER — SODIUM CHLORIDE 0.9 % IV SOLN: INTRAVENOUS | Status: DC

## 2020-09-19 MED ORDER — SODIUM CHLORIDE 0.9 % IV BOLUS
2000.0000 mL | Freq: Once | INTRAVENOUS | Status: AC
  Administered 2020-09-19: 2000 mL via INTRAVENOUS

## 2020-09-19 MED ORDER — FOLIC ACID 1 MG PO TABS
1.0000 mg | ORAL_TABLET | Freq: Every day | ORAL | Status: DC
  Administered 2020-09-19 – 2020-09-24 (×6): 1 mg via ORAL
  Filled 2020-09-19 (×6): qty 1

## 2020-09-19 MED ORDER — ADULT MULTIVITAMIN W/MINERALS CH
1.0000 | ORAL_TABLET | Freq: Every day | ORAL | Status: DC
  Administered 2020-09-19 – 2020-09-25 (×7): 1 via ORAL
  Filled 2020-09-19 (×7): qty 1

## 2020-09-19 MED ORDER — THIAMINE HCL 100 MG/ML IJ SOLN
100.0000 mg | Freq: Every day | INTRAMUSCULAR | Status: DC
  Administered 2020-09-19: 100 mg via INTRAVENOUS
  Filled 2020-09-19 (×3): qty 2

## 2020-09-19 MED ORDER — LORAZEPAM 2 MG/ML IJ SOLN: 1.0000 mg | INTRAMUSCULAR | Status: AC | PRN

## 2020-09-19 MED ORDER — K PHOS MONO-SOD PHOS DI & MONO 155-852-130 MG PO TABS
250.0000 mg | ORAL_TABLET | Freq: Two times a day (BID) | ORAL | Status: AC
  Administered 2020-09-19 – 2020-09-21 (×6): 250 mg via ORAL
  Filled 2020-09-19 (×6): qty 1

## 2020-09-19 MED ORDER — FUROSEMIDE 10 MG/ML IJ SOLN
40.0000 mg | Freq: Once | INTRAMUSCULAR | Status: AC
  Administered 2020-09-19: 40 mg via INTRAVENOUS
  Filled 2020-09-19: qty 4

## 2020-09-19 MED ORDER — INSULIN STARTER KIT- PEN NEEDLES (ENGLISH)
1.0000 | Freq: Once | Status: AC
  Administered 2020-09-19: 1
  Filled 2020-09-19: qty 1

## 2020-09-19 MED ORDER — INSULIN ASPART 100 UNIT/ML IJ SOLN
0.0000 [IU] | Freq: Three times a day (TID) | INTRAMUSCULAR | Status: DC
  Administered 2020-09-19 (×2): 4 [IU] via SUBCUTANEOUS
  Administered 2020-09-20: 7 [IU] via SUBCUTANEOUS
  Administered 2020-09-20: 4 [IU] via SUBCUTANEOUS
  Administered 2020-09-20: 11 [IU] via SUBCUTANEOUS
  Administered 2020-09-21: 4 [IU] via SUBCUTANEOUS
  Administered 2020-09-21: 7 [IU] via SUBCUTANEOUS
  Administered 2020-09-22: 4 [IU] via SUBCUTANEOUS
  Administered 2020-09-22: 3 [IU] via SUBCUTANEOUS
  Administered 2020-09-22 – 2020-09-24 (×6): 4 [IU] via SUBCUTANEOUS

## 2020-09-19 MED ORDER — INSULIN GLARGINE 100 UNIT/ML ~~LOC~~ SOLN
18.0000 [IU] | SUBCUTANEOUS | Status: DC
  Administered 2020-09-19: 18 [IU] via SUBCUTANEOUS
  Filled 2020-09-19 (×2): qty 0.18

## 2020-09-19 MED ORDER — LORAZEPAM 1 MG PO TABS
1.0000 mg | ORAL_TABLET | ORAL | Status: AC | PRN
  Administered 2020-09-19 – 2020-09-21 (×2): 1 mg via ORAL
  Filled 2020-09-19 (×2): qty 1

## 2020-09-19 MED ORDER — MAGNESIUM SULFATE 4 GM/100ML IV SOLN
4.0000 g | Freq: Once | INTRAVENOUS | Status: AC
  Administered 2020-09-19: 4 g via INTRAVENOUS
  Filled 2020-09-19: qty 100

## 2020-09-19 NOTE — Progress Notes (Addendum)
PROGRESS NOTE    Bryce Mendoza  GEX:[id number] DOB: [birth year] DOA: 09/17/2020 PCP: Claiborne Rigg, NP (Confirm with patient/family/NH records and if not entered, this HAS to be entered at [facility] point of entry. 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  CT abdomen and pelvis done consistent with acute pancreatitis without pseudocyst or abscess formation, no evidence of necrosis or hemorrhage seen.  Patient noted on admission to have a lipase of 2959, markedly hyperglycemic with a anion gap concerning for DKA.  Patient also noted to have a substantial lactic acidosis of 6.7.  Patient aggressively hydrated with IV fluids, placed on the insulin drip, placed on bowel rest, pain management.   Assessment & Plan:   Principal Problem:   Acute pancreatitis Active Problems:   Idiopathic polyneuropathy   DKA (diabetic ketoacidosis) (HCC)   Mixed hyperlipidemia   Lactic acidosis   Elevated BP without diagnosis of hypertension   #1 acute pancreatitis -?  Etiology. -Patient presented with 2-day history of severe abdominal pain, markedly elevated lipase levels, CT abdomen and pelvis consistent with acute pancreatitis without evidence of abscess formation, necrosis or hemorrhage. -Patient initially denied to admitting physician any alcohol use or illicit drug use however admitted to drinking bottles of wine 2-3 times a week. -Fasting lipid panel with a total cholesterol of 249, LDL of 163, triglycerides of 180. -Right upper quadrant ultrasound done with no gallstones or biliary distention, increased hepatic echogenicity consistent with fatty infiltration and/or hepatocellular  disease. -2 L bolus of normal saline ordered this morning. -Lipase levels trending down and currently at 211 from 1095 from 2959. -Some clinical improvement -Decrease IV fluids to 75 cc an hour as patient now with some lower extremity edema. -Placed on clear liquids. -Pain management, supportive care.  2.  New onset diabetes mellitus/DKA -Patient presenting with no known history of diabetes. -Patient noted to be in DKA with the severe hypoglycemia, anion gap. -Patient currently on glucose stabilizer and currently n.p.o. -Anion gap closed at 10.  Bicarb of 22. -We will transition from insulin drip/glucose stabilizer to subcutaneous Lantus 18 units daily, SSI. -Hemoglobin A1c 9.4. -Diabetes coordinator following.  3.  Hypomagnesemia/hypophosphatemia -Magnesium sulfate 4 g IV x1. -K-Phos to 50 mg p.o. twice daily x3 days. -Repeat labs in the morning.  4.  Lactic acidosis -Patient presented with severe lactic acidosis likely secondary to severe volume depletion. -Lactic acid level trending down with hydration.  5.  Mixed hyperlipidemia -Continue statin.  6.  Idiopathic polyneuropathy -Patient follows an outpatient neurology at [facility]. -Continue home regimen nortriptyline nightly for associated neuropathic pain. -Outpatient follow-up.  7.  Elevated blood pressure without diagnosis of hypertension -Likely secondary to severe pain. -Patient with no known history of hypertension. -BP improved. -IV hydralazine as needed.  8.  Morbid obesity  9.  Leukocytosis -Likely reactive leukocytosis secondary to acute pancreatitis. -Patient afebrile. -Urinalysis nitrite negative, leukocytes negative. -No pulmonary symptoms. -Follow.  10.  Alcohol use -It is noted per admitting physician that patient denied any alcohol use however patient admits to drinking 2 bottles of wine 2 or 3 times a week. -Place on the Ativan withdrawal protocol. -Thiamine, folic acid, multivitamin.  11.  History  of DVT/PE -Continue full dose Lovenox.     DVT prophylaxis: Lovenox Code Status: Full Family Communication: Updated patient.  No family at bedside. Disposition:   Status is: Inpatient  Remains inpatient appropriate because:Inpatient level of care appropriate due to severity of illness  Dispo: The patient is from: Home              Anticipated d/c is to: Home              Patient currently is not medically stable to d/c.   Difficult to place patient No       Consultants:  None  Procedures:  CT abdomen and pelvis 09/17/2020 Right upper quadrant ultrasound 09/18/2020  Antimicrobials: None   Subjective: Sleeping but arousable.  Overall feels abdominal pain has improved since admission.  No nausea or emesis.  No chest pain.  Denies any significant shortness of breath however states his breathing is somewhat shallow.  Objective: Vitals:   09/19/20 0000 09/19/20 0400 09/19/20 0500 09/19/20 0608  BP: (!) 180/105 (!) 142/73 (!) 168/90 (!) 146/70  Pulse: (!) 115 (!) 117 (!) 117 (!) 117  Resp: (!) 31 (!) 24  (!) 29  Temp: 98.7 F (37.1 C) 99 F (37.2 C)    TempSrc: Oral Oral    SpO2: 92% 93% 93% 92%  Weight:      Height:        Intake/Output Summary (Last 24 hours) at 09/19/2020 1006 Last data filed at 09/19/2020 0000 Gross per 24 hour  Intake 1813.14 ml  Output 260 ml  Net 1553.14 ml   Filed Weights   09/17/20 1913 09/18/20 0402  Weight: 122.5 kg 117.9 kg    Examination:  General exam: NAD.  Dry mucous membranes. Respiratory system: Some decreased breath sounds in the bases.  No wheezing, no crackles, no rhonchi.  Normal respiratory effort.  Speaking in full sentences.  Cardiovascular system: S1 & S2 heard, RRR. No JVD, murmurs, rubs, gallops or clicks.  1+ bilateral lower extremity edema. Gastrointestinal system: Abdomen is obese, mildly distended, soft, decreased tenderness to palpation in the epigastric region, positive bowel sounds.  No rebound.  No  guarding. Central nervous system: Alert and oriented. No focal neurological deficits. Extremities: Symmetric 5 x 5 power. Skin: No rashes, lesions or ulcers Psychiatry: Judgement and insight appear normal. Mood & affect appropriate.     Data Reviewed: I have personally reviewed following labs and imaging studies  CBC: Recent Labs  Lab 09/17/20 1918 09/17/20 2203 09/19/20 0251  WBC 22.4*  --  24.8*  HGB 14.9 15.6 15.4  HCT 41.4 46.0 42.4  MCV 104.3*  --  104.4*  PLT 171  --  79*    Basic Metabolic Panel: Recent Labs  Lab 09/18/20 0717 09/18/20 1243 09/18/20 1651 09/18/20 1957 09/19/20 0813  NA 129* 130* 131* 129* 132*  K 4.0 3.8 3.6 3.6 3.9  CL 96* 96* 98 98 100  CO2 19* 19* 21* 22 22  GLUCOSE 146* 134* 117* 121* 169*  BUN 12 10 11 11 12   CREATININE 0.76 0.48* 0.65 0.73 0.75  CALCIUM 9.1 8.7* 8.7* 8.4* 8.0*  MG  --   --   --   --  1.3*  PHOS  --   --   --   --  2.1*    GFR: Estimated Creatinine Clearance: 135.4 mL/min (by C-G formula based on SCr of 0.75 mg/dL).  Liver Function Tests: Recent Labs  Lab 09/17/20 1918 09/19/20 0813  AST  39 40  ALT 50* 26  ALKPHOS 80 54  BILITOT 0.7 2.4*  PROT 7.4 5.4*  ALBUMIN 4.1 2.6*    CBG: Recent Labs  Lab 09/19/20 0521 09/19/20 0616 09/19/20 0728 09/19/20 0837 09/19/20 0945  GLUCAP 226* 140* 191* 192* 169*     Recent Results (from the past 240 hour(s))  Resp Panel by RT-PCR (Flu A&B, Covid) Nasopharyngeal Swab     Status: None   Collection Time: 09/17/20  9:50 PM   Specimen: Nasopharyngeal Swab; Nasopharyngeal(NP) swabs in vial transport medium  Result Value Ref Range Status   SARS Coronavirus 2 by RT PCR NEGATIVE NEGATIVE Final    Comment: (NOTE) SARS-CoV-2 target nucleic acids are NOT DETECTED.  The SARS-CoV-2 RNA is generally detectable in upper respiratory specimens during the acute phase of infection. The lowest concentration of SARS-CoV-2 viral copies this assay can detect is 138 copies/mL. A  negative result does not preclude SARS-Cov-2 infection and should not be used as the sole basis for treatment or other patient management decisions. A negative result may occur with  improper specimen collection/handling, submission of specimen other than nasopharyngeal swab, presence of viral mutation(s) within the areas targeted by this assay, and inadequate number of viral copies(<138 copies/mL). A negative result must be combined with clinical observations, patient history, and epidemiological information. The expected result is Negative.  Fact Sheet for Patients:  BloggerCourse.comhttps://www.fda.gov/media/152166/download  Fact Sheet for Healthcare Providers:  SeriousBroker.ithttps://www.fda.gov/media/152162/download  This test is no t yet approved or cleared by the [place] FDA and  has been authorized for detection and/or diagnosis of SARS-CoV-2 by FDA under an Emergency Use Authorization (EUA). This EUA will remain  in effect (meaning this test can be used) for the duration of the COVID-19 declaration under Section 564(b)(1) of the Act, 21 U.S.C.section 360bbb-3(b)(1), unless the authorization is terminated  or revoked sooner.       Influenza A by PCR NEGATIVE NEGATIVE Final   Influenza B by PCR NEGATIVE NEGATIVE Final    Comment: (NOTE) The Xpert Xpress SARS-CoV-2/FLU/RSV plus assay is intended as an aid in the diagnosis of influenza from Nasopharyngeal swab specimens and should not be used as a sole basis for treatment. Nasal washings and aspirates are unacceptable for Xpert Xpress SARS-CoV-2/FLU/RSV testing.  Fact Sheet for Patients: BloggerCourse.comhttps://www.fda.gov/media/152166/download  Fact Sheet for Healthcare Providers: SeriousBroker.ithttps://www.fda.gov/media/152162/download  This test is not yet approved or cleared by the [place] FDA and has been authorized for detection and/or diagnosis of SARS-CoV-2 by FDA under an Emergency Use Authorization (EUA). This EUA will remain in effect (meaning this test can  be used) for the duration of the COVID-19 declaration under Section 564(b)(1) of the Act, 21 U.S.C. section 360bbb-3(b)(1), unless the authorization is terminated or revoked.  Performed at [facility], [address], [place], [place] [address]   MRSA Next Gen by PCR, Nasal     Status: Abnormal   Collection Time: 09/18/20  3:33 AM   Specimen: Nasal Mucosa; Nasal Swab  Result Value Ref Range Status   MRSA by PCR Next Gen DETECTED (A) NOT DETECTED Final    Comment: RESULT CALLED TO, READ BACK BY AND VERIFIED WITH: PAULINE, RN @ [phone] ON 09/18/20 C VARNER (NOTE) The GeneXpert MRSA Assay (FDA approved for NASAL specimens only), is one component of a comprehensive MRSA colonization surveillance program. It is not intended to diagnose MRSA infection nor to guide or monitor treatment for MRSA infections. Test performance is not FDA approved in patients less than 2  years old. Performed at [facility], [address], [place], [place] [address]          Radiology Studies: CT ABDOMEN PELVIS W CONTRAST  Result Date: 09/17/2020 CLINICAL DATA:  Abdomen pain EXAM: CT ABDOMEN AND PELVIS WITH CONTRAST TECHNIQUE: Multidetector CT imaging of the abdomen and pelvis was performed using the standard protocol following bolus administration of intravenous contrast. CONTRAST:  OMNIPAQUE IOHEXOL 300 MG/ML  SOLN COMPARISON:  Ultrasound 06/15/2019, CT chest 05/16/2019, 10/15/2017 FINDINGS: Lower chest: Lung bases demonstrate bandlike densities in the lingula and left base felt consistent with pulmonary scarring. No acute airspace disease or pleural effusion Hepatobiliary: Hepatic steatosis. No calcified gallstone or biliary dilatation Pancreas: Moderate to marked peripancreatic inflammation consistent with acute pancreatitis. Homogeneous glandular enhancement. No organized fluid collection Spleen: Normal in size without focal abnormality. Adrenals/Urinary Tract:  Adrenal glands are unremarkable. Kidneys are normal, without renal calculi, focal lesion, or hydronephrosis. Bladder is unremarkable. Stomach/Bowel: Stomach nonenlarged. Wall thickening of the second and third portion of duodenum. No dilated small bowel. No colon wall thickening Vascular/Lymphatic: Moderate aortic atherosclerosis. No aneurysm. No suspicious nodes. Patent portal and splenic veins. Reproductive: Prostate is unremarkable. Other: No free air. Fluid within the left greater than right pararenal spaces. Musculoskeletal: No acute or significant osseous findings. IMPRESSION: 1. Findings consistent with acute pancreatitis. No organized fluid collections at this time. 2. Slight wall thickening of duodenum, either reactive or due to duodenitis 3. Hepatic steatosis Electronically Signed   By: Jasmine Pang M.D.   On: 09/17/2020 22:22   US Abdomen Limited RUQ (LIVER/GB)  Result Date: 09/18/2020 CLINICAL DATA:  Acute pancreatitis. EXAM: ULTRASOUND ABDOMEN LIMITED RIGHT UPPER QUADRANT COMPARISON:  CT 09/17/2020. FINDINGS: Gallbladder: No gallstones or wall thickening visualized. No sonographic Murphy sign noted by sonographer. Common bile duct: Diameter: 6 mm Liver: Increased echogenicity consistent fatty infiltration or hepatocellular disease. Portal vein is patent on color Doppler imaging with normal direction of blood flow towards the liver. Other: Suboptimal exam due to patient's body habitus. IMPRESSION: 1.  No gallstones or biliary distention. 2. Increased hepatic echogenicity consistent fatty infiltration and/or hepatocellular disease. Electronically Signed   By: Maisie Fus  Register   On: 09/18/2020 07:55        Scheduled Meds:  Chlorhexidine Gluconate Cloth  6 each Topical Daily   enoxaparin (LOVENOX) injection  120 mg Subcutaneous Q12H   folic acid  1 mg Oral Daily   multivitamin with minerals  1 tablet Oral Daily   mupirocin ointment  1 application Nasal BID   nortriptyline  10 mg Oral Daily    nortriptyline  20 mg Oral QHS   pantoprazole (PROTONIX) IV  40 mg Intravenous Daily   phosphorus  250 mg Oral BID   thiamine  100 mg Oral Daily   Or   thiamine  100 mg Intravenous Daily   Continuous Infusions:  dextrose 5% lactated ringers 125 mL/hr at 09/19/20 0609   insulin 4.8 Units/hr (09/19/20 0429)   lactated ringers     magnesium sulfate bolus IVPB       LOS: 1 day    Time spent: 40 minutes    Ramiro Harvest, MD [facility]   To contact the attending provider between 7A-7P or the covering provider during after hours 7P-7A, please log into the web site [email or web address] and access using universal [place] password for that web site. If you do not have the password, please call the hospital operator.  09/19/2020, 10:06 AM

## 2020-09-19 NOTE — Progress Notes (Signed)
Inpatient Diabetes Program Recommendations  AACE/ADA: New Consensus Statement on Inpatient Glycemic Control (2015)  Target Ranges:  Prepandial:   less than 140 mg/dL      Peak postprandial:   less than 180 mg/dL (1-2 hours)      Critically ill patients:  140 - 180 mg/dL   Lab Results  Component Value Date   GLUCAP 169 (H) 09/19/2020   HGBA1C 9.4 (H) 09/18/2020    Review of Glycemic Control  Diabetes history: New-onset Outpatient Diabetes medications: None Current orders for Inpatient glycemic control: Lantus 18 units QD, Novolog 0-20 units TID with meals and 0-5 HS  HgbA1C - 9.4%  Inpatient Diabetes Program Recommendations:    Educated patient on insulin pen use at home. Reviewed contents of insulin flexpen starter kit. Reviewed all steps if insulin pen including attachment of needle, 2-unit air shot, dialing up dose, giving injection, removing needle, disposal of sharps, storage of unused insulin, disposal of insulin etc. Patient able to provide successful return demonstration. Also reviewed troubleshooting with insulin pen. MD to give patient Rxs for insulin pens and insulin pen needles.   Reviewed diet, importance of exercise, monitoring, and hypoglycemia s/s and treatment. Pt seems motivated to control his blood sugars. Will f/u after discharge with PCP.  Will need prescriptions for:  Lantus solostar pen Novolog flexpen Insulin pen needles Blood glucose monitoring kit  Will f/u in am.  Thank you. Lorenda Peck, RD, LDN, CDE Inpatient Diabetes Coordinator [phone]

## 2020-09-19 NOTE — Plan of Care (Signed)
  Problem: Education: Goal: Knowledge of General Education information will improve Description: Including pain rating scale, medication(s)/side effects and non-pharmacologic comfort measures Outcome: Progressing   Problem: Health Behavior/Discharge Planning: Goal: Ability to manage health-related needs will improve Outcome: Progressing   Problem: Clinical Measurements: Goal: Ability to maintain clinical measurements within normal limits will improve Outcome: Progressing Goal: Will remain free from infection Outcome: Progressing Goal: Diagnostic test results will improve Outcome: Progressing Goal: Respiratory complications will improve Outcome: Progressing Goal: Cardiovascular complication will be avoided Outcome: Progressing   Problem: Activity: Goal: Risk for activity intolerance will decrease Outcome: Progressing   Problem: Nutrition: Goal: Adequate nutrition will be maintained Outcome: Progressing   Problem: Coping: Goal: Level of anxiety will decrease Outcome: Progressing   Problem: Elimination: Goal: Will not experience complications related to bowel motility Outcome: Progressing Goal: Will not experience complications related to urinary retention Outcome: Progressing   Problem: Pain Managment: Goal: General experience of comfort will improve Outcome: Progressing   Problem: Safety: Goal: Ability to remain free from injury will improve Outcome: Progressing   Problem: Skin Integrity: Goal: Risk for impaired skin integrity will decrease Outcome: Progressing   Problem: Education: Goal: Ability to describe self-care measures that may prevent or decrease complications (Diabetes Survival Skills Education) will improve Outcome: Progressing Goal: Individualized Educational Video(s) Outcome: Progressing   Problem: Cardiac: Goal: Ability to maintain an adequate cardiac output will improve Outcome: Progressing   Problem: Health Behavior/Discharge  Planning: Goal: Ability to identify and utilize available resources and services will improve Outcome: Progressing Goal: Ability to manage health-related needs will improve Outcome: Progressing   Problem: Fluid Volume: Goal: Ability to achieve a balanced intake and output will improve Outcome: Progressing   Problem: Metabolic: Goal: Ability to maintain appropriate glucose levels will improve Outcome: Progressing   Problem: Nutritional: Goal: Maintenance of adequate weight for body size and type will improve Outcome: Progressing   Problem: Respiratory: Goal: Will regain and/or maintain adequate ventilation Outcome: Progressing   Problem: Urinary Elimination: Goal: Ability to achieve and maintain adequate renal perfusion and functioning will improve Outcome: Progressing

## 2020-09-19 NOTE — TOC Initial Note (Signed)
Transition of Care [facility]) - Initial/Assessment Note    Patient Details  Name: Bryce Mendoza MRN: [id number] Date of Birth: Mar 12, 1960  Transition of Care [facility]) CM/SW Contact:    Golda Acre, RN Phone Number: 09/19/2020, 8:20 AM  Clinical Narrative:                 60 year old male with past medical history of DVT and PE (2019) on Eliquis, hyperlipidemia and idiopathic severe polyneuropathy who presents to [facility] long hospital intensive care unit as a transfer from [facility] for epigastric pain and concerns for pancreatitis.  Patient explains that on Sunday evening he suddenly began to experience abdominal pain.  This abdominal pain was initially epigastric in location, sharp to dull in quality and severe in intensity.  Pain began to radiate throughout the abdomen and radiate to the back.  Patient explains the pain is worse with movement or ambulation and worse with any attempted ingestion of solids or liquids.  Patient states that he has not recently been placed on any new medications.  Patient denies illicit drug use.  Patient denies alcohol use.  Patient's pain continued to persist over the following 24 hours.  Patient denies any associated dysuria, diarrhea, fevers, vomiting, sick contacts, recent travel or contact with confirmed COVID-19 infection.  Patient's pain continued to persist and worsen into the patient eventually presented to [facility] emergency department for evaluation.   Upon evaluation there CT imaging the abdomen pelvis revealed findings consistent with acute pancreatitis without pseudocyst or abscess formation.  No evidence of necrosis or hemorrhage was seen.  Patient was found to have a markedly elevated lipase of 2959.  Patient additionally was found to be markedly hyperglycemic with a widened anion gap concerning for developing diabetic ketoacidosis.  Patient has no known history of diabetes.  Furthermore, patient was found to have a substantial  lactic acidosis of 6.7.  Patient initiated on aggressive intravenous volume resuscitation.  Patient was placed on a insulin infusion.  Patient was made NPO.  The hospitalist group was then called and the patient was excepted as a transfer to [facility] long hospital for continued care. TOC PLAN OF CARE: Will follow for any toc needs,following for progression of condition. Expected Discharge Plan: Home/Self Care Barriers to Discharge: Continued Medical Work up  Patient Goals and CMS Choice        Expected Discharge Plan and Services Expected Discharge Plan: Home/Self Care       Living arrangements for the past 2 months: Single Family Home                                      Prior Living Arrangements/Services Living arrangements for the past 2 months: Single Family Home Lives with:: Self Patient language and need for interpreter reviewed:: Yes              Criminal Activity/Legal Involvement Pertinent to Current Situation/Hospitalization: No - Comment as needed  Activities of Daily Living Home Assistive Devices/Equipment: Environmental consultant (specify type), Wheelchair (pulse oximeter) ADL Screening (condition at time of admission) Patient's cognitive ability adequate to safely complete daily activities?: No Is the patient deaf or have difficulty hearing?: No Does the patient have difficulty seeing, even when wearing glasses/contacts?: Yes (blind in right eye, poor vision in left eye) Does the patient have difficulty concentrating, remembering, or making decisions?: Yes (for 3 days due to pain) Patient able to  express need for assistance with ADLs?: Yes Does the patient have difficulty dressing or bathing?: Yes Independently performs ADLs?: No Communication: Independent Dressing (OT): Needs assistance Is this a change from baseline?: Change from baseline, expected to last >3 days Grooming: Independent Feeding: Independent Bathing: Needs assistance Is this a change from baseline?:  Change from baseline, expected to last >3 days Toileting: Needs assistance Is this a change from baseline?: Change from baseline, expected to last >3days In/Out Bed: Needs assistance Is this a change from baseline?: Change from baseline, expected to last >3 days Walks in Home: Needs assistance Is this a change from baseline?: Change from baseline, expected to last >3 days Does the patient have difficulty walking or climbing stairs?: Yes Weakness of Legs: Both Weakness of Arms/Hands: Both  Permission Sought/Granted                  Emotional Assessment Appearance:: Appears stated age     Orientation: : Oriented to Situation, Oriented to  Time, Oriented to Self, Oriented to Place Alcohol / Substance Use: Alcohol Use Psych Involvement: No (comment)  Admission diagnosis:  Acute pancreatitis [K85.90] Dehydration [E86.0] Hyperglycemia [R73.9] Acute pancreatitis without infection or necrosis, unspecified pancreatitis type [K85.90] Patient Active Problem List   Diagnosis Date Noted   DKA (diabetic ketoacidosis) (HCC) 09/18/2020   Mixed hyperlipidemia 09/18/2020   Lactic acidosis 09/18/2020   Elevated BP without diagnosis of hypertension 09/18/2020   Acute pancreatitis 09/17/2020   Idiopathic polyneuropathy 05/09/2020   Gait abnormality 01/10/2020   CIDP (chronic inflammatory demyelinating polyneuropathy) (HCC) 11/30/2019   Ascending paralysis (HCC) 06/15/2019   Hyperglycemia 06/15/2019   Bilirubinuria 06/15/2019   Dupuytren contracture 06/15/2019   PCP:  Claiborne Rigg, NP Pharmacy:   [id number] - [place], [place] - [address] [place] [address] Phone: [phone] Fax: [phone]     Social Determinants of Health (SDOH) Interventions    Readmission Risk Interventions No flowsheet data found.

## 2020-09-20 DIAGNOSIS — G609 Hereditary and idiopathic neuropathy, unspecified: Secondary | ICD-10-CM | POA: Diagnosis not present

## 2020-09-20 DIAGNOSIS — R030 Elevated blood-pressure reading, without diagnosis of hypertension: Secondary | ICD-10-CM | POA: Diagnosis not present

## 2020-09-20 DIAGNOSIS — K8500 Idiopathic acute pancreatitis without necrosis or infection: Secondary | ICD-10-CM | POA: Diagnosis not present

## 2020-09-20 DIAGNOSIS — E0810 Diabetes mellitus due to underlying condition with ketoacidosis without coma: Secondary | ICD-10-CM | POA: Diagnosis not present

## 2020-09-20 LAB — BASIC METABOLIC PANEL
Anion gap: 13 (ref 5–15)
BUN: 17 mg/dL (ref 6–20)
CO2: 20 mmol/L — ABNORMAL LOW (ref 22–32)
Calcium: 7.9 mg/dL — ABNORMAL LOW (ref 8.9–10.3)
Chloride: 99 mmol/L (ref 98–111)
Creatinine, Ser: 0.92 mg/dL (ref 0.61–1.24)
GFR, Estimated: >60 mL/min (ref >60)
Glucose, Bld: 247 mg/dL — ABNORMAL HIGH (ref 70–99)
Potassium: 3.7 mmol/L (ref 3.5–5.1)
Sodium: 132 mmol/L — ABNORMAL LOW (ref 135–145)

## 2020-09-20 LAB — CBC WITH DIFFERENTIAL/PLATELET
Abs Immature Granulocytes: 0.08 10*3/uL — ABNORMAL HIGH (ref 0.00–0.07)
Basophils Absolute: 0.1 10*3/uL (ref 0.0–0.1)
Basophils Relative: 0 %
Eosinophils Absolute: 0 10*3/uL (ref 0.0–0.5)
Eosinophils Relative: 0 %
HCT: 42.7 % (ref 39.0–52.0)
Hemoglobin: 14.6 g/dL (ref 13.0–17.0)
Immature Granulocytes: 0 %
Lymphocytes Relative: 8 %
Lymphs Abs: 1.7 10*3/uL (ref 0.7–4.0)
MCH: 37.2 pg — ABNORMAL HIGH (ref 26.0–34.0)
MCHC: 34.2 g/dL (ref 30.0–36.0)
MCV: 108.9 fL — ABNORMAL HIGH (ref 80.0–100.0)
Monocytes Absolute: 0.8 10*3/uL (ref 0.1–1.0)
Monocytes Relative: 4 %
Neutro Abs: 18.4 10*3/uL — ABNORMAL HIGH (ref 1.7–7.7)
Neutrophils Relative %: 88 %
Platelets: 82 10*3/uL — ABNORMAL LOW (ref 150–400)
RBC: 3.92 MIL/uL — ABNORMAL LOW (ref 4.22–5.81)
RDW: 12.7 % (ref 11.5–15.5)
WBC: 21.1 10*3/uL — ABNORMAL HIGH (ref 4.0–10.5)
nRBC: 0 % (ref 0.0–0.2)

## 2020-09-20 LAB — GLUCOSE, CAPILLARY
Glucose-Capillary: 268 mg/dL — ABNORMAL HIGH (ref 70–99)
Glucose-Capillary: 236 mg/dL — ABNORMAL HIGH (ref 70–99)
Glucose-Capillary: 187 mg/dL — ABNORMAL HIGH (ref 70–99)
Glucose-Capillary: 185 mg/dL — ABNORMAL HIGH (ref 70–99)

## 2020-09-20 LAB — PROCALCITONIN: Procalcitonin: 0.79 ng/mL

## 2020-09-20 LAB — MAGNESIUM: Magnesium: 2.2 mg/dL (ref 1.7–2.4)

## 2020-09-20 MED ORDER — SODIUM CHLORIDE 0.9 % IV SOLN: INTRAVENOUS | Status: DC

## 2020-09-20 MED ORDER — INSULIN GLARGINE 100 UNIT/ML ~~LOC~~ SOLN
22.0000 [IU] | SUBCUTANEOUS | Status: DC
  Administered 2020-09-20: 22 [IU] via SUBCUTANEOUS
  Filled 2020-09-20 (×2): qty 0.22

## 2020-09-20 MED ORDER — SODIUM CHLORIDE 0.9 % IV BOLUS
500.0000 mL | Freq: Once | INTRAVENOUS | Status: AC
  Administered 2020-09-20: 500 mL via INTRAVENOUS

## 2020-09-20 NOTE — Progress Notes (Signed)
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  CT abdomen and pelvis done consistent with acute pancreatitis without pseudocyst or abscess formation, no evidence of necrosis or hemorrhage seen.  Patient noted on admission to have a lipase of 2959, markedly hyperglycemic with a anion gap concerning for DKA.  Patient also noted to have a substantial lactic acidosis of 6.7.  Patient aggressively hydrated with IV fluids, placed on the insulin drip, placed on bowel rest, pain management.   Assessment & Plan:   Principal Problem:   Acute pancreatitis Active Problems:   Idiopathic polyneuropathy   DKA (diabetic ketoacidosis) (HCC)   Mixed hyperlipidemia   Lactic acidosis   Elevated BP without diagnosis of hypertension   Alcohol dependence with unspecified alcohol-induced disorder (HCC)   Hypomagnesemia   Hypophosphatemia   1 acute pancreatitis -?  Etiology. -Patient presented with 2-day history of severe abdominal pain, markedly elevated lipase levels, CT abdomen and pelvis consistent with acute pancreatitis without evidence of abscess formation, necrosis or hemorrhage. -Patient initially denied to admitting physician any alcohol use or illicit drug use however admitted to drinking bottles of wine 2-3 times a week. -Fasting lipid panel with a total cholesterol of 249, LDL of 163, triglycerides of 180. -Right upper quadrant ultrasound done with no gallstones or biliary distention, increased hepatic echogenicity consistent with fatty infiltration and/or hepatocellular disease. - improving  clinically slowly.   -Lipase levels trending down at 211 from 1095 from 2959.   -IV fluids with saline lock.   -Patient clinically dry on examination with improvement with lower extremity edema.   -Normal saline 500 cc bolus x1.   -Normal saline 100 cc an hour.   -Continue clear liquids for another 24 hours and if continued improvement could advance to a full liquid diet tomorrow.   -Pain management.   -Supportive care.    2.  New onset diabetes mellitus/DKA -Patient presenting with no known history of diabetes. -Patient noted to be in DKA with the severe hypoglycemia, anion gap. -Hemoglobin A1c 9.4.   -Patient has been transitioned off glucose stabilizer/insulin drip and currently on subcutaneous Lantus.   -CBG 268 this morning.   -Increase Lantus to 22 units daily.   -SSI.   -Diabetes coordinator following.   3.  Hypomagnesemia/hypophosphatemia -Magnesium at 2.2 this morning.   -Continue K-Phos 250mg  twice daily x2 days.   -Repeat labs in the morning.   4.  Lactic acidosis -Patient presented with severe lactic acidosis likely secondary to severe volume depletion. -Lactic acid level trending down with hydration.  5.  Mixed hyperlipidemia -Statin.    6.  Idiopathic polyneuropathy -Patient follows an outpatient neurology at [facility]. -Continue home regimen nortriptyline nightly for associated neuropathic pain. -Outpatient follow-up.  7.  Elevated blood pressure without diagnosis of hypertension -Secondary to severe pain.   -Improved.   -Patient with no history of hypertension.   -IV hydralazine as needed.   8.  Morbid obesity  9.  Leukocytosis -Likely reactive leukocytosis secondary to acute pancreatitis. -Patient afebrile.   -Urinalysis nitrite negative, leukocytes negative.   -No respiratory symptoms.   -Leukocytosis slowly trending  down.   -Follow.    10.  Alcohol use -It is noted per admitting physician that patient denied any alcohol use however patient admits to  drinking 2 bottles of wine 2 or 3 times a week. -Patient with no signs of withdrawal. -Continue Ativan withdrawal protocol, thiamine, folic acid, multivitamin.    11.  History of DVT/PE -Continue full dose Lovenox and likely transition back to Eliquis in the next 24 hours if continued clinical improvement and tolerating oral intake.      DVT prophylaxis: Lovenox Code Status: Full Family Communication: Updated patient.  No family at bedside. Disposition:   Status is: Inpatient  Remains inpatient appropriate because:Inpatient level of care appropriate due to severity of illness  Dispo: The patient is from: Home              Anticipated d/c is to: Home              Patient currently is not medically stable to d/c.   Difficult to place patient No       Consultants:  None  Procedures:  CT abdomen and pelvis 09/17/2020 Right upper quadrant ultrasound 09/18/2020  Antimicrobials: None   Subjective: Laying in bed.  Overall feels epigastric abdominal pain improved from admission.  Still with some upper abdominal pain.  Tolerating clears and states had a little bit of pain with clears but quickly subsided.  No nausea or emesis.  No shortness of breath.  No chest pain.    Objective: Vitals:   09/20/20 0339 09/20/20 0700 09/20/20 0800 09/20/20 0900  BP:  (!) 158/103 135/77   Pulse:   (!) 102 (!) 103  Resp:  (!) 21 (!) 29 (!) 23  Temp: 99.2 F (37.3 C)  98.6 F (37 C)   TempSrc: Oral  Oral   SpO2:   99% (!) 87%  Weight:      Height:        Intake/Output Summary (Last 24 hours) at 09/20/2020 1029 Last data filed at 09/20/2020 0837 Gross per 24 hour  Intake 1283.92 ml  Output 450 ml  Net 833.92 ml    Filed Weights   09/17/20 1913 09/18/20 0402  Weight: 122.5 kg 117.9 kg    Examination:  General exam: NAD.  Dry mucous membranes. Respiratory system: Decreased breath sounds in the bases otherwise clear.  No wheezes, no crackles, no rhonchi.  Normal respiratory  effort.  Speaking in full sentences.  Cardiovascular system: Regular rate rhythm no murmurs rubs or gallops.  No JVD.  Trace bilateral lower extremity edema  Gastrointestinal system: Abdomen is obese, mildly distended, soft, decreased tenderness to palpation in the epigastrium.  Positive bowel sounds.  No rebound.  No guarding. Central nervous system: Alert and oriented.  Moving extremities spontaneously.  No focal neurological deficits.   Extremities: Symmetric 5 x 5 power. Skin: No rashes, lesions or ulcers Psychiatry: Judgement and insight appear normal. Mood & affect appropriate.     Data Reviewed: I have personally reviewed following labs and imaging studies  CBC: Recent Labs  Lab 09/17/20 1918 09/17/20 2203 09/19/20 0251 09/20/20 0238  WBC 22.4*  --  24.8* 21.1*  NEUTROABS  --   --   --  18.4*  HGB 14.9 15.6 15.4 14.6  HCT 41.4 46.0 42.4 42.7  MCV 104.3*  --  104.4* 108.9*  PLT 171  --  79* 82*     Basic Metabolic Panel: Recent Labs  Lab 09/18/20 1243 09/18/20 1651 09/18/20 1957 09/19/20 0813  09/20/20 0238  NA 130* 131* 129* 132* 132*  K 3.8 3.6 3.6 3.9 3.7  CL 96* 98 98 100 99  CO2 19* 21* 22 22 20*  GLUCOSE 134* 117* 121* 169* 247*  BUN 10 11 11 12 17   CREATININE 0.48* 0.65 0.73 0.75 0.92  CALCIUM 8.7* 8.7* 8.4* 8.0* 7.9*  MG  --   --   --  1.3* 2.2  PHOS  --   --   --  2.1*  --      GFR: Estimated Creatinine Clearance: 117.7 mL/min (by C-G formula based on SCr of 0.92 mg/dL).  Liver Function Tests: Recent Labs  Lab 09/17/20 1918 09/19/20 0813  AST 39 40  ALT 50* 26  ALKPHOS 80 54  BILITOT 0.7 2.4*  PROT 7.4 5.4*  ALBUMIN 4.1 2.6*     CBG: Recent Labs  Lab 09/19/20 1054 09/19/20 1157 09/19/20 1240 09/19/20 2132 09/20/20 0738  GLUCAP 179* 189* 169* 227* 268*      Recent Results (from the past 240 hour(s))  Resp Panel by RT-PCR (Flu A&B, Covid) Nasopharyngeal Swab     Status: None   Collection Time: 09/17/20  9:50 PM   Specimen:  Nasopharyngeal Swab; Nasopharyngeal(NP) swabs in vial transport medium  Result Value Ref Range Status   SARS Coronavirus 2 by RT PCR NEGATIVE NEGATIVE Final    Comment: (NOTE) SARS-CoV-2 target nucleic acids are NOT DETECTED.  The SARS-CoV-2 RNA is generally detectable in upper respiratory specimens during the acute phase of infection. The lowest concentration of SARS-CoV-2 viral copies this assay can detect is 138 copies/mL. A negative result does not preclude SARS-Cov-2 infection and should not be used as the sole basis for treatment or other patient management decisions. A negative result may occur with  improper specimen collection/handling, submission of specimen other than nasopharyngeal swab, presence of viral mutation(s) within the areas targeted by this assay, and inadequate number of viral copies(<138 copies/mL). A negative result must be combined with clinical observations, patient history, and epidemiological information. The expected result is Negative.  Fact Sheet for Patients:  BloggerCourse.comhttps://www.fda.gov/media/152166/download  Fact Sheet for Healthcare Providers:  SeriousBroker.ithttps://www.fda.gov/media/152162/download  This test is no t yet approved or cleared by the [place] FDA and  has been authorized for detection and/or diagnosis of SARS-CoV-2 by FDA under an Emergency Use Authorization (EUA). This EUA will remain  in effect (meaning this test can be used) for the duration of the COVID-19 declaration under Section 564(b)(1) of the Act, 21 U.S.C.section 360bbb-3(b)(1), unless the authorization is terminated  or revoked sooner.       Influenza A by PCR NEGATIVE NEGATIVE Final   Influenza B by PCR NEGATIVE NEGATIVE Final    Comment: (NOTE) The Xpert Xpress SARS-CoV-2/FLU/RSV plus assay is intended as an aid in the diagnosis of influenza from Nasopharyngeal swab specimens and should not be used as a sole basis for treatment. Nasal washings and aspirates are unacceptable for  Xpert Xpress SARS-CoV-2/FLU/RSV testing.  Fact Sheet for Patients: BloggerCourse.comhttps://www.fda.gov/media/152166/download  Fact Sheet for Healthcare Providers: SeriousBroker.ithttps://www.fda.gov/media/152162/download  This test is not yet approved or cleared by the [place] FDA and has been authorized for detection and/or diagnosis of SARS-CoV-2 by FDA under an Emergency Use Authorization (EUA). This EUA will remain in effect (meaning this test can be used) for the duration of the COVID-19 declaration under Section 564(b)(1) of the Act, 21 U.S.C. section 360bbb-3(b)(1), unless the authorization is terminated or revoked.  Performed at [facility], [phone] [facility]  [place], [place], [place] [address]   MRSA Next Gen by PCR, Nasal     Status: Abnormal   Collection Time: 09/18/20  3:33 AM   Specimen: Nasal Mucosa; Nasal Swab  Result Value Ref Range Status   MRSA by PCR Next Gen DETECTED (A) NOT DETECTED Final    Comment: RESULT CALLED TO, READ BACK BY AND VERIFIED WITH: PAULINE, RN @ [phone] ON 09/18/20 C VARNER (NOTE) The GeneXpert MRSA Assay (FDA approved for NASAL specimens only), is one component of a comprehensive MRSA colonization surveillance program. It is not intended to diagnose MRSA infection nor to guide or monitor treatment for MRSA infections. Test performance is not FDA approved in patients less than 34 years old. Performed at [facility], [address], [place], [place] [address]           Radiology Studies: No results found.      Scheduled Meds:  Chlorhexidine Gluconate Cloth  6 each Topical Daily   enoxaparin (LOVENOX) injection  120 mg Subcutaneous Q12H   folic acid  1 mg Oral Daily   insulin aspart  0-20 Units Subcutaneous TID WC   insulin aspart  0-5 Units Subcutaneous QHS   insulin glargine  22 Units Subcutaneous Q24H   multivitamin with minerals  1 tablet Oral Daily   mupirocin ointment  1 application Nasal BID   nortriptyline  10 mg  Oral Daily   nortriptyline  20 mg Oral QHS   pantoprazole (PROTONIX) IV  40 mg Intravenous Daily   phosphorus  250 mg Oral BID   thiamine  100 mg Oral Daily   Or   thiamine  100 mg Intravenous Daily   Continuous Infusions:     LOS: 2 days    Time spent: 40 minutes    Ramiro Harvest, MD [facility]   To contact the attending provider between 7A-7P or the covering provider during after hours 7P-7A, please log into the web site [email or web address] and access using universal [place] password for that web site. If you do not have the password, please call the hospital operator.  09/20/2020, 10:29 AM

## 2020-09-21 DIAGNOSIS — K5900 Constipation, unspecified: Secondary | ICD-10-CM

## 2020-09-21 DIAGNOSIS — R030 Elevated blood-pressure reading, without diagnosis of hypertension: Secondary | ICD-10-CM | POA: Diagnosis not present

## 2020-09-21 DIAGNOSIS — G609 Hereditary and idiopathic neuropathy, unspecified: Secondary | ICD-10-CM | POA: Diagnosis not present

## 2020-09-21 DIAGNOSIS — K8500 Idiopathic acute pancreatitis without necrosis or infection: Secondary | ICD-10-CM | POA: Diagnosis not present

## 2020-09-21 DIAGNOSIS — E0810 Diabetes mellitus due to underlying condition with ketoacidosis without coma: Secondary | ICD-10-CM | POA: Diagnosis not present

## 2020-09-21 LAB — RENAL FUNCTION PANEL
Albumin: 2.1 g/dL — ABNORMAL LOW (ref 3.5–5.0)
Anion gap: 10 (ref 5–15)
BUN: 20 mg/dL (ref 6–20)
CO2: 20 mmol/L — ABNORMAL LOW (ref 22–32)
Calcium: 7.3 mg/dL — ABNORMAL LOW (ref 8.9–10.3)
Chloride: 101 mmol/L (ref 98–111)
Creatinine, Ser: 0.73 mg/dL (ref 0.61–1.24)
GFR, Estimated: >60 mL/min (ref >60)
Glucose, Bld: 203 mg/dL — ABNORMAL HIGH (ref 70–99)
Phosphorus: 2.3 mg/dL — ABNORMAL LOW (ref 2.5–4.6)
Potassium: 3.1 mmol/L — ABNORMAL LOW (ref 3.5–5.1)
Sodium: 131 mmol/L — ABNORMAL LOW (ref 135–145)

## 2020-09-21 LAB — CBC WITH DIFFERENTIAL/PLATELET
Abs Immature Granulocytes: 0.12 10*3/uL — ABNORMAL HIGH (ref 0.00–0.07)
Basophils Absolute: 0 10*3/uL (ref 0.0–0.1)
Basophils Relative: 0 %
Eosinophils Absolute: 0.1 10*3/uL (ref 0.0–0.5)
Eosinophils Relative: 1 %
HCT: 37.5 % — ABNORMAL LOW (ref 39.0–52.0)
Hemoglobin: 13 g/dL (ref 13.0–17.0)
Immature Granulocytes: 1 %
Lymphocytes Relative: 16 %
Lymphs Abs: 1.8 10*3/uL (ref 0.7–4.0)
MCH: 37.2 pg — ABNORMAL HIGH (ref 26.0–34.0)
MCHC: 34.7 g/dL (ref 30.0–36.0)
MCV: 107.4 fL — ABNORMAL HIGH (ref 80.0–100.0)
Monocytes Absolute: 1.1 10*3/uL — ABNORMAL HIGH (ref 0.1–1.0)
Monocytes Relative: 9 %
Neutro Abs: 8.4 10*3/uL — ABNORMAL HIGH (ref 1.7–7.7)
Neutrophils Relative %: 73 %
Platelets: 95 10*3/uL — ABNORMAL LOW (ref 150–400)
RBC: 3.49 MIL/uL — ABNORMAL LOW (ref 4.22–5.81)
RDW: 12.9 % (ref 11.5–15.5)
WBC: 11.5 10*3/uL — ABNORMAL HIGH (ref 4.0–10.5)
nRBC: 0 % (ref 0.0–0.2)

## 2020-09-21 LAB — GLUCOSE, CAPILLARY
Glucose-Capillary: 215 mg/dL — ABNORMAL HIGH (ref 70–99)
Glucose-Capillary: 155 mg/dL — ABNORMAL HIGH (ref 70–99)
Glucose-Capillary: 113 mg/dL — ABNORMAL HIGH (ref 70–99)
Glucose-Capillary: 164 mg/dL — ABNORMAL HIGH (ref 70–99)

## 2020-09-21 LAB — PROCALCITONIN: Procalcitonin: 0.69 ng/mL

## 2020-09-21 LAB — MAGNESIUM: Magnesium: 2.2 mg/dL (ref 1.7–2.4)

## 2020-09-21 LAB — LIPASE, BLOOD: Lipase: 41 U/L (ref 11–51)

## 2020-09-21 MED ORDER — APIXABAN 5 MG PO TABS
10.0000 mg | ORAL_TABLET | Freq: Two times a day (BID) | ORAL | Status: DC
Start: 2020-09-21 — End: 2020-09-21

## 2020-09-21 MED ORDER — APIXABAN 5 MG PO TABS: 5.0000 mg | ORAL_TABLET | Freq: Two times a day (BID) | ORAL | Status: DC

## 2020-09-21 MED ORDER — FLEET ENEMA 7-19 GM/118ML RE ENEM
1.0000 | ENEMA | Freq: Once | RECTAL | Status: AC
  Administered 2020-09-21: 1 via RECTAL
  Filled 2020-09-21: qty 1

## 2020-09-21 MED ORDER — APIXABAN 5 MG PO TABS
5.0000 mg | ORAL_TABLET | Freq: Two times a day (BID) | ORAL | Status: DC
  Administered 2020-09-21 – 2020-09-24 (×7): 5 mg via ORAL
  Filled 2020-09-21 (×7): qty 1

## 2020-09-21 MED ORDER — MELATONIN 3 MG PO TABS: 3.0000 mg | ORAL_TABLET | Freq: Once | ORAL | Status: DC

## 2020-09-21 MED ORDER — POTASSIUM CHLORIDE CRYS ER 20 MEQ PO TBCR
40.0000 meq | EXTENDED_RELEASE_TABLET | ORAL | Status: AC
  Administered 2020-09-21 (×2): 40 meq via ORAL
  Filled 2020-09-21 (×2): qty 2

## 2020-09-21 MED ORDER — MELATONIN 5 MG PO TABS
5.0000 mg | ORAL_TABLET | Freq: Once | ORAL | Status: AC
  Administered 2020-09-21: 5 mg via ORAL
  Filled 2020-09-21: qty 1

## 2020-09-21 MED ORDER — BISACODYL 10 MG RE SUPP: 10.0000 mg | Freq: Once | RECTAL | Status: DC

## 2020-09-21 MED ORDER — INSULIN GLARGINE 100 UNIT/ML ~~LOC~~ SOLN
26.0000 [IU] | SUBCUTANEOUS | Status: DC
  Administered 2020-09-21 – 2020-09-24 (×4): 26 [IU] via SUBCUTANEOUS
  Filled 2020-09-21 (×5): qty 0.26

## 2020-09-21 NOTE — Progress Notes (Addendum)
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  CT abdomen and pelvis done consistent with acute pancreatitis without pseudocyst or abscess formation, no evidence of necrosis or hemorrhage seen.  Patient noted on admission to have a lipase of 2959, markedly hyperglycemic with a anion gap concerning for DKA.  Patient also noted to have a substantial lactic acidosis of 6.7.  Patient aggressively hydrated with IV fluids, placed on the insulin drip, placed on bowel rest, pain management.   Assessment & Plan:   Principal Problem:   Acute pancreatitis Active Problems:   Idiopathic polyneuropathy   DKA (diabetic ketoacidosis) (HCC)   Mixed hyperlipidemia   Lactic acidosis   Elevated BP without diagnosis of hypertension   Alcohol dependence with unspecified alcohol-induced disorder (HCC)   Hypomagnesemia   Hypophosphatemia   1 acute pancreatitis -?  Etiology. -Patient presented with 2-day history of severe abdominal pain, markedly elevated lipase levels, CT abdomen and pelvis consistent with acute pancreatitis without evidence of abscess formation, necrosis or hemorrhage. -Patient initially denied to admitting physician any alcohol use or illicit drug use however admitted to drinking bottles of wine 2-3 times a week. -Fasting lipid panel with a total cholesterol of 249, LDL of 163, triglycerides of 180. -Right upper quadrant ultrasound done with no gallstones or biliary distention, increased hepatic echogenicity consistent with fatty infiltration and/or hepatocellular disease. -Slowly  improving. -Lipase levels trending down currently at 41 from 211 from 1095 from 2959.   -Saline lock IV fluids. -Advance to full liquid diet. -Continue pain management, supportive care.  2.  New onset diabetes mellitus/DKA -Patient presenting with no known history of diabetes. -Patient noted to be in DKA with the severe hypoglycemia, anion gap. -Hemoglobin A1c 9.4.   -Patient has been transitioned off glucose stabilizer/insulin drip and currently on subcutaneous Lantus.   -CBG 215 this morning. -Increase Lantus to 26 units daily. -SSI. -Diabetes coordinator following.  3.  Hypomagnesemia/hypophosphatemia/hypokalemia -Repleted.  Magnesium at 2.2.   -Phosphorus at 2.3.   -Continue K-Phos 250 mg p.o. twice daily x1 day.   -Potassium at 3.1, KDur 40 mEq p.o. every 4 hours x2 doses. -Repeat labs in the morning.  4.  Lactic acidosis -Patient presented with severe lactic acidosis likely secondary to severe volume depletion. -Lactic acid level trended down with hydration.    5.  Mixed hyperlipidemia -Statin.    6.  Idiopathic polyneuropathy -Patient follows an outpatient neurology at [facility]. -Continue home regimen nortriptyline nightly for associated neuropathic pain. -Outpatient follow-up.  7.  Elevated blood pressure without diagnosis of hypertension -Secondary to severe pain.   -Improved.   -Patient with no history of hypertension.   -IV hydralazine as needed.   8.  Morbid obesity  9.  Leukocytosis -Likely reactive leukocytosis secondary to acute pancreatitis. -Afebrile.   -Urinalysis unremarkable.   -No respiratory symptoms.   -Leukocytosis trending down.   -Follow.   10.  Alcohol use -It is noted per admitting physician that patient denied any alcohol use however patient admits to drinking 2 bottles of wine 2 or 3 times a week. -Patient with no signs of withdrawal. -Continue  Ativan withdrawal protocol, thiamine, folic acid, multivitamin.    11.  History of  DVT/PE -Transition from Lovenox back to home regimen Eliquis.  12.  Constipation -Patient states has not had a bowel movement in approximately 5 days. -Patient with complaints of some abdominal cramping and nausea with oral intake. -Fleets enema x1. -If no response with fleets enema and continued abdominal discomfort we will check abdominal films. -Follow.     DVT prophylaxis: Lovenox >>>Eliquis Code Status: Full Family Communication: Updated patient.  No family at bedside. Disposition:   Status is: Inpatient  Remains inpatient appropriate because:Inpatient level of care appropriate due to severity of illness  Dispo: The patient is from: Home              Anticipated d/c is to: Home              Patient currently is not medically stable to d/c.   Difficult to place patient No       Consultants:  None  Procedures:  CT abdomen and pelvis 09/17/2020 Right upper quadrant ultrasound 09/18/2020  Antimicrobials: None   Subjective: Laying in bed.  No chest pain.  No shortness of breath.  Complains of some mid abdominal cramping with oral intake and some nausea.  Positive belching.  No flatus today.  Stated has not had a bowel movement x5 days.  No emesis.  Tolerated clears.  Objective: Vitals:   09/21/20 0400 09/21/20 0500 09/21/20 0600 09/21/20 0800  BP: 121/67 140/85 139/85   Pulse: 90 89 90   Resp: (!) 24 (!) 28 (!) 22   Temp:    97.9 F (36.6 C)  TempSrc:    Oral  SpO2: 100% 99% 100%   Weight:      Height:        Intake/Output Summary (Last 24 hours) at 09/21/2020 0957 Last data filed at 09/21/2020 0600 Gross per 24 hour  Intake 2241.68 ml  Output 450 ml  Net 1791.68 ml    Filed Weights   09/17/20 1913 09/18/20 0402  Weight: 122.5 kg 117.9 kg    Examination:  General exam: : NAD Respiratory system: CTA B.  No wheezes, no rhonchi.  Speaking in full sentences.  Normal respiratory effort. Cardiovascular system: Regular rate and rhythm no murmurs rubs  or gallops.  No JVD.  Trace to 1+ bilateral lower extremity edema. Gastrointestinal system: Abdomen soft, distended, nontender to palpation in the epigastrium, positive bowel sounds.  No rebound.  No guarding.  Central nervous system: Alert and oriented. No focal neurological deficits. Extremities: Symmetric 5 x 5 power. Skin: No rashes, lesions or ulcers Psychiatry: Judgement and insight appear normal. Mood & affect appropriate.  Data Reviewed: I have personally reviewed following labs and imaging studies  CBC: Recent Labs  Lab 09/17/20 1918 09/17/20 2203 09/19/20 0251 09/20/20 0238 09/21/20 0247  WBC 22.4*  --  24.8* 21.1* 11.5*  NEUTROABS  --   --   --  18.4* 8.4*  HGB 14.9 15.6 15.4 14.6 13.0  HCT 41.4 46.0 42.4 42.7 37.5*  MCV 104.3*  --  104.4* 108.9* 107.4*  PLT 171  --  79* 82* 95*     Basic Metabolic Panel: Recent Labs  Lab 09/18/20 1651 09/18/20 1957 09/19/20 0813 09/20/20 0238 09/21/20 0247  NA 131* 129* 132* 132* 131*  K 3.6 3.6 3.9 3.7 3.1*  CL 98 98 100 99 101  CO2 21* 22 22 20* 20*  GLUCOSE 117* 121* 169* 247* 203*  BUN  11 11 12 17 20   CREATININE 0.65 0.73 0.75 0.92 0.73  CALCIUM 8.7* 8.4* 8.0* 7.9* 7.3*  MG  --   --  1.3* 2.2 2.2  PHOS  --   --  2.1*  --  2.3*     GFR: Estimated Creatinine Clearance: 135.4 mL/min (by C-G formula based on SCr of 0.73 mg/dL).  Liver Function Tests: Recent Labs  Lab 09/17/20 1918 09/19/20 0813 09/21/20 0247  AST 39 40  --   ALT 50* 26  --   ALKPHOS 80 54  --   BILITOT 0.7 2.4*  --   PROT 7.4 5.4*  --   ALBUMIN 4.1 2.6* 2.1*     CBG: Recent Labs  Lab 09/20/20 0738 09/20/20 1133 09/20/20 1619 09/20/20 2253 09/21/20 0812  GLUCAP 268* 236* 187* 185* 215*      Recent Results (from the past 240 hour(s))  Resp Panel by RT-PCR (Flu A&B, Covid) Nasopharyngeal Swab     Status: None   Collection Time: 09/17/20  9:50 PM   Specimen: Nasopharyngeal Swab; Nasopharyngeal(NP) swabs in vial transport medium   Result Value Ref Range Status   SARS Coronavirus 2 by RT PCR NEGATIVE NEGATIVE Final    Comment: (NOTE) SARS-CoV-2 target nucleic acids are NOT DETECTED.  The SARS-CoV-2 RNA is generally detectable in upper respiratory specimens during the acute phase of infection. The lowest concentration of SARS-CoV-2 viral copies this assay can detect is 138 copies/mL. A negative result does not preclude SARS-Cov-2 infection and should not be used as the sole basis for treatment or other patient management decisions. A negative result may occur with  improper specimen collection/handling, submission of specimen other than nasopharyngeal swab, presence of viral mutation(s) within the areas targeted by this assay, and inadequate number of viral copies(<138 copies/mL). A negative result must be combined with clinical observations, patient history, and epidemiological information. The expected result is Negative.  Fact Sheet for Patients:  09/19/20  Fact Sheet for Healthcare Providers:  BloggerCourse.com  This test is no t yet approved or cleared by the [employer].[place] FDA and  has been authorized for detection and/or diagnosis of SARS-CoV-2 by FDA under an Emergency Use Authorization (EUA). This EUA will remain  in effect (meaning this test can be used) for the duration of the COVID-19 declaration under Section 564(b)(1) of the Act, 21 U.S.C.section 360bbb-3(b)(1), unless the authorization is terminated  or revoked sooner.       Influenza A by PCR NEGATIVE NEGATIVE Final   Influenza B by PCR NEGATIVE NEGATIVE Final    Comment: (NOTE) The Xpert Xpress SARS-CoV-2/FLU/RSV plus assay is intended as an aid in the diagnosis of influenza from Nasopharyngeal swab specimens and should not be used as a sole basis for treatment. Nasal washings and aspirates are unacceptable for Xpert Xpress SARS-CoV-2/FLU/RSV testing.  Fact Sheet for  Patients: [facility]  Fact Sheet for Healthcare Providers: BloggerCourse.com  This test is not yet approved or cleared by the [employer]ious[employer]ker.[employer] FDA and has been authorized for detection and/or diagnosis of SARS-CoV-2 by FDA under an Emergency Use Authorization (EUA). This EUA will remain in effect (meaning this test can be used) for the duration of the COVID-19 declaration under Section 564(b)(1) of the Act, 21 U.S.C. section 360bbb-3(b)(1), unless the authorization is terminated or revoked.  Performed at [facility], [address] [place], [place]   MRSA Next Gen by PCR, Nasal     Status: Abnormal   Collection Time: 09/18/20  3:33  AM   Specimen: Nasal Mucosa; Nasal Swab  Result Value Ref Range Status   MRSA by PCR Next Gen DETECTED (A) NOT DETECTED Final    Comment: RESULT CALLED TO, READ BACK BY AND VERIFIED WITH: PAULINE, RN @ [phone] ON 09/18/20 C VARNER (NOTE) The GeneXpert MRSA Assay (FDA approved for NASAL specimens only), is one component of a comprehensive MRSA colonization surveillance program. It is not intended to diagnose MRSA infection nor to guide or monitor treatment for MRSA infections. Test performance is not FDA approved in patients less than 58 years old. Performed at [facility], [address] [place], [place] [address]           Radiology Studies: No results found.      Scheduled Meds:  apixaban  5 mg Oral BID   Chlorhexidine Gluconate Cloth  6 each Topical Daily   folic acid  1 mg Oral Daily   insulin aspart  0-20 Units Subcutaneous TID WC   insulin aspart  0-5 Units Subcutaneous QHS   insulin glargine  26 Units Subcutaneous Q24H   multivitamin with minerals  1 tablet Oral Daily   mupirocin ointment  1 application Nasal BID   nortriptyline  10 mg Oral Daily   nortriptyline  20 mg Oral QHS   pantoprazole (PROTONIX) IV  40 mg Intravenous  Daily   phosphorus  250 mg Oral BID   potassium chloride  40 mEq Oral Q4H   thiamine  100 mg Oral Daily   Or   thiamine  100 mg Intravenous Daily   Continuous Infusions:     LOS: 3 days    Time spent: 40 minutes    Ramiro Harvestaniel Leopold Smyers, MD [facility]   To contact the attending provider between 7A-7P or the covering provider during after hours 7P-7A, please log into the web site [email or web address] and access using universal [place] password for that web site. If you do not have the password, please call the hospital operator.  09/21/2020, 9:57 AM

## 2020-09-21 NOTE — TOC Progression Note (Signed)
Transition of Care [facility]) - Progression Note    Patient Details  Name: Bryce Mendoza MRN: [id number] Date of Birth: 06/12/1960  Transition of Care [facility]) CM/SW Contact  Golda Acre, RN Phone Number: 09/21/2020, 7:44 AM  Clinical Narrative:    Patient is now on sub-q insulin.  Will need to be seen by the diabetic coordinator as a new diabetic.  Following for toc needs and progression.   Expected Discharge Plan: Home/Self Care Barriers to Discharge: Continued Medical Work up  Expected Discharge Plan and Services Expected Discharge Plan: Home/Self Care       Living arrangements for the past 2 months: Single Family Home                                       Social Determinants of Health (SDOH) Interventions    Readmission Risk Interventions No flowsheet data found.

## 2020-09-21 NOTE — Progress Notes (Signed)
ANTICOAGULATION CONSULT NOTE  Pharmacy Consult for apixaban Indication:  Hx PE/DVT  No Known Allergies  Patient Measurements: Height: 6\' 1"  (185.4 cm) Weight: 117.9 kg (260 lb) IBW/kg (Calculated) : 79.9  Vital Signs: Temp: 96.2 F (35.7 C) (07/22 0326) Temp Source: Axillary (07/22 0326) BP: 139/85 (07/22 0600) Pulse Rate: 90 (07/22 0600)  Labs: Recent Labs    09/19/20 0251 09/19/20 0813 09/20/20 0238 09/21/20 0247  HGB 15.4  --  14.6 13.0  HCT 42.4  --  42.7 37.5*  PLT 79*  --  82* 95*  CREATININE  --  0.75 0.92 0.73    Estimated Creatinine Clearance: 135.4 mL/min (by C-G formula based on SCr of 0.73 mg/dL).   Medical History: Past Medical History:  Diagnosis Date   Ascending paralysis (HCC) 06/2019   DVT (deep venous thrombosis) (HCC) 09/09/2017   Empyema lung (HCC)    Pulmonary embolism (HCC) 09/08/2017    Medications:  Medications Prior to Admission  Medication Sig Dispense Refill Last Dose   acetaminophen (TYLENOL) 500 MG tablet Take 500 mg by mouth daily as needed for mild pain.   unk   apixaban (ELIQUIS) 5 MG TABS tablet Take 1 tablet (5 mg total) by mouth 2 (two) times daily. 180 tablet 4 09/17/2020 at 1630   atorvastatin (LIPITOR) 40 MG tablet Take 1 tablet (40 mg total) by mouth daily. 90 tablet 3 09/17/2020   nortriptyline (PAMELOR) 10 MG capsule TAKE 1 CAPSULE BY MOUTH DAILY and  TAKE 2 capsules by mouth AT BEDTIME FOR NEUROPATHY (Patient taking differently: Take 10-20 mg by mouth See admin instructions. Taking 10mg  in the AM and 20 mg at bedtime.) 180 capsule 1 09/17/2020   DULoxetine (CYMBALTA) 60 MG capsule Take 60 mg by mouth daily.      Scheduled:   Chlorhexidine Gluconate Cloth  6 each Topical Daily   folic acid  1 mg Oral Daily   insulin aspart  0-20 Units Subcutaneous TID WC   insulin aspart  0-5 Units Subcutaneous QHS   insulin glargine  26 Units Subcutaneous Q24H   multivitamin with minerals  1 tablet Oral Daily   mupirocin ointment  1  application Nasal BID   nortriptyline  10 mg Oral Daily   nortriptyline  20 mg Oral QHS   pantoprazole (PROTONIX) IV  40 mg Intravenous Daily   phosphorus  250 mg Oral BID   potassium chloride  40 mEq Oral Q4H   thiamine  100 mg Oral Daily   Or   thiamine  100 mg Intravenous Daily    Assessment: Bryce Mendoza is a 60 y.o. male admitted on 09/17/2020 (with history of DVT/PE on chronic apixaban prior to admission) for acute pancreatitis and DKA.  On 7/19, enoxaparin was initiated while apixaban held.  Today, Pharmacy has been consulted to resume apixaban dosing.  Last dose of enoxaparin was 7/21 2256.  Goal of Therapy:  Prevent thrombo embolism Monitor platelets by anticoagulation protocol: Yes   Plan:  Discontinue enoxaparin Resume apixaban 5 mg po BID Pharmacy will sign off for Eliquis consult, but will follow pt peripherally along with you monitoring CBC, s/s bleeding   Bryce Mendoza P. 8/21, PharmD, BCPS Clinical Pharmacist Accident Please utilize Amion for appropriate phone number to reach the unit pharmacist [facility]) 09/21/2020 8:34 AM

## 2020-09-22 DIAGNOSIS — E0810 Diabetes mellitus due to underlying condition with ketoacidosis without coma: Secondary | ICD-10-CM | POA: Diagnosis not present

## 2020-09-22 DIAGNOSIS — R030 Elevated blood-pressure reading, without diagnosis of hypertension: Secondary | ICD-10-CM | POA: Diagnosis not present

## 2020-09-22 DIAGNOSIS — G609 Hereditary and idiopathic neuropathy, unspecified: Secondary | ICD-10-CM | POA: Diagnosis not present

## 2020-09-22 DIAGNOSIS — K8500 Idiopathic acute pancreatitis without necrosis or infection: Secondary | ICD-10-CM | POA: Diagnosis not present

## 2020-09-22 LAB — CBC
HCT: 39.5 % (ref 39.0–52.0)
Hemoglobin: 13.7 g/dL (ref 13.0–17.0)
MCH: 37.3 pg — ABNORMAL HIGH (ref 26.0–34.0)
MCHC: 34.7 g/dL (ref 30.0–36.0)
MCV: 107.6 fL — ABNORMAL HIGH (ref 80.0–100.0)
Platelets: 116 10*3/uL — ABNORMAL LOW (ref 150–400)
RBC: 3.67 MIL/uL — ABNORMAL LOW (ref 4.22–5.81)
RDW: 12.8 % (ref 11.5–15.5)
WBC: 8 10*3/uL (ref 4.0–10.5)
nRBC: 0 % (ref 0.0–0.2)

## 2020-09-22 LAB — RENAL FUNCTION PANEL
Albumin: 2.3 g/dL — ABNORMAL LOW (ref 3.5–5.0)
Anion gap: 10 (ref 5–15)
BUN: 18 mg/dL (ref 6–20)
CO2: 20 mmol/L — ABNORMAL LOW (ref 22–32)
Calcium: 7.4 mg/dL — ABNORMAL LOW (ref 8.9–10.3)
Chloride: 102 mmol/L (ref 98–111)
Creatinine, Ser: 0.82 mg/dL (ref 0.61–1.24)
GFR, Estimated: >60 mL/min (ref >60)
Glucose, Bld: 170 mg/dL — ABNORMAL HIGH (ref 70–99)
Phosphorus: 2.7 mg/dL (ref 2.5–4.6)
Potassium: 3.3 mmol/L — ABNORMAL LOW (ref 3.5–5.1)
Sodium: 132 mmol/L — ABNORMAL LOW (ref 135–145)

## 2020-09-22 LAB — GLUCOSE, CAPILLARY
Glucose-Capillary: 150 mg/dL — ABNORMAL HIGH (ref 70–99)
Glucose-Capillary: 171 mg/dL — ABNORMAL HIGH (ref 70–99)
Glucose-Capillary: 168 mg/dL — ABNORMAL HIGH (ref 70–99)
Glucose-Capillary: 199 mg/dL — ABNORMAL HIGH (ref 70–99)

## 2020-09-22 LAB — MAGNESIUM: Magnesium: 2.1 mg/dL (ref 1.7–2.4)

## 2020-09-22 MED ORDER — PANTOPRAZOLE SODIUM 40 MG PO TBEC
40.0000 mg | DELAYED_RELEASE_TABLET | Freq: Every day | ORAL | Status: DC
  Administered 2020-09-23 – 2020-09-24 (×2): 40 mg via ORAL
  Filled 2020-09-22 (×2): qty 1

## 2020-09-22 MED ORDER — DIPHENHYDRAMINE HCL 50 MG/ML IJ SOLN
50.0000 mg | Freq: Once | INTRAMUSCULAR | Status: AC
  Administered 2020-09-22: 50 mg via INTRAVENOUS

## 2020-09-22 MED ORDER — FAMOTIDINE 20 MG PO TABS
20.0000 mg | ORAL_TABLET | Freq: Two times a day (BID) | ORAL | Status: DC
  Administered 2020-09-22 – 2020-09-23 (×4): 20 mg via ORAL
  Filled 2020-09-22 (×4): qty 1

## 2020-09-22 MED ORDER — DIPHENHYDRAMINE HCL 50 MG/ML IJ SOLN
INTRAMUSCULAR | Status: AC
  Filled 2020-09-22: qty 1

## 2020-09-22 MED ORDER — POTASSIUM CHLORIDE CRYS ER 20 MEQ PO TBCR
40.0000 meq | EXTENDED_RELEASE_TABLET | Freq: Once | ORAL | Status: AC
  Administered 2020-09-22: 40 meq via ORAL
  Filled 2020-09-22: qty 2

## 2020-09-22 MED ORDER — PREDNISONE 20 MG PO TABS
40.0000 mg | ORAL_TABLET | Freq: Once | ORAL | Status: AC
  Administered 2020-09-22: 40 mg via ORAL
  Filled 2020-09-22: qty 2

## 2020-09-22 MED ORDER — DIPHENHYDRAMINE HCL 25 MG PO CAPS
25.0000 mg | ORAL_CAPSULE | Freq: Two times a day (BID) | ORAL | Status: DC
  Administered 2020-09-22 – 2020-09-23 (×4): 25 mg via ORAL
  Filled 2020-09-22 (×4): qty 1

## 2020-09-22 NOTE — Progress Notes (Signed)
PHARMACIST - PHYSICIAN COMMUNICATION  DR:   Janee Morn  CONCERNING: IV to Oral Route Change Policy  RECOMMENDATION: This patient is receiving pantoprazole by the intravenous route.  Based on criteria approved by the Pharmacy and Therapeutics Committee, the intravenous medication(s) is/are being converted to the equivalent oral dose form(s).   DESCRIPTION: These criteria include: The patient is eating (either orally or via tube) and/or has been taking other orally administered medications for a least 24 hours The patient has no evidence of active gastrointestinal bleeding or impaired GI absorption (gastrectomy, short bowel, patient on TNA or NPO).  If you have questions about this conversion, please contact the Pharmacy Department  []   [phone] )  ( [phone] []   [phone] )  [facility] []   [phone] )  [facility] []   [phone] )  [facility] [x]   [phone] )  [facility]   [place] Francisco P. ( [phone], PharmD, BCPS Clinical Pharmacist [place] Please utilize Amion for appropriate phone number to reach the unit pharmacist [facility]) 09/22/2020 10:36 AM

## 2020-09-22 NOTE — Progress Notes (Signed)
Patient has new complaints of itching all over body. The itching started from the feet and worked it's way to arms and torso. RN assessed skin and discovered new red, blotchy rashes all over patient's body; excluding neck and face. Messaged on-call [facility] Provider to visit patient at bedside; awaiting response.

## 2020-09-22 NOTE — Progress Notes (Signed)
Patient given Benadryl. MD rounding this AM will assess rash. Patient not complaining of SOB, pain, heart palpitations, and no change in vital signs.

## 2020-09-22 NOTE — Progress Notes (Signed)
PROGRESS NOTE    Bryce Mendoza  WUJ:[id number] DOB: 21-Jun-1960 DOA: 09/17/2020 PCP: Claiborne Rigg, NP    Chief Complaint  Patient presents with   Abdominal Pain    Brief Narrative: Patient 60 year old gentleman history of DVT and PE(2019) on Eliquis, hyperlipidemia, idiopathic severe polyneuropathy presented [facility] with epigastric pain and transferred to [facility] for acute pancreatitis and new onset DM in DKA.  CT abdomen and pelvis done consistent with acute pancreatitis without pseudocyst or abscess formation, no evidence of necrosis or hemorrhage seen.  Patient noted on admission to have a lipase of 2959, markedly hyperglycemic with a anion gap concerning for DKA.  Patient also noted to have a substantial lactic acidosis of 6.7.  Patient aggressively hydrated with IV fluids, placed on the insulin drip, placed on bowel rest, pain management.   Assessment & Plan:   Principal Problem:   Acute pancreatitis Active Problems:   Idiopathic polyneuropathy   DKA (diabetic ketoacidosis) (HCC)   Mixed hyperlipidemia   Lactic acidosis   Elevated BP without diagnosis of hypertension   Alcohol dependence with unspecified alcohol-induced disorder (HCC)   Hypomagnesemia   Hypophosphatemia   1 acute pancreatitis -?  Etiology. -Patient presented with 2-day history of severe abdominal pain, markedly elevated lipase levels, CT abdomen and pelvis consistent with acute pancreatitis without evidence of abscess formation, necrosis or hemorrhage. -Patient initially denied to admitting physician any alcohol use or illicit drug use however admitted to drinking bottles of wine 2-3 times a week. -Fasting lipid panel with a total cholesterol of 249, LDL of 163, triglycerides of 180. -Right upper quadrant ultrasound done with no gallstones or biliary distention, increased hepatic echogenicity consistent with fatty infiltration and/or hepatocellular disease. -Improving  clinically.   -Lipase levels trending down  at 41 from 211 from 1095 from 2959.   -Saline lock IV fluids. -Tolerated full liquid diet. -Advance to soft diet/carb modified diet -Continue pain management, supportive care.  2.  New onset diabetes mellitus/DKA -Patient presenting with no known history of diabetes. -Patient noted to be in DKA with the severe hypoglycemia, anion gap. -Hemoglobin A1c 9.4.   -Patient has been transitioned off glucose stabilizer/insulin drip and currently on subcutaneous Lantus.   -CBG 150 this morning.   -Continue current regimen of Lantus 26 units daily, SSI.   -Diabetes coordinator following.    3.  Hypomagnesemia/hypophosphatemia/hypokalemia -Potassium at 3.3 this morning, magnesium at 2.1, phosphorus at 2.7.   -K-Dur 40 mEq p.o. x1.   -Repeat labs in the morning.   4.  Lactic acidosis -Patient presented with severe lactic acidosis likely secondary to severe volume depletion. -Lactic acid levels improved with hydration.  5.  Mixed hyperlipidemia -Statin.  6.  Idiopathic polyneuropathy -Patient follows outpatient neurology at [facility]. -Continue home regimen nortriptyline nightly for associated neuropathic pain. -Outpatient follow-up.  7.  Elevated blood pressure without diagnosis of hypertension -Secondary to severe pain.   -Improved.   -Patient with no prior history of hypertension.   -IV hydralazine as needed.   -Outpatient follow-up.   8.  Morbid obesity  9.  Leukocytosis -Likely reactive leukocytosis secondary to acute pancreatitis. -Afebrile. -Urinalysis unremarkable.   -No respiratory symptoms.   -Leukocytosis improved. -No need for antibiotics at this time.  10.  Alcohol use -It is noted per admitting physician that patient denied any alcohol use however patient admits to drinking 2 bottles of wine 2 or 3 times a week. -No signs of withdrawal noted.   -  Continue the Ativan withdrawal protocol, thiamine, folic acid, multivitamin.    11.  History of DVT/PE -Was on Lovenox and has been transitioned back to home regimen Eliquis.    12.  Constipation -Patient stated had multiple bowel movement after fleets enema and Dulcolax suppository.   -MiraLAX as needed.    13.  Rash -Per RN patient developed a rash on lower abdominal wall and lower extremities overnight that improved with Benadryl. -??  Etiology. -Place on Pepcid 20 mg twice daily, Benadryl 25 mg twice daily, prednisone 40 mg p.o. x1. -Follow.     DVT prophylaxis: Lovenox >>>Eliquis Code Status: Full Family Communication: Updated patient.  No family at bedside. Disposition:   Status is: Inpatient  Remains inpatient appropriate because:Inpatient level of care appropriate due to severity of illness  Dispo: The patient is from: Home              Anticipated d/c is to: Home              Patient currently is not medically stable to d/c.   Difficult to place patient No       Consultants:  None  Procedures:  CT abdomen and pelvis 09/17/2020 Right upper quadrant ultrasound 09/18/2020  Antimicrobials: None   Subjective: Sitting up at the side of the bed states was feeling well this morning until he accidentally peed on the floor.  Denies chest pain.  No shortness of breath.  No abdominal pain.  Stated had multiple bowel movements with laxatives yesterday and stool is becoming a little too soft.  Per RN patient developed a rash on lower abdominal region and lower extremities which improved with Benadryl.   Objective: Vitals:   09/22/20 0400 09/22/20 0500 09/22/20 0605 09/22/20 0800  BP: (!) 178/153 (!) 160/96 (!) 147/87   Pulse: 87 87 (!) 34   Resp: (!) 23 (!) 21 (!) 26   Temp:    98.1 F (36.7 C)  TempSrc:    Oral  SpO2: 98% 99% 100%   Weight:      Height:       No intake or output data in the 24 hours ending 09/22/20 0932  Filed Weights   09/17/20 1913 09/18/20 0402  Weight: 122.5 kg 117.9 kg    Examination:  General exam: :  NAD Respiratory system: Lungs clear to auscultation bilaterally.  No wheezes, no crackles, no rhonchi.  Normal respiratory effort.  Speaking in full sentences.   Cardiovascular system: RRR no murmurs rubs or gallops.  No JVD.  Trace to 1+ bilateral lower extremity edema.  Gastrointestinal system: Abdomen is soft, distended, nontender to palpation, positive bowel sounds.  No rebound.  No guarding.   Central nervous system: Alert and oriented.  Moving extremities spontaneously.  No focal neurological deficits.   Extremities: Symmetric 5 x 5 power. Skin: No rashes, lesions or ulcers Psychiatry: Judgement and insight appear normal. Mood & affect appropriate.  Data Reviewed: I have personally reviewed following labs and imaging studies  CBC: Recent Labs  Lab 09/17/20 1918 09/17/20 2203 09/19/20 0251 09/20/20 0238 09/21/20 0247 09/22/20 0146  WBC 22.4*  --  24.8* 21.1* 11.5* 8.0  NEUTROABS  --   --   --  18.4* 8.4*  --   HGB 14.9 15.6 15.4 14.6 13.0 13.7  HCT 41.4 46.0 42.4 42.7 37.5* 39.5  MCV 104.3*  --  104.4* 108.9* 107.4* 107.6*  PLT 171  --  79* 82* 95* 116*     Basic Metabolic  Panel: Recent Labs  Lab 09/18/20 1957 09/19/20 0813 09/20/20 0238 09/21/20 0247 09/22/20 0146  NA 129* 132* 132* 131* 132*  K 3.6 3.9 3.7 3.1* 3.3*  CL 98 100 99 101 102  CO2 22 22 20* 20* 20*  GLUCOSE 121* 169* 247* 203* 170*  BUN 11 12 17 20 18   CREATININE 0.73 0.75 0.92 0.73 0.82  CALCIUM 8.4* 8.0* 7.9* 7.3* 7.4*  MG  --  1.3* 2.2 2.2 2.1  PHOS  --  2.1*  --  2.3* 2.7     GFR: Estimated Creatinine Clearance: 132.1 mL/min (by C-G formula based on SCr of 0.82 mg/dL).  Liver Function Tests: Recent Labs  Lab 09/17/20 1918 09/19/20 0813 09/21/20 0247 09/22/20 0146  AST 39 40  --   --   ALT 50* 26  --   --   ALKPHOS 80 54  --   --   BILITOT 0.7 2.4*  --   --   PROT 7.4 5.4*  --   --   ALBUMIN 4.1 2.6* 2.1* 2.3*     CBG: Recent Labs  Lab 09/21/20 0812 09/21/20 1200  09/21/20 1608 09/21/20 2157 09/22/20 0802  GLUCAP 215* 155* 113* 164* 150*      Recent Results (from the past 240 hour(s))  Resp Panel by RT-PCR (Flu A&B, Covid) Nasopharyngeal Swab     Status: None   Collection Time: 09/17/20  9:50 PM   Specimen: Nasopharyngeal Swab; Nasopharyngeal(NP) swabs in vial transport medium  Result Value Ref Range Status   SARS Coronavirus 2 by RT PCR NEGATIVE NEGATIVE Final    Comment: (NOTE) SARS-CoV-2 target nucleic acids are NOT DETECTED.  The SARS-CoV-2 RNA is generally detectable in upper respiratory specimens during the acute phase of infection. The lowest concentration of SARS-CoV-2 viral copies this assay can detect is 138 copies/mL. A negative result does not preclude SARS-Cov-2 infection and should not be used as the sole basis for treatment or other patient management decisions. A negative result may occur with  improper specimen collection/handling, submission of specimen other than nasopharyngeal swab, presence of viral mutation(s) within the areas targeted by this assay, and inadequate number of viral copies(<138 copies/mL). A negative result must be combined with clinical observations, patient history, and epidemiological information. The expected result is Negative.  Fact Sheet for Patients:  09/19/20  Fact Sheet for Healthcare Providers:  BloggerCourse.com  This test is no t yet approved or cleared by the [employer] FDA and  has been authorized for detection and/or diagnosis of SARS-CoV-2 by FDA under an Emergency Use Authorization (EUA). This EUA will remain  in effect (meaning this test can be used) for the duration of the COVID-19 declaration under Section 564(b)(1) of the Act, 21 U.S.C.section 360bbb-3(b)(1), unless the authorization is terminated  or revoked sooner.       Influenza A by PCR NEGATIVE NEGATIVE Final   Influenza B by PCR NEGATIVE NEGATIVE Final     Comment: (NOTE) The Xpert Xpress SARS-CoV-2/FLU/RSV plus assay is intended as an aid in the diagnosis of influenza from Nasopharyngeal swab specimens and should not be used as a sole basis for treatment. Nasal washings and aspirates are unacceptable for Xpert Xpress SARS-CoV-2/FLU/RSV testing.  Fact Sheet for Patients: [place]  Fact Sheet for Healthcare Providers: BloggerCourse.com  This test is not yet approved or cleared by the [employer] FDA and has been authorized for detection and/or diagnosis of SARS-CoV-2 by FDA under an Emergency Use Authorization (EUA). This EUA will remain  in effect (meaning this test can be used) for the duration of the COVID-19 declaration under Section 564(b)(1) of the Act, 21 U.S.C. section 360bbb-3(b)(1), unless the authorization is terminated or revoked.  Performed at [facility], [address], [place], [place] [address]   MRSA Next Gen by PCR, Nasal     Status: Abnormal   Collection Time: 09/18/20  3:33 AM   Specimen: Nasal Mucosa; Nasal Swab  Result Value Ref Range Status   MRSA by PCR Next Gen DETECTED (A) NOT DETECTED Final    Comment: RESULT CALLED TO, READ BACK BY AND VERIFIED WITH: PAULINE, RN @ [phone] ON 09/18/20 C VARNER (NOTE) The GeneXpert MRSA Assay (FDA approved for NASAL specimens only), is one component of a comprehensive MRSA colonization surveillance program. It is not intended to diagnose MRSA infection nor to guide or monitor treatment for MRSA infections. Test performance is not FDA approved in patients less than 58 years old. Performed at [facility], [address] [place], [place] [address]           Radiology Studies: No results found.      Scheduled Meds:  apixaban  5 mg Oral BID   bisacodyl  10 mg Rectal Once   Chlorhexidine Gluconate Cloth  6 each Topical Daily   folic acid  1 mg Oral Daily    insulin aspart  0-20 Units Subcutaneous TID WC   insulin aspart  0-5 Units Subcutaneous QHS   insulin glargine  26 Units Subcutaneous Q24H   multivitamin with minerals  1 tablet Oral Daily   mupirocin ointment  1 application Nasal BID   nortriptyline  10 mg Oral Daily   nortriptyline  20 mg Oral QHS   pantoprazole (PROTONIX) IV  40 mg Intravenous Daily   thiamine  100 mg Oral Daily   Or   thiamine  100 mg Intravenous Daily   Continuous Infusions:     LOS: 4 days    Time spent: 35 minutes    Ramiro Harvestaniel Eshani Maestre, MD [facility]   To contact the attending provider between 7A-7P or the covering provider during after hours 7P-7A, please log into the web site [email or web address] and access using universal [place] password for that web site. If you do not have the password, please call the hospital operator.  09/22/2020, 9:32 AM

## 2020-09-23 DIAGNOSIS — K859 Acute pancreatitis without necrosis or infection, unspecified: Secondary | ICD-10-CM

## 2020-09-23 DIAGNOSIS — E860 Dehydration: Secondary | ICD-10-CM

## 2020-09-23 DIAGNOSIS — E0810 Diabetes mellitus due to underlying condition with ketoacidosis without coma: Secondary | ICD-10-CM | POA: Diagnosis not present

## 2020-09-23 DIAGNOSIS — F1029 Alcohol dependence with unspecified alcohol-induced disorder: Secondary | ICD-10-CM | POA: Diagnosis not present

## 2020-09-23 LAB — RENAL FUNCTION PANEL
Albumin: 2.4 g/dL — ABNORMAL LOW (ref 3.5–5.0)
Anion gap: 10 (ref 5–15)
BUN: 14 mg/dL (ref 6–20)
CO2: 15 mmol/L — ABNORMAL LOW (ref 22–32)
Calcium: 7.8 mg/dL — ABNORMAL LOW (ref 8.9–10.3)
Chloride: 107 mmol/L (ref 98–111)
Creatinine, Ser: 0.79 mg/dL (ref 0.61–1.24)
GFR, Estimated: >60 mL/min (ref >60)
Glucose, Bld: 198 mg/dL — ABNORMAL HIGH (ref 70–99)
Phosphorus: 3.5 mg/dL (ref 2.5–4.6)
Potassium: 3.6 mmol/L (ref 3.5–5.1)
Sodium: 132 mmol/L — ABNORMAL LOW (ref 135–145)

## 2020-09-23 LAB — GLUCOSE, CAPILLARY
Glucose-Capillary: 174 mg/dL — ABNORMAL HIGH (ref 70–99)
Glucose-Capillary: 196 mg/dL — ABNORMAL HIGH (ref 70–99)
Glucose-Capillary: 152 mg/dL — ABNORMAL HIGH (ref 70–99)
Glucose-Capillary: 136 mg/dL — ABNORMAL HIGH (ref 70–99)

## 2020-09-23 LAB — MAGNESIUM: Magnesium: 2.3 mg/dL (ref 1.7–2.4)

## 2020-09-23 MED ORDER — POTASSIUM CHLORIDE CRYS ER 20 MEQ PO TBCR
40.0000 meq | EXTENDED_RELEASE_TABLET | Freq: Once | ORAL | Status: AC
  Administered 2020-09-23: 40 meq via ORAL
  Filled 2020-09-23: qty 2

## 2020-09-23 MED ORDER — LOPERAMIDE HCL 2 MG PO CAPS
2.0000 mg | ORAL_CAPSULE | Freq: Once | ORAL | Status: AC
  Administered 2020-09-23: 2 mg via ORAL
  Filled 2020-09-23: qty 1

## 2020-09-23 MED ORDER — SODIUM CHLORIDE 0.9 % IV BOLUS
1000.0000 mL | Freq: Once | INTRAVENOUS | Status: AC
  Administered 2020-09-23: 1000 mL via INTRAVENOUS

## 2020-09-23 MED ORDER — SODIUM BICARBONATE 650 MG PO TABS
650.0000 mg | ORAL_TABLET | Freq: Three times a day (TID) | ORAL | Status: DC
  Administered 2020-09-23 (×3): 650 mg via ORAL
  Filled 2020-09-23 (×4): qty 1

## 2020-09-23 NOTE — Progress Notes (Signed)
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  CT abdomen and pelvis done consistent with acute pancreatitis without pseudocyst or abscess formation, no evidence of necrosis or hemorrhage seen.  Patient noted on admission to have a lipase of 2959, markedly hyperglycemic with a anion gap concerning for DKA.  Patient also noted to have a substantial lactic acidosis of 6.7.  Patient aggressively hydrated with IV fluids, placed on the insulin drip, placed on bowel rest, pain management.   Assessment & Plan:   Principal Problem:   Acute pancreatitis Active Problems:   Idiopathic polyneuropathy   DKA (diabetic ketoacidosis) (HCC)   Mixed hyperlipidemia   Lactic acidosis   Elevated BP without diagnosis of hypertension   Alcohol dependence with unspecified alcohol-induced disorder (HCC)   Hypomagnesemia   Hypophosphatemia   1 acute pancreatitis -?  Etiology. -Patient presented with 2-day history of severe abdominal pain, markedly elevated lipase levels, CT abdomen and pelvis consistent with acute pancreatitis without evidence of abscess formation, necrosis or hemorrhage. -Patient initially denied to admitting physician any alcohol use or illicit drug use however admitted to drinking bottles of wine 2-3 times a week. -Fasting lipid panel with a total cholesterol of 249, LDL of 163, triglycerides of 180. -Right upper quadrant ultrasound done with no gallstones or biliary distention, increased hepatic echogenicity consistent with fatty infiltration and/or hepatocellular disease. -Clinical  improvement. -Lipase levels trended down  at 41 from 211 from 1095 from 2959.   -Saline lock IV fluids. -Tolerated soft diet for breakfast this morning. -Pain management, supportive care.  2.  New onset diabetes mellitus/DKA -Patient presenting with no known history of diabetes. -Patient noted to be in DKA with the severe hypoglycemia, anion gap. -Hemoglobin A1c 9.4.   -Patient has been transitioned off glucose stabilizer/insulin drip and currently on subcutaneous Lantus.   -Patient with bicarb of 15 on blood work this morning however afebrile, normal anion gap. -Bicarb tablets 3 times daily.  1 L normal saline bolus. -CBG 174 this morning.   -Continue Lantus 26 units daily, SSI.   -Diabetic coordinator following.   3.  Hypomagnesemia/hypophosphatemia/hypokalemia -Potassium at 3.6, phosphorus at 3.5, magnesium at 2.3.  -Repeat labs in the morning.   4.  Lactic acidosis -Patient presented with severe lactic acidosis likely secondary to severe volume depletion. -Lactic acid levels improved with hydration. -Acidotic on labs this morning however afebrile. -Place on bicarb tablets 3 times daily and follow.  5.  Mixed hyperlipidemia -Continue statin.  6.  Idiopathic polyneuropathy -Patient follows outpatient neurology at [facility]. -Continue home regimen nortriptyline nightly for associated neuropathic pain. -Outpatient follow-up.  7.  Elevated blood pressure without diagnosis of hypertension -Secondary to severe pain.   -Resolved. -Patient with no prior history of hypertension.   -IV hydralazine as needed.   -Outpatient follow-up.   8.  Morbid obesity  9.  Leukocytosis -Likely reactive leukocytosis secondary to acute pancreatitis. -Afebrile. -Urinalysis unremarkable.   -No respiratory symptoms.   -Leukocytosis resolved. -No need for antibiotics at this time.  10.  Alcohol use -It is noted per admitting physician that patient denied any alcohol use however  patient admits to  drinking 2 bottles of wine 2 or 3 times a week. -No signs of withdrawal noted.   -Continue the Ativan withdrawal protocol, thiamine, folic acid, multivitamin.   11.  History of DVT/PE -Continue Eliquis.  12.  Constipation -Patient stated had multiple bowel movement after fleets enema and Dulcolax suppository.   -MiraLAX as needed.   -Imodium p.o. x1 due to multiple BMs.  13.  Rash -Per RN patient developed a rash on lower abdominal wall and lower extremities overnight that improved with Benadryl. -??  Etiology. -Improved.   -Continue Pepcid 20 mg twice daily, decrease Benadryl to daily.  Status post prednisone 40 mg x 1.  -Follow.     DVT prophylaxis: Lovenox >>>Eliquis Code Status: Full Family Communication: Updated patient.  No family at bedside. Disposition:   Status is: Inpatient  Remains inpatient appropriate because:Inpatient level of care appropriate due to severity of illness  Dispo: The patient is from: Home              Anticipated d/c is to: Home              Patient currently is not medically stable to d/c.   Difficult to place patient No       Consultants:  None  Procedures:  CT abdomen and pelvis 09/17/2020 Right upper quadrant ultrasound 09/18/2020  Antimicrobials: None   Subjective: Laying in bed.  Stated had multiple bowel movements yesterday after enema.  No chest pain.  No shortness of breath.  Denies any nausea or vomiting.  No abdominal pain.  Stated ate a full breakfast this morning.  Overall feeling better.  Asking to speak to someone about advanced directives.   Objective: Vitals:   09/23/20 0021 09/23/20 0052 09/23/20 0101 09/23/20 0613  BP:   139/90 117/83  Pulse:   90 85  Resp:   20 16  Temp: 98.5 F (36.9 C)  98.5 F (36.9 C) 97.6 F (36.4 C)  TempSrc: Oral  Oral Oral  SpO2:   100% 97%  Weight:  (!) 144.2 kg    Height:  6\' 1"  (1.854 m)      Intake/Output Summary (Last 24 hours) at 09/23/2020 1106 Last data filed at  09/23/2020 0600 Gross per 24 hour  Intake 1020 ml  Output 601 ml  Net 419 ml    Filed Weights   09/17/20 1913 09/18/20 0402 09/23/20 0052  Weight: 122.5 kg 117.9 kg (!) 144.2 kg    Examination:  General exam: : NAD Respiratory system: CTAB.  No wheezes, no rhonchi.  Speaking in full sentences.  Normal respiratory effort. Cardiovascular system: Regular rate and rhythm no murmurs rubs or gallops.  No JVD.  No lower extremity edema.  Gastrointestinal system: Abdomen soft, nontender, mildly distended, positive bowel sounds.  No rebound.  No guarding.  Central nervous system: Alert and oriented. No focal neurological deficits. Extremities: Symmetric 5 x 5 power. Skin: No rashes, lesions or ulcers Psychiatry: Judgement and insight appear normal. Mood & affect appropriate.  Data Reviewed: I have personally reviewed following labs and imaging studies  CBC: Recent Labs  Lab 09/17/20 1918 09/17/20 2203 09/19/20 0251 09/20/20 0238 09/21/20 0247 09/22/20 0146  WBC 22.4*  --  24.8* 21.1* 11.5* 8.0  NEUTROABS  --   --   --  18.4* 8.4*  --   HGB 14.9 15.6 15.4 14.6 13.0 13.7  HCT 41.4 46.0 42.4 42.7 37.5* 39.5  MCV 104.3*  --  104.4* 108.9* 107.4* 107.6*  PLT 171  --  79* 82* 95* 116*     Basic Metabolic Panel: Recent Labs  Lab 09/19/20 0813 09/20/20 0238 09/21/20 0247 09/22/20 0146 09/23/20 0323  NA 132* 132* 131* 132* 132*  K 3.9 3.7 3.1* 3.3* 3.6  CL 100 99 101 102 107  CO2 22 20* 20* 20* 15*  GLUCOSE 169* 247* 203* 170* 198*  BUN 12 17 20 18 14   CREATININE 0.75 0.92 0.73 0.82 0.79  CALCIUM 8.0* 7.9* 7.3* 7.4* 7.8*  MG 1.3* 2.2 2.2 2.1 2.3  PHOS 2.1*  --  2.3* 2.7 3.5     GFR: Estimated Creatinine Clearance: 150.3 mL/min (by C-G formula based on SCr of 0.79 mg/dL).  Liver Function Tests: Recent Labs  Lab 09/17/20 1918 09/19/20 0813 09/21/20 0247 09/22/20 0146 09/23/20 0323  AST 39 40  --   --   --   ALT 50* 26  --   --   --   ALKPHOS 80 54  --   --   --    BILITOT 0.7 2.4*  --   --   --   PROT 7.4 5.4*  --   --   --   ALBUMIN 4.1 2.6* 2.1* 2.3* 2.4*     CBG: Recent Labs  Lab 09/22/20 0802 09/22/20 1131 09/22/20 1655 09/22/20 2223 09/23/20 0713  GLUCAP 150* 171* 168* 199* 174*      Recent Results (from the past 240 hour(s))  Resp Panel by RT-PCR (Flu A&B, Covid) Nasopharyngeal Swab     Status: None   Collection Time: 09/17/20  9:50 PM   Specimen: Nasopharyngeal Swab; Nasopharyngeal(NP) swabs in vial transport medium  Result Value Ref Range Status   SARS Coronavirus 2 by RT PCR NEGATIVE NEGATIVE Final    Comment: (NOTE) SARS-CoV-2 target nucleic acids are NOT DETECTED.  The SARS-CoV-2 RNA is generally detectable in upper respiratory specimens during the acute phase of infection. The lowest concentration of SARS-CoV-2 viral copies this assay can detect is 138 copies/mL. A negative result does not preclude SARS-Cov-2 infection and should not be used as the sole basis for treatment or other patient management decisions. A negative result may occur with  improper specimen collection/handling, submission of specimen other than nasopharyngeal swab, presence of viral mutation(s) within the areas targeted by this assay, and inadequate number of viral copies(<138 copies/mL). A negative result must be combined with clinical observations, patient history, and epidemiological information. The expected result is Negative.  Fact Sheet for Patients:  BloggerCourse.comhttps://www.fda.gov/media/152166/download  Fact Sheet for Healthcare Providers:  SeriousBroker.ithttps://www.fda.gov/media/152162/download  This test is no t yet approved or cleared by the [place] FDA and  has been authorized for detection and/or diagnosis of SARS-CoV-2 by FDA under an Emergency Use Authorization (EUA). This EUA will remain  in effect (meaning this test can be used) for the duration of the COVID-19 declaration under Section 564(b)(1) of the Act, 21 U.S.C.section 360bbb-3(b)(1),  unless the authorization is terminated  or revoked sooner.       Influenza A by PCR NEGATIVE NEGATIVE Final   Influenza B by PCR NEGATIVE NEGATIVE Final    Comment: (NOTE) The Xpert Xpress SARS-CoV-2/FLU/RSV plus assay is intended as an aid in the diagnosis of influenza from Nasopharyngeal swab specimens and should not be used as a sole basis for treatment. Nasal washings and aspirates are unacceptable for Xpert Xpress SARS-CoV-2/FLU/RSV testing.  Fact Sheet for Patients: BloggerCourse.comhttps://www.fda.gov/media/152166/download  Fact Sheet for Healthcare Providers: SeriousBroker.ithttps://www.fda.gov/media/152162/download  This test is not yet approved  or cleared by the [place] and has been authorized for detection and/or diagnosis of SARS-CoV-2 by FDA under an Emergency Use Authorization (EUA). This EUA will remain in effect (meaning this test can be used) for the duration of the COVID-19 declaration under Section 564(b)(1) of the Act, 21 U.S.C. section 360bbb-3(b)(1), unless the authorization is terminated or revoked.  Performed at [facility], [address] [place], [place] [address]   MRSA Next Gen by PCR, Nasal     Status: Abnormal   Collection Time: 09/18/20  3:33 AM   Specimen: Nasal Mucosa; Nasal Swab  Result Value Ref Range Status   MRSA by PCR Next Gen DETECTED (A) NOT DETECTED Final    Comment: RESULT CALLED TO, READ BACK BY AND VERIFIED WITH: PAULINE, RN @ [phone] ON 09/18/20 C VARNER (NOTE) The GeneXpert MRSA Assay (FDA approved for NASAL specimens only), is one component of a comprehensive MRSA colonization surveillance program. It is not intended to diagnose MRSA infection nor to guide or monitor treatment for MRSA infections. Test performance is not FDA approved in patients less than 43 years old. Performed at [facility], [address] [place], [place] [address]           Radiology Studies: No results  found.      Scheduled Meds:  apixaban  5 mg Oral BID   bisacodyl  10 mg Rectal Once   Chlorhexidine Gluconate Cloth  6 each Topical Daily   diphenhydrAMINE  25 mg Oral BID   famotidine  20 mg Oral BID   folic acid  1 mg Oral Daily   insulin aspart  0-20 Units Subcutaneous TID WC   insulin aspart  0-5 Units Subcutaneous QHS   insulin glargine  26 Units Subcutaneous Q24H   multivitamin with minerals  1 tablet Oral Daily   nortriptyline  10 mg Oral Daily   nortriptyline  20 mg Oral QHS   pantoprazole  40 mg Oral Daily   sodium bicarbonate  650 mg Oral TID   thiamine  100 mg Oral Daily   Or   thiamine  100 mg Intravenous Daily   Continuous Infusions:  sodium chloride        LOS: 5 days    Time spent: 35 minutes    Ramiro Harvest, MD [facility]   To contact the attending provider between 7A-7P or the covering provider during after hours 7P-7A, please log into the web site [email or web address] and access using universal [place] password for that web site. If you do not have the password, please call the hospital operator.  09/23/2020, 11:06 AM

## 2020-09-23 NOTE — Evaluation (Signed)
Physical Therapy Evaluation Patient Details Name: Bryce Mendoza MRN: [id number] DOB: 05/17/1960 Today's Date: 09/23/2020   History of Present Illness  60 year old gentleman history of DVT and PE(2019) on Eliquis, hyperlipidemia, idiopathic severe polyneuropathy presented to [facility] with epigastric pain and transferred to [facility] for acute pancreatitis and new onset DM in DKA.  CT abdomen and pelvis done consistent with acute pancreatitis without pseudocyst or abscess formation, no evidence of necrosis or hemorrhage  Clinical Impression  Patient evaluated by Physical Therapy with no further acute PT needs identified. All education has been completed and the patient has no further questions.  See below for any follow-up Physical Therapy or equipment needs. PT is signing off. Thank you for this referral.  Pt amb ~ 180' with RW. He feels he is at his baseline. Would benefit from continued amb with nursing staff.     Follow Up Recommendations No PT follow up    Equipment Recommendations  None recommended by PT    Recommendations for Other Services       Precautions / Restrictions Precautions Precautions: Fall Restrictions Weight Bearing Restrictions: No      Mobility  Bed Mobility Overal bed mobility: Modified Independent             General bed mobility comments: HOB elevated, uses rail, no physical assist    Transfers   Equipment used: Rolling walker (2 wheeled) Transfers: Sit to/from Stand Sit to Stand: Supervision         General transfer comment: for safety, 2 attempts to come to full stand, no physical assist  Ambulation/Gait Ambulation/Gait assistance: Supervision;Modified independent (Device/Increase time) Gait Distance (Feet): 180 Feet Assistive device: Rolling walker (2 wheeled) Gait Pattern/deviations: Step-through pattern;Wide base of support;Decreased stride length     General Gait Details: initial supervision for  safety. no LOB. slight fatigue with distance  Stairs            Wheelchair Mobility    Modified Rankin (Stroke Patients Only)       Balance Overall balance assessment: Needs assistance         Standing balance support: No upper extremity supported Standing balance-Leahy Scale: Fair Standing balance comment: reliant on UEs for dynamic                             Pertinent Vitals/Pain Pain Assessment: Faces Faces Pain Scale: Hurts little more Pain Location: abd Pain Descriptors / Indicators: Grimacing;Sore Pain Intervention(s): Premedicated before session;Monitored during session;Limited activity within patient's tolerance    Home Living Family/patient expects to be discharged to:: Private residence Living Arrangements: Alone Available Help at Discharge: Available PRN/intermittently Type of Home: House Home Access: Level entry     Home Layout: Two level Home Equipment: Environmental consultant - 2 wheels;Cane - single point      Prior Function Level of Independence: Independent with assistive device(s)         Comments: has been staying downstairs--sleeps on sofa, amb with RW or uses w/c for longer distances (to mailbox)     Hand Dominance        Extremity/Trunk Assessment   Upper Extremity Assessment Upper Extremity Assessment: Overall WFL for tasks assessed    Lower Extremity Assessment Lower Extremity Assessment: Overall WFL for tasks assessed       Communication   Communication: No difficulties  Cognition Arousal/Alertness: Awake/alert Behavior During Therapy: WFL for tasks assessed/performed Overall Cognitive Status: Within Functional Limits  for tasks assessed                                        General Comments      Exercises     Assessment/Plan    PT Assessment Patent does not need any further PT services  PT Problem List         PT Treatment Interventions      PT Goals (Current goals can be found in the  Care Plan section)  Acute Rehab PT Goals Patient Stated Goal: less pain PT Goal Formulation: All assessment and education complete, DC therapy Time For Goal Achievement: 10/07/20 Potential to Achieve Goals: Good    Frequency     Barriers to discharge        Co-evaluation               AM-PAC PT "6 Clicks" Mobility  Outcome Measure Help needed turning from your back to your side while in a flat bed without using bedrails?: A Little Help needed moving from lying on your back to sitting on the side of a flat bed without using bedrails?: A Little Help needed moving to and from a bed to a chair (including a wheelchair)?: None Help needed standing up from a chair using your arms (e.g., wheelchair or bedside chair)?: None Help needed to walk in hospital room?: None Help needed climbing 3-5 steps with a railing? : A Little 6 Click Score: 21    End of Session Equipment Utilized During Treatment: Gait belt Activity Tolerance: Patient tolerated treatment well Patient left: with call bell/phone within reach;in bed;with bed alarm set   PT Visit Diagnosis: Other abnormalities of gait and mobility (R26.89)    Time: 6759-1638 PT Time Calculation (min) (ACUTE ONLY): 13 min   Charges:   PT Evaluation $PT Eval Low Complexity: 1 Low          Shade Rivenbark, PT  Acute Rehab Dept ([facility]) [phone] Pager [phone]  09/23/2020   [facility] 09/23/2020, 4:00 PM

## 2020-09-23 NOTE — Plan of Care (Signed)
  Problem: Education: Goal: Knowledge of General Education information will improve Description Including pain rating scale, medication(s)/side effects and non-pharmacologic comfort measures Outcome: Progressing   

## 2020-09-23 NOTE — Progress Notes (Signed)
Patient transferred to 1337.

## 2020-09-24 ENCOUNTER — Inpatient Hospital Stay (HOSPITAL_COMMUNITY): Payer: Medicaid Other

## 2020-09-24 DIAGNOSIS — G609 Hereditary and idiopathic neuropathy, unspecified: Secondary | ICD-10-CM | POA: Diagnosis not present

## 2020-09-24 DIAGNOSIS — R140 Abdominal distension (gaseous): Secondary | ICD-10-CM

## 2020-09-24 DIAGNOSIS — E0810 Diabetes mellitus due to underlying condition with ketoacidosis without coma: Secondary | ICD-10-CM | POA: Diagnosis not present

## 2020-09-24 DIAGNOSIS — R030 Elevated blood-pressure reading, without diagnosis of hypertension: Secondary | ICD-10-CM | POA: Diagnosis not present

## 2020-09-24 DIAGNOSIS — K8500 Idiopathic acute pancreatitis without necrosis or infection: Secondary | ICD-10-CM | POA: Diagnosis not present

## 2020-09-24 LAB — BASIC METABOLIC PANEL
Anion gap: 10 (ref 5–15)
BUN: 14 mg/dL (ref 6–20)
CO2: 14 mmol/L — ABNORMAL LOW (ref 22–32)
Calcium: 8 mg/dL — ABNORMAL LOW (ref 8.9–10.3)
Chloride: 108 mmol/L (ref 98–111)
Creatinine, Ser: 0.76 mg/dL (ref 0.61–1.24)
GFR, Estimated: >60 mL/min (ref >60)
Glucose, Bld: 167 mg/dL — ABNORMAL HIGH (ref 70–99)
Potassium: 3.1 mmol/L — ABNORMAL LOW (ref 3.5–5.1)
Sodium: 132 mmol/L — ABNORMAL LOW (ref 135–145)

## 2020-09-24 LAB — GLUCOSE, CAPILLARY
Glucose-Capillary: 171 mg/dL — ABNORMAL HIGH (ref 70–99)
Glucose-Capillary: 164 mg/dL — ABNORMAL HIGH (ref 70–99)
Glucose-Capillary: 99 mg/dL (ref 70–99)
Glucose-Capillary: 125 mg/dL — ABNORMAL HIGH (ref 70–99)

## 2020-09-24 LAB — MAGNESIUM: Magnesium: 2.2 mg/dL (ref 1.7–2.4)

## 2020-09-24 MED ORDER — LOPERAMIDE HCL 2 MG PO CAPS: 2.0000 mg | ORAL_CAPSULE | ORAL | Status: DC | PRN

## 2020-09-24 MED ORDER — ENOXAPARIN SODIUM 150 MG/ML IJ SOSY
140.0000 mg | PREFILLED_SYRINGE | Freq: Two times a day (BID) | INTRAMUSCULAR | Status: DC
  Administered 2020-09-24 – 2020-09-29 (×10): 140 mg via SUBCUTANEOUS
  Filled 2020-09-24 (×12): qty 0.94

## 2020-09-24 MED ORDER — FAMOTIDINE 20 MG PO TABS
20.0000 mg | ORAL_TABLET | Freq: Every day | ORAL | Status: DC
  Administered 2020-09-24: 20 mg via ORAL
  Filled 2020-09-24: qty 1

## 2020-09-24 MED ORDER — LORAZEPAM 2 MG/ML IJ SOLN: 0.5000 mg | Freq: Three times a day (TID) | INTRAMUSCULAR | Status: DC | PRN

## 2020-09-24 MED ORDER — SODIUM BICARBONATE 8.4 % IV SOLN
INTRAVENOUS | Status: DC
  Filled 2020-09-24: qty 150
  Filled 2020-09-24: qty 1000
  Filled 2020-09-24: qty 150
  Filled 2020-09-24: qty 1000
  Filled 2020-09-24 (×4): qty 150

## 2020-09-24 MED ORDER — DIPHENHYDRAMINE HCL 25 MG PO CAPS
25.0000 mg | ORAL_CAPSULE | Freq: Every day | ORAL | Status: DC
  Administered 2020-09-24 – 2020-09-25 (×2): 25 mg via ORAL
  Filled 2020-09-24 (×2): qty 1

## 2020-09-24 MED ORDER — FAMOTIDINE IN NACL 20-0.9 MG/50ML-% IV SOLN
20.0000 mg | INTRAVENOUS | Status: DC
  Administered 2020-09-24: 20 mg via INTRAVENOUS
  Filled 2020-09-24 (×2): qty 50

## 2020-09-24 MED ORDER — PANTOPRAZOLE SODIUM 40 MG IV SOLR
40.0000 mg | INTRAVENOUS | Status: DC
  Administered 2020-09-24 – 2020-09-25 (×2): 40 mg via INTRAVENOUS
  Filled 2020-09-24 (×2): qty 40

## 2020-09-24 MED ORDER — SODIUM CHLORIDE 0.9 % IV SOLN
1.0000 mg | Freq: Every day | INTRAVENOUS | Status: DC
  Administered 2020-09-25 – 2020-09-26 (×2): 1 mg via INTRAVENOUS
  Filled 2020-09-24 (×3): qty 0.2

## 2020-09-24 MED ORDER — POTASSIUM CHLORIDE CRYS ER 20 MEQ PO TBCR
40.0000 meq | EXTENDED_RELEASE_TABLET | ORAL | Status: AC
Start: 2020-09-24 — End: 2020-09-24
  Administered 2020-09-24 (×2): 40 meq via ORAL
  Filled 2020-09-24 (×2): qty 2

## 2020-09-24 NOTE — Progress Notes (Signed)
PROGRESS NOTE    Bryce Mendoza  UXN:[id number] DOB: 1960/06/13 DOA: 09/17/2020 PCP: Claiborne Rigg, NP    Chief Complaint  Patient presents with   Abdominal Pain    Brief Narrative: Patient 60 year old gentleman history of DVT and PE(2019) on Eliquis, hyperlipidemia, idiopathic severe polyneuropathy presented to [facility] with epigastric pain and transferred to [facility] long hospital for acute pancreatitis and new onset DM in DKA.  CT abdomen and pelvis done consistent with acute pancreatitis without pseudocyst or abscess formation, no evidence of necrosis or hemorrhage seen.  Patient noted on admission to have a lipase of 2959, markedly hyperglycemic with a anion gap concerning for DKA.  Patient also noted to have a substantial lactic acidosis of 6.7.  Patient aggressively hydrated with IV fluids, placed on the insulin drip, placed on bowel rest, pain management.   Assessment & Plan:   Principal Problem:   Acute pancreatitis Active Problems:   Idiopathic polyneuropathy   DKA (diabetic ketoacidosis) (HCC)   Mixed hyperlipidemia   Lactic acidosis   Elevated BP without diagnosis of hypertension   Alcohol dependence with unspecified alcohol-induced disorder (HCC)   Hypomagnesemia   Hypophosphatemia   1 acute pancreatitis -?  Etiology. -Patient presented with 2-day history of severe abdominal pain, markedly elevated lipase levels, CT abdomen and pelvis consistent with acute pancreatitis without evidence of abscess formation, necrosis or hemorrhage. -Patient initially denied to admitting physician any alcohol use or illicit drug use however admitted to drinking bottles of wine 2-3 times a week. -Fasting lipid panel with a total cholesterol of 249, LDL of 163, triglycerides of 180. -Right upper quadrant ultrasound done with no gallstones or biliary distention, increased hepatic echogenicity consistent with fatty infiltration and/or hepatocellular disease. -Clinical  improvement. -Lipase levels trended down  at 41 from 211 from 1095 from 2959.   -Saline lock IV fluids. -Tolerated soft diet. -Pain management, supportive care.  2.  New onset diabetes mellitus/DKA -Patient presented with no known history of diabetes. -Patient noted to be in DKA with the severe hypoglycemia, anion gap. -Hemoglobin A1c 9.4.   -Patient has been transitioned off glucose stabilizer/insulin drip and currently on subcutaneous Lantus.   -Patient with bicarb of 14 on blood work this morning however afebrile, normal anion gap. -Discontinue bicarb tablets and placed on bicarb drip.   -CBG 171 this morning.   -Continue Lantus 26 units daily, SSI.   -Diabetic coordinator following.    3.  Hypomagnesemia/hypophosphatemia/hypokalemia -Potassium at 3.1, phosphorus at 3.5, magnesium at 2.2. -K-Dur 40 mEq p.o. every 4 hours x2 doses. -Check labs in the a.m.  4.  Lactic acidosis/NAGMA -Patient presented with severe lactic acidosis likely secondary to severe volume depletion. -Lactic acid levels improved with hydration. -Patient now with NAGMA, likely secondary to GI losses from multiple watery loose stools. -DC oral bicarb tablets and placed on a bicarb drip. -Abdominal films to further assess abdominal distention. -Imodium as needed.  5.  Mixed hyperlipidemia -Continue statin.  6.  Idiopathic polyneuropathy -Patient follows outpatient neurology at [facility]. -Continue home regimen nortriptyline nightly for associated neuropathic pain. -Outpatient follow-up.  7.  Elevated blood pressure without diagnosis of hypertension -Secondary to severe pain.   -Improved.   -Patient with no prior history of hypertension.   -IV hydralazine as needed.   -Outpatient follow-up.   8.  Morbid obesity  9.  Leukocytosis -Likely reactive leukocytosis secondary to acute pancreatitis. -Afebrile. -Urinalysis unremarkable.   -No respiratory symptoms.   -Leukocytosis resolved. -No need  for  antibiotics at this time.  10.  Alcohol use -It is noted per admitting physician that patient denied any alcohol use however patient admits to drinking 2 bottles of wine 2 or 3 times a week. -No signs of withdrawal noted.  -Ativan withdrawal protocol completed. -Place on Ativan as needed.  Continue thiamine, folic acid, multivitamin.    11.  History of DVT/PE -Eliquis.  12.  Constipation -Patient stated had multiple bowel movement after fleets enema and Dulcolax suppository.   -MiraLAX as needed.   -Patient now with multiple bowel movements and on Imodium as needed.  13.  Rash -Per RN patient developed a rash on lower abdominal wall and lower extremities overnight that improved with Benadryl. -??  Etiology. -Improved.   -Decrease Pepcid to daily, decrease Benadryl to daily.  Status post prednisone 40 mg IV x1.    #14 abdominal distention -Patient with diffuse abdominal pain, abdomen is distended today with hypoactive bowel sounds. -Patient stated still with multiple loose watery stools overnight. -Patient denies any nausea or emesis. -Concern for possible adynamic ileus versus SBO. -Check abdominal films.     DVT prophylaxis: Lovenox >>>Eliquis Code Status: Full Family Communication: Updated patient.  No family at bedside. Disposition:   Status is: Inpatient  Remains inpatient appropriate because:Inpatient level of care appropriate due to severity of illness  Dispo: The patient is from: Home              Anticipated d/c is to: Home              Patient currently is not medically stable to d/c.   Difficult to place patient No       Consultants:  None  Procedures:  CT abdomen and pelvis 09/17/2020 Right upper quadrant ultrasound 09/18/2020  Antimicrobials: None   Subjective: Multiple loose stools overnight, and 2 loose stools this morning. No nausea or emesis, diffuse abd pain.  No chest pain.  No shortness of breath.  States was up all night and as such  sleepy/tired this morning.   Objective: Vitals:   09/23/20 0613 09/23/20 1349 09/24/20 0154 09/24/20 0616  BP: 117/83 128/85 (!) 138/91 (!) 137/93  Pulse: 85 80 99 79  Resp: 16 18 20 13   Temp: 97.6 F (36.4 C)  98.2 F (36.8 C) 97.6 F (36.4 C)  TempSrc: Oral  Oral Oral  SpO2: 97% 100% 98% 98%  Weight:      Height:        Intake/Output Summary (Last 24 hours) at 09/24/2020 1039 Last data filed at 09/24/2020 1014 Gross per 24 hour  Intake 1590.97 ml  Output 150 ml  Net 1440.97 ml    Filed Weights   09/17/20 1913 09/18/20 0402 09/23/20 0052  Weight: 122.5 kg 117.9 kg (!) 144.2 kg    Examination:  General exam: : NAD Respiratory system: Clear to auscultation bilaterally anterior lung fields.  No wheezes, no crackles, no rhonchi.  Speaking in full sentences.  Normal respiratory effort.  Cardiovascular system: RRR no murmurs rubs or gallops.  No JVD.  No lower extremity edema.  Gastrointestinal system: Abdomen distended, hyperactive bowel sounds, some diffuse tenderness to palpation, no rebound, no guarding.  Central nervous system: Alert and oriented.  Moving extremities spontaneously.  No focal neurological deficits. Extremities: Symmetric 5 x 5 power. Skin: No rashes, lesions or ulcers Psychiatry: Judgement and insight appear normal. Mood & affect appropriate.  Data Reviewed: I have personally reviewed following labs and imaging studies  CBC: Recent Labs  Lab 09/17/20 1918 09/17/20 2203 09/19/20 0251 09/20/20 0238 09/21/20 0247 09/22/20 0146  WBC 22.4*  --  24.8* 21.1* 11.5* 8.0  NEUTROABS  --   --   --  18.4* 8.4*  --   HGB 14.9 15.6 15.4 14.6 13.0 13.7  HCT 41.4 46.0 42.4 42.7 37.5* 39.5  MCV 104.3*  --  104.4* 108.9* 107.4* 107.6*  PLT 171  --  79* 82* 95* 116*     Basic Metabolic Panel: Recent Labs  Lab 09/19/20 0813 09/20/20 0238 09/21/20 0247 09/22/20 0146 09/23/20 0323 09/24/20 0311  NA 132* 132* 131* 132* 132* 132*  K 3.9 3.7 3.1* 3.3* 3.6  3.1*  CL 100 99 101 102 107 108  CO2 22 20* 20* 20* 15* 14*  GLUCOSE 169* 247* 203* 170* 198* 167*  BUN 12 17 20 18 14 14   CREATININE 0.75 0.92 0.73 0.82 0.79 0.76  CALCIUM 8.0* 7.9* 7.3* 7.4* 7.8* 8.0*  MG 1.3* 2.2 2.2 2.1 2.3 2.2  PHOS 2.1*  --  2.3* 2.7 3.5  --      GFR: Estimated Creatinine Clearance: 150.3 mL/min (by C-G formula based on SCr of 0.76 mg/dL).  Liver Function Tests: Recent Labs  Lab 09/17/20 1918 09/19/20 0813 09/21/20 0247 09/22/20 0146 09/23/20 0323  AST 39 40  --   --   --   ALT 50* 26  --   --   --   ALKPHOS 80 54  --   --   --   BILITOT 0.7 2.4*  --   --   --   PROT 7.4 5.4*  --   --   --   ALBUMIN 4.1 2.6* 2.1* 2.3* 2.4*     CBG: Recent Labs  Lab 09/23/20 0713 09/23/20 1133 09/23/20 1641 09/23/20 2145 09/24/20 0733  GLUCAP 174* 196* 152* 136* 171*      Recent Results (from the past 240 hour(s))  Resp Panel by RT-PCR (Flu A&B, Covid) Nasopharyngeal Swab     Status: None   Collection Time: 09/17/20  9:50 PM   Specimen: Nasopharyngeal Swab; Nasopharyngeal(NP) swabs in vial transport medium  Result Value Ref Range Status   SARS Coronavirus 2 by RT PCR NEGATIVE NEGATIVE Final    Comment: (NOTE) SARS-CoV-2 target nucleic acids are NOT DETECTED.  The SARS-CoV-2 RNA is generally detectable in upper respiratory specimens during the acute phase of infection. The lowest concentration of SARS-CoV-2 viral copies this assay can detect is 138 copies/mL. A negative result does not preclude SARS-Cov-2 infection and should not be used as the sole basis for treatment or other patient management decisions. A negative result may occur with  improper specimen collection/handling, submission of specimen other than nasopharyngeal swab, presence of viral mutation(s) within the areas targeted by this assay, and inadequate number of viral copies(<138 copies/mL). A negative result must be combined with clinical observations, patient history, and  epidemiological information. The expected result is Negative.  Fact Sheet for Patients:  BloggerCourse.comhttps://www.fda.gov/media/152166/download  Fact Sheet for Healthcare Providers:  SeriousBroker.ithttps://www.fda.gov/media/152162/download  This test is no t yet approved or cleared by the [place] FDA and  has been authorized for detection and/or diagnosis of SARS-CoV-2 by FDA under an Emergency Use Authorization (EUA). This EUA will remain  in effect (meaning this test can be used) for the duration of the COVID-19 declaration under Section 564(b)(1) of the Act, 21 U.S.C.section 360bbb-3(b)(1), unless the authorization is terminated  or revoked sooner.       Influenza A by  PCR NEGATIVE NEGATIVE Final   Influenza B by PCR NEGATIVE NEGATIVE Final    Comment: (NOTE) The Xpert Xpress SARS-CoV-2/FLU/RSV plus assay is intended as an aid in the diagnosis of influenza from Nasopharyngeal swab specimens and should not be used as a sole basis for treatment. Nasal washings and aspirates are unacceptable for Xpert Xpress SARS-CoV-2/FLU/RSV testing.  Fact Sheet for Patients: BloggerCourse.com  Fact Sheet for Healthcare Providers: SeriousBroker.it  This test is not yet approved or cleared by the [place] FDA and has been authorized for detection and/or diagnosis of SARS-CoV-2 by FDA under an Emergency Use Authorization (EUA). This EUA will remain in effect (meaning this test can be used) for the duration of the COVID-19 declaration under Section 564(b)(1) of the Act, 21 U.S.C. section 360bbb-3(b)(1), unless the authorization is terminated or revoked.  Performed at [facility], [address], [place], [place] [address]   MRSA Next Gen by PCR, Nasal     Status: Abnormal   Collection Time: 09/18/20  3:33 AM   Specimen: Nasal Mucosa; Nasal Swab  Result Value Ref Range Status   MRSA by PCR Next Gen DETECTED (A) NOT DETECTED Final     Comment: RESULT CALLED TO, READ BACK BY AND VERIFIED WITH: PAULINE, RN @ [phone] ON 09/18/20 C VARNER (NOTE) The GeneXpert MRSA Assay (FDA approved for NASAL specimens only), is one component of a comprehensive MRSA colonization surveillance program. It is not intended to diagnose MRSA infection nor to guide or monitor treatment for MRSA infections. Test performance is not FDA approved in patients less than 58 years old. Performed at [facility], [address], [place], [place] [address]           Radiology Studies: No results found.      Scheduled Meds:  apixaban  5 mg Oral BID   bisacodyl  10 mg Rectal Once   Chlorhexidine Gluconate Cloth  6 each Topical Daily   diphenhydrAMINE  25 mg Oral Daily   famotidine  20 mg Oral Daily   folic acid  1 mg Oral Daily   insulin aspart  0-20 Units Subcutaneous TID WC   insulin aspart  0-5 Units Subcutaneous QHS   insulin glargine  26 Units Subcutaneous Q24H   multivitamin with minerals  1 tablet Oral Daily   nortriptyline  10 mg Oral Daily   nortriptyline  20 mg Oral QHS   pantoprazole  40 mg Oral Daily   potassium chloride  40 mEq Oral Q4H   thiamine  100 mg Oral Daily   Or   thiamine  100 mg Intravenous Daily   Continuous Infusions:   sodium bicarbonate (isotonic) infusion in sterile water 125 mL/hr at 09/24/20 1014      LOS: 6 days    Time spent: 40 minutes    Ramiro Harvest, MD [facility]   To contact the attending provider between 7A-7P or the covering provider during after hours 7P-7A, please log into the web site [email or web address] and access using universal [place] password for that web site. If you do not have the password, please call the hospital operator.  09/24/2020, 10:39 AM

## 2020-09-24 NOTE — Progress Notes (Signed)
Pt alert and aware in bed. Pt states he wanted to update his AD paperwork..  The chaplain offered caring and supportive presence, we helped pt to fill out the paperwork, arrange for witnesses and notary.

## 2020-09-24 NOTE — Evaluation (Signed)
Occupational Therapy Evaluation Patient Details Name: Bryce Mendoza MRN: [id number] DOB: 1961-02-20 Today's Date: 09/24/2020    History of Present Illness patietn is a 60 year old gentleman history of DVT and PE(2019) on Eliquis, hyperlipidemia, idiopathic severe polyneuropathy presented to [facility] with epigastric pain and transferred to [facility] long hospital for acute pancreatitis and new onset DM in DKA.  CT abdomen and pelvis done consistent with acute pancreatitis without pseudocyst or abscess formation, no evidence of necrosis or hemorrhage   Clinical Impression   Patient evaluated by Occupational Therapy with no further acute OT needs identified. All education has been completed and the patient has no further questions. Patient reported being at functional baseline with current ROM of bilateral hands/digits. Patient reported having deficits with buttons. Patient was educated on button hook. Patient verbalized understanding. Patient completed bed mobility and transfers in room with MI with rolling walker.  See below for any follow-up Occupational Therapy or equipment needs. OT is signing off.     Follow Up Recommendations  No OT follow up    Equipment Recommendations  None recommended by OT    Recommendations for Other Services       Precautions / Restrictions Restrictions Weight Bearing Restrictions: No      Mobility Bed Mobility Overal bed mobility: Modified Independent             General bed mobility comments: HOB elevated, uses rail, no physical assist    Transfers   Equipment used: Rolling walker (2 wheeled) Transfers: Sit to/from [employer] Sit to Stand: Modified independent (Device/Increase time) Stand pivot transfers: Modified independent (Device/Increase time)            Balance   Sitting-balance support: Feet supported Sitting balance-Leahy Scale: Good     Standing balance support: No upper extremity  supported Standing balance-Leahy Scale: Fair                             ADL either performed or assessed with clinical judgement   ADL Overall ADL's : At baseline                                       General ADL Comments: patient was able to complete toiileting tasks with MI with RW on this date. patient was educated on using button hook for buttons at home. patient verbalized understanding. patient reported being at baseline for ADLs at this time.     Vision   Vision Assessment?: No apparent visual deficits     Perception     Praxis      Pertinent Vitals/Pain Pain Assessment: Faces Faces Pain Scale: Hurts little more Pain Location: abdomen Pain Descriptors / Indicators: Grimacing;Sore Pain Intervention(s): Limited activity within patient's tolerance;Monitored during session     Hand Dominance Right   Extremity/Trunk Assessment Upper Extremity Assessment Upper Extremity Assessment: Overall WFL for tasks assessed (patients bilateral shoulders and  elbows WFL. patient was noted to have dupuytren's contractures bilaterally.)   Lower Extremity Assessment Lower Extremity Assessment: Defer to PT evaluation   Cervical / Trunk Assessment Cervical / Trunk Assessment: Normal   Communication Communication Communication: No difficulties   Cognition Arousal/Alertness: Awake/alert Behavior During Therapy: WFL for tasks assessed/performed Overall Cognitive Status: Within Functional Limits for tasks assessed  General Comments       Exercises     Shoulder Instructions      Home Living Family/patient expects to be discharged to:: Private residence Living Arrangements: Alone Available Help at Discharge: Available PRN/intermittently Type of Home: House Home Access: Level entry     Home Layout: Two level Alternate Level Stairs-Number of Steps: flight Alternate Level Stairs-Rails:  Left Bathroom Shower/Tub: Tub/shower unit;Curtain   Bathroom Toilet: Standard     Home Equipment: Environmental consultant - 2 wheels;Cane - single point          Prior Functioning/Environment Level of Independence: Independent with assistive device(s)        Comments: has been staying downstairs--sleeps on sofa, amb with RW or uses w/c for longer distances (to [employer])        OT Problem List:        OT Treatment/Interventions:      OT Goals(Current goals can be found in the care plan section)    OT Frequency:     Barriers to D/C:            Co-evaluation              AM-PAC OT "6 Clicks" Daily Activity     Outcome Measure Help from another person eating meals?: None Help from another person taking care of personal grooming?: None Help from another person toileting, which includes using toliet, bedpan, or urinal?: None Help from another person bathing (including washing, rinsing, drying)?: None Help from another person to put on and taking off regular upper body clothing?: None Help from another person to put on and taking off regular lower body clothing?: None 6 Click Score: 24   End of Session Equipment Utilized During Treatment: Rolling walker Nurse Communication: Other (comment) (nurse cleared patient to participate)  Activity Tolerance: Patient tolerated treatment well Patient left: in bed;with call bell/phone within reach  OT Visit Diagnosis: Muscle weakness (generalized) (M62.81)                Time: 1194-1740 OT Time Calculation (min): 10 min Charges:  OT General Charges $OT Visit: 1 Visit OT Evaluation $OT Eval Low Complexity: 1 Low  Bryce Mendoza OTR/L, MS Acute Rehabilitation Department Office# [phone] Pager# [phone]   Bryce Mendoza 09/24/2020, 12:01 PM

## 2020-09-24 NOTE — Plan of Care (Signed)
  Problem: Activity: Goal: Risk for activity intolerance will decrease Outcome: Progressing   Problem: Nutrition: Goal: Adequate nutrition will be maintained Outcome: Progressing   Problem: Coping: Goal: Level of anxiety will decrease Outcome: Progressing   Problem: Pain Managment: Goal: General experience of comfort will improve Outcome: Progressing   

## 2020-09-24 NOTE — Plan of Care (Signed)
  Problem: Health Behavior/Discharge Planning: Goal: Ability to manage health-related needs will improve Outcome: Progressing   Problem: Clinical Measurements: Goal: Ability to maintain clinical measurements within normal limits will improve Outcome: Progressing   Problem: Activity: Goal: Risk for activity intolerance will decrease Outcome: Progressing   Problem: Nutrition: Goal: Adequate nutrition will be maintained Outcome: Progressing   Problem: Elimination: Goal: Will not experience complications related to bowel motility Outcome: Progressing   Problem: Pain Managment: Goal: General experience of comfort will improve Outcome: Progressing   Problem: Safety: Goal: Ability to remain free from injury will improve Outcome: Progressing   

## 2020-09-24 NOTE — Progress Notes (Signed)
ANTICOAGULATION CONSULT NOTE   Pharmacy Consult for lovenox Indication: hx pulmonary embolus and DVT  No Known Allergies  Patient Measurements: weight 141 kg on 7/25 --> pt's RN weighed pt standing on the scale  Heparin Dosing Weight:   Vital Signs: Temp: 99 F (37.2 C) (07/25 1252) Temp Source: Oral (07/25 1252) BP: 124/87 (07/25 1227) Pulse Rate: 84 (07/25 1252)  Labs: Recent Labs    09/22/20 0146 09/23/20 0323 09/24/20 0311  HGB 13.7  --   --   HCT 39.5  --   --   PLT 116*  --   --   CREATININE 0.82 0.79 0.76    Estimated Creatinine Clearance: 150.3 mL/min (by C-G formula based on SCr of 0.76 mg/dL).   Medications:  - on Eliquis 5mg  bid PTA  Assessment: Patient is a 60 y.o M with hx DVT/PE on Eliquis PTA, presented to the ED on 7/18 with c/o abdominal pain. Abdominal CT on 7/18 showed findings consistent with acute pancreatitis.  He was transitioned to lovenox on admission and changed back to Eliquis on 7/22.  Pharmacy has been consulted on 7/25 to transition patient back to lovenox.  - last dose of Eliquis taken on 7/25 at 0900 - weight = 141 kg on 7/25 --> pt's RN weighed pt standing on the scale  Goal of Therapy:  Anti-Xa level 0.6-1 units/ml 4hrs after LMWH dose given Monitor platelets by anticoagulation protocol: Yes   Plan:  - lovenox 140 mg SQ q12h - cbc q72h - monitor for s/sx bleeding  Jerry Clyne P 09/24/2020,7:59 PM

## 2020-09-25 ENCOUNTER — Inpatient Hospital Stay (HOSPITAL_COMMUNITY): Payer: Medicaid Other

## 2020-09-25 DIAGNOSIS — R140 Abdominal distension (gaseous): Secondary | ICD-10-CM

## 2020-09-25 DIAGNOSIS — G609 Hereditary and idiopathic neuropathy, unspecified: Secondary | ICD-10-CM | POA: Diagnosis not present

## 2020-09-25 DIAGNOSIS — K8500 Idiopathic acute pancreatitis without necrosis or infection: Secondary | ICD-10-CM | POA: Diagnosis not present

## 2020-09-25 DIAGNOSIS — E872 Acidosis, unspecified: Secondary | ICD-10-CM

## 2020-09-25 DIAGNOSIS — E8721 Acute metabolic acidosis: Secondary | ICD-10-CM

## 2020-09-25 DIAGNOSIS — E0810 Diabetes mellitus due to underlying condition with ketoacidosis without coma: Secondary | ICD-10-CM | POA: Diagnosis not present

## 2020-09-25 DIAGNOSIS — R030 Elevated blood-pressure reading, without diagnosis of hypertension: Secondary | ICD-10-CM | POA: Diagnosis not present

## 2020-09-25 DIAGNOSIS — K5900 Constipation, unspecified: Secondary | ICD-10-CM

## 2020-09-25 LAB — MAGNESIUM: Magnesium: 1.9 mg/dL (ref 1.7–2.4)

## 2020-09-25 LAB — CBC
HCT: 38.6 % — ABNORMAL LOW (ref 39.0–52.0)
Hemoglobin: 12.9 g/dL — ABNORMAL LOW (ref 13.0–17.0)
MCH: 36.1 pg — ABNORMAL HIGH (ref 26.0–34.0)
MCHC: 33.4 g/dL (ref 30.0–36.0)
MCV: 108.1 fL — ABNORMAL HIGH (ref 80.0–100.0)
Platelets: 329 10*3/uL (ref 150–400)
RBC: 3.57 MIL/uL — ABNORMAL LOW (ref 4.22–5.81)
RDW: 13.2 % (ref 11.5–15.5)
WBC: 22 10*3/uL — ABNORMAL HIGH (ref 4.0–10.5)
nRBC: 0.2 % (ref 0.0–0.2)

## 2020-09-25 LAB — RENAL FUNCTION PANEL
Albumin: 2.4 g/dL — ABNORMAL LOW (ref 3.5–5.0)
Anion gap: 13 (ref 5–15)
BUN: 10 mg/dL (ref 6–20)
CO2: 20 mmol/L — ABNORMAL LOW (ref 22–32)
Calcium: 8.1 mg/dL — ABNORMAL LOW (ref 8.9–10.3)
Chloride: 104 mmol/L (ref 98–111)
Creatinine, Ser: 0.85 mg/dL (ref 0.61–1.24)
GFR, Estimated: >60 mL/min (ref >60)
Glucose, Bld: 108 mg/dL — ABNORMAL HIGH (ref 70–99)
Phosphorus: 3.2 mg/dL (ref 2.5–4.6)
Potassium: 2.9 mmol/L — ABNORMAL LOW (ref 3.5–5.1)
Sodium: 137 mmol/L (ref 135–145)

## 2020-09-25 LAB — GLUCOSE, CAPILLARY
Glucose-Capillary: 104 mg/dL — ABNORMAL HIGH (ref 70–99)
Glucose-Capillary: 60 mg/dL — ABNORMAL LOW (ref 70–99)
Glucose-Capillary: 62 mg/dL — ABNORMAL LOW (ref 70–99)
Glucose-Capillary: 93 mg/dL (ref 70–99)
Glucose-Capillary: 98 mg/dL (ref 70–99)

## 2020-09-25 LAB — MRSA NEXT GEN BY PCR, NASAL: MRSA by PCR Next Gen: DETECTED — AB

## 2020-09-25 MED ORDER — CHLORHEXIDINE GLUCONATE 0.12 % MT SOLN
15.0000 mL | Freq: Two times a day (BID) | OROMUCOSAL | Status: DC
  Administered 2020-09-25 – 2020-10-09 (×28): 15 mL via OROMUCOSAL
  Filled 2020-09-25 (×28): qty 15

## 2020-09-25 MED ORDER — INSULIN GLARGINE 100 UNIT/ML ~~LOC~~ SOLN
12.0000 [IU] | SUBCUTANEOUS | Status: DC
  Administered 2020-09-25 – 2020-09-28 (×3): 12 [IU] via SUBCUTANEOUS
  Filled 2020-09-25 (×4): qty 0.12

## 2020-09-25 MED ORDER — DEXTROSE 50 % IV SOLN
INTRAVENOUS | Status: AC
  Administered 2020-09-25: 25 mL
  Filled 2020-09-25: qty 50

## 2020-09-25 MED ORDER — IOHEXOL 9 MG/ML PO SOLN
500.0000 mL | ORAL | Status: AC
  Administered 2020-09-25 (×2): 500 mL via ORAL

## 2020-09-25 MED ORDER — DEXTROSE 50 % IV SOLN: 12.5000 g | INTRAVENOUS | Status: AC

## 2020-09-25 MED ORDER — POTASSIUM CHLORIDE 10 MEQ/100ML IV SOLN
10.0000 meq | INTRAVENOUS | Status: AC
  Administered 2020-09-25 (×2): 10 meq via INTRAVENOUS
  Filled 2020-09-25 (×2): qty 100

## 2020-09-25 MED ORDER — POTASSIUM CHLORIDE 10 MEQ/100ML IV SOLN
10.0000 meq | INTRAVENOUS | Status: AC
  Administered 2020-09-25 (×3): 10 meq via INTRAVENOUS
  Filled 2020-09-25 (×3): qty 100

## 2020-09-25 MED ORDER — INSULIN ASPART 100 UNIT/ML IJ SOLN
0.0000 [IU] | Freq: Four times a day (QID) | INTRAMUSCULAR | Status: DC
  Administered 2020-09-26 – 2020-09-30 (×8): 3 [IU] via SUBCUTANEOUS
  Administered 2020-09-30 (×2): 4 [IU] via SUBCUTANEOUS
  Administered 2020-10-01 (×2): 3 [IU] via SUBCUTANEOUS
  Administered 2020-10-01: 4 [IU] via SUBCUTANEOUS
  Administered 2020-10-02 – 2020-10-03 (×6): 3 [IU] via SUBCUTANEOUS
  Administered 2020-10-04: 4 [IU] via SUBCUTANEOUS
  Administered 2020-10-04 – 2020-10-05 (×2): 3 [IU] via SUBCUTANEOUS
  Administered 2020-10-05: 4 [IU] via SUBCUTANEOUS
  Administered 2020-10-06 (×3): 3 [IU] via SUBCUTANEOUS
  Administered 2020-10-06: 4 [IU] via SUBCUTANEOUS
  Administered 2020-10-07: 7 [IU] via SUBCUTANEOUS
  Administered 2020-10-07: 4 [IU] via SUBCUTANEOUS
  Administered 2020-10-07: 3 [IU] via SUBCUTANEOUS
  Administered 2020-10-07 – 2020-10-08 (×3): 4 [IU] via SUBCUTANEOUS
  Administered 2020-10-08: 3 [IU] via SUBCUTANEOUS
  Administered 2020-10-08: 4 [IU] via SUBCUTANEOUS
  Administered 2020-10-09: 3 [IU] via SUBCUTANEOUS
  Administered 2020-10-09: 4 [IU] via SUBCUTANEOUS

## 2020-09-25 MED ORDER — ORAL CARE MOUTH RINSE
15.0000 mL | Freq: Two times a day (BID) | OROMUCOSAL | Status: DC
Start: 2020-09-26 — End: 2020-10-01
  Administered 2020-09-26 – 2020-09-30 (×10): 15 mL via OROMUCOSAL

## 2020-09-25 MED ORDER — SODIUM CHLORIDE 0.9 % IV SOLN
2.0000 g | Freq: Three times a day (TID) | INTRAVENOUS | Status: DC
  Administered 2020-09-25 – 2020-09-26 (×3): 2 g via INTRAVENOUS
  Filled 2020-09-25 (×3): qty 2

## 2020-09-25 MED ORDER — SODIUM CHLORIDE 0.9 % IV BOLUS
1000.0000 mL | Freq: Once | INTRAVENOUS | Status: AC
  Administered 2020-09-25: 1000 mL via INTRAVENOUS

## 2020-09-25 MED ORDER — IOHEXOL 9 MG/ML PO SOLN
ORAL | Status: AC
  Filled 2020-09-25: qty 1000

## 2020-09-25 MED ORDER — IOHEXOL 350 MG/ML SOLN
100.0000 mL | Freq: Once | INTRAVENOUS | Status: AC | PRN
  Administered 2020-09-25: 80 mL via INTRAVENOUS

## 2020-09-25 MED ORDER — HYDROMORPHONE HCL 1 MG/ML IJ SOLN
1.0000 mg | INTRAMUSCULAR | Status: DC | PRN
Start: 2020-09-25 — End: 2020-09-30
  Administered 2020-09-26: 1 mg via INTRAVENOUS
  Administered 2020-09-26 – 2020-09-27 (×5): 2 mg via INTRAVENOUS
  Administered 2020-09-27: 1 mg via INTRAVENOUS
  Administered 2020-09-27 – 2020-09-30 (×12): 2 mg via INTRAVENOUS
  Filled 2020-09-25 (×8): qty 2
  Filled 2020-09-25: qty 1
  Filled 2020-09-25 (×9): qty 2
  Filled 2020-09-25: qty 1
  Filled 2020-09-25: qty 2

## 2020-09-25 NOTE — Progress Notes (Signed)
Report given, Pt is alert and oriented x 4, not in acute distressed, transferred to higher level of care.

## 2020-09-25 NOTE — Progress Notes (Signed)
Pharmacy Antibiotic Note  Bryce Mendoza is a 60 y.o. male presented to the ED on 09/17/2020 with c/o abdominal pain. Abdominal CT on 7/18 showed findings consistent with acute pancreatitis.  Pharmacy has been consulted to dose cefepime for severe pancreatitis.  Plan: - cefepime 2gm IV q8h  ______________________________________  Height: 6\' 1"  (185.4 cm) Weight: (!) 141.4 kg (311 lb 12.8 oz) (rechecked for pharmacy, standing scale) IBW/kg (Calculated) : 79.9  Temp (24hrs), Avg:98.3 F (36.8 C), Min:97.8 F (36.6 C), Max:98.9 F (37.2 C)  Recent Labs  Lab 09/19/20 0251 09/19/20 0813 09/20/20 0238 09/21/20 0247 09/22/20 0146 09/23/20 0323 09/24/20 0311 09/25/20 0344  WBC 24.8*  --  21.1* 11.5* 8.0  --   --  22.0*  CREATININE  --  0.75 0.92 0.73 0.82 0.79 0.76 0.85  LATICACIDVEN  --  2.1*  --   --   --   --   --   --     Estimated Creatinine Clearance: 140 mL/min (by C-G formula based on SCr of 0.85 mg/dL).    No Known Allergies   Thank you for allowing pharmacy to be a part of this patient's care.  09/27/20 09/25/2020 5:30 PM

## 2020-09-25 NOTE — Plan of Care (Signed)

## 2020-09-25 NOTE — Progress Notes (Signed)
Mobility Specialist - Progress Note     09/25/20 1112  Mobility  Activity Ambulated in hall  Level of Assistance Modified independent, requires aide device or extra time  Assistive Device Front wheel walker  Distance Ambulated (ft) 260 ft  Mobility Ambulated independently in hallway  Mobility Response Tolerated well  Mobility performed by Mobility specialist (11:50-11:10)  $Mobility charge 1 Mobility     Pt ambulated 260 ft in hallway using RW. Pt did not c/o of dizziness or pain, but did experience SOB at end of ambulation. Pt returned to EOB after session with call bell at side and awaiting RN for medication.    Arliss Journey Mobility Specialist Acute Rehabilitation Services Phone: [phone] 09/25/20, 11:15 AM

## 2020-09-25 NOTE — Progress Notes (Signed)
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  CT abdomen and pelvis done consistent with acute pancreatitis without pseudocyst or abscess formation, no evidence of necrosis or hemorrhage seen.  Patient noted on admission to have a lipase of 2959, markedly hyperglycemic with a anion gap concerning for DKA.  Patient also noted to have a substantial lactic acidosis of 6.7.  Patient aggressively hydrated with IV fluids, placed on the insulin drip, placed on bowel rest, pain management.   Assessment & Plan:   Principal Problem:   Acute pancreatitis Active Problems:   Idiopathic polyneuropathy   DKA (diabetic ketoacidosis) (HCC)   Mixed hyperlipidemia   Lactic acidosis   Elevated BP without diagnosis of hypertension   Alcohol dependence with unspecified alcohol-induced disorder (HCC)   Hypomagnesemia   Hypophosphatemia   1 acute severe pancreatitis -?  Etiology. -Patient presented with 2-day history of severe abdominal pain, markedly elevated lipase levels, CT abdomen and pelvis consistent with acute pancreatitis without evidence of abscess formation, necrosis or hemorrhage. -Patient initially denied to admitting physician any alcohol use or illicit drug use however admitted to drinking bottles of wine 2-3 times a week. -Fasting lipid panel with a total cholesterol of 249, LDL of 163, triglycerides of 180. -Right upper quadrant ultrasound done with no gallstones or biliary distention, increased hepatic echogenicity consistent with fatty infiltration and/or hepatocellular  disease. -Patient initially improved clinically however over the past 1 to 2 days patient has been having watery loose stools, now with distended abdomen, hypoactive bowel sounds, abdominal films done concerning for adynamic ileus. -General surgery was consulted and CT abdomen and pelvis ordered which was concerning for severe acute interstitial edematous pancreatitis likely transitioning into necrotic pancreatitis with early glandular and peripancreatic necrosis suspected.   -Patient noted to have a worsening leukocytosis of 22,000 today. -Afebrile. -NPO. -Given 1 L bolus of normal saline. -Increase IV fluids to 150 cc an hour. -Place empirically on IV cefepime. -Transfer to stepdown unit for closer monitoring. -Consult with GI for further evaluation and recommendations. -General surgery following and appreciate their input and recommendations.  -2.  New onset diabetes mellitus/DKA -Patient presented with no known history of diabetes. -Patient noted to be in DKA with the severe hypoglycemia, anion gap. -Hemoglobin A1c 9.4.   -Patient has been transitioned off glucose stabilizer/insulin drip and currently on subcutaneous Lantus.   -Patient with bicarb which is improving on bicarb drip.   -Patient with a worsening pancreatitis and adynamic ileus and as such has been made NPO.   -Change CBGs to every 6 hours.   -Decrease Lantus to 12 units daily.   -SSI.   -Diabetic coordinator following.    3.  Hypomagnesemia/hypophosphatemia/hypokalemia -Potassium at 2.9, magnesium at 1.9, phosphorus at 3.2.   -Replete potassium, electrolytes. -Repeat labs in the morning.  4.  Lactic acidosis/NAGMA -Patient presented with severe lactic acidosis likely secondary to severe volume depletion initially. -Lactic acid levels improved with hydration early on in the hospitalization. -Patient now with NAGMA, felt likely secondary to GI losses from multiple watery loose stools.  Patient now with a worsening  severe pancreatitis and  acidosis could likely be secondary to that. -Acidosis improved on bicarb drip. -Continue bicarb drip for another 24 hours and if acidosis remains corrected could likely place on normal saline. -Discontinue Imodium.  5.  Mixed hyperlipidemia -Hold statin as patient NPO.  6.  Idiopathic polyneuropathy -Patient follows outpatient neurology at [facility]. -Continue home regimen nortriptyline nightly for associated neuropathic pain. -Outpatient follow-up.  7.  Elevated blood pressure without diagnosis of hypertension -Secondary to severe pain.   -Improved.   -Patient with no prior history of hypertension.   -IV hydralazine as needed.   -Outpatient follow-up.   8.  Morbid obesity  9.  Leukocytosis -Initially felt reactive secondary to acute pancreatitis.   -Leukocytosis initially resolved but has spiked back up and 22,000.  -Patient also noted to have adynamic ileus and repeat CT abdomen and pelvis with severe acute interstitial edematous pancreatitis likely transitioning into necrotic pancreatitis with early glandular and peripancreatic necrosis suspected.  -Place empirically on IV cefepime. -Follow.  10.  Alcohol use -It is noted per admitting physician that patient denied any alcohol use however patient admits to drinking 2 bottles of wine 2 or 3 times a week. -No signs of withdrawal noted.  -Ativan withdrawal protocol completed. -Continue IV Ativan as needed, thiamine, folic acid, multivitamin.   11.  History of DVT/PE -Discontinue Eliquis and placed back on Lovenox as patient now with severe worsening pancreatitis and adynamic ileus.  12.  Constipation -Patient stated had multiple bowel movement after fleets enema and Dulcolax suppository.   -MiraLAX as needed.   -Patient now with a adynamic ileus and worsening pancreatitis.  -Supportive care.   -Follow.    13.  Rash -Per RN patient developed a rash on lower abdominal wall and lower extremities overnight  that improved with Benadryl. -??  Etiology. -Improved.   -DC Pepcid and Benadryl after today's doses.   -Follow.    #14 abdominal distention -Patient with diffuse abdominal pain, abdomen noted to be distended with hypoactive bowel sounds.   -Patient noted to have had some multiple watery loose stools  -.  Patient denies any emesis.   -Abdominal films done concerning for adynamic ileus.   -Patient with no significant improvement and as such General surgery consulted CT abdomen and pelvis were ordered which were concerning for severe acute interstitial edematous pancreatitis likely transitioning into necrotic pancreatitis with early glandular and peripancreatic necrosis suspected.   -Patient made n.p.o. this morning.   -Increase IV fluids to 150 cc an hour.   -Place on IV cefepime.   -GI consult -General surgery consulted who assessed patient and are recommending GI evaluation.   -Supportive care.      DVT prophylaxis: Lovenox >>>Eliquis>>>> Lovenox Code Status: Full Family Communication: Updated patient.  No family at bedside. Disposition:   Status is: Inpatient  Remains inpatient appropriate because:Inpatient level of care appropriate due to severity of illness  Dispo: The patient is from: Home              Anticipated d/c is to: Home              Patient currently is not medically stable to d/c.   Difficult to place patient No       Consultants:  General surgery: Dr.Stechschulte 09/25/2020 GI: [facility] gastroenterology  Procedures:  CT abdomen and pelvis 09/17/2020, 09/25/2020 Right upper quadrant ultrasound 09/18/2020  Antimicrobials: None   Subjective: Patient had some loose stool overnight.  Still with diffuse abdominal pain.  Denies any emesis.  States  ambulated in the hallway with a rolling walker today.  No solid bowel movement.    Objective: Vitals:   09/24/20 2026 09/24/20 2041 09/24/20 2134 09/25/20 0958  BP:   119/87 (!) 102/54  Pulse:   93 87  Resp:    19 15  Temp:   98.9 F (37.2 C) 98.1 F (36.7 C)  TempSrc:   Oral Oral  SpO2:   98% 99%  Weight: (!) 141.4 kg (!) 141.4 kg    Height:        Intake/Output Summary (Last 24 hours) at 09/25/2020 1153 Last data filed at 09/25/2020 0734 Gross per 24 hour  Intake 2543.48 ml  Output 100 ml  Net 2443.48 ml    Filed Weights   09/23/20 0052 09/24/20 2026 09/24/20 2041  Weight: (!) 144.2 kg (!) 141.4 kg (!) 141.4 kg    Examination: General exam: : NAD Respiratory system: CTA B.  No wheezes, no rhonchi.  Speaking in full sentences.  Normal respiratory effort. Cardiovascular system: Regular rate and rhythm no murmurs rubs or gallops.  No JVD.  No lower extremity edema.  Gastrointestinal system: Abdomen soft, distended, diffusely tender to palpation and also in the epigastric region, hypoactive bowel sounds.  No rebound.  No guarding. Central nervous system: Alert and oriented. No focal neurological deficits. Extremities: Symmetric 5 x 5 power. Skin: No rashes, lesions or ulcers Psychiatry: Judgement and insight appear normal. Mood & affect appropriate.  Data Reviewed: I have personally reviewed following labs and imaging studies  CBC: Recent Labs  Lab 09/19/20 0251 09/20/20 0238 09/21/20 0247 09/22/20 0146 09/25/20 0344  WBC 24.8* 21.1* 11.5* 8.0 22.0*  NEUTROABS  --  18.4* 8.4*  --   --   HGB 15.4 14.6 13.0 13.7 12.9*  HCT 42.4 42.7 37.5* 39.5 38.6*  MCV 104.4* 108.9* 107.4* 107.6* 108.1*  PLT 79* 82* 95* 116* 329     Basic Metabolic Panel: Recent Labs  Lab 09/19/20 0813 09/20/20 0238 09/21/20 0247 09/22/20 0146 09/23/20 0323 09/24/20 0311 09/25/20 0344  NA 132*   < > 131* 132* 132* 132* 137  K 3.9   < > 3.1* 3.3* 3.6 3.1* 2.9*  CL 100   < > 101 102 107 108 104  CO2 22   < > 20* 20* 15* 14* 20*  GLUCOSE 169*   < > 203* 170* 198* 167* 108*  BUN 12   < > 20 18 14 14 10   CREATININE 0.75   < > 0.73 0.82 0.79 0.76 0.85  CALCIUM 8.0*   < > 7.3* 7.4* 7.8* 8.0* 8.1*   MG 1.3*   < > 2.2 2.1 2.3 2.2 1.9  PHOS 2.1*  --  2.3* 2.7 3.5  --  3.2   < > = values in this interval not displayed.     GFR: Estimated Creatinine Clearance: 140 mL/min (by C-G formula based on SCr of 0.85 mg/dL).  Liver Function Tests: Recent Labs  Lab 09/19/20 0813 09/21/20 0247 09/22/20 0146 09/23/20 0323 09/25/20 0344  AST 40  --   --   --   --   ALT 26  --   --   --   --   ALKPHOS 54  --   --   --   --   BILITOT 2.4*  --   --   --   --   PROT 5.4*  --   --   --   --   ALBUMIN 2.6* 2.1* 2.3* 2.4*  2.4*     CBG: Recent Labs  Lab 09/24/20 1158 09/24/20 1634 09/24/20 2131 09/25/20 0722 09/25/20 1122  GLUCAP 164* 99 125* 98 93      Recent Results (from the past 240 hour(s))  Resp Panel by RT-PCR (Flu A&B, Covid) Nasopharyngeal Swab     Status: None   Collection Time: 09/17/20  9:50 PM   Specimen: Nasopharyngeal Swab; Nasopharyngeal(NP) swabs in vial transport medium  Result Value Ref Range Status   SARS Coronavirus 2 by RT PCR NEGATIVE NEGATIVE Final    Comment: (NOTE) SARS-CoV-2 target nucleic acids are NOT DETECTED.  The SARS-CoV-2 RNA is generally detectable in upper respiratory specimens during the acute phase of infection. The lowest concentration of SARS-CoV-2 viral copies this assay can detect is 138 copies/mL. A negative result does not preclude SARS-Cov-2 infection and should not be used as the sole basis for treatment or other patient management decisions. A negative result may occur with  improper specimen collection/handling, submission of specimen other than nasopharyngeal swab, presence of viral mutation(s) within the areas targeted by this assay, and inadequate number of viral copies(<138 copies/mL). A negative result must be combined with clinical observations, patient history, and epidemiological information. The expected result is Negative.  Fact Sheet for Patients:  BloggerCourse.comhttps://www.fda.gov/media/152166/download  Fact Sheet for  Healthcare Providers:  SeriousBroker.ithttps://www.fda.gov/media/152162/download  This test is no t yet approved or cleared by the [place] FDA and  has been authorized for detection and/or diagnosis of SARS-CoV-2 by FDA under an Emergency Use Authorization (EUA). This EUA will remain  in effect (meaning this test can be used) for the duration of the COVID-19 declaration under Section 564(b)(1) of the Act, 21 U.S.C.section 360bbb-3(b)(1), unless the authorization is terminated  or revoked sooner.       Influenza A by PCR NEGATIVE NEGATIVE Final   Influenza B by PCR NEGATIVE NEGATIVE Final    Comment: (NOTE) The Xpert Xpress SARS-CoV-2/FLU/RSV plus assay is intended as an aid in the diagnosis of influenza from Nasopharyngeal swab specimens and should not be used as a sole basis for treatment. Nasal washings and aspirates are unacceptable for Xpert Xpress SARS-CoV-2/FLU/RSV testing.  Fact Sheet for Patients: BloggerCourse.comhttps://www.fda.gov/media/152166/download  Fact Sheet for Healthcare Providers: SeriousBroker.ithttps://www.fda.gov/media/152162/download  This test is not yet approved or cleared by the [place] FDA and has been authorized for detection and/or diagnosis of SARS-CoV-2 by FDA under an Emergency Use Authorization (EUA). This EUA will remain in effect (meaning this test can be used) for the duration of the COVID-19 declaration under Section 564(b)(1) of the Act, 21 U.S.C. section 360bbb-3(b)(1), unless the authorization is terminated or revoked.  Performed at [facility], [address], [place], [place] [address]   MRSA Next Gen by PCR, Nasal     Status: Abnormal   Collection Time: 09/18/20  3:33 AM   Specimen: Nasal Mucosa; Nasal Swab  Result Value Ref Range Status   MRSA by PCR Next Gen DETECTED (A) NOT DETECTED Final    Comment: RESULT CALLED TO, READ BACK BY AND VERIFIED WITH: PAULINE, RN @ [phone] ON 09/18/20 C VARNER (NOTE) The GeneXpert MRSA Assay (FDA approved for  NASAL specimens only), is one component of a comprehensive MRSA colonization surveillance program. It is not intended to diagnose MRSA infection nor to guide or monitor treatment for MRSA infections. Test performance is not FDA approved in patients less than 58 years old. Performed at [facility], [address] [place], [place] [address]  Radiology Studies: DG Abd 2 Views  Result Date: 09/25/2020 CLINICAL DATA:  60 year old male with pancreatitis, abdominal pain and distension EXAM: ABDOMEN - 2 VIEW COMPARISON:  CT 09/17/2020, plain film 09/24/2020 FINDINGS: Air-fluid level within the stomach is unchanged. Similar pattern of dilated small bowel loops and colonic loops with multiple air-fluid levels. Relative paucity of rectal gas. No radiopaque foreign body.  No gastric tube identified. No displaced fracture IMPRESSION: Unchanged appearance of the abdomen plain film, with multiple air-fluid levels and gaseous distension of stomach, small bowel and colon compatible with ileus. Electronically Signed   By: Gilmer Mor D.O.   On: 09/25/2020 10:36   DG Abd 2 Views  Result Date: 09/24/2020 CLINICAL DATA:  60 year old with abdominal distension. EXAM: ABDOMEN - 2 VIEW COMPARISON:  CT abdomen and pelvis 09/17/2020 FINDINGS: Patchy densities at the left lung base are suggestive for atelectasis and small amount of pleural fluid. Dilated gas-filled loops of small and large bowel. No evidence for free air. IMPRESSION: 1. Gas-filled loops of small and large bowel. Findings are suggestive for an adynamic ileus. 2. Left basilar densities are suggestive for small pleural effusion and atelectasis. Electronically Signed   By: Richarda Overlie M.D.   On: 09/24/2020 13:17        Scheduled Meds:  bisacodyl  10 mg Rectal Once   Chlorhexidine Gluconate Cloth  6 each Topical Daily   diphenhydrAMINE  25 mg Oral Daily   enoxaparin (LOVENOX) injection  140 mg Subcutaneous Q12H    insulin aspart  0-20 Units Subcutaneous TID WC   insulin aspart  0-5 Units Subcutaneous QHS   insulin glargine  12 Units Subcutaneous Q24H   iohexol       multivitamin with minerals  1 tablet Oral Daily   nortriptyline  10 mg Oral Daily   nortriptyline  20 mg Oral QHS   pantoprazole (PROTONIX) IV  40 mg Intravenous Q24H   thiamine  100 mg Oral Daily   Or   thiamine  100 mg Intravenous Daily   Continuous Infusions:  famotidine (PEPCID) IV 20 mg (09/24/20 2237)   folic acid (FOLVITE) IVPB 1 mg (09/25/20 1009)   potassium chloride 10 mEq (09/25/20 1111)    sodium bicarbonate (isotonic) infusion in sterile water 100 mL/hr at 09/25/20 0941      LOS: 7 days    Time spent: 40 minutes    Ramiro Harvest, MD [facility]   To contact the attending provider between 7A-7P or the covering provider during after hours 7P-7A, please log into the web site [email or web address] and access using universal [place] password for that web site. If you do not have the password, please call the hospital operator.  09/25/2020, 11:53 AM

## 2020-09-25 NOTE — TOC Progression Note (Signed)
Transition of Care (TOC) - Progression Note   Patient Details  Name: Bryce Mendoza MRN: [id number] Date of Birth: 03/16/1960  Transition of Care (TOC) CM/SW Contact  Megan S Glenn, LCSW Phone Number: 09/25/2020, 1:23 PM  Clinical Narrative: TOC consulted for SA resources. CSW met with patient and confirmed advance care directive paperwork was completed with the chaplain. CSW asked about SA resources. Patient declined resources at this time as he is not interested. TOC signing off, but can be consulted again if needed.  Expected Discharge Plan: Home/Self Care Barriers to Discharge: Continued Medical Work up  Expected Discharge Plan and Services Expected Discharge Plan: Home/Self Care Living arrangements for the past 2 months: Single Family Home  Readmission Risk Interventions No flowsheet data found.  
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
## 2020-09-25 NOTE — Progress Notes (Signed)
Hypoglycemic Event  CBG: 60  Treatment: D50 25 mL (12.5 gm)  Symptoms: None  Follow-up CBG: Time:16:59 CBG Result:104  Possible Reasons for Event: Inadequate meal intake and Medication regimen:    Comments/MD notified:Dr. Ashok Norris M Ayush Boulet

## 2020-09-26 DIAGNOSIS — E0810 Diabetes mellitus due to underlying condition with ketoacidosis without coma: Secondary | ICD-10-CM | POA: Diagnosis not present

## 2020-09-26 DIAGNOSIS — F1029 Alcohol dependence with unspecified alcohol-induced disorder: Secondary | ICD-10-CM | POA: Diagnosis not present

## 2020-09-26 DIAGNOSIS — E872 Acidosis: Secondary | ICD-10-CM | POA: Diagnosis not present

## 2020-09-26 DIAGNOSIS — K8500 Idiopathic acute pancreatitis without necrosis or infection: Secondary | ICD-10-CM | POA: Diagnosis not present

## 2020-09-26 LAB — CBC WITH DIFFERENTIAL/PLATELET
Abs Immature Granulocytes: 0.47 10*3/uL — ABNORMAL HIGH (ref 0.00–0.07)
Basophils Absolute: 0.1 10*3/uL (ref 0.0–0.1)
Basophils Relative: 1 %
Eosinophils Absolute: 0.1 10*3/uL (ref 0.0–0.5)
Eosinophils Relative: 0 %
HCT: 35.9 % — ABNORMAL LOW (ref 39.0–52.0)
Hemoglobin: 12.3 g/dL — ABNORMAL LOW (ref 13.0–17.0)
Immature Granulocytes: 3 %
Lymphocytes Relative: 21 %
Lymphs Abs: 3.6 10*3/uL (ref 0.7–4.0)
MCH: 36.7 pg — ABNORMAL HIGH (ref 26.0–34.0)
MCHC: 34.3 g/dL (ref 30.0–36.0)
MCV: 107.2 fL — ABNORMAL HIGH (ref 80.0–100.0)
Monocytes Absolute: 0.9 10*3/uL (ref 0.1–1.0)
Monocytes Relative: 5 %
Neutro Abs: 12.3 10*3/uL — ABNORMAL HIGH (ref 1.7–7.7)
Neutrophils Relative %: 70 %
Platelets: 296 10*3/uL (ref 150–400)
RBC: 3.35 MIL/uL — ABNORMAL LOW (ref 4.22–5.81)
RDW: 13.3 % (ref 11.5–15.5)
WBC: 17.4 10*3/uL — ABNORMAL HIGH (ref 4.0–10.5)
nRBC: 0 % (ref 0.0–0.2)

## 2020-09-26 LAB — COMPREHENSIVE METABOLIC PANEL
ALT: 25 U/L (ref 0–44)
AST: 29 U/L (ref 15–41)
Albumin: 2.3 g/dL — ABNORMAL LOW (ref 3.5–5.0)
Alkaline Phosphatase: 100 U/L (ref 38–126)
Anion gap: 12 (ref 5–15)
BUN: 7 mg/dL (ref 6–20)
CO2: 24 mmol/L (ref 22–32)
Calcium: 7.3 mg/dL — ABNORMAL LOW (ref 8.9–10.3)
Chloride: 102 mmol/L (ref 98–111)
Creatinine, Ser: 0.67 mg/dL (ref 0.61–1.24)
GFR, Estimated: >60 mL/min (ref >60)
Glucose, Bld: 86 mg/dL (ref 70–99)
Potassium: 2.6 mmol/L — CL (ref 3.5–5.1)
Sodium: 138 mmol/L (ref 135–145)
Total Bilirubin: 1.2 mg/dL (ref 0.3–1.2)
Total Protein: 5.3 g/dL — ABNORMAL LOW (ref 6.5–8.1)

## 2020-09-26 LAB — PHOSPHORUS: Phosphorus: 4.1 mg/dL (ref 2.5–4.6)

## 2020-09-26 LAB — GLUCOSE, CAPILLARY
Glucose-Capillary: 103 mg/dL — ABNORMAL HIGH (ref 70–99)
Glucose-Capillary: 104 mg/dL — ABNORMAL HIGH (ref 70–99)
Glucose-Capillary: 110 mg/dL — ABNORMAL HIGH (ref 70–99)
Glucose-Capillary: 124 mg/dL — ABNORMAL HIGH (ref 70–99)
Glucose-Capillary: 69 mg/dL — ABNORMAL LOW (ref 70–99)
Glucose-Capillary: 88 mg/dL (ref 70–99)
Glucose-Capillary: 96 mg/dL (ref 70–99)

## 2020-09-26 LAB — MAGNESIUM: Magnesium: 1.7 mg/dL (ref 1.7–2.4)

## 2020-09-26 LAB — LIPASE, BLOOD: Lipase: 23 U/L (ref 11–51)

## 2020-09-26 MED ORDER — POTASSIUM CHLORIDE 10 MEQ/100ML IV SOLN
10.0000 meq | INTRAVENOUS | Status: AC
  Administered 2020-09-26 (×6): 10 meq via INTRAVENOUS
  Filled 2020-09-26 (×5): qty 100

## 2020-09-26 MED ORDER — POLYETHYLENE GLYCOL 3350 17 G PO PACK: 17.0000 g | PACK | Freq: Two times a day (BID) | ORAL | Status: DC

## 2020-09-26 MED ORDER — SODIUM CHLORIDE 0.9 % IV SOLN
1.0000 g | Freq: Three times a day (TID) | INTRAVENOUS | Status: DC
  Administered 2020-09-26 – 2020-10-02 (×18): 1 g via INTRAVENOUS
  Filled 2020-09-26 (×18): qty 1

## 2020-09-26 MED ORDER — DEXTROSE 50 % IV SOLN
12.5000 g | INTRAVENOUS | Status: AC
  Administered 2020-09-26: 12.5 g via INTRAVENOUS

## 2020-09-26 MED ORDER — MUPIROCIN 2 % EX OINT
1.0000 "application " | TOPICAL_OINTMENT | Freq: Two times a day (BID) | CUTANEOUS | Status: AC
  Administered 2020-09-26 – 2020-09-30 (×10): 1 via NASAL
  Filled 2020-09-26 (×4): qty 22

## 2020-09-26 MED ORDER — PANTOPRAZOLE SODIUM 40 MG PO TBEC
40.0000 mg | DELAYED_RELEASE_TABLET | Freq: Every day | ORAL | Status: DC
  Administered 2020-09-26 – 2020-10-09 (×14): 40 mg via ORAL
  Filled 2020-09-26 (×14): qty 1

## 2020-09-26 MED ORDER — POTASSIUM CHLORIDE 10 MEQ/100ML IV SOLN
10.0000 meq | INTRAVENOUS | Status: DC
Start: 2020-09-26 — End: 2020-09-26

## 2020-09-26 MED ORDER — MAGNESIUM SULFATE 2 GM/50ML IV SOLN
2.0000 g | Freq: Once | INTRAVENOUS | Status: AC
  Administered 2020-09-26: 2 g via INTRAVENOUS
  Filled 2020-09-26: qty 50

## 2020-09-26 MED ORDER — POTASSIUM CHLORIDE 10 MEQ/100ML IV SOLN
10.0000 meq | INTRAVENOUS | Status: AC
  Administered 2020-09-26 – 2020-09-27 (×6): 10 meq via INTRAVENOUS
  Filled 2020-09-26 (×6): qty 100

## 2020-09-26 MED ORDER — LACTATED RINGERS IV SOLN: INTRAVENOUS | Status: DC

## 2020-09-26 NOTE — Progress Notes (Signed)
Pharmacy Antibiotic Note  Bryce Mendoza is a 60 y.o. male admitted on 09/17/2020 with necrotizing pancreatitis.  Pharmacy has been consulted for meropenem dosing (previously on cefepime).  Plan: Meropenem 1g IV q8 hr Consider using Cefepime + Flagyl in place of meropenem; both IDSA and ESCMID consider advanced-generation cephalosporins (in combination with metronidazole) acceptable treatment for infected necrotizing pancreatitis.    Height: 6\' 1"  (185.4 cm) Weight: (!) 141.4 kg (311 lb 12.8 oz) (rechecked for pharmacy, standing scale) IBW/kg (Calculated) : 79.9  Temp (24hrs), Avg:98.2 F (36.8 C), Min:97.6 F (36.4 C), Max:98.7 F (37.1 C)  Recent Labs  Lab 09/20/20 0238 09/21/20 0247 09/22/20 0146 09/23/20 0323 09/24/20 0311 09/25/20 0344 09/26/20 0250  WBC 21.1* 11.5* 8.0  --   --  22.0* 17.4*  CREATININE 0.92 0.73 0.82 0.79 0.76 0.85 0.67    Estimated Creatinine Clearance: 148.8 mL/min (by C-G formula based on SCr of 0.67 mg/dL).    No Known Allergies  Antimicrobials this admission: 7/26 cefepime >> 7/27 meropenem >>   Dose adjustments this admission: n/a  Microbiology results: 7/26 MRSA PCR: positive 7/19 MRSA PCR: positive  Thank you for allowing pharmacy to be a part of this patient's care.  Nolene Rocks A 09/26/2020 3:59 PM

## 2020-09-26 NOTE — Progress Notes (Signed)
PROGRESS NOTE    Bryce Mendoza  ZOX:[id number] DOB: 11/06/1960 DOA: 09/17/2020 PCP: Claiborne Rigg, NP    Brief Narrative:  Bryce Mendoza was admitted to the hospital with a working diagnosis of acute severe pancreatitis, complicated with diabetes ketoacidosis.  60 year old male past medical history for DVT/PE, dyslipidemia, severe polyneuropathy who presented with epigastric abdominal pain.  Reported 2 days of abdominal pain, epigastric in location, radiated to his back.  Worse with movement or ambulation.  Because of persistent symptoms he presented to [place] ED then transferred to [facility].  On his initial physical examination blood pressure 172/96, heart rate 103, respirate 23, temperature 97.5, oxygen saturation 96%, he had dry mucous membranes, his lungs were clear to auscultation bilaterally, heart S1-S2, present rhythmic, abdomen was soft and nontender, no lower extremity edema.  Sodium 129, potassium 4.0, chloride 89, bicarb 15, glucose 422, BUN 14, creatinine 0.86, lipase 2959, white count 22.4, hemoglobin 14.9, hematocrit 41.4, platelets 171.  CT of the abdomen show acute pancreatitis, no organized fluid collections.  Small wall thickening of duodenum.  Hepatic steatosis.            Patient was placed on intravenous fluids and IV insulin for diabetes ketoacidosis.  Follow-up CT showed severe acute interstitial edematous pancreatitis likely transitioning into necrotic pancreatitis.  Nearly glandular and peripancreatic necrosis. Patient was placed on cefepime for antibiotic therapy.  Assessment & Plan:   Principal Problem:   Acute pancreatitis Active Problems:   Idiopathic polyneuropathy   DKA (diabetic ketoacidosis) (HCC)   Mixed hyperlipidemia   Lactic acidosis   Elevated BP without diagnosis of hypertension   Alcohol dependence with unspecified alcohol-induced disorder (HCC)   Hypomagnesemia   Hypophosphatemia   Constipation   Abdominal distension   Acidosis,  metabolic   Acute necrotizing pancreatitis/complicated with ileus Patient with persistent abdominal pain, controlled with analgesics.  Has bee NPO with no nausea or vomiting.  Wbc is 17 and Plt 296   Plan to continue supportive medical therapy with meropenem, IV fluids and IV analgesics.  Antiacid therapy and as needed antiemetics.  Ok to transfer to telemetry ward.  Will try to advance diet to clears today.   2. New onset  adult onset T1DM with DKA,. Uncontrolled T2dM A1c 9,4. Continue glucose control with insulin sliding scale and basal insulin 12 units daily.   3. Dyslipidemia and obesity class 3. Continue holding statin for now. BMI is 41,4.   4. Alcohol abuse. No sings of acute withdrawal, continue neuro checks per unit protocol.   5. Hx of DVT and PE. Continue anticoagulation with enoxaparin, while critically ill. At home on apixaban.   6. Hypokalemia and hypomagnesemia/ metabolic acidosis Renal function with serum cr at 0,67 with K at 2,6 and Mg at 1,7. Serum bicarbonate at 24.    Continue aggressive K correction with KCL IV 60 meq today and will add 2 g Mag sulfate. Follow up renal function and electrolytes in am.   7. Depression. Continue with nortriptyline.   Patient continue to be at high risk for worsening pancreatitis,   Status is: Inpatient  Remains inpatient appropriate because:Inpatient level of care appropriate due to severity of illness  Dispo: The patient is from: Home              Anticipated d/c is to: Home              Patient currently is not medically stable to d/c.   Difficult to place patient No  DVT prophylaxis: Enoxaparin   Code Status:   full  Family Communication:  No family at the bedside     Consultants:  GI    Antimicrobials:  Meropenem    Subjective: Patient continue to be very weak and deconditioned, no nausea or vomiting, continue to have abdominal pain, improved with analgesics   Objective: Vitals:   09/26/20 0545  09/26/20 0600 09/26/20 0754 09/26/20 0800  BP:  132/70  118/80  Pulse:  98  90  Resp:  (!) 21  (!) 21  Temp: 98.7 F (37.1 C)  98.6 F (37 C) 98.6 F (37 C)  TempSrc: Oral  Oral Oral  SpO2:  97%  98%  Weight:      Height:        Intake/Output Summary (Last 24 hours) at 09/26/2020 0936 Last data filed at 09/26/2020 0900 Gross per 24 hour  Intake 5683.5 ml  Output 420 ml  Net 5263.5 ml   Filed Weights   09/23/20 0052 09/24/20 2026 09/24/20 2041  Weight: (!) 144.2 kg (!) 141.4 kg (!) 141.4 kg    Examination:   General: Not in pain or dyspnea, deconditioned  Neurology: Awake and alert, non focal  E ENT: no pallor, no icterus, oral mucosa moist Cardiovascular: No JVD. S1-S2 present, rhythmic, no gallops, rubs, or murmurs. No lower extremity edema. Pulmonary: positive breath sounds bilaterally, adequate air movement, no wheezing, rhonchi or rales. Gastrointestinal. Abdomen distended but not tender Skin. No rashes Musculoskeletal: no joint deformities     Data Reviewed: I have personally reviewed following labs and imaging studies  CBC: Recent Labs  Lab 09/20/20 0238 09/21/20 0247 09/22/20 0146 09/25/20 0344 09/26/20 0250  WBC 21.1* 11.5* 8.0 22.0* 17.4*  NEUTROABS 18.4* 8.4*  --   --  12.3*  HGB 14.6 13.0 13.7 12.9* 12.3*  HCT 42.7 37.5* 39.5 38.6* 35.9*  MCV 108.9* 107.4* 107.6* 108.1* 107.2*  PLT 82* 95* 116* 329 296   Basic Metabolic Panel: Recent Labs  Lab 09/21/20 0247 09/22/20 0146 09/23/20 0323 09/24/20 0311 09/25/20 0344 09/26/20 0250  NA 131* 132* 132* 132* 137 138  K 3.1* 3.3* 3.6 3.1* 2.9* 2.6*  CL 101 102 107 108 104 102  CO2 20* 20* 15* 14* 20* 24  GLUCOSE 203* 170* 198* 167* 108* 86  BUN 20 18 14 14 10 7   CREATININE 0.73 0.82 0.79 0.76 0.85 0.67  CALCIUM 7.3* 7.4* 7.8* 8.0* 8.1* 7.3*  MG 2.2 2.1 2.3 2.2 1.9 1.7  PHOS 2.3* 2.7 3.5  --  3.2 4.1   GFR: Estimated Creatinine Clearance: 148.8 mL/min (by C-G formula based on SCr of 0.67  mg/dL). Liver Function Tests: Recent Labs  Lab 09/21/20 0247 09/22/20 0146 09/23/20 0323 09/25/20 0344 09/26/20 0250  AST  --   --   --   --  29  ALT  --   --   --   --  25  ALKPHOS  --   --   --   --  100  BILITOT  --   --   --   --  1.2  PROT  --   --   --   --  5.3*  ALBUMIN 2.1* 2.3* 2.4* 2.4* 2.3*   Recent Labs  Lab 09/21/20 0247 09/26/20 0250  LIPASE 41 23   No results for input(s): AMMONIA in the last 168 hours. Coagulation Profile: No results for input(s): INR, PROTIME in the last 168 hours. Cardiac Enzymes: No results for input(s): CKTOTAL,  CKMB, CKMBINDEX, TROPONINI in the last 168 hours. BNP (last 3 results) No results for input(s): PROBNP in the last 8760 hours. HbA1C: No results for input(s): HGBA1C in the last 72 hours. CBG: Recent Labs  Lab 09/25/20 2332 09/26/20 0015 09/26/20 0507 09/26/20 0542 09/26/20 0920  GLUCAP 62* 88 69* 110* 96   Lipid Profile: No results for input(s): CHOL, HDL, LDLCALC, TRIG, CHOLHDL, LDLDIRECT in the last 72 hours. Thyroid Function Tests: No results for input(s): TSH, T4TOTAL, FREET4, T3FREE, THYROIDAB in the last 72 hours. Anemia Panel: No results for input(s): VITAMINB12, FOLATE, FERRITIN, TIBC, IRON, RETICCTPCT in the last 72 hours.    Radiology Studies: I have reviewed all of the imaging during this hospital visit personally     Scheduled Meds:  bisacodyl  10 mg Rectal Once   chlorhexidine  15 mL Mouth Rinse BID   Chlorhexidine Gluconate Cloth  6 each Topical Daily   enoxaparin (LOVENOX) injection  140 mg Subcutaneous Q12H   insulin aspart  0-20 Units Subcutaneous Q6H   insulin glargine  12 Units Subcutaneous Q24H   mouth rinse  15 mL Mouth Rinse q12n4p   mupirocin ointment  1 application Nasal BID   nortriptyline  10 mg Oral Daily   nortriptyline  20 mg Oral QHS   pantoprazole (PROTONIX) IV  40 mg Intravenous Q24H   thiamine  100 mg Oral Daily   Or   thiamine  100 mg Intravenous Daily   Continuous  Infusions:  ceFEPime (MAXIPIME) IV Stopped (09/26/20 3729)   folic acid (FOLVITE) IVPB Stopped (09/25/20 1040)   potassium chloride 80 mL/hr at 09/26/20 0900    sodium bicarbonate (isotonic) infusion in sterile water 150 mL/hr at 09/26/20 0900     LOS: 8 days        Gilford Lardizabal Annett Gula, MD
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
## 2020-09-27 DIAGNOSIS — F1029 Alcohol dependence with unspecified alcohol-induced disorder: Secondary | ICD-10-CM | POA: Diagnosis not present

## 2020-09-27 DIAGNOSIS — R030 Elevated blood-pressure reading, without diagnosis of hypertension: Secondary | ICD-10-CM | POA: Diagnosis not present

## 2020-09-27 DIAGNOSIS — K8500 Idiopathic acute pancreatitis without necrosis or infection: Secondary | ICD-10-CM | POA: Diagnosis not present

## 2020-09-27 DIAGNOSIS — E0810 Diabetes mellitus due to underlying condition with ketoacidosis without coma: Secondary | ICD-10-CM | POA: Diagnosis not present

## 2020-09-27 LAB — CBC WITH DIFFERENTIAL/PLATELET
Abs Immature Granulocytes: 0.29 10*3/uL — ABNORMAL HIGH (ref 0.00–0.07)
Basophils Absolute: 0.1 10*3/uL (ref 0.0–0.1)
Basophils Relative: 0 %
Eosinophils Absolute: 0.1 10*3/uL (ref 0.0–0.5)
Eosinophils Relative: 1 %
HCT: 36.9 % — ABNORMAL LOW (ref 39.0–52.0)
Hemoglobin: 12.5 g/dL — ABNORMAL LOW (ref 13.0–17.0)
Immature Granulocytes: 2 %
Lymphocytes Relative: 26 %
Lymphs Abs: 3.5 10*3/uL (ref 0.7–4.0)
MCH: 36 pg — ABNORMAL HIGH (ref 26.0–34.0)
MCHC: 33.9 g/dL (ref 30.0–36.0)
MCV: 106.3 fL — ABNORMAL HIGH (ref 80.0–100.0)
Monocytes Absolute: 0.9 10*3/uL (ref 0.1–1.0)
Monocytes Relative: 6 %
Neutro Abs: 8.7 10*3/uL — ABNORMAL HIGH (ref 1.7–7.7)
Neutrophils Relative %: 65 %
Platelets: 333 10*3/uL (ref 150–400)
RBC: 3.47 MIL/uL — ABNORMAL LOW (ref 4.22–5.81)
RDW: 13.3 % (ref 11.5–15.5)
WBC: 13.4 10*3/uL — ABNORMAL HIGH (ref 4.0–10.5)
nRBC: 0 % (ref 0.0–0.2)

## 2020-09-27 LAB — BASIC METABOLIC PANEL
Anion gap: 9 (ref 5–15)
BUN: <5 mg/dL — ABNORMAL LOW (ref 6–20)
CO2: 23 mmol/L (ref 22–32)
Calcium: 7.1 mg/dL — ABNORMAL LOW (ref 8.9–10.3)
Chloride: 104 mmol/L (ref 98–111)
Creatinine, Ser: 0.63 mg/dL (ref 0.61–1.24)
GFR, Estimated: >60 mL/min (ref >60)
Glucose, Bld: 119 mg/dL — ABNORMAL HIGH (ref 70–99)
Potassium: 3.2 mmol/L — ABNORMAL LOW (ref 3.5–5.1)
Sodium: 136 mmol/L (ref 135–145)

## 2020-09-27 LAB — MAGNESIUM: Magnesium: 1.9 mg/dL (ref 1.7–2.4)

## 2020-09-27 LAB — GLUCOSE, CAPILLARY
Glucose-Capillary: 108 mg/dL — ABNORMAL HIGH (ref 70–99)
Glucose-Capillary: 118 mg/dL — ABNORMAL HIGH (ref 70–99)
Glucose-Capillary: 124 mg/dL — ABNORMAL HIGH (ref 70–99)
Glucose-Capillary: 147 mg/dL — ABNORMAL HIGH (ref 70–99)

## 2020-09-27 MED ORDER — MAGNESIUM SULFATE 2 GM/50ML IV SOLN
2.0000 g | Freq: Once | INTRAVENOUS | Status: AC
  Administered 2020-09-27: 2 g via INTRAVENOUS
  Filled 2020-09-27: qty 50

## 2020-09-27 MED ORDER — POTASSIUM CHLORIDE CRYS ER 10 MEQ PO TBCR
40.0000 meq | EXTENDED_RELEASE_TABLET | Freq: Once | ORAL | Status: AC
  Administered 2020-09-27: 40 meq via ORAL
  Filled 2020-09-27: qty 4

## 2020-09-27 NOTE — Progress Notes (Signed)
ANTICOAGULATION CONSULT NOTE   Pharmacy Consult for Lovenox Indication: hx pulmonary embolus and DVT  No Known Allergies  Filed Weights   09/24/20 2026 09/24/20 2041 09/26/20 1805  Weight: (!) 141.4 kg (311 lb 12.8 oz) (!) 141.4 kg (311 lb 12.8 oz) (!) 145.5 kg (320 lb 12.3 oz)    Vital Signs: Temp: 98 F (36.7 C) (07/28 0405) Temp Source: Oral (07/28 0405) BP: 126/85 (07/28 0405) Pulse Rate: 85 (07/28 0405)  Labs: Recent Labs    09/25/20 0344 09/26/20 0250 09/27/20 0544  HGB 12.9* 12.3* 12.5*  HCT 38.6* 35.9* 36.9*  PLT 329 296 333  CREATININE 0.85 0.67 0.63     Estimated Creatinine Clearance: 151 mL/min (by C-G formula based on SCr of 0.63 mg/dL).   Assessment: Patient is a 60 y.o M with hx DVT/PE on Eliquis 5mg  BID PTA, presented to the ED on 7/18 with c/o abdominal pain. Abdominal CT on 7/18 showed findings consistent with acute pancreatitis.  He was transitioned to lovenox on admission and changed back to Eliquis on 7/22.  Pharmacy has been consulted on 7/25 to transition patient back to lovenox as the patient was made NPO for suspected ileus. - Last dose of Eliquis taken on 7/25 at 0900  Today, 09/27/2020: SCr 0.6, CrCl > 30 ml/min CBC:  Hbg 12.5, Plt WNL No s/s bleeding or complications reported  Goal of Therapy:  Anti-Xa level 0.6-1 units/ml 4hrs after LMWH dose given Monitor platelets by anticoagulation protocol: Yes   Plan:  - Continue Lovenox 1 mg/kg (140 mg) SQ q12h - CBC q72h - Monitor for s/sx bleeding   09/29/2020 PharmD, BCPS Clinical Pharmacist [facility] main pharmacy [phone] 09/27/2020 7:30 AM

## 2020-09-27 NOTE — Progress Notes (Signed)
[facility] Gastroenterology Progress Note  Bryce Mendoza 60 y.o. Mar 10, 1960  CC:  Severe acute pancreatitis  Subjective: Patient reports continued abdominal pain, though slightly improved.  He is tolerating clears without nausea/vomiting.  Continues to have loose BMs, last one ~9PM last night.  ROS : Review of Systems  Cardiovascular:  Negative for chest pain and palpitations.  Gastrointestinal:  Positive for abdominal pain and diarrhea. Negative for blood in stool, constipation, heartburn, melena, nausea and vomiting.     Objective: Vital signs in last 24 hours: Vitals:   09/26/20 2152 09/27/20 0405  BP: 128/86 126/85  Pulse: 86 85  Resp: 15 18  Temp: 98.7 F (37.1 C) 98 F (36.7 C)  SpO2: 99% 98%    Physical Exam:  General:  Alert, cooperative, no distress  Head:  Normocephalic, without obvious abnormality, atraumatic  Eyes:  Anicteric sclera, EOMs intact  Lungs:   Clear to auscultation bilaterally, respirations unlabored  Heart:  Regular rate and rhythm, S1, S2 normal  Abdomen:   Upper abdomen is distended and tense.  Diffuse, mild abdominal tenderness to palpation.  Hypoactive bowel sounds.  Pulses: 2+ and symmetric    Lab Results: Recent Labs    09/25/20 0344 09/26/20 0250 09/27/20 0544  NA 137 138 136  K 2.9* 2.6* 3.2*  CL 104 102 104  CO2 20* 24 23  GLUCOSE 108* 86 119*  BUN 10 7 <5*  CREATININE 0.85 0.67 0.63  CALCIUM 8.1* 7.3* 7.1*  MG 1.9 1.7 1.9  PHOS 3.2 4.1  --    Recent Labs    09/25/20 0344 09/26/20 0250  AST  --  29  ALT  --  25  ALKPHOS  --  100  BILITOT  --  1.2  PROT  --  5.3*  ALBUMIN 2.4* 2.3*   Recent Labs    09/26/20 0250 09/27/20 0544  WBC 17.4* 13.4*  NEUTROABS 12.3* 8.7*  HGB 12.3* 12.5*  HCT 35.9* 36.9*  MCV 107.2* 106.3*  PLT 296 333   No results for input(s): LABPROT, INR in the last 72 hours.    Assessment: Severe acute pancreatitis, idiopathic.  No increased ETOH use, though possible alcohol could play a role.  Mildly elevated triglycerides (165) as of 7/18. No gallstones or biliary dilation on CT.  No new medications or medications known to cause pancreatitis prior to admission. -CT 09/25/20:Severe acute interstitial edematous pancreatitis likely transitioning into necrotic pancreatitis with early glandular and peripancreatic necrosis suspected.  -Normal renal function: BUN <5/ Cr 0.63 -Leukocytosis (WBCs 13.4), improving, likely inflammatory -Hypoalbuminemia    Ileus, secondary to pancreatitis -Hypokalemia (3.2) -Magnesium 1.9   Plan: Continue meropenem.     Continue IVF at 150 cc/h over the next 24.  Continue supportive care.   Continue clear liquids.   For ileus, replete potassium with goal of 4-4.5.  Replete magnesium to maintain >2.  Consider Miralax if patient's stool frequency decreases.   [facility] will follow.   Edrick Kins PA-C 09/27/2020, 11:21 AM  Contact #  [phone]

## 2020-09-27 NOTE — Progress Notes (Addendum)
PROGRESS NOTE    Bryce Mendoza  URK:[id number] DOB: Dec 12, 1960 DOA: 09/17/2020 PCP: Claiborne Rigg, NP    Brief Narrative:  Mr. Edler was admitted to the hospital with a working diagnosis of acute severe pancreatitis, complicated with diabetes ketoacidosis and ileus.   60 year old male past medical history for DVT/PE, dyslipidemia, severe polyneuropathy who presented with epigastric abdominal pain.  Reported 2 days of abdominal pain, epigastric in location, radiated to his back.  Worse with movement or ambulation.  Because of persistent symptoms he presented to [place] ED then transferred to [facility].  On his initial physical examination blood pressure 172/96, heart rate 103, respirate 23, temperature 97.5, oxygen saturation 96%, he had dry mucous membranes, his lungs were clear to auscultation bilaterally, heart S1-S2, present rhythmic, abdomen was soft and nontender, no lower extremity edema.   Sodium 129, potassium 4.0, chloride 89, bicarb 15, glucose 422, BUN 14, creatinine 0.86, lipase 2959, white count 22.4, hemoglobin 14.9, hematocrit 41.4, platelets 171.   CT of the abdomen show acute pancreatitis, no organized fluid collections.  Small wall thickening of duodenum.  Hepatic steatosis.             Patient was placed on intravenous fluids and IV insulin for diabetes ketoacidosis.   Follow-up CT showed severe acute interstitial edematous pancreatitis likely transitioning into necrotic pancreatitis.  Nearly glandular and peripancreatic necrosis. Patient was placed on meropenem for antibiotic therapy.   Patient has developed an ileus.   Assessment & Plan:   Principal Problem:   Acute pancreatitis Active Problems:   Idiopathic polyneuropathy   DKA (diabetic ketoacidosis) (HCC)   Mixed hyperlipidemia   Lactic acidosis   Elevated BP without diagnosis of hypertension   Alcohol dependence with unspecified alcohol-induced disorder (HCC)   Hypomagnesemia   Hypophosphatemia    Constipation   Abdominal distension   Acidosis, metabolic    Acute necrotizing pancreatitis/complicated with ileus Today patient with diarrhea, no nausea or vomiting.  His is tolerating clears, but continue to have abdominal pain and distention.  Wbc is down to 13.    Antibiotic therapy with meropenem for necrotizing pancreatitis. Plan to continue clears for now, pain control with hydromorphone and as needed antiemetics. Continue antiacid therapy. Follow abdominal films in am, if worsening ileus will need NG tube for decompression.  Keep K at 4 and Mg at 2.  Decrease rate of IV fluids to prevent volume overload.    2. New onset  adult onset T1DM with DKA,. Fasting glucose is 119 this am, plan to continue glucose control with insulin sliding scale and basal insulin 12 units daily.Tolerating clears well.    3. Dyslipidemia and obesity class 3.  BMI is 41,4. Continue to hold statin for now.    4. Alcohol abuse. No withdrawal symptoms.   5. Hx of DVT and PE. On enoxaparin for anticoagulation while critically ill.  Holding on apixaban.   6. Hypokalemia and hypomagnesemia/ metabolic acidosis Today serum cr is 0,63, K is 3,2 and mag at 1,9 Continue K correction with Kcl 60 meq IV, and add 2 g mag sulfate. Follow up on renal function in am.    7. Depression. On nortriptyline.      Patient continue to be at high risk for worsening pancreatitis and ileus.   Status is: Inpatient  Remains inpatient appropriate because:Inpatient level of care appropriate due to severity of illness  Dispo: The patient is from: Home  Anticipated d/c is to: Home              Patient currently is not medically stable to d/c.   Difficult to place patient No   DVT prophylaxis: Enoxaparin   Code Status:   full  Family Communication:  No family at the bedside     Consultants:  GI    Antimicrobials:  Meropenem.     Subjective: Patient continue to have abdominal distention and  pain, no nausea or vomiting, positive diarrhea.   Objective: Vitals:   09/26/20 1805 09/26/20 2152 09/27/20 0405 09/27/20 1348  BP: 133/90 128/86 126/85 117/81  Pulse: 94 86 85 86  Resp: 20 15 18 18   Temp: 98.3 F (36.8 C) 98.7 F (37.1 C) 98 F (36.7 C) 98.6 F (37 C)  TempSrc: Oral Oral Oral Oral  SpO2: 94% 99% 98% 100%  Weight: (!) 145.5 kg     Height: 6\' 1"  (1.854 m)       Intake/Output Summary (Last 24 hours) at 09/27/2020 1439 Last data filed at 09/27/2020 0600 Gross per 24 hour  Intake 2177.07 ml  Output 250 ml  Net 1927.07 ml   Filed Weights   09/24/20 2026 09/24/20 2041 09/26/20 1805  Weight: (!) 141.4 kg (!) 141.4 kg (!) 145.5 kg    Examination:   General: Not in pain or dyspnea, deconditioned  Neurology: Awake and alert, non focal  E ENT: no pallor, no icterus, oral mucosa moist Cardiovascular: No JVD. S1-S2 present, rhythmic, no gallops, rubs, or murmurs. + pitting bilateral lower extremity edema. Pulmonary: positive breath sounds bilaterally, adequate air movement, no wheezing, rhonchi or rales. Gastrointestinal. Abdomen distended and tender to palpation, tympanic to percussion  Skin. No rashes Musculoskeletal: no joint deformities     Data Reviewed: I have personally reviewed following labs and imaging studies  CBC: Recent Labs  Lab 09/21/20 0247 09/22/20 0146 09/25/20 0344 09/26/20 0250 09/27/20 0544  WBC 11.5* 8.0 22.0* 17.4* 13.4*  NEUTROABS 8.4*  --   --  12.3* 8.7*  HGB 13.0 13.7 12.9* 12.3* 12.5*  HCT 37.5* 39.5 38.6* 35.9* 36.9*  MCV 107.4* 107.6* 108.1* 107.2* 106.3*  PLT 95* 116* 329 296 333   Basic Metabolic Panel: Recent Labs  Lab 09/21/20 0247 09/22/20 0146 09/23/20 0323 09/24/20 0311 09/25/20 0344 09/26/20 0250 09/27/20 0544  NA 131* 132* 132* 132* 137 138 136  K 3.1* 3.3* 3.6 3.1* 2.9* 2.6* 3.2*  CL 101 102 107 108 104 102 104  CO2 20* 20* 15* 14* 20* 24 23  GLUCOSE 203* 170* 198* 167* 108* 86 119*  BUN 20 18 14 14  10 7  <5*  CREATININE 0.73 0.82 0.79 0.76 0.85 0.67 0.63  CALCIUM 7.3* 7.4* 7.8* 8.0* 8.1* 7.3* 7.1*  MG 2.2 2.1 2.3 2.2 1.9 1.7 1.9  PHOS 2.3* 2.7 3.5  --  3.2 4.1  --    GFR: Estimated Creatinine Clearance: 151 mL/min (by C-G formula based on SCr of 0.63 mg/dL). Liver Function Tests: Recent Labs  Lab 09/21/20 0247 09/22/20 0146 09/23/20 0323 09/25/20 0344 09/26/20 0250  AST  --   --   --   --  29  ALT  --   --   --   --  25  ALKPHOS  --   --   --   --  100  BILITOT  --   --   --   --  1.2  PROT  --   --   --   --  5.3*  ALBUMIN 2.1* 2.3* 2.4* 2.4* 2.3*   Recent Labs  Lab 09/21/20 0247 09/26/20 0250  LIPASE 41 23   No results for input(s): AMMONIA in the last 168 hours. Coagulation Profile: No results for input(s): INR, PROTIME in the last 168 hours. Cardiac Enzymes: No results for input(s): CKTOTAL, CKMB, CKMBINDEX, TROPONINI in the last 168 hours. BNP (last 3 results) No results for input(s): PROBNP in the last 8760 hours. HbA1C: No results for input(s): HGBA1C in the last 72 hours. CBG: Recent Labs  Lab 09/26/20 1205 09/26/20 1613 09/26/20 2346 09/27/20 0513 09/27/20 1122  GLUCAP 104* 124* 103* 118* 147*   Lipid Profile: No results for input(s): CHOL, HDL, LDLCALC, TRIG, CHOLHDL, LDLDIRECT in the last 72 hours. Thyroid Function Tests: No results for input(s): TSH, T4TOTAL, FREET4, T3FREE, THYROIDAB in the last 72 hours. Anemia Panel: No results for input(s): VITAMINB12, FOLATE, FERRITIN, TIBC, IRON, RETICCTPCT in the last 72 hours.    Radiology Studies: I have reviewed all of the imaging during this hospital visit personally     Scheduled Meds:  bisacodyl  10 mg Rectal Once   chlorhexidine  15 mL Mouth Rinse BID   Chlorhexidine Gluconate Cloth  6 each Topical Daily   enoxaparin (LOVENOX) injection  140 mg Subcutaneous Q12H   insulin aspart  0-20 Units Subcutaneous Q6H   insulin glargine  12 Units Subcutaneous Q24H   mouth rinse  15 mL Mouth  Rinse q12n4p   mupirocin ointment  1 application Nasal BID   nortriptyline  10 mg Oral Daily   nortriptyline  20 mg Oral QHS   pantoprazole  40 mg Oral Daily   thiamine  100 mg Oral Daily   Continuous Infusions:  lactated ringers 150 mL/hr at 09/27/20 0600   meropenem (MERREM) IV Stopped (09/27/20 0944)     LOS: 9 days        Tamon Parkerson Annett Gula, MD

## 2020-09-28 ENCOUNTER — Inpatient Hospital Stay (HOSPITAL_COMMUNITY): Payer: Medicaid Other

## 2020-09-28 DIAGNOSIS — F1029 Alcohol dependence with unspecified alcohol-induced disorder: Secondary | ICD-10-CM | POA: Diagnosis not present

## 2020-09-28 DIAGNOSIS — K8500 Idiopathic acute pancreatitis without necrosis or infection: Secondary | ICD-10-CM | POA: Diagnosis not present

## 2020-09-28 DIAGNOSIS — E0810 Diabetes mellitus due to underlying condition with ketoacidosis without coma: Secondary | ICD-10-CM | POA: Diagnosis not present

## 2020-09-28 DIAGNOSIS — K567 Ileus, unspecified: Secondary | ICD-10-CM

## 2020-09-28 DIAGNOSIS — E872 Acidosis: Secondary | ICD-10-CM | POA: Diagnosis not present

## 2020-09-28 LAB — BASIC METABOLIC PANEL
Anion gap: 9 (ref 5–15)
BUN: <5 mg/dL — ABNORMAL LOW (ref 6–20)
CO2: 24 mmol/L (ref 22–32)
Calcium: 7.5 mg/dL — ABNORMAL LOW (ref 8.9–10.3)
Chloride: 104 mmol/L (ref 98–111)
Creatinine, Ser: 0.7 mg/dL (ref 0.61–1.24)
GFR, Estimated: >60 mL/min (ref >60)
Glucose, Bld: 92 mg/dL (ref 70–99)
Potassium: 3.1 mmol/L — ABNORMAL LOW (ref 3.5–5.1)
Sodium: 137 mmol/L (ref 135–145)

## 2020-09-28 LAB — ANA W/REFLEX IF POSITIVE: Anti Nuclear Antibody (ANA): NEGATIVE

## 2020-09-28 LAB — GLUCOSE, CAPILLARY
Glucose-Capillary: 107 mg/dL — ABNORMAL HIGH (ref 70–99)
Glucose-Capillary: 111 mg/dL — ABNORMAL HIGH (ref 70–99)
Glucose-Capillary: 113 mg/dL — ABNORMAL HIGH (ref 70–99)
Glucose-Capillary: 125 mg/dL — ABNORMAL HIGH (ref 70–99)
Glucose-Capillary: 91 mg/dL (ref 70–99)

## 2020-09-28 LAB — IGG 4: IgG, Subclass 4: 6 mg/dL (ref 2–96)

## 2020-09-28 MED ORDER — INSULIN GLARGINE 100 UNIT/ML ~~LOC~~ SOLN
6.0000 [IU] | SUBCUTANEOUS | Status: DC
  Administered 2020-09-29 – 2020-10-09 (×11): 6 [IU] via SUBCUTANEOUS
  Filled 2020-09-28 (×11): qty 0.06

## 2020-09-28 MED ORDER — POTASSIUM CHLORIDE CRYS ER 10 MEQ PO TBCR
40.0000 meq | EXTENDED_RELEASE_TABLET | ORAL | Status: AC
Start: 2020-09-28 — End: 2020-09-28
  Administered 2020-09-28 (×2): 40 meq via ORAL
  Filled 2020-09-28 (×2): qty 4

## 2020-09-28 NOTE — Progress Notes (Signed)
PROGRESS NOTE    Bryce Mendoza  SWF:[id number] DOB: April 20, 1960 DOA: 09/17/2020 PCP: Claiborne Rigg, NP    Brief Narrative:  [name] was admitted to the hospital with a working diagnosis of acute severe pancreatitis, complicated with diabetes ketoacidosis and ileus.   60 year old male past medical history for DVT/PE, dyslipidemia, severe polyneuropathy who presented with epigastric abdominal pain.  Reported 2 days of abdominal pain, epigastric in location, radiated to his back.  Worse with movement or ambulation.  Because of persistent symptoms he presented to [place] ED then transferred to [facility].  On his initial physical examination blood pressure 172/96, heart rate 103, respirate 23, temperature 97.5, oxygen saturation 96%, he had dry mucous membranes, his lungs were clear to auscultation bilaterally, heart S1-S2, present rhythmic, abdomen was soft and nontender, no lower extremity edema.   Sodium 129, potassium 4.0, chloride 89, bicarb 15, glucose 422, BUN 14, creatinine 0.86, lipase 2959, white count 22.4, hemoglobin 14.9, hematocrit 41.4, platelets 171.   CT of the abdomen show acute pancreatitis, no organized fluid collections.  Small wall thickening of duodenum.  Hepatic steatosis.             Patient was placed on intravenous fluids and IV insulin for diabetes ketoacidosis.   Follow-up CT showed severe acute interstitial edematous pancreatitis likely transitioning into necrotic pancreatitis.  Nearly glandular and peripancreatic necrosis. Patient was placed on meropenem for antibiotic therapy.   Patient has developed an ileus.    Assessment & Plan:   Principal Problem:   Acute pancreatitis Active Problems:   Idiopathic polyneuropathy   DKA (diabetic ketoacidosis) (HCC)   Mixed hyperlipidemia   Lactic acidosis   Elevated BP without diagnosis of hypertension   Alcohol dependence with unspecified alcohol-induced disorder (HCC)   Hypomagnesemia   Hypophosphatemia    Constipation   Abdominal distension   Acidosis, metabolic     Acute necrotizing pancreatitis/complicated with ileus  Abdominal films with persistent bowel distention, small bowel and colon throughout. Largest loops of transverse colon 10.6 cm in caliber.  He is tolerating clears well with no nausea or vomiting. Abdominal pain as been persistent but controlled with analgesics  Continue with meropenem for necrotizing pancreatitis. K and Mg correction.  Advance diet as tolerated and decrease IV fluids to 50 ml per hr   Encourage to be out of bed to chair tid, ambulate in the hallway, PT and OT.    2. New onset  adult onset T1DM with DKA, hypoglycemia Fasting glucose this am 92, capillary. Will reduce base insulin to 6 units and continue close glucose cover and monitoring with insulin sliding scale.  Tolerating clears.    3. Dyslipidemia and obesity class 3.  BMI is 41,4. Holding on statin therapy.    4. Alcohol abuse. No signs of acute withdrawal symptoms. Discontinue lorazepam and thiamine    5. Hx of DVT and PE. continue with enoxaparin for anticoagulation. At the time of discharge will resume apixaban.    6. Hypokalemia and hypomagnesemia/ metabolic acidosis Continue K correction with Kcl plan for 80 meq in 2 divided doses today. Follow up renal panel in am, avoid hypotension and nephrotoxic medications.  7. Depression. Continue with nortriptyline.    Patient continue to be at high risk for worsening ileus.   Status is: Inpatient  Remains inpatient appropriate because:Inpatient level of care appropriate due to severity of illness  Dispo: The patient is from: Home  Anticipated d/c is to: Home              Patient currently is not medically stable to d/c.   Difficult to place patient No  DVT prophylaxis: Enoxaparin   Code Status:   full  Family Communication:  No family at the bedside      Consultants:  GI    Subjective: Patient with persistent  abdominal distention and pain. Analgesics controlling pain, no nausea or vomiting and tolerating clears.   Objective: Vitals:   09/27/20 0405 09/27/20 1348 09/27/20 2007 09/28/20 0536  BP: 126/85 117/81 138/87 (!) 133/91  Pulse: 85 86 85 83  Resp: 18 18 18 18   Temp: 98 F (36.7 C) 98.6 F (37 C) 98.9 F (37.2 C) 98 F (36.7 C)  TempSrc: Oral Oral    SpO2: 98% 100% 97% 99%  Weight:      Height:        Intake/Output Summary (Last 24 hours) at 09/28/2020 1304 Last data filed at 09/28/2020 0950 Gross per 24 hour  Intake 2465.42 ml  Output 650 ml  Net 1815.42 ml   Filed Weights   09/24/20 2026 09/24/20 2041 09/26/20 1805  Weight: (!) 141.4 kg (!) 141.4 kg (!) 145.5 kg    Examination:   General: Not in pain or dyspnea, deconditioned  Neurology: Awake and alert, non focal  E ENT: no pallor, no icterus, oral mucosa moist Cardiovascular: No JVD. S1-S2 present, rhythmic, no gallops, rubs, or murmurs. No lower extremity edema. Pulmonary: positive breath sounds bilaterally, adequate air movement, no wheezing, rhonchi or rales. Gastrointestinal. Abdomen distended and tympanic, not tender to superficial palpation Skin. No rashes Musculoskeletal: no joint deformities     Data Reviewed: I have personally reviewed following labs and imaging studies  CBC: Recent Labs  Lab 09/22/20 0146 09/25/20 0344 09/26/20 0250 09/27/20 0544  WBC 8.0 22.0* 17.4* 13.4*  NEUTROABS  --   --  12.3* 8.7*  HGB 13.7 12.9* 12.3* 12.5*  HCT 39.5 38.6* 35.9* 36.9*  MCV 107.6* 108.1* 107.2* 106.3*  PLT 116* 329 296 333   Basic Metabolic Panel: Recent Labs  Lab 09/22/20 0146 09/23/20 0323 09/24/20 0311 09/25/20 0344 09/26/20 0250 09/27/20 0544 09/28/20 0613  NA 132* 132* 132* 137 138 136 137  K 3.3* 3.6 3.1* 2.9* 2.6* 3.2* 3.1*  CL 102 107 108 104 102 104 104  CO2 20* 15* 14* 20* 24 23 24   GLUCOSE 170* 198* 167* 108* 86 119* 92  BUN 18 14 14 10 7  <5* <5*  CREATININE 0.82 0.79 0.76 0.85  0.67 0.63 0.70  CALCIUM 7.4* 7.8* 8.0* 8.1* 7.3* 7.1* 7.5*  MG 2.1 2.3 2.2 1.9 1.7 1.9  --   PHOS 2.7 3.5  --  3.2 4.1  --   --    GFR: Estimated Creatinine Clearance: 151 mL/min (by C-G formula based on SCr of 0.7 mg/dL). Liver Function Tests: Recent Labs  Lab 09/22/20 0146 09/23/20 0323 09/25/20 0344 09/26/20 0250  AST  --   --   --  29  ALT  --   --   --  25  ALKPHOS  --   --   --  100  BILITOT  --   --   --  1.2  PROT  --   --   --  5.3*  ALBUMIN 2.3* 2.4* 2.4* 2.3*   Recent Labs  Lab 09/26/20 0250  LIPASE 23   No results for input(s): AMMONIA in the last 168  hours. Coagulation Profile: No results for input(s): INR, PROTIME in the last 168 hours. Cardiac Enzymes: No results for input(s): CKTOTAL, CKMB, CKMBINDEX, TROPONINI in the last 168 hours. BNP (last 3 results) No results for input(s): PROBNP in the last 8760 hours. HbA1C: No results for input(s): HGBA1C in the last 72 hours. CBG: Recent Labs  Lab 09/27/20 1725 09/27/20 2352 09/28/20 0538 09/28/20 0855 09/28/20 1118  GLUCAP 124* 108* 111* 107* 113*   Lipid Profile: No results for input(s): CHOL, HDL, LDLCALC, TRIG, CHOLHDL, LDLDIRECT in the last 72 hours. Thyroid Function Tests: No results for input(s): TSH, T4TOTAL, FREET4, T3FREE, THYROIDAB in the last 72 hours. Anemia Panel: No results for input(s): VITAMINB12, FOLATE, FERRITIN, TIBC, IRON, RETICCTPCT in the last 72 hours.    Radiology Studies: I have reviewed all of the imaging during this hospital visit personally     Scheduled Meds:  bisacodyl  10 mg Rectal Once   chlorhexidine  15 mL Mouth Rinse BID   Chlorhexidine Gluconate Cloth  6 each Topical Daily   enoxaparin (LOVENOX) injection  140 mg Subcutaneous Q12H   insulin aspart  0-20 Units Subcutaneous Q6H   insulin glargine  12 Units Subcutaneous Q24H   mouth rinse  15 mL Mouth Rinse q12n4p   mupirocin ointment  1 application Nasal BID   nortriptyline  10 mg Oral Daily    nortriptyline  20 mg Oral QHS   pantoprazole  40 mg Oral Daily   potassium chloride  40 mEq Oral Q4H   thiamine  100 mg Oral Daily   Continuous Infusions:  lactated ringers 75 mL/hr at 09/28/20 0000   meropenem (MERREM) IV 1 g (09/28/20 0850)     LOS: 10 days        Schuyler Behan Annett Gula, MD

## 2020-09-28 NOTE — Progress Notes (Signed)
Subjective: Abdominal pain and distention slightly better. Feels hungry. Passing flatus and loose stools.  Objective: Vital signs in last 24 hours: Temp:  [98 F (36.7 C)-98.9 F (37.2 C)] 98.2 F (36.8 C) (07/29 1333) Pulse Rate:  [83-85] 85 (07/29 1333) Resp:  [18] 18 (07/29 1333) BP: (133-138)/(83-91) 136/83 (07/29 1333) SpO2:  [95 %-99 %] 95 % (07/29 1333) Weight change:  Last BM Date: 09/26/20  PE: GEN:  Overweight ABD:  protuberant, tympanic, mild tenderness without peritonitis  Lab Results: CBC    Component Value Date/Time   WBC 13.4 (H) 09/27/2020 0544   RBC 3.47 (L) 09/27/2020 0544   HGB 12.5 (L) 09/27/2020 0544   HGB 14.8 07/12/2020 1341   HCT 36.9 (L) 09/27/2020 0544   HCT 40.9 07/12/2020 1341   PLT 333 09/27/2020 0544   PLT 206 07/12/2020 1341   MCV 106.3 (H) 09/27/2020 0544   MCV 110 (H) 07/12/2020 1341   MCH 36.0 (H) 09/27/2020 0544   MCHC 33.9 09/27/2020 0544   RDW 13.3 09/27/2020 0544   RDW 14.4 07/12/2020 1341   LYMPHSABS 3.5 09/27/2020 0544   MONOABS 0.9 09/27/2020 0544   EOSABS 0.1 09/27/2020 0544   BASOSABS 0.1 09/27/2020 0544  CMP     Component Value Date/Time   NA 137 09/28/2020 0613   NA 137 07/12/2020 1341   K 3.1 (L) 09/28/2020 0613   CL 104 09/28/2020 0613   CO2 24 09/28/2020 0613   GLUCOSE 92 09/28/2020 0613   BUN <5 (L) 09/28/2020 0613   BUN 7 07/12/2020 1341   CREATININE 0.70 09/28/2020 0613   CALCIUM 7.5 (L) 09/28/2020 0613   PROT 5.3 (L) 09/26/2020 0250   PROT 7.0 07/12/2020 1341   ALBUMIN 2.3 (L) 09/26/2020 0250   ALBUMIN 4.4 07/12/2020 1341   AST 29 09/26/2020 0250   ALT 25 09/26/2020 0250   ALKPHOS 100 09/26/2020 0250   BILITOT 1.2 09/26/2020 0250   BILITOT 0.6 07/12/2020 1341   GFRNONAA >60 09/28/2020 0613   GFRAA >60 08/17/2019 1832   Assessment:   Pancreatitis with suspected necrosis +/- infection.  Idiopathic, but most likely alcohol (MCV elevated).  Plan:   Advance to full liquid diet.  Continue  antibiotics. Alcohol cessation key. OOBTC, ambulate as tolerated. Follow abdominal exam clinically; don't see need for nasogastric tube at this point. [facility] will follow.   Freddy Jaksch 09/28/2020, 2:00 PM   Cell [phone] If no answer or after 5 PM call [phone]

## 2020-09-28 NOTE — Progress Notes (Signed)
PT Cancellation Note  Patient Details Name: Bryce Mendoza MRN: [id number] DOB: 1961/02/13   Cancelled Treatment:    Reason Eval/Treat Not Completed: PT screened, no needs identified, will sign off (PT eval completed 7/24 and no acute needs, RN reports no decline/change in mobility. Acute PT signing off.)  Wynn Maudlin, DPT Acute Rehabilitation Services Office [phone] Pager [phone]

## 2020-09-28 NOTE — Progress Notes (Signed)
OT Cancellation Note  Patient Details Name: Bryce Mendoza MRN: [id number] DOB: 12/03/1960   Cancelled Treatment:    Reason Eval/Treat Not Completed: Other (comment) OT evaluation completed on 7/25 with all education completed and patient ambulating at modified independent level with rolling walker. Per chart review patient has ambulated with mobility specialist and been out of bed to chair with nursing. Would continue to encourage ambulation and out of bed with nursing staff/mobility specialist. No acute OT needs at this time.   Marlyce Huge OT OT pager: [phone]   Carmelia Roller 09/28/2020, 6:31 AM

## 2020-09-28 NOTE — Plan of Care (Signed)

## 2020-09-29 DIAGNOSIS — R140 Abdominal distension (gaseous): Secondary | ICD-10-CM | POA: Diagnosis not present

## 2020-09-29 DIAGNOSIS — F1029 Alcohol dependence with unspecified alcohol-induced disorder: Secondary | ICD-10-CM | POA: Diagnosis not present

## 2020-09-29 DIAGNOSIS — E872 Acidosis: Secondary | ICD-10-CM | POA: Diagnosis not present

## 2020-09-29 DIAGNOSIS — K8500 Idiopathic acute pancreatitis without necrosis or infection: Secondary | ICD-10-CM | POA: Diagnosis not present

## 2020-09-29 LAB — CBC
HCT: 37.7 % — ABNORMAL LOW (ref 39.0–52.0)
Hemoglobin: 12.4 g/dL — ABNORMAL LOW (ref 13.0–17.0)
MCH: 36.7 pg — ABNORMAL HIGH (ref 26.0–34.0)
MCHC: 32.9 g/dL (ref 30.0–36.0)
MCV: 111.5 fL — ABNORMAL HIGH (ref 80.0–100.0)
Platelets: 326 10*3/uL (ref 150–400)
RBC: 3.38 MIL/uL — ABNORMAL LOW (ref 4.22–5.81)
RDW: 13.4 % (ref 11.5–15.5)
WBC: 11.2 10*3/uL — ABNORMAL HIGH (ref 4.0–10.5)
nRBC: 0 % (ref 0.0–0.2)

## 2020-09-29 LAB — BASIC METABOLIC PANEL
Anion gap: 6 (ref 5–15)
BUN: <5 mg/dL — ABNORMAL LOW (ref 6–20)
CO2: 24 mmol/L (ref 22–32)
Calcium: 7.6 mg/dL — ABNORMAL LOW (ref 8.9–10.3)
Chloride: 106 mmol/L (ref 98–111)
Creatinine, Ser: 0.76 mg/dL (ref 0.61–1.24)
GFR, Estimated: >60 mL/min (ref >60)
Glucose, Bld: 125 mg/dL — ABNORMAL HIGH (ref 70–99)
Potassium: 4 mmol/L (ref 3.5–5.1)
Sodium: 136 mmol/L (ref 135–145)

## 2020-09-29 LAB — MAGNESIUM: Magnesium: 2 mg/dL (ref 1.7–2.4)

## 2020-09-29 LAB — GLUCOSE, CAPILLARY
Glucose-Capillary: 123 mg/dL — ABNORMAL HIGH (ref 70–99)
Glucose-Capillary: 127 mg/dL — ABNORMAL HIGH (ref 70–99)
Glucose-Capillary: 131 mg/dL — ABNORMAL HIGH (ref 70–99)
Glucose-Capillary: 154 mg/dL — ABNORMAL HIGH (ref 70–99)
Glucose-Capillary: 91 mg/dL (ref 70–99)

## 2020-09-29 MED ORDER — APIXABAN 5 MG PO TABS: 5.0000 mg | ORAL_TABLET | Freq: Two times a day (BID) | ORAL | Status: DC

## 2020-09-29 MED ORDER — DIPHENHYDRAMINE HCL 25 MG PO CAPS
25.0000 mg | ORAL_CAPSULE | Freq: Four times a day (QID) | ORAL | Status: DC | PRN
  Administered 2020-09-30 – 2020-10-01 (×2): 25 mg via ORAL
  Filled 2020-09-29 (×3): qty 1

## 2020-09-29 MED ORDER — APIXABAN 5 MG PO TABS
5.0000 mg | ORAL_TABLET | Freq: Two times a day (BID) | ORAL | Status: DC
  Administered 2020-09-29 – 2020-10-09 (×20): 5 mg via ORAL
  Filled 2020-09-29 (×20): qty 1

## 2020-09-29 NOTE — Progress Notes (Signed)
[facility] Progress Note  [name] 60 y.o. 1960/08/29   Subjective: Loose stools. Abd pain ok. On soft diet.  Objective: Vital signs: Vitals:   09/29/20 0517 09/29/20 1345  BP: 116/87 125/83  Pulse: 84 87  Resp: 18 18  Temp: 98.2 F (36.8 C) 98.2 F (36.8 C)  SpO2: 98% 99%    Physical Exam: Gen: alert, no acute distress, obese HEENT: anicteric sclera CV: RRR Chest: CTA B Abd: epigastric tenderness with guarding, soft, nondistended, +BS Ext: no edema  Lab Results: Recent Labs    09/27/20 0544 09/28/20 0613 09/29/20 0648  NA 136 137 136  K 3.2* 3.1* 4.0  CL 104 104 106  CO2 23 24 24   GLUCOSE 119* 92 125*  BUN <5* <5* <5*  CREATININE 0.63 0.70 0.76  CALCIUM 7.1* 7.5* 7.6*  MG 1.9  --  2.0   No results for input(s): AST, ALT, ALKPHOS, BILITOT, PROT, ALBUMIN in the last 72 hours. Recent Labs    09/27/20 0544 09/29/20 0648  WBC 13.4* 11.2*  NEUTROABS 8.7*  --   HGB 12.5* 12.4*  HCT 36.9* 37.7*  MCV 106.3* 111.5*  PLT 333 326      Assessment/Plan: Acute necrotizing pancreatitis likely due to alcohol. Loose stools with recent ileus. Low fat diet. Supportive care. F/U with Dr. 10/01/20 in 4-6 weeks. Will sign off. Call if questions.   [name] 09/29/2020, 3:54 PM  Questions please call [phone] Patient ID: Bryce Mendoza, male   DOB: 11-30-1960, 60 y.o.   MRN: [id number]

## 2020-09-29 NOTE — Progress Notes (Signed)
Pharmacy Antibiotic Note  Bryce Mendoza is a 60 y.o. male admitted on 09/17/2020 with necrotizing pancreatitis.  Pharmacy has been consulted for meropenem dosing (previously on cefepime).  Plan: Meropenem 1g IV q8 hr Consider using Cefepime + Flagyl in place of meropenem; both IDSA and ESCMID consider advanced-generation cephalosporins (in combination with metronidazole) acceptable treatment for infected necrotizing pancreatitis.    Height: 6\' 1"  (185.4 cm) Weight: (!) 145.5 kg (320 lb 12.3 oz) IBW/kg (Calculated) : 79.9  Temp (24hrs), Avg:98.2 F (36.8 C), Min:98.1 F (36.7 C), Max:98.2 F (36.8 C)  Recent Labs  Lab 09/25/20 0344 09/26/20 0250 09/27/20 0544 09/28/20 0613 09/29/20 0648  WBC 22.0* 17.4* 13.4*  --  11.2*  CREATININE 0.85 0.67 0.63 0.70 0.76     Estimated Creatinine Clearance: 151 mL/min (by C-G formula based on SCr of 0.76 mg/dL).    No Known Allergies  Antimicrobials this admission: 7/26 cefepime >> 7/27 meropenem >>   Dose adjustments this admission: n/a  Microbiology results: 7/26 MRSA PCR: positive 7/19 MRSA PCR: positive  Thank you for allowing pharmacy to be a part of this patient's care.  8/19 PharmD, BCPS Clinical Pharmacist [facility] main pharmacy [phone] 09/29/2020 8:55 AM

## 2020-09-29 NOTE — Evaluation (Signed)
Physical Therapy Evaluation Patient Details Name: Bryce Mendoza MRN: [id number] DOB: [birth year] Today's Date: 09/29/2020   History of Present Illness  Pt is a 60 year old gentleman who presented on 09/17/20 to [facility] with epigastric pain and transferred to [facility] for acute pancreatitis and new onset DM in DKA.  CT abdomen and pelvis done consistent with acute pancreatitis without pseudocyst or abscess formation, no evidence of necrosis or hemorrhage.  Pt developed ileus. Pt with history of DVT and PE(2019) on Eliquis, hyperlipidemia, idiopathic severe polyneuropathy  Clinical Impression   Pt evaluated on 09/23/20.  Received reconsult 09/27/20 and was screened.  Received another reconsult 09/28/20 and performed Evaluation today.  Pt has been ambulating 2-3 x day in hall independently.  Today, he ambulated 250' in hallway with supervision safely.  He does have gait deviations of wide BOS, slow speed, and DOE but report these are his baseline -especially if he doesn't use his RW.  Advised pt to continue to do at least 3 hallway walks a day and also up and moving in room every 1-2 hours -safe to do independently.  Recommended use of RW in hallway for increased safety and endurance.  Pt does not require skilled therapy - he is at his baseline and ambulating independently.     Follow Up Recommendations No PT follow up    Equipment Recommendations  None recommended by PT    Recommendations for Other Services       Precautions / Restrictions Precautions Precautions: None Restrictions Weight Bearing Restrictions: No      Mobility  Bed Mobility Overal bed mobility: Modified Independent             General bed mobility comments: HOB elevated, uses rail, no physical assist    Transfers Overall transfer level: Independent Equipment used: None Transfers: Sit to/from Stand Sit to Stand: Independent         General transfer comment: Performed safely  without assist - has been doing independently  Ambulation/Gait Ambulation/Gait assistance: Supervision Gait Distance (Feet): 200 Feet Assistive device: None Gait Pattern/deviations: Step-through pattern;Wide base of support;Decreased stride length Gait velocity: slight decrease - this is his baseline   General Gait Details: Pt with wide base of support - baseline.  He did get DOE of 2/4 but reports near baseline when he doesn't use RW. Reports this is his normal walk.  Advised to use RW in hallway for improved endurance and safety.  Could ambulate in room without AD.  Also, advised at least 3 hall walks per day and up every 1-2 hours during the day in room to promote improved bowel function.  Stairs            Wheelchair Mobility    Modified Rankin (Stroke Patients Only)       Balance Overall balance assessment: Needs assistance Sitting-balance support: Feet supported Sitting balance-Leahy Scale: Good     Standing balance support: No upper extremity supported Standing balance-Leahy Scale: Good                               Pertinent Vitals/Pain Pain Assessment: Faces Faces Pain Scale: Hurts a little bit Pain Location: abdomen Pain Descriptors / Indicators: Discomfort Pain Intervention(s): Limited activity within patient's tolerance;Monitored during session    Home Living Family/patient expects to be discharged to:: Private residence Living Arrangements: Alone Available Help at Discharge: Available PRN/intermittently Type of Home: House Home Access: Level entry  Home Layout: Two level;Able to live on main level with bedroom/bathroom Home Equipment: Dan Humphreys - 2 wheels;Cane - single point      Prior Function Level of Independence: Independent with assistive device(s)         Comments: Pt stays downstairs but looking for a 1 level place.  He walks in house without AD and uses RW for longer distance (to mailbox).  Independent with ADLs.  Pt  reports limitations at baseline due to autoimmune disease. Reports rare falls -"when I get sloppy or in a hurry"     Hand Dominance   Dominant Hand: Right    Extremity/Trunk Assessment   Upper Extremity Assessment Upper Extremity Assessment: Overall WFL for tasks assessed    Lower Extremity Assessment Lower Extremity Assessment: Overall WFL for tasks assessed;LLE deficits/detail LLE Deficits / Details: Did note slight decrease motion in L ankle - reports baseline from multiple fractures/surgeries    Cervical / Trunk Assessment Cervical / Trunk Assessment: Normal  Communication   Communication: No difficulties  Cognition Arousal/Alertness: Awake/alert Behavior During Therapy: WFL for tasks assessed/performed Overall Cognitive Status: Within Functional Limits for tasks assessed                                        General Comments      Exercises     Assessment/Plan    PT Assessment Patent does not need any further PT services  PT Problem List         PT Treatment Interventions      PT Goals (Current goals can be found in the Care Plan section)  Acute Rehab PT Goals Patient Stated Goal: return home PT Goal Formulation: All assessment and education complete, DC therapy    Frequency     Barriers to discharge        Co-evaluation               AM-PAC PT "6 Clicks" Mobility  Outcome Measure Help needed turning from your back to your side while in a flat bed without using bedrails?: None Help needed moving from lying on your back to sitting on the side of a flat bed without using bedrails?: None Help needed moving to and from a bed to a chair (including a wheelchair)?: None Help needed standing up from a chair using your arms (e.g., wheelchair or bedside chair)?: None Help needed to walk in hospital room?: None Help needed climbing 3-5 steps with a railing? : A Little 6 Click Score: 23    End of Session Equipment Utilized During  Treatment: Gait belt Activity Tolerance: Patient tolerated treatment well Patient left: with call bell/phone within reach;in bed Nurse Communication: Mobility status      Time: 5366-4403 PT Time Calculation (min) (ACUTE ONLY): 20 min   Charges:   PT Evaluation $PT Eval Low Complexity: 1 Low          [name], PT Acute Rehab Services Pager [phone] [facility] [phone]   Rayetta Humphrey 09/29/2020, 11:27 AM

## 2020-09-29 NOTE — Progress Notes (Signed)
PROGRESS NOTE    Bryce Mendoza  WUJ:[id number] DOB: August 23, 1960 DOA: 09/17/2020 PCP: Claiborne Rigg, NP    Brief Narrative:  [name] was admitted to the hospital with a working diagnosis of acute severe pancreatitis, complicated with diabetes ketoacidosis and ileus.   60 year old male past medical history for DVT/PE, dyslipidemia, severe polyneuropathy who presented with epigastric abdominal pain.  Reported 2 days of abdominal pain, epigastric in location, radiated to his back.  Worse with movement or ambulation.  Because of persistent symptoms he presented to [facility] ED then transferred to [facility].  On his initial physical examination blood pressure 172/96, heart rate 103, respirate 23, temperature 97.5, oxygen saturation 96%, he had dry mucous membranes, his lungs were clear to auscultation bilaterally, heart S1-S2, present rhythmic, abdomen was soft and nontender, no lower extremity edema.   Sodium 129, potassium 4.0, chloride 89, bicarb 15, glucose 422, BUN 14, creatinine 0.86, lipase 2959, white count 22.4, hemoglobin 14.9, hematocrit 41.4, platelets 171.   CT of the abdomen show acute pancreatitis, no organized fluid collections.  Small wall thickening of duodenum.  Hepatic steatosis.             Patient was placed on intravenous fluids and IV insulin for diabetes ketoacidosis.   Follow-up CT showed severe acute interstitial edematous pancreatitis likely transitioning into necrotic pancreatitis.  Nearly glandular and peripancreatic necrosis. Patient was placed on meropenem for antibiotic therapy.   Patient has developed an ileus.    Assessment & Plan:   Principal Problem:   Acute pancreatitis Active Problems:   Idiopathic polyneuropathy   DKA (diabetic ketoacidosis) (HCC)   Mixed hyperlipidemia   Lactic acidosis   Elevated BP without diagnosis of hypertension   Alcohol dependence with unspecified alcohol-induced disorder (HCC)   Hypomagnesemia   Hypophosphatemia    Constipation   Abdominal distension   Acidosis, metabolic    Acute necrotizing pancreatitis/complicated with ileus  This am patient is feeling better, with improved abdominal pain, no nausea or vomiting. Continue passing gas and positive diarrhea.    Diet was advanced to soft with good toleration.    On IV  meropenem for necrotizing pancreatitis. Discontinue IV fluids and continue close monitoring of symptoms.  Continue to encourage to get out of bed to chair and ambulate in the hallway.    2. New onset  adult onset T1DM with DKA, hypoglycemia Fasting glucose this am is 125, continue with insulin sliding scale for glucose cover and monitoring. Continue basal insulin with 6 units.  Patient is tolerating po better.    3. Dyslipidemia and obesity class 3.  BMI is 41,4. Resume statin therapy.   4. Alcohol abuse. No clinical signs of acute withdrawal.    5. Hx of DVT and PE.  Resume apixaban for anticoagulation.    6. Hypokalemia and hypomagnesemia/ metabolic acidosis Electrolytes have been corrected with K at 4,0 and Mg at 2,0, Na is 136 and CL 106 with bicarbonate at 24. Continue close follow up of renal function and electrolytes in am.    7. Depression. On nortriptyline.      Status is: Inpatient  Remains inpatient appropriate because:Inpatient level of care appropriate due to severity of illness  Dispo: The patient is from: Home              Anticipated d/c is to: Home              Patient currently is not medically stable to d/c.   Difficult to place patient  No   DVT prophylaxis: Enoxaparin   Code Status:   full  Family Communication:  No family at the bedside      Consultants:  GI    Antimicrobials:  Meropenem.     Subjective: Patient is feeling better, abdominal pain has improved, no nausea or vomiting, continue with diarrhea and passing gas.   Objective: Vitals:   09/28/20 0536 09/28/20 1333 09/28/20 2107 09/29/20 0517  BP: (!) 133/91 136/83  114/77 116/87  Pulse: 83 85 81 84  Resp: 18 18 18 18   Temp: 98 F (36.7 C) 98.2 F (36.8 C) 98.1 F (36.7 C) 98.2 F (36.8 C)  TempSrc:  Oral  Oral  SpO2: 99% 95% 96% 98%  Weight:      Height:        Intake/Output Summary (Last 24 hours) at 09/29/2020 1213 Last data filed at 09/29/2020 0900 Gross per 24 hour  Intake 1236.41 ml  Output 1150 ml  Net 86.41 ml   Filed Weights   09/24/20 2026 09/24/20 2041 09/26/20 1805  Weight: (!) 141.4 kg (!) 141.4 kg (!) 145.5 kg    Examination:   General: Not in pain or dyspnea,  Neurology: Awake and alert, non focal , no tremors.  E ENT: no pallor, no icterus, oral mucosa moist Cardiovascular: No JVD. S1-S2 present, rhythmic, no gallops, rubs, or murmurs. No lower extremity edema. Pulmonary: positive breath sounds bilaterally, adequate air movement, no wheezing, rhonchi or rales. Gastrointestinal. Abdomen positive mild to moderate distention,  Non tender to superficial palpation, no rebound or guarding Skin. No rashes Musculoskeletal: no joint deformities     Data Reviewed: I have personally reviewed following labs and imaging studies  CBC: Recent Labs  Lab 09/25/20 0344 09/26/20 0250 09/27/20 0544 09/29/20 0648  WBC 22.0* 17.4* 13.4* 11.2*  NEUTROABS  --  12.3* 8.7*  --   HGB 12.9* 12.3* 12.5* 12.4*  HCT 38.6* 35.9* 36.9* 37.7*  MCV 108.1* 107.2* 106.3* 111.5*  PLT 329 296 333 326   Basic Metabolic Panel: Recent Labs  Lab 09/23/20 0323 09/24/20 0311 09/25/20 0344 09/26/20 0250 09/27/20 0544 09/28/20 0613 09/29/20 0648  NA 132* 132* 137 138 136 137 136  K 3.6 3.1* 2.9* 2.6* 3.2* 3.1* 4.0  CL 107 108 104 102 104 104 106  CO2 15* 14* 20* 24 23 24 24   GLUCOSE 198* 167* 108* 86 119* 92 125*  BUN 14 14 10 7  <5* <5* <5*  CREATININE 0.79 0.76 0.85 0.67 0.63 0.70 0.76  CALCIUM 7.8* 8.0* 8.1* 7.3* 7.1* 7.5* 7.6*  MG 2.3 2.2 1.9 1.7 1.9  --  2.0  PHOS 3.5  --  3.2 4.1  --   --   --    GFR: Estimated Creatinine  Clearance: 151 mL/min (by C-G formula based on SCr of 0.76 mg/dL). Liver Function Tests: Recent Labs  Lab 09/23/20 0323 09/25/20 0344 09/26/20 0250  AST  --   --  29  ALT  --   --  25  ALKPHOS  --   --  100  BILITOT  --   --  1.2  PROT  --   --  5.3*  ALBUMIN 2.4* 2.4* 2.3*   Recent Labs  Lab 09/26/20 0250  LIPASE 23   No results for input(s): AMMONIA in the last 168 hours. Coagulation Profile: No results for input(s): INR, PROTIME in the last 168 hours. Cardiac Enzymes: No results for input(s): CKTOTAL, CKMB, CKMBINDEX, TROPONINI in the last 168 hours. BNP (  last 3 results) No results for input(s): PROBNP in the last 8760 hours. HbA1C: No results for input(s): HGBA1C in the last 72 hours. CBG: Recent Labs  Lab 09/28/20 1118 09/28/20 1727 09/28/20 2341 09/29/20 0610 09/29/20 1121  GLUCAP 113* 125* 91 131* 123*   Lipid Profile: No results for input(s): CHOL, HDL, LDLCALC, TRIG, CHOLHDL, LDLDIRECT in the last 72 hours. Thyroid Function Tests: No results for input(s): TSH, T4TOTAL, FREET4, T3FREE, THYROIDAB in the last 72 hours. Anemia Panel: No results for input(s): VITAMINB12, FOLATE, FERRITIN, TIBC, IRON, RETICCTPCT in the last 72 hours.    Radiology Studies: I have reviewed all of the imaging during this hospital visit personally     Scheduled Meds:  bisacodyl  10 mg Rectal Once   chlorhexidine  15 mL Mouth Rinse BID   Chlorhexidine Gluconate Cloth  6 each Topical Daily   enoxaparin (LOVENOX) injection  140 mg Subcutaneous Q12H   insulin aspart  0-20 Units Subcutaneous Q6H   insulin glargine  6 Units Subcutaneous Q24H   mouth rinse  15 mL Mouth Rinse q12n4p   mupirocin ointment  1 application Nasal BID   nortriptyline  10 mg Oral Daily   nortriptyline  20 mg Oral QHS   pantoprazole  40 mg Oral Daily   Continuous Infusions:  lactated ringers 50 mL/hr at 09/29/20 0616   meropenem (MERREM) IV 1 g (09/29/20 0816)     LOS: 11 days         Shyna Duignan Annett Gula, MD

## 2020-09-30 DIAGNOSIS — K5900 Constipation, unspecified: Secondary | ICD-10-CM | POA: Diagnosis not present

## 2020-09-30 DIAGNOSIS — E872 Acidosis: Secondary | ICD-10-CM | POA: Diagnosis not present

## 2020-09-30 DIAGNOSIS — F1029 Alcohol dependence with unspecified alcohol-induced disorder: Secondary | ICD-10-CM | POA: Diagnosis not present

## 2020-09-30 DIAGNOSIS — K8500 Idiopathic acute pancreatitis without necrosis or infection: Secondary | ICD-10-CM | POA: Diagnosis not present

## 2020-09-30 LAB — BASIC METABOLIC PANEL
Anion gap: 7 (ref 5–15)
BUN: <5 mg/dL — ABNORMAL LOW (ref 6–20)
CO2: 24 mmol/L (ref 22–32)
Calcium: 7.6 mg/dL — ABNORMAL LOW (ref 8.9–10.3)
Chloride: 104 mmol/L (ref 98–111)
Creatinine, Ser: 0.77 mg/dL (ref 0.61–1.24)
GFR, Estimated: >60 mL/min (ref >60)
Glucose, Bld: 112 mg/dL — ABNORMAL HIGH (ref 70–99)
Potassium: 3.5 mmol/L (ref 3.5–5.1)
Sodium: 135 mmol/L (ref 135–145)

## 2020-09-30 LAB — GLUCOSE, CAPILLARY
Glucose-Capillary: 103 mg/dL — ABNORMAL HIGH (ref 70–99)
Glucose-Capillary: 164 mg/dL — ABNORMAL HIGH (ref 70–99)
Glucose-Capillary: 116 mg/dL — ABNORMAL HIGH (ref 70–99)
Glucose-Capillary: 144 mg/dL — ABNORMAL HIGH (ref 70–99)

## 2020-09-30 MED ORDER — HYDROMORPHONE HCL 1 MG/ML IJ SOLN
1.0000 mg | Freq: Four times a day (QID) | INTRAMUSCULAR | Status: DC | PRN
  Administered 2020-09-30 – 2020-10-06 (×23): 1 mg via INTRAVENOUS
  Filled 2020-09-30 (×23): qty 1

## 2020-09-30 MED ORDER — TRAZODONE HCL 50 MG PO TABS
50.0000 mg | ORAL_TABLET | Freq: Every day | ORAL | Status: DC
  Administered 2020-09-30 – 2020-10-02 (×3): 50 mg via ORAL
  Filled 2020-09-30 (×3): qty 1

## 2020-09-30 MED ORDER — OXYCODONE HCL 5 MG PO TABS
5.0000 mg | ORAL_TABLET | ORAL | Status: DC | PRN
  Administered 2020-09-30 – 2020-10-01 (×5): 5 mg via ORAL
  Filled 2020-09-30 (×5): qty 1

## 2020-09-30 NOTE — Progress Notes (Signed)
PROGRESS NOTE    Bryce Mendoza  SAY:[id number] DOB: 1960/05/28 DOA: 09/17/2020 PCP: Claiborne Rigg, NP    Brief Narrative:  Mr. Fowle was admitted to the hospital with a working diagnosis of acute severe pancreatitis, complicated with diabetes ketoacidosis and ileus.   60 year old male past medical history for DVT/PE, dyslipidemia, severe polyneuropathy who presented with epigastric abdominal pain.  Reported 2 days of abdominal pain, epigastric in location, radiated to his back.  Worse with movement or ambulation.  Because of persistent symptoms he presented to [facility] ED then transferred to [facility].  On his initial physical examination blood pressure 172/96, heart rate 103, respirate 23, temperature 97.5, oxygen saturation 96%, he had dry mucous membranes, his lungs were clear to auscultation bilaterally, heart S1-S2, present rhythmic, abdomen was soft and nontender, no lower extremity edema.   Sodium 129, potassium 4.0, chloride 89, bicarb 15, glucose 422, BUN 14, creatinine 0.86, lipase 2959, white count 22.4, hemoglobin 14.9, hematocrit 41.4, platelets 171.   CT of the abdomen show acute pancreatitis, no organized fluid collections.  Small wall thickening of duodenum.  Hepatic steatosis.             Patient was placed on intravenous fluids and IV insulin for diabetes ketoacidosis.   Follow-up CT showed severe acute interstitial edematous pancreatitis likely transitioning into necrotic pancreatitis.  Nearly glandular and peripancreatic necrosis. Patient was placed on meropenem for antibiotic therapy.   Patient has developed an ileus. With supportive medical care has been improving. Diet advanced with good toleration.    Assessment & Plan:   Principal Problem:   Acute pancreatitis Active Problems:   Idiopathic polyneuropathy   DKA (diabetic ketoacidosis) (HCC)   Mixed hyperlipidemia   Lactic acidosis   Elevated BP without diagnosis of hypertension   Alcohol dependence  with unspecified alcohol-induced disorder (HCC)   Hypomagnesemia   Hypophosphatemia   Constipation   Abdominal distension   Acidosis, metabolic   Acute necrotizing pancreatitis/complicated with ileus  Symptoms continue to improve, tolerating well soft diet. No nausea or vomiting. Continue to have abdominal pain but improved with analgesics Diarrhea improving.     IV  meropenem #5 for necrotizing pancreatitis.  Continue to encourage ambulation and out of bed to chair.  Continue with low fat soft diet and plan for possible dc home in 48 hrs.  Check cell count in am.  Decrease dose of hydromorphone to 1 mg and decrease frequency to as needed every 6 hrs.  Add oxycodone po for moderate pain in preparation for possible dc home in 48 hrs.    2. New onset  adult onset T1DM with DKA, hypoglycemia Glucose has remain stable with fasting glucose 112 mg/dl.   Basal insulin with 6 units and as needed sliding scale for glucose cover and monitoring.     3. Dyslipidemia and obesity class 3.  BMI is 41,4. On statin therapy.    4. Alcohol abuse. No current signs of acute withdrawal.    5. Hx of DVT and PE.  Continue with apixaban for anticoagulation.    6. Hypokalemia and hypomagnesemia/ metabolic acidosis Na 135 with K at 3,6 and bicarbonate at 24. Renal function with serum cr at 0,77.  Plan to add KCL today 40 meq and follow up electrolytes in am.  Off IV fluids.    7. Depression.  continue with nortriptyline.    Status is: Inpatient  Remains inpatient appropriate because:Inpatient level of care appropriate due to severity of illness  Dispo: The patient  is from: Home              Anticipated d/c is to: Home possible dc home in 48 hrs              Patient currently is not medically stable to d/c.   Difficult to place patient No   DVT prophylaxis: Apixaban   Code Status:    DNR Family Communication:  No  family at the bedside   Consultants:  GI   Antimicrobials:  Meropenem.      Subjective: Patient with improvement in diarrhea and abdominal pain but not yet back to baseline, he has been out of bed to chair and ambulating.  No nausea or vomiting. Pain controlled with analgesics.   Objective: Vitals:   09/29/20 0517 09/29/20 1345 09/29/20 2036 09/30/20 0533  BP: 116/87 125/83 129/75 118/76  Pulse: 84 87 81 89  Resp: 18 18 12 16   Temp: 98.2 F (36.8 C) 98.2 F (36.8 C) 99.3 F (37.4 C) 98.5 F (36.9 C)  TempSrc: Oral Oral Oral Oral  SpO2: 98% 99% 99% 98%  Weight:      Height:        Intake/Output Summary (Last 24 hours) at 09/30/2020 1033 Last data filed at 09/30/2020 0900 Gross per 24 hour  Intake 480 ml  Output --  Net 480 ml   Filed Weights   09/24/20 2026 09/24/20 2041 09/26/20 1805  Weight: (!) 141.4 kg (!) 141.4 kg (!) 145.5 kg    Examination:   General: Not in pain or dyspnea. Deconditioned  Neurology: Awake and alert, non focal  E ENT: mild pallor, no icterus, oral mucosa moist Cardiovascular: No JVD. S1-S2 present, rhythmic, no gallops, rubs, or murmurs. No lower extremity edema. Pulmonary: positive breath sounds bilaterally, adequate air movement, no wheezing, rhonchi or rales. Gastrointestinal. Abdomen mild distended but not tender to superficial palpation Skin. No rashes Musculoskeletal: no joint deformities     Data Reviewed: I have personally reviewed following labs and imaging studies  CBC: Recent Labs  Lab 09/25/20 0344 09/26/20 0250 09/27/20 0544 09/29/20 0648  WBC 22.0* 17.4* 13.4* 11.2*  NEUTROABS  --  12.3* 8.7*  --   HGB 12.9* 12.3* 12.5* 12.4*  HCT 38.6* 35.9* 36.9* 37.7*  MCV 108.1* 107.2* 106.3* 111.5*  PLT 329 296 333 326   Basic Metabolic Panel: Recent Labs  Lab 09/24/20 0311 09/25/20 0344 09/26/20 0250 09/27/20 0544 09/28/20 0613 09/29/20 0648 09/30/20 0559  NA 132* 137 138 136 137 136 135  K 3.1* 2.9* 2.6* 3.2* 3.1* 4.0 3.5  CL 108 104 102 104 104 106 104  CO2 14* 20* 24 23 24 24 24    GLUCOSE 167* 108* 86 119* 92 125* 112*  BUN 14 10 7  <5* <5* <5* <5*  CREATININE 0.76 0.85 0.67 0.63 0.70 0.76 0.77  CALCIUM 8.0* 8.1* 7.3* 7.1* 7.5* 7.6* 7.6*  MG 2.2 1.9 1.7 1.9  --  2.0  --   PHOS  --  3.2 4.1  --   --   --   --    GFR: Estimated Creatinine Clearance: 151 mL/min (by C-G formula based on SCr of 0.77 mg/dL). Liver Function Tests: Recent Labs  Lab 09/25/20 0344 09/26/20 0250  AST  --  29  ALT  --  25  ALKPHOS  --  100  BILITOT  --  1.2  PROT  --  5.3*  ALBUMIN 2.4* 2.3*   Recent Labs  Lab 09/26/20 0250  LIPASE 23  No results for input(s): AMMONIA in the last 168 hours. Coagulation Profile: No results for input(s): INR, PROTIME in the last 168 hours. Cardiac Enzymes: No results for input(s): CKTOTAL, CKMB, CKMBINDEX, TROPONINI in the last 168 hours. BNP (last 3 results) No results for input(s): PROBNP in the last 8760 hours. HbA1C: No results for input(s): HGBA1C in the last 72 hours. CBG: Recent Labs  Lab 09/29/20 1121 09/29/20 1657 09/29/20 2039 09/29/20 2352 09/30/20 0536  GLUCAP 123* 127* 91 154* 103*   Lipid Profile: No results for input(s): CHOL, HDL, LDLCALC, TRIG, CHOLHDL, LDLDIRECT in the last 72 hours. Thyroid Function Tests: No results for input(s): TSH, T4TOTAL, FREET4, T3FREE, THYROIDAB in the last 72 hours. Anemia Panel: No results for input(s): VITAMINB12, FOLATE, FERRITIN, TIBC, IRON, RETICCTPCT in the last 72 hours.    Radiology Studies: I have reviewed all of the imaging during this hospital visit personally     Scheduled Meds:  apixaban  5 mg Oral BID   bisacodyl  10 mg Rectal Once   chlorhexidine  15 mL Mouth Rinse BID   Chlorhexidine Gluconate Cloth  6 each Topical Daily   insulin aspart  0-20 Units Subcutaneous Q6H   insulin glargine  6 Units Subcutaneous Q24H   mouth rinse  15 mL Mouth Rinse q12n4p   mupirocin ointment  1 application Nasal BID   nortriptyline  10 mg Oral Daily   nortriptyline  20 mg Oral  QHS   pantoprazole  40 mg Oral Daily   Continuous Infusions:  meropenem (MERREM) IV 1 g (09/30/20 0815)     LOS: 12 days        Rangel Echeverri Annett Gula, MD

## 2020-10-01 DIAGNOSIS — E872 Acidosis: Secondary | ICD-10-CM | POA: Diagnosis not present

## 2020-10-01 DIAGNOSIS — K5900 Constipation, unspecified: Secondary | ICD-10-CM | POA: Diagnosis not present

## 2020-10-01 DIAGNOSIS — K8500 Idiopathic acute pancreatitis without necrosis or infection: Secondary | ICD-10-CM | POA: Diagnosis not present

## 2020-10-01 DIAGNOSIS — F1029 Alcohol dependence with unspecified alcohol-induced disorder: Secondary | ICD-10-CM | POA: Diagnosis not present

## 2020-10-01 LAB — CBC
HCT: 34.9 % — ABNORMAL LOW (ref 39.0–52.0)
Hemoglobin: 11.5 g/dL — ABNORMAL LOW (ref 13.0–17.0)
MCH: 36.5 pg — ABNORMAL HIGH (ref 26.0–34.0)
MCHC: 33 g/dL (ref 30.0–36.0)
MCV: 110.8 fL — ABNORMAL HIGH (ref 80.0–100.0)
Platelets: 320 10*3/uL (ref 150–400)
RBC: 3.15 MIL/uL — ABNORMAL LOW (ref 4.22–5.81)
RDW: 13.4 % (ref 11.5–15.5)
WBC: 9.6 10*3/uL (ref 4.0–10.5)
nRBC: 0 % (ref 0.0–0.2)

## 2020-10-01 LAB — GLUCOSE, CAPILLARY
Glucose-Capillary: 146 mg/dL — ABNORMAL HIGH (ref 70–99)
Glucose-Capillary: 153 mg/dL — ABNORMAL HIGH (ref 70–99)
Glucose-Capillary: 172 mg/dL — ABNORMAL HIGH (ref 70–99)
Glucose-Capillary: 127 mg/dL — ABNORMAL HIGH (ref 70–99)
Glucose-Capillary: 148 mg/dL — ABNORMAL HIGH (ref 70–99)

## 2020-10-01 LAB — BASIC METABOLIC PANEL
Anion gap: 6 (ref 5–15)
BUN: <5 mg/dL — ABNORMAL LOW (ref 6–20)
CO2: 24 mmol/L (ref 22–32)
Calcium: 7.9 mg/dL — ABNORMAL LOW (ref 8.9–10.3)
Chloride: 106 mmol/L (ref 98–111)
Creatinine, Ser: 0.77 mg/dL (ref 0.61–1.24)
GFR, Estimated: >60 mL/min (ref >60)
Glucose, Bld: 135 mg/dL — ABNORMAL HIGH (ref 70–99)
Potassium: 3.6 mmol/L (ref 3.5–5.1)
Sodium: 136 mmol/L (ref 135–145)

## 2020-10-01 MED ORDER — OXYCODONE HCL 5 MG PO TABS
10.0000 mg | ORAL_TABLET | ORAL | Status: DC | PRN
Start: 2020-10-01 — End: 2020-10-09
  Administered 2020-10-01 – 2020-10-09 (×35): 10 mg via ORAL
  Filled 2020-10-01 (×35): qty 2

## 2020-10-01 MED ORDER — POTASSIUM CHLORIDE CRYS ER 10 MEQ PO TBCR
40.0000 meq | EXTENDED_RELEASE_TABLET | Freq: Once | ORAL | Status: AC
  Administered 2020-10-01: 40 meq via ORAL
  Filled 2020-10-01: qty 4

## 2020-10-01 MED ORDER — PANCRELIPASE (LIP-PROT-AMYL) 12000-38000 UNITS PO CPEP
12000.0000 [IU] | ORAL_CAPSULE | Freq: Three times a day (TID) | ORAL | Status: DC
  Administered 2020-10-01 – 2020-10-09 (×24): 12000 [IU] via ORAL
  Filled 2020-10-01 (×25): qty 1

## 2020-10-01 NOTE — Progress Notes (Signed)
PROGRESS NOTE    Bryce Mendoza  GNO:[id number] DOB: 23-Feb-1961 DOA: 09/17/2020 PCP: Claiborne Rigg, NP    Brief Narrative:  Mr. Baranowski was admitted to the hospital with a working diagnosis of acute severe pancreatitis, complicated with diabetes ketoacidosis and ileus.   60 year old male past medical history for DVT/PE, dyslipidemia, severe polyneuropathy who presented with epigastric abdominal pain.  Reported 2 days of abdominal pain, epigastric in location, radiated to his back.  Worse with movement or ambulation.  Because of persistent symptoms he presented to [place] ED then transferred to [facility].  On his initial physical examination blood pressure 172/96, heart rate 103, respirate 23, temperature 97.5, oxygen saturation 96%, he had dry mucous membranes, his lungs were clear to auscultation bilaterally, heart S1-S2, present rhythmic, abdomen was soft and nontender, no lower extremity edema.   Sodium 129, potassium 4.0, chloride 89, bicarb 15, glucose 422, BUN 14, creatinine 0.86, lipase 2959, white count 22.4, hemoglobin 14.9, hematocrit 41.4, platelets 171.   CT of the abdomen show acute pancreatitis, no organized fluid collections.  Small wall thickening of duodenum.  Hepatic steatosis.             Patient was placed on intravenous fluids and IV insulin for diabetes ketoacidosis.   Follow-up CT showed severe acute interstitial edematous pancreatitis likely transitioning into necrotic pancreatitis.  Nearly glandular and peripancreatic necrosis. Patient was placed on meropenem for antibiotic therapy.   Patient has developed an ileus. With supportive medical care has been improving. Diet advanced with good toleration.    Assessment & Plan:   Principal Problem:   Acute pancreatitis Active Problems:   Idiopathic polyneuropathy   DKA (diabetic ketoacidosis) (HCC)   Mixed hyperlipidemia   Lactic acidosis   Elevated BP without diagnosis of hypertension   Alcohol dependence  with unspecified alcohol-induced disorder (HCC)   Hypomagnesemia   Hypophosphatemia   Constipation   Abdominal distension   Acidosis, metabolic   Acute necrotizing pancreatitis/complicated with ileus  Today with worsening abdominal pain, continue to have diarrhea.     Continue with IV  meropenem #6/7 for necrotizing pancreatitis.  Continue pain control with hydromorphone for severe pain and oxycodone for moderate pain (increase to 10 mg as needed) Continue with oxycodone po for moderate pain control.  Encourage patient to ambulate and get out of bed. Change back to clears for now.  Add pancreatic enzymes. No leukocytosis   2. New onset  adult onset T1DM with DKA, hypoglycemia Fasting glucose is 135 mg/dl, patient with worsening abdominal pain today.   Continue with basal insulin with 6 units and as needed sliding scale for glucose cover and monitoring.     3. Dyslipidemia and obesity class 3.  BMI is 41,4. Continue with statin therapy.    4. Alcohol abuse. No signs of current withdrawal.    5. Hx of DVT and PE.  On apixaban for anticoagulation.    6. Hypokalemia and hypomagnesemia/ metabolic acidosis Renal function continue to be stable with serum cr at 0,77,. K is 3,6 and serum bicarbonate at 24. Add 40 meq Kcl today and follow up renal function and electrolytes in am.    7. Depression.  On nortriptyline.      Patient continue to be at high risk for worsening pancreatitis   Status is: Inpatient  Remains inpatient appropriate because:Inpatient level of care appropriate due to severity of illness  Dispo: The patient is from: Home  Anticipated d/c is to: Home              Patient currently is not medically stable to d/c.   Difficult to place patient No    DVT prophylaxis:  Apixaban   Code Status:    full  Family Communication:   No family at the bedside    Antimicrobials:   Meropenem.    Subjective: Patient had been improving until this am when  pain is worsening, positive worsening abdominal distention.  No nausea or vomiting.   Objective: Vitals:   09/30/20 0533 09/30/20 1347 09/30/20 2003 10/01/20 0523  BP: 118/76 (!) 132/92 140/85 (!) 130/91  Pulse: 89 81 91 83  Resp: 16 20 20 20   Temp: 98.5 F (36.9 C) 99.2 F (37.3 C) 98.1 F (36.7 C) 97.8 F (36.6 C)  TempSrc: Oral Oral Oral Oral  SpO2: 98% 98% 98% 98%  Weight:      Height:        Intake/Output Summary (Last 24 hours) at 10/01/2020 1146 Last data filed at 10/01/2020 12/01/2020 Gross per 24 hour  Intake 830 ml  Output 1027 ml  Net -197 ml   Filed Weights   09/24/20 2026 09/24/20 2041 09/26/20 1805  Weight: (!) 141.4 kg (!) 141.4 kg (!) 145.5 kg    Examination:   General: Not in pain or dyspnea, deconditioned  Neurology: Awake and alert, non focal  E ENT: mild pallor, no icterus, oral mucosa moist Cardiovascular: No JVD. S1-S2 present, rhythmic, no gallops, rubs, or murmurs. No lower extremity edema. Pulmonary: positive breath sounds bilaterally, adequate air movement, no wheezing, rhonchi or rales. Gastrointestinal. Abdomen distended, tender to deep palpation, no guarding or rebound. Skin. No rashes Musculoskeletal: no joint deformities     Data Reviewed: I have personally reviewed following labs and imaging studies  CBC: Recent Labs  Lab 09/25/20 0344 09/26/20 0250 09/27/20 0544 09/29/20 0648 10/01/20 0437  WBC 22.0* 17.4* 13.4* 11.2* 9.6  NEUTROABS  --  12.3* 8.7*  --   --   HGB 12.9* 12.3* 12.5* 12.4* 11.5*  HCT 38.6* 35.9* 36.9* 37.7* 34.9*  MCV 108.1* 107.2* 106.3* 111.5* 110.8*  PLT 329 296 333 326 320   Basic Metabolic Panel: Recent Labs  Lab 09/25/20 0344 09/26/20 0250 09/27/20 0544 09/28/20 0613 09/29/20 0648 09/30/20 0559 10/01/20 0437  NA 137 138 136 137 136 135 136  K 2.9* 2.6* 3.2* 3.1* 4.0 3.5 3.6  CL 104 102 104 104 106 104 106  CO2 20* 24 23 24 24 24 24   GLUCOSE 108* 86 119* 92 125* 112* 135*  BUN 10 7 <5* <5* <5* <5*  <5*  CREATININE 0.85 0.67 0.63 0.70 0.76 0.77 0.77  CALCIUM 8.1* 7.3* 7.1* 7.5* 7.6* 7.6* 7.9*  MG 1.9 1.7 1.9  --  2.0  --   --   PHOS 3.2 4.1  --   --   --   --   --    GFR: Estimated Creatinine Clearance: 151 mL/min (by C-G formula based on SCr of 0.77 mg/dL). Liver Function Tests: Recent Labs  Lab 09/25/20 0344 09/26/20 0250  AST  --  29  ALT  --  25  ALKPHOS  --  100  BILITOT  --  1.2  PROT  --  5.3*  ALBUMIN 2.4* 2.3*   Recent Labs  Lab 09/26/20 0250  LIPASE 23   No results for input(s): AMMONIA in the last 168 hours. Coagulation Profile: No results for input(s): INR, PROTIME  in the last 168 hours. Cardiac Enzymes: No results for input(s): CKTOTAL, CKMB, CKMBINDEX, TROPONINI in the last 168 hours. BNP (last 3 results) No results for input(s): PROBNP in the last 8760 hours. HbA1C: No results for input(s): HGBA1C in the last 72 hours. CBG: Recent Labs  Lab 09/30/20 1136 09/30/20 1643 09/30/20 2302 10/01/20 0511 10/01/20 1059  GLUCAP 164* 116* 144* 146* 153*   Lipid Profile: No results for input(s): CHOL, HDL, LDLCALC, TRIG, CHOLHDL, LDLDIRECT in the last 72 hours. Thyroid Function Tests: No results for input(s): TSH, T4TOTAL, FREET4, T3FREE, THYROIDAB in the last 72 hours. Anemia Panel: No results for input(s): VITAMINB12, FOLATE, FERRITIN, TIBC, IRON, RETICCTPCT in the last 72 hours.    Radiology Studies: I have reviewed all of the imaging during this hospital visit personally     Scheduled Meds:  apixaban  5 mg Oral BID   bisacodyl  10 mg Rectal Once   chlorhexidine  15 mL Mouth Rinse BID   Chlorhexidine Gluconate Cloth  6 each Topical Daily   insulin aspart  0-20 Units Subcutaneous Q6H   insulin glargine  6 Units Subcutaneous Q24H   mouth rinse  15 mL Mouth Rinse q12n4p   nortriptyline  10 mg Oral Daily   nortriptyline  20 mg Oral QHS   pantoprazole  40 mg Oral Daily   traZODone  50 mg Oral QHS   Continuous Infusions:  meropenem (MERREM)  IV 1 g (10/01/20 0812)     LOS: 13 days        Kerriann Kamphuis Annett Gula, MD

## 2020-10-02 DIAGNOSIS — F1029 Alcohol dependence with unspecified alcohol-induced disorder: Secondary | ICD-10-CM | POA: Diagnosis not present

## 2020-10-02 DIAGNOSIS — E872 Acidosis: Secondary | ICD-10-CM | POA: Diagnosis not present

## 2020-10-02 DIAGNOSIS — K8500 Idiopathic acute pancreatitis without necrosis or infection: Secondary | ICD-10-CM | POA: Diagnosis not present

## 2020-10-02 DIAGNOSIS — K5900 Constipation, unspecified: Secondary | ICD-10-CM | POA: Diagnosis not present

## 2020-10-02 LAB — BASIC METABOLIC PANEL
Anion gap: 6 (ref 5–15)
BUN: <5 mg/dL — ABNORMAL LOW (ref 6–20)
CO2: 25 mmol/L (ref 22–32)
Calcium: 8.1 mg/dL — ABNORMAL LOW (ref 8.9–10.3)
Chloride: 105 mmol/L (ref 98–111)
Creatinine, Ser: 0.6 mg/dL — ABNORMAL LOW (ref 0.61–1.24)
GFR, Estimated: >60 mL/min (ref >60)
Glucose, Bld: 140 mg/dL — ABNORMAL HIGH (ref 70–99)
Potassium: 3.7 mmol/L (ref 3.5–5.1)
Sodium: 136 mmol/L (ref 135–145)

## 2020-10-02 LAB — GLUCOSE, CAPILLARY
Glucose-Capillary: 139 mg/dL — ABNORMAL HIGH (ref 70–99)
Glucose-Capillary: 147 mg/dL — ABNORMAL HIGH (ref 70–99)
Glucose-Capillary: 140 mg/dL — ABNORMAL HIGH (ref 70–99)
Glucose-Capillary: 146 mg/dL — ABNORMAL HIGH (ref 70–99)

## 2020-10-02 MED ORDER — POTASSIUM CHLORIDE CRYS ER 10 MEQ PO TBCR
40.0000 meq | EXTENDED_RELEASE_TABLET | Freq: Once | ORAL | Status: AC
  Administered 2020-10-02: 40 meq via ORAL
  Filled 2020-10-02: qty 4

## 2020-10-02 NOTE — Progress Notes (Signed)
PROGRESS NOTE    Bryce Mendoza  KPT:[id number] DOB: 02-Aug-1960 DOA: 09/17/2020 PCP: Claiborne Rigg, NP    Brief Narrative:  Bryce Mendoza was admitted to the hospital with a working diagnosis of acute severe pancreatitis, complicated with diabetes ketoacidosis and ileus.   60 year old male past medical history for DVT/PE, dyslipidemia, severe polyneuropathy who presented with epigastric abdominal pain.  Reported 2 days of abdominal pain, epigastric in location, radiated to his back.  Worse with movement or ambulation.  Because of persistent symptoms he presented to [place] ED then transferred to [facility].  On his initial physical examination blood pressure 172/96, heart rate 103, respirate 23, temperature 97.5, oxygen saturation 96%, he had dry mucous membranes, his lungs were clear to auscultation bilaterally, heart S1-S2, present rhythmic, abdomen was soft and nontender, no lower extremity edema.   Sodium 129, potassium 4.0, chloride 89, bicarb 15, glucose 422, BUN 14, creatinine 0.86, lipase 2959, white count 22.4, hemoglobin 14.9, hematocrit 41.4, platelets 171.   CT of the abdomen show acute pancreatitis, no organized fluid collections.  Small wall thickening of duodenum.  Hepatic steatosis.             Patient was placed on intravenous fluids and IV insulin for diabetes ketoacidosis.   Follow-up CT showed severe acute interstitial edematous pancreatitis likely transitioning into necrotic pancreatitis.  Nearly glandular and peripancreatic necrosis. Patient was placed on meropenem for antibiotic therapy.   Patient has developed an ileus. With supportive medical care has been improving. Diet advanced with good toleration.    Assessment & Plan:   Principal Problem:   Acute pancreatitis Active Problems:   Idiopathic polyneuropathy   DKA (diabetic ketoacidosis) (HCC)   Mixed hyperlipidemia   Lactic acidosis   Elevated BP without diagnosis of hypertension   Alcohol dependence  with unspecified alcohol-induced disorder (HCC)   Hypomagnesemia   Hypophosphatemia   Constipation   Abdominal distension   Acidosis, metabolic     Acute necrotizing pancreatitis/complicated with ileus  Pain seems to be stable from yesterday, improved with higher dose of oxycodone, no nausea or vomiting.   Continue on clear liquid diet.    Completed antibiotic therapy with meropenem for necrotizing pancreatitis.  Pain control with hydromorphone and oxycodone.  Pancreatic enzymes and clear liquid diet. Continue to encourage ambulation in the hallway and out of bed to chair tid with meals.   Positive flatus and watery bowel movements.    2. New onset  adult onset T1DM with DKA, hypoglycemia  Continue glucose control with sliding scale and basal, fasting glucose this am 140, capillary 139 and 147.   Back on clear liquid diet.   3. Dyslipidemia and obesity class 3.  BMI is 41,4. Continue with statin therapy    4. Alcohol abuse. No clinical signs of current withdrawal.    5. Hx of DVT and PE.  Continue with apixaban for anticoagulation.    6. Hypokalemia and hypomagnesemia/ metabolic acidosis Stable renal function with serum cr at 0,60 with K is 3,7 and serum bicarbonate at 25.   Add 40 meq Kcl today Continue close follow up of electrolytes in am including Mg.     7. Depression.  Continue with nortriptyline.    Patient continue to be at high risk for worsening pancreatitis   Status is: Inpatient  Remains inpatient appropriate because:Inpatient level of care appropriate due to severity of illness  Dispo: The patient is from: Home  Anticipated d/c is to: Home              Patient currently is not medically stable to d/c.   Difficult to place patient No   DVT prophylaxis: Enoxaparin   Code Status:   DNR   Family Communication:  No family at the bedside     Consultants:  GI   Antimicrobials:  Completed meropenem.     Subjective: Patient continue  to have abdominal pain, mild improvement from yesterday but not yet back to baseline, He is back to clear liquids with no nausea or vomiting.   Objective: Vitals:   10/01/20 1227 10/01/20 2043 10/02/20 0513 10/02/20 1233  BP: (!) 148/88 128/86 120/78 121/89  Pulse: 86 90 77 75  Resp:  20 20 16   Temp: 98.2 F (36.8 C) 98.8 F (37.1 C) 97.8 F (36.6 C) 99 F (37.2 C)  TempSrc: Oral Oral Oral Oral  SpO2: 98% 98% 98% 96%  Weight:      Height:        Intake/Output Summary (Last 24 hours) at 10/02/2020 1414 Last data filed at 10/02/2020 0900 Gross per 24 hour  Intake 1459.87 ml  Output 1725 ml  Net -265.13 ml   Filed Weights   09/24/20 2026 09/24/20 2041 09/26/20 1805  Weight: (!) 141.4 kg (!) 141.4 kg (!) 145.5 kg    Examination:   General: Not in pain or dyspnea, deconditioned  Neurology: Awake and alert, non focal  E ENT: mild pallor, no icterus, oral mucosa moist Cardiovascular: No JVD. S1-S2 present, rhythmic, no gallops, rubs, or murmurs. + pitting bilateral lower extremity edema. Pulmonary: positive breath sounds bilaterally, adequate air movement, no wheezing, rhonchi or rales. Gastrointestinal. Abdomen distended but not tender to superficial palpation, continue to be tympanic Skin. No rashes Musculoskeletal: no joint deformities     Data Reviewed: I have personally reviewed following labs and imaging studies  CBC: Recent Labs  Lab 09/26/20 0250 09/27/20 0544 09/29/20 0648 10/01/20 0437  WBC 17.4* 13.4* 11.2* 9.6  NEUTROABS 12.3* 8.7*  --   --   HGB 12.3* 12.5* 12.4* 11.5*  HCT 35.9* 36.9* 37.7* 34.9*  MCV 107.2* 106.3* 111.5* 110.8*  PLT 296 333 326 320   Basic Metabolic Panel: Recent Labs  Lab 09/26/20 0250 09/27/20 0544 09/28/20 0613 09/29/20 0648 09/30/20 0559 10/01/20 0437 10/02/20 0437  NA 138 136 137 136 135 136 136  K 2.6* 3.2* 3.1* 4.0 3.5 3.6 3.7  CL 102 104 104 106 104 106 105  CO2 24 23 24 24 24 24 25   GLUCOSE 86 119* 92 125* 112*  135* 140*  BUN 7 <5* <5* <5* <5* <5* <5*  CREATININE 0.67 0.63 0.70 0.76 0.77 0.77 0.60*  CALCIUM 7.3* 7.1* 7.5* 7.6* 7.6* 7.9* 8.1*  MG 1.7 1.9  --  2.0  --   --   --   PHOS 4.1  --   --   --   --   --   --    GFR: Estimated Creatinine Clearance: 151 mL/min (A) (by C-G formula based on SCr of 0.6 mg/dL (L)). Liver Function Tests: Recent Labs  Lab 09/26/20 0250  AST 29  ALT 25  ALKPHOS 100  BILITOT 1.2  PROT 5.3*  ALBUMIN 2.3*   Recent Labs  Lab 09/26/20 0250  LIPASE 23   No results for input(s): AMMONIA in the last 168 hours. Coagulation Profile: No results for input(s): INR, PROTIME in the last 168 hours. Cardiac Enzymes: No  results for input(s): CKTOTAL, CKMB, CKMBINDEX, TROPONINI in the last 168 hours. BNP (last 3 results) No results for input(s): PROBNP in the last 8760 hours. HbA1C: No results for input(s): HGBA1C in the last 72 hours. CBG: Recent Labs  Lab 10/01/20 1306 10/01/20 1712 10/01/20 2304 10/02/20 0501 10/02/20 1136  GLUCAP 172* 127* 148* 139* 147*   Lipid Profile: No results for input(s): CHOL, HDL, LDLCALC, TRIG, CHOLHDL, LDLDIRECT in the last 72 hours. Thyroid Function Tests: No results for input(s): TSH, T4TOTAL, FREET4, T3FREE, THYROIDAB in the last 72 hours. Anemia Panel: No results for input(s): VITAMINB12, FOLATE, FERRITIN, TIBC, IRON, RETICCTPCT in the last 72 hours.    Radiology Studies: I have reviewed all of the imaging during this hospital visit personally     Scheduled Meds:  apixaban  5 mg Oral BID   bisacodyl  10 mg Rectal Once   chlorhexidine  15 mL Mouth Rinse BID   insulin aspart  0-20 Units Subcutaneous Q6H   insulin glargine  6 Units Subcutaneous Q24H   lipase/protease/amylase  12,000 Units Oral TID AC   nortriptyline  10 mg Oral Daily   nortriptyline  20 mg Oral QHS   pantoprazole  40 mg Oral Daily   traZODone  50 mg Oral QHS   Continuous Infusions:   LOS: 14 days        Katelin Kutsch Annett Gula,  MD

## 2020-10-03 DIAGNOSIS — K8500 Idiopathic acute pancreatitis without necrosis or infection: Secondary | ICD-10-CM | POA: Diagnosis not present

## 2020-10-03 DIAGNOSIS — E1110 Type 2 diabetes mellitus with ketoacidosis without coma: Secondary | ICD-10-CM

## 2020-10-03 DIAGNOSIS — R140 Abdominal distension (gaseous): Secondary | ICD-10-CM | POA: Diagnosis not present

## 2020-10-03 LAB — CBC
HCT: 37.4 % — ABNORMAL LOW (ref 39.0–52.0)
Hemoglobin: 12.3 g/dL — ABNORMAL LOW (ref 13.0–17.0)
MCH: 36.3 pg — ABNORMAL HIGH (ref 26.0–34.0)
MCHC: 32.9 g/dL (ref 30.0–36.0)
MCV: 110.3 fL — ABNORMAL HIGH (ref 80.0–100.0)
Platelets: 263 10*3/uL (ref 150–400)
RBC: 3.39 MIL/uL — ABNORMAL LOW (ref 4.22–5.81)
RDW: 13.3 % (ref 11.5–15.5)
WBC: 8.4 10*3/uL (ref 4.0–10.5)
nRBC: 0 % (ref 0.0–0.2)

## 2020-10-03 LAB — BASIC METABOLIC PANEL
Anion gap: 7 (ref 5–15)
BUN: <5 mg/dL — ABNORMAL LOW (ref 6–20)
CO2: 24 mmol/L (ref 22–32)
Calcium: 8.4 mg/dL — ABNORMAL LOW (ref 8.9–10.3)
Chloride: 104 mmol/L (ref 98–111)
Creatinine, Ser: 0.58 mg/dL — ABNORMAL LOW (ref 0.61–1.24)
GFR, Estimated: >60 mL/min (ref >60)
Glucose, Bld: 110 mg/dL — ABNORMAL HIGH (ref 70–99)
Potassium: 4 mmol/L (ref 3.5–5.1)
Sodium: 135 mmol/L (ref 135–145)

## 2020-10-03 LAB — GLUCOSE, CAPILLARY
Glucose-Capillary: 112 mg/dL — ABNORMAL HIGH (ref 70–99)
Glucose-Capillary: 142 mg/dL — ABNORMAL HIGH (ref 70–99)
Glucose-Capillary: 136 mg/dL — ABNORMAL HIGH (ref 70–99)
Glucose-Capillary: 120 mg/dL — ABNORMAL HIGH (ref 70–99)

## 2020-10-03 LAB — MAGNESIUM: Magnesium: 1.7 mg/dL (ref 1.7–2.4)

## 2020-10-03 MED ORDER — ZOLPIDEM TARTRATE 5 MG PO TABS
5.0000 mg | ORAL_TABLET | Freq: Every evening | ORAL | Status: DC | PRN
  Administered 2020-10-03 – 2020-10-08 (×6): 5 mg via ORAL
  Filled 2020-10-03 (×6): qty 1

## 2020-10-03 NOTE — Progress Notes (Signed)
PROGRESS NOTE    Bryce Mendoza  UJW:[id number] DOB: 11-Jun-1960 DOA: 09/17/2020 PCP: Claiborne Rigg, NP    Brief Narrative:  [name] was admitted to the hospital with a working diagnosis of acute severe pancreatitis, complicated with diabetes ketoacidosis and ileus.   60 year old male past medical history for DVT/PE, dyslipidemia, severe polyneuropathy who presented with epigastric abdominal pain.  Reported 2 days of abdominal pain, epigastric in location, radiated to his back.  Worse with movement or ambulation.  Because of persistent symptoms he presented to [facility] ED then transferred to [facility].  On his initial physical examination blood pressure 172/96, heart rate 103, respirate 23, temperature 97.5, oxygen saturation 96%, he had dry mucous membranes, his lungs were clear to auscultation bilaterally, heart S1-S2, present rhythmic, abdomen was soft and nontender, no lower extremity edema.  Sodium 129, potassium 4.0, chloride 89, bicarb 15, glucose 422, BUN 14, creatinine 0.86, lipase 2959, white count 22.4, hemoglobin 14.9, hematocrit 41.4, platelets 171.   CT of the abdomen show acute pancreatitis, no organized fluid collections.  Small wall thickening of duodenum.  Hepatic steatosis.             Patient was placed on intravenous fluids and IV insulin for diabetes ketoacidosis.   Follow-up CT showed severe acute interstitial edematous pancreatitis likely transitioning into necrotic pancreatitis.  Nearly glandular and peripancreatic necrosis. Patient was placed on meropenem for antibiotic therapy.    Assessment & Plan:   Principal Problem:   Acute pancreatitis Active Problems:   Idiopathic polyneuropathy   DKA (diabetic ketoacidosis) (HCC)   Mixed hyperlipidemia   Lactic acidosis   Elevated BP without diagnosis of hypertension   Alcohol dependence with unspecified alcohol-induced disorder (HCC)   Hypomagnesemia   Hypophosphatemia   Constipation   Abdominal distension    Acidosis, metabolic   Acute necrotizing pancreatitis/complicated with ileus: Patient with slow improvement, however stabilizing.  Tolerating clear liquid.  Will advance to soft diet.  Will monitor for dietary challenge. Completed antibiotic therapy with meropenem, no indication for further antibiotic therapy.  Supplemental pancreatic enzymes. Patient on oral opiates and IV opiates for pain relief, encouraged to titrate off IV pain medications and managed with oral opiates.  New onset  adult onset T2DM with DKA,  Untreated diabetes.  A1c 9.4.  Currently on long-acting insulin and prandial insulin.  Patient will continue to learn to use insulin for preparation of discharge home.  Hyperlipidemia: On statin therapy continue.  Alcohol abuse: Out of withdrawal window.  He is 15 days in the hospital.  Remains on multivitamins.  History of DVT and PE: Therapeutic on Eliquis.  Hypokalemia/hypomagnesemia: Replaced with adequate improvement.  Depression: On nortriptyline that he will continue to.   Status is: Inpatient  Remains inpatient appropriate because:Inpatient level of care appropriate due to severity of illness  Dispo: The patient is from: Home              Anticipated d/c is to: Home              Patient currently is not medically stable to d/c.   Difficult to place patient No   DVT prophylaxis: Enoxaparin   Code Status:   DNR   Family Communication:  No family at the bedside     Consultants:  GI   Antimicrobials:  Completed meropenem.     Subjective: Patient seen and examined.  Denies any nausea vomiting.  Abdomen pain comes and episodes no relation to food.  He has multiple episodes  of loose watery stool.   Objective: Vitals:   10/02/20 1233 10/02/20 2036 10/03/20 0501 10/03/20 1312  BP: 121/89 (!) 133/93 125/84 (!) 138/92  Pulse: 75 80 77 85  Resp: 16 20 20 17   Temp: 99 F (37.2 C) 98.8 F (37.1 C) 98.3 F (36.8 C) 98.4 F (36.9 C)  TempSrc: Oral Oral Oral  Oral  SpO2: 96% 98% 98% 98%  Weight:      Height:        Intake/Output Summary (Last 24 hours) at 10/03/2020 1651 Last data filed at 10/03/2020 1300 Gross per 24 hour  Intake 1560 ml  Output 1990 ml  Net -430 ml   Filed Weights   09/24/20 2026 09/24/20 2041 09/26/20 1805  Weight: (!) 141.4 kg (!) 141.4 kg (!) 145.5 kg    Examination:   General: Patient looks comfortable.  On room air.  Able to have good conversation without any distress or anxiety. Cardiovascular: S1-S2 normal.  No added sounds. Respiratory: Bilateral clear.  No added sounds. Gastrointestinal: Soft.  Obese and pendulous.  He has some mild tenderness along the epigastrium and upper quadrants with no rigidity or guarding.  Bowel sounds present. Ext: No cyanosis or edema. Neuro: ANO x4.  No focal deficits. Musculoskeletal: No deformities or joint swelling    Data Reviewed: I have personally reviewed following labs and imaging studies  CBC: Recent Labs  Lab 09/27/20 0544 09/29/20 0648 10/01/20 0437 10/03/20 0507  WBC 13.4* 11.2* 9.6 8.4  NEUTROABS 8.7*  --   --   --   HGB 12.5* 12.4* 11.5* 12.3*  HCT 36.9* 37.7* 34.9* 37.4*  MCV 106.3* 111.5* 110.8* 110.3*  PLT 333 326 320 263   Basic Metabolic Panel: Recent Labs  Lab 09/27/20 0544 09/28/20 0613 09/29/20 0648 09/30/20 0559 10/01/20 0437 10/02/20 0437 10/03/20 0507  NA 136   < > 136 135 136 136 135  K 3.2*   < > 4.0 3.5 3.6 3.7 4.0  CL 104   < > 106 104 106 105 104  CO2 23   < > 24 24 24 25 24   GLUCOSE 119*   < > 125* 112* 135* 140* 110*  BUN <5*   < > <5* <5* <5* <5* <5*  CREATININE 0.63   < > 0.76 0.77 0.77 0.60* 0.58*  CALCIUM 7.1*   < > 7.6* 7.6* 7.9* 8.1* 8.4*  MG 1.9  --  2.0  --   --   --  1.7   < > = values in this interval not displayed.   GFR: Estimated Creatinine Clearance: 151 mL/min (A) (by C-G formula based on SCr of 0.58 mg/dL (L)). Liver Function Tests: No results for input(s): AST, ALT, ALKPHOS, BILITOT, PROT, ALBUMIN in  the last 168 hours.  No results for input(s): LIPASE, AMYLASE in the last 168 hours.  No results for input(s): AMMONIA in the last 168 hours. Coagulation Profile: No results for input(s): INR, PROTIME in the last 168 hours. Cardiac Enzymes: No results for input(s): CKTOTAL, CKMB, CKMBINDEX, TROPONINI in the last 168 hours. BNP (last 3 results) No results for input(s): PROBNP in the last 8760 hours. HbA1C: No results for input(s): HGBA1C in the last 72 hours. CBG: Recent Labs  Lab 10/02/20 1136 10/02/20 1703 10/02/20 2301 10/03/20 0506 10/03/20 1125  GLUCAP 147* 140* 146* 112* 142*   Lipid Profile: No results for input(s): CHOL, HDL, LDLCALC, TRIG, CHOLHDL, LDLDIRECT in the last 72 hours. Thyroid Function Tests: No results for input(s):  TSH, T4TOTAL, FREET4, T3FREE, THYROIDAB in the last 72 hours. Anemia Panel: No results for input(s): VITAMINB12, FOLATE, FERRITIN, TIBC, IRON, RETICCTPCT in the last 72 hours.    Radiology Studies: I have reviewed all of the imaging during this hospital visit personally     Scheduled Meds:  apixaban  5 mg Oral BID   bisacodyl  10 mg Rectal Once   chlorhexidine  15 mL Mouth Rinse BID   insulin aspart  0-20 Units Subcutaneous Q6H   insulin glargine  6 Units Subcutaneous Q24H   lipase/protease/amylase  12,000 Units Oral TID AC   nortriptyline  10 mg Oral Daily   nortriptyline  20 mg Oral QHS   pantoprazole  40 mg Oral Daily   Continuous Infusions:   LOS: 15 days   Total time spent: 30 minutes     Dorcas Carrow, MD

## 2020-10-04 DIAGNOSIS — E1110 Type 2 diabetes mellitus with ketoacidosis without coma: Secondary | ICD-10-CM | POA: Diagnosis not present

## 2020-10-04 DIAGNOSIS — R140 Abdominal distension (gaseous): Secondary | ICD-10-CM | POA: Diagnosis not present

## 2020-10-04 DIAGNOSIS — K8500 Idiopathic acute pancreatitis without necrosis or infection: Secondary | ICD-10-CM | POA: Diagnosis not present

## 2020-10-04 LAB — GLUCOSE, CAPILLARY
Glucose-Capillary: 156 mg/dL — ABNORMAL HIGH (ref 70–99)
Glucose-Capillary: 116 mg/dL — ABNORMAL HIGH (ref 70–99)
Glucose-Capillary: 119 mg/dL — ABNORMAL HIGH (ref 70–99)
Glucose-Capillary: 139 mg/dL — ABNORMAL HIGH (ref 70–99)

## 2020-10-04 NOTE — Progress Notes (Signed)
PROGRESS NOTE    Bryce Mendoza  BOF:[id number] DOB: [birth year] DOA: 09/17/2020 PCP: Claiborne Rigg, NP    Brief Narrative:  Mr. Lennartz was admitted to the hospital with a working diagnosis of acute severe pancreatitis, complicated with diabetes ketoacidosis and ileus.   60 year old male past medical history for DVT/PE, dyslipidemia, severe polyneuropathy who presented with epigastric abdominal pain.  Reported 2 days of abdominal pain, epigastric in location, radiated to his back.  Worse with movement or ambulation.  Because of persistent symptoms he presented to [facility] ED then transferred to [facility].  On his initial physical examination blood pressure 172/96, heart rate 103, respirate 23, temperature 97.5, oxygen saturation 96%, he had dry mucous membranes, his lungs were clear to auscultation bilaterally, heart S1-S2, present rhythmic, abdomen was soft and nontender, no lower extremity edema.  Sodium 129, potassium 4.0, chloride 89, bicarb 15, glucose 422, BUN 14, creatinine 0.86, lipase 2959, white count 22.4, hemoglobin 14.9, hematocrit 41.4, platelets 171.   CT of the abdomen show acute pancreatitis, no organized fluid collections.  Small wall thickening of duodenum.  Hepatic steatosis.             Patient was placed on intravenous fluids and IV insulin for diabetes ketoacidosis.   Follow-up CT showed severe acute interstitial edematous pancreatitis likely transitioning into necrotic pancreatitis.  Nearly glandular and peripancreatic necrosis. Patient was placed on meropenem for antibiotic therapy.    Assessment & Plan:   Principal Problem:   Acute pancreatitis Active Problems:   Idiopathic polyneuropathy   DKA (diabetic ketoacidosis) (HCC)   Mixed hyperlipidemia   Lactic acidosis   Elevated BP without diagnosis of hypertension   Alcohol dependence with unspecified alcohol-induced disorder (HCC)   Hypomagnesemia   Hypophosphatemia   Constipation   Abdominal distension    Acidosis, metabolic   Acute necrotizing pancreatitis/complicated with ileus: Slowly improving.  Bowel functions improving.  Tolerating soft diet.  Still with significant pain. De-escalate IV pain medications.  Encourage oral pain medications.  Continue to mobilize. On supplements with pancreatic enzymes.  New onset  adult onset T2DM with DKA,  Untreated diabetes.  A1c 9.4.  Currently on long-acting insulin and prandial insulin.  Patient will continue to learn to use insulin for preparation of discharge home.  Hyperlipidemia: On statin therapy continue.  Alcohol abuse: Out of withdrawal window.  He is 16 days in the hospital.  Remains on multivitamins.  History of DVT and PE: Therapeutic on Eliquis.  Hypokalemia/hypomagnesemia: Replaced with adequate improvement.  Depression: On nortriptyline that he will continue to.   Status is: Inpatient  Remains inpatient appropriate because:Inpatient level of care appropriate due to severity of illness.  Patient is still with significant pain.  Dispo: The patient is from: Home              Anticipated d/c is to: Home              Patient currently is not medically stable to d/c.   Difficult to place patient No   DVT prophylaxis: Eliquis Code Status:   DNR   Family Communication:  No family at the bedside     Consultants:  GI   Antimicrobials:  Completed meropenem.     Subjective: Patient seen and examined.  Yesterday after eating some [place] he had bloating and vomiting.  Was able to eat soft diet after that.  Slept well with Ambien without needing pain medications.   Objective: Vitals:   10/03/20 0501 10/03/20 1312 10/03/20 2022  10/04/20 0452  BP: 125/84 (!) 138/92 119/79 114/81  Pulse: 77 85 81 93  Resp: 20 17 14 12   Temp: 98.3 F (36.8 C) 98.4 F (36.9 C) 99.1 F (37.3 C) 99.1 F (37.3 C)  TempSrc: Oral Oral Oral Oral  SpO2: 98% 98% 100% 97%  Weight:      Height:        Intake/Output Summary (Last 24 hours) at  10/04/2020 1305 Last data filed at 10/04/2020 0730 Gross per 24 hour  Intake --  Output 925 ml  Net -925 ml   Filed Weights   09/24/20 2026 09/24/20 2041 09/26/20 1805  Weight: (!) 141.4 kg (!) 141.4 kg (!) 145.5 kg    Examination:   General: Comfortable at rest.  Slightly anxious on abdominal exam. Cardiovascular: S1-S2 normal.  No added sounds. Respiratory: Bilateral clear.  No added sounds. Gastrointestinal: Soft.  Obese and pendulous.  He has some mild tenderness along the epigastrium and upper quadrants with no rigidity or guarding.  Bowel sounds present. Ext: No cyanosis or edema. Neuro: ANO x4.  No focal deficits. Musculoskeletal: No deformities or joint swelling    Data Reviewed: I have personally reviewed following labs and imaging studies  CBC: Recent Labs  Lab 09/29/20 0648 10/01/20 0437 10/03/20 0507  WBC 11.2* 9.6 8.4  HGB 12.4* 11.5* 12.3*  HCT 37.7* 34.9* 37.4*  MCV 111.5* 110.8* 110.3*  PLT 326 320 263   Basic Metabolic Panel: Recent Labs  Lab 09/29/20 0648 09/30/20 0559 10/01/20 0437 10/02/20 0437 10/03/20 0507  NA 136 135 136 136 135  K 4.0 3.5 3.6 3.7 4.0  CL 106 104 106 105 104  CO2 24 24 24 25 24   GLUCOSE 125* 112* 135* 140* 110*  BUN <5* <5* <5* <5* <5*  CREATININE 0.76 0.77 0.77 0.60* 0.58*  CALCIUM 7.6* 7.6* 7.9* 8.1* 8.4*  MG 2.0  --   --   --  1.7   GFR: Estimated Creatinine Clearance: 151 mL/min (A) (by C-G formula based on SCr of 0.58 mg/dL (L)). Liver Function Tests: No results for input(s): AST, ALT, ALKPHOS, BILITOT, PROT, ALBUMIN in the last 168 hours.  No results for input(s): LIPASE, AMYLASE in the last 168 hours.  No results for input(s): AMMONIA in the last 168 hours. Coagulation Profile: No results for input(s): INR, PROTIME in the last 168 hours. Cardiac Enzymes: No results for input(s): CKTOTAL, CKMB, CKMBINDEX, TROPONINI in the last 168 hours. BNP (last 3 results) No results for input(s): PROBNP in the last 8760  hours. HbA1C: No results for input(s): HGBA1C in the last 72 hours. CBG: Recent Labs  Lab 10/03/20 1125 10/03/20 1726 10/03/20 2323 10/04/20 0524 10/04/20 1101  GLUCAP 142* 136* 120* 156* 116*   Lipid Profile: No results for input(s): CHOL, HDL, LDLCALC, TRIG, CHOLHDL, LDLDIRECT in the last 72 hours. Thyroid Function Tests: No results for input(s): TSH, T4TOTAL, FREET4, T3FREE, THYROIDAB in the last 72 hours. Anemia Panel: No results for input(s): VITAMINB12, FOLATE, FERRITIN, TIBC, IRON, RETICCTPCT in the last 72 hours.    Radiology Studies: I have reviewed all of the imaging during this hospital visit personally     Scheduled Meds:  apixaban  5 mg Oral BID   bisacodyl  10 mg Rectal Once   chlorhexidine  15 mL Mouth Rinse BID   insulin aspart  0-20 Units Subcutaneous Q6H   insulin glargine  6 Units Subcutaneous Q24H   lipase/protease/amylase  12,000 Units Oral TID AC   nortriptyline  10 mg Oral Daily   nortriptyline  20 mg Oral QHS   pantoprazole  40 mg Oral Daily   Continuous Infusions:   LOS: 16 days   Total time spent: 30 minutes     Dorcas Carrow, MD

## 2020-10-05 ENCOUNTER — Inpatient Hospital Stay (HOSPITAL_COMMUNITY): Payer: Medicaid Other

## 2020-10-05 ENCOUNTER — Encounter (HOSPITAL_COMMUNITY): Payer: Self-pay | Admitting: Family Medicine

## 2020-10-05 LAB — GLUCOSE, CAPILLARY
Glucose-Capillary: 119 mg/dL — ABNORMAL HIGH (ref 70–99)
Glucose-Capillary: 136 mg/dL — ABNORMAL HIGH (ref 70–99)
Glucose-Capillary: 154 mg/dL — ABNORMAL HIGH (ref 70–99)
Glucose-Capillary: 164 mg/dL — ABNORMAL HIGH (ref 70–99)

## 2020-10-05 MED ORDER — IOHEXOL 350 MG/ML SOLN
100.0000 mL | Freq: Once | INTRAVENOUS | Status: AC | PRN
  Administered 2020-10-05: 75 mL via INTRAVENOUS

## 2020-10-05 MED ORDER — IOHEXOL 9 MG/ML PO SOLN
ORAL | Status: AC
  Filled 2020-10-05: qty 1000

## 2020-10-05 MED ORDER — IOHEXOL 9 MG/ML PO SOLN
500.0000 mL | ORAL | Status: AC
Start: 2020-10-05 — End: 2020-10-05
  Administered 2020-10-05 (×2): 500 mL via ORAL

## 2020-10-05 NOTE — Progress Notes (Signed)
PROGRESS NOTE    Bryce Mendoza  QAS:[id number] DOB: Jul 08, 1960 DOA: 09/17/2020 PCP: Claiborne Rigg, NP    Brief Narrative:  Bryce Mendoza was admitted to the hospital with a working diagnosis of acute severe pancreatitis, complicated with diabetes ketoacidosis and ileus.   60 year old male past medical history for DVT/PE, dyslipidemia, severe polyneuropathy who presented with epigastric abdominal pain.  Reported 2 days of abdominal pain, epigastric in location, radiated to his back.  Worse with movement or ambulation.  Because of persistent symptoms he presented to [facility] ED then transferred to [facility].  On his initial physical examination blood pressure 172/96, heart rate 103, respirate 23, temperature 97.5, oxygen saturation 96%, he had dry mucous membranes, his lungs were clear to auscultation bilaterally, heart S1-S2, present rhythmic, abdomen was soft and nontender, no lower extremity edema.  Sodium 129, potassium 4.0, chloride 89, bicarb 15, glucose 422, BUN 14, creatinine 0.86, lipase 2959, white count 22.4, hemoglobin 14.9, hematocrit 41.4, platelets 171.   CT of the abdomen show acute pancreatitis, no organized fluid collections.  Small wall thickening of duodenum.  Hepatic steatosis.             Patient was placed on intravenous fluids and IV insulin for diabetes ketoacidosis.   Follow-up CT showed severe acute interstitial edematous pancreatitis likely transitioning into necrotic pancreatitis.  Nearly glandular and peripancreatic necrosis. Patient was placed on meropenem for antibiotic therapy.    Assessment & Plan:   Principal Problem:   Acute pancreatitis Active Problems:   Idiopathic polyneuropathy   DKA (diabetic ketoacidosis) (HCC)   Mixed hyperlipidemia   Lactic acidosis   Elevated BP without diagnosis of hypertension   Alcohol dependence with unspecified alcohol-induced disorder (HCC)   Hypomagnesemia   Hypophosphatemia   Constipation   Abdominal distension    Acidosis, metabolic   Acute necrotizing pancreatitis/complicated with ileus: Slowly improving.  Bowel functions improving.  Tolerating soft diet.  Still with significant pain. Is still needing IV opiates for pain relief. Patient with recurrent nausea and episodes of vomiting.  Bowel functions present.  We will recheck CT scan abdomen pelvis today with contrast.  Encourage oral pain medications.  Continue to mobilize. On supplements with pancreatic enzymes.  Recheck electrolytes tomorrow morning.  New onset  adult onset T2DM with DKA,  Untreated diabetes.  A1c 9.4.  Currently on long-acting insulin and prandial insulin.  Patient will continue to learn to use insulin for preparation of discharge home. Start learning insulin administration Today.  Hyperlipidemia: On statin therapy continue.  Alcohol abuse: Out of withdrawal window.  On multivitamins.  History of DVT and PE: Therapeutic on Eliquis.  Hypokalemia/hypomagnesemia: Replaced with adequate improvement.  Recheck tomorrow morning.  Depression: On nortriptyline that he will continue to.   Status is: Inpatient  Remains inpatient appropriate because:Inpatient level of care appropriate due to severity of illness.  Patient is still with significant pain.  Dispo: The patient is from: Home              Anticipated d/c is to: Home              Patient currently is not medically stable to d/c.  Continues to need IV pain medications and also intolerance to oral intake.   Difficult to place patient No   DVT prophylaxis: Eliquis Code Status:   DNR   Family Communication:  No family at the bedside     Consultants:  GI   Antimicrobials:  Completed meropenem.     Subjective:  Patient seen and examined.  He had 1 episode of vomiting overnight.  He tried to maintain his pain with oral pain medications, however could not keep up so used a few doses of IV Dilaudid.  Objective: Vitals:   10/04/20 0452 10/04/20 1324 10/04/20 2005  10/05/20 0552  BP: 114/81 115/75 (!) 142/92 124/82  Pulse: 93 82 89 83  Resp: 12 16 18 20   Temp: 99.1 F (37.3 C) 98.2 F (36.8 C) 98.5 F (36.9 C) 98 F (36.7 C)  TempSrc: Oral Oral Oral Oral  SpO2: 97% 98% (!) 83% 98%  Weight:      Height:        Intake/Output Summary (Last 24 hours) at 10/05/2020 1302 Last data filed at 10/05/2020 0935 Gross per 24 hour  Intake --  Output 550 ml  Net -550 ml   Filed Weights   09/24/20 2026 09/24/20 2041 09/26/20 1805  Weight: (!) 141.4 kg (!) 141.4 kg (!) 145.5 kg    Examination:   General: Comfortable at rest.  Anxious on exam. Cardiovascular: S1-S2 normal.  No added sounds. Respiratory: Bilateral clear.  No added sounds. Gastrointestinal: Soft.  Obese and pendulous.  He has some mild tenderness along the epigastrium and upper quadrants with no rigidity or guarding.  Bowel sounds present. Ext: No cyanosis or edema. Neuro: ANO x4.  No focal deficits. Musculoskeletal: No deformities or joint swelling    Data Reviewed: I have personally reviewed following labs and imaging studies  CBC: Recent Labs  Lab 09/29/20 0648 10/01/20 0437 10/03/20 0507  WBC 11.2* 9.6 8.4  HGB 12.4* 11.5* 12.3*  HCT 37.7* 34.9* 37.4*  MCV 111.5* 110.8* 110.3*  PLT 326 320 263   Basic Metabolic Panel: Recent Labs  Lab 09/29/20 0648 09/30/20 0559 10/01/20 0437 10/02/20 0437 10/03/20 0507  NA 136 135 136 136 135  K 4.0 3.5 3.6 3.7 4.0  CL 106 104 106 105 104  CO2 24 24 24 25 24   GLUCOSE 125* 112* 135* 140* 110*  BUN <5* <5* <5* <5* <5*  CREATININE 0.76 0.77 0.77 0.60* 0.58*  CALCIUM 7.6* 7.6* 7.9* 8.1* 8.4*  MG 2.0  --   --   --  1.7   GFR: Estimated Creatinine Clearance: 151 mL/min (A) (by C-G formula based on SCr of 0.58 mg/dL (L)). Liver Function Tests: No results for input(s): AST, ALT, ALKPHOS, BILITOT, PROT, ALBUMIN in the last 168 hours.  No results for input(s): LIPASE, AMYLASE in the last 168 hours.  No results for input(s):  AMMONIA in the last 168 hours. Coagulation Profile: No results for input(s): INR, PROTIME in the last 168 hours. Cardiac Enzymes: No results for input(s): CKTOTAL, CKMB, CKMBINDEX, TROPONINI in the last 168 hours. BNP (last 3 results) No results for input(s): PROBNP in the last 8760 hours. HbA1C: No results for input(s): HGBA1C in the last 72 hours. CBG: Recent Labs  Lab 10/04/20 1101 10/04/20 1616 10/04/20 2212 10/05/20 0550 10/05/20 1051  GLUCAP 116* 119* 139* 154* 119*   Lipid Profile: No results for input(s): CHOL, HDL, LDLCALC, TRIG, CHOLHDL, LDLDIRECT in the last 72 hours. Thyroid Function Tests: No results for input(s): TSH, T4TOTAL, FREET4, T3FREE, THYROIDAB in the last 72 hours. Anemia Panel: No results for input(s): VITAMINB12, FOLATE, FERRITIN, TIBC, IRON, RETICCTPCT in the last 72 hours.    Radiology Studies: I have reviewed all of the imaging during this hospital visit personally     Scheduled Meds:  apixaban  5 mg Oral BID  chlorhexidine  15 mL Mouth Rinse BID   insulin aspart  0-20 Units Subcutaneous Q6H   insulin glargine  6 Units Subcutaneous Q24H   iohexol       lipase/protease/amylase  12,000 Units Oral TID AC   nortriptyline  10 mg Oral Daily   nortriptyline  20 mg Oral QHS   pantoprazole  40 mg Oral Daily   Continuous Infusions:   LOS: 17 days   Total time spent: 30 minutes     Dorcas Carrow, MD

## 2020-10-06 LAB — COMPREHENSIVE METABOLIC PANEL
ALT: 16 U/L (ref 0–44)
AST: 23 U/L (ref 15–41)
Albumin: 2.7 g/dL — ABNORMAL LOW (ref 3.5–5.0)
Alkaline Phosphatase: 82 U/L (ref 38–126)
Anion gap: 8 (ref 5–15)
BUN: <5 mg/dL — ABNORMAL LOW (ref 6–20)
CO2: 27 mmol/L (ref 22–32)
Calcium: 8.9 mg/dL (ref 8.9–10.3)
Chloride: 102 mmol/L (ref 98–111)
Creatinine, Ser: 0.74 mg/dL (ref 0.61–1.24)
GFR, Estimated: >60 mL/min (ref >60)
Glucose, Bld: 116 mg/dL — ABNORMAL HIGH (ref 70–99)
Potassium: 4 mmol/L (ref 3.5–5.1)
Sodium: 137 mmol/L (ref 135–145)
Total Bilirubin: 0.7 mg/dL (ref 0.3–1.2)
Total Protein: 6.8 g/dL (ref 6.5–8.1)

## 2020-10-06 LAB — CBC WITH DIFFERENTIAL/PLATELET
Abs Immature Granulocytes: 0.04 10*3/uL (ref 0.00–0.07)
Basophils Absolute: 0.1 10*3/uL (ref 0.0–0.1)
Basophils Relative: 1 %
Eosinophils Absolute: 0.2 10*3/uL (ref 0.0–0.5)
Eosinophils Relative: 2 %
HCT: 40.1 % (ref 39.0–52.0)
Hemoglobin: 13.2 g/dL (ref 13.0–17.0)
Immature Granulocytes: 1 %
Lymphocytes Relative: 24 %
Lymphs Abs: 2.1 10*3/uL (ref 0.7–4.0)
MCH: 35.1 pg — ABNORMAL HIGH (ref 26.0–34.0)
MCHC: 32.9 g/dL (ref 30.0–36.0)
MCV: 106.6 fL — ABNORMAL HIGH (ref 80.0–100.0)
Monocytes Absolute: 0.9 10*3/uL (ref 0.1–1.0)
Monocytes Relative: 10 %
Neutro Abs: 5.4 10*3/uL (ref 1.7–7.7)
Neutrophils Relative %: 62 %
Platelets: 232 10*3/uL (ref 150–400)
RBC: 3.76 MIL/uL — ABNORMAL LOW (ref 4.22–5.81)
RDW: 13.2 % (ref 11.5–15.5)
WBC: 8.6 10*3/uL (ref 4.0–10.5)
nRBC: 0 % (ref 0.0–0.2)

## 2020-10-06 LAB — GLUCOSE, CAPILLARY
Glucose-Capillary: 126 mg/dL — ABNORMAL HIGH (ref 70–99)
Glucose-Capillary: 133 mg/dL — ABNORMAL HIGH (ref 70–99)
Glucose-Capillary: 128 mg/dL — ABNORMAL HIGH (ref 70–99)

## 2020-10-06 LAB — PHOSPHORUS: Phosphorus: 5 mg/dL — ABNORMAL HIGH (ref 2.5–4.6)

## 2020-10-06 LAB — MAGNESIUM: Magnesium: 1.7 mg/dL (ref 1.7–2.4)

## 2020-10-06 MED ORDER — LORAZEPAM 1 MG PO TABS
1.0000 mg | ORAL_TABLET | Freq: Three times a day (TID) | ORAL | Status: DC | PRN
  Administered 2020-10-06 – 2020-10-08 (×7): 1 mg via ORAL
  Filled 2020-10-06 (×7): qty 1

## 2020-10-06 MED ORDER — DICYCLOMINE HCL 10 MG PO CAPS
10.0000 mg | ORAL_CAPSULE | Freq: Three times a day (TID) | ORAL | Status: DC
  Administered 2020-10-06 – 2020-10-09 (×12): 10 mg via ORAL
  Filled 2020-10-06 (×13): qty 1

## 2020-10-06 MED ORDER — MAGNESIUM SULFATE 2 GM/50ML IV SOLN
2.0000 g | Freq: Once | INTRAVENOUS | Status: AC
  Administered 2020-10-06: 2 g via INTRAVENOUS
  Filled 2020-10-06: qty 50

## 2020-10-06 NOTE — Progress Notes (Signed)
PROGRESS NOTE    Bryce Mendoza  XEN:[id number] DOB: 1960/11/15 DOA: 09/17/2020 PCP: Claiborne Rigg, NP    Brief Narrative:  Bryce Mendoza was admitted to the hospital with a working diagnosis of acute severe pancreatitis, complicated with diabetes ketoacidosis and ileus.   60 year old male past medical history for DVT/PE, dyslipidemia, severe polyneuropathy who presented with epigastric abdominal pain.  Reported 2 days of abdominal pain, epigastric in location, radiated to his back.  Worse with movement or ambulation.  Because of persistent symptoms he presented to [facility] ED then transferred to [facility].  On his initial physical examination blood pressure 172/96, heart rate 103, respirate 23, temperature 97.5, oxygen saturation 96%, he had dry mucous membranes, his lungs were clear to auscultation bilaterally, heart S1-S2, present rhythmic, abdomen was soft and nontender, no lower extremity edema.  Sodium 129, potassium 4.0, chloride 89, bicarb 15, glucose 422, BUN 14, creatinine 0.86, lipase 2959, white count 22.4, hemoglobin 14.9, hematocrit 41.4, platelets 171.   CT of the abdomen show acute pancreatitis, no organized fluid collections.  Small wall thickening of duodenum.  Hepatic steatosis.             Patient was placed on intravenous fluids and IV insulin for diabetes ketoacidosis.   Follow-up CT showed severe acute interstitial edematous pancreatitis likely transitioning into necrotic pancreatitis.  Nearly glandular and peripancreatic necrosis. Patient was placed on meropenem for antibiotic therapy.    Assessment & Plan:   Principal Problem:   Acute pancreatitis Active Problems:   Idiopathic polyneuropathy   DKA (diabetic ketoacidosis) (HCC)   Mixed hyperlipidemia   Lactic acidosis   Elevated BP without diagnosis of hypertension   Alcohol dependence with unspecified alcohol-induced disorder (HCC)   Hypomagnesemia   Hypophosphatemia   Constipation   Abdominal distension    Acidosis, metabolic   Acute necrotizing pancreatitis/complicated with ileus: Slowly improving.  Bowel functions improving.  Tolerating soft diet.  Still with significant pain. Is still needing IV opiates for pain relief. Patient with recurrent nausea and episodes of vomiting.  Bowel functions present.  Repeat CT scan on 8/5 with persistent necrotic pancreas, new 4 x 4 centimeter liquid collection near the head of the pancreas.  Lipase and electrolytes stabilized. Encourage oral pain medications.  Continue to mobilize. On supplements with pancreatic enzymes.    New onset  adult onset T2DM with DKA,  Untreated diabetes.  A1c 9.4.  Currently on long-acting insulin and prandial insulin.  Patient will continue to learn to use insulin for preparation of discharge home. Is learning insulin injections technique.  Hyperlipidemia: On statin therapy continue.  Alcohol abuse: Out of withdrawal window.  On multivitamins.  History of DVT and PE: Therapeutic on Eliquis.  Hypomagnesemia: Replaced.  Further replace today.  Depression: On nortriptyline that he will continue to.   Status is: Inpatient  Remains inpatient appropriate because:Inpatient level of care appropriate due to severity of illness.  Patient is still with significant pain.  Dispo: The patient is from: Home              Anticipated d/c is to: Home              Patient currently is not medically stable to d/c.  Continues to need IV pain medications and also intolerance to oral intake.   Difficult to place patient No   DVT prophylaxis: Eliquis Code Status:   DNR   Family Communication:  No family at the bedside     Consultants:  GI  Antimicrobials:  Completed meropenem.     Subjective: Patient seen and examined.  Nausea is better.  Still has intermittent severe epigastric pain.  Objective: Vitals:   10/05/20 0552 10/05/20 1346 10/05/20 2038 10/06/20 0555  BP: 124/82 118/76 129/72 (!) 129/94  Pulse: 83 83 83 85   Resp: 20 18 20 20   Temp: 98 F (36.7 C) 98.8 F (37.1 C) 98.3 F (36.8 C) 97.9 F (36.6 C)  TempSrc: Oral Oral Oral Oral  SpO2: 98% 99% 100% 100%  Weight:      Height:        Intake/Output Summary (Last 24 hours) at 10/06/2020 1234 Last data filed at 10/06/2020 12/06/2020 Gross per 24 hour  Intake --  Output 850 ml  Net -850 ml   Filed Weights   09/24/20 2026 09/24/20 2041 09/26/20 1805  Weight: (!) 141.4 kg (!) 141.4 kg (!) 145.5 kg    Examination:   General: Looks comfortable at rest.  Anxious on epigastric exam.  Not in any distress. Cardiovascular: S1-S2 normal.  No added sounds. Respiratory: Bilateral clear.  No added sound. Gastrointestinal: Soft.  Pendulous.  Mild tenderness epigastrium and right upper quadrant with no rigidity or guarding. Ext: No cyanosis or edema. Neuro: Alert oriented x4.  No focal deficits.   Data Reviewed: I have personally reviewed following labs and imaging studies  CBC: Recent Labs  Lab 10/01/20 0437 10/03/20 0507 10/06/20 0551  WBC 9.6 8.4 8.6  NEUTROABS  --   --  5.4  HGB 11.5* 12.3* 13.2  HCT 34.9* 37.4* 40.1  MCV 110.8* 110.3* 106.6*  PLT 320 263 232   Basic Metabolic Panel: Recent Labs  Lab 09/30/20 0559 10/01/20 0437 10/02/20 0437 10/03/20 0507 10/06/20 0551  NA 135 136 136 135 137  K 3.5 3.6 3.7 4.0 4.0  CL 104 106 105 104 102  CO2 24 24 25 24 27   GLUCOSE 112* 135* 140* 110* 116*  BUN <5* <5* <5* <5* <5*  CREATININE 0.77 0.77 0.60* 0.58* 0.74  CALCIUM 7.6* 7.9* 8.1* 8.4* 8.9  MG  --   --   --  1.7 1.7  PHOS  --   --   --   --  5.0*   GFR: Estimated Creatinine Clearance: 151 mL/min (by C-G formula based on SCr of 0.74 mg/dL). Liver Function Tests: Recent Labs  Lab 10/06/20 0551  AST 23  ALT 16  ALKPHOS 82  BILITOT 0.7  PROT 6.8  ALBUMIN 2.7*    No results for input(s): LIPASE, AMYLASE in the last 168 hours.  No results for input(s): AMMONIA in the last 168 hours. Coagulation Profile: No results for  input(s): INR, PROTIME in the last 168 hours. Cardiac Enzymes: No results for input(s): CKTOTAL, CKMB, CKMBINDEX, TROPONINI in the last 168 hours. BNP (last 3 results) No results for input(s): PROBNP in the last 8760 hours. HbA1C: No results for input(s): HGBA1C in the last 72 hours. CBG: Recent Labs  Lab 10/05/20 1051 10/05/20 1652 10/05/20 2350 10/06/20 0559 10/06/20 1150  GLUCAP 119* 136* 164* 126* 133*   Lipid Profile: No results for input(s): CHOL, HDL, LDLCALC, TRIG, CHOLHDL, LDLDIRECT in the last 72 hours. Thyroid Function Tests: No results for input(s): TSH, T4TOTAL, FREET4, T3FREE, THYROIDAB in the last 72 hours. Anemia Panel: No results for input(s): VITAMINB12, FOLATE, FERRITIN, TIBC, IRON, RETICCTPCT in the last 72 hours.    Radiology Studies: I have reviewed all of the imaging during this hospital visit personally  Scheduled Meds:  apixaban  5 mg Oral BID   chlorhexidine  15 mL Mouth Rinse BID   insulin aspart  0-20 Units Subcutaneous Q6H   insulin glargine  6 Units Subcutaneous Q24H   lipase/protease/amylase  12,000 Units Oral TID AC   nortriptyline  10 mg Oral Daily   nortriptyline  20 mg Oral QHS   pantoprazole  40 mg Oral Daily   Continuous Infusions:   LOS: 18 days   Total time spent: 30 minutes     Dorcas Carrow, MD

## 2020-10-07 LAB — GLUCOSE, CAPILLARY
Glucose-Capillary: 127 mg/dL — ABNORMAL HIGH (ref 70–99)
Glucose-Capillary: 128 mg/dL — ABNORMAL HIGH (ref 70–99)
Glucose-Capillary: 174 mg/dL — ABNORMAL HIGH (ref 70–99)
Glucose-Capillary: 180 mg/dL — ABNORMAL HIGH (ref 70–99)
Glucose-Capillary: 220 mg/dL — ABNORMAL HIGH (ref 70–99)

## 2020-10-07 NOTE — Progress Notes (Signed)
PROGRESS NOTE    Bryce Mendoza  XBM:[id number] DOB: Nov 09, 1960 DOA: 09/17/2020 PCP: Claiborne Rigg, NP    Brief Narrative:  Mr. Murrillo was admitted to the hospital with a working diagnosis of acute severe pancreatitis, complicated with diabetes ketoacidosis and ileus.  60 year old male past medical history for DVT/PE, dyslipidemia, severe polyneuropathy who presented with epigastric abdominal pain.  Reported 2 days of abdominal pain, epigastric in location, radiated to his back.  Worse with movement or ambulation.  Because of persistent symptoms he presented to [facility] ED then transferred to [facility].  On his initial physical examination blood pressure 172/96, heart rate 103, respirate 23, temperature 97.5, oxygen saturation 96%, he had dry mucous membranes, his lungs were clear to auscultation bilaterally, heart S1-S2, present rhythmic, abdomen was soft and nontender, no lower extremity edema.  Sodium 129, potassium 4.0, chloride 89, bicarb 15, glucose 422, BUN 14, creatinine 0.86, lipase 2959, white count 22.4, hemoglobin 14.9, hematocrit 41.4, platelets 171.   CT of the abdomen show acute pancreatitis, no organized fluid collections.  Small wall thickening of duodenum.  Hepatic steatosis. Patient was placed on intravenous fluids and IV insulin for diabetes ketoacidosis.   Follow-up CT showed severe acute interstitial edematous pancreatitis likely transitioning into necrotic pancreatitis.  Nearly glandular and peripancreatic necrosis. Patient was placed on meropenem for antibiotic therapy.    Assessment & Plan:   Principal Problem:   Acute pancreatitis Active Problems:   Idiopathic polyneuropathy   DKA (diabetic ketoacidosis) (HCC)   Mixed hyperlipidemia   Lactic acidosis   Elevated BP without diagnosis of hypertension   Alcohol dependence with unspecified alcohol-induced disorder (HCC)   Hypomagnesemia   Hypophosphatemia   Constipation   Abdominal distension   Acidosis,  metabolic   Acute necrotizing pancreatitis/complicated with ileus: Some clinical improvement today.  Bowel function is normal.  Tolerating soft diet.  Trying to taper off IV pain medications. Repeat CT scan 8/5 with persistent necrotic pancreas, 4 x 4 centimeter liquid collection near the head of pancreas. Encourage oral pain medications.  Continue to mobilize. On supplements with pancreatic enzymes.    New onset  adult onset T2DM with DKA: Untreated diabetes.  A1c 9.4.  Currently on long-acting insulin and prandial insulin.  Patient will continue to learn to use insulin for preparation of discharge home. Is learning insulin injections technique.  Hyperlipidemia: On statin therapy continue.  Alcohol abuse: On multivitamins.  He will not drink any alcohol after discharge.  History of DVT and PE: Therapeutic on Eliquis.  Hypomagnesemia: Replaced.  Further replace today.  Depression: On nortriptyline that he will continue to.   Status is: Inpatient  Remains inpatient appropriate because:Inpatient level of care appropriate due to severity of illness.  Patient is still with significant pain.  Dispo: The patient is from: Home              Anticipated d/c is to: Home              Patient currently is not medically stable to d/c.  Continues to need IV pain medications and also intolerance to oral intake.   Difficult to place patient No   DVT prophylaxis: Eliquis Code Status:   DNR   Family Communication:  No family at the bedside     Consultants:  GI   Antimicrobials:  Completed meropenem.     Subjective: Patient seen and examined.  She is the first night he went without using IV pain medications.  He was happy about it.  Denies any nausea vomiting.  He had 1 bowel movement overnight which was thicker liquid.  Objective: Vitals:   10/05/20 2038 10/06/20 0555 10/06/20 1448 10/06/20 2011  BP: 129/72 (!) 129/94 136/80 101/72  Pulse: 83 85 75 86  Resp: 20 20 20 16   Temp:  98.3 F (36.8 C) 97.9 F (36.6 C) 98.2 F (36.8 C) 98.6 F (37 C)  TempSrc: Oral Oral  Oral  SpO2: 100% 100% 100% 93%  Weight:      Height:        Intake/Output Summary (Last 24 hours) at 10/07/2020 1301 Last data filed at 10/07/2020 0900 Gross per 24 hour  Intake 480 ml  Output 1200 ml  Net -720 ml   Filed Weights   09/24/20 2026 09/24/20 2041 09/26/20 1805  Weight: (!) 141.4 kg (!) 141.4 kg (!) 145.5 kg    Examination:   General: Looks comfortable at rest.  On room air. Cardiovascular: S1-S2 normal.  No added sounds. Respiratory: Bilateral clear.  No added sounds. Gastrointestinal: Soft nontender.  Bowel sounds present.  Mild tenderness around the epigastric area. Ext: No swelling or edema.  No cyanosis. Neuro: Alert oriented x4.  No focal deficits.  He has chronic neuropathy bilateral legs.   Data Reviewed: I have personally reviewed following labs and imaging studies  CBC: Recent Labs  Lab 10/01/20 0437 10/03/20 0507 10/06/20 0551  WBC 9.6 8.4 8.6  NEUTROABS  --   --  5.4  HGB 11.5* 12.3* 13.2  HCT 34.9* 37.4* 40.1  MCV 110.8* 110.3* 106.6*  PLT 320 263 232   Basic Metabolic Panel: Recent Labs  Lab 10/01/20 0437 10/02/20 0437 10/03/20 0507 10/06/20 0551  NA 136 136 135 137  K 3.6 3.7 4.0 4.0  CL 106 105 104 102  CO2 24 25 24 27   GLUCOSE 135* 140* 110* 116*  BUN <5* <5* <5* <5*  CREATININE 0.77 0.60* 0.58* 0.74  CALCIUM 7.9* 8.1* 8.4* 8.9  MG  --   --  1.7 1.7  PHOS  --   --   --  5.0*   GFR: Estimated Creatinine Clearance: 151 mL/min (by C-G formula based on SCr of 0.74 mg/dL). Liver Function Tests: Recent Labs  Lab 10/06/20 0551  AST 23  ALT 16  ALKPHOS 82  BILITOT 0.7  PROT 6.8  ALBUMIN 2.7*    No results for input(s): LIPASE, AMYLASE in the last 168 hours.  No results for input(s): AMMONIA in the last 168 hours. Coagulation Profile: No results for input(s): INR, PROTIME in the last 168 hours. Cardiac Enzymes: No results for  input(s): CKTOTAL, CKMB, CKMBINDEX, TROPONINI in the last 168 hours. BNP (last 3 results) No results for input(s): PROBNP in the last 8760 hours. HbA1C: No results for input(s): HGBA1C in the last 72 hours. CBG: Recent Labs  Lab 10/06/20 1150 10/06/20 1617 10/06/20 2359 10/07/20 0445 10/07/20 1152  GLUCAP 133* 128* 180* 127* 174*   Lipid Profile: No results for input(s): CHOL, HDL, LDLCALC, TRIG, CHOLHDL, LDLDIRECT in the last 72 hours. Thyroid Function Tests: No results for input(s): TSH, T4TOTAL, FREET4, T3FREE, THYROIDAB in the last 72 hours. Anemia Panel: No results for input(s): VITAMINB12, FOLATE, FERRITIN, TIBC, IRON, RETICCTPCT in the last 72 hours.    Radiology Studies: I have reviewed all of the imaging during this hospital visit personally     Scheduled Meds:  apixaban  5 mg Oral BID   chlorhexidine  15 mL Mouth Rinse BID   dicyclomine  10  mg Oral TID AC & HS   insulin aspart  0-20 Units Subcutaneous Q6H   insulin glargine  6 Units Subcutaneous Q24H   lipase/protease/amylase  12,000 Units Oral TID AC   nortriptyline  10 mg Oral Daily   nortriptyline  20 mg Oral QHS   pantoprazole  40 mg Oral Daily   Continuous Infusions:   LOS: 19 days   Total time spent: 30 minutes     Dorcas Carrow, MD

## 2020-10-08 LAB — GLUCOSE, CAPILLARY
Glucose-Capillary: 158 mg/dL — ABNORMAL HIGH (ref 70–99)
Glucose-Capillary: 154 mg/dL — ABNORMAL HIGH (ref 70–99)
Glucose-Capillary: 110 mg/dL — ABNORMAL HIGH (ref 70–99)
Glucose-Capillary: 174 mg/dL — ABNORMAL HIGH (ref 70–99)

## 2020-10-08 MED ORDER — ZINC OXIDE 40 % EX OINT
TOPICAL_OINTMENT | Freq: Four times a day (QID) | CUTANEOUS | Status: DC | PRN
  Filled 2020-10-08: qty 57

## 2020-10-08 NOTE — Plan of Care (Signed)
  Problem: Education: Goal: Knowledge of General Education information will improve Description: Including pain rating scale, medication(s)/side effects and non-pharmacologic comfort measures Outcome: Progressing   Problem: Clinical Measurements: Goal: Ability to maintain clinical measurements within normal limits will improve Outcome: Progressing Goal: Will remain free from infection Outcome: Progressing   Problem: Nutrition: Goal: Adequate nutrition will be maintained Outcome: Progressing   Problem: Elimination: Goal: Will not experience complications related to bowel motility Outcome: Progressing   Problem: Pain Managment: Goal: General experience of comfort will improve Outcome: Progressing   Problem: Skin Integrity: Goal: Risk for impaired skin integrity will decrease Outcome: Progressing   Problem: Education: Goal: Ability to describe self-care measures that may prevent or decrease complications (Diabetes Survival Skills Education) will improve Outcome: Progressing Goal: Individualized Educational Video(s) Outcome: Progressing   Problem: Health Behavior/Discharge Planning: Goal: Ability to identify and utilize available resources and services will improve Outcome: Progressing Goal: Ability to manage health-related needs will improve Outcome: Progressing   Problem: Fluid Volume: Goal: Ability to achieve a balanced intake and output will improve Outcome: Progressing   Problem: Metabolic: Goal: Ability to maintain appropriate glucose levels will improve Outcome: Progressing   Problem: Nutritional: Goal: Maintenance of adequate nutrition will improve Outcome: Progressing Goal: Maintenance of adequate weight for body size and type will improve Outcome: Progressing

## 2020-10-08 NOTE — Progress Notes (Signed)
PROGRESS NOTE    Bryce Mendoza  GQQ:[id number] DOB: 06/06/1960 DOA: 09/17/2020 PCP: Claiborne Rigg, NP    Brief Narrative:  Bryce Mendoza was admitted to the hospital with a working diagnosis of acute severe pancreatitis, complicated with diabetes ketoacidosis and ileus.  Bryce Mendoza past medical history for DVT/PE, dyslipidemia, severe polyneuropathy who presented with epigastric abdominal pain.  Reported 2 days of abdominal pain, epigastric in location, radiated to his back.  Worse with movement or ambulation.  Because of persistent symptoms he presented to [facility] ED then transferred to [facility].  On his initial physical examination blood pressure 172/96, heart rate 103, respirate 23, temperature 97.5, oxygen saturation 96%, he had dry mucous membranes, his lungs were clear to auscultation bilaterally, heart S1-S2, present rhythmic, abdomen was soft and nontender, no lower extremity edema.  Sodium 129, potassium 4.0, chloride 89, bicarb 15, glucose 422, BUN 14, creatinine 0.86, lipase 2959, white count 22.4, hemoglobin 14.9, hematocrit 41.4, platelets 171.   CT of the abdomen show acute pancreatitis, no organized fluid collections.  Small wall thickening of duodenum.  Hepatic steatosis. Patient was placed on intravenous fluids and IV insulin for diabetes ketoacidosis.   Follow-up CT showed severe acute interstitial edematous pancreatitis likely transitioning into necrotic pancreatitis.  Nearly glandular and peripancreatic necrosis. Patient was placed on meropenem for antibiotic therapy.  Remains in the hospital with ongoing pain issues, now developing diarrhea due to pancreatic insufficiency.    Assessment & Plan:   Principal Problem:   Acute pancreatitis Active Problems:   Idiopathic polyneuropathy   DKA (diabetic ketoacidosis) (HCC)   Mixed hyperlipidemia   Lactic acidosis   Elevated BP without diagnosis of hypertension   Alcohol dependence with unspecified alcohol-induced  disorder (HCC)   Hypomagnesemia   Hypophosphatemia   Constipation   Abdominal distension   Acidosis, metabolic   Acute necrotizing pancreatitis/complicated with ileus: Some clinical improvement today.  Bowel function is normal.  Tolerating soft diet.  Trying to taper off IV pain medications. Repeat CT scan 8/5 with persistent necrotic pancreas, 4 x 4 centimeter liquid collection near the head of pancreas. Using oral pain medications.  Continue to mobilize. On supplements with pancreatic enzymes.   Does have insufficiency diarrhea, however will refrain from using Imodium because he just developed ileus.  New onset  adult onset T2DM with DKA: Untreated diabetes.  A1c 9.4.  Currently on long-acting insulin and prandial insulin.  Patient will continue to learn to use insulin for preparation of discharge home. Is learning insulin injections technique.  Hyperlipidemia: On statin therapy continue.  Alcohol abuse: On multivitamins.  He will not drink any alcohol after discharge.  History of DVT and PE: Therapeutic on Eliquis.  Hypomagnesemia: Replaced.  Adequate.  Depression: On nortriptyline that he will continue to.   Status is: Inpatient  Remains inpatient appropriate because:Inpatient level of care appropriate due to severity of illness.  Patient is still with significant diarrhea.  We anticipate he will able to go home next 24 to 48 hours.  Dispo: The patient is from: Home              Anticipated d/c is to: Home              Patient currently is not medically stable to d/c.     Difficult to place patient No   DVT prophylaxis: Eliquis Code Status:   DNR   Family Communication:  No family at the bedside     Consultants:  GI  Antimicrobials:  Completed meropenem.     Subjective: Seen and examined.  He is trying not to use any IV pain medications and has not used since last 48 hours. 2 episodes of large liquidy stool overnight.  Patient thinks they are getting more  thicker. Very nervous about going home because he lives alone. Objective: Vitals:   10/07/20 1327 10/07/20 2104 10/08/20 0554 10/08/20 1228  BP: 107/76 119/76 123/80 122/80  Pulse: 96 90 80 83  Resp: 18 17  16   Temp: (!) 97.1 F (36.2 C) 98.5 F (36.9 C) 98 F (36.7 C) 98.4 F (36.9 C)  TempSrc:  Oral Oral Oral  SpO2: 90% 93% 96% 97%  Weight:      Height:        Intake/Output Summary (Last 24 hours) at 10/08/2020 1318 Last data filed at 10/08/2020 1230 Gross per 24 hour  Intake 1260 ml  Output 1250 ml  Net 10 ml   Filed Weights   09/24/20 2026 09/24/20 2041 09/26/20 1805  Weight: (!) 141.4 kg (!) 141.4 kg (!) 145.5 kg    Examination:   General: Looks comfortable at rest.  On room air. Cardiovascular: S1-S2 normal.  No added sounds. Respiratory: Bilateral clear.  No added sounds. Gastrointestinal: Soft nontender.  Bowel sounds present.  Mild tenderness around the epigastric area. Ext: No swelling or edema.  No cyanosis. Neuro: Alert oriented x4.  No focal deficits.  He has chronic neuropathy bilateral legs.   Data Reviewed: I have personally reviewed following labs and imaging studies  CBC: Recent Labs  Lab 10/03/20 0507 10/06/20 0551  WBC 8.4 8.6  NEUTROABS  --  5.4  HGB 12.3* 13.2  HCT 37.4* 40.1  MCV 110.3* 106.6*  PLT 263 232   Basic Metabolic Panel: Recent Labs  Lab 10/02/20 0437 10/03/20 0507 10/06/20 0551  NA 136 135 137  K 3.7 4.0 4.0  CL 105 104 102  CO2 25 24 27   GLUCOSE 140* 110* 116*  BUN <5* <5* <5*  CREATININE 0.60* 0.Bryce* 0.74  CALCIUM 8.1* 8.4* 8.9  MG  --  1.7 1.7  PHOS  --   --  5.0*   GFR: Estimated Creatinine Clearance: 151 mL/min (by C-G formula based on SCr of 0.74 mg/dL). Liver Function Tests: Recent Labs  Lab 10/06/20 0551  AST 23  ALT 16  ALKPHOS 82  BILITOT 0.7  PROT 6.8  ALBUMIN 2.7*    No results for input(s): LIPASE, AMYLASE in the last 168 hours.  No results for input(s): AMMONIA in the last 168  hours. Coagulation Profile: No results for input(s): INR, PROTIME in the last 168 hours. Cardiac Enzymes: No results for input(s): CKTOTAL, CKMB, CKMBINDEX, TROPONINI in the last 168 hours. BNP (last 3 results) No results for input(s): PROBNP in the last 8760 hours. HbA1C: No results for input(s): HGBA1C in the last 72 hours. CBG: Recent Labs  Lab 10/07/20 1152 10/07/20 1806 10/07/20 2359 10/08/20 0555 10/08/20 1055  GLUCAP 174* 220* 128* 158* 154*   Lipid Profile: No results for input(s): CHOL, HDL, LDLCALC, TRIG, CHOLHDL, LDLDIRECT in the last 72 hours. Thyroid Function Tests: No results for input(s): TSH, T4TOTAL, FREET4, T3FREE, THYROIDAB in the last 72 hours. Anemia Panel: No results for input(s): VITAMINB12, FOLATE, FERRITIN, TIBC, IRON, RETICCTPCT in the last 72 hours.    Radiology Studies: I have reviewed all of the imaging during this hospital visit personally     Scheduled Meds:  apixaban  5 mg Oral BID  chlorhexidine  15 mL Mouth Rinse BID   dicyclomine  10 mg Oral TID AC & HS   insulin aspart  0-20 Units Subcutaneous Q6H   insulin glargine  6 Units Subcutaneous Q24H   lipase/protease/amylase  12,000 Units Oral TID AC   nortriptyline  10 mg Oral Daily   nortriptyline  20 mg Oral QHS   pantoprazole  40 mg Oral Daily   Continuous Infusions:   LOS: 20 days   Total time spent: 30 minutes     Dorcas Carrow, MD

## 2020-10-09 LAB — GLUCOSE, CAPILLARY
Glucose-Capillary: 134 mg/dL — ABNORMAL HIGH (ref 70–99)
Glucose-Capillary: 169 mg/dL — ABNORMAL HIGH (ref 70–99)

## 2020-10-09 MED ORDER — OXYCODONE HCL 10 MG PO TABS: 10.0000 mg | ORAL_TABLET | ORAL | 0 refills | Status: DC | PRN

## 2020-10-09 MED ORDER — ZOLPIDEM TARTRATE 5 MG PO TABS: 5.0000 mg | ORAL_TABLET | Freq: Every evening | ORAL | 0 refills | Status: DC | PRN

## 2020-10-09 MED ORDER — LORAZEPAM 1 MG PO TABS: 1.0000 mg | ORAL_TABLET | Freq: Two times a day (BID) | ORAL | 0 refills | Status: DC | PRN

## 2020-10-09 MED ORDER — METFORMIN HCL ER 500 MG PO TB24: 500.0000 mg | ORAL_TABLET | Freq: Two times a day (BID) | ORAL | 0 refills | Status: DC

## 2020-10-09 MED ORDER — DICYCLOMINE HCL 10 MG PO CAPS: 10.0000 mg | ORAL_CAPSULE | Freq: Three times a day (TID) | ORAL | 0 refills | Status: DC

## 2020-10-09 MED ORDER — GLIPIZIDE 5 MG PO TABS: 5.0000 mg | ORAL_TABLET | Freq: Every day | ORAL | 0 refills | Status: DC

## 2020-10-09 MED ORDER — PANCRELIPASE (LIP-PROT-AMYL) 12000-38000 UNITS PO CPEP: 12000.0000 [IU] | ORAL_CAPSULE | Freq: Three times a day (TID) | ORAL | 0 refills | Status: AC

## 2020-10-09 MED ORDER — BLOOD GLUCOSE MONITOR KIT: PACK | 0 refills | Status: DC

## 2020-10-09 MED ORDER — ACETAMINOPHEN 500 MG PO TABS: 500.0000 mg | ORAL_TABLET | Freq: Four times a day (QID) | ORAL | 0 refills | Status: DC | PRN

## 2020-10-09 MED ORDER — PANTOPRAZOLE SODIUM 40 MG PO TBEC: 40.0000 mg | DELAYED_RELEASE_TABLET | Freq: Every day | ORAL | 0 refills | Status: DC

## 2020-10-09 NOTE — Discharge Summary (Signed)
Physician Discharge Summary  Bryce Mendoza BLT:[id number] DOB: 15-Aug-1960 DOA: 09/17/2020  PCP: [name], NP  Admit date: 09/17/2020 Discharge date: 10/09/2020  Admitted From: Home Disposition: Home  Recommendations for Outpatient Follow-up:  Follow up with PCP in 1-2 weeks   Home Health: Not applicable Equipment/Devices: Not applicable  Discharge Condition: Stable CODE STATUS: Full code Diet recommendation: Low-carb diet.  Soft diet and easily digestible fluid.  Discharge summary: Mr. Sondgeroth was admitted to the hospital with a working diagnosis of acute severe pancreatitis, complicated with diabetes ketoacidosis and ileus.  60 year old male past medical history for DVT/PE, dyslipidemia, severe polyneuropathy who presented with epigastric abdominal pain.  Reported 2 days of abdominal pain, epigastric in location, radiated to his back.  Worse with movement or ambulation.  Because of persistent symptoms he presented to [facility] ED then transferred to [facility].  On his initial physical examination blood pressure 172/96, heart rate 103, respirate 23, temperature 97.5, oxygen saturation 96%, he had dry mucous membranes, his lungs were clear to auscultation bilaterally, heart S1-S2, present rhythmic, abdomen was soft and nontender, no lower extremity edema.  Sodium 129, potassium 4.0, chloride 89, bicarb 15, glucose 422, BUN 14, creatinine 0.86, lipase 2959, white count 22.4, hemoglobin 14.9, hematocrit 41.4, platelets 171. CT of the abdomen show acute pancreatitis, no organized fluid collections.  Small wall thickening of duodenum.  Hepatic steatosis. Patient was placed on intravenous fluids and IV insulin for diabetes ketoacidosis.    Patient remained in the hospital for a long time because of complications from acute pancreatitis.  He developed necrotizing pancreatitis, prolonged ileus, difficult to tolerate diet and severe pain due to pancreatitis.  He is stabilizing now, pain managed  with oral pain medications so he is able to go home with follow-up plans.  In the hospital he was seen and followed by gastroenterology and surgery.     # Acute necrotizing pancreatitis/complicated with ileus: clinical improvement today.  Bowel function is normal.  Tolerating soft diet.  Pain managed with oral pain medications, however is still using frequent oxycodone by mouth.  Which has been supplemented with Bentyl/Tylenol.  Had persistent diarrhea that has improved now.  He is on pancreatic enzymes.  -Discharged home with oral pain Medications, 5 days course of oxycodone 10 mg every 4 hours prescribed.  He will gradually taper it off and stop within the next 5 to 7 days.  Detail instructions given about use of narcotics.  -Patient did very well from alcohol cessation, still has some anxiety.  Pain aggravates his anxiety.  We will give a short course of anxiety medicine with Ativan 1 mg twice a day for 5 days.  He can also use Ambien at night to help him sleep so he does not need to use pain medications.  Complete abstinence from alcohol.  # New onset  adult onset T2DM with DKA: Untreated diabetes.  A1c 9.4.   His acidosis was probably due to combination of diabetes as well as acute necrotizing pancreatitis.  Patient is currently on 6 units of long-acting insulin a day and blood sugars are already fairly stable.   Patient is reluctant to go on injectable regimen at home.  Given his blood sugars are also well controlled on minimal doses of insulin, he can probably do well with oral hypoglycemics.   Will discharge him on glipizide 5 mg daily, metformin 500 mg twice a day.   He will keep a logbook of blood sugars at home and bring it to doctors  office for follow-up visits so that his medications can be titrated.   Hypoglycemic symptoms.  Use of medications discussed and aware.   Seen by diabetic educator and dietitian.    Hyperlipidemia: On statin therapy continue.   History of DVT and PE:  Therapeutic on Eliquis.  Patient had a spontaneous PE and DVT in 2019, he will stay on lifelong anticoagulation as most possible.   Depression: On nortriptyline that he will continue to.  Stable to discharge home.  Prescription medications including tapering off narcotics and precautions discussed in details.  Possible side effects including dependence discussed and patient is going to taper off using medications as soon as possible.    Discharge Diagnoses:  Principal Problem:   Acute pancreatitis Active Problems:   Idiopathic polyneuropathy   DKA (diabetic ketoacidosis) (HCC)   Mixed hyperlipidemia   Lactic acidosis   Elevated BP without diagnosis of hypertension   Alcohol dependence with unspecified alcohol-induced disorder (HCC)   Hypomagnesemia   Hypophosphatemia   Constipation   Abdominal distension   Acidosis, metabolic    Discharge Instructions  Discharge Instructions     Call MD for:  difficulty breathing, headache or visual disturbances   Complete by: As directed    Call MD for:  persistant nausea and vomiting   Complete by: As directed    Call MD for:  severe uncontrolled pain   Complete by: As directed    Diet Carb Modified   Complete by: As directed    Discharge instructions   Complete by: As directed    Stay on easily digestible soft food and liquids. Take oral pain medications and gradually taper off next 5 to 7 days. Do not drive or operate heavy machinery's while taking narcotics along with anxiety medications. Schedule follow-up with your primary care physician in 1 to 2 weeks for reevaluation. Check your blood sugars at home at least twice a day and keep a logbook to bring it back to the doctor's office for follow-up.   Increase activity slowly   Complete by: As directed       Allergies as of 10/09/2020   No Known Allergies      Medication List     TAKE these medications    acetaminophen 500 MG tablet Commonly known as: TYLENOL Take 1  tablet (500 mg total) by mouth every 6 (six) hours as needed for mild pain or fever. What changed:  when to take this reasons to take this   atorvastatin 40 MG tablet Commonly known as: LIPITOR Take 1 tablet (40 mg total) by mouth daily.   blood glucose meter kit and supplies Kit Dispense based on patient and insurance preference. Use up to four times daily as directed.   dicyclomine 10 MG capsule Commonly known as: BENTYL Take 1 capsule (10 mg total) by mouth 4 (four) times daily -  before meals and at bedtime for 7 doses.   Eliquis 5 MG Tabs tablet Generic drug: apixaban Take 1 tablet (5 mg total) by mouth 2 (two) times daily.   glipiZIDE 5 MG tablet Commonly known as: GLUCOTROL Take 1 tablet (5 mg total) by mouth daily before breakfast.   lipase/protease/amylase 12000-38000 units Cpep capsule Commonly known as: CREON Take 1 capsule (12,000 Units total) by mouth 3 (three) times daily before meals.   LORazepam 1 MG tablet Commonly known as: ATIVAN Take 1 tablet (1 mg total) by mouth every 12 (twelve) hours as needed for up to 5 days for anxiety.  metFORMIN 500 MG 24 hr tablet Commonly known as: Glucophage XR Take 1 tablet (500 mg total) by mouth 2 (two) times daily.   nortriptyline 10 MG capsule Commonly known as: PAMELOR TAKE 1 CAPSULE BY MOUTH DAILY and  TAKE 2 capsules by mouth AT BEDTIME FOR NEUROPATHY What changed:  how much to take how to take this when to take this additional instructions   Oxycodone HCl 10 MG Tabs Take 1 tablet (10 mg total) by mouth every 4 (four) hours as needed for up to 5 days for breakthrough pain or moderate pain.   pantoprazole 40 MG tablet Commonly known as: PROTONIX Take 1 tablet (40 mg total) by mouth daily.   zolpidem 5 MG tablet Commonly known as: AMBIEN Take 1 tablet (5 mg total) by mouth at bedtime as needed for up to 5 days for sleep.        Follow-up Information     [name], NP Follow up in 1 week(s).    Specialty: Nurse Practitioner Contact information: [address] [place] [address] [phone]                No Known Allergies  Consultations: General surgery Gastroenterology   Procedures/Studies: DG Abd 1 View  Result Date: 09/28/2020 CLINICAL DATA:  Abdominal pain EXAM: ABDOMEN - 1 VIEW COMPARISON:  09/25/2020 FINDINGS: Diffusely gas-filled and distended small bowel and colon throughout, not not significantly changed. Largest loops of transverse colon measure up to 10.6 cm in caliber. No free air in the abdomen on supine radiographs. IMPRESSION: Diffusely gas-filled and distended small bowel and colon throughout, not significantly changed. Largest loops of transverse colon measure up to 10.6 cm in caliber. Findings remain most consistent with ileus. No free air in the abdomen on supine radiographs. Electronically Signed   By: Eddie Candle M.D.   On: 09/28/2020 11:18   CT ABDOMEN PELVIS W CONTRAST  Result Date: 10/05/2020 CLINICAL DATA:  Pancreatitis, persistent abdominal pain and abdominal distension EXAM: CT ABDOMEN AND PELVIS WITH CONTRAST TECHNIQUE: Multidetector CT imaging of the abdomen and pelvis was performed using the standard protocol following bolus administration of intravenous contrast. CONTRAST:  40mL OMNIPAQUE IOHEXOL 350 MG/ML SOLN COMPARISON:  09/25/2020 FINDINGS: Lower chest: No acute abnormality. Trace left pleural effusion associated atelectasis or consolidation, unchanged. Coronary artery calcifications. Hepatobiliary: No solid liver abnormality is seen. Hepatic steatosis. No gallstones, gallbladder wall thickening, or biliary dilatation. Pancreas: Redemonstrated diffuse inflammatory fat stranding and fluid about the pancreas. There is a new fluid collection anterior to the pancreatic head and uncinate measuring 4.4 x 3.3 cm (series 2, image 41). There is redemonstrated hypoenhancement of the distal pancreatic body and tail (series 2, image 33) with  surrounding fluid. Spleen: Normal in size without significant abnormality. Adrenals/Urinary Tract: Adrenal glands are unremarkable. Kidneys are normal, without renal calculi, solid lesion, or hydronephrosis. Bladder is unremarkable. Stomach/Bowel: Stomach is within normal limits. Appendix is not clearly visualized and may be surgically absent. No evidence of bowel wall thickening, distention, or inflammatory changes. Vascular/Lymphatic: Aortic atherosclerosis. No enlarged abdominal or pelvic lymph nodes. Reproductive: No mass or other significant abnormality. Other: No abdominal wall hernia or abnormality. No abdominopelvic ascites. Musculoskeletal: No acute or significant osseous findings. IMPRESSION: 1. Redemonstrated diffuse inflammatory fat stranding and fluid about the pancreas, consistent with acute pancreatitis. 2. There is a new fluid collection anterior to the pancreatic head and uncinate measuring 4.4 x 3.3 cm, consistent with acute pancreatic fluid collection. 3. There is redemonstrated hypoenhancement  of the distal pancreatic body and tail with surrounding fluid, findings consistent with pancreatic parenchymal necrosis, which is similar in appearance to prior examination. 4. Hepatic steatosis. 5. Trace left pleural effusion associated atelectasis or consolidation, unchanged. 6. Coronary artery disease. Aortic Atherosclerosis (ICD10-I70.0). Electronically Signed   By: Eddie Candle M.D.   On: 10/05/2020 14:49   CT ABDOMEN PELVIS W CONTRAST  Result Date: 09/25/2020 CLINICAL DATA:  Follow-up severe acute pancreatitis. EXAM: CT ABDOMEN AND PELVIS WITH CONTRAST TECHNIQUE: Multidetector CT imaging of the abdomen and pelvis was performed using the standard protocol following bolus administration of intravenous contrast. CONTRAST:  65mL OMNIPAQUE IOHEXOL 350 MG/ML SOLN COMPARISON:  September 17, 2020 FINDINGS: Lower chest: Small LEFT effusion. Basilar atelectasis. Effusion as developed since the prior study.  Hepatobiliary: No focal, suspicious hepatic lesion. The portal vein is patent. SMV is patent. Splenic vein is diminutive but patent. Small amount of perihepatic ascites. No pericholecystic stranding or gross biliary duct distension. Pancreas: Severe peripancreatic stranding. Subjective diminished enhancement of the pancreatic tail relative to the head and neck. No focal fluid but with fluid in the interstices of retroperitoneal fascial planes of the anterior pararenal space and transverse mesocolon. Density of pancreatic parenchyma in the talus 20 Hounsfield units and the head approximately 45 Hounsfield units. Spleen: Normal spleen. Adrenals/Urinary Tract: Adrenal glands are normal. Symmetric renal enhancement. No hydronephrosis. Smooth contour the urinary bladder. Stomach/Bowel: Global small bowel distension which is moderate and without discrete transition. Distension also of the colon also without transition point. Liquid stool and gas throughout the colon. Vascular/Lymphatic: Calcified atheromatous plaque of the abdominal aorta without aneurysmal dilation. Smooth contour of the IVC. Scattered lymph nodes throughout the retroperitoneum and upper abdomen favored to be reactive in the setting of severe acute pancreatitis. Reproductive: Unremarkable. Other: Small fat containing umbilical and bilateral inguinal hernias. No free air. Trace fluid in the pelvis. Most of the fluid seen in the abdomen is a fascial planes and about the pancreas again without focal collection. Body wall edema. This is mild to moderate and along the RIGHT and LEFT flank. Musculoskeletal: No acute musculoskeletal process. Spinal degenerative changes. IMPRESSION: 1. Severe acute interstitial edematous pancreatitis likely transitioning into necrotic pancreatitis with early glandular and peripancreatic necrosis suspected. Diminished density mainly within the tail and distal body of the pancreas on the current study. Close attention on  follow-up. 2. Heterogeneous appearance of peripancreatic stranding without focal fluid collection. This stranding is increased since the prior study. 3. Minimal fluid in the abdomen and pelvis with trace ascites about the liver. 4. Signs of global ileus in the setting of pancreatitis. 5. Global small bowel distension which is moderate and without discrete transition point. Liquid stool and gas throughout the colon. Findings may represent ileus. 6. Small LEFT effusion and basilar atelectasis. 7. Developing anasarca with LEFT effusion and bilateral flank edema. 8. Aortic atherosclerosis. Aortic Atherosclerosis (ICD10-I70.0). Electronically Signed   By: Zetta Bills M.D.   On: 09/25/2020 14:35   CT ABDOMEN PELVIS W CONTRAST  Result Date: 09/17/2020 CLINICAL DATA:  Abdomen pain EXAM: CT ABDOMEN AND PELVIS WITH CONTRAST TECHNIQUE: Multidetector CT imaging of the abdomen and pelvis was performed using the standard protocol following bolus administration of intravenous contrast. CONTRAST:  13mL OMNIPAQUE IOHEXOL 300 MG/ML  SOLN COMPARISON:  Ultrasound 06/15/2019, CT chest 05/16/2019, 10/15/2017 FINDINGS: Lower chest: Lung bases demonstrate bandlike densities in the lingula and left base felt consistent with pulmonary scarring. No acute airspace disease or pleural effusion Hepatobiliary: Hepatic steatosis. No  calcified gallstone or biliary dilatation Pancreas: Moderate to marked peripancreatic inflammation consistent with acute pancreatitis. Homogeneous glandular enhancement. No organized fluid collection Spleen: Normal in size without focal abnormality. Adrenals/Urinary Tract: Adrenal glands are unremarkable. Kidneys are normal, without renal calculi, focal lesion, or hydronephrosis. Bladder is unremarkable. Stomach/Bowel: Stomach nonenlarged. Wall thickening of the second and third portion of duodenum. No dilated small bowel. No colon wall thickening Vascular/Lymphatic: Moderate aortic atherosclerosis. No aneurysm.  No suspicious nodes. Patent portal and splenic veins. Reproductive: Prostate is unremarkable. Other: No free air. Fluid within the left greater than right pararenal spaces. Musculoskeletal: No acute or significant osseous findings. IMPRESSION: 1. Findings consistent with acute pancreatitis. No organized fluid collections at this time. 2. Slight wall thickening of duodenum, either reactive or due to duodenitis 3. Hepatic steatosis Electronically Signed   By: Donavan Foil M.D.   On: 09/17/2020 22:22   DG Abd 2 Views  Result Date: 09/25/2020 CLINICAL DATA:  60 year old male with pancreatitis, abdominal pain and distension EXAM: ABDOMEN - 2 VIEW COMPARISON:  CT 09/17/2020, plain film 09/24/2020 FINDINGS: Air-fluid level within the stomach is unchanged. Similar pattern of dilated small bowel loops and colonic loops with multiple air-fluid levels. Relative paucity of rectal gas. No radiopaque foreign body.  No gastric tube identified. No displaced fracture IMPRESSION: Unchanged appearance of the abdomen plain film, with multiple air-fluid levels and gaseous distension of stomach, small bowel and colon compatible with ileus. Electronically Signed   By: Corrie Mckusick D.O.   On: 09/25/2020 10:36   DG Abd 2 Views  Result Date: 09/24/2020 CLINICAL DATA:  60 year old with abdominal distension. EXAM: ABDOMEN - 2 VIEW COMPARISON:  CT abdomen and pelvis 09/17/2020 FINDINGS: Patchy densities at the left lung base are suggestive for atelectasis and small amount of pleural fluid. Dilated gas-filled loops of small and large bowel. No evidence for free air. IMPRESSION: 1. Gas-filled loops of small and large bowel. Findings are suggestive for an adynamic ileus. 2. Left basilar densities are suggestive for small pleural effusion and atelectasis. Electronically Signed   By: Markus Daft M.D.   On: 09/24/2020 13:17   US Abdomen Limited RUQ (LIVER/GB)  Result Date: 09/18/2020 CLINICAL DATA:  Acute pancreatitis. EXAM: ULTRASOUND  ABDOMEN LIMITED RIGHT UPPER QUADRANT COMPARISON:  CT 09/17/2020. FINDINGS: Gallbladder: No gallstones or wall thickening visualized. No sonographic Murphy sign noted by sonographer. Common bile duct: Diameter: 6 mm Liver: Increased echogenicity consistent fatty infiltration or hepatocellular disease. Portal vein is patent on color Doppler imaging with normal direction of blood flow towards the liver. Other: Suboptimal exam due to patient's body habitus. IMPRESSION: 1.  No gallstones or biliary distention. 2. Increased hepatic echogenicity consistent fatty infiltration and/or hepatocellular disease. Electronically Signed   By: Marcello Moores  Register   On: 09/18/2020 07:55   (Echo, Carotid, EGD, Colonoscopy, ERCP)    Subjective: Patient seen and examined.  No overnight events.  He has not used any IV medications for the last 2 days.  He can manage his pain with oral oxycodone, however that needs to be frequent.  He had 1 semisolid bowel movement last night none since then.  Denies any nausea vomiting.  Tolerating soft diet.   Discharge Exam: Vitals:   10/08/20 2007 10/09/20 0502  BP: 138/77 (!) 123/92  Pulse: 84 89  Resp: 20 20  Temp: 97.9 F (36.6 C) 97.9 F (36.6 C)  SpO2: 100% 100%   Vitals:   10/08/20 0554 10/08/20 1228 10/08/20 2007 10/09/20 0502  BP: 123/80 122/80  138/77 (!) 123/92  Pulse: 80 83 84 89  Resp:  _0 Temp: 98 F (36.7 C) 98.4 F (36.9 C) 97.9 F (36.6 C) 97.9 F (36.6 C)  TempSrc: Oral Oral Oral Oral  SpO2: 96% 97% 100% 100%  Weight:      Height:        General: Pt is alert, awake, not in acute distress Cardiovascular: RRR, S1/S2 +, no rubs, no gallops Respiratory: CTA bilaterally, no wheezing, no rhonchi Abdominal: Soft, NT, ND, bowel sounds + Extremities: no edema, no cyanosis    The results of significant diagnostics from this hospitalization (including imaging, microbiology, ancillary and laboratory) are listed below for reference.      Microbiology: No results found for this or any previous visit (from the past 240 hour(s)).   Labs: BNP (last 3 results) No results for input(s): BNP in the last 8760 hours. Basic Metabolic Panel: Recent Labs  Lab 10/03/20 0507 10/06/20 0551  NA 135 137  K 4.0 4.0  CL 104 102  CO2 24 27  GLUCOSE 110* 116*  BUN <5* <5*  CREATININE 0.58* 0.74  CALCIUM 8.4* 8.9  MG 1.7 1.7  PHOS  --  5.0*   Liver Function Tests: Recent Labs  Lab 10/06/20 0551  AST 23  ALT 16  ALKPHOS 82  BILITOT 0.7  PROT 6.8  ALBUMIN 2.7*   No results for input(s): LIPASE, AMYLASE in the last 168 hours. No results for input(s): AMMONIA in the last 168 hours. CBC: Recent Labs  Lab 10/03/20 0507 10/06/20 0551  WBC 8.4 8.6  NEUTROABS  --  5.4  HGB 12.3* 13.2  HCT 37.4* 40.1  MCV 110.3* 106.6*  PLT 263 232   Cardiac Enzymes: No results for input(s): CKTOTAL, CKMB, CKMBINDEX, TROPONINI in the last 168 hours. BNP: Invalid input(s): POCBNP CBG: Recent Labs  Lab 10/08/20 0555 10/08/20 1055 10/08/20 1634 10/08/20 2307 10/09/20 0501  GLUCAP 158* 154* 110* 174* 134*   D-Dimer No results for input(s): DDIMER in the last 72 hours. Hgb A1c No results for input(s): HGBA1C in the last 72 hours. Lipid Profile No results for input(s): CHOL, HDL, LDLCALC, TRIG, CHOLHDL, LDLDIRECT in the last 72 hours. Thyroid function studies No results for input(s): TSH, T4TOTAL, T3FREE, THYROIDAB in the last 72 hours.  Invalid input(s): FREET3 Anemia work up No results for input(s): VITAMINB12, FOLATE, FERRITIN, TIBC, IRON, RETICCTPCT in the last 72 hours. Urinalysis    Component Value Date/Time   COLORURINE YELLOW 09/17/2020 2150   APPEARANCEUR CLEAR 09/17/2020 2150   LABSPEC 1.038 (H) 09/17/2020 2150   PHURINE 5.5 09/17/2020 2150   GLUCOSEU >1,000 (A) 09/17/2020 2150   HGBUR NEGATIVE 09/17/2020 2150   BILIRUBINUR NEGATIVE 09/17/2020 2150   KETONESUR 40 (A) 09/17/2020 2150   PROTEINUR TRACE (A)  09/17/2020 2150   NITRITE NEGATIVE 09/17/2020 2150   LEUKOCYTESUR NEGATIVE 09/17/2020 2150   Sepsis Labs Invalid input(s): PROCALCITONIN,  WBC,  LACTICIDVEN Microbiology No results found for this or any previous visit (from the past 240 hour(s)).   Time coordinating discharge:  35 minutes  SIGNED:   Barb Merino, MD  [facility] 10/09/2020, 10:36 AM

## 2020-10-09 NOTE — Discharge Instructions (Signed)

## 2020-10-09 NOTE — Plan of Care (Signed)
Nutrition Education Note  RD consulted for nutrition education regarding new diagnosis of type 2 diabetes.   Lab Results  Component Value Date   HGBA1C 9.4 (H) 09/18/2020   RD provided "Carbohydrate Counting for People with Diabetes" handout from the Academy of [employer] and Dietetics. Discussed different food groups and their effects on blood sugar, emphasizing carbohydrate-containing foods. Provided list of carbohydrates and recommended serving sizes of common foods.  Discussed importance of controlled and consistent carbohydrate intake throughout the day. Provided examples of ways to balance meals/snacks and encouraged intake of high-fiber, whole grain complex carbohydrates. Teach back method used.  Expect fair-good compliance.  Body mass index is 42.32 kg/m. Pt meets criteria for Obesity Class 3 based on current BMI.  Current diet order is Soft, patient is consuming 100% of meals at this time. Labs and medications reviewed.   RD to refer patient to outpatient RD for follow up on diabetes nutrition education.  No further nutrition interventions warranted at this time. RD contact information provided. If additional nutrition issues arise, please re-consult RD.  Vertell Limber, RD, LDN (she/her/hers) Registered Dietitian I After-Hours/Weekend Pager # in [place]

## 2020-10-10 ENCOUNTER — Telehealth: Payer: Self-pay

## 2020-10-10 MED ORDER — ACCU-CHEK GUIDE VI STRP: ORAL_STRIP | 2 refills | Status: DC

## 2020-10-10 NOTE — Addendum Note (Signed)
Addended by: Lois Huxley, Jeannett Senior L on: 10/10/2020 04:09 PM   Modules accepted: Orders

## 2020-10-10 NOTE — Telephone Encounter (Signed)
Transition Care Management Follow-up Telephone Call Date of discharge and from where: 10/09/2020, [facility]  How have you been since you were released from the hospital? He said he is managing pretty well so far.  He is still in pain from the pancreatitis and only has 4 more days of pain medication left. He is concerned about needing more medication after this runs out.  He has an appointment 10/12/2020 with Ms Meredeth Ide, NP and he will address his pain status at that time.  Informed him that there is no guarantee that he will be prescribed more pain medication from PCP.  Any questions or concerns? Yes - noted above  Items Reviewed: Did the pt receive and understand the discharge instructions provided? Yes  Medications obtained and verified? Yes  - he said he has all medications, no questions about the med regime Other? No  Any new allergies since your discharge? No  Dietary orders reviewed? No Do you have support at home?  Lives alone.  Home Care and Equipment/Supplies: Were home health services ordered? no If so, what is the name of the agency? N/a  Has the agency set up a time to come to the patient's home? not applicable Were any new equipment or medical supplies ordered?  Yes: glucometer What is the name of the medical supply agency? [facility] Were you able to get the supplies/equipment? He has the meter and lancets but no test strips  Do you have any questions related to the use of the equipment or supplies? No  Functional Questionnaire: (I = Independent and D = Dependent) ADLs: independent - has walker to use as needed.   Follow up appointments reviewed:  PCP Hospital f/u appt confirmed? Yes  Scheduled to see [name], NP on 10/12/2020  @ 1430. Specialist Hospital f/u appt confirmed? Yes  Scheduled to see neurology on 11/20/2020.  Are transportation arrangements needed?  He said he takes [employer] or will ask a friend. Provided him with the phone number for [facility] Medicaid  transportation for rides to medical appointments If their condition worsens, is the pt aware to call PCP or go to the Emergency Dept.? Yes Was the patient provided with contact information for the PCP's office or ED? Yes Was to pt encouraged to call back with questions or concerns? Yes

## 2020-10-12 ENCOUNTER — Encounter: Payer: Self-pay | Admitting: Nurse Practitioner

## 2020-10-12 ENCOUNTER — Ambulatory Visit: Payer: Medicaid Other | Attending: Nurse Practitioner | Admitting: Nurse Practitioner

## 2020-10-12 ENCOUNTER — Other Ambulatory Visit: Payer: Self-pay

## 2020-10-12 VITALS — BP 130/84 | HR 103 | Temp 98.2°F | Ht 73.0 in | Wt 286.8 lb

## 2020-10-12 DIAGNOSIS — Z86711 Personal history of pulmonary embolism: Secondary | ICD-10-CM | POA: Insufficient documentation

## 2020-10-12 DIAGNOSIS — F419 Anxiety disorder, unspecified: Secondary | ICD-10-CM

## 2020-10-12 DIAGNOSIS — E119 Type 2 diabetes mellitus without complications: Secondary | ICD-10-CM | POA: Insufficient documentation

## 2020-10-12 DIAGNOSIS — F5101 Primary insomnia: Secondary | ICD-10-CM | POA: Insufficient documentation

## 2020-10-12 DIAGNOSIS — K8500 Idiopathic acute pancreatitis without necrosis or infection: Secondary | ICD-10-CM | POA: Insufficient documentation

## 2020-10-12 DIAGNOSIS — Z7901 Long term (current) use of anticoagulants: Secondary | ICD-10-CM | POA: Insufficient documentation

## 2020-10-12 DIAGNOSIS — Z7984 Long term (current) use of oral hypoglycemic drugs: Secondary | ICD-10-CM | POA: Diagnosis not present

## 2020-10-12 DIAGNOSIS — Z09 Encounter for follow-up examination after completed treatment for conditions other than malignant neoplasm: Secondary | ICD-10-CM | POA: Diagnosis not present

## 2020-10-12 DIAGNOSIS — Z86718 Personal history of other venous thrombosis and embolism: Secondary | ICD-10-CM | POA: Diagnosis not present

## 2020-10-12 DIAGNOSIS — K760 Fatty (change of) liver, not elsewhere classified: Secondary | ICD-10-CM | POA: Diagnosis not present

## 2020-10-12 DIAGNOSIS — Z79899 Other long term (current) drug therapy: Secondary | ICD-10-CM | POA: Insufficient documentation

## 2020-10-12 MED ORDER — TRAZODONE HCL 50 MG PO TABS: 50.0000 mg | ORAL_TABLET | Freq: Every evening | ORAL | 0 refills | Status: DC | PRN

## 2020-10-12 MED ORDER — ACETAMINOPHEN-CODEINE #3 300-30 MG PO TABS
1.0000 | ORAL_TABLET | Freq: Two times a day (BID) | ORAL | 0 refills | Status: DC | PRN
Start: 2020-10-12 — End: 2020-10-21

## 2020-10-12 MED ORDER — HYDROXYZINE PAMOATE 25 MG PO CAPS: 25.0000 mg | ORAL_CAPSULE | Freq: Three times a day (TID) | ORAL | 0 refills | Status: DC | PRN
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
## 2020-10-17 ENCOUNTER — Inpatient Hospital Stay (HOSPITAL_COMMUNITY)
Admission: EM | Admit: 2020-10-17 | Discharge: 2020-10-21 | DRG: 642 | Disposition: A | Discharge status: Home / Self Care | Payer: Medicaid Other | Attending: Family Medicine | Admitting: Family Medicine

## 2020-10-17 ENCOUNTER — Encounter (HOSPITAL_COMMUNITY): Payer: Self-pay | Admitting: Emergency Medicine

## 2020-10-17 ENCOUNTER — Emergency Department (HOSPITAL_COMMUNITY): Payer: Medicaid Other

## 2020-10-17 DIAGNOSIS — R739 Hyperglycemia, unspecified: Secondary | ICD-10-CM

## 2020-10-17 DIAGNOSIS — E872 Acidosis: Secondary | ICD-10-CM | POA: Diagnosis present

## 2020-10-17 DIAGNOSIS — Z7901 Long term (current) use of anticoagulants: Secondary | ICD-10-CM

## 2020-10-17 DIAGNOSIS — K760 Fatty (change of) liver, not elsewhere classified: Secondary | ICD-10-CM | POA: Diagnosis present

## 2020-10-17 DIAGNOSIS — E1142 Type 2 diabetes mellitus with diabetic polyneuropathy: Secondary | ICD-10-CM | POA: Diagnosis present

## 2020-10-17 DIAGNOSIS — K863 Pseudocyst of pancreas: Secondary | ICD-10-CM | POA: Diagnosis present

## 2020-10-17 DIAGNOSIS — F419 Anxiety disorder, unspecified: Secondary | ICD-10-CM

## 2020-10-17 DIAGNOSIS — Z86711 Personal history of pulmonary embolism: Secondary | ICD-10-CM | POA: Diagnosis not present

## 2020-10-17 DIAGNOSIS — Z79899 Other long term (current) drug therapy: Secondary | ICD-10-CM | POA: Diagnosis not present

## 2020-10-17 DIAGNOSIS — K8581 Other acute pancreatitis with uninfected necrosis: Secondary | ICD-10-CM | POA: Diagnosis present

## 2020-10-17 DIAGNOSIS — Z7984 Long term (current) use of oral hypoglycemic drugs: Secondary | ICD-10-CM | POA: Diagnosis not present

## 2020-10-17 DIAGNOSIS — R109 Unspecified abdominal pain: Secondary | ICD-10-CM | POA: Diagnosis not present

## 2020-10-17 DIAGNOSIS — Z86718 Personal history of other venous thrombosis and embolism: Secondary | ICD-10-CM | POA: Diagnosis not present

## 2020-10-17 DIAGNOSIS — T730XXA Starvation, initial encounter: Secondary | ICD-10-CM | POA: Diagnosis present

## 2020-10-17 DIAGNOSIS — E8889 Other specified metabolic disorders: Secondary | ICD-10-CM | POA: Diagnosis present

## 2020-10-17 DIAGNOSIS — R112 Nausea with vomiting, unspecified: Secondary | ICD-10-CM

## 2020-10-17 DIAGNOSIS — E1136 Type 2 diabetes mellitus with diabetic cataract: Secondary | ICD-10-CM | POA: Diagnosis present

## 2020-10-17 DIAGNOSIS — K859 Acute pancreatitis without necrosis or infection, unspecified: Secondary | ICD-10-CM | POA: Diagnosis not present

## 2020-10-17 DIAGNOSIS — F17210 Nicotine dependence, cigarettes, uncomplicated: Secondary | ICD-10-CM | POA: Diagnosis present

## 2020-10-17 DIAGNOSIS — R197 Diarrhea, unspecified: Secondary | ICD-10-CM | POA: Diagnosis not present

## 2020-10-17 DIAGNOSIS — E1165 Type 2 diabetes mellitus with hyperglycemia: Secondary | ICD-10-CM | POA: Diagnosis present

## 2020-10-17 DIAGNOSIS — Z20822 Contact with and (suspected) exposure to covid-19: Secondary | ICD-10-CM | POA: Diagnosis present

## 2020-10-17 LAB — CBG MONITORING, ED
Glucose-Capillary: 193 mg/dL — ABNORMAL HIGH (ref 70–99)
Glucose-Capillary: 51 mg/dL — ABNORMAL LOW (ref 70–99)
Glucose-Capillary: 171 mg/dL — ABNORMAL HIGH (ref 70–99)
Glucose-Capillary: 64 mg/dL — ABNORMAL LOW (ref 70–99)
Glucose-Capillary: 161 mg/dL — ABNORMAL HIGH (ref 70–99)
Glucose-Capillary: 147 mg/dL — ABNORMAL HIGH (ref 70–99)
Glucose-Capillary: 150 mg/dL — ABNORMAL HIGH (ref 70–99)

## 2020-10-17 LAB — BASIC METABOLIC PANEL
Anion gap: 4 — ABNORMAL LOW (ref 5–15)
Anion gap: 9 (ref 5–15)
BUN: 7 mg/dL (ref 6–20)
BUN: 9 mg/dL (ref 6–20)
CO2: 19 mmol/L — ABNORMAL LOW (ref 22–32)
CO2: 21 mmol/L — ABNORMAL LOW (ref 22–32)
Calcium: 6.1 mg/dL — CL (ref 8.9–10.3)
Calcium: 8.5 mg/dL — ABNORMAL LOW (ref 8.9–10.3)
Chloride: 119 mmol/L — ABNORMAL HIGH (ref 98–111)
Chloride: 108 mmol/L (ref 98–111)
Creatinine, Ser: 0.66 mg/dL (ref 0.61–1.24)
Creatinine, Ser: 0.79 mg/dL (ref 0.61–1.24)
GFR, Estimated: >60 mL/min (ref >60)
GFR, Estimated: >60 mL/min (ref >60)
Glucose, Bld: 143 mg/dL — ABNORMAL HIGH (ref 70–99)
Glucose, Bld: 156 mg/dL — ABNORMAL HIGH (ref 70–99)
Potassium: 2.4 mmol/L — CL (ref 3.5–5.1)
Potassium: 3.4 mmol/L — ABNORMAL LOW (ref 3.5–5.1)
Sodium: 142 mmol/L (ref 135–145)
Sodium: 138 mmol/L (ref 135–145)

## 2020-10-17 LAB — CBC WITH DIFFERENTIAL/PLATELET
Abs Immature Granulocytes: 0.06 10*3/uL (ref 0.00–0.07)
Basophils Absolute: 0.1 10*3/uL (ref 0.0–0.1)
Basophils Relative: 1 %
Eosinophils Absolute: 0.2 10*3/uL (ref 0.0–0.5)
Eosinophils Relative: 2 %
HCT: 43.9 % (ref 39.0–52.0)
Hemoglobin: 14.7 g/dL (ref 13.0–17.0)
Immature Granulocytes: 1 %
Lymphocytes Relative: 23 %
Lymphs Abs: 2.5 10*3/uL (ref 0.7–4.0)
MCH: 33.9 pg (ref 26.0–34.0)
MCHC: 33.5 g/dL (ref 30.0–36.0)
MCV: 101.4 fL — ABNORMAL HIGH (ref 80.0–100.0)
Monocytes Absolute: 0.6 10*3/uL (ref 0.1–1.0)
Monocytes Relative: 5 %
Neutro Abs: 7.5 10*3/uL (ref 1.7–7.7)
Neutrophils Relative %: 68 %
Platelets: 323 10*3/uL (ref 150–400)
RBC: 4.33 MIL/uL (ref 4.22–5.81)
RDW: 13.2 % (ref 11.5–15.5)
WBC: 10.9 10*3/uL — ABNORMAL HIGH (ref 4.0–10.5)
nRBC: 0 % (ref 0.0–0.2)

## 2020-10-17 LAB — URINALYSIS, ROUTINE W REFLEX MICROSCOPIC
Bacteria, UA: NONE SEEN
Bilirubin Urine: NEGATIVE
Glucose, UA: >=500 mg/dL — AB
Hgb urine dipstick: NEGATIVE
Ketones, ur: 20 mg/dL — AB
Leukocytes,Ua: NEGATIVE
Nitrite: NEGATIVE
Protein, ur: 30 mg/dL — AB
Specific Gravity, Urine: 1.028 (ref 1.005–1.030)
pH: 6 (ref 5.0–8.0)

## 2020-10-17 LAB — BLOOD GAS, VENOUS
Acid-base deficit: 2.1 mmol/L — ABNORMAL HIGH (ref 0.0–2.0)
Bicarbonate: 21.6 mmol/L (ref 20.0–28.0)
O2 Saturation: 40.1 %
Patient temperature: 98.6
pCO2, Ven: 35.8 mmHg — ABNORMAL LOW (ref 44.0–60.0)
pH, Ven: 7.399 (ref 7.250–7.430)
pO2, Ven: 24.1 mmHg — CL (ref 32.0–45.0)

## 2020-10-17 LAB — COMPREHENSIVE METABOLIC PANEL
ALT: 18 U/L (ref 0–44)
AST: 31 U/L (ref 15–41)
Albumin: 3.5 g/dL (ref 3.5–5.0)
Alkaline Phosphatase: 67 U/L (ref 38–126)
Anion gap: 12 (ref 5–15)
BUN: 10 mg/dL (ref 6–20)
CO2: 22 mmol/L (ref 22–32)
Calcium: 9.2 mg/dL (ref 8.9–10.3)
Chloride: 103 mmol/L (ref 98–111)
Creatinine, Ser: 1.06 mg/dL (ref 0.61–1.24)
GFR, Estimated: >60 mL/min (ref >60)
Glucose, Bld: 382 mg/dL — ABNORMAL HIGH (ref 70–99)
Potassium: 3.8 mmol/L (ref 3.5–5.1)
Sodium: 137 mmol/L (ref 135–145)
Total Bilirubin: 0.9 mg/dL (ref 0.3–1.2)
Total Protein: 7.4 g/dL (ref 6.5–8.1)

## 2020-10-17 LAB — BETA-HYDROXYBUTYRIC ACID
Beta-Hydroxybutyric Acid: 1.4 mmol/L — ABNORMAL HIGH (ref 0.05–0.27)
Beta-Hydroxybutyric Acid: 0.25 mmol/L (ref 0.05–0.27)

## 2020-10-17 LAB — MAGNESIUM
Magnesium: 1.5 mg/dL — ABNORMAL LOW (ref 1.7–2.4)
Magnesium: 1.9 mg/dL (ref 1.7–2.4)

## 2020-10-17 LAB — LIPASE, BLOOD: Lipase: 30 U/L (ref 11–51)

## 2020-10-17 LAB — RESP PANEL BY RT-PCR (FLU A&B, COVID) ARPGX2
Influenza A by PCR: NEGATIVE
Influenza B by PCR: NEGATIVE
SARS Coronavirus 2 by RT PCR: NEGATIVE

## 2020-10-17 MED ORDER — HYDROMORPHONE HCL 1 MG/ML IJ SOLN
0.5000 mg | INTRAMUSCULAR | Status: DC | PRN
  Administered 2020-10-17 – 2020-10-19 (×12): 1 mg via INTRAVENOUS
  Filled 2020-10-17 (×12): qty 1

## 2020-10-17 MED ORDER — ONDANSETRON HCL 4 MG/2ML IJ SOLN
4.0000 mg | Freq: Once | INTRAMUSCULAR | Status: AC
  Administered 2020-10-17: 4 mg via INTRAVENOUS
  Filled 2020-10-17: qty 2

## 2020-10-17 MED ORDER — INSULIN ASPART 100 UNIT/ML IJ SOLN
0.0000 [IU] | Freq: Three times a day (TID) | INTRAMUSCULAR | Status: DC
  Administered 2020-10-18: 2 [IU] via SUBCUTANEOUS
  Administered 2020-10-18 – 2020-10-19 (×4): 3 [IU] via SUBCUTANEOUS
  Administered 2020-10-19 – 2020-10-20 (×2): 5 [IU] via SUBCUTANEOUS
  Administered 2020-10-20 (×2): 3 [IU] via SUBCUTANEOUS
  Administered 2020-10-21: 5 [IU] via SUBCUTANEOUS
  Filled 2020-10-17: qty 0.15

## 2020-10-17 MED ORDER — POTASSIUM CHLORIDE CRYS ER 20 MEQ PO TBCR
40.0000 meq | EXTENDED_RELEASE_TABLET | Freq: Once | ORAL | Status: AC
  Administered 2020-10-17: 40 meq via ORAL
  Filled 2020-10-17: qty 2

## 2020-10-17 MED ORDER — MAGNESIUM SULFATE 2 GM/50ML IV SOLN
2.0000 g | Freq: Once | INTRAVENOUS | Status: AC
  Administered 2020-10-17: 2 g via INTRAVENOUS
  Filled 2020-10-17: qty 50

## 2020-10-17 MED ORDER — HYDROMORPHONE HCL 1 MG/ML IJ SOLN
1.0000 mg | Freq: Once | INTRAMUSCULAR | Status: AC
  Administered 2020-10-17: 1 mg via INTRAVENOUS
  Filled 2020-10-17: qty 1

## 2020-10-17 MED ORDER — APIXABAN 5 MG PO TABS
5.0000 mg | ORAL_TABLET | Freq: Two times a day (BID) | ORAL | Status: DC
  Administered 2020-10-17 – 2020-10-21 (×8): 5 mg via ORAL
  Filled 2020-10-17 (×8): qty 1

## 2020-10-17 MED ORDER — INSULIN DETEMIR 100 UNIT/ML ~~LOC~~ SOLN
5.0000 [IU] | Freq: Once | SUBCUTANEOUS | Status: AC
  Administered 2020-10-17: 5 [IU] via SUBCUTANEOUS
  Filled 2020-10-17: qty 0.05

## 2020-10-17 MED ORDER — TRAZODONE HCL 100 MG PO TABS: 50.0000 mg | ORAL_TABLET | Freq: Every evening | ORAL | Status: DC | PRN

## 2020-10-17 MED ORDER — NORTRIPTYLINE HCL 10 MG PO CAPS
10.0000 mg | ORAL_CAPSULE | Freq: Every day | ORAL | Status: DC
  Administered 2020-10-17 – 2020-10-20 (×4): 10 mg via ORAL
  Filled 2020-10-17 (×4): qty 1

## 2020-10-17 MED ORDER — INSULIN ASPART 100 UNIT/ML IJ SOLN
8.0000 [IU] | Freq: Once | INTRAMUSCULAR | Status: AC
  Administered 2020-10-17: 8 [IU] via SUBCUTANEOUS
  Filled 2020-10-17: qty 0.08

## 2020-10-17 MED ORDER — LORAZEPAM 0.5 MG PO TABS
0.5000 mg | ORAL_TABLET | Freq: Once | ORAL | Status: AC
  Administered 2020-10-17: 0.5 mg via ORAL
  Filled 2020-10-17: qty 1

## 2020-10-17 MED ORDER — PANTOPRAZOLE SODIUM 40 MG PO TBEC
40.0000 mg | DELAYED_RELEASE_TABLET | Freq: Every day | ORAL | Status: DC
  Administered 2020-10-17 – 2020-10-21 (×5): 40 mg via ORAL
  Filled 2020-10-17 (×5): qty 1

## 2020-10-17 MED ORDER — DEXTROSE 50 % IV SOLN
0.0000 mL | INTRAVENOUS | Status: DC | PRN
  Administered 2020-10-17: 50 mL via INTRAVENOUS
  Filled 2020-10-17: qty 50

## 2020-10-17 MED ORDER — IOHEXOL 350 MG/ML SOLN
100.0000 mL | Freq: Once | INTRAVENOUS | Status: AC | PRN
  Administered 2020-10-17: 80 mL via INTRAVENOUS

## 2020-10-17 MED ORDER — INSULIN ASPART 100 UNIT/ML IJ SOLN
0.0000 [IU] | Freq: Every day | INTRAMUSCULAR | Status: DC
  Filled 2020-10-17: qty 0.05

## 2020-10-17 MED ORDER — SUCRALFATE 1 G PO TABS
1.0000 g | ORAL_TABLET | Freq: Three times a day (TID) | ORAL | Status: DC
  Administered 2020-10-17 – 2020-10-21 (×14): 1 g via ORAL
  Filled 2020-10-17 (×14): qty 1

## 2020-10-17 MED ORDER — OXYCODONE HCL 5 MG PO TABS
5.0000 mg | ORAL_TABLET | ORAL | Status: DC | PRN
Start: 2020-10-17 — End: 2020-10-19
  Administered 2020-10-18 – 2020-10-19 (×4): 5 mg via ORAL
  Filled 2020-10-17 (×4): qty 1

## 2020-10-17 MED ORDER — LACTATED RINGERS IV SOLN: INTRAVENOUS | Status: DC

## 2020-10-17 MED ORDER — SODIUM CHLORIDE 0.9 % IV BOLUS
1000.0000 mL | Freq: Once | INTRAVENOUS | Status: AC
  Administered 2020-10-17: 1000 mL via INTRAVENOUS

## 2020-10-17 MED ORDER — PANCRELIPASE (LIP-PROT-AMYL) 12000-38000 UNITS PO CPEP
12000.0000 [IU] | ORAL_CAPSULE | Freq: Three times a day (TID) | ORAL | Status: DC
  Administered 2020-10-18 – 2020-10-21 (×9): 12000 [IU] via ORAL
  Filled 2020-10-17 (×11): qty 1

## 2020-10-17 MED ORDER — INSULIN REGULAR(HUMAN) IN NACL 100-0.9 UT/100ML-% IV SOLN
INTRAVENOUS | Status: DC
  Administered 2020-10-17: 7.5 [IU]/h via INTRAVENOUS
  Administered 2020-10-17: 3 [IU]/h via INTRAVENOUS
  Filled 2020-10-17 (×2): qty 100

## 2020-10-17 MED ORDER — SODIUM CHLORIDE 0.9 % IV SOLN: INTRAVENOUS | Status: DC

## 2020-10-17 MED ORDER — DEXTROSE IN LACTATED RINGERS 5 % IV SOLN: INTRAVENOUS | Status: DC
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
## 2020-10-17 NOTE — Progress Notes (Addendum)
    OVERNIGHT PROGRESS REPORT  Pt experienced a hypoglycemic episode while on Insulin drip/Endotool.  RN administered Dextrose per protocol and  D5LR is infusing now  Recheck CBGs yielded ...55....171   Insulin drip is "0" per Endotool will be adjusted by RN as indicated  Labs rescheduled  Next Lab draw at 2200  Will replace electrolytes as indicated  Update 0130 Hrs: patient has been transitioned off of Insulin drip   Chinita Greenland MSNA MSN ACNPC-AG Acute Care Nurse Practitioner [facility]

## 2020-10-17 NOTE — ED Triage Notes (Signed)
Patient c/o abdominal pain with N/V/D x3 days. Reports unable to tolerate PO. Recent admission for pancreatitis. Denies alcohol intake since admission.

## 2020-10-17 NOTE — ED Notes (Signed)
D5LR infusing in left AC IV.

## 2020-10-17 NOTE — Hospital Course (Signed)
60 y/o M DVT 2019 + PE  Prior Lung Emypema  Known pancreatitis
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
## 2020-10-18 ENCOUNTER — Other Ambulatory Visit: Payer: Self-pay

## 2020-10-18 DIAGNOSIS — E8729 Other acidosis: Secondary | ICD-10-CM | POA: Diagnosis present

## 2020-10-18 DIAGNOSIS — E872 Acidosis: Secondary | ICD-10-CM | POA: Diagnosis present

## 2020-10-18 DIAGNOSIS — T730XXA Starvation, initial encounter: Secondary | ICD-10-CM | POA: Diagnosis present

## 2020-10-18 LAB — BASIC METABOLIC PANEL
Anion gap: 9 (ref 5–15)
Anion gap: 7 (ref 5–15)
Anion gap: 7 (ref 5–15)
BUN: 8 mg/dL (ref 6–20)
BUN: 8 mg/dL (ref 6–20)
BUN: 8 mg/dL (ref 6–20)
CO2: 22 mmol/L (ref 22–32)
CO2: 24 mmol/L (ref 22–32)
CO2: 21 mmol/L — ABNORMAL LOW (ref 22–32)
Calcium: 8.7 mg/dL — ABNORMAL LOW (ref 8.9–10.3)
Calcium: 8.6 mg/dL — ABNORMAL LOW (ref 8.9–10.3)
Calcium: 8.6 mg/dL — ABNORMAL LOW (ref 8.9–10.3)
Chloride: 108 mmol/L (ref 98–111)
Chloride: 107 mmol/L (ref 98–111)
Chloride: 110 mmol/L (ref 98–111)
Creatinine, Ser: 0.85 mg/dL (ref 0.61–1.24)
Creatinine, Ser: 0.83 mg/dL (ref 0.61–1.24)
Creatinine, Ser: 0.75 mg/dL (ref 0.61–1.24)
GFR, Estimated: >60 mL/min (ref >60)
GFR, Estimated: >60 mL/min (ref >60)
GFR, Estimated: >60 mL/min (ref >60)
Glucose, Bld: 177 mg/dL — ABNORMAL HIGH (ref 70–99)
Glucose, Bld: 203 mg/dL — ABNORMAL HIGH (ref 70–99)
Glucose, Bld: 187 mg/dL — ABNORMAL HIGH (ref 70–99)
Potassium: 3.9 mmol/L (ref 3.5–5.1)
Potassium: 3.9 mmol/L (ref 3.5–5.1)
Potassium: 3.9 mmol/L (ref 3.5–5.1)
Sodium: 139 mmol/L (ref 135–145)
Sodium: 138 mmol/L (ref 135–145)
Sodium: 138 mmol/L (ref 135–145)

## 2020-10-18 LAB — GLUCOSE, CAPILLARY
Glucose-Capillary: 144 mg/dL — ABNORMAL HIGH (ref 70–99)
Glucose-Capillary: 158 mg/dL — ABNORMAL HIGH (ref 70–99)
Glucose-Capillary: 115 mg/dL — ABNORMAL HIGH (ref 70–99)

## 2020-10-18 LAB — CBG MONITORING, ED
Glucose-Capillary: 167 mg/dL — ABNORMAL HIGH (ref 70–99)
Glucose-Capillary: 172 mg/dL — ABNORMAL HIGH (ref 70–99)
Glucose-Capillary: 194 mg/dL — ABNORMAL HIGH (ref 70–99)

## 2020-10-18 MED ORDER — MELATONIN 3 MG PO TABS
3.0000 mg | ORAL_TABLET | Freq: Once | ORAL | Status: AC
  Administered 2020-10-18: 3 mg via ORAL
  Filled 2020-10-18: qty 1

## 2020-10-18 MED ORDER — HYDROMORPHONE HCL 2 MG/ML IJ SOLN
2.0000 mg | Freq: Once | INTRAMUSCULAR | Status: AC | PRN
  Administered 2020-10-18: 2 mg via INTRAVENOUS
  Filled 2020-10-18: qty 1

## 2020-10-18 MED ORDER — SODIUM CHLORIDE 0.9 % IV SOLN: INTRAVENOUS | Status: DC

## 2020-10-18 NOTE — Progress Notes (Signed)
PROGRESS NOTE   Bryce Mendoza  VVO:[id number] DOB: June 28, 1960 DOA: 10/17/2020 PCP: Claiborne Rigg, NP  Brief Narrative:  83Y male community dwelling DVT 2019 + PE Prior Lung Emypema, severe polyneuropathy unclear etiology   Known pancreatitis admitted to the hospital 7/18 through 10/09/2020 developed necrotizing pancreatitis prolonged ABX difficulty tolerating diet and was managed on discharge with limited opiates at that hospitalization He was also diagnosed with DM TY 2 with A1c of 9.4 and he was sent home on glipizide and metformin twice daily as he was reluctant to use   3 to 4 days history in the outpatient setting of nausea vomiting when trying to graduate his diet culminating in admission again 8/17 with starvation ketosis  Hospital-Problem based course  Starvation ketosis Anion gap not elevated on admission so was not DKA and that was ruled out Cont fluids for now NSL if tol PO See below Pancreatitis necrotizing with pancreatic pseudocyst High risk for worsening therefore will allow for soft diet but we will asked that the patient go slow as it appears he was attempting to graduate things in the outpatient setting but that did not go well If no improvement will need to reconsult GI or general surgery Uncontrolled diabetes mellitus Last admission patient preferred oral medications although A1c was above 8 and this admission may benefit from initiation of insulin Sugars are between 170 and 200 Prior lung decortication for empyema   DVT prophylaxis: Eliquis Code Status: Full Family Communication: None Disposition:  Status is: Inpatient  Remains inpatient appropriate because:Hemodynamically unstable, Altered mental status, Ongoing diagnostic testing needed not appropriate for outpatient work up, and IV treatments appropriate due to intensity of illness or inability to take PO  Dispo: The patient is from: Home              Anticipated d/c is to: Home              Patient  currently is not medically stable to d/c.   Difficult to place patient No       Consultants:  n  Procedures: n  Antimicrobials: n    Subjective: Awake coherent tolerated some liquids overnight thinks he can have crackers Admits that several days prior to worsening of symptoms he was trying to graduate his diet Long discussion about pseudocyst and the need to "go slow" with diet We discussed starting him on a soft diet today to see how he does  Objective: Vitals:   10/18/20 0600 10/18/20 0630 10/18/20 1136 10/18/20 1137  BP: (!) 118/91 134/90  132/78  Pulse: 71 96 76 74  Resp: 15 16  18   Temp:    98.2 F (36.8 C)  TempSrc:    Oral  SpO2: 90% 91% 93% 95%    Intake/Output Summary (Last 24 hours) at 10/18/2020 1140 Last data filed at 10/18/2020 10/20/2020 Gross per 24 hour  Intake 1643.68 ml  Output --  Net 1643.68 ml   There were no vitals filed for this visit.  Examination:  Thick neck Mallampati 4 no distress EOMI NCAT no focal deficit Cta b no added sound  Abd soft nt nd no rebound no guard Rom intact no focal defciit Neuro intact  Data Reviewed: personally reviewed   CBC    Component Value Date/Time   WBC 10.9 (H) 10/17/2020 1122   RBC 4.33 10/17/2020 1122   HGB 14.7 10/17/2020 1122   HGB 14.8 07/12/2020 1341   HCT 43.9 10/17/2020 1122   HCT 40.9 07/12/2020 1341  PLT 323 10/17/2020 1122   PLT 206 07/12/2020 1341   MCV 101.4 (H) 10/17/2020 1122   MCV 110 (H) 07/12/2020 1341   MCH 33.9 10/17/2020 1122   MCHC 33.5 10/17/2020 1122   RDW 13.2 10/17/2020 1122   RDW 14.4 07/12/2020 1341   LYMPHSABS 2.5 10/17/2020 1122   MONOABS 0.6 10/17/2020 1122   EOSABS 0.2 10/17/2020 1122   BASOSABS 0.1 10/17/2020 1122   CMP Latest Ref Rng & Units 10/18/2020 10/18/2020 10/18/2020  Glucose 70 - 99 mg/dL 419(Q) 222(L) 798(X)  BUN 6 - 20 mg/dL 8 8 8   Creatinine 0.61 - 1.24 mg/dL 2.11 9.41  Sodium 135 - 145 mmol/L 138 138 139  Potassium 3.5 - 5.1 mmol/L 3.9 3.9 3.9   Chloride 98 - 111 mmol/L 110 107 108  CO2 22 - 32 mmol/L 21(L) 24 22  Calcium 8.9 - 10.3 mg/dL 7.40) 8.1(K) 4.8(J)  Total Protein 6.5 - 8.1 g/dL - - -  Total Bilirubin 0.3 - 1.2 mg/dL - - -  Alkaline Phos 38 - 126 U/L - - -  AST 15 - 41 U/L - - -  ALT 0 - 44 U/L - - -     Radiology Studies: CT Abdomen Pelvis W Contrast  Result Date: 10/17/2020 CLINICAL DATA:  Diffuse abdominal pain.  History of pancreatitis EXAM: CT ABDOMEN AND PELVIS WITH CONTRAST TECHNIQUE: Multidetector CT imaging of the abdomen and pelvis was performed using the standard protocol following bolus administration of intravenous contrast. CONTRAST:  46mL OMNIPAQUE IOHEXOL 350 MG/ML SOLN COMPARISON:  10/05/2020 FINDINGS: Lower chest: Bibasilar atelectasis.  Heart size is normal. Hepatobiliary: Diffusely decreased attenuation of the hepatic parenchyma. No focal liver lesion identified. Unremarkable gallbladder. No hyperdense gallstone. No intrahepatic biliary dilatation. Pancreas: Inflammatory stranding with a small amount of free fluid adjacent to the pancreatic body and tail. Similar appearance of hypoenhancement of the distal pancreatic body and tail with surrounding fluid. Partial peripheral enhancement of fluid surrounding the pancreatic body and tail, measuring approximately 13.6 x 4.5 x 4.7 cm. Fluid collection along the anterior aspect of the pancreatic head measures approximately 5.0 x 2.8 x 3.9 cm (previously measured approximately 6.2 x 3.2 x 4.4 cm on 10/05/2020). Spleen: Normal in size without focal abnormality. Adrenals/Urinary Tract: Unremarkable adrenal glands. Kidneys enhance symmetrically without focal lesion, stone, or hydronephrosis. Ureters are nondilated. Urinary bladder appears unremarkable for the degree of distension. Stomach/Bowel: Stomach is within normal limits. Appendix not visualized. No evidence of bowel wall thickening, distention, or inflammatory changes. Vascular/Lymphatic: Scattered aortoiliac  atherosclerotic calcifications without aneurysm. Multiple mildly prominent upper abdominal lymph nodes are stable from prior and likely reactive. No intrapelvic adenopathy. Reproductive: Prostate is unremarkable. Other: No pneumoperitoneum.  Small fat containing umbilical hernia. Musculoskeletal: No acute or significant osseous findings. Degenerative disc disease of L5-S1. IMPRESSION: 1. Continued evolution of acute necrotizing pancreatitis with similar appearance of hypoenhancement of the distal pancreatic body and tail with surrounding fluid. Progressive partial peripheral enhancement of fluid surrounding the pancreatic body and tail suggestive of developing pseudocyst. 2. Slight interval decrease in size of pseudocyst along the anterior aspect of the pancreatic head measuring up to 5.0 cm (previously 6.2 cm on 10/05/2020). 3. Hepatic steatosis. 4. Aortic atherosclerosis (ICD10-I70.0). Electronically Signed   By: 12/05/2020 D.O.   On: 10/17/2020 15:53     Scheduled Meds:  apixaban  5 mg Oral BID   insulin aspart  0-15 Units Subcutaneous TID WC   insulin aspart  0-5 Units Subcutaneous QHS  lipase/protease/amylase  12,000 Units Oral TID AC   nortriptyline  10 mg Oral QHS   pantoprazole  40 mg Oral Daily   sucralfate  1 g Oral TID WC & HS   Continuous Infusions:  sodium chloride 75 mL/hr at 10/18/20 0909     LOS: 1 day   Time spent: 68  Rhetta Mura, MD [facility] To contact the attending provider between 7A-7P or the covering provider during after hours 7P-7A, please log into the web site [email or web address] and access using universal [place] password for that web site. If you do not have the password, please call the hospital operator.  10/18/2020, 11:40 AM

## 2020-10-18 NOTE — ED Notes (Signed)
Patient medicated with PRN medication for a 6/10 abdominal pain

## 2020-10-18 NOTE — Progress Notes (Addendum)
Inpatient Diabetes Program Recommendations  AACE/ADA: New Consensus Statement on Inpatient Glycemic Control (2015)  Target Ranges:  Prepandial:   less than 140 mg/dL      Peak postprandial:   less than 180 mg/dL (1-2 hours)      Critically ill patients:  140 - 180 mg/dL   Lab Results  Component Value Date   GLUCAP 194 (H) 10/18/2020   HGBA1C 9.4 (H) 09/18/2020    Review of Glycemic Control  Diabetes history: DM2 Outpatient Diabetes medications: glipizide 5 mg QAM, metformin 500 mg BID Current orders for Inpatient glycemic control: Novolog 0-15 units TID with meals and 0-5 HS  HgbA1C - 9.4%  Inpatient Diabetes Program Recommendations:    Will likely need small amount of basal insulin. Also may need meal coverage insulin if post-prandials > 180 mg/dL.  Will speak with pt this am. Pt was educated on diabetes survival skills, insulin pen administration and glucose monitoring at previous admission a few weeks ago.   Follow glucose trends.   Thank you. Ailene Ards, RD, LDN, CDE Inpatient Diabetes Coordinator [phone]   Addendum:  Spoke with pt at bedside regarding his insulin regimen at home. Pt states he checks blood sugars at various times during the day and they "are mostly below 200 mg/dL." We discussed how he may need insulin at home, and asked if he is agreeable to this. He said he'd rather control it with diet, but will agree to insulin if needed. Was taught insulin pen administration last month. May need another review prior to discharge, but states he knows how to inject.  Continue to follow while inpatient.   Thank you. Ailene Ards, RD, LDN, CDE Inpatient Diabetes Coordinator [phone]

## 2020-10-19 LAB — BASIC METABOLIC PANEL
Anion gap: 8 (ref 5–15)
BUN: 5 mg/dL — ABNORMAL LOW (ref 6–20)
CO2: 20 mmol/L — ABNORMAL LOW (ref 22–32)
Calcium: 8.5 mg/dL — ABNORMAL LOW (ref 8.9–10.3)
Chloride: 108 mmol/L (ref 98–111)
Creatinine, Ser: 0.55 mg/dL — ABNORMAL LOW (ref 0.61–1.24)
GFR, Estimated: >60 mL/min (ref >60)
Glucose, Bld: 208 mg/dL — ABNORMAL HIGH (ref 70–99)
Potassium: 3.9 mmol/L (ref 3.5–5.1)
Sodium: 136 mmol/L (ref 135–145)

## 2020-10-19 LAB — GLUCOSE, CAPILLARY
Glucose-Capillary: 219 mg/dL — ABNORMAL HIGH (ref 70–99)
Glucose-Capillary: 170 mg/dL — ABNORMAL HIGH (ref 70–99)
Glucose-Capillary: 185 mg/dL — ABNORMAL HIGH (ref 70–99)
Glucose-Capillary: 156 mg/dL — ABNORMAL HIGH (ref 70–99)

## 2020-10-19 MED ORDER — INSULIN GLARGINE-YFGN 100 UNIT/ML ~~LOC~~ SOLN
6.0000 [IU] | Freq: Every day | SUBCUTANEOUS | Status: DC
  Administered 2020-10-19 – 2020-10-21 (×3): 6 [IU] via SUBCUTANEOUS
  Filled 2020-10-19 (×3): qty 0.06

## 2020-10-19 MED ORDER — DICYCLOMINE HCL 10 MG PO CAPS
10.0000 mg | ORAL_CAPSULE | Freq: Three times a day (TID) | ORAL | Status: DC
  Administered 2020-10-19 – 2020-10-21 (×7): 10 mg via ORAL
  Filled 2020-10-19 (×8): qty 1

## 2020-10-19 MED ORDER — OXYCODONE HCL 5 MG PO TABS
5.0000 mg | ORAL_TABLET | ORAL | Status: DC | PRN
Start: 2020-10-19 — End: 2020-10-21
  Administered 2020-10-19 – 2020-10-21 (×13): 5 mg via ORAL
  Filled 2020-10-19 (×13): qty 1

## 2020-10-19 MED ORDER — ADULT MULTIVITAMIN W/MINERALS CH
1.0000 | ORAL_TABLET | Freq: Every day | ORAL | Status: DC
  Administered 2020-10-19 – 2020-10-21 (×3): 1 via ORAL
  Filled 2020-10-19 (×3): qty 1

## 2020-10-19 MED ORDER — ENSURE MAX PROTEIN PO LIQD
11.0000 [oz_av] | Freq: Two times a day (BID) | ORAL | Status: DC
  Administered 2020-10-20 – 2020-10-21 (×2): 11 [oz_av] via ORAL
  Filled 2020-10-19 (×3): qty 330

## 2020-10-19 MED ORDER — METOCLOPRAMIDE HCL 10 MG/10ML PO SOLN
10.0000 mg | Freq: Three times a day (TID) | ORAL | Status: DC
  Administered 2020-10-19 – 2020-10-21 (×3): 10 mg via ORAL
  Filled 2020-10-19 (×8): qty 10

## 2020-10-19 MED ORDER — ONDANSETRON HCL 4 MG/2ML IJ SOLN
4.0000 mg | Freq: Once | INTRAMUSCULAR | Status: AC
  Administered 2020-10-19: 4 mg via INTRAVENOUS
  Filled 2020-10-19: qty 2

## 2020-10-19 MED ORDER — RESOURCE INSTANT PROTEIN PO PWD PACKET
1.0000 | Freq: Three times a day (TID) | ORAL | Status: DC
  Administered 2020-10-19 – 2020-10-21 (×6): 6 g via ORAL
  Filled 2020-10-19 (×7): qty 6

## 2020-10-19 NOTE — Progress Notes (Signed)
PROGRESS NOTE   Bryce Mendoza  VVO:[id number] DOB: 08-Dec-1960 DOA: 10/17/2020 PCP: Claiborne Rigg, NP  Brief Narrative:   5Y male community dwelling DVT 2019 + PE Prior Lung Emypema, severe polyneuropathy unclear etiology pancreatitis admitted to the hospital 7/18 through 10/09/2020 developed necrotizing pancreatitis prolonged ABX difficulty tolerating diet and was managed on discharge with limited opiates at that hospitalization He was also diagnosed with DM TY 2 with A1c of 9.4 and he was sent home on glipizide and metformin twice daily as he was reluctant to use   3 to 4 days history in the outpatient setting of nausea vomiting when trying to graduate his diet culminating in admission again 8/17 with starvation ketosis  Hospital-Problem based course  Starvation ketosis Cont fluids for now Pancreatitis necrotizing with pancreatic pseudocyst High risk for worsening therefore will allow for soft diet but we will asked that the patient go slow  Nutritionist involved to help him get adequate nutrition-supplements Uncontrolled diabetes mellitus benefit from initiation of insulin--he is willing to learn to use Sugars are between 170 and 200 Add LA 6u  Prior lung decortication for empyema   DVT prophylaxis: Eliquis Code Status: Full Family Communication: None Disposition:  Status is: Inpatient  Remains inpatient appropriate because:Hemodynamically unstable, Altered mental status, Ongoing diagnostic testing needed not appropriate for outpatient work up, and IV treatments appropriate due to intensity of illness or inability to take PO  Dispo: The patient is from: Home              Anticipated d/c is to: Home              Patient currently is not medically stable to d/c.   Difficult to place patient No  Consultants:  n  Procedures: n  Antimicrobials: n    Subjective:  Doing fair-some n when grad diet, no CP/no fever Pain is controlled to some degree  Objective: Vitals:    10/18/20 1359 10/18/20 2031 10/19/20 0515 10/19/20 1418  BP: 130/89 118/89 (!) 132/92 (!) 134/95  Pulse: 74 73 74 65  Resp: 17 18 18 16   Temp: 97.8 F (36.6 C) 97.9 F (36.6 C) 97.9 F (36.6 C) 98.4 F (36.9 C)  TempSrc: Oral Oral Oral Oral  SpO2: 97% 98% 98% 97%  Weight:      Height:        Intake/Output Summary (Last 24 hours) at 10/19/2020 1634 Last data filed at 10/19/2020 1510 Gross per 24 hour  Intake 2393.07 ml  Output 1550 ml  Net 843.07 ml    Filed Weights   10/18/20 1317  Weight: 129.6 kg    Examination:  Mallampati 4 no distress EOMI NCAT no focal deficit Cta b no added sound  Abd soft nt nd no rebound no guard Rom intact no focal defciit Neuro intact  Data Reviewed: personally reviewed   CBC    Component Value Date/Time   WBC 10.9 (H) 10/17/2020 1122   RBC 4.33 10/17/2020 1122   HGB 14.7 10/17/2020 1122   HGB 14.8 07/12/2020 1341   HCT 43.9 10/17/2020 1122   HCT 40.9 07/12/2020 1341   PLT 323 10/17/2020 1122   PLT 206 07/12/2020 1341   MCV 101.4 (H) 10/17/2020 1122   MCV 110 (H) 07/12/2020 1341   MCH 33.9 10/17/2020 1122   MCHC 33.5 10/17/2020 1122   RDW 13.2 10/17/2020 1122   RDW 14.4 07/12/2020 1341   LYMPHSABS 2.5 10/17/2020 1122   MONOABS 0.6 10/17/2020 1122   EOSABS 0.2  10/17/2020 1122   BASOSABS 0.1 10/17/2020 1122   CMP Latest Ref Rng & Units 10/19/2020 10/18/2020 10/18/2020  Glucose 70 - 99 mg/dL 938(H) 829(H) 371(I)  BUN 6 - 20 mg/dL 5(L) 8 8  Creatinine 9.67 - 1.24 mg/dL 8.93(Y) 1.01 7.51  Sodium 135 - 145 mmol/L 136 138 138  Potassium 3.5 - 5.1 mmol/L 3.9 3.9 3.9  Chloride 98 - 111 mmol/L 108 110 107  CO2 22 - 32 mmol/L 20(L) 21(L) 24  Calcium 8.9 - 10.3 mg/dL 0.2(H) 8.5(I) 7.7(O)  Total Protein 6.5 - 8.1 g/dL - - -  Total Bilirubin 0.3 - 1.2 mg/dL - - -  Alkaline Phos 38 - 126 U/L - - -  AST 15 - 41 U/L - - -  ALT 0 - 44 U/L - - -     Radiology Studies: No results found.   Scheduled Meds:  apixaban  5 mg Oral BID    dicyclomine  10 mg Oral TID AC   insulin aspart  0-15 Units Subcutaneous TID WC   insulin aspart  0-5 Units Subcutaneous QHS   lipase/protease/amylase  12,000 Units Oral TID AC   metoCLOPramide  10 mg Oral TID AC   multivitamin with minerals  1 tablet Oral Daily   nortriptyline  10 mg Oral QHS   pantoprazole  40 mg Oral Daily   [START ON 10/20/2020] Ensure Max Protein  11 oz Oral BID   protein supplement  1 Scoop Oral TID WC   sucralfate  1 g Oral TID WC & HS   Continuous Infusions:  sodium chloride 75 mL/hr at 10/19/20 1626     LOS: 2 days   Time spent: 24  Rhetta Mura, MD [facility] To contact the attending provider between 7A-7P or the covering provider during after hours 7P-7A, please log into the web site [email or web address] and access using universal [place] password for that web site. If you do not have the password, please call the hospital operator.  10/19/2020, 4:34 PM

## 2020-10-19 NOTE — Plan of Care (Signed)
  Problem: Pain Managment: Goal: General experience of comfort will improve Outcome: Progressing   Problem: Safety: Goal: Ability to remain free from injury will improve Outcome: Progressing   Problem: Education: Goal: Knowledge of Pancreatitis treatment and prevention will improve Outcome: Progressing

## 2020-10-19 NOTE — Progress Notes (Signed)
Inpatient Diabetes Program Recommendations  AACE/ADA: New Consensus Statement on Inpatient Glycemic Control (2015)  Target Ranges:  Prepandial:   less than 140 mg/dL      Peak postprandial:   less than 180 mg/dL (1-2 hours)      Critically ill patients:  140 - 180 mg/dL   Lab Results  Component Value Date   GLUCAP 219 (H) 10/19/2020   HGBA1C 9.4 (H) 09/18/2020    Review of Glycemic Control  Diabetes history:  Current orders for Inpatient glycemic control: Novolog 0-15 units TID with meals and 0-5 HS  Needs basal insulin  CBGs this am208, 219 mg/dL   Inpatient Diabetes Program Recommendations:    Add Levemir 6 units QD.  Follow.  Thank you. Ailene Ards, RD, LDN, CDE Inpatient Diabetes Coordinator [phone]

## 2020-10-19 NOTE — Progress Notes (Signed)
Initial Nutrition Assessment  DOCUMENTATION CODES:   Obesity unspecified  INTERVENTION:   -Beneprotein powder TID with meals, each provides 25 kcals and 6g protein  -Ensure MAX Protein po BID, each supplement provides 150 kcal and 30 grams of protein   -Provided handouts on carbohydrate counting and pancreatitis diet   NUTRITION DIAGNOSIS:   Increased nutrient needs related to chronic illness (necrotizing pancreatitis) as evidenced by estimated needs.  GOAL:   Patient will meet greater than or equal to 90% of their needs  MONITOR:   PO intake, Supplement acceptance, Labs, Weight trends, I & O's  REASON FOR ASSESSMENT:   Malnutrition Screening Tool    ASSESSMENT:   81Y male  DVT 2019 + PE Prior Lung Emypema, severe polyneuropathy unclear etiology     Known pancreatitis admitted to the hospital 7/18 through 10/09/2020 developed necrotizing pancreatitis prolonged ABX difficulty tolerating diet and was managed on discharge with limited opiates at that hospitalization  He was also diagnosed with DM TY 2 with A1c of 9.4 and he was sent home on glipizide and metformin twice daily as he was reluctant to use     3 to 4 days history in the outpatient setting of nausea vomiting when trying to graduate his diet culminating in admission again 8/17 with starvation ketosis  Pt in room, states he is not feeling too great today. States he had episodes of vomiting this morning. So he is taking it slow with his diet and going to try some pudding in a little while.  Pt was diagnosed with starvation ketosis. Has not been tolerating much PO since July 2022. Was previously admitted for acute pancreatitis. Was provided diabetes diet education during that admission.    Was having N/V and diarrhea for 3 days PTA.  Requests further diet information on carbohydrate counting and diet for pancreatitis. Will provide in d/c instructions. For now pt wants to see if he can tolerate PO and RD will add protein  supplements as tolerated.  Per weight records, pt has lost 35 lbs since 7/27 (10% wt loss x 3 weeks, significant for time frame).  Medications: Creon, Reglan, Carafate  Labs reviewed: CBGs:  115-219  NUTRITION - FOCUSED PHYSICAL EXAM:  No depletions noted.  Diet Order:   Diet Order             Diet Carb Modified Fluid consistency: Thin; Room service appropriate? Yes  Diet effective now                   EDUCATION NEEDS:   Education needs have been addressed  Skin:  Skin Assessment: Reviewed RN Assessment  Last BM:  8/18  Height:   Ht Readings from Last 1 Encounters:  10/18/20 6\' 1"  (1.854 m)    Weight:   Wt Readings from Last 1 Encounters:  10/18/20 129.6 kg    BMI:  Body mass index is 37.7 kg/m.  Estimated Nutritional Needs:   Kcal:  2300-2500  Protein:  115-125g  Fluid:  2.3L/day   10/20/20, MS, RD, LDN Inpatient Clinical Dietitian Contact information available via Amion

## 2020-10-19 NOTE — Discharge Instructions (Signed)

## 2020-10-19 NOTE — Plan of Care (Signed)
  Problem: Activity: Goal: Risk for activity intolerance will decrease Outcome: Progressing   Problem: Pain Managment: Goal: General experience of comfort will improve Outcome: Progressing   Problem: Safety: Goal: Ability to remain free from injury will improve Outcome: Progressing   

## 2020-10-20 LAB — BASIC METABOLIC PANEL
Anion gap: 6 (ref 5–15)
BUN: <5 mg/dL — ABNORMAL LOW (ref 6–20)
CO2: 23 mmol/L (ref 22–32)
Calcium: 8.3 mg/dL — ABNORMAL LOW (ref 8.9–10.3)
Chloride: 107 mmol/L (ref 98–111)
Creatinine, Ser: 0.61 mg/dL (ref 0.61–1.24)
GFR, Estimated: >60 mL/min (ref >60)
Glucose, Bld: 203 mg/dL — ABNORMAL HIGH (ref 70–99)
Potassium: 3.6 mmol/L (ref 3.5–5.1)
Sodium: 136 mmol/L (ref 135–145)

## 2020-10-20 LAB — GLUCOSE, CAPILLARY
Glucose-Capillary: 240 mg/dL — ABNORMAL HIGH (ref 70–99)
Glucose-Capillary: 177 mg/dL — ABNORMAL HIGH (ref 70–99)
Glucose-Capillary: 184 mg/dL — ABNORMAL HIGH (ref 70–99)
Glucose-Capillary: 188 mg/dL — ABNORMAL HIGH (ref 70–99)

## 2020-10-20 MED ORDER — METFORMIN HCL ER 500 MG PO TB24
500.0000 mg | ORAL_TABLET | Freq: Two times a day (BID) | ORAL | Status: DC
Start: 2020-10-20 — End: 2020-10-21
  Administered 2020-10-20 – 2020-10-21 (×2): 500 mg via ORAL
  Filled 2020-10-20 (×2): qty 1

## 2020-10-20 MED ORDER — GLIPIZIDE 5 MG PO TABS
5.0000 mg | ORAL_TABLET | Freq: Every day | ORAL | Status: DC
  Administered 2020-10-21: 5 mg via ORAL
  Filled 2020-10-20: qty 1

## 2020-10-20 MED ORDER — ONDANSETRON HCL 4 MG/2ML IJ SOLN
4.0000 mg | Freq: Three times a day (TID) | INTRAMUSCULAR | Status: DC | PRN
  Administered 2020-10-20: 4 mg via INTRAVENOUS
  Filled 2020-10-20: qty 2

## 2020-10-20 MED ORDER — METHOCARBAMOL 500 MG PO TABS
500.0000 mg | ORAL_TABLET | Freq: Three times a day (TID) | ORAL | Status: DC
  Administered 2020-10-20 – 2020-10-21 (×3): 500 mg via ORAL
  Filled 2020-10-20 (×3): qty 1

## 2020-10-20 NOTE — Plan of Care (Signed)
  Problem: Health Behavior/Discharge Planning: Goal: Ability to manage health-related needs will improve Outcome: Progressing   

## 2020-10-20 NOTE — Progress Notes (Signed)
PROGRESS NOTE   Bryce Mendoza  EQA:[id number] DOB: [birth year] DOA: 10/17/2020 PCP: Claiborne Rigg, NP  Brief Narrative:   40Y male community dwelling DVT 2019 + PE Prior Lung Emypema, severe polyneuropathy unclear etiology pancreatitis admitted to the hospital 7/18 through 10/09/2020 developed necrotizing pancreatitis prolonged ABX difficulty tolerating diet and was managed on discharge with limited opiates at that hospitalization He was also diagnosed with DM TY 2 with A1c of 9.4 and he was sent home on glipizide and metformin twice daily as he was reluctant to use   3 to 4 days history in the outpatient setting of nausea vomiting when trying to graduate his diet culminating in admission again 8/17 with starvation ketosis  Hospital-Problem based course  Starvation ketosis Ketosis resolved fluids saline locked as below See below Pancreatitis necrotizing with pancreatic pseudocyst Tolerating soft diet but we will asked that the patient go slow  Robaxin 500 3 times daily ordered--try to avoid opiates if possible Saline lock fluids 8/20 and if tolerating p.o. can discharge in a.m. Nutritionist involved to help him get adequate nutrition-supplements Uncontrolled diabetes mellitus Resume home meds glipizide 5, metformin 500 XL a.m.  Sugars 1 56-2 40 Add LA 6u and will need insulin teaching prior to discharge Prior lung decortication for empyema   DVT prophylaxis: Eliquis Code Status: Full Family Communication: None Disposition:  Status is: Inpatient  Remains inpatient appropriate because:Hemodynamically unstable, Altered mental status, Ongoing diagnostic testing needed not appropriate for outpatient work up, and IV treatments appropriate due to intensity of illness or inability to take PO  Dispo: The patient is from: Home              Anticipated d/c is to: Home              Patient currently is not medically stable to d/c.   Difficult to place patient No  Consultants:   n  Procedures: n  Antimicrobials: n    Subjective:  Eating 50% meal no nausea no vomiting Long discussion about pain meds-we have agreed to trial Robaxin first choice and only use opiates second choice Tells me his nurse practitioner cannot order pain meds?? No chest pain no fever  Objective: Vitals:   10/19/20 1418 10/19/20 2103 10/20/20 0229 10/20/20 0606  BP: (!) 134/95 130/89 130/80 133/84  Pulse: 65 68 69 80  Resp: 16 18 16 16   Temp: 98.4 F (36.9 C) 98.1 F (36.7 C) 98.8 F (37.1 C) 98 F (36.7 C)  TempSrc: Oral Oral Oral Oral  SpO2: 97% 98% 95% 97%  Weight:      Height:        Intake/Output Summary (Last 24 hours) at 10/20/2020 1136 Last data filed at 10/20/2020 0743 Gross per 24 hour  Intake 1795.83 ml  Output 2100 ml  Net -304.17 ml    Filed Weights   10/18/20 1317  Weight: 129.6 kg    Examination:  Mallampati 4 no distress  EOMI NCAT no focal deficit Chest clear Abd soft with mild epigastric tenderness Rom intact Neuro intact Mild lower extremity edema  Data Reviewed: personally reviewed   CBC    Component Value Date/Time   WBC 10.9 (H) 10/17/2020 1122   RBC 4.33 10/17/2020 1122   HGB 14.7 10/17/2020 1122   HGB 14.8 07/12/2020 1341   HCT 43.9 10/17/2020 1122   HCT 40.9 07/12/2020 1341   PLT 323 10/17/2020 1122   PLT 206 07/12/2020 1341   MCV 101.4 (H) 10/17/2020 1122   MCV  110 (H) 07/12/2020 1341   MCH 33.9 10/17/2020 1122   MCHC 33.5 10/17/2020 1122   RDW 13.2 10/17/2020 1122   RDW 14.4 07/12/2020 1341   LYMPHSABS 2.5 10/17/2020 1122   MONOABS 0.6 10/17/2020 1122   EOSABS 0.2 10/17/2020 1122   BASOSABS 0.1 10/17/2020 1122   CMP Latest Ref Rng & Units 10/20/2020 10/19/2020 10/18/2020  Glucose 70 - 99 mg/dL 993(T) 701(X) 793(J)  BUN 6 - 20 mg/dL <0(Z) 5(L) 8  Creatinine 0.61 - 1.24 mg/dL 0.09 2.33(A) 0.76  Sodium 135 - 145 mmol/L 136 136 138  Potassium 3.5 - 5.1 mmol/L 3.6 3.9 3.9  Chloride 98 - 111 mmol/L 107 108 110  CO2 22  - 32 mmol/L 23 20(L) 21(L)  Calcium 8.9 - 10.3 mg/dL 8.3(L) 8.5(L) 8.6(L)  Total Protein 6.5 - 8.1 g/dL - - -  Total Bilirubin 0.3 - 1.2 mg/dL - - -  Alkaline Phos 38 - 126 U/L - - -  AST 15 - 41 U/L - - -  ALT 0 - 44 U/L - - -     Radiology Studies: No results found.   Scheduled Meds:  apixaban  5 mg Oral BID   dicyclomine  10 mg Oral TID AC   insulin aspart  0-15 Units Subcutaneous TID WC   insulin aspart  0-5 Units Subcutaneous QHS   insulin glargine-yfgn  6 Units Subcutaneous Daily   lipase/protease/amylase  12,000 Units Oral TID AC   methocarbamol  500 mg Oral TID   metoCLOPramide  10 mg Oral TID AC   multivitamin with minerals  1 tablet Oral Daily   nortriptyline  10 mg Oral QHS   pantoprazole  40 mg Oral Daily   Ensure Max Protein  11 oz Oral BID   protein supplement  1 Scoop Oral TID WC   sucralfate  1 g Oral TID WC & HS   Continuous Infusions:     LOS: 3 days   Time spent: 16  Rhetta Mura, MD [facility] To contact the attending provider between 7A-7P or the covering provider during after hours 7P-7A, please log into the web site [email or web address] and access using universal [place] password for that web site. If you do not have the password, please call the hospital operator.  10/20/2020, 11:36 AM

## 2020-10-21 ENCOUNTER — Other Ambulatory Visit: Payer: Self-pay | Admitting: Nurse Practitioner

## 2020-10-21 DIAGNOSIS — E872 Acidosis: Secondary | ICD-10-CM

## 2020-10-21 DIAGNOSIS — F5101 Primary insomnia: Secondary | ICD-10-CM

## 2020-10-21 LAB — GLUCOSE, CAPILLARY
Glucose-Capillary: 205 mg/dL — ABNORMAL HIGH (ref 70–99)
Glucose-Capillary: 115 mg/dL — ABNORMAL HIGH (ref 70–99)

## 2020-10-21 LAB — BASIC METABOLIC PANEL
Anion gap: 8 (ref 5–15)
BUN: 6 mg/dL (ref 6–20)
CO2: 22 mmol/L (ref 22–32)
Calcium: 8.8 mg/dL — ABNORMAL LOW (ref 8.9–10.3)
Chloride: 107 mmol/L (ref 98–111)
Creatinine, Ser: 0.78 mg/dL (ref 0.61–1.24)
GFR, Estimated: >60 mL/min (ref >60)
Glucose, Bld: 206 mg/dL — ABNORMAL HIGH (ref 70–99)
Potassium: 3.6 mmol/L (ref 3.5–5.1)
Sodium: 137 mmol/L (ref 135–145)

## 2020-10-21 MED ORDER — LORAZEPAM 1 MG PO TABS: 1.0000 mg | ORAL_TABLET | Freq: Two times a day (BID) | ORAL | 0 refills | Status: DC | PRN

## 2020-10-21 MED ORDER — ENSURE MAX PROTEIN PO LIQD: 11.0000 [oz_av] | Freq: Two times a day (BID) | ORAL | Status: DC

## 2020-10-21 MED ORDER — HYDROXYZINE PAMOATE 25 MG PO CAPS: 25.0000 mg | ORAL_CAPSULE | Freq: Three times a day (TID) | ORAL | 0 refills | Status: DC | PRN

## 2020-10-21 MED ORDER — METOCLOPRAMIDE HCL 10 MG/10ML PO SOLN: 10.0000 mg | Freq: Three times a day (TID) | ORAL | 0 refills | Status: DC

## 2020-10-21 MED ORDER — METHOCARBAMOL 500 MG PO TABS: 500.0000 mg | ORAL_TABLET | Freq: Three times a day (TID) | ORAL | 0 refills | Status: DC

## 2020-10-21 MED ORDER — TRAZODONE HCL 50 MG PO TABS: 50.0000 mg | ORAL_TABLET | Freq: Every day | ORAL | 0 refills | Status: DC

## 2020-10-21 MED ORDER — OXYCODONE HCL 5 MG PO TABS: 5.0000 mg | ORAL_TABLET | ORAL | 0 refills | Status: AC | PRN

## 2020-10-21 MED ORDER — SUCRALFATE 1 G PO TABS: 1.0000 g | ORAL_TABLET | Freq: Three times a day (TID) | ORAL | 0 refills | Status: DC

## 2020-10-21 NOTE — Plan of Care (Signed)
Pt alert and oriented x 4. Med compliant. PRN oxy given x 2 at this time. Pt ambulating in hall at shift change 1900 with walker. Vitals stable. No compliant's.  Problem: Health Behavior/Discharge Planning: Goal: Ability to manage health-related needs will improve Outcome: Progressing   Problem: Clinical Measurements: Goal: Ability to maintain clinical measurements within normal limits will improve Outcome: Progressing Goal: Will remain free from infection Outcome: Progressing Goal: Diagnostic test results will improve Outcome: Progressing Goal: Respiratory complications will improve Outcome: Progressing Goal: Cardiovascular complication will be avoided Outcome: Progressing   Problem: Activity: Goal: Risk for activity intolerance will decrease Outcome: Progressing   Problem: Nutrition: Goal: Adequate nutrition will be maintained Outcome: Progressing   Problem: Elimination: Goal: Will not experience complications related to bowel motility Outcome: Progressing Goal: Will not experience complications related to urinary retention Outcome: Progressing   Problem: Pain Managment: Goal: General experience of comfort will improve Outcome: Progressing   Problem: Safety: Goal: Ability to remain free from injury will improve Outcome: Progressing   Problem: Skin Integrity: Goal: Risk for impaired skin integrity will decrease Outcome: Progressing   Problem: Education: Goal: Knowledge of Pancreatitis treatment and prevention will improve Outcome: Progressing   Problem: Health Behavior/Discharge Planning: Goal: Ability to formulate a plan to maintain an alcohol-free life will improve Outcome: Progressing   Problem: Nutritional: Goal: Ability to achieve adequate nutritional intake will improve Outcome: Progressing   Problem: Clinical Measurements: Goal: Complications related to the disease process, condition or treatment will be avoided or minimized Outcome: Progressing

## 2020-10-21 NOTE — Discharge Summary (Signed)
Physician Discharge Summary  Gurjot Brisco CWU:[id number] DOB: 1960/05/02 DOA: 10/17/2020  PCP: [name], NP  Admit date: 10/17/2020 Discharge date: 10/21/2020  Time spent: 40 minutes  Recommendations for Outpatient Follow-up:  Needs Chem-12, lipase CBC in 1 week as well as close follow-up with [facility] gastroenterology On discharge I discussed the case with Dr. Rosalie Gums who is aware of the patient and will coordinate with patient's GI doctor outlined the outpatient setting for close follow-up On discharge prescribed Ativan, pain meds Limited prescription and this will need to be refilled at discretion of PCP/NP Will need further discussion in the outpatient setting regarding diabetic control-we will try to ensure that the patient has a continuous glucometer set up-in the outpatient setting may require insulin however need to ensure that patient can see clearly to this up and give it appropriately  Discharge Diagnoses:  MAIN problem for hospitalization starvation ketosis  Please see below for itemized issues addressed in [place]- refer to other progress notes for clarity if needed  Discharge Condition: Improved  Diet recommendation: Diabetic  Filed Weights   10/18/20 1317  Weight: 129.6 kg    History of present illness:  28Y male community dwelling DVT 2019 + PE Prior Lung Emypema, severe polyneuropathy unclear etiology pancreatitis admitted to the hospital 7/18 through 10/09/2020 developed necrotizing pancreatitis prolonged ABX difficulty tolerating diet and was managed on discharge with limited opiates at that hospitalization He was also diagnosed with DM TY 2 with A1c of 9.4 and he was sent home on glipizide and metformin twice daily as he was reluctant to use   3 to 4 days history in the outpatient setting of nausea vomiting when trying to graduate his diet culminating in admission again 8/17 with starvation ketosis  Hospital Course:  Starvation ketosis Ketosis resolved  fluids saline locked as below See below Pancreatitis necrotizing with pancreatic pseudocyst IgG/ANA last admission were negative ruling out autoimmune pancreatitis Tolerating soft diet but we will asked that the patient go slow  Robaxin 500 3 times daily and prescribed for the patient on discharge-Limited prescription of Robaxin also given for pain in addition to Percocet PDMP was reviewed on 8/21 on discharge and patient had appropriate dosing of meds and will need outpatient pain reevaluation by PCP and meds as needed Saline lock fluids 8/20 tolerating diet on discharge eating 100% of meals moderately controlled Uncontrolled diabetes mellitus Resume home meds glipizide 5, metformin 500 XL a.m.  Sugars moderately controlled Discussion of long-acting insulin was entertained however patient does not have good vision secondary to cataracts and cannot dial up the insulin-I have reached out to [facility] to ensure that we can make sure he has adequate equipment and may be a continuous glucometer In the outpatient setting he may require insulin if this can be accomplished safely Prior lung decortication for empyema   Discharge Exam: Vitals:   10/20/20 2053 10/21/20 0529  BP: 139/90 (!) 142/94  Pulse: 84 78  Resp: 18 17  Temp: 98.7 F (37.1 C) 98.3 F (36.8 C)  SpO2: 98% 98%    Subj on day of d/c   Awake coherent no distress Eating 100% of meals  General Exam on discharge  EOMI NCAT no focal deficit S1-S2 no murmur CTA B no rales rhonchi Abdomen soft no rebound no guarding overall improved Mild lower extremity edema Chest clear no added sound ROM intact moving all 4 limbs Is ambulated 2 times around the unit  Discharge Instructions   Discharge Instructions  Diet - low sodium heart healthy   Complete by: As directed    Diet Carb Modified   Complete by: As directed    Discharge instructions   Complete by: As directed    You were prescribed this admission limited amount of  opiates and you will need close follow-up with either your primary physician who should be able to order this and or another healthcare provider such as a gastroenterologist I will reach out to gastroenterology to make sure that there is a follow-up for your pseudocyst I have also prescribed for you this admission something to help with your gotten motion which is called Reglan in addition you will need Carafate which will help coat your belly and allow him to eat properly Ideally you should be on low-dose insulin however because of your visual issues and difficulty dialing up the insulin, I would continue metformin and your glipizide at this juncture to ensure that you have some blood sugar control I will reach out to case management to see if he can get a continuous glucose monitor which should be helpful in terms of making sure that your sugars are reasonably controlled Please continue to eat a soft diet for the next several days to weeks until gastroenterology can reach back out to I would strongly recommend that if you have further symptoms you call GI office directly and they will be intimated to your issues as we discussed Please pick up your medication from pharmacy and note that your primary physician once again is in chart ascertaining whether you need refills or not   Increase activity slowly   Complete by: As directed       Allergies as of 10/21/2020   No Known Allergies      Medication List     STOP taking these medications    acetaminophen 500 MG tablet Commonly known as: TYLENOL   acetaminophen-codeine 300-30 MG tablet Commonly known as: TYLENOL #3   diphenhydrAMINE 25 mg capsule Commonly known as: BENADRYL       TAKE these medications    Accu-Chek Guide test strip Generic drug: glucose blood Check blood sugar once daily.   atorvastatin 40 MG tablet Commonly known as: LIPITOR Take 1 tablet (40 mg total) by mouth daily.   blood glucose meter kit and supplies  Kit Dispense based on patient and insurance preference. Use up to four times daily as directed.   dicyclomine 10 MG capsule Commonly known as: BENTYL Take 1 capsule (10 mg total) by mouth 4 (four) times daily -  before meals and at bedtime for 7 doses.   Eliquis 5 MG Tabs tablet Generic drug: apixaban Take 1 tablet (5 mg total) by mouth 2 (two) times daily.   Ensure Max Protein Liqd Take 330 mLs (11 oz total) by mouth 2 (two) times daily.   glipiZIDE 5 MG tablet Commonly known as: GLUCOTROL Take 1 tablet (5 mg total) by mouth daily before breakfast.   hydrOXYzine 25 MG capsule Commonly known as: VISTARIL Take 1 capsule (25 mg total) by mouth every 8 (eight) hours as needed. What changed: reasons to take this   lipase/protease/amylase 12000-38000 units Cpep capsule Commonly known as: CREON Take 1 capsule (12,000 Units total) by mouth 3 (three) times daily before meals.   LORazepam 1 MG tablet Commonly known as: ATIVAN Take 1 tablet (1 mg total) by mouth every 12 (twelve) hours as needed for anxiety.   metFORMIN 500 MG 24 hr tablet Commonly known as: Glucophage XR  Take 1 tablet (500 mg total) by mouth 2 (two) times daily.   methocarbamol 500 MG tablet Commonly known as: ROBAXIN Take 1 tablet (500 mg total) by mouth 3 (three) times daily.   metoCLOPramide 10 MG/10ML Soln Commonly known as: REGLAN Take 10 mLs (10 mg total) by mouth 3 (three) times daily before meals.   nortriptyline 10 MG capsule Commonly known as: PAMELOR TAKE 1 CAPSULE BY MOUTH DAILY and  TAKE 2 capsules by mouth AT BEDTIME FOR NEUROPATHY What changed:  how much to take how to take this when to take this additional instructions   oxyCODONE 5 MG immediate release tablet Commonly known as: Oxy IR/ROXICODONE Take 1 tablet (5 mg total) by mouth every 4 (four) hours as needed for up to 3 days for moderate pain. What changed:  medication strength how much to take reasons to take this   pantoprazole  40 MG tablet Commonly known as: PROTONIX Take 1 tablet (40 mg total) by mouth daily.   sucralfate 1 g tablet Commonly known as: CARAFATE Take 1 tablet (1 g total) by mouth 4 (four) times daily -  with meals and at bedtime.   traZODone 50 MG tablet Commonly known as: DESYREL Take 1-2 tablets (50-100 mg total) by mouth at bedtime as needed for sleep. What changed: Another medication with the same name was added. Make sure you understand how and when to take each.   traZODone 50 MG tablet Commonly known as: DESYREL Take 1 tablet (50 mg total) by mouth at bedtime. What changed: You were already taking a medication with the same name, and this prescription was added. Make sure you understand how and when to take each.   zolpidem 5 MG tablet Commonly known as: AMBIEN Take 5 mg by mouth at bedtime as needed for sleep. # 5 DS  completed on 10-14-20       No Known Allergies    The results of significant diagnostics from this hospitalization (including imaging, microbiology, ancillary and laboratory) are listed below for reference.    Significant Diagnostic Studies: DG Abd 1 View  Result Date: 09/28/2020 CLINICAL DATA:  Abdominal pain EXAM: ABDOMEN - 1 VIEW COMPARISON:  09/25/2020 FINDINGS: Diffusely gas-filled and distended small bowel and colon throughout, not not significantly changed. Largest loops of transverse colon measure up to 10.6 cm in caliber. No free air in the abdomen on supine radiographs. IMPRESSION: Diffusely gas-filled and distended small bowel and colon throughout, not significantly changed. Largest loops of transverse colon measure up to 10.6 cm in caliber. Findings remain most consistent with ileus. No free air in the abdomen on supine radiographs. Electronically Signed   By: Eddie Candle M.D.   On: 09/28/2020 11:18   CT Abdomen Pelvis W Contrast  Result Date: 10/17/2020 CLINICAL DATA:  Diffuse abdominal pain.  History of pancreatitis EXAM: CT ABDOMEN AND PELVIS WITH  CONTRAST TECHNIQUE: Multidetector CT imaging of the abdomen and pelvis was performed using the standard protocol following bolus administration of intravenous contrast. CONTRAST:  53m OMNIPAQUE IOHEXOL 350 MG/ML SOLN COMPARISON:  10/05/2020 FINDINGS: Lower chest: Bibasilar atelectasis.  Heart size is normal. Hepatobiliary: Diffusely decreased attenuation of the hepatic parenchyma. No focal liver lesion identified. Unremarkable gallbladder. No hyperdense gallstone. No intrahepatic biliary dilatation. Pancreas: Inflammatory stranding with a small amount of free fluid adjacent to the pancreatic body and tail. Similar appearance of hypoenhancement of the distal pancreatic body and tail with surrounding fluid. Partial peripheral enhancement of fluid surrounding the pancreatic body and tail,  measuring approximately 13.6 x 4.5 x 4.7 cm. Fluid collection along the anterior aspect of the pancreatic head measures approximately 5.0 x 2.8 x 3.9 cm (previously measured approximately 6.2 x 3.2 x 4.4 cm on 10/05/2020). Spleen: Normal in size without focal abnormality. Adrenals/Urinary Tract: Unremarkable adrenal glands. Kidneys enhance symmetrically without focal lesion, stone, or hydronephrosis. Ureters are nondilated. Urinary bladder appears unremarkable for the degree of distension. Stomach/Bowel: Stomach is within normal limits. Appendix not visualized. No evidence of bowel wall thickening, distention, or inflammatory changes. Vascular/Lymphatic: Scattered aortoiliac atherosclerotic calcifications without aneurysm. Multiple mildly prominent upper abdominal lymph nodes are stable from prior and likely reactive. No intrapelvic adenopathy. Reproductive: Prostate is unremarkable. Other: No pneumoperitoneum.  Small fat containing umbilical hernia. Musculoskeletal: No acute or significant osseous findings. Degenerative disc disease of L5-S1. IMPRESSION: 1. Continued evolution of acute necrotizing pancreatitis with similar  appearance of hypoenhancement of the distal pancreatic body and tail with surrounding fluid. Progressive partial peripheral enhancement of fluid surrounding the pancreatic body and tail suggestive of developing pseudocyst. 2. Slight interval decrease in size of pseudocyst along the anterior aspect of the pancreatic head measuring up to 5.0 cm (previously 6.2 cm on 10/05/2020). 3. Hepatic steatosis. 4. Aortic atherosclerosis (ICD10-I70.0). Electronically Signed   By: Davina Poke D.O.   On: 10/17/2020 15:53   CT ABDOMEN PELVIS W CONTRAST  Result Date: 10/05/2020 CLINICAL DATA:  Pancreatitis, persistent abdominal pain and abdominal distension EXAM: CT ABDOMEN AND PELVIS WITH CONTRAST TECHNIQUE: Multidetector CT imaging of the abdomen and pelvis was performed using the standard protocol following bolus administration of intravenous contrast. CONTRAST:  91m OMNIPAQUE IOHEXOL 350 MG/ML SOLN COMPARISON:  09/25/2020 FINDINGS: Lower chest: No acute abnormality. Trace left pleural effusion associated atelectasis or consolidation, unchanged. Coronary artery calcifications. Hepatobiliary: No solid liver abnormality is seen. Hepatic steatosis. No gallstones, gallbladder wall thickening, or biliary dilatation. Pancreas: Redemonstrated diffuse inflammatory fat stranding and fluid about the pancreas. There is a new fluid collection anterior to the pancreatic head and uncinate measuring 4.4 x 3.3 cm (series 2, image 41). There is redemonstrated hypoenhancement of the distal pancreatic body and tail (series 2, image 33) with surrounding fluid. Spleen: Normal in size without significant abnormality. Adrenals/Urinary Tract: Adrenal glands are unremarkable. Kidneys are normal, without renal calculi, solid lesion, or hydronephrosis. Bladder is unremarkable. Stomach/Bowel: Stomach is within normal limits. Appendix is not clearly visualized and may be surgically absent. No evidence of bowel wall thickening, distention, or  inflammatory changes. Vascular/Lymphatic: Aortic atherosclerosis. No enlarged abdominal or pelvic lymph nodes. Reproductive: No mass or other significant abnormality. Other: No abdominal wall hernia or abnormality. No abdominopelvic ascites. Musculoskeletal: No acute or significant osseous findings. IMPRESSION: 1. Redemonstrated diffuse inflammatory fat stranding and fluid about the pancreas, consistent with acute pancreatitis. 2. There is a new fluid collection anterior to the pancreatic head and uncinate measuring 4.4 x 3.3 cm, consistent with acute pancreatic fluid collection. 3. There is redemonstrated hypoenhancement of the distal pancreatic body and tail with surrounding fluid, findings consistent with pancreatic parenchymal necrosis, which is similar in appearance to prior examination. 4. Hepatic steatosis. 5. Trace left pleural effusion associated atelectasis or consolidation, unchanged. 6. Coronary artery disease. Aortic Atherosclerosis (ICD10-I70.0). Electronically Signed   By: AEddie CandleM.D.   On: 10/05/2020 14:49   CT ABDOMEN PELVIS W CONTRAST  Result Date: 09/25/2020 CLINICAL DATA:  Follow-up severe acute pancreatitis. EXAM: CT ABDOMEN AND PELVIS WITH CONTRAST TECHNIQUE: Multidetector CT imaging of the abdomen and pelvis was performed using the  standard protocol following bolus administration of intravenous contrast. CONTRAST:  69m OMNIPAQUE IOHEXOL 350 MG/ML SOLN COMPARISON:  September 17, 2020 FINDINGS: Lower chest: Small LEFT effusion. Basilar atelectasis. Effusion as developed since the prior study. Hepatobiliary: No focal, suspicious hepatic lesion. The portal vein is patent. SMV is patent. Splenic vein is diminutive but patent. Small amount of perihepatic ascites. No pericholecystic stranding or gross biliary duct distension. Pancreas: Severe peripancreatic stranding. Subjective diminished enhancement of the pancreatic tail relative to the head and neck. No focal fluid but with fluid in the  interstices of retroperitoneal fascial planes of the anterior pararenal space and transverse mesocolon. Density of pancreatic parenchyma in the talus 20 Hounsfield units and the head approximately 45 Hounsfield units. Spleen: Normal spleen. Adrenals/Urinary Tract: Adrenal glands are normal. Symmetric renal enhancement. No hydronephrosis. Smooth contour the urinary bladder. Stomach/Bowel: Global small bowel distension which is moderate and without discrete transition. Distension also of the colon also without transition point. Liquid stool and gas throughout the colon. Vascular/Lymphatic: Calcified atheromatous plaque of the abdominal aorta without aneurysmal dilation. Smooth contour of the IVC. Scattered lymph nodes throughout the retroperitoneum and upper abdomen favored to be reactive in the setting of severe acute pancreatitis. Reproductive: Unremarkable. Other: Small fat containing umbilical and bilateral inguinal hernias. No free air. Trace fluid in the pelvis. Most of the fluid seen in the abdomen is a fascial planes and about the pancreas again without focal collection. Body wall edema. This is mild to moderate and along the RIGHT and LEFT flank. Musculoskeletal: No acute musculoskeletal process. Spinal degenerative changes. IMPRESSION: 1. Severe acute interstitial edematous pancreatitis likely transitioning into necrotic pancreatitis with early glandular and peripancreatic necrosis suspected. Diminished density mainly within the tail and distal body of the pancreas on the current study. Close attention on follow-up. 2. Heterogeneous appearance of peripancreatic stranding without focal fluid collection. This stranding is increased since the prior study. 3. Minimal fluid in the abdomen and pelvis with trace ascites about the liver. 4. Signs of global ileus in the setting of pancreatitis. 5. Global small bowel distension which is moderate and without discrete transition point. Liquid stool and gas throughout  the colon. Findings may represent ileus. 6. Small LEFT effusion and basilar atelectasis. 7. Developing anasarca with LEFT effusion and bilateral flank edema. 8. Aortic atherosclerosis. Aortic Atherosclerosis (ICD10-I70.0). Electronically Signed   By: GZetta BillsM.D.   On: 09/25/2020 14:35   DG Abd 2 Views  Result Date: 09/25/2020 CLINICAL DATA:  60year old male with pancreatitis, abdominal pain and distension EXAM: ABDOMEN - 2 VIEW COMPARISON:  CT 09/17/2020, plain film 09/24/2020 FINDINGS: Air-fluid level within the stomach is unchanged. Similar pattern of dilated small bowel loops and colonic loops with multiple air-fluid levels. Relative paucity of rectal gas. No radiopaque foreign body.  No gastric tube identified. No displaced fracture IMPRESSION: Unchanged appearance of the abdomen plain film, with multiple air-fluid levels and gaseous distension of stomach, small bowel and colon compatible with ileus. Electronically Signed   By: JCorrie MckusickD.O.   On: 09/25/2020 10:36   DG Abd 2 Views  Result Date: 09/24/2020 CLINICAL DATA:  60year old with abdominal distension. EXAM: ABDOMEN - 2 VIEW COMPARISON:  CT abdomen and pelvis 09/17/2020 FINDINGS: Patchy densities at the left lung base are suggestive for atelectasis and small amount of pleural fluid. Dilated gas-filled loops of small and large bowel. No evidence for free air. IMPRESSION: 1. Gas-filled loops of small and large bowel. Findings are suggestive for an adynamic ileus. 2.  Left basilar densities are suggestive for small pleural effusion and atelectasis. Electronically Signed   By: Markus Daft M.D.   On: 09/24/2020 13:17    Microbiology: Recent Results (from the past 240 hour(s))  Resp Panel by RT-PCR (Flu A&B, Covid) Nasopharyngeal Swab     Status: None   Collection Time: 10/17/20  2:01 PM   Specimen: Nasopharyngeal Swab; Nasopharyngeal(NP) swabs in vial transport medium  Result Value Ref Range Status   SARS Coronavirus 2 by RT PCR  NEGATIVE NEGATIVE Final    Comment: (NOTE) SARS-CoV-2 target nucleic acids are NOT DETECTED.  The SARS-CoV-2 RNA is generally detectable in upper respiratory specimens during the acute phase of infection. The lowest concentration of SARS-CoV-2 viral copies this assay can detect is 138 copies/mL. A negative result does not preclude SARS-Cov-2 infection and should not be used as the sole basis for treatment or other patient management decisions. A negative result may occur with  improper specimen collection/handling, submission of specimen other than nasopharyngeal swab, presence of viral mutation(s) within the areas targeted by this assay, and inadequate number of viral copies(<138 copies/mL). A negative result must be combined with clinical observations, patient history, and epidemiological information. The expected result is Negative.  Fact Sheet for Patients:  EntrepreneurPulse.com.au  Fact Sheet for Healthcare Providers:  IncredibleEmployment.be  This test is no t yet approved or cleared by the [place] FDA and  has been authorized for detection and/or diagnosis of SARS-CoV-2 by FDA under an Emergency Use Authorization (EUA). This EUA will remain  in effect (meaning this test can be used) for the duration of the COVID-19 declaration under Section 564(b)(1) of the Act, 21 U.S.C.section 360bbb-3(b)(1), unless the authorization is terminated  or revoked sooner.       Influenza A by PCR NEGATIVE NEGATIVE Final   Influenza B by PCR NEGATIVE NEGATIVE Final    Comment: (NOTE) The Xpert Xpress SARS-CoV-2/FLU/RSV plus assay is intended as an aid in the diagnosis of influenza from Nasopharyngeal swab specimens and should not be used as a sole basis for treatment. Nasal washings and aspirates are unacceptable for Xpert Xpress SARS-CoV-2/FLU/RSV testing.  Fact Sheet for Patients: EntrepreneurPulse.com.au  Fact Sheet for  Healthcare Providers: IncredibleEmployment.be  This test is not yet approved or cleared by the [place] FDA and has been authorized for detection and/or diagnosis of SARS-CoV-2 by FDA under an Emergency Use Authorization (EUA). This EUA will remain in effect (meaning this test can be used) for the duration of the COVID-19 declaration under Section 564(b)(1) of the Act, 21 U.S.C. section 360bbb-3(b)(1), unless the authorization is terminated or revoked.  Performed at [facility], [place] [address], [place], [place] [address]      Labs: Basic Metabolic Panel: Recent Labs  Lab 10/17/20 1122 10/17/20 1745 10/17/20 2148 10/18/20 0337 10/18/20 0740 10/18/20 1050 10/19/20 0601 10/20/20 0309 10/21/20 0315  NA 137   < > 138   < > 138 138 136 136 137  K 3.8   < > 3.4*   < > 3.9 3.9 3.9 3.6 3.6  CL 103   < > 108   < > 107 110 108 107 107  CO2 22   < > 21*   < > 24 21* 20* 23 22  GLUCOSE 382*   < > 156*   < > 203* 187* 208* 203* 206*  BUN 10   < > 9   < > 8 8 5* <5* 6  CREATININE 1.06   < >  0.79   < > 0.83 0.75 0.55* 0.61 0.78  CALCIUM 9.2   < > 8.5*   < > 8.6* 8.6* 8.5* 8.3* 8.8*  MG 1.5*  --  1.9  --   --   --   --   --   --    < > = values in this interval not displayed.   Liver Function Tests: Recent Labs  Lab 10/17/20 1122  AST 31  ALT 18  ALKPHOS 67  BILITOT 0.9  PROT 7.4  ALBUMIN 3.5   Recent Labs  Lab 10/17/20 1122  LIPASE 30   No results for input(s): AMMONIA in the last 168 hours. CBC: Recent Labs  Lab 10/17/20 1122  WBC 10.9*  NEUTROABS 7.5  HGB 14.7  HCT 43.9  MCV 101.4*  PLT 323   Cardiac Enzymes: No results for input(s): CKTOTAL, CKMB, CKMBINDEX, TROPONINI in the last 168 hours. BNP: BNP (last 3 results) No results for input(s): BNP in the last 8760 hours.  ProBNP (last 3 results) No results for input(s): PROBNP in the last 8760 hours.  CBG: Recent Labs  Lab 10/20/20 0741 10/20/20 1230  10/20/20 1605 10/20/20 2054 10/21/20 0734  GLUCAP 240* 177* 184* 188* 205*       Signed:  Nita Sells MD   [facility] 10/21/2020, 8:58 AM

## 2020-10-21 NOTE — Plan of Care (Signed)
  Problem: Health Behavior/Discharge Planning: Goal: Ability to manage health-related needs will improve Outcome: Progressing   Problem: Activity: Goal: Risk for activity intolerance will decrease Outcome: Progressing   Problem: Nutrition: Goal: Adequate nutrition will be maintained Outcome: Progressing   Problem: Pain Managment: Goal: General experience of comfort will improve Outcome: Progressing   

## 2020-10-21 NOTE — Telephone Encounter (Signed)
at hospital dc 10/21/20 #30 Bryce Mendoza

## 2020-10-21 NOTE — Progress Notes (Signed)
The patient is alert and oriented and has been seen by his physician. The orders for discharge were written. IV has been removed. Went over discharge instructions with patient. He is being discharged via wheelchair with all of his belongings.  

## 2020-10-21 NOTE — TOC Transition Note (Signed)
CSW spoke with pt about continuous glucometer and that he would need to follow up with his PCP as this is not something we can set up for him from the hospital.  Pt is undestanding and states that he has a traditional glucometer at home to use. CSW also inquired as to if pt was able to get all of his medications, pt states this is not an issue and that he is going to see another doctor for some of the other medications he needs. TOC signing off.

## 2020-10-21 NOTE — Progress Notes (Signed)
Also, patient stated that he walked around the hall 3 times so far today as well as one other time late yesterday evening. Patient has walked numerous times between today and yesterday. Patient tolerated his walks well.

## 2020-10-22 ENCOUNTER — Ambulatory Visit: Payer: Self-pay

## 2020-10-22 ENCOUNTER — Telehealth: Payer: Self-pay

## 2020-10-22 NOTE — Telephone Encounter (Signed)
Transition Care Management Follow-up Telephone Call Date of discharge and from where: 10/21/2020, [facility]  How have you been since you were released from the hospital? He said " so far, so good."  Any questions or concerns? No  Items Reviewed: Did the pt receive and understand the discharge instructions provided? Yes  Medications obtained and verified? Yes  - he said he has all medications and did not have any questions about his med regime.  Other? No  Any new allergies since your discharge? No  Do you have support at home?  Lives alone   Home Care and Equipment/Supplies: Were home health services ordered? no If so, what is the name of the agency? N/a  Has the agency set up a time to come to the patient's home? not applicable Were any new equipment or medical supplies ordered?  No What is the name of the medical supply agency? N/a Were you able to get the supplies/equipment? not applicable Do you have any questions related to the use of the equipment or supplies? No  He said that his glucometer reads error... he is not sure where he got it but thinks it was given to him when he was in the hospital. Informed him that an order for a glucometer may have been  sent to his pharmacy.  He said he will check the pharmacy. Instructed him to call this office if they do not have a glucometer for him.   Functional Questionnaire: (I = Independent and D = Dependent) ADLs: independent. Has walker to use when ambulating long distances.    Follow up appointments reviewed:  PCP Hospital f/u appt confirmed? Yes  Scheduled to see Ms Meredeth Ide, NP  on 12/12/2020. He said he will call the clinic if he feels he needs to be seen sooner.   Specialist Hospital f/u appt confirmed? Yes  Scheduled to see [facility] on 11/23/2020  and neurology - 11/20/2020.  Are transportation arrangements needed?  He may need a ride to his appointments. He was interested in [facility]. Informed him that an application can be  downloaded online - he can complete his part and a provider will need to complete the medical portion if he decides to proceed with [facility]. Also provided him with the phone number for [facility] Medicaid transportation. If their condition worsens, is the pt aware to call PCP or go to the Emergency Dept.?Yes  Was the patient provided with contact information for the PCP's office or ED? Yes Was to pt encouraged to call back with questions or concerns? Yes

## 2020-10-22 NOTE — Telephone Encounter (Signed)
Pt c/o not being able to get enugh blood from his finger to check hs blood glucose. Pt stated he ha tried 7 times and keeps getting an error- 11 message. Advised pt to wash hands in warm water, then dry them and hold his hand down and to pick a finger and use a milking motion to dry and drive the blood to the tip. Pt stated that it got him a drop, yet the meter read E-11 again.  E-11 can happen of the meter was jarred or using the strip upside down.  Pt checks his BS 5 times a day. Pt has neuropathy to his hands and feet. Pt stated it is hard to use his fingers when he cannot feel what he is needing to do.  Pt is requesting a Continuous Blood sugar monitor. He also needs and is requesting a new meter, strips,lancets. The [employer] and [employer] recommends a calibration using a meter to make sure the reading on the CGM is correct.  Sending over to office high priority due to inability to check BS. Reason for Disposition  General information question, no triage required and triager able to answer question  Answer Assessment - Initial Assessment Questions 1. REASON FOR CALL or QUESTION: "What is your reason for calling today?" or "How can I best help you?" or "What question do you have that I can help answer?"     Cannot get finger to bleed for checking blood glucose.  Protocols used: Information Only Call - No Triage-A-AH

## 2020-10-23 ENCOUNTER — Other Ambulatory Visit: Payer: Self-pay | Admitting: Nurse Practitioner

## 2020-10-23 MED ORDER — FREESTYLE LIBRE 14 DAY SENSOR MISC: 6 refills | Status: DC

## 2020-10-23 MED ORDER — FREESTYLE LIBRE 14 DAY READER DEVI: 6 refills | Status: DC

## 2020-10-23 NOTE — Telephone Encounter (Signed)
Routing to PCP for review.

## 2020-10-23 NOTE — Telephone Encounter (Signed)
Requested medications are due for refill today.  yes  Requested medications are on the active medications list.  yes  Last refill.   Future visit scheduled.   yes  Notes to clinic.  Machine that was ordered is not longer made. New rx is needed .

## 2020-10-23 NOTE — Progress Notes (Signed)
Lab Results  Component Value Date   HGBA1C 9.4 (H) 09/18/2020

## 2020-10-30 ENCOUNTER — Inpatient Hospital Stay (HOSPITAL_COMMUNITY)
Admission: EM | Admit: 2020-10-30 | Discharge: 2020-11-04 | DRG: 440 | Disposition: A | Discharge status: Home / Self Care | Payer: Medicaid Other | Attending: Internal Medicine | Admitting: Internal Medicine

## 2020-10-30 ENCOUNTER — Encounter (HOSPITAL_COMMUNITY): Payer: Self-pay | Admitting: *Deleted

## 2020-10-30 ENCOUNTER — Emergency Department (HOSPITAL_COMMUNITY): Payer: Medicaid Other

## 2020-10-30 ENCOUNTER — Other Ambulatory Visit: Payer: Self-pay

## 2020-10-30 ENCOUNTER — Ambulatory Visit: Payer: Self-pay

## 2020-10-30 DIAGNOSIS — Z86711 Personal history of pulmonary embolism: Secondary | ICD-10-CM | POA: Diagnosis not present

## 2020-10-30 DIAGNOSIS — K8591 Acute pancreatitis with uninfected necrosis, unspecified: Secondary | ICD-10-CM | POA: Diagnosis not present

## 2020-10-30 DIAGNOSIS — Z807 Family history of other malignant neoplasms of lymphoid, hematopoietic and related tissues: Secondary | ICD-10-CM

## 2020-10-30 DIAGNOSIS — G8929 Other chronic pain: Secondary | ICD-10-CM | POA: Diagnosis present

## 2020-10-30 DIAGNOSIS — E119 Type 2 diabetes mellitus without complications: Secondary | ICD-10-CM | POA: Diagnosis present

## 2020-10-30 DIAGNOSIS — Z86718 Personal history of other venous thrombosis and embolism: Secondary | ICD-10-CM | POA: Diagnosis not present

## 2020-10-30 DIAGNOSIS — K859 Acute pancreatitis without necrosis or infection, unspecified: Secondary | ICD-10-CM | POA: Diagnosis present

## 2020-10-30 DIAGNOSIS — Z20822 Contact with and (suspected) exposure to covid-19: Secondary | ICD-10-CM | POA: Diagnosis present

## 2020-10-30 DIAGNOSIS — K5900 Constipation, unspecified: Secondary | ICD-10-CM | POA: Diagnosis present

## 2020-10-30 DIAGNOSIS — K861 Other chronic pancreatitis: Secondary | ICD-10-CM | POA: Diagnosis present

## 2020-10-30 DIAGNOSIS — Z7901 Long term (current) use of anticoagulants: Secondary | ICD-10-CM | POA: Diagnosis not present

## 2020-10-30 DIAGNOSIS — E785 Hyperlipidemia, unspecified: Secondary | ICD-10-CM | POA: Diagnosis present

## 2020-10-30 DIAGNOSIS — Z7984 Long term (current) use of oral hypoglycemic drugs: Secondary | ICD-10-CM

## 2020-10-30 DIAGNOSIS — Z79899 Other long term (current) drug therapy: Secondary | ICD-10-CM

## 2020-10-30 DIAGNOSIS — R109 Unspecified abdominal pain: Secondary | ICD-10-CM | POA: Diagnosis not present

## 2020-10-30 DIAGNOSIS — F17210 Nicotine dependence, cigarettes, uncomplicated: Secondary | ICD-10-CM | POA: Diagnosis present

## 2020-10-30 DIAGNOSIS — G4700 Insomnia, unspecified: Secondary | ICD-10-CM | POA: Diagnosis present

## 2020-10-30 LAB — COMPREHENSIVE METABOLIC PANEL
ALT: 19 U/L (ref 0–44)
AST: 25 U/L (ref 15–41)
Albumin: 4.1 g/dL (ref 3.5–5.0)
Alkaline Phosphatase: 71 U/L (ref 38–126)
Anion gap: 10 (ref 5–15)
BUN: 10 mg/dL (ref 6–20)
CO2: 21 mmol/L — ABNORMAL LOW (ref 22–32)
Calcium: 9.7 mg/dL (ref 8.9–10.3)
Chloride: 105 mmol/L (ref 98–111)
Creatinine, Ser: 1.02 mg/dL (ref 0.61–1.24)
GFR, Estimated: >60 mL/min (ref >60)
Glucose, Bld: 202 mg/dL — ABNORMAL HIGH (ref 70–99)
Potassium: 4.4 mmol/L (ref 3.5–5.1)
Sodium: 136 mmol/L (ref 135–145)
Total Bilirubin: 0.5 mg/dL (ref 0.3–1.2)
Total Protein: 7.8 g/dL (ref 6.5–8.1)

## 2020-10-30 LAB — CBC
HCT: 46 % (ref 39.0–52.0)
Hemoglobin: 15.4 g/dL (ref 13.0–17.0)
MCH: 32.9 pg (ref 26.0–34.0)
MCHC: 33.5 g/dL (ref 30.0–36.0)
MCV: 98.3 fL (ref 80.0–100.0)
Platelets: 224 10*3/uL (ref 150–400)
RBC: 4.68 MIL/uL (ref 4.22–5.81)
RDW: 13.1 % (ref 11.5–15.5)
WBC: 9.9 10*3/uL (ref 4.0–10.5)
nRBC: 0 % (ref 0.0–0.2)

## 2020-10-30 LAB — URINALYSIS, ROUTINE W REFLEX MICROSCOPIC
Bilirubin Urine: NEGATIVE
Glucose, UA: NEGATIVE mg/dL
Hgb urine dipstick: NEGATIVE
Ketones, ur: 5 mg/dL — AB
Leukocytes,Ua: NEGATIVE
Nitrite: NEGATIVE
Protein, ur: NEGATIVE mg/dL
Specific Gravity, Urine: >1.046 — ABNORMAL HIGH (ref 1.005–1.030)
pH: 5 (ref 5.0–8.0)

## 2020-10-30 LAB — RESP PANEL BY RT-PCR (FLU A&B, COVID) ARPGX2
Influenza A by PCR: NEGATIVE
Influenza B by PCR: NEGATIVE
SARS Coronavirus 2 by RT PCR: NEGATIVE

## 2020-10-30 LAB — LIPASE, BLOOD: Lipase: 26 U/L (ref 11–51)

## 2020-10-30 MED ORDER — APIXABAN 5 MG PO TABS
5.0000 mg | ORAL_TABLET | Freq: Two times a day (BID) | ORAL | Status: DC
  Administered 2020-10-30 – 2020-11-04 (×10): 5 mg via ORAL
  Filled 2020-10-30 (×10): qty 1

## 2020-10-30 MED ORDER — PANCRELIPASE (LIP-PROT-AMYL) 12000-38000 UNITS PO CPEP
12000.0000 [IU] | ORAL_CAPSULE | Freq: Three times a day (TID) | ORAL | Status: DC
  Administered 2020-11-01 – 2020-11-04 (×10): 12000 [IU] via ORAL
  Filled 2020-10-30 (×10): qty 1

## 2020-10-30 MED ORDER — HYDROMORPHONE HCL 1 MG/ML IJ SOLN
1.0000 mg | Freq: Once | INTRAMUSCULAR | Status: AC
  Administered 2020-10-30: 1 mg via INTRAVENOUS
  Filled 2020-10-30: qty 1

## 2020-10-30 MED ORDER — HYDROXYZINE PAMOATE 25 MG PO CAPS
25.0000 mg | ORAL_CAPSULE | Freq: Three times a day (TID) | ORAL | Status: DC | PRN
  Filled 2020-10-30: qty 1

## 2020-10-30 MED ORDER — ATORVASTATIN CALCIUM 40 MG PO TABS
40.0000 mg | ORAL_TABLET | Freq: Every day | ORAL | Status: DC
  Administered 2020-10-31 – 2020-11-04 (×5): 40 mg via ORAL
  Filled 2020-10-30 (×5): qty 1

## 2020-10-30 MED ORDER — LORAZEPAM 1 MG PO TABS
1.0000 mg | ORAL_TABLET | Freq: Two times a day (BID) | ORAL | Status: DC | PRN
  Administered 2020-10-30 – 2020-11-02 (×6): 1 mg via ORAL
  Filled 2020-10-30 (×7): qty 1

## 2020-10-30 MED ORDER — ONDANSETRON HCL 4 MG/2ML IJ SOLN
4.0000 mg | Freq: Four times a day (QID) | INTRAMUSCULAR | Status: DC | PRN
  Administered 2020-10-30 – 2020-11-03 (×6): 4 mg via INTRAVENOUS
  Filled 2020-10-30 (×7): qty 2

## 2020-10-30 MED ORDER — INSULIN ASPART 100 UNIT/ML IJ SOLN
0.0000 [IU] | Freq: Four times a day (QID) | INTRAMUSCULAR | Status: DC
  Administered 2020-10-31 – 2020-11-02 (×7): 1 [IU] via SUBCUTANEOUS
  Administered 2020-11-02: 2 [IU] via SUBCUTANEOUS
  Administered 2020-11-02: 1 [IU] via SUBCUTANEOUS
  Administered 2020-11-03 – 2020-11-04 (×3): 2 [IU] via SUBCUTANEOUS
  Filled 2020-10-30: qty 0.09

## 2020-10-30 MED ORDER — LACTATED RINGERS IV SOLN
INTRAVENOUS | Status: DC
  Administered 2020-10-31: 150 mL/h via INTRAVENOUS

## 2020-10-30 MED ORDER — SUCRALFATE 1 G PO TABS
1.0000 g | ORAL_TABLET | Freq: Three times a day (TID) | ORAL | Status: DC
  Administered 2020-10-30 – 2020-11-04 (×15): 1 g via ORAL
  Filled 2020-10-30 (×15): qty 1

## 2020-10-30 MED ORDER — SODIUM CHLORIDE 0.9 % IV SOLN
8.0000 mg | Freq: Once | INTRAVENOUS | Status: AC
  Administered 2020-10-30: 8 mg via INTRAVENOUS
  Filled 2020-10-30: qty 4

## 2020-10-30 MED ORDER — PANTOPRAZOLE SODIUM 40 MG PO TBEC
40.0000 mg | DELAYED_RELEASE_TABLET | Freq: Every day | ORAL | Status: DC
  Administered 2020-10-30 – 2020-11-04 (×6): 40 mg via ORAL
  Filled 2020-10-30 (×6): qty 1

## 2020-10-30 MED ORDER — LACTATED RINGERS IV BOLUS
1000.0000 mL | Freq: Once | INTRAVENOUS | Status: AC
  Administered 2020-10-30: 1000 mL via INTRAVENOUS

## 2020-10-30 MED ORDER — IOHEXOL 350 MG/ML SOLN
100.0000 mL | Freq: Once | INTRAVENOUS | Status: AC | PRN
  Administered 2020-10-30: 80 mL via INTRAVENOUS

## 2020-10-30 MED ORDER — HYDROMORPHONE HCL 1 MG/ML IJ SOLN
1.0000 mg | INTRAMUSCULAR | Status: DC | PRN
  Administered 2020-10-30 – 2020-11-02 (×14): 1 mg via INTRAVENOUS
  Filled 2020-10-30 (×14): qty 1
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
## 2020-10-30 NOTE — Telephone Encounter (Signed)
Pt c/o intermittent severe abdominal pain.  Pain occasionally referred to right lower back.Pt occasionally has episodes of projectile vomiting. Pt stated that it feels like after he eats the food stays in his stomach and is very slow to digest. Pt has not had a BM in 6 pays.  Pt stated that pain began in July. Pt has not been able to check is BS because his meter is broke. Pt has a tongue that feels like "sandpaper. Pt is not sure if he is completely emptying his bladder and feels "pressure.". He describes burning with voiding. Pt stated he has been using Tylenol to help with pain with little effect. Pt  c/o lower back to the right side pain. Pt has h/o pancreatitis.  Advised pt to go to ED- Pt stated he will go to [facility]. Care advice given and pt verbalized understanding.         Reason for Disposition  [1] SEVERE pain (e.g., excruciating) AND [2] present > 1 hour  Answer Assessment - Initial Assessment Questions 1. LOCATION: "Where does it hurt?"      Center and spreads throughout abdomen that last 30 sec to 30 minutes some time 2. RADIATION: "Does the pain shoot anywhere else?" (e.g., chest, back)     Occasionally goes  lower tight back - constant l pain 3. ONSET: "When did the pain begin?" (Minutes, hours or days ago)      July  4. SUDDEN: "Gradual or sudden onset?"     gradually 5. PATTERN "Does the pain come and go, or is it constant?"    - If constant: "Is it getting better, staying the same, or worsening?"      (Note: Constant means the pain never goes away completely; most serious pain is constant and it progresses)     - If intermittent: "How long does it last?" "Do you have pain now?"     (Note: Intermittent means the pain goes away completely between bouts)     Comes and goes to 30 minutes- constant painlower right back 6. SEVERITY: "How bad is the pain?"  (e.g., Scale 1-10; mild, moderate, or severe)    - MILD (1-3): doesn't interfere with normal activities, abdomen  soft and not tender to touch     - MODERATE (4-7): interferes with normal activities or awakens from sleep, abdomen tender to touch     - SEVERE (8-10): excruciating pain, doubled over, unable to do any normal activities       severe 7. RECURRENT SYMPTOM: "Have you ever had this type of stomach pain before?" If Yes, ask: "When was the last time?" and "What happened that time?"      Yes- hospitalized for pancreatitis 8. CAUSE: "What do you think is causing the stomach pain?"     pancreatitis 9. RELIEVING/AGGRAVATING FACTORS: "What makes it better or worse?" (e.g., movement, antacids, bowel movement)     Tylenol makes pain decreased but still present      worse: drinking water and eating and builds pressure in abdomen which but leads to cramping 10. OTHER SYMPTOMS: "Do you have any other symptoms?" (e.g., back pain, diarrhea, fever, urination pain, vomiting)       Projectile vomiting, decreased urine out put, right lower back pain,burning and pressure to bladder.  Protocols used: Abdominal Pain - Male-A-AH

## 2020-10-30 NOTE — ED Notes (Signed)
Pt given urinal.
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
## 2020-10-30 NOTE — ED Notes (Signed)
Patient requesting lab draw with IV start. 

## 2020-10-30 NOTE — ED Provider Notes (Signed)
Emergency Medicine Provider Triage Evaluation Note  Bryce Mendoza , a 60 y.o. male  was evaluated in triage.  Pt complains of recurrent pancreatitis pain. Patient has been hospitalized multiple times for complicated pancreatitis 2/2 alcohol usage who presents with acute on chronic abdominal pain, nausea, and vomiting. He was last hospitalized 8 days ago. He was taking hydrocodone for the pain but ran out. He reports he is not longer drinking alcohol.  Review of Systems  Positive: Nausea, vomiting, abdominal pain Negative: Hematemesis, diarrhea, chest pain  Physical Exam  BP (!) 144/99 (BP Location: Left Arm)   Pulse (!) 108   Temp 98.5 F (36.9 C) (Oral)   Resp 17   SpO2 94%  Gen:   Awake, no distress   Resp:  Normal effort  MSK:   Moves extremities without difficulty  Other:  Generalized abdominal tenderness worst in epigastric region and RUQ  Medical Decision Making  Medically screening exam initiated at 12:57 PM.  Appropriate orders placed.  Anup Brigham was informed that the remainder of the evaluation will be completed by another provider, this initial triage assessment does not replace that evaluation, and the importance of remaining in the ED until their evaluation is complete.  Abdominal pain, recurrent pancreatitis.   [place] 10/30/20 1300    Wynetta Fines, MD 10/31/20 2242

## 2020-10-30 NOTE — ED Triage Notes (Signed)
Pt complains of abdominal pain and emesis x 5 days, hx pancreatitis.

## 2020-10-31 LAB — GLUCOSE, CAPILLARY
Glucose-Capillary: 116 mg/dL — ABNORMAL HIGH (ref 70–99)
Glucose-Capillary: 124 mg/dL — ABNORMAL HIGH (ref 70–99)
Glucose-Capillary: 138 mg/dL — ABNORMAL HIGH (ref 70–99)
Glucose-Capillary: 140 mg/dL — ABNORMAL HIGH (ref 70–99)

## 2020-10-31 LAB — COMPREHENSIVE METABOLIC PANEL
ALT: 15 U/L (ref 0–44)
AST: 23 U/L (ref 15–41)
Albumin: 3.2 g/dL — ABNORMAL LOW (ref 3.5–5.0)
Alkaline Phosphatase: 56 U/L (ref 38–126)
Anion gap: 9 (ref 5–15)
BUN: 8 mg/dL (ref 6–20)
CO2: 21 mmol/L — ABNORMAL LOW (ref 22–32)
Calcium: 9.2 mg/dL (ref 8.9–10.3)
Chloride: 112 mmol/L — ABNORMAL HIGH (ref 98–111)
Creatinine, Ser: 0.67 mg/dL (ref 0.61–1.24)
GFR, Estimated: >60 mL/min (ref >60)
Glucose, Bld: 134 mg/dL — ABNORMAL HIGH (ref 70–99)
Potassium: 3.8 mmol/L (ref 3.5–5.1)
Sodium: 142 mmol/L (ref 135–145)
Total Bilirubin: 0.6 mg/dL (ref 0.3–1.2)
Total Protein: 6.3 g/dL — ABNORMAL LOW (ref 6.5–8.1)

## 2020-10-31 LAB — CBC
HCT: 38.1 % — ABNORMAL LOW (ref 39.0–52.0)
Hemoglobin: 13 g/dL (ref 13.0–17.0)
MCH: 32.8 pg (ref 26.0–34.0)
MCHC: 34.1 g/dL (ref 30.0–36.0)
MCV: 96.2 fL (ref 80.0–100.0)
Platelets: 176 10*3/uL (ref 150–400)
RBC: 3.96 MIL/uL — ABNORMAL LOW (ref 4.22–5.81)
RDW: 12.9 % (ref 11.5–15.5)
WBC: 6.8 10*3/uL (ref 4.0–10.5)
nRBC: 0 % (ref 0.0–0.2)

## 2020-10-31 MED ORDER — HYDROMORPHONE HCL 1 MG/ML IJ SOLN
2.0000 mg | Freq: Once | INTRAMUSCULAR | Status: AC
  Administered 2020-10-31: 2 mg via INTRAVENOUS
  Filled 2020-10-31: qty 2

## 2020-10-31 NOTE — Progress Notes (Signed)
PROGRESS NOTE    Tayten Bergdoll  NKN:[id number] DOB: 05/27/1960 DOA: 10/30/2020 PCP: Claiborne Rigg, NP    Brief Narrative:  60 year old history of DVT PE, hyperlipidemia, severe pulm neuropathy, recurrent pancreatitis and chronic abdominal pain presented with abdominal pain nausea and vomiting.  Ran out of pain medications at home.  Recently discharged from the hospital.  Decreased urine output for last 3 days.  In the emergency room hemodynamically stable.  CT scan abdomen pelvis revealed findings consistent with necrotizing pancreatitis and associated fluid collection slightly decreased than before.  Lipase was normal.   Assessment & Plan:   Principal Problem:   Necrotizing pancreatitis Active Problems:   Acute pancreatitis   HLD (hyperlipidemia)   DM type 2 (diabetes mellitus, type 2) (HCC)   History of pulmonary embolus (PE)  Acute on chronic recurrent necrotizing pancreatitis: Still with severe symptoms. N.p.o. except ice chips Adequate pain control with IV and oral opiates, antiemetic, Creon, Carafate, PPI, IV fluids.  Check electrolytes and replace.  Type diabetes: Well-controlled.  On glipizide and metformin at home.  Holding.  Currently on insulin.  History of DVT and PE: On Eliquis that he will continue.  Do not anticipate any surgical procedure.  Hyperlipidemia: On Lipitor that continued.   DVT prophylaxis:  apixaban (ELIQUIS) tablet 5 mg   Code Status: Full code Family Communication: None Disposition Plan: Status is: Inpatient  Remains inpatient appropriate because:IV treatments appropriate due to intensity of illness or inability to take PO and Inpatient level of care appropriate due to severity of illness  Dispo: The patient is from: Home              Anticipated d/c is to: Home              Patient currently is not medically stable to d/c.   Difficult to place patient No         Consultants:  None  Procedures:  None  Antimicrobials:   None   Subjective: Patient was seen and examined.  Continues to have severe abdominal pain.  He was asking for additional dose of pain medications for some relief.  Denies any nausea but he has not tried to even have ice chips.  No bowel movement for last 4 days.  Feels bloated and tender.  Objective: Vitals:   10/30/20 2022 10/30/20 2207 10/31/20 0113 10/31/20 0527  BP: (!) 150/101 108/86 122/84 124/85  Pulse: 86 82 72 72  Resp:  17 16 16   Temp: 98 F (36.7 C) 98.1 F (36.7 C) 98 F (36.7 C) 97.9 F (36.6 C)  TempSrc: Oral Oral Oral Oral  SpO2: 97% 97% 93% 96%  Weight:      Height:        Intake/Output Summary (Last 24 hours) at 10/31/2020 1334 Last data filed at 10/31/2020 1157 Gross per 24 hour  Intake 1301.63 ml  Output 1850 ml  Net -548.37 ml   Filed Weights   10/30/20 1807  Weight: 128.6 kg    Examination:  General exam: Appears calm and comfortable, slightly anxious.  Not in any distress. Respiratory system: Clear to auscultation. Respiratory effort normal.  No added sounds. Cardiovascular system: S1 & S2 heard, RRR.  Gastrointestinal system: Soft.  Obese and pendulous.  No point tenderness, residual guarding.  He has diffuse tenderness all over. Central nervous system: Alert and oriented. No focal neurological deficits. Extremities: Symmetric 5 x 5 power. Skin: No rashes, lesions or ulcers Psychiatry: Judgement and insight appear normal. Mood &  affect appropriate.     Data Reviewed: I have personally reviewed following labs and imaging studies  CBC: Recent Labs  Lab 10/30/20 1430 10/31/20 0430  WBC 9.9 6.8  HGB 15.4 13.0  HCT 46.0 38.1*  MCV 98.3 96.2  PLT 224 176   Basic Metabolic Panel: Recent Labs  Lab 10/30/20 1430 10/31/20 0430  NA 136 142  K 4.4 3.8  CL 105 112*  CO2 21* 21*  GLUCOSE 202* 134*  BUN 10 8  CREATININE 1.02 0.67  CALCIUM 9.7 9.2   GFR: Estimated Creatinine Clearance: 141.5 mL/min (by C-G formula based on SCr of  0.67 mg/dL). Liver Function Tests: Recent Labs  Lab 10/30/20 1430 10/31/20 0430  AST 25 23  ALT 19 15  ALKPHOS 71 56  BILITOT 0.5 0.6  PROT 7.8 6.3*  ALBUMIN 4.1 3.2*   Recent Labs  Lab 10/30/20 1430  LIPASE 26   No results for input(s): AMMONIA in the last 168 hours. Coagulation Profile: No results for input(s): INR, PROTIME in the last 168 hours. Cardiac Enzymes: No results for input(s): CKTOTAL, CKMB, CKMBINDEX, TROPONINI in the last 168 hours. BNP (last 3 results) No results for input(s): PROBNP in the last 8760 hours. HbA1C: No results for input(s): HGBA1C in the last 72 hours. CBG: Recent Labs  Lab 10/30/20 2210 10/31/20 1155  GLUCAP 140* 138*   Lipid Profile: No results for input(s): CHOL, HDL, LDLCALC, TRIG, CHOLHDL, LDLDIRECT in the last 72 hours. Thyroid Function Tests: No results for input(s): TSH, T4TOTAL, FREET4, T3FREE, THYROIDAB in the last 72 hours. Anemia Panel: No results for input(s): VITAMINB12, FOLATE, FERRITIN, TIBC, IRON, RETICCTPCT in the last 72 hours. Sepsis Labs: No results for input(s): PROCALCITON, LATICACIDVEN in the last 168 hours.  Recent Results (from the past 240 hour(s))  Resp Panel by RT-PCR (Flu A&B, Covid) Nasopharyngeal Swab     Status: None   Collection Time: 10/30/20  5:29 PM   Specimen: Nasopharyngeal Swab; Nasopharyngeal(NP) swabs in vial transport medium  Result Value Ref Range Status   SARS Coronavirus 2 by RT PCR NEGATIVE NEGATIVE Final    Comment: (NOTE) SARS-CoV-2 target nucleic acids are NOT DETECTED.  The SARS-CoV-2 RNA is generally detectable in upper respiratory specimens during the acute phase of infection. The lowest concentration of SARS-CoV-2 viral copies this assay can detect is 138 copies/mL. A negative result does not preclude SARS-Cov-2 infection and should not be used as the sole basis for treatment or other patient management decisions. A negative result may occur with  improper specimen  collection/handling, submission of specimen other than nasopharyngeal swab, presence of viral mutation(s) within the areas targeted by this assay, and inadequate number of viral copies(<138 copies/mL). A negative result must be combined with clinical observations, patient history, and epidemiological information. The expected result is Negative.  Fact Sheet for Patients:  BloggerCourse.com  Fact Sheet for Healthcare Providers:  SeriousBroker.it  This test is no t yet approved or cleared by the [place] FDA and  has been authorized for detection and/or diagnosis of SARS-CoV-2 by FDA under an Emergency Use Authorization (EUA). This EUA will remain  in effect (meaning this test can be used) for the duration of the COVID-19 declaration under Section 564(b)(1) of the Act, 21 U.S.C.section 360bbb-3(b)(1), unless the authorization is terminated  or revoked sooner.       Influenza A by PCR NEGATIVE NEGATIVE Final   Influenza B by PCR NEGATIVE NEGATIVE Final    Comment: (NOTE) The Xpert Xpress SARS-CoV-2/FLU/RSV  plus assay is intended as an aid in the diagnosis of influenza from Nasopharyngeal swab specimens and should not be used as a sole basis for treatment. Nasal washings and aspirates are unacceptable for Xpert Xpress SARS-CoV-2/FLU/RSV testing.  Fact Sheet for Patients: BloggerCourse.com  Fact Sheet for Healthcare Providers: SeriousBroker.it  This test is not yet approved or cleared by the [place] FDA and has been authorized for detection and/or diagnosis of SARS-CoV-2 by FDA under an Emergency Use Authorization (EUA). This EUA will remain in effect (meaning this test can be used) for the duration of the COVID-19 declaration under Section 564(b)(1) of the Act, 21 U.S.C. section 360bbb-3(b)(1), unless the authorization is terminated or revoked.  Performed at [facility], [address], [place], [place] [address]          Radiology Studies: CT ABDOMEN PELVIS W CONTRAST  Result Date: 10/30/2020 CLINICAL DATA:  Acute generalized abdominal pain, emesis. EXAM: CT ABDOMEN AND PELVIS WITH CONTRAST TECHNIQUE: Multidetector CT imaging of the abdomen and pelvis was performed using the standard protocol following bolus administration of intravenous contrast. CONTRAST:  56mL OMNIPAQUE IOHEXOL 350 MG/ML SOLN COMPARISON:  October 17, 2020. FINDINGS: Lower chest: Minimal bibasilar subsegmental atelectasis is noted. Hepatobiliary: No gallstones or biliary dilatation is noted. Hepatic steatosis. Pancreas: Findings consistent with necrotizing pancreatitis involving pancreatic body and tail. Associated fluid collection measures approximately 11.8 x 3.5 cm which is slightly decreased compared to prior exam. 3.5 x 2.2 cm fluid collection is seen inferior and anterior to pancreatic head consistent with pseudocyst which is slightly decreased compared to prior exam. Spleen: Normal in size without focal abnormality. Adrenals/Urinary Tract: Adrenal glands are unremarkable. Kidneys are normal, without renal calculi, focal lesion, or hydronephrosis. Bladder is unremarkable. Stomach/Bowel: The stomach appears normal. There is no evidence of bowel obstruction or inflammation. Vascular/Lymphatic: Aortic atherosclerosis. No enlarged abdominal or pelvic lymph nodes. Reproductive: Prostate is unremarkable. Other: No abdominal wall hernia or abnormality. No abdominopelvic ascites. Musculoskeletal: No acute or significant osseous findings. IMPRESSION: Findings consistent with necrotizing pancreatitis involving the pancreatic body and tail. Associated fluid collection is slightly decreased compared to prior exam. Probable pseudocyst is seen inferior and anterior to the pancreatic head which is also slightly decreased compared to prior exam. Hepatic steatosis. Aortic Atherosclerosis  (ICD10-I70.0). Electronically Signed   By: Lupita Raider M.D.   On: 10/30/2020 16:57        Scheduled Meds:  apixaban  5 mg Oral BID   atorvastatin  40 mg Oral Daily   insulin aspart  0-9 Units Subcutaneous Q6H   lipase/protease/amylase  12,000 Units Oral TID AC   pantoprazole  40 mg Oral Daily   sucralfate  1 g Oral TID WC & HS   Continuous Infusions:  lactated ringers 150 mL/hr (10/31/20 0127)     LOS: 1 day    Time spent: 32 minutes    Dorcas Carrow, MD [facility] Pager [phone]

## 2020-11-01 ENCOUNTER — Telehealth: Payer: Self-pay | Admitting: Nurse Practitioner

## 2020-11-01 LAB — GLUCOSE, CAPILLARY
Glucose-Capillary: 116 mg/dL — ABNORMAL HIGH (ref 70–99)
Glucose-Capillary: 117 mg/dL — ABNORMAL HIGH (ref 70–99)
Glucose-Capillary: 134 mg/dL — ABNORMAL HIGH (ref 70–99)
Glucose-Capillary: 138 mg/dL — ABNORMAL HIGH (ref 70–99)

## 2020-11-01 MED ORDER — POLYETHYLENE GLYCOL 3350 17 G PO PACK
17.0000 g | PACK | Freq: Two times a day (BID) | ORAL | Status: DC
  Administered 2020-11-01 (×2): 17 g via ORAL
  Filled 2020-11-01 (×3): qty 1

## 2020-11-01 MED ORDER — HYDROCODONE-ACETAMINOPHEN 10-325 MG PO TABS
1.0000 | ORAL_TABLET | Freq: Four times a day (QID) | ORAL | Status: DC | PRN
  Administered 2020-11-01 – 2020-11-04 (×10): 1 via ORAL
  Filled 2020-11-01 (×10): qty 1

## 2020-11-01 NOTE — Progress Notes (Signed)
Initial Nutrition Assessment  DOCUMENTATION CODES:   Obesity unspecified  INTERVENTION:   Once diet advanced: -Ensure MAX Protein po BID, each supplement provides 150 kcal and 30 grams of protein   NUTRITION DIAGNOSIS:   Increased nutrient needs related to chronic illness (necrotizing pancreatitis) as evidenced by estimated needs.  GOAL:   Patient will meet greater than or equal to 90% of their needs  MONITOR:   Diet advancement, Labs, Weight trends, I & O's  REASON FOR ASSESSMENT:   Malnutrition Screening Tool    ASSESSMENT:   60 y.o. male with medical history significant of DVT/PE, dyslipidemia, severe polyneuropathy, recurrent pancreatitis who presented to the hospital with abdominal pain, nausea, vomiting.  Patient reports N/V which started 5 days PTA. Pt was seen during previous admission on 8/19 and provided diet information and supplements. D/c on 8/21.  Pt currently NPO.  Having mild BLE edema.  Once diet advanced, recommend Ensure Max and MVI.  Per weight records, no weight loss noted. Weight from 7/27, only outlier that was significantly higher than usual weights.   Medications: Creon, Miralax, Carafate, Lactated ringers, IV Zofran  Labs reviewed:  CBGs: 116-138  NUTRITION - FOCUSED PHYSICAL EXAM:  No depletions noted.  Diet Order:   Diet Order             Diet NPO time specified Except for: Ice Chips  Diet effective now                   EDUCATION NEEDS:   No education needs have been identified at this time  Skin:  Skin Assessment: Reviewed RN Assessment  Last BM:  8/25  Height:   Ht Readings from Last 1 Encounters:  10/30/20 6\' 1"  (1.854 m)    Weight:   Wt Readings from Last 1 Encounters:  10/30/20 128.6 kg    BMI:  Body mass index is 37.4 kg/m.  Estimated Nutritional Needs:   Kcal:  2300-2500  Protein:  115-125g  Fluid:  2.3L/day   11/01/20, MS, RD, LDN Inpatient Clinical Dietitian Contact information  available via Amion

## 2020-11-01 NOTE — Telephone Encounter (Signed)
I was going to call patient to schedule an appointment but he is currently in the ED.   Copied from CRM [id number]. Topic: General - Other >> Oct 30, 2020 10:59 AM Traci Sermon wrote: Reason for CRM: Pt called in stating he has still not received his Continuous Blood Gluc Receiver (FREESTYLE LIBRE 14 DAY READER) DEVI and has been unable to check his BP, also states he is unable to keep foods down, as well as stomach pains. Please advise.

## 2020-11-01 NOTE — Progress Notes (Signed)
PROGRESS NOTE    Bryce Mendoza  RUE:[id number] DOB: 1961-01-10 DOA: 10/30/2020 PCP: Claiborne RiggFleming, Zelda W, NP    Brief Narrative:  60 year old history of DVT/ PE, hyperlipidemia, severe peripheral neuropathy, recurrent pancreatitis and chronic abdominal pain presented with abdominal pain nausea and vomiting.  Ran out of pain medications at home.  Recently discharged from the hospital.  Decreased urine output for last 3 days.  In the emergency room hemodynamically stable.  CT scan abdomen pelvis revealed findings consistent with necrotizing pancreatitis and associated fluid collection slightly decreased than before.  Lipase was normal. Patient has now developed chronic pain issues as well as needing narcotics for pain relief.   Assessment & Plan:   Principal Problem:   Necrotizing pancreatitis Active Problems:   Acute pancreatitis   HLD (hyperlipidemia)   DM type 2 (diabetes mellitus, type 2) (HCC)   History of pulmonary embolus (PE)  Acute on chronic recurrent necrotizing pancreatitis: Still complains of persistent abdominal pain and unable to eat. N.p.o. except ice chips Adequate pain control with IV and oral opiates, antiemetic, Creon, Carafate, PPI, IV fluids.  Check electrolytes and replace. Will start patient on oral pain medications in hopes of tapering off IV pain medication. He will stay n.p.o. until he is needing IV Dilaudid for pain relief. Start laxative with MiraLAX twice a day today.  Type diabetes: Well-controlled.  On glipizide and metformin at home.  Holding.  Currently on insulin.  History of DVT and PE: On Eliquis that he will continue.  Do not anticipate any surgical procedure.  Hyperlipidemia: On Lipitor that continued.   DVT prophylaxis:  apixaban (ELIQUIS) tablet 5 mg   Code Status: Full code Family Communication: None Disposition Plan: Status is: Inpatient  Remains inpatient appropriate because:IV treatments appropriate due to intensity of illness or  inability to take PO and Inpatient level of care appropriate due to severity of illness  Dispo: The patient is from: Home              Anticipated d/c is to: Home              Patient currently is not medically stable to d/c.   Difficult to place patient No         Consultants:  None  Procedures:  None  Antimicrobials:  None   Subjective: Patient seen and examined.  Continues to have episodic abdominal pain.  Denies any nausea or vomiting.  No bowel movement for the last many days. He is ready to try some oral pain medications but he was not sure he can maintain it. He was wondering whether we can continue to do IV Dilaudid and start him on a diet. Discussed about sending referral to pain management clinic.  We will search for 1.  Objective: Vitals:   10/31/20 0527 10/31/20 1420 10/31/20 2033 11/01/20 0608  BP: 124/85 (!) 160/97 (!) 153/99 (!) 146/96  Pulse: 72 86 69 75  Resp: 16 18 18 18   Temp: 97.9 F (36.6 C) (!) 97.3 F (36.3 C) 97.7 F (36.5 C) 97.6 F (36.4 C)  TempSrc: Oral Oral Oral Oral  SpO2: 96% 97% 93% 95%  Weight:      Height:        Intake/Output Summary (Last 24 hours) at 11/01/2020 1123 Last data filed at 11/01/2020 1000 Gross per 24 hour  Intake 4169.83 ml  Output 2200 ml  Net 1969.83 ml   Filed Weights   10/30/20 1807  Weight: 128.6 kg    Examination:  General exam: Appears calm and comfortable at rest.  Anxious on talking. Respiratory system: Clear to auscultation. Respiratory effort normal.  No added sounds. Cardiovascular system: S1 & S2 heard, RRR.  Gastrointestinal system: Soft.  Obese and pendulous.  Patient does have diffuse tenderness mostly on the epigastric and left upper quadrant.  No rigidity or guarding.   Central nervous system: Alert and oriented. No focal neurological deficits. Extremities: Symmetric 5 x 5 power. Skin: No rashes, lesions or ulcers Psychiatry: Judgement and insight appear normal. Mood & affect  appropriate.     Data Reviewed: I have personally reviewed following labs and imaging studies  CBC: Recent Labs  Lab 10/30/20 1430 10/31/20 0430  WBC 9.9 6.8  HGB 15.4 13.0  HCT 46.0 38.1*  MCV 98.3 96.2  PLT 224 176   Basic Metabolic Panel: Recent Labs  Lab 10/30/20 1430 10/31/20 0430  NA 136 142  K 4.4 3.8  CL 105 112*  CO2 21* 21*  GLUCOSE 202* 134*  BUN 10 8  CREATININE 1.02 0.67  CALCIUM 9.7 9.2   GFR: Estimated Creatinine Clearance: 141.5 mL/min (by C-G formula based on SCr of 0.67 mg/dL). Liver Function Tests: Recent Labs  Lab 10/30/20 1430 10/31/20 0430  AST 25 23  ALT 19 15  ALKPHOS 71 56  BILITOT 0.5 0.6  PROT 7.8 6.3*  ALBUMIN 4.1 3.2*   Recent Labs  Lab 10/30/20 1430  LIPASE 26   No results for input(s): AMMONIA in the last 168 hours. Coagulation Profile: No results for input(s): INR, PROTIME in the last 168 hours. Cardiac Enzymes: No results for input(s): CKTOTAL, CKMB, CKMBINDEX, TROPONINI in the last 168 hours. BNP (last 3 results) No results for input(s): PROBNP in the last 8760 hours. HbA1C: No results for input(s): HGBA1C in the last 72 hours. CBG: Recent Labs  Lab 10/30/20 2210 10/31/20 1155 10/31/20 1701 10/31/20 2358 11/01/20 0604  GLUCAP 140* 138* 116* 124* 138*   Lipid Profile: No results for input(s): CHOL, HDL, LDLCALC, TRIG, CHOLHDL, LDLDIRECT in the last 72 hours. Thyroid Function Tests: No results for input(s): TSH, T4TOTAL, FREET4, T3FREE, THYROIDAB in the last 72 hours. Anemia Panel: No results for input(s): VITAMINB12, FOLATE, FERRITIN, TIBC, IRON, RETICCTPCT in the last 72 hours. Sepsis Labs: No results for input(s): PROCALCITON, LATICACIDVEN in the last 168 hours.  Recent Results (from the past 240 hour(s))  Resp Panel by RT-PCR (Flu A&B, Covid) Nasopharyngeal Swab     Status: None   Collection Time: 10/30/20  5:29 PM   Specimen: Nasopharyngeal Swab; Nasopharyngeal(NP) swabs in vial transport medium   Result Value Ref Range Status   SARS Coronavirus 2 by RT PCR NEGATIVE NEGATIVE Final    Comment: (NOTE) SARS-CoV-2 target nucleic acids are NOT DETECTED.  The SARS-CoV-2 RNA is generally detectable in upper respiratory specimens during the acute phase of infection. The lowest concentration of SARS-CoV-2 viral copies this assay can detect is 138 copies/mL. A negative result does not preclude SARS-Cov-2 infection and should not be used as the sole basis for treatment or other patient management decisions. A negative result may occur with  improper specimen collection/handling, submission of specimen other than nasopharyngeal swab, presence of viral mutation(s) within the areas targeted by this assay, and inadequate number of viral copies(<138 copies/mL). A negative result must be combined with clinical observations, patient history, and epidemiological information. The expected result is Negative.  Fact Sheet for Patients:  BloggerCourse.com  Fact Sheet for Healthcare Providers:  SeriousBroker.it  This test is  no t yet approved or cleared by the [place] and  has been authorized for detection and/or diagnosis of SARS-CoV-2 by FDA under an Emergency Use Authorization (EUA). This EUA will remain  in effect (meaning this test can be used) for the duration of the COVID-19 declaration under Section 564(b)(1) of the Act, 21 U.S.C.section 360bbb-3(b)(1), unless the authorization is terminated  or revoked sooner.       Influenza A by PCR NEGATIVE NEGATIVE Final   Influenza B by PCR NEGATIVE NEGATIVE Final    Comment: (NOTE) The Xpert Xpress SARS-CoV-2/FLU/RSV plus assay is intended as an aid in the diagnosis of influenza from Nasopharyngeal swab specimens and should not be used as a sole basis for treatment. Nasal washings and aspirates are unacceptable for Xpert Xpress SARS-CoV-2/FLU/RSV testing.  Fact Sheet for  Patients: BloggerCourse.com  Fact Sheet for Healthcare Providers: SeriousBroker.it  This test is not yet approved or cleared by the [place] FDA and has been authorized for detection and/or diagnosis of SARS-CoV-2 by FDA under an Emergency Use Authorization (EUA). This EUA will remain in effect (meaning this test can be used) for the duration of the COVID-19 declaration under Section 564(b)(1) of the Act, 21 U.S.C. section 360bbb-3(b)(1), unless the authorization is terminated or revoked.  Performed at [facility], [address] [place], [place] [address]          Radiology Studies: CT ABDOMEN PELVIS W CONTRAST  Result Date: 10/30/2020 CLINICAL DATA:  Acute generalized abdominal pain, emesis. EXAM: CT ABDOMEN AND PELVIS WITH CONTRAST TECHNIQUE: Multidetector CT imaging of the abdomen and pelvis was performed using the standard protocol following bolus administration of intravenous contrast. CONTRAST:  35mL OMNIPAQUE IOHEXOL 350 MG/ML SOLN COMPARISON:  October 17, 2020. FINDINGS: Lower chest: Minimal bibasilar subsegmental atelectasis is noted. Hepatobiliary: No gallstones or biliary dilatation is noted. Hepatic steatosis. Pancreas: Findings consistent with necrotizing pancreatitis involving pancreatic body and tail. Associated fluid collection measures approximately 11.8 x 3.5 cm which is slightly decreased compared to prior exam. 3.5 x 2.2 cm fluid collection is seen inferior and anterior to pancreatic head consistent with pseudocyst which is slightly decreased compared to prior exam. Spleen: Normal in size without focal abnormality. Adrenals/Urinary Tract: Adrenal glands are unremarkable. Kidneys are normal, without renal calculi, focal lesion, or hydronephrosis. Bladder is unremarkable. Stomach/Bowel: The stomach appears normal. There is no evidence of bowel obstruction or inflammation. Vascular/Lymphatic: Aortic  atherosclerosis. No enlarged abdominal or pelvic lymph nodes. Reproductive: Prostate is unremarkable. Other: No abdominal wall hernia or abnormality. No abdominopelvic ascites. Musculoskeletal: No acute or significant osseous findings. IMPRESSION: Findings consistent with necrotizing pancreatitis involving the pancreatic body and tail. Associated fluid collection is slightly decreased compared to prior exam. Probable pseudocyst is seen inferior and anterior to the pancreatic head which is also slightly decreased compared to prior exam. Hepatic steatosis. Aortic Atherosclerosis (ICD10-I70.0). Electronically Signed   By: Lupita Raider M.D.   On: 10/30/2020 16:57        Scheduled Meds:  apixaban  5 mg Oral BID   atorvastatin  40 mg Oral Daily   insulin aspart  0-9 Units Subcutaneous Q6H   lipase/protease/amylase  12,000 Units Oral TID AC   pantoprazole  40 mg Oral Daily   polyethylene glycol  17 g Oral BID   sucralfate  1 g Oral TID WC & HS   Continuous Infusions:  lactated ringers 150 mL/hr at 11/01/20 1038     LOS: 2 days  Time spent: 32 minutes    Dorcas Carrow, MD [facility] Pager [phone]

## 2020-11-02 LAB — GLUCOSE, CAPILLARY
Glucose-Capillary: 123 mg/dL — ABNORMAL HIGH (ref 70–99)
Glucose-Capillary: 132 mg/dL — ABNORMAL HIGH (ref 70–99)
Glucose-Capillary: 141 mg/dL — ABNORMAL HIGH (ref 70–99)
Glucose-Capillary: 154 mg/dL — ABNORMAL HIGH (ref 70–99)

## 2020-11-02 MED ORDER — FLEET ENEMA 7-19 GM/118ML RE ENEM
1.0000 | ENEMA | Freq: Once | RECTAL | Status: AC
  Administered 2020-11-02: 1 via RECTAL
  Filled 2020-11-02: qty 1

## 2020-11-02 MED ORDER — DICYCLOMINE HCL 10 MG PO CAPS
10.0000 mg | ORAL_CAPSULE | Freq: Three times a day (TID) | ORAL | Status: DC
  Administered 2020-11-02 – 2020-11-04 (×9): 10 mg via ORAL
  Filled 2020-11-02 (×9): qty 1

## 2020-11-02 MED ORDER — METHOCARBAMOL 1000 MG/10ML IJ SOLN
500.0000 mg | Freq: Three times a day (TID) | INTRAVENOUS | Status: DC | PRN
  Administered 2020-11-04: 500 mg via INTRAVENOUS
  Filled 2020-11-02 (×2): qty 5
  Filled 2020-11-02: qty 500

## 2020-11-02 MED ORDER — DOCUSATE SODIUM 100 MG PO CAPS
100.0000 mg | ORAL_CAPSULE | Freq: Two times a day (BID) | ORAL | Status: DC
  Administered 2020-11-02 – 2020-11-03 (×4): 100 mg via ORAL
  Filled 2020-11-02 (×4): qty 1

## 2020-11-02 MED ORDER — KETOROLAC TROMETHAMINE 30 MG/ML IJ SOLN
30.0000 mg | Freq: Four times a day (QID) | INTRAMUSCULAR | Status: DC | PRN
  Administered 2020-11-02 – 2020-11-04 (×5): 30 mg via INTRAVENOUS
  Filled 2020-11-02 (×6): qty 1

## 2020-11-02 NOTE — Progress Notes (Addendum)
Pt has begged for medicine throughout the day "anything that I can get!"  Pt aware that MD plans on referring him to a pain clinic once discharged. Pt questioned this nurse "can I get medical marijuana there?" Instructed to discuss w MD. Noted pt playing games on his phone as he is asking for more medicine.

## 2020-11-02 NOTE — Telephone Encounter (Signed)
Pt currently in ED.

## 2020-11-02 NOTE — Progress Notes (Signed)
PROGRESS NOTE    Anthem Frazer  FXT:[id number] DOB: [birth year] DOA: 10/30/2020 PCP: Claiborne Rigg, NP    Brief Narrative:  60 year old history of DVT/ PE, hyperlipidemia, severe peripheral neuropathy, recurrent pancreatitis and chronic abdominal pain presented with abdominal pain nausea and vomiting.  Ran out of pain medications at home.  Recently discharged from the hospital.  Decreased urine output for last 3 days.  In the emergency room hemodynamically stable.  CT scan abdomen pelvis revealed findings consistent with necrotizing pancreatitis and associated fluid collection slightly decreased than before.  Lipase was normal. Patient has now developed chronic pain issues as well as needing narcotics for pain relief.   Assessment & Plan:   Principal Problem:   Necrotizing pancreatitis Active Problems:   Acute pancreatitis   HLD (hyperlipidemia)   DM type 2 (diabetes mellitus, type 2) (HCC)   History of pulmonary embolus (PE)  Acute on chronic recurrent necrotizing pancreatitis: Patient has developed persistent abdominal pain and has become chronic pain issues.  Without any nausea or vomiting.   Still using significant amount of injectable Dilaudid.  Also with constipation.   Wean off IV Dilaudid, will start on Toradol, muscle relaxants, Bentyl.  N.p.o. except ice chips until needing to use IV narcotics for pain relief.  On PPI.  On Carafate and Creon.   Aggressive bowel regimen today.  We will give 1 dose of Fleet enema, continue MiraLAX.  Type diabetes: Well-controlled.  On glipizide and metformin at home.  Holding.  Currently on insulin.  History of DVT and PE: On Eliquis that he will continue.  Do not anticipate any surgical procedure.  Hyperlipidemia: On Lipitor that continued.   DVT prophylaxis:  apixaban (ELIQUIS) tablet 5 mg   Code Status: Full code Family Communication: None Disposition Plan: Status is: Inpatient  Remains inpatient appropriate because:IV treatments  appropriate due to intensity of illness or inability to take PO and Inpatient level of care appropriate due to severity of illness  Dispo: The patient is from: Home              Anticipated d/c is to: Home              Patient currently is not medically stable to d/c.   Difficult to place patient No         Consultants:  None  Procedures:  None  Antimicrobials:  None   Subjective: Patient seen and examined.  He tells me that he had abdominal distention and pain after taking MiraLAX with water.  Denies any nausea vomiting.  No bowel movement since last 5 days.  Abdomen is bloated.  Feels hungry. We discussed about pain management, discontinuing IV narcotics and patient agreed.  Objective: Vitals:   11/01/20 0608 11/01/20 1354 11/01/20 2147 11/02/20 0527  BP: (!) 146/96 (!) 152/102 (!) 132/93 (!) 139/99  Pulse: 75 83 82 78  Resp: 18 15 18 18   Temp: 97.6 F (36.4 C) 98.1 F (36.7 C) 97.9 F (36.6 C) 97.8 F (36.6 C)  TempSrc: Oral Oral Oral Oral  SpO2: 95% 98% 94% 95%  Weight:      Height:        Intake/Output Summary (Last 24 hours) at 11/02/2020 1348 Last data filed at 11/02/2020 1234 Gross per 24 hour  Intake 4058.14 ml  Output 3050 ml  Net 1008.14 ml   Filed Weights   10/30/20 1807  Weight: 128.6 kg    Examination:  General exam: Appears calm and comfortable at rest.  Respiratory system: Clear to auscultation. Respiratory effort normal.  No added sounds. Cardiovascular system: S1 & S2 heard, RRR.  Gastrointestinal system: Soft.  Obese and pendulous.  Patient does have diffuse tenderness mostly on the epigastric and left upper quadrant.  No rigidity or guarding.   Central nervous system: Alert and oriented. No focal neurological deficits. Extremities: Symmetric 5 x 5 power. Skin: No rashes, lesions or ulcers Psychiatry: Judgement and insight appear normal. Mood & affect appropriate.     Data Reviewed: I have personally reviewed following labs and  imaging studies  CBC: Recent Labs  Lab 10/30/20 1430 10/31/20 0430  WBC 9.9 6.8  HGB 15.4 13.0  HCT 46.0 38.1*  MCV 98.3 96.2  PLT 224 176   Basic Metabolic Panel: Recent Labs  Lab 10/30/20 1430 10/31/20 0430  NA 136 142  K 4.4 3.8  CL 105 112*  CO2 21* 21*  GLUCOSE 202* 134*  BUN 10 8  CREATININE 1.02 0.67  CALCIUM 9.7 9.2   GFR: Estimated Creatinine Clearance: 141.5 mL/min (by C-G formula based on SCr of 0.67 mg/dL). Liver Function Tests: Recent Labs  Lab 10/30/20 1430 10/31/20 0430  AST 25 23  ALT 19 15  ALKPHOS 71 56  BILITOT 0.5 0.6  PROT 7.8 6.3*  ALBUMIN 4.1 3.2*   Recent Labs  Lab 10/30/20 1430  LIPASE 26   No results for input(s): AMMONIA in the last 168 hours. Coagulation Profile: No results for input(s): INR, PROTIME in the last 168 hours. Cardiac Enzymes: No results for input(s): CKTOTAL, CKMB, CKMBINDEX, TROPONINI in the last 168 hours. BNP (last 3 results) No results for input(s): PROBNP in the last 8760 hours. HbA1C: No results for input(s): HGBA1C in the last 72 hours. CBG: Recent Labs  Lab 11/01/20 1149 11/01/20 1737 11/01/20 2357 11/02/20 0525 11/02/20 1125  GLUCAP 134* 116* 117* 132* 123*   Lipid Profile: No results for input(s): CHOL, HDL, LDLCALC, TRIG, CHOLHDL, LDLDIRECT in the last 72 hours. Thyroid Function Tests: No results for input(s): TSH, T4TOTAL, FREET4, T3FREE, THYROIDAB in the last 72 hours. Anemia Panel: No results for input(s): VITAMINB12, FOLATE, FERRITIN, TIBC, IRON, RETICCTPCT in the last 72 hours. Sepsis Labs: No results for input(s): PROCALCITON, LATICACIDVEN in the last 168 hours.  Recent Results (from the past 240 hour(s))  Resp Panel by RT-PCR (Flu A&B, Covid) Nasopharyngeal Swab     Status: None   Collection Time: 10/30/20  5:29 PM   Specimen: Nasopharyngeal Swab; Nasopharyngeal(NP) swabs in vial transport medium  Result Value Ref Range Status   SARS Coronavirus 2 by RT PCR NEGATIVE NEGATIVE  Final    Comment: (NOTE) SARS-CoV-2 target nucleic acids are NOT DETECTED.  The SARS-CoV-2 RNA is generally detectable in upper respiratory specimens during the acute phase of infection. The lowest concentration of SARS-CoV-2 viral copies this assay can detect is 138 copies/mL. A negative result does not preclude SARS-Cov-2 infection and should not be used as the sole basis for treatment or other patient management decisions. A negative result may occur with  improper specimen collection/handling, submission of specimen other than nasopharyngeal swab, presence of viral mutation(s) within the areas targeted by this assay, and inadequate number of viral copies(<138 copies/mL). A negative result must be combined with clinical observations, patient history, and epidemiological information. The expected result is Negative.  Fact Sheet for Patients:  BloggerCourse.com  Fact Sheet for Healthcare Providers:  SeriousBroker.it  This test is no t yet approved or cleared by the [place] and  has been authorized for detection and/or diagnosis of SARS-CoV-2 by FDA under an Emergency Use Authorization (EUA). This EUA will remain  in effect (meaning this test can be used) for the duration of the COVID-19 declaration under Section 564(b)(1) of the Act, 21 U.S.C.section 360bbb-3(b)(1), unless the authorization is terminated  or revoked sooner.       Influenza A by PCR NEGATIVE NEGATIVE Final   Influenza B by PCR NEGATIVE NEGATIVE Final    Comment: (NOTE) The Xpert Xpress SARS-CoV-2/FLU/RSV plus assay is intended as an aid in the diagnosis of influenza from Nasopharyngeal swab specimens and should not be used as a sole basis for treatment. Nasal washings and aspirates are unacceptable for Xpert Xpress SARS-CoV-2/FLU/RSV testing.  Fact Sheet for Patients: BloggerCourse.com  Fact Sheet for Healthcare  Providers: SeriousBroker.it  This test is not yet approved or cleared by the [place] FDA and has been authorized for detection and/or diagnosis of SARS-CoV-2 by FDA under an Emergency Use Authorization (EUA). This EUA will remain in effect (meaning this test can be used) for the duration of the COVID-19 declaration under Section 564(b)(1) of the Act, 21 U.S.C. section 360bbb-3(b)(1), unless the authorization is terminated or revoked.  Performed at [facility], [address] [place], [place] [address]          Radiology Studies: No results found.      Scheduled Meds:  apixaban  5 mg Oral BID   atorvastatin  40 mg Oral Daily   dicyclomine  10 mg Oral TID AC & HS   docusate sodium  100 mg Oral BID   insulin aspart  0-9 Units Subcutaneous Q6H   lipase/protease/amylase  12,000 Units Oral TID AC   pantoprazole  40 mg Oral Daily   sucralfate  1 g Oral TID WC & HS   Continuous Infusions:  lactated ringers Stopped (11/02/20 1227)   methocarbamol (ROBAXIN) IV       LOS: 3 days    Time spent: 32 minutes    Dorcas Carrow, MD [facility] Pager [phone]

## 2020-11-03 LAB — GLUCOSE, CAPILLARY
Glucose-Capillary: 109 mg/dL — ABNORMAL HIGH (ref 70–99)
Glucose-Capillary: 197 mg/dL — ABNORMAL HIGH (ref 70–99)
Glucose-Capillary: 157 mg/dL — ABNORMAL HIGH (ref 70–99)

## 2020-11-03 MED ORDER — LACTULOSE 10 GM/15ML PO SOLN
20.0000 g | Freq: Two times a day (BID) | ORAL | Status: DC
  Administered 2020-11-03 (×2): 20 g via ORAL
  Filled 2020-11-03 (×2): qty 30

## 2020-11-03 NOTE — Progress Notes (Signed)
PROGRESS NOTE    Bryce Mendoza  NOB:[id number] DOB: [birth year] DOA: 10/30/2020 PCP: Claiborne Rigg, NP    Brief Narrative:  61 year old history of DVT/ PE, hyperlipidemia, severe peripheral neuropathy, recurrent pancreatitis and chronic abdominal pain presented with abdominal pain nausea and vomiting.  Ran out of pain medications at home.  Recently discharged from the hospital.  Decreased urine output for last 3 days.  In the emergency room hemodynamically stable.  CT scan abdomen pelvis revealed findings consistent with necrotizing pancreatitis and associated fluid collection slightly decreased than before.  Lipase was normal. Patient has now developed chronic pain issues as well as needing narcotics for pain relief.   Assessment & Plan:   Principal Problem:   Necrotizing pancreatitis Active Problems:   Acute pancreatitis   HLD (hyperlipidemia)   DM type 2 (diabetes mellitus, type 2) (HCC)   History of pulmonary embolus (PE)  Acute on chronic recurrent necrotizing pancreatitis: Patient has developed persistent abdominal pain and has become chronic pain issues.  Without any nausea or vomiting.  Trying to manage pain with oral pain medications and muscle relaxants.  Discontinued IV Dilaudid. Challenged with full liquid diet today. Bowel regimen with lactulose today.  Type 2 diabetes: Well-controlled.  On glipizide and metformin at home.  Holding.  Currently on insulin.  History of DVT and PE: On Eliquis that he will continue.  Do not anticipate any surgical procedure.  Hyperlipidemia: On Lipitor that continued.   DVT prophylaxis:  apixaban (ELIQUIS) tablet 5 mg   Code Status: Full code Family Communication: None Disposition Plan: Status is: Inpatient  Remains inpatient appropriate because:IV treatments appropriate due to intensity of illness or inability to take PO and Inpatient level of care appropriate due to severity of illness  Dispo: The patient is from: Home               Anticipated d/c is to: Home              Patient currently is not medically stable to d/c.   Difficult to place patient No         Consultants:  None  Procedures:  None  Antimicrobials:  None   Subjective: Patient seen and examined.  Tolerating clears.  1 episode of nausea last night but that improved.  He was able to use oral pain medications since last 24 hours.  Fleet Enema with no bowel movements.  Belly feels less bloated today.  Objective: Vitals:   11/01/20 2147 11/02/20 0527 11/02/20 1429 11/02/20 2211  BP: (!) 132/93 (!) 139/99 (!) 147/112 (!) 140/97  Pulse: 82 78 96 81  Resp: 18 18 18 18   Temp: 97.9 F (36.6 C) 97.8 F (36.6 C) 98.9 F (37.2 C) 98.2 F (36.8 C)  TempSrc: Oral Oral Oral Oral  SpO2: 94% 95% 97% 93%  Weight:      Height:        Intake/Output Summary (Last 24 hours) at 11/03/2020 1107 Last data filed at 11/03/2020 1000 Gross per 24 hour  Intake 4769.87 ml  Output 4050 ml  Net 719.87 ml   Filed Weights   10/30/20 1807  Weight: 128.6 kg    Examination:  General exam: Appears calm and comfortable at rest.  Respiratory system: Clear to auscultation. Respiratory effort normal.  No added sounds. Cardiovascular system: S1 & S2 heard, RRR.  Gastrointestinal system: Soft.  Obese and pendulous.  No definite tenderness.   Central nervous system: Alert and oriented. No focal neurological deficits. Extremities: Symmetric  5 x 5 power. Skin: No rashes, lesions or ulcers Psychiatry: Judgement and insight appear normal. Mood & affect appropriate.     Data Reviewed: I have personally reviewed following labs and imaging studies  CBC: Recent Labs  Lab 10/30/20 1430 10/31/20 0430  WBC 9.9 6.8  HGB 15.4 13.0  HCT 46.0 38.1*  MCV 98.3 96.2  PLT 224 176   Basic Metabolic Panel: Recent Labs  Lab 10/30/20 1430 10/31/20 0430  NA 136 142  K 4.4 3.8  CL 105 112*  CO2 21* 21*  GLUCOSE 202* 134*  BUN 10 8  CREATININE 1.02 0.67   CALCIUM 9.7 9.2   GFR: Estimated Creatinine Clearance: 141.5 mL/min (by C-G formula based on SCr of 0.67 mg/dL). Liver Function Tests: Recent Labs  Lab 10/30/20 1430 10/31/20 0430  AST 25 23  ALT 19 15  ALKPHOS 71 56  BILITOT 0.5 0.6  PROT 7.8 6.3*  ALBUMIN 4.1 3.2*   Recent Labs  Lab 10/30/20 1430  LIPASE 26   No results for input(s): AMMONIA in the last 168 hours. Coagulation Profile: No results for input(s): INR, PROTIME in the last 168 hours. Cardiac Enzymes: No results for input(s): CKTOTAL, CKMB, CKMBINDEX, TROPONINI in the last 168 hours. BNP (last 3 results) No results for input(s): PROBNP in the last 8760 hours. HbA1C: No results for input(s): HGBA1C in the last 72 hours. CBG: Recent Labs  Lab 11/02/20 0525 11/02/20 1125 11/02/20 1900 11/02/20 2353 11/03/20 0556  GLUCAP 132* 123* 141* 154* 109*   Lipid Profile: No results for input(s): CHOL, HDL, LDLCALC, TRIG, CHOLHDL, LDLDIRECT in the last 72 hours. Thyroid Function Tests: No results for input(s): TSH, T4TOTAL, FREET4, T3FREE, THYROIDAB in the last 72 hours. Anemia Panel: No results for input(s): VITAMINB12, FOLATE, FERRITIN, TIBC, IRON, RETICCTPCT in the last 72 hours. Sepsis Labs: No results for input(s): PROCALCITON, LATICACIDVEN in the last 168 hours.  Recent Results (from the past 240 hour(s))  Resp Panel by RT-PCR (Flu A&B, Covid) Nasopharyngeal Swab     Status: None   Collection Time: 10/30/20  5:29 PM   Specimen: Nasopharyngeal Swab; Nasopharyngeal(NP) swabs in vial transport medium  Result Value Ref Range Status   SARS Coronavirus 2 by RT PCR NEGATIVE NEGATIVE Final    Comment: (NOTE) SARS-CoV-2 target nucleic acids are NOT DETECTED.  The SARS-CoV-2 RNA is generally detectable in upper respiratory specimens during the acute phase of infection. The lowest concentration of SARS-CoV-2 viral copies this assay can detect is 138 copies/mL. A negative result does not preclude  SARS-Cov-2 infection and should not be used as the sole basis for treatment or other patient management decisions. A negative result may occur with  improper specimen collection/handling, submission of specimen other than nasopharyngeal swab, presence of viral mutation(s) within the areas targeted by this assay, and inadequate number of viral copies(<138 copies/mL). A negative result must be combined with clinical observations, patient history, and epidemiological information. The expected result is Negative.  Fact Sheet for Patients:  BloggerCourse.com  Fact Sheet for Healthcare Providers:  SeriousBroker.it  This test is no t yet approved or cleared by the [place] FDA and  has been authorized for detection and/or diagnosis of SARS-CoV-2 by FDA under an Emergency Use Authorization (EUA). This EUA will remain  in effect (meaning this test can be used) for the duration of the COVID-19 declaration under Section 564(b)(1) of the Act, 21 U.S.C.section 360bbb-3(b)(1), unless the authorization is terminated  or revoked sooner.  Influenza A by PCR NEGATIVE NEGATIVE Final   Influenza B by PCR NEGATIVE NEGATIVE Final    Comment: (NOTE) The Xpert Xpress SARS-CoV-2/FLU/RSV plus assay is intended as an aid in the diagnosis of influenza from Nasopharyngeal swab specimens and should not be used as a sole basis for treatment. Nasal washings and aspirates are unacceptable for Xpert Xpress SARS-CoV-2/FLU/RSV testing.  Fact Sheet for Patients: BloggerCourse.com  Fact Sheet for Healthcare Providers: SeriousBroker.it  This test is not yet approved or cleared by the [place] FDA and has been authorized for detection and/or diagnosis of SARS-CoV-2 by FDA under an Emergency Use Authorization (EUA). This EUA will remain in effect (meaning this test can be used) for the duration of  the COVID-19 declaration under Section 564(b)(1) of the Act, 21 U.S.C. section 360bbb-3(b)(1), unless the authorization is terminated or revoked.  Performed at [facility], [address], [place], [place] [address]          Radiology Studies: No results found.      Scheduled Meds:  apixaban  5 mg Oral BID   atorvastatin  40 mg Oral Daily   dicyclomine  10 mg Oral TID AC & HS   docusate sodium  100 mg Oral BID   insulin aspart  0-9 Units Subcutaneous Q6H   lactulose  20 g Oral BID   lipase/protease/amylase  12,000 Units Oral TID AC   pantoprazole  40 mg Oral Daily   sucralfate  1 g Oral TID WC & HS   Continuous Infusions:  lactated ringers 75 mL/hr at 11/03/20 0908   methocarbamol (ROBAXIN) IV       LOS: 4 days    Time spent: 25 minutes    Dorcas Carrow, MD [facility] Pager [phone]

## 2020-11-04 LAB — GLUCOSE, CAPILLARY
Glucose-Capillary: 154 mg/dL — ABNORMAL HIGH (ref 70–99)
Glucose-Capillary: 162 mg/dL — ABNORMAL HIGH (ref 70–99)

## 2020-11-04 MED ORDER — HYDROCODONE-ACETAMINOPHEN 10-325 MG PO TABS: 1.0000 | ORAL_TABLET | Freq: Four times a day (QID) | ORAL | 0 refills | Status: AC | PRN

## 2020-11-04 MED ORDER — DICYCLOMINE HCL 10 MG PO CAPS: 10.0000 mg | ORAL_CAPSULE | Freq: Three times a day (TID) | ORAL | 0 refills | Status: DC

## 2020-11-04 MED ORDER — METHOCARBAMOL 500 MG PO TABS: 500.0000 mg | ORAL_TABLET | Freq: Three times a day (TID) | ORAL | 0 refills | Status: DC | PRN

## 2020-11-04 MED ORDER — LORAZEPAM 1 MG PO TABS: 1.0000 mg | ORAL_TABLET | Freq: Two times a day (BID) | ORAL | 0 refills | Status: DC | PRN

## 2020-11-04 NOTE — Plan of Care (Signed)

## 2020-11-04 NOTE — Discharge Summary (Signed)
Physician Discharge Summary  Bryce Mendoza BJY:[id number] DOB: 01-Jun-1960 DOA: 10/30/2020  PCP: [name], NP  Admit date: 10/30/2020 Discharge date: 11/04/2020  Admitted From: Home Disposition: Home  Recommendations for Outpatient Follow-up:  Follow up with PCP in 1-2 weeks Please obtain BMP/CBC/magnesium/phosphorus in one week Will send referral to pain management clinic.  Home Health: Not applicable Equipment/Devices: Not applicable  Discharge Condition: Stable CODE STATUS: Full code Diet recommendation: Low-salt, low-carb and low-fat diet  Discharge summary:  Because of ongoing pain issues and probable chronic pain issues, patient is very high risk for readmission.  60 year old gentleman with history of DVT/PE on Eliquis, severe peripheral neuropathy, recurrent pancreatitis and chronic abdominal pain presented to the emergency room with abdominal pain, nausea and vomiting after running out of pain medications at home.  Recent 2 hospitalizations for similar complaints.  First extensive hospital stay with necrotizing pancreatitis and came back with pain and unable to tolerate oral intake. In the emergency room hemodynamically stable.  Lipase normal.  CT scan with necrotizing pancreatitis with no acute findings and improving edema.  Admitted and treated with IV fluids, symptomatic treatment with IV and oral opiates.  Now tolerating regular diet and pain managed with oral pain medications. Symptoms improved with laxatives after having bowel movements. Discharged home with short course of Norco, Robaxin, Bentyl.  Trying to avoid NSAIDs with use of Eliquis. Blood sugars are stable on glipizide and metformin that he will continue.  Patient will have chronic pain issues and will need chronic pain management.  A referral was made to pain management clinic and hopefully he can be gradually taper off the pain medications. Patient was also given short course of Ativan prescription to help  with insomnia and nausea.    Discharge Diagnoses:  Principal Problem:   Necrotizing pancreatitis Active Problems:   Acute pancreatitis   HLD (hyperlipidemia)   DM type 2 (diabetes mellitus, type 2) (HCC)   History of pulmonary embolus (PE)    Discharge Instructions  Discharge Instructions     Ambulatory referral to Pain Clinic   Complete by: As directed    Call MD for:  difficulty breathing, headache or visual disturbances   Complete by: As directed    Call MD for:  severe uncontrolled pain   Complete by: As directed    Diet - low sodium heart healthy   Complete by: As directed    Diet Carb Modified   Complete by: As directed    Increase activity slowly   Complete by: As directed       Allergies as of 11/04/2020   No Known Allergies      Medication List     STOP taking these medications    acetaminophen-codeine 300-30 MG tablet Commonly known as: TYLENOL #3   Ensure Max Protein Liqd   metoCLOPramide 10 MG/10ML Soln Commonly known as: REGLAN   zolpidem 5 MG tablet Commonly known as: AMBIEN       TAKE these medications    Accu-Chek Guide test strip Generic drug: glucose blood Check blood sugar once daily.   atorvastatin 40 MG tablet Commonly known as: LIPITOR Take 1 tablet (40 mg total) by mouth daily.   blood glucose meter kit and supplies Kit Dispense based on patient and insurance preference. Use up to four times daily as directed.   dicyclomine 10 MG capsule Commonly known as: BENTYL Take 1 capsule (10 mg total) by mouth 3 (three) times daily before meals. What changed: when to take this  Eliquis 5 MG Tabs tablet Generic drug: apixaban Take 1 tablet (5 mg total) by mouth 2 (two) times daily.   FreeStyle Libre 14 Day Reader Kerrin Mo Use as instructed. Check blood glucose level by fingerstick 3times per day. E11.65 G61.81 G60.9   FreeStyle Libre 14 Day Sensor Misc Use as instructed. Check blood glucose level by fingerstick 3times per day.  E11.65 G61.81 G60.9   glipiZIDE 5 MG tablet Commonly known as: GLUCOTROL Take 1 tablet (5 mg total) by mouth daily before breakfast.   HYDROcodone-acetaminophen 10-325 MG tablet Commonly known as: NORCO Take 1 tablet by mouth every 6 (six) hours as needed for up to 8 days for moderate pain.   hydrOXYzine 25 MG capsule Commonly known as: VISTARIL Take 1 capsule (25 mg total) by mouth every 8 (eight) hours as needed. What changed: reasons to take this   lipase/protease/amylase 12000-38000 units Cpep capsule Commonly known as: CREON Take 1 capsule (12,000 Units total) by mouth 3 (three) times daily before meals.   LORazepam 1 MG tablet Commonly known as: ATIVAN Take 1 tablet (1 mg total) by mouth every 12 (twelve) hours as needed for anxiety.   metFORMIN 500 MG 24 hr tablet Commonly known as: Glucophage XR Take 1 tablet (500 mg total) by mouth 2 (two) times daily.   methocarbamol 500 MG tablet Commonly known as: ROBAXIN Take 1 tablet (500 mg total) by mouth every 8 (eight) hours as needed for up to 14 days for muscle spasms. What changed:  when to take this reasons to take this   multivitamin tablet Take 1 tablet by mouth daily.   nortriptyline 10 MG capsule Commonly known as: PAMELOR TAKE 1 CAPSULE BY MOUTH DAILY and  TAKE 2 capsules by mouth AT BEDTIME FOR NEUROPATHY What changed:  how much to take how to take this when to take this additional instructions   pantoprazole 40 MG tablet Commonly known as: PROTONIX Take 1 tablet (40 mg total) by mouth daily.   sucralfate 1 g tablet Commonly known as: CARAFATE Take 1 tablet (1 g total) by mouth 4 (four) times daily -  with meals and at bedtime.   traZODone 50 MG tablet Commonly known as: DESYREL Take 1-2 tablets (50-100 mg total) by mouth at bedtime as needed for sleep. What changed: how much to take        No Known Allergies  Consultations: None   Procedures/Studies: CT ABDOMEN PELVIS W  CONTRAST  Result Date: 10/30/2020 CLINICAL DATA:  Acute generalized abdominal pain, emesis. EXAM: CT ABDOMEN AND PELVIS WITH CONTRAST TECHNIQUE: Multidetector CT imaging of the abdomen and pelvis was performed using the standard protocol following bolus administration of intravenous contrast. CONTRAST:  42m OMNIPAQUE IOHEXOL 350 MG/ML SOLN COMPARISON:  October 17, 2020. FINDINGS: Lower chest: Minimal bibasilar subsegmental atelectasis is noted. Hepatobiliary: No gallstones or biliary dilatation is noted. Hepatic steatosis. Pancreas: Findings consistent with necrotizing pancreatitis involving pancreatic body and tail. Associated fluid collection measures approximately 11.8 x 3.5 cm which is slightly decreased compared to prior exam. 3.5 x 2.2 cm fluid collection is seen inferior and anterior to pancreatic head consistent with pseudocyst which is slightly decreased compared to prior exam. Spleen: Normal in size without focal abnormality. Adrenals/Urinary Tract: Adrenal glands are unremarkable. Kidneys are normal, without renal calculi, focal lesion, or hydronephrosis. Bladder is unremarkable. Stomach/Bowel: The stomach appears normal. There is no evidence of bowel obstruction or inflammation. Vascular/Lymphatic: Aortic atherosclerosis. No enlarged abdominal or pelvic lymph nodes. Reproductive: Prostate is unremarkable. Other:  No abdominal wall hernia or abnormality. No abdominopelvic ascites. Musculoskeletal: No acute or significant osseous findings. IMPRESSION: Findings consistent with necrotizing pancreatitis involving the pancreatic body and tail. Associated fluid collection is slightly decreased compared to prior exam. Probable pseudocyst is seen inferior and anterior to the pancreatic head which is also slightly decreased compared to prior exam. Hepatic steatosis. Aortic Atherosclerosis (ICD10-I70.0). Electronically Signed   By: Marijo Conception M.D.   On: 10/30/2020 16:57   CT Abdomen Pelvis W  Contrast  Result Date: 10/17/2020 CLINICAL DATA:  Diffuse abdominal pain.  History of pancreatitis EXAM: CT ABDOMEN AND PELVIS WITH CONTRAST TECHNIQUE: Multidetector CT imaging of the abdomen and pelvis was performed using the standard protocol following bolus administration of intravenous contrast. CONTRAST:  4m OMNIPAQUE IOHEXOL 350 MG/ML SOLN COMPARISON:  10/05/2020 FINDINGS: Lower chest: Bibasilar atelectasis.  Heart size is normal. Hepatobiliary: Diffusely decreased attenuation of the hepatic parenchyma. No focal liver lesion identified. Unremarkable gallbladder. No hyperdense gallstone. No intrahepatic biliary dilatation. Pancreas: Inflammatory stranding with a small amount of free fluid adjacent to the pancreatic body and tail. Similar appearance of hypoenhancement of the distal pancreatic body and tail with surrounding fluid. Partial peripheral enhancement of fluid surrounding the pancreatic body and tail, measuring approximately 13.6 x 4.5 x 4.7 cm. Fluid collection along the anterior aspect of the pancreatic head measures approximately 5.0 x 2.8 x 3.9 cm (previously measured approximately 6.2 x 3.2 x 4.4 cm on 10/05/2020). Spleen: Normal in size without focal abnormality. Adrenals/Urinary Tract: Unremarkable adrenal glands. Kidneys enhance symmetrically without focal lesion, stone, or hydronephrosis. Ureters are nondilated. Urinary bladder appears unremarkable for the degree of distension. Stomach/Bowel: Stomach is within normal limits. Appendix not visualized. No evidence of bowel wall thickening, distention, or inflammatory changes. Vascular/Lymphatic: Scattered aortoiliac atherosclerotic calcifications without aneurysm. Multiple mildly prominent upper abdominal lymph nodes are stable from prior and likely reactive. No intrapelvic adenopathy. Reproductive: Prostate is unremarkable. Other: No pneumoperitoneum.  Small fat containing umbilical hernia. Musculoskeletal: No acute or significant osseous  findings. Degenerative disc disease of L5-S1. IMPRESSION: 1. Continued evolution of acute necrotizing pancreatitis with similar appearance of hypoenhancement of the distal pancreatic body and tail with surrounding fluid. Progressive partial peripheral enhancement of fluid surrounding the pancreatic body and tail suggestive of developing pseudocyst. 2. Slight interval decrease in size of pseudocyst along the anterior aspect of the pancreatic head measuring up to 5.0 cm (previously 6.2 cm on 10/05/2020). 3. Hepatic steatosis. 4. Aortic atherosclerosis (ICD10-I70.0). Electronically Signed   By: NDavina PokeD.O.   On: 10/17/2020 15:53   CT ABDOMEN PELVIS W CONTRAST  Result Date: 10/05/2020 CLINICAL DATA:  Pancreatitis, persistent abdominal pain and abdominal distension EXAM: CT ABDOMEN AND PELVIS WITH CONTRAST TECHNIQUE: Multidetector CT imaging of the abdomen and pelvis was performed using the standard protocol following bolus administration of intravenous contrast. CONTRAST:  725mOMNIPAQUE IOHEXOL 350 MG/ML SOLN COMPARISON:  09/25/2020 FINDINGS: Lower chest: No acute abnormality. Trace left pleural effusion associated atelectasis or consolidation, unchanged. Coronary artery calcifications. Hepatobiliary: No solid liver abnormality is seen. Hepatic steatosis. No gallstones, gallbladder wall thickening, or biliary dilatation. Pancreas: Redemonstrated diffuse inflammatory fat stranding and fluid about the pancreas. There is a new fluid collection anterior to the pancreatic head and uncinate measuring 4.4 x 3.3 cm (series 2, image 41). There is redemonstrated hypoenhancement of the distal pancreatic body and tail (series 2, image 33) with surrounding fluid. Spleen: Normal in size without significant abnormality. Adrenals/Urinary Tract: Adrenal glands are unremarkable. Kidneys are normal,  without renal calculi, solid lesion, or hydronephrosis. Bladder is unremarkable. Stomach/Bowel: Stomach is within normal  limits. Appendix is not clearly visualized and may be surgically absent. No evidence of bowel wall thickening, distention, or inflammatory changes. Vascular/Lymphatic: Aortic atherosclerosis. No enlarged abdominal or pelvic lymph nodes. Reproductive: No mass or other significant abnormality. Other: No abdominal wall hernia or abnormality. No abdominopelvic ascites. Musculoskeletal: No acute or significant osseous findings. IMPRESSION: 1. Redemonstrated diffuse inflammatory fat stranding and fluid about the pancreas, consistent with acute pancreatitis. 2. There is a new fluid collection anterior to the pancreatic head and uncinate measuring 4.4 x 3.3 cm, consistent with acute pancreatic fluid collection. 3. There is redemonstrated hypoenhancement of the distal pancreatic body and tail with surrounding fluid, findings consistent with pancreatic parenchymal necrosis, which is similar in appearance to prior examination. 4. Hepatic steatosis. 5. Trace left pleural effusion associated atelectasis or consolidation, unchanged. 6. Coronary artery disease. Aortic Atherosclerosis (ICD10-I70.0). Electronically Signed   By: Eddie Candle M.D.   On: 10/05/2020 14:49   (Echo, Carotid, EGD, Colonoscopy, ERCP)    Subjective: Patient seen and examined.  He has not used injection since yesterday.  He ate regular diet last night.  After use of lactulose and prune juice he had multiple bowel movements.  He thinks he can manage his symptoms with pills.  He is comfortable to go home.   Discharge Exam: Vitals:   11/03/20 2131 11/04/20 0608  BP: (!) 140/105 (!) 154/104  Pulse: 74 74  Resp: 16 16  Temp: 98.6 F (37 C) (!) 97.5 F (36.4 C)  SpO2: 98% 97%   Vitals:   11/02/20 2211 11/03/20 1258 11/03/20 2131 11/04/20 0608  BP: (!) 140/97 (!) 140/97 (!) 140/105 (!) 154/104  Pulse: 81 82 74 74  Resp: '18 18 16 16  ' Temp: 98.2 F (36.8 C) 97.6 F (36.4 C) 98.6 F (37 C) (!) 97.5 F (36.4 C)  TempSrc: Oral  Oral Oral   SpO2: 93% 97% 98% 97%  Weight:      Height:        General: Pt is alert, awake, not in acute distress Cardiovascular: RRR, S1/S2 +, no rubs, no gallops Respiratory: CTA bilaterally, no wheezing, no rhonchi Abdominal: Soft, NT, ND, bowel sounds +, he does not have any point tenderness or rigidity or guarding. Extremities: no edema, no cyanosis    The results of significant diagnostics from this hospitalization (including imaging, microbiology, ancillary and laboratory) are listed below for reference.     Microbiology: Recent Results (from the past 240 hour(s))  Resp Panel by RT-PCR (Flu A&B, Covid) Nasopharyngeal Swab     Status: None   Collection Time: 10/30/20  5:29 PM   Specimen: Nasopharyngeal Swab; Nasopharyngeal(NP) swabs in vial transport medium  Result Value Ref Range Status   SARS Coronavirus 2 by RT PCR NEGATIVE NEGATIVE Final    Comment: (NOTE) SARS-CoV-2 target nucleic acids are NOT DETECTED.  The SARS-CoV-2 RNA is generally detectable in upper respiratory specimens during the acute phase of infection. The lowest concentration of SARS-CoV-2 viral copies this assay can detect is 138 copies/mL. A negative result does not preclude SARS-Cov-2 infection and should not be used as the sole basis for treatment or other patient management decisions. A negative result may occur with  improper specimen collection/handling, submission of specimen other than nasopharyngeal swab, presence of viral mutation(s) within the areas targeted by this assay, and inadequate number of viral copies(<138 copies/mL). A negative result must be combined with clinical  observations, patient history, and epidemiological information. The expected result is Negative.  Fact Sheet for Patients:  EntrepreneurPulse.com.au  Fact Sheet for Healthcare Providers:  IncredibleEmployment.be  This test is no t yet approved or cleared by the [place] FDA and  has  been authorized for detection and/or diagnosis of SARS-CoV-2 by FDA under an Emergency Use Authorization (EUA). This EUA will remain  in effect (meaning this test can be used) for the duration of the COVID-19 declaration under Section 564(b)(1) of the Act, 21 U.S.C.section 360bbb-3(b)(1), unless the authorization is terminated  or revoked sooner.       Influenza A by PCR NEGATIVE NEGATIVE Final   Influenza B by PCR NEGATIVE NEGATIVE Final    Comment: (NOTE) The Xpert Xpress SARS-CoV-2/FLU/RSV plus assay is intended as an aid in the diagnosis of influenza from Nasopharyngeal swab specimens and should not be used as a sole basis for treatment. Nasal washings and aspirates are unacceptable for Xpert Xpress SARS-CoV-2/FLU/RSV testing.  Fact Sheet for Patients: EntrepreneurPulse.com.au  Fact Sheet for Healthcare Providers: IncredibleEmployment.be  This test is not yet approved or cleared by the [place] FDA and has been authorized for detection and/or diagnosis of SARS-CoV-2 by FDA under an Emergency Use Authorization (EUA). This EUA will remain in effect (meaning this test can be used) for the duration of the COVID-19 declaration under Section 564(b)(1) of the Act, 21 U.S.C. section 360bbb-3(b)(1), unless the authorization is terminated or revoked.  Performed at [facility], [place] [address] [place], [place] [address]      Labs: BNP (last 3 results) No results for input(s): BNP in the last 8760 hours. Basic Metabolic Panel: Recent Labs  Lab 10/30/20 1430 10/31/20 0430  NA 136 142  K 4.4 3.8  CL 105 112*  CO2 21* 21*  GLUCOSE 202* 134*  BUN 10 8  CREATININE 1.02 0.67  CALCIUM 9.7 9.2   Liver Function Tests: Recent Labs  Lab 10/30/20 1430 10/31/20 0430  AST 25 23  ALT 19 15  ALKPHOS 71 56  BILITOT 0.5 0.6  PROT 7.8 6.3*  ALBUMIN 4.1 3.2*   Recent Labs  Lab 10/30/20 1430  LIPASE 26   No results  for input(s): AMMONIA in the last 168 hours. CBC: Recent Labs  Lab 10/30/20 1430 10/31/20 0430  WBC 9.9 6.8  HGB 15.4 13.0  HCT 46.0 38.1*  MCV 98.3 96.2  PLT 224 176   Cardiac Enzymes: No results for input(s): CKTOTAL, CKMB, CKMBINDEX, TROPONINI in the last 168 hours. BNP: Invalid input(s): POCBNP CBG: Recent Labs  Lab 11/03/20 0556 11/03/20 1117 11/03/20 1750 11/04/20 0014 11/04/20 0605  GLUCAP 109* 197* 157* 162* 154*   D-Dimer No results for input(s): DDIMER in the last 72 hours. Hgb A1c No results for input(s): HGBA1C in the last 72 hours. Lipid Profile No results for input(s): CHOL, HDL, LDLCALC, TRIG, CHOLHDL, LDLDIRECT in the last 72 hours. Thyroid function studies No results for input(s): TSH, T4TOTAL, T3FREE, THYROIDAB in the last 72 hours.  Invalid input(s): FREET3 Anemia work up No results for input(s): VITAMINB12, FOLATE, FERRITIN, TIBC, IRON, RETICCTPCT in the last 72 hours. Urinalysis    Component Value Date/Time   COLORURINE YELLOW 10/30/2020 1950   APPEARANCEUR CLEAR 10/30/2020 1950   LABSPEC >1.046 (H) 10/30/2020 1950   PHURINE 5.0 10/30/2020 1950   GLUCOSEU NEGATIVE 10/30/2020 1950   HGBUR NEGATIVE 10/30/2020 1950   BILIRUBINUR NEGATIVE 10/30/2020 1950   KETONESUR 5 (A) 10/30/2020 1950   PROTEINUR NEGATIVE  10/30/2020 1950   NITRITE NEGATIVE 10/30/2020 1950   LEUKOCYTESUR NEGATIVE 10/30/2020 1950   Sepsis Labs Invalid input(s): PROCALCITONIN,  WBC,  LACTICIDVEN Microbiology Recent Results (from the past 240 hour(s))  Resp Panel by RT-PCR (Flu A&B, Covid) Nasopharyngeal Swab     Status: None   Collection Time: 10/30/20  5:29 PM   Specimen: Nasopharyngeal Swab; Nasopharyngeal(NP) swabs in vial transport medium  Result Value Ref Range Status   SARS Coronavirus 2 by RT PCR NEGATIVE NEGATIVE Final    Comment: (NOTE) SARS-CoV-2 target nucleic acids are NOT DETECTED.  The SARS-CoV-2 RNA is generally detectable in upper respiratory specimens  during the acute phase of infection. The lowest concentration of SARS-CoV-2 viral copies this assay can detect is 138 copies/mL. A negative result does not preclude SARS-Cov-2 infection and should not be used as the sole basis for treatment or other patient management decisions. A negative result may occur with  improper specimen collection/handling, submission of specimen other than nasopharyngeal swab, presence of viral mutation(s) within the areas targeted by this assay, and inadequate number of viral copies(<138 copies/mL). A negative result must be combined with clinical observations, patient history, and epidemiological information. The expected result is Negative.  Fact Sheet for Patients:  EntrepreneurPulse.com.au  Fact Sheet for Healthcare Providers:  IncredibleEmployment.be  This test is no t yet approved or cleared by the [place] FDA and  has been authorized for detection and/or diagnosis of SARS-CoV-2 by FDA under an Emergency Use Authorization (EUA). This EUA will remain  in effect (meaning this test can be used) for the duration of the COVID-19 declaration under Section 564(b)(1) of the Act, 21 U.S.C.section 360bbb-3(b)(1), unless the authorization is terminated  or revoked sooner.       Influenza A by PCR NEGATIVE NEGATIVE Final   Influenza B by PCR NEGATIVE NEGATIVE Final    Comment: (NOTE) The Xpert Xpress SARS-CoV-2/FLU/RSV plus assay is intended as an aid in the diagnosis of influenza from Nasopharyngeal swab specimens and should not be used as a sole basis for treatment. Nasal washings and aspirates are unacceptable for Xpert Xpress SARS-CoV-2/FLU/RSV testing.  Fact Sheet for Patients: EntrepreneurPulse.com.au  Fact Sheet for Healthcare Providers: IncredibleEmployment.be  This test is not yet approved or cleared by the [place] FDA and has been authorized for detection  and/or diagnosis of SARS-CoV-2 by FDA under an Emergency Use Authorization (EUA). This EUA will remain in effect (meaning this test can be used) for the duration of the COVID-19 declaration under Section 564(b)(1) of the Act, 21 U.S.C. section 360bbb-3(b)(1), unless the authorization is terminated or revoked.  Performed at [facility], [place] [address] [place], [place] [address]      Time coordinating discharge:  35 minutes  SIGNED:   Barb Merino, MD  [facility] 11/04/2020, 11:15 AM

## 2020-11-06 ENCOUNTER — Telehealth: Payer: Self-pay

## 2020-11-06 NOTE — Telephone Encounter (Signed)
Transition Care Management Follow-up Telephone Call Date of discharge and from where: 11/04/2020, [facility]  How have you been since you were released from the hospital? He said he is doing good, sleeping a lot  Any questions or concerns? No  Items Reviewed: Did the pt receive and understand the discharge instructions provided? Yes  Medications obtained and verified? Yes - he said he has all medications and did not have any questions about his med regime.  Other? No  Any new allergies since your discharge? No  Dietary orders reviewed? No Do you have support at home?  Lives alone   Home Care and Equipment/Supplies: Were home health services ordered? no If so, what is the name of the agency? N/a  Has the agency set up a time to come to the patient's home? not applicable Were any new equipment or medical supplies ordered?  Yes: Freestyle Libre What is the name of the medical supply agency? [facility] Were you able to get the supplies/equipment? He has the reader but needs to pick up the sensors and hopes to get them today.  Do you have any questions related to the use of the equipment or supplies? No  Functional Questionnaire: (I = Independent and D = Dependent) ADLs: independent. Has walker to use as needed    Follow up appointments reviewed:  PCP Hospital f/u appt confirmed? Yes  Scheduled to see [name], NP on 12/12/2020. He did not want to schedule an appointment to be seen sooner Specialist Hospital f/u appt confirmed? Yes  Scheduled to see neurology on 11/20/2020.   Are transportation arrangements needed? No - he said that he has [place] rides. Informed him that he should contact his insurance company as they usually provide rides to medical appointment.s If their condition worsens, is the pt aware to call PCP or go to the Emergency Dept.? Yes Was the patient provided with contact information for the PCP's office or ED? Yes Was to pt encouraged to call back with questions or  concerns? Yes

## 2020-11-08 ENCOUNTER — Encounter: Payer: Self-pay | Admitting: Physical Medicine and Rehabilitation

## 2020-11-15 ENCOUNTER — Inpatient Hospital Stay (HOSPITAL_COMMUNITY)
Admission: EM | Admit: 2020-11-15 | Discharge: 2020-11-17 | DRG: 641 | Disposition: A | Discharge status: Home / Self Care | Payer: Medicaid Other | Attending: Internal Medicine | Admitting: Internal Medicine

## 2020-11-15 ENCOUNTER — Other Ambulatory Visit: Payer: Self-pay

## 2020-11-15 ENCOUNTER — Encounter (HOSPITAL_COMMUNITY): Payer: Self-pay | Admitting: Internal Medicine

## 2020-11-15 ENCOUNTER — Emergency Department (HOSPITAL_COMMUNITY): Payer: Medicaid Other

## 2020-11-15 DIAGNOSIS — E872 Acidosis: Principal | ICD-10-CM | POA: Diagnosis present

## 2020-11-15 DIAGNOSIS — R112 Nausea with vomiting, unspecified: Secondary | ICD-10-CM | POA: Diagnosis not present

## 2020-11-15 DIAGNOSIS — K861 Other chronic pancreatitis: Secondary | ICD-10-CM | POA: Diagnosis present

## 2020-11-15 DIAGNOSIS — Z807 Family history of other malignant neoplasms of lymphoid, hematopoietic and related tissues: Secondary | ICD-10-CM

## 2020-11-15 DIAGNOSIS — F1010 Alcohol abuse, uncomplicated: Secondary | ICD-10-CM | POA: Diagnosis present

## 2020-11-15 DIAGNOSIS — Z7901 Long term (current) use of anticoagulants: Secondary | ICD-10-CM

## 2020-11-15 DIAGNOSIS — K859 Acute pancreatitis without necrosis or infection, unspecified: Secondary | ICD-10-CM | POA: Diagnosis not present

## 2020-11-15 DIAGNOSIS — F17210 Nicotine dependence, cigarettes, uncomplicated: Secondary | ICD-10-CM | POA: Diagnosis present

## 2020-11-15 DIAGNOSIS — K760 Fatty (change of) liver, not elsewhere classified: Secondary | ICD-10-CM | POA: Diagnosis not present

## 2020-11-15 DIAGNOSIS — R7401 Elevation of levels of liver transaminase levels: Secondary | ICD-10-CM

## 2020-11-15 DIAGNOSIS — I10 Essential (primary) hypertension: Secondary | ICD-10-CM | POA: Diagnosis present

## 2020-11-15 DIAGNOSIS — Z86711 Personal history of pulmonary embolism: Secondary | ICD-10-CM

## 2020-11-15 DIAGNOSIS — E785 Hyperlipidemia, unspecified: Secondary | ICD-10-CM | POA: Diagnosis present

## 2020-11-15 DIAGNOSIS — Z66 Do not resuscitate: Secondary | ICD-10-CM | POA: Diagnosis present

## 2020-11-15 DIAGNOSIS — Z86718 Personal history of other venous thrombosis and embolism: Secondary | ICD-10-CM

## 2020-11-15 DIAGNOSIS — T730XXA Starvation, initial encounter: Secondary | ICD-10-CM | POA: Diagnosis present

## 2020-11-15 DIAGNOSIS — Z20822 Contact with and (suspected) exposure to covid-19: Secondary | ICD-10-CM | POA: Diagnosis present

## 2020-11-15 DIAGNOSIS — Z8719 Personal history of other diseases of the digestive system: Secondary | ICD-10-CM | POA: Diagnosis not present

## 2020-11-15 DIAGNOSIS — E119 Type 2 diabetes mellitus without complications: Secondary | ICD-10-CM | POA: Diagnosis present

## 2020-11-15 DIAGNOSIS — E8729 Other acidosis: Secondary | ICD-10-CM | POA: Diagnosis present

## 2020-11-15 DIAGNOSIS — R030 Elevated blood-pressure reading, without diagnosis of hypertension: Secondary | ICD-10-CM | POA: Diagnosis present

## 2020-11-15 DIAGNOSIS — G8929 Other chronic pain: Secondary | ICD-10-CM | POA: Diagnosis present

## 2020-11-15 DIAGNOSIS — R109 Unspecified abdominal pain: Secondary | ICD-10-CM | POA: Diagnosis not present

## 2020-11-15 LAB — CBC WITH DIFFERENTIAL/PLATELET
Abs Immature Granulocytes: 0.02 10*3/uL (ref 0.00–0.07)
Basophils Absolute: 0.1 10*3/uL (ref 0.0–0.1)
Basophils Relative: 1 %
Eosinophils Absolute: 0 10*3/uL (ref 0.0–0.5)
Eosinophils Relative: 0 %
HCT: 44.4 % (ref 39.0–52.0)
Hemoglobin: 15.1 g/dL (ref 13.0–17.0)
Immature Granulocytes: 0 %
Lymphocytes Relative: 33 %
Lymphs Abs: 3.5 10*3/uL (ref 0.7–4.0)
MCH: 31.7 pg (ref 26.0–34.0)
MCHC: 34 g/dL (ref 30.0–36.0)
MCV: 93.1 fL (ref 80.0–100.0)
Monocytes Absolute: 0.8 10*3/uL (ref 0.1–1.0)
Monocytes Relative: 8 %
Neutro Abs: 6.3 10*3/uL (ref 1.7–7.7)
Neutrophils Relative %: 58 %
Platelets: 263 10*3/uL (ref 150–400)
RBC: 4.77 MIL/uL (ref 4.22–5.81)
RDW: 12.9 % (ref 11.5–15.5)
WBC: 10.7 10*3/uL — ABNORMAL HIGH (ref 4.0–10.5)
nRBC: 0 % (ref 0.0–0.2)

## 2020-11-15 LAB — COMPREHENSIVE METABOLIC PANEL
ALT: 61 U/L — ABNORMAL HIGH (ref 0–44)
AST: 135 U/L — ABNORMAL HIGH (ref 15–41)
Albumin: 4.4 g/dL (ref 3.5–5.0)
Alkaline Phosphatase: 79 U/L (ref 38–126)
Anion gap: 20 — ABNORMAL HIGH (ref 5–15)
BUN: 10 mg/dL (ref 6–20)
CO2: 14 mmol/L — ABNORMAL LOW (ref 22–32)
Calcium: 9.8 mg/dL (ref 8.9–10.3)
Chloride: 109 mmol/L (ref 98–111)
Creatinine, Ser: 0.81 mg/dL (ref 0.61–1.24)
GFR, Estimated: >60 mL/min (ref >60)
Glucose, Bld: 152 mg/dL — ABNORMAL HIGH (ref 70–99)
Potassium: 3.8 mmol/L (ref 3.5–5.1)
Sodium: 143 mmol/L (ref 135–145)
Total Bilirubin: 1.3 mg/dL — ABNORMAL HIGH (ref 0.3–1.2)
Total Protein: 8.2 g/dL — ABNORMAL HIGH (ref 6.5–8.1)

## 2020-11-15 LAB — URINALYSIS, ROUTINE W REFLEX MICROSCOPIC
Bilirubin Urine: NEGATIVE
Glucose, UA: NEGATIVE mg/dL
Hgb urine dipstick: NEGATIVE
Ketones, ur: 80 mg/dL — AB
Leukocytes,Ua: NEGATIVE
Nitrite: NEGATIVE
Protein, ur: 30 mg/dL — AB
Specific Gravity, Urine: 1.016 (ref 1.005–1.030)
pH: 5 (ref 5.0–8.0)

## 2020-11-15 LAB — RENAL FUNCTION PANEL
Albumin: 3.7 g/dL (ref 3.5–5.0)
Albumin: 3.6 g/dL (ref 3.5–5.0)
Anion gap: 15 (ref 5–15)
Anion gap: 15 (ref 5–15)
BUN: 10 mg/dL (ref 6–20)
BUN: 10 mg/dL (ref 6–20)
CO2: 15 mmol/L — ABNORMAL LOW (ref 22–32)
CO2: 21 mmol/L — ABNORMAL LOW (ref 22–32)
Calcium: 9.1 mg/dL (ref 8.9–10.3)
Calcium: 8.9 mg/dL (ref 8.9–10.3)
Chloride: 109 mmol/L (ref 98–111)
Chloride: 104 mmol/L (ref 98–111)
Creatinine, Ser: 0.8 mg/dL (ref 0.61–1.24)
Creatinine, Ser: 0.88 mg/dL (ref 0.61–1.24)
GFR, Estimated: >60 mL/min (ref >60)
GFR, Estimated: >60 mL/min (ref >60)
Glucose, Bld: 155 mg/dL — ABNORMAL HIGH (ref 70–99)
Glucose, Bld: 112 mg/dL — ABNORMAL HIGH (ref 70–99)
Phosphorus: 3.2 mg/dL (ref 2.5–4.6)
Phosphorus: 2.4 mg/dL — ABNORMAL LOW (ref 2.5–4.6)
Potassium: 3.6 mmol/L (ref 3.5–5.1)
Potassium: 3.2 mmol/L — ABNORMAL LOW (ref 3.5–5.1)
Sodium: 139 mmol/L (ref 135–145)
Sodium: 140 mmol/L (ref 135–145)

## 2020-11-15 LAB — GLUCOSE, CAPILLARY
Glucose-Capillary: 140 mg/dL — ABNORMAL HIGH (ref 70–99)
Glucose-Capillary: 151 mg/dL — ABNORMAL HIGH (ref 70–99)

## 2020-11-15 LAB — LACTIC ACID, PLASMA
Lactic Acid, Venous: 2.6 mmol/L (ref 0.5–1.9)
Lactic Acid, Venous: 2 mmol/L (ref 0.5–1.9)

## 2020-11-15 LAB — LIPASE, BLOOD: Lipase: 31 U/L (ref 11–51)

## 2020-11-15 LAB — ACETAMINOPHEN LEVEL: Acetaminophen (Tylenol), Serum: <10 ug/mL — ABNORMAL LOW (ref 10–30)

## 2020-11-15 LAB — RESP PANEL BY RT-PCR (FLU A&B, COVID) ARPGX2
Influenza A by PCR: NEGATIVE
Influenza B by PCR: NEGATIVE
SARS Coronavirus 2 by RT PCR: NEGATIVE

## 2020-11-15 LAB — CBG MONITORING, ED: Glucose-Capillary: 146 mg/dL — ABNORMAL HIGH (ref 70–99)

## 2020-11-15 LAB — BETA-HYDROXYBUTYRIC ACID: Beta-Hydroxybutyric Acid: 6.61 mmol/L — ABNORMAL HIGH (ref 0.05–0.27)

## 2020-11-15 MED ORDER — HYDROMORPHONE HCL 1 MG/ML IJ SOLN
1.0000 mg | INTRAMUSCULAR | Status: DC | PRN
  Administered 2020-11-15 – 2020-11-17 (×10): 1 mg via INTRAVENOUS
  Filled 2020-11-15 (×10): qty 1

## 2020-11-15 MED ORDER — PANCRELIPASE (LIP-PROT-AMYL) 12000-38000 UNITS PO CPEP
12000.0000 [IU] | ORAL_CAPSULE | Freq: Three times a day (TID) | ORAL | Status: DC
  Administered 2020-11-15 – 2020-11-17 (×7): 12000 [IU] via ORAL
  Filled 2020-11-15 (×7): qty 1

## 2020-11-15 MED ORDER — LACTATED RINGERS IV BOLUS
1000.0000 mL | Freq: Once | INTRAVENOUS | Status: AC
  Administered 2020-11-15: 1000 mL via INTRAVENOUS

## 2020-11-15 MED ORDER — SUCRALFATE 1 G PO TABS
1.0000 g | ORAL_TABLET | Freq: Three times a day (TID) | ORAL | Status: DC
  Administered 2020-11-15 – 2020-11-17 (×9): 1 g via ORAL
  Filled 2020-11-15 (×9): qty 1

## 2020-11-15 MED ORDER — LACTATED RINGERS IV SOLN: INTRAVENOUS | Status: DC

## 2020-11-15 MED ORDER — INSULIN ASPART 100 UNIT/ML IJ SOLN
0.0000 [IU] | INTRAMUSCULAR | Status: DC
  Administered 2020-11-15: 2 [IU] via SUBCUTANEOUS
  Administered 2020-11-15 (×2): 1 [IU] via SUBCUTANEOUS
  Administered 2020-11-16: 3 [IU] via SUBCUTANEOUS
  Administered 2020-11-16 (×2): 2 [IU] via SUBCUTANEOUS
  Administered 2020-11-16: 1 [IU] via SUBCUTANEOUS
  Administered 2020-11-17: 2 [IU] via SUBCUTANEOUS
  Administered 2020-11-17 (×4): 1 [IU] via SUBCUTANEOUS
  Filled 2020-11-15: qty 0.09

## 2020-11-15 MED ORDER — PANTOPRAZOLE SODIUM 40 MG PO TBEC
40.0000 mg | DELAYED_RELEASE_TABLET | Freq: Every day | ORAL | Status: DC
  Administered 2020-11-15 – 2020-11-17 (×3): 40 mg via ORAL
  Filled 2020-11-15 (×3): qty 1

## 2020-11-15 MED ORDER — HYDRALAZINE HCL 20 MG/ML IJ SOLN: 10.0000 mg | Freq: Three times a day (TID) | INTRAMUSCULAR | Status: DC | PRN

## 2020-11-15 MED ORDER — ONDANSETRON HCL 4 MG/2ML IJ SOLN
4.0000 mg | Freq: Once | INTRAMUSCULAR | Status: AC
  Administered 2020-11-15: 4 mg via INTRAVENOUS
  Filled 2020-11-15: qty 2

## 2020-11-15 MED ORDER — ENOXAPARIN SODIUM 120 MG/0.8ML IJ SOSY
120.0000 mg | PREFILLED_SYRINGE | Freq: Two times a day (BID) | INTRAMUSCULAR | Status: DC
  Administered 2020-11-15: 120 mg via SUBCUTANEOUS
  Filled 2020-11-15 (×3): qty 0.8

## 2020-11-15 MED ORDER — BOOST / RESOURCE BREEZE PO LIQD CUSTOM
1.0000 | Freq: Three times a day (TID) | ORAL | Status: DC
Start: 2020-11-15 — End: 2020-11-17
  Administered 2020-11-15 – 2020-11-17 (×5): 1 via ORAL

## 2020-11-15 MED ORDER — PROCHLORPERAZINE EDISYLATE 10 MG/2ML IJ SOLN
10.0000 mg | Freq: Four times a day (QID) | INTRAMUSCULAR | Status: DC | PRN
  Administered 2020-11-17: 10 mg via INTRAVENOUS
  Filled 2020-11-15: qty 2

## 2020-11-15 MED ORDER — INSULIN ASPART 100 UNIT/ML IJ SOLN
0.0000 [IU] | Freq: Three times a day (TID) | INTRAMUSCULAR | Status: DC
  Filled 2020-11-15: qty 0.09

## 2020-11-15 MED ORDER — OXYCODONE HCL 5 MG PO TABS
5.0000 mg | ORAL_TABLET | Freq: Four times a day (QID) | ORAL | Status: DC | PRN
Start: 2020-11-15 — End: 2020-11-17
  Administered 2020-11-15 – 2020-11-17 (×6): 5 mg via ORAL
  Filled 2020-11-15 (×7): qty 1

## 2020-11-15 MED ORDER — HYDROMORPHONE HCL 1 MG/ML IJ SOLN
1.0000 mg | Freq: Once | INTRAMUSCULAR | Status: AC
  Administered 2020-11-15: 1 mg via INTRAVENOUS
  Filled 2020-11-15: qty 1
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
## 2020-11-15 NOTE — ED Triage Notes (Signed)
Pt presents with c/o abdominal pain and vomiting, worse over the last 6-7 days. Pt has a hx of pancreatitis.
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
## 2020-11-16 DIAGNOSIS — K861 Other chronic pancreatitis: Secondary | ICD-10-CM | POA: Diagnosis not present

## 2020-11-16 DIAGNOSIS — Z807 Family history of other malignant neoplasms of lymphoid, hematopoietic and related tissues: Secondary | ICD-10-CM | POA: Diagnosis not present

## 2020-11-16 DIAGNOSIS — K859 Acute pancreatitis without necrosis or infection, unspecified: Secondary | ICD-10-CM | POA: Diagnosis not present

## 2020-11-16 DIAGNOSIS — F17210 Nicotine dependence, cigarettes, uncomplicated: Secondary | ICD-10-CM | POA: Diagnosis present

## 2020-11-16 DIAGNOSIS — E872 Acidosis: Secondary | ICD-10-CM | POA: Diagnosis not present

## 2020-11-16 DIAGNOSIS — F1010 Alcohol abuse, uncomplicated: Secondary | ICD-10-CM | POA: Diagnosis present

## 2020-11-16 DIAGNOSIS — R109 Unspecified abdominal pain: Secondary | ICD-10-CM | POA: Diagnosis not present

## 2020-11-16 DIAGNOSIS — G8929 Other chronic pain: Secondary | ICD-10-CM | POA: Diagnosis present

## 2020-11-16 DIAGNOSIS — Z8719 Personal history of other diseases of the digestive system: Secondary | ICD-10-CM | POA: Diagnosis not present

## 2020-11-16 DIAGNOSIS — Z86711 Personal history of pulmonary embolism: Secondary | ICD-10-CM | POA: Diagnosis not present

## 2020-11-16 DIAGNOSIS — Z7901 Long term (current) use of anticoagulants: Secondary | ICD-10-CM | POA: Diagnosis not present

## 2020-11-16 DIAGNOSIS — Z86718 Personal history of other venous thrombosis and embolism: Secondary | ICD-10-CM | POA: Diagnosis not present

## 2020-11-16 DIAGNOSIS — Z66 Do not resuscitate: Secondary | ICD-10-CM | POA: Diagnosis present

## 2020-11-16 DIAGNOSIS — E119 Type 2 diabetes mellitus without complications: Secondary | ICD-10-CM | POA: Diagnosis present

## 2020-11-16 DIAGNOSIS — R7401 Elevation of levels of liver transaminase levels: Secondary | ICD-10-CM | POA: Diagnosis not present

## 2020-11-16 DIAGNOSIS — E785 Hyperlipidemia, unspecified: Secondary | ICD-10-CM | POA: Diagnosis present

## 2020-11-16 DIAGNOSIS — Z20822 Contact with and (suspected) exposure to covid-19: Secondary | ICD-10-CM | POA: Diagnosis present

## 2020-11-16 DIAGNOSIS — I10 Essential (primary) hypertension: Secondary | ICD-10-CM | POA: Diagnosis present

## 2020-11-16 DIAGNOSIS — R112 Nausea with vomiting, unspecified: Secondary | ICD-10-CM | POA: Diagnosis not present

## 2020-11-16 DIAGNOSIS — K760 Fatty (change of) liver, not elsewhere classified: Secondary | ICD-10-CM | POA: Diagnosis not present

## 2020-11-16 LAB — CBC
HCT: 36.6 % — ABNORMAL LOW (ref 39.0–52.0)
Hemoglobin: 12.4 g/dL — ABNORMAL LOW (ref 13.0–17.0)
MCH: 31.2 pg (ref 26.0–34.0)
MCHC: 33.9 g/dL (ref 30.0–36.0)
MCV: 92 fL (ref 80.0–100.0)
Platelets: 153 10*3/uL (ref 150–400)
RBC: 3.98 MIL/uL — ABNORMAL LOW (ref 4.22–5.81)
RDW: 12.9 % (ref 11.5–15.5)
WBC: 6.7 10*3/uL (ref 4.0–10.5)
nRBC: 0 % (ref 0.0–0.2)

## 2020-11-16 LAB — RENAL FUNCTION PANEL
Albumin: 3.4 g/dL — ABNORMAL LOW (ref 3.5–5.0)
Albumin: 3.2 g/dL — ABNORMAL LOW (ref 3.5–5.0)
Anion gap: 12 (ref 5–15)
Anion gap: 8 (ref 5–15)
BUN: 9 mg/dL (ref 6–20)
BUN: 8 mg/dL (ref 6–20)
CO2: 20 mmol/L — ABNORMAL LOW (ref 22–32)
CO2: 23 mmol/L (ref 22–32)
Calcium: 8.5 mg/dL — ABNORMAL LOW (ref 8.9–10.3)
Calcium: 8.5 mg/dL — ABNORMAL LOW (ref 8.9–10.3)
Chloride: 104 mmol/L (ref 98–111)
Chloride: 107 mmol/L (ref 98–111)
Creatinine, Ser: 0.85 mg/dL (ref 0.61–1.24)
Creatinine, Ser: 0.82 mg/dL (ref 0.61–1.24)
GFR, Estimated: >60 mL/min (ref >60)
GFR, Estimated: >60 mL/min (ref >60)
Glucose, Bld: 159 mg/dL — ABNORMAL HIGH (ref 70–99)
Glucose, Bld: 122 mg/dL — ABNORMAL HIGH (ref 70–99)
Phosphorus: 2.3 mg/dL — ABNORMAL LOW (ref 2.5–4.6)
Phosphorus: 2.8 mg/dL (ref 2.5–4.6)
Potassium: 3.1 mmol/L — ABNORMAL LOW (ref 3.5–5.1)
Potassium: 3.1 mmol/L — ABNORMAL LOW (ref 3.5–5.1)
Sodium: 136 mmol/L (ref 135–145)
Sodium: 138 mmol/L (ref 135–145)

## 2020-11-16 LAB — GLUCOSE, CAPILLARY
Glucose-Capillary: 126 mg/dL — ABNORMAL HIGH (ref 70–99)
Glucose-Capillary: 94 mg/dL (ref 70–99)
Glucose-Capillary: 165 mg/dL — ABNORMAL HIGH (ref 70–99)
Glucose-Capillary: 156 mg/dL — ABNORMAL HIGH (ref 70–99)
Glucose-Capillary: 88 mg/dL (ref 70–99)
Glucose-Capillary: 204 mg/dL — ABNORMAL HIGH (ref 70–99)

## 2020-11-16 LAB — MAGNESIUM: Magnesium: 1.6 mg/dL — ABNORMAL LOW (ref 1.7–2.4)

## 2020-11-16 MED ORDER — POTASSIUM CHLORIDE 10 MEQ/100ML IV SOLN
10.0000 meq | INTRAVENOUS | Status: AC
  Administered 2020-11-16 (×6): 10 meq via INTRAVENOUS
  Filled 2020-11-16 (×6): qty 100

## 2020-11-16 MED ORDER — DICYCLOMINE HCL 10 MG PO CAPS
10.0000 mg | ORAL_CAPSULE | Freq: Three times a day (TID) | ORAL | Status: DC
  Administered 2020-11-16 – 2020-11-17 (×4): 10 mg via ORAL
  Filled 2020-11-16 (×4): qty 1

## 2020-11-16 MED ORDER — MAGNESIUM SULFATE 2 GM/50ML IV SOLN
2.0000 g | Freq: Once | INTRAVENOUS | Status: AC
  Administered 2020-11-16: 2 g via INTRAVENOUS
  Filled 2020-11-16: qty 50

## 2020-11-16 MED ORDER — ENSURE MAX PROTEIN PO LIQD
11.0000 [oz_av] | Freq: Two times a day (BID) | ORAL | Status: DC
  Administered 2020-11-17: 11 [oz_av] via ORAL
  Filled 2020-11-16 (×3): qty 330

## 2020-11-16 MED ORDER — ADULT MULTIVITAMIN W/MINERALS CH
1.0000 | ORAL_TABLET | Freq: Every day | ORAL | Status: DC
  Administered 2020-11-16 – 2020-11-17 (×2): 1 via ORAL
  Filled 2020-11-16 (×2): qty 1

## 2020-11-16 MED ORDER — APIXABAN 5 MG PO TABS
5.0000 mg | ORAL_TABLET | Freq: Two times a day (BID) | ORAL | Status: DC
  Administered 2020-11-16 – 2020-11-17 (×2): 5 mg via ORAL
  Filled 2020-11-16 (×3): qty 1

## 2020-11-16 NOTE — Assessment & Plan Note (Addendum)
-   Due to poor oral intake and nausea, vomiting, diarrhea.  He has been unable to keep up with adequate oral nutrition over the past 3 days and requiring IV fluids now -Nausea and vomiting improved with fluids and supportive care - Tolerated soft diet prior to discharge

## 2020-11-16 NOTE — Assessment & Plan Note (Addendum)
-   hold Lipitor for now, can resume at discharge

## 2020-11-16 NOTE — Assessment & Plan Note (Signed)
-   last CT A/P on 8/30 shows necrotizing pancreatitis involving pancreatic body and tail.  Probable pseudocyst seen inferior and anterior to the pancreatic head which was smaller compared to prior exam. -Patient understands need for low residue diet and ongoing abstinence from alcohol

## 2020-11-16 NOTE — Assessment & Plan Note (Signed)
-   Possibly pain related - Continue trending and will treat as indicated

## 2020-11-16 NOTE — Assessment & Plan Note (Addendum)
-   Last A1c 9.4% on 09/18/2020

## 2020-11-16 NOTE — Hospital Course (Addendum)
Bee Hammerschmidt is a 60 y.o. male with PMH chronic abdominal pain, recurrent pancreatitis, DM2. Presenting w/ abdominal pain in the LUQ/RUQ. He has had several hospitalizations and ED visits w/ similar symptoms over the past several months. He reports that the pain never truly goes away, but it has worsened over the last 10 days. He has had N/V/D. He tried hydrocodone to help, but it only provided incomplete relief. He has not had any fevers. He reports that he has not been about to eat anything for the last 3 days. When his symptoms did not improve over the last 3 days. He decided to come to the ED.    He was found to have a metabolic acidosis. He was started on LR and antiemetics.  His acidosis improved with fluids and nausea was better controlled.  His diet was able to be advanced to a soft diet which he tolerated well.  He was amenable with discharging home and ongoing outpatient follow-up with GI.

## 2020-11-16 NOTE — Progress Notes (Signed)
Progress Note    Bryce Mendoza   XLK:[id number]  DOB: [birth year]  DOA: 11/15/2020     0  PCP: Claiborne Rigg, NP  Initial CC: N/V/D, abd pain  Hospital Course: Bryce Mendoza is a 60 y.o. male with PMH chronic abdominal pain, recurrent pancreatitis, DM2. Presenting w/ abdominal pain in the LUQ/RUQ. He has had several hospitalizations and ED visits w/ similar symptoms over the past several months. He reports that the pain never truly goes away, but it has worsened over the last 10 days. He has had N/V/D. He tried hydrocodone to help, but it only provided incomplete relief. He has not had any fevers. He reports that he has not been about to eat anything for the last 3 days. When his symptoms did not improve over the last 3 days. He decided to come to the ED.    He was found to have a metabolic acidosis. He was started on LR and antiemetics.   Interval History:  Resting comfortably this morning feeling a little better since admission.  States that his appetite is starting to improve some and pain also improving.  He is amenable for trial of advancing diet.  ROS: Constitutional: negative for chills and fevers, Respiratory: negative for cough and sputum, Cardiovascular: negative for chest pain, and Gastrointestinal: positive for abdominal pain  Assessment & Plan: * Starvation ketoacidosis - Due to poor oral intake and nausea, vomiting, diarrhea.  He has been unable to keep up with adequate oral nutrition over the past 3 days and requiring IV fluids now - Continue fluids - Advancing diet as tolerated  History of pulmonary embolism - On Eliquis at home - Started on Lovenox on admission  DM type 2 (diabetes mellitus, type 2) (HCC) - Last A1c 9.4% on 09/18/2020 - Continue SSI and CBG monitoring  Elevated BP without diagnosis of hypertension - Possibly pain related - Continue trending and will treat as indicated  HLD (hyperlipidemia) - hold Lipitor for now, can resume at  discharge  Chronic pancreatitis (HCC) - last CT A/P on 8/30 shows necrotizing pancreatitis involving pancreatic body and tail.  Probable pseudocyst seen inferior and anterior to the pancreatic head which was smaller compared to prior exam. -Patient understands need for low residue diet and ongoing abstinence from alcohol    Old records reviewed in assessment of this patient  Antimicrobials:   DVT prophylaxis:  apixaban (ELIQUIS) tablet 5 mg   Code Status:   Code Status: DNR Family Communication:   Disposition Plan: Status is: Inpatient  Remains inpatient appropriate because:IV treatments appropriate due to intensity of illness or inability to take PO and Inpatient level of care appropriate due to severity of illness  Dispo: The patient is from: Home              Anticipated d/c is to: Home              Patient currently is not medically stable to d/c.   Difficult to place patient No      Risk of unplanned readmission score: Unplanned Admission- Pilot do not use: 29.06   Objective: Blood pressure (!) 138/91, pulse 64, temperature (!) 97.2 F (36.2 C), resp. rate 18, height 6\' 1"  (1.854 m), weight 120.4 kg, SpO2 100 %.  Examination: General appearance: alert, cooperative, and no distress Head: Normocephalic, without obvious abnormality, atraumatic Eyes:  EOMI Lungs: clear to auscultation bilaterally Heart: regular rate and rhythm and S1, S2 normal Abdomen:  Tender to palpation in upper quadrants,  no rebound/guarding.  Bowel sounds present Extremities:  No edema Skin: mobility and turgor normal Neurologic: Grossly normal  Consultants:    Procedures:    Data Reviewed: I have personally reviewed following labs and imaging studies Results for orders placed or performed during the hospital encounter of 11/15/20 (from the past 24 hour(s))  Lactic acid, plasma     Status: Abnormal   Collection Time: 11/15/20  3:21 PM  Result Value Ref Range   Lactic Acid, Venous 2.6  (HH) 0.5 - 1.9 mmol/L  Glucose, capillary     Status: Abnormal   Collection Time: 11/15/20  3:58 PM  Result Value Ref Range   Glucose-Capillary 140 (H) 70 - 99 mg/dL  Renal function panel     Status: Abnormal   Collection Time: 11/15/20  5:58 PM  Result Value Ref Range   Sodium 140 135 - 145 mmol/L   Potassium 3.2 (L) 3.5 - 5.1 mmol/L   Chloride 104 98 - 111 mmol/L   CO2 21 (L) 22 - 32 mmol/L   Glucose, Bld 112 (H) 70 - 99 mg/dL   BUN 10 6 - 20 mg/dL   Creatinine, Ser 2.70 0.61 - 1.24 mg/dL   Calcium 8.9 8.9 - 35.0 mg/dL   Phosphorus 2.4 (L) 2.5 - 4.6 mg/dL   Albumin 3.6 3.5 - 5.0 g/dL   GFR, Estimated >09 >38 mL/min   Anion gap 15 5 - 15  Lactic acid, plasma     Status: Abnormal   Collection Time: 11/15/20  5:58 PM  Result Value Ref Range   Lactic Acid, Venous 2.0 (HH) 0.5 - 1.9 mmol/L  Glucose, capillary     Status: Abnormal   Collection Time: 11/15/20  9:07 PM  Result Value Ref Range   Glucose-Capillary 151 (H) 70 - 99 mg/dL  Renal function panel     Status: Abnormal   Collection Time: 11/16/20 12:33 AM  Result Value Ref Range   Sodium 136 135 - 145 mmol/L   Potassium 3.1 (L) 3.5 - 5.1 mmol/L   Chloride 104 98 - 111 mmol/L   CO2 20 (L) 22 - 32 mmol/L   Glucose, Bld 159 (H) 70 - 99 mg/dL   BUN 9 6 - 20 mg/dL   Creatinine, Ser 1.82 0.61 - 1.24 mg/dL   Calcium 8.5 (L) 8.9 - 10.3 mg/dL   Phosphorus 2.3 (L) 2.5 - 4.6 mg/dL   Albumin 3.4 (L) 3.5 - 5.0 g/dL   GFR, Estimated >99 >37 mL/min   Anion gap 12 5 - 15  Glucose, capillary     Status: Abnormal   Collection Time: 11/16/20  4:15 AM  Result Value Ref Range   Glucose-Capillary 126 (H) 70 - 99 mg/dL  CBC     Status: Abnormal   Collection Time: 11/16/20  5:52 AM  Result Value Ref Range   WBC 6.7 4.0 - 10.5 K/uL   RBC 3.98 (L) 4.22 - 5.81 MIL/uL   Hemoglobin 12.4 (L) 13.0 - 17.0 g/dL   HCT 16.9 (L) 67.8 - 93.8 %   MCV 92.0 80.0 - 100.0 fL   MCH 31.2 26.0 - 34.0 pg   MCHC 33.9 30.0 - 36.0 g/dL   RDW 10.1 75.1 -  02.5 %   Platelets 153 150 - 400 K/uL   nRBC 0.0 0.0 - 0.2 %  Renal function panel     Status: Abnormal   Collection Time: 11/16/20  5:52 AM  Result Value Ref Range   Sodium 138 135 -  145 mmol/L   Potassium 3.1 (L) 3.5 - 5.1 mmol/L   Chloride 107 98 - 111 mmol/L   CO2 23 22 - 32 mmol/L   Glucose, Bld 122 (H) 70 - 99 mg/dL   BUN 8 6 - 20 mg/dL   Creatinine, Ser 3.15 0.61 - 1.24 mg/dL   Calcium 8.5 (L) 8.9 - 10.3 mg/dL   Phosphorus 2.8 2.5 - 4.6 mg/dL   Albumin 3.2 (L) 3.5 - 5.0 g/dL   GFR, Estimated >17 >61 mL/min   Anion gap 8 5 - 15  Magnesium     Status: Abnormal   Collection Time: 11/16/20  5:52 AM  Result Value Ref Range   Magnesium 1.6 (L) 1.7 - 2.4 mg/dL  Glucose, capillary     Status: None   Collection Time: 11/16/20  7:29 AM  Result Value Ref Range   Glucose-Capillary 94 70 - 99 mg/dL  Glucose, capillary     Status: Abnormal   Collection Time: 11/16/20 11:38 AM  Result Value Ref Range   Glucose-Capillary 165 (H) 70 - 99 mg/dL    Recent Results (from the past 240 hour(s))  Resp Panel by RT-PCR (Flu A&B, Covid) Nasopharyngeal Swab     Status: None   Collection Time: 11/15/20 11:20 AM   Specimen: Nasopharyngeal Swab; Nasopharyngeal(NP) swabs in vial transport medium  Result Value Ref Range Status   SARS Coronavirus 2 by RT PCR NEGATIVE NEGATIVE Final    Comment: (NOTE) SARS-CoV-2 target nucleic acids are NOT DETECTED.  The SARS-CoV-2 RNA is generally detectable in upper respiratory specimens during the acute phase of infection. The lowest concentration of SARS-CoV-2 viral copies this assay can detect is 138 copies/mL. A negative result does not preclude SARS-Cov-2 infection and should not be used as the sole basis for treatment or other patient management decisions. A negative result may occur with  improper specimen collection/handling, submission of specimen other than nasopharyngeal swab, presence of viral mutation(s) within the areas targeted by this assay,  and inadequate number of viral copies(<138 copies/mL). A negative result must be combined with clinical observations, patient history, and epidemiological information. The expected result is Negative.  Fact Sheet for Patients:  BloggerCourse.com  Fact Sheet for Healthcare Providers:  SeriousBroker.it  This test is no t yet approved or cleared by the [place] FDA and  has been authorized for detection and/or diagnosis of SARS-CoV-2 by FDA under an Emergency Use Authorization (EUA). This EUA will remain  in effect (meaning this test can be used) for the duration of the COVID-19 declaration under Section 564(b)(1) of the Act, 21 U.S.C.section 360bbb-3(b)(1), unless the authorization is terminated  or revoked sooner.       Influenza A by PCR NEGATIVE NEGATIVE Final   Influenza B by PCR NEGATIVE NEGATIVE Final    Comment: (NOTE) The Xpert Xpress SARS-CoV-2/FLU/RSV plus assay is intended as an aid in the diagnosis of influenza from Nasopharyngeal swab specimens and should not be used as a sole basis for treatment. Nasal washings and aspirates are unacceptable for Xpert Xpress SARS-CoV-2/FLU/RSV testing.  Fact Sheet for Patients: BloggerCourse.com  Fact Sheet for Healthcare Providers: SeriousBroker.it  This test is not yet approved or cleared by the [place] FDA and has been authorized for detection and/or diagnosis of SARS-CoV-2 by FDA under an Emergency Use Authorization (EUA). This EUA will remain in effect (meaning this test can be used) for the duration of the COVID-19 declaration under Section 564(b)(1) of the Act, 21 U.S.C. section 360bbb-3(b)(1), unless the  authorization is terminated or revoked.  Performed at [facility], [address] [place], [place] [address]      Radiology Studies: US Abdomen Complete  Result Date:  11/15/2020 CLINICAL DATA:  Abdominal pain.  History of pancreatitis EXAM: ABDOMEN ULTRASOUND COMPLETE COMPARISON:  CT 10/30/2020 FINDINGS: Gallbladder: No gallstones or wall thickening visualized. No sonographic Murphy sign noted by sonographer. Common bile duct: Diameter: Normal caliber, 4 mm Liver: Increased echotexture compatible with fatty infiltration. No focal abnormality or biliary ductal dilatation. Portal vein is patent on color Doppler imaging with normal direction of blood flow towards the liver. IVC: No abnormality visualized. Pancreas: Not visualized due to overlying bowel gas. Spleen: Size and appearance within normal limits. Right Kidney: Length: 11.0 cm. Echogenicity within normal limits. No mass or hydronephrosis visualized. Left Kidney: Length: 11.4 cm. Echogenicity within normal limits. No mass or hydronephrosis visualized. Abdominal aorta: No aneurysm visualized. Other findings: None. IMPRESSION: No cholelithiasis or cholecystitis. Hepatic steatosis. Pancreas not visualized due to overlying bowel gas. Electronically Signed   By: Charlett Nose M.D.   On: 11/15/2020 10:32   US Abdomen Complete  Final Result      Scheduled Meds:  apixaban  5 mg Oral BID   feeding supplement  1 Container Oral TID BM   insulin aspart  0-9 Units Subcutaneous Q4H   lipase/protease/amylase  12,000 Units Oral TID AC   pantoprazole  40 mg Oral Daily   sucralfate  1 g Oral TID WC & HS   PRN Meds: hydrALAZINE, HYDROmorphone (DILAUDID) injection, oxyCODONE, prochlorperazine Continuous Infusions:  lactated ringers 125 mL/hr at 11/16/20 4259   magnesium sulfate bolus IVPB     potassium chloride 10 mEq (11/16/20 1218)     LOS: 0 days  Time spent: Greater than 50% of the 35 minute visit was spent in counseling/coordination of care for the patient as laid out in the A&P.   Lewie Chamber, MD [facility] 11/16/2020, 2:49 PM

## 2020-11-16 NOTE — Assessment & Plan Note (Addendum)
-   On Eliquis at home

## 2020-11-16 NOTE — Progress Notes (Signed)
Initial Nutrition Assessment  DOCUMENTATION CODES:   Obesity unspecified  INTERVENTION:   -Ensure MAX Protein po BID, each supplement provides 150 kcal and 30 grams of protein   -Multivitamin with minerals daily  -If pt unable to maintain adequate PO intakes, need to consider placement of feeding tube.  NUTRITION DIAGNOSIS:   Increased nutrient needs related to chronic illness as evidenced by estimated needs.  GOAL:   Patient will meet greater than or equal to 90% of their needs  MONITOR:   PO intake, Supplement acceptance, Labs, Weight trends, I & O's  REASON FOR ASSESSMENT:   Malnutrition Screening Tool    ASSESSMENT:   60 y.o. male with PMH chronic abdominal pain, recurrent pancreatitis, DM2. Presenting w/ abdominal pain in the LUQ/RUQ. He has had several hospitalizations and ED visits w/ similar symptoms over the past several months. He reports that the pain never truly goes away, but it has worsened over the last 10 days. He has had N/V/D.  Patient recently discharged for pancreatitis 9/6. Since that discharge, pt has had progressively worsening N/V/D. No PO intakes for that past 3 days PTA.  Will order Ensure Max now that pt is on full liquids. If pt unable to maintain adequate PO intakes, need to consider placement of feeding tube. Patient at high risk of malnutrition.   Per weight records, pt has lost 17 lbs since 8/30 (6% wt loss x 2 weeks, significant for time frame).  Medications: Creon, Carafate, Lactated ringers, IV Mg sulfate, KCl  Labs reviewed:  CBGs: 88-165 Low K, Mg  NUTRITION - FOCUSED PHYSICAL EXAM:  No depletions noted.  Diet Order:   Diet Order             DIET SOFT Room service appropriate? Yes; Fluid consistency: Thin  Diet effective now                   EDUCATION NEEDS:   No education needs have been identified at this time  Skin:  Skin Assessment: Reviewed RN Assessment  Last BM:  9/15 -type 7  Height:   Ht Readings  from Last 1 Encounters:  11/15/20 6\' 1"  (1.854 m)    Weight:   Wt Readings from Last 1 Encounters:  11/15/20 120.4 kg    BMI:  Body mass index is 35.02 kg/m.  Estimated Nutritional Needs:   Kcal:  2300-2500  Protein:  115-125g  Fluid:  2.3L/day  11/17/20, MS, RD, LDN Inpatient Clinical Dietitian Contact information available via Amion

## 2020-11-17 DIAGNOSIS — K861 Other chronic pancreatitis: Secondary | ICD-10-CM

## 2020-11-17 DIAGNOSIS — E872 Acidosis: Secondary | ICD-10-CM | POA: Diagnosis not present

## 2020-11-17 LAB — COMPREHENSIVE METABOLIC PANEL
ALT: 33 U/L (ref 0–44)
AST: 49 U/L — ABNORMAL HIGH (ref 15–41)
Albumin: 3.1 g/dL — ABNORMAL LOW (ref 3.5–5.0)
Alkaline Phosphatase: 61 U/L (ref 38–126)
Anion gap: 9 (ref 5–15)
BUN: <5 mg/dL — ABNORMAL LOW (ref 6–20)
CO2: 24 mmol/L (ref 22–32)
Calcium: 8.9 mg/dL (ref 8.9–10.3)
Chloride: 109 mmol/L (ref 98–111)
Creatinine, Ser: 0.66 mg/dL (ref 0.61–1.24)
GFR, Estimated: >60 mL/min (ref >60)
Glucose, Bld: 140 mg/dL — ABNORMAL HIGH (ref 70–99)
Potassium: 3.3 mmol/L — ABNORMAL LOW (ref 3.5–5.1)
Sodium: 142 mmol/L (ref 135–145)
Total Bilirubin: 1 mg/dL (ref 0.3–1.2)
Total Protein: 5.7 g/dL — ABNORMAL LOW (ref 6.5–8.1)

## 2020-11-17 LAB — GLUCOSE, CAPILLARY
Glucose-Capillary: 149 mg/dL — ABNORMAL HIGH (ref 70–99)
Glucose-Capillary: 174 mg/dL — ABNORMAL HIGH (ref 70–99)
Glucose-Capillary: 127 mg/dL — ABNORMAL HIGH (ref 70–99)
Glucose-Capillary: 129 mg/dL — ABNORMAL HIGH (ref 70–99)
Glucose-Capillary: 145 mg/dL — ABNORMAL HIGH (ref 70–99)

## 2020-11-17 LAB — MAGNESIUM: Magnesium: 1.8 mg/dL (ref 1.7–2.4)

## 2020-11-17 MED ORDER — POTASSIUM CHLORIDE CRYS ER 20 MEQ PO TBCR
40.0000 meq | EXTENDED_RELEASE_TABLET | Freq: Once | ORAL | Status: AC
  Administered 2020-11-17: 40 meq via ORAL
  Filled 2020-11-17: qty 2

## 2020-11-17 MED ORDER — OXYCODONE HCL 10 MG PO TABS: 10.0000 mg | ORAL_TABLET | ORAL | 0 refills | Status: DC | PRN

## 2020-11-17 MED ORDER — ONDANSETRON HCL 4 MG PO TABS: 4.0000 mg | ORAL_TABLET | Freq: Three times a day (TID) | ORAL | 0 refills | Status: DC | PRN

## 2020-11-17 NOTE — Progress Notes (Signed)
Patient discharged. Paperwork reviewed. IV removed.

## 2020-11-17 NOTE — Discharge Summary (Signed)
Physician Discharge Summary   Bryce Mendoza YTK:[id number] DOB: 1960-08-31 DOA: 11/15/2020  PCP: [name], NP  Admit date: 11/15/2020 Discharge date: 11/17/2020  Admitted From: home Disposition:  home Discharging physician: Dwyane Dee, MD  Recommendations for Outpatient Follow-up:  Follow up with GI  Home Health:  Equipment/Devices:   Patient discharged to home in Discharge Condition: stable Risk of unplanned readmission score: Unplanned Admission- Pilot do not use: 27.07  CODE STATUS: Full Diet recommendation:  Diet Orders (From admission, onward)     Start     Ordered   11/16/20 1354  DIET SOFT Room service appropriate? Yes; Fluid consistency: Thin  Diet effective now       Question Answer Comment  Room service appropriate? Yes   Fluid consistency: Thin      11/16/20 1353            Hospital Course: Bryce Mendoza is a 60 y.o. male with PMH chronic abdominal pain, recurrent pancreatitis, DM2. Presenting w/ abdominal pain in the LUQ/RUQ. He has had several hospitalizations and ED visits w/ similar symptoms over the past several months. He reports that the pain never truly goes away, but it has worsened over the last 10 days. He has had N/V/D. He tried hydrocodone to help, but it only provided incomplete relief. He has not had any fevers. He reports that he has not been about to eat anything for the last 3 days. When his symptoms did not improve over the last 3 days. He decided to come to the ED.    He was found to have a metabolic acidosis. He was started on LR and antiemetics.  His acidosis improved with fluids and nausea was better controlled.  His diet was able to be advanced to a soft diet which he tolerated well.  He was amenable with discharging home and ongoing outpatient follow-up with GI.  * Starvation ketoacidosis-resolved as of 11/17/2020 - Due to poor oral intake and nausea, vomiting, diarrhea.  He has been unable to keep up with adequate oral nutrition  over the past 3 days and requiring IV fluids now -Nausea and vomiting improved with fluids and supportive care - Tolerated soft diet prior to discharge  History of pulmonary embolism - On Eliquis at home  DM type 2 (diabetes mellitus, type 2) (HCC) - Last A1c 9.4% on 09/18/2020  Elevated BP without diagnosis of hypertension - Possibly pain related - Continue trending and will treat as indicated  HLD (hyperlipidemia) - hold Lipitor for now, can resume at discharge  Chronic pancreatitis ([place]) - last CT A/P on 8/30 shows necrotizing pancreatitis involving pancreatic body and tail.  Probable pseudocyst seen inferior and anterior to the pancreatic head which was smaller compared to prior exam. -Patient understands need for low residue diet and ongoing abstinence from alcohol    The patient's chronic medical conditions were treated accordingly per the patient's home medication regimen except as noted.  On day of discharge, patient was felt deemed stable for discharge. Patient/family member advised to call PCP or come back to ER if needed.   Principal Diagnosis: Starvation ketoacidosis  Discharge Diagnoses: Active Hospital Problems   Diagnosis Date Noted   DM type 2 (diabetes mellitus, type 2) ([place]) 10/30/2020   History of pulmonary embolism 10/30/2020   HLD (hyperlipidemia) 09/18/2020   Elevated BP without diagnosis of hypertension 09/18/2020   Chronic pancreatitis ([place]) 09/17/2020    Resolved Hospital Problems   Diagnosis Date Noted Date Resolved   Starvation ketoacidosis 10/18/2020  11/17/2020    Priority: High    Discharge Instructions     Increase activity slowly   Complete by: As directed       Allergies as of 11/17/2020   No Known Allergies      Medication List     STOP taking these medications    LORazepam 1 MG tablet Commonly known as: ATIVAN   methocarbamol 500 MG tablet Commonly known as: ROBAXIN       TAKE these medications    Accu-Chek Guide  test strip Generic drug: glucose blood Check blood sugar once daily.   atorvastatin 40 MG tablet Commonly known as: LIPITOR Take 1 tablet (40 mg total) by mouth daily.   blood glucose meter kit and supplies Kit Dispense based on patient and insurance preference. Use up to four times daily as directed.   Creon 12000-38000 units Cpep capsule Generic drug: lipase/protease/amylase Take 12,000 Units by mouth 3 (three) times daily before meals.   dicyclomine 10 MG capsule Commonly known as: BENTYL Take 1 capsule (10 mg total) by mouth 3 (three) times daily before meals.   diphenhydrAMINE 25 mg capsule Commonly known as: BENADRYL Take 25 mg by mouth at bedtime as needed for sleep.   Eliquis 5 MG Tabs tablet Generic drug: apixaban Take 1 tablet (5 mg total) by mouth 2 (two) times daily.   FreeStyle Libre 14 Day Reader Kerrin Mo Use as instructed. Check blood glucose level by fingerstick 3times per day. E11.65 G61.81 G60.9   FreeStyle Libre 14 Day Sensor Misc Use as instructed. Check blood glucose level by fingerstick 3times per day. E11.65 G61.81 G60.9   glipiZIDE 5 MG tablet Commonly known as: GLUCOTROL Take 1 tablet (5 mg total) by mouth daily before breakfast.   hydrOXYzine 25 MG capsule Commonly known as: VISTARIL Take 1 capsule (25 mg total) by mouth every 8 (eight) hours as needed. What changed: reasons to take this   metFORMIN 500 MG 24 hr tablet Commonly known as: Glucophage XR Take 1 tablet (500 mg total) by mouth 2 (two) times daily.   multivitamin tablet Take 1 tablet by mouth daily.   nortriptyline 10 MG capsule Commonly known as: PAMELOR TAKE 1 CAPSULE BY MOUTH DAILY and  TAKE 2 capsules by mouth AT BEDTIME FOR NEUROPATHY What changed:  how much to take how to take this when to take this additional instructions   ondansetron 4 MG tablet Commonly known as: Zofran Take 1 tablet (4 mg total) by mouth every 8 (eight) hours as needed for nausea or vomiting.    Oxycodone HCl 10 MG Tabs Take 1 tablet (10 mg total) by mouth every 4 (four) hours as needed for breakthrough pain or moderate pain.   pantoprazole 40 MG tablet Commonly known as: PROTONIX Take 1 tablet (40 mg total) by mouth daily.   sucralfate 1 g tablet Commonly known as: CARAFATE Take 1 tablet (1 g total) by mouth 4 (four) times daily -  with meals and at bedtime.   traZODone 50 MG tablet Commonly known as: DESYREL Take 1-2 tablets (50-100 mg total) by mouth at bedtime as needed for sleep. What changed: how much to take        No Known Allergies  Consultations:   Discharge Exam: BP (!) 160/94 (BP Location: Left Arm)   Pulse 87   Temp 98.1 F (36.7 C) (Oral)   Resp 19   Ht _0  (1.854 m)   Wt 120.4 kg   SpO2 97%   BMI  35.02 kg/m  General appearance: alert, cooperative, and no distress Head: Normocephalic, without obvious abnormality, atraumatic Eyes:  EOMI Lungs: clear to auscultation bilaterally Heart: regular rate and rhythm and S1, S2 normal Abdomen:  Tender to palpation in upper quadrants, no rebound/guarding.  Bowel sounds present Extremities:  No edema Skin: mobility and turgor normal Neurologic: Grossly normal   The results of significant diagnostics from this hospitalization (including imaging, microbiology, ancillary and laboratory) are listed below for reference.   Microbiology: Recent Results (from the past 240 hour(s))  Resp Panel by RT-PCR (Flu A&B, Covid) Nasopharyngeal Swab     Status: None   Collection Time: 11/15/20 11:20 AM   Specimen: Nasopharyngeal Swab; Nasopharyngeal(NP) swabs in vial transport medium  Result Value Ref Range Status   SARS Coronavirus 2 by RT PCR NEGATIVE NEGATIVE Final    Comment: (NOTE) SARS-CoV-2 target nucleic acids are NOT DETECTED.  The SARS-CoV-2 RNA is generally detectable in upper respiratory specimens during the acute phase of infection. The lowest concentration of SARS-CoV-2 viral copies this assay can  detect is 138 copies/mL. A negative result does not preclude SARS-Cov-2 infection and should not be used as the sole basis for treatment or other patient management decisions. A negative result may occur with  improper specimen collection/handling, submission of specimen other than nasopharyngeal swab, presence of viral mutation(s) within the areas targeted by this assay, and inadequate number of viral copies(<138 copies/mL). A negative result must be combined with clinical observations, patient history, and epidemiological information. The expected result is Negative.  Fact Sheet for Patients:  EntrepreneurPulse.com.au  Fact Sheet for Healthcare Providers:  IncredibleEmployment.be  This test is no t yet approved or cleared by the [place] FDA and  has been authorized for detection and/or diagnosis of SARS-CoV-2 by FDA under an Emergency Use Authorization (EUA). This EUA will remain  in effect (meaning this test can be used) for the duration of the COVID-19 declaration under Section 564(b)(1) of the Act, 21 U.S.C.section 360bbb-3(b)(1), unless the authorization is terminated  or revoked sooner.       Influenza A by PCR NEGATIVE NEGATIVE Final   Influenza B by PCR NEGATIVE NEGATIVE Final    Comment: (NOTE) The Xpert Xpress SARS-CoV-2/FLU/RSV plus assay is intended as an aid in the diagnosis of influenza from Nasopharyngeal swab specimens and should not be used as a sole basis for treatment. Nasal washings and aspirates are unacceptable for Xpert Xpress SARS-CoV-2/FLU/RSV testing.  Fact Sheet for Patients: EntrepreneurPulse.com.au  Fact Sheet for Healthcare Providers: IncredibleEmployment.be  This test is not yet approved or cleared by the [place] FDA and has been authorized for detection and/or diagnosis of SARS-CoV-2 by FDA under an Emergency Use Authorization (EUA). This EUA will remain in  effect (meaning this test can be used) for the duration of the COVID-19 declaration under Section 564(b)(1) of the Act, 21 U.S.C. section 360bbb-3(b)(1), unless the authorization is terminated or revoked.  Performed at [facility], [place] [address], [place], [place] [address]      Labs: BNP (last 3 results) No results for input(s): BNP in the last 8760 hours. Basic Metabolic Panel: Recent Labs  Lab 11/15/20 1300 11/15/20 1758 11/16/20 0033 11/16/20 0552 11/17/20 0514  NA 139 140 136 138 142  K 3.6 3.2* 3.1* 3.1* 3.3*  CL 109 104 104 107 109  CO2 15* 21* 20* 23 24  GLUCOSE 155* 112* 159* 122* 140*  BUN _0 <5*  CREATININE 0.80 0.88 0.85 0.82  0.66  CALCIUM 9.1 8.9 8.5* 8.5* 8.9  MG  --   --   --  1.6* 1.8  PHOS 3.2 2.4* 2.3* 2.8  --    Liver Function Tests: Recent Labs  Lab 11/15/20 0735 11/15/20 1300 11/15/20 1758 11/16/20 0033 11/16/20 0552 11/17/20 0514  AST 135*  --   --   --   --  49*  ALT 61*  --   --   --   --  33  ALKPHOS 79  --   --   --   --  61  BILITOT 1.3*  --   --   --   --  1.0  PROT 8.2*  --   --   --   --  5.7*  ALBUMIN 4.4 3.7 3.6 3.4* 3.2* 3.1*   Recent Labs  Lab 11/15/20 0735  LIPASE 31   No results for input(s): AMMONIA in the last 168 hours. CBC: Recent Labs  Lab 11/15/20 0735 11/16/20 0552  WBC 10.7* 6.7  NEUTROABS 6.3  --   HGB 15.1 12.4*  HCT 44.4 36.6*  MCV 93.1 92.0  PLT 263 153   Cardiac Enzymes: No results for input(s): CKTOTAL, CKMB, CKMBINDEX, TROPONINI in the last 168 hours. BNP: Invalid input(s): POCBNP CBG: Recent Labs  Lab 11/16/20 0729 11/16/20 1138 11/16/20 1612 11/16/20 2009 11/17/20 1202  GLUCAP 94 165* 88 204* 149*   D-Dimer No results for input(s): DDIMER in the last 72 hours. Hgb A1c No results for input(s): HGBA1C in the last 72 hours. Lipid Profile No results for input(s): CHOL, HDL, LDLCALC, TRIG, CHOLHDL, LDLDIRECT in the last 72 hours. Thyroid function studies No  results for input(s): TSH, T4TOTAL, T3FREE, THYROIDAB in the last 72 hours.  Invalid input(s): FREET3 Anemia work up No results for input(s): VITAMINB12, FOLATE, FERRITIN, TIBC, IRON, RETICCTPCT in the last 72 hours. Urinalysis    Component Value Date/Time   COLORURINE YELLOW 11/15/2020 0927   APPEARANCEUR HAZY (A) 11/15/2020 0927   LABSPEC 1.016 11/15/2020 0927   PHURINE 5.0 11/15/2020 0927   GLUCOSEU NEGATIVE 11/15/2020 0927   HGBUR NEGATIVE 11/15/2020 0927   BILIRUBINUR NEGATIVE 11/15/2020 0927   KETONESUR 80 (A) 11/15/2020 0927   PROTEINUR 30 (A) 11/15/2020 0927   NITRITE NEGATIVE 11/15/2020 0927   LEUKOCYTESUR NEGATIVE 11/15/2020 0927   Sepsis Labs Invalid input(s): PROCALCITONIN,  WBC,  LACTICIDVEN Microbiology Recent Results (from the past 240 hour(s))  Resp Panel by RT-PCR (Flu A&B, Covid) Nasopharyngeal Swab     Status: None   Collection Time: 11/15/20 11:20 AM   Specimen: Nasopharyngeal Swab; Nasopharyngeal(NP) swabs in vial transport medium  Result Value Ref Range Status   SARS Coronavirus 2 by RT PCR NEGATIVE NEGATIVE Final    Comment: (NOTE) SARS-CoV-2 target nucleic acids are NOT DETECTED.  The SARS-CoV-2 RNA is generally detectable in upper respiratory specimens during the acute phase of infection. The lowest concentration of SARS-CoV-2 viral copies this assay can detect is 138 copies/mL. A negative result does not preclude SARS-Cov-2 infection and should not be used as the sole basis for treatment or other patient management decisions. A negative result may occur with  improper specimen collection/handling, submission of specimen other than nasopharyngeal swab, presence of viral mutation(s) within the areas targeted by this assay, and inadequate number of viral copies(<138 copies/mL). A negative result must be combined with clinical observations, patient history, and epidemiological information. The expected result is Negative.  Fact Sheet for Patients:   EntrepreneurPulse.com.au  Fact Sheet for  Healthcare Providers:  IncredibleEmployment.be  This test is no t yet approved or cleared by the [place] and  has been authorized for detection and/or diagnosis of SARS-CoV-2 by FDA under an Emergency Use Authorization (EUA). This EUA will remain  in effect (meaning this test can be used) for the duration of the COVID-19 declaration under Section 564(b)(1) of the Act, 21 U.S.C.section 360bbb-3(b)(1), unless the authorization is terminated  or revoked sooner.       Influenza A by PCR NEGATIVE NEGATIVE Final   Influenza B by PCR NEGATIVE NEGATIVE Final    Comment: (NOTE) The Xpert Xpress SARS-CoV-2/FLU/RSV plus assay is intended as an aid in the diagnosis of influenza from Nasopharyngeal swab specimens and should not be used as a sole basis for treatment. Nasal washings and aspirates are unacceptable for Xpert Xpress SARS-CoV-2/FLU/RSV testing.  Fact Sheet for Patients: EntrepreneurPulse.com.au  Fact Sheet for Healthcare Providers: IncredibleEmployment.be  This test is not yet approved or cleared by the [place] FDA and has been authorized for detection and/or diagnosis of SARS-CoV-2 by FDA under an Emergency Use Authorization (EUA). This EUA will remain in effect (meaning this test can be used) for the duration of the COVID-19 declaration under Section 564(b)(1) of the Act, 21 U.S.C. section 360bbb-3(b)(1), unless the authorization is terminated or revoked.  Performed at [facility], [place] [address], [place], [place] [address]     Procedures/Studies: US Abdomen Complete  Result Date: 11/15/2020 CLINICAL DATA:  Abdominal pain.  History of pancreatitis EXAM: ABDOMEN ULTRASOUND COMPLETE COMPARISON:  CT 10/30/2020 FINDINGS: Gallbladder: No gallstones or wall thickening visualized. No sonographic Murphy sign noted by sonographer.  Common bile duct: Diameter: Normal caliber, 4 mm Liver: Increased echotexture compatible with fatty infiltration. No focal abnormality or biliary ductal dilatation. Portal vein is patent on color Doppler imaging with normal direction of blood flow towards the liver. IVC: No abnormality visualized. Pancreas: Not visualized due to overlying bowel gas. Spleen: Size and appearance within normal limits. Right Kidney: Length: 11.0 cm. Echogenicity within normal limits. No mass or hydronephrosis visualized. Left Kidney: Length: 11.4 cm. Echogenicity within normal limits. No mass or hydronephrosis visualized. Abdominal aorta: No aneurysm visualized. Other findings: None. IMPRESSION: No cholelithiasis or cholecystitis. Hepatic steatosis. Pancreas not visualized due to overlying bowel gas. Electronically Signed   By: Rolm Baptise M.D.   On: 11/15/2020 10:32   CT ABDOMEN PELVIS W CONTRAST  Result Date: 10/30/2020 CLINICAL DATA:  Acute generalized abdominal pain, emesis. EXAM: CT ABDOMEN AND PELVIS WITH CONTRAST TECHNIQUE: Multidetector CT imaging of the abdomen and pelvis was performed using the standard protocol following bolus administration of intravenous contrast. CONTRAST:  16m OMNIPAQUE IOHEXOL 350 MG/ML SOLN COMPARISON:  October 17, 2020. FINDINGS: Lower chest: Minimal bibasilar subsegmental atelectasis is noted. Hepatobiliary: No gallstones or biliary dilatation is noted. Hepatic steatosis. Pancreas: Findings consistent with necrotizing pancreatitis involving pancreatic body and tail. Associated fluid collection measures approximately 11.8 x 3.5 cm which is slightly decreased compared to prior exam. 3.5 x 2.2 cm fluid collection is seen inferior and anterior to pancreatic head consistent with pseudocyst which is slightly decreased compared to prior exam. Spleen: Normal in size without focal abnormality. Adrenals/Urinary Tract: Adrenal glands are unremarkable. Kidneys are normal, without renal calculi, focal lesion,  or hydronephrosis. Bladder is unremarkable. Stomach/Bowel: The stomach appears normal. There is no evidence of bowel obstruction or inflammation. Vascular/Lymphatic: Aortic atherosclerosis. No enlarged abdominal or pelvic lymph nodes. Reproductive: Prostate is unremarkable. Other: No abdominal wall hernia  or abnormality. No abdominopelvic ascites. Musculoskeletal: No acute or significant osseous findings. IMPRESSION: Findings consistent with necrotizing pancreatitis involving the pancreatic body and tail. Associated fluid collection is slightly decreased compared to prior exam. Probable pseudocyst is seen inferior and anterior to the pancreatic head which is also slightly decreased compared to prior exam. Hepatic steatosis. Aortic Atherosclerosis (ICD10-I70.0). Electronically Signed   By: Marijo Conception M.D.   On: 10/30/2020 16:57     Time coordinating discharge: Over 30 minutes    Dwyane Dee, MD  [facility] 11/17/2020, 12:23 PM

## 2020-11-19 ENCOUNTER — Telehealth: Payer: Self-pay

## 2020-11-19 NOTE — Telephone Encounter (Signed)
Transition Care Management Unsuccessful Follow-up Telephone Call  Date of discharge and from where: Bryce Mendoza on 11/17/2020  Attempts:  1st Attempt  Reason for unsuccessful TCM follow-up call:  Left voice message unable to reach pt at his time.

## 2020-11-20 ENCOUNTER — Ambulatory Visit: Payer: Medicaid Other | Admitting: Neurology

## 2020-11-20 ENCOUNTER — Telehealth: Payer: Self-pay

## 2020-11-20 NOTE — Telephone Encounter (Signed)
Transition Care Management Follow-up Telephone Call Call received from patient Date of discharge and from where: 11/17/2020, [facility] How have you been since you were released from the hospital? He said he is doing pretty well, not too bad actually. He only reports occasional nausea/vomiting.  He is pleased that he has an appointment with GI this week.  Any questions or concerns? No  Items Reviewed: Did the pt receive and understand the discharge instructions provided? Yes  Medications obtained and verified? Yes  - he said he has all medications and didn't have any questions about the med regime. He explained that he has to take them slowly,not all at once so he is better able to tolerate them.  Other? No  Any new allergies since your discharge? No  Dietary orders reviewed? Yes - he said he has been tolerating soft foods.  Do you have support at home?  Lives alone  Home Care and Equipment/Supplies: Were home health services ordered? no If so, what is the name of the agency? N/a  Has the agency set up a time to come to the patient's home? not applicable Were any new equipment or medical supplies ordered?  No What is the name of the medical supply agency? N/a Were you able to get the supplies/equipment? not applicable Do you have any questions related to the use of the equipment or supplies? No  He has a [employer] but he said he really doesn't need that he has been using the traditional glucometer and doing fingersticks.   Functional Questionnaire: (I = Independent and D = Dependent) ADLs: independent.    Follow up appointments reviewed:  PCP Hospital f/u appt confirmed? Yes  Scheduled to see [name], NP on 12/12/2020 @ 0910. Specialist Hospital f/u appt confirmed? Yes  Scheduled to see [facility] on 11/23/2020. Are transportation arrangements needed? No  If their condition worsens, is the pt aware to call PCP or go to the Emergency Dept.? Yes Was the patient  provided with contact information for the PCP's office or ED? Yes Was to pt encouraged to call back with questions or concerns? Yes

## 2020-11-20 NOTE — Telephone Encounter (Signed)
Transition Care Management Unsuccessful Follow-up Telephone Call  Date of discharge and from where:  9/17/202, [facility]   Attempts:  2nd Attempt  Reason for unsuccessful TCM follow-up call:  Left voice message on # [phone].  Patient has appointment with [name], NP @ [facility] 12/12/2020.

## 2020-11-27 ENCOUNTER — Other Ambulatory Visit: Payer: Self-pay | Admitting: Neurology

## 2020-11-27 ENCOUNTER — Other Ambulatory Visit: Payer: Self-pay | Admitting: Nurse Practitioner

## 2020-11-27 DIAGNOSIS — G6289 Other specified polyneuropathies: Secondary | ICD-10-CM

## 2020-11-27 DIAGNOSIS — M720 Palmar fascial fibromatosis [Dupuytren]: Secondary | ICD-10-CM

## 2020-11-27 DIAGNOSIS — G610 Guillain-Barre syndrome: Secondary | ICD-10-CM

## 2020-11-28 ENCOUNTER — Telehealth: Payer: Self-pay | Admitting: Nurse Practitioner

## 2020-11-28 NOTE — Telephone Encounter (Unsigned)
Copied from CRM [id number]. Topic: General - Other >> Nov 28, 2020  9:22 AM Marylen Ponto wrote: Reason for CRM: Pt requests that pcp or her nurse return his call to discuss pain management. Cb# [phone]

## 2020-11-28 NOTE — Telephone Encounter (Signed)
Pt must call GI Dr.for further refills.

## 2020-11-28 NOTE — Telephone Encounter (Signed)
Patient called and says he has been in the hospital 4 times due to pancreatitis since the last visit with Zelda. He says the hospital doctor referred him to the pain clinic and wanted him to be seen this month, but there are no openings until December, so the pain clinic placed him on the waiting list. He says he goes to Dr. Lenoria Farrier, Gastroenterologist at [facility] and was prescribed Tramadol 50 mg to take up to 3 tabs daily. He says the prescription is for 15 tabs/3 refills. He says that will only be 5 days of pills for each refill and he will need more to last until he is seen by the pain clinic. He is wanting to know if Zelda would given him a new prescription to last until then. I advised of his upcoming appointment in 2 weeks with Zelda, advised to keep that appointment. Advised I will send this to Zelda for her review and someone will call back with her recommendation. Patient verbalized understanding.

## 2020-12-01 ENCOUNTER — Inpatient Hospital Stay (HOSPITAL_COMMUNITY)
Admission: EM | Admit: 2020-12-01 | Discharge: 2020-12-14 | DRG: 438 | Disposition: A | Discharge status: Home / Self Care | Payer: Medicaid Other | Attending: Internal Medicine | Admitting: Internal Medicine

## 2020-12-01 ENCOUNTER — Other Ambulatory Visit: Payer: Self-pay

## 2020-12-01 ENCOUNTER — Emergency Department (HOSPITAL_COMMUNITY)
Admission: EM | Admit: 2020-12-01 | Discharge: 2020-12-01 | Discharge status: Against Medical Advice | Payer: Medicaid Other

## 2020-12-01 ENCOUNTER — Encounter (HOSPITAL_COMMUNITY): Payer: Self-pay | Admitting: Emergency Medicine

## 2020-12-01 DIAGNOSIS — K859 Acute pancreatitis without necrosis or infection, unspecified: Secondary | ICD-10-CM

## 2020-12-01 DIAGNOSIS — K861 Other chronic pancreatitis: Secondary | ICD-10-CM | POA: Diagnosis present

## 2020-12-01 DIAGNOSIS — Z86718 Personal history of other venous thrombosis and embolism: Secondary | ICD-10-CM

## 2020-12-01 DIAGNOSIS — Z7901 Long term (current) use of anticoagulants: Secondary | ICD-10-CM

## 2020-12-01 DIAGNOSIS — K760 Fatty (change of) liver, not elsewhere classified: Secondary | ICD-10-CM | POA: Diagnosis present

## 2020-12-01 DIAGNOSIS — E119 Type 2 diabetes mellitus without complications: Secondary | ICD-10-CM

## 2020-12-01 DIAGNOSIS — G8929 Other chronic pain: Secondary | ICD-10-CM | POA: Diagnosis present

## 2020-12-01 DIAGNOSIS — E876 Hypokalemia: Secondary | ICD-10-CM | POA: Diagnosis present

## 2020-12-01 DIAGNOSIS — F17210 Nicotine dependence, cigarettes, uncomplicated: Secondary | ICD-10-CM | POA: Diagnosis present

## 2020-12-01 DIAGNOSIS — F1021 Alcohol dependence, in remission: Secondary | ICD-10-CM | POA: Diagnosis present

## 2020-12-01 DIAGNOSIS — R1110 Vomiting, unspecified: Secondary | ICD-10-CM

## 2020-12-01 DIAGNOSIS — K8591 Acute pancreatitis with uninfected necrosis, unspecified: Principal | ICD-10-CM | POA: Diagnosis present

## 2020-12-01 DIAGNOSIS — G6181 Chronic inflammatory demyelinating polyneuritis: Secondary | ICD-10-CM | POA: Diagnosis present

## 2020-12-01 DIAGNOSIS — Z79899 Other long term (current) drug therapy: Secondary | ICD-10-CM

## 2020-12-01 DIAGNOSIS — Z807 Family history of other malignant neoplasms of lymphoid, hematopoietic and related tissues: Secondary | ICD-10-CM

## 2020-12-01 DIAGNOSIS — Z7984 Long term (current) use of oral hypoglycemic drugs: Secondary | ICD-10-CM

## 2020-12-01 DIAGNOSIS — Z6836 Body mass index (BMI) 36.0-36.9, adult: Secondary | ICD-10-CM

## 2020-12-01 DIAGNOSIS — K863 Pseudocyst of pancreas: Secondary | ICD-10-CM | POA: Diagnosis present

## 2020-12-01 DIAGNOSIS — K567 Ileus, unspecified: Secondary | ICD-10-CM | POA: Diagnosis not present

## 2020-12-01 DIAGNOSIS — Z20822 Contact with and (suspected) exposure to covid-19: Secondary | ICD-10-CM | POA: Diagnosis present

## 2020-12-01 DIAGNOSIS — E43 Unspecified severe protein-calorie malnutrition: Secondary | ICD-10-CM | POA: Diagnosis present

## 2020-12-01 DIAGNOSIS — Z86711 Personal history of pulmonary embolism: Secondary | ICD-10-CM

## 2020-12-01 LAB — CBC WITH DIFFERENTIAL/PLATELET
Abs Immature Granulocytes: 0.06 10*3/uL (ref 0.00–0.07)
Basophils Absolute: 0.1 10*3/uL (ref 0.0–0.1)
Basophils Relative: 1 %
Eosinophils Absolute: 0.1 10*3/uL (ref 0.0–0.5)
Eosinophils Relative: 1 %
HCT: 52.5 % — ABNORMAL HIGH (ref 39.0–52.0)
Hemoglobin: 17.6 g/dL — ABNORMAL HIGH (ref 13.0–17.0)
Immature Granulocytes: 1 %
Lymphocytes Relative: 40 %
Lymphs Abs: 5.2 10*3/uL — ABNORMAL HIGH (ref 0.7–4.0)
MCH: 31.8 pg (ref 26.0–34.0)
MCHC: 33.5 g/dL (ref 30.0–36.0)
MCV: 94.8 fL (ref 80.0–100.0)
Monocytes Absolute: 0.9 10*3/uL (ref 0.1–1.0)
Monocytes Relative: 7 %
Neutro Abs: 6.7 10*3/uL (ref 1.7–7.7)
Neutrophils Relative %: 50 %
Platelets: 248 10*3/uL (ref 150–400)
RBC: 5.54 MIL/uL (ref 4.22–5.81)
RDW: 15.4 % (ref 11.5–15.5)
WBC: 13 10*3/uL — ABNORMAL HIGH (ref 4.0–10.5)
nRBC: 0 % (ref 0.0–0.2)

## 2020-12-01 LAB — CBG MONITORING, ED: Glucose-Capillary: 204 mg/dL — ABNORMAL HIGH (ref 70–99)

## 2020-12-01 MED ORDER — LACTATED RINGERS IV BOLUS
1000.0000 mL | Freq: Once | INTRAVENOUS | Status: AC
  Administered 2020-12-01: 1000 mL via INTRAVENOUS

## 2020-12-01 MED ORDER — HYDROMORPHONE HCL 1 MG/ML IJ SOLN
1.0000 mg | Freq: Once | INTRAMUSCULAR | Status: AC
  Administered 2020-12-01: 1 mg via INTRAVENOUS
  Filled 2020-12-01: qty 1

## 2020-12-01 MED ORDER — ONDANSETRON 4 MG PO TBDP
4.0000 mg | ORAL_TABLET | Freq: Once | ORAL | Status: AC
  Administered 2020-12-01: 4 mg via ORAL
  Filled 2020-12-01: qty 1
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
## 2020-12-01 NOTE — ED Provider Notes (Signed)
Emergency Medicine Provider Triage Evaluation Note  Bryce Mendoza , a 61 y.o. male  was evaluated in triage.  Pt complains of nausea, vomiting, diarrhea, abdominal pain.  Symptoms began 2 days ago.  No associated fevers.  Symptoms of been constant, nothing makes it better or worse.  Feels similar to when he has had pancreatitis and DKA.  Has not taken anything for his symptoms.  Review of Systems  Positive: N/v/d, abd pain Negative: fever  Physical Exam  BP (!) 153/115 (BP Location: Right Arm)   Pulse (!) 113   Temp 98.3 F (36.8 C) (Oral)   Resp 16   Ht 6\' 1"  (1.854 m)   Wt 115.7 kg   SpO2 94%   BMI 33.64 kg/m  Gen:   Awake, no distress   Resp:  Normal effort  MSK:   Moves extremities without difficulty  Other:  Tenderness palpation of the upper abdomen with guarding.  No rigidity or distention.  No tenderness to palpation of lower abdomen.  No CVA tenderness.  Medical Decision Making  Medically screening exam initiated at 9:48 PM.  Appropriate orders placed.  Joevanni Roddey was informed that the remainder of the evaluation will be completed by another provider, this initial triage assessment does not replace that evaluation, and the importance of remaining in the ED until their evaluation is complete.  Labs, ua, ct   [place], PA-C 12/01/20 2149    2150, MD 12/02/20 [phone]

## 2020-12-01 NOTE — ED Triage Notes (Signed)
Patient present with diffuse abdominal pain, with vomiting, and watery diarrhea. Patient with hx of pancreatitis, states it feels the same.

## 2020-12-02 ENCOUNTER — Emergency Department (HOSPITAL_COMMUNITY): Payer: Medicaid Other

## 2020-12-02 ENCOUNTER — Encounter (HOSPITAL_COMMUNITY): Payer: Self-pay

## 2020-12-02 DIAGNOSIS — E119 Type 2 diabetes mellitus without complications: Secondary | ICD-10-CM | POA: Diagnosis not present

## 2020-12-02 DIAGNOSIS — G6181 Chronic inflammatory demyelinating polyneuritis: Secondary | ICD-10-CM

## 2020-12-02 DIAGNOSIS — K859 Acute pancreatitis without necrosis or infection, unspecified: Secondary | ICD-10-CM | POA: Diagnosis not present

## 2020-12-02 DIAGNOSIS — K861 Other chronic pancreatitis: Secondary | ICD-10-CM | POA: Diagnosis not present

## 2020-12-02 DIAGNOSIS — R1110 Vomiting, unspecified: Secondary | ICD-10-CM | POA: Diagnosis not present

## 2020-12-02 DIAGNOSIS — R109 Unspecified abdominal pain: Secondary | ICD-10-CM | POA: Diagnosis not present

## 2020-12-02 DIAGNOSIS — K8591 Acute pancreatitis with uninfected necrosis, unspecified: Secondary | ICD-10-CM | POA: Diagnosis not present

## 2020-12-02 DIAGNOSIS — R1084 Generalized abdominal pain: Secondary | ICD-10-CM | POA: Diagnosis not present

## 2020-12-02 LAB — COMPREHENSIVE METABOLIC PANEL
ALT: 56 U/L — ABNORMAL HIGH (ref 0–44)
ALT: 41 U/L (ref 0–44)
AST: 84 U/L — ABNORMAL HIGH (ref 15–41)
AST: 53 U/L — ABNORMAL HIGH (ref 15–41)
Albumin: 4.5 g/dL (ref 3.5–5.0)
Albumin: 3.5 g/dL (ref 3.5–5.0)
Alkaline Phosphatase: 120 U/L (ref 38–126)
Alkaline Phosphatase: 93 U/L (ref 38–126)
Anion gap: 13 (ref 5–15)
Anion gap: 11 (ref 5–15)
BUN: <5 mg/dL — ABNORMAL LOW (ref 6–20)
BUN: 6 mg/dL (ref 6–20)
CO2: 22 mmol/L (ref 22–32)
CO2: 24 mmol/L (ref 22–32)
Calcium: 9.6 mg/dL (ref 8.9–10.3)
Calcium: 8.9 mg/dL (ref 8.9–10.3)
Chloride: 110 mmol/L (ref 98–111)
Chloride: 107 mmol/L (ref 98–111)
Creatinine, Ser: 0.8 mg/dL (ref 0.61–1.24)
Creatinine, Ser: 0.76 mg/dL (ref 0.61–1.24)
GFR, Estimated: >60 mL/min (ref >60)
GFR, Estimated: >60 mL/min (ref >60)
Glucose, Bld: 211 mg/dL — ABNORMAL HIGH (ref 70–99)
Glucose, Bld: 221 mg/dL — ABNORMAL HIGH (ref 70–99)
Potassium: 3.6 mmol/L (ref 3.5–5.1)
Potassium: 4 mmol/L (ref 3.5–5.1)
Sodium: 145 mmol/L (ref 135–145)
Sodium: 142 mmol/L (ref 135–145)
Total Bilirubin: 0.6 mg/dL (ref 0.3–1.2)
Total Bilirubin: 0.7 mg/dL (ref 0.3–1.2)
Total Protein: 8.9 g/dL — ABNORMAL HIGH (ref 6.5–8.1)
Total Protein: 7 g/dL (ref 6.5–8.1)

## 2020-12-02 LAB — CBC
HCT: 43.2 % (ref 39.0–52.0)
Hemoglobin: 14.7 g/dL (ref 13.0–17.0)
MCH: 32 pg (ref 26.0–34.0)
MCHC: 34 g/dL (ref 30.0–36.0)
MCV: 93.9 fL (ref 80.0–100.0)
Platelets: 191 10*3/uL (ref 150–400)
RBC: 4.6 MIL/uL (ref 4.22–5.81)
RDW: 15.2 % (ref 11.5–15.5)
WBC: 9.8 10*3/uL (ref 4.0–10.5)
nRBC: 0 % (ref 0.0–0.2)

## 2020-12-02 LAB — RESP PANEL BY RT-PCR (FLU A&B, COVID) ARPGX2
Influenza A by PCR: NEGATIVE
Influenza B by PCR: NEGATIVE
SARS Coronavirus 2 by RT PCR: NEGATIVE

## 2020-12-02 LAB — HEMOGLOBIN A1C
Hgb A1c MFr Bld: 6.7 % — ABNORMAL HIGH (ref 4.8–5.6)
Mean Plasma Glucose: 145.59 mg/dL

## 2020-12-02 LAB — URINALYSIS, ROUTINE W REFLEX MICROSCOPIC
Bilirubin Urine: NEGATIVE
Glucose, UA: 50 mg/dL — AB
Hgb urine dipstick: NEGATIVE
Ketones, ur: 5 mg/dL — AB
Leukocytes,Ua: NEGATIVE
Nitrite: NEGATIVE
Protein, ur: 100 mg/dL — AB
Specific Gravity, Urine: 1.019 (ref 1.005–1.030)
pH: 5 (ref 5.0–8.0)

## 2020-12-02 LAB — GLUCOSE, CAPILLARY
Glucose-Capillary: 212 mg/dL — ABNORMAL HIGH (ref 70–99)
Glucose-Capillary: 117 mg/dL — ABNORMAL HIGH (ref 70–99)
Glucose-Capillary: 200 mg/dL — ABNORMAL HIGH (ref 70–99)

## 2020-12-02 LAB — TYPE AND SCREEN
ABO/RH(D): B POS
Antibody Screen: NEGATIVE

## 2020-12-02 LAB — LIPASE, BLOOD: Lipase: 28 U/L (ref 11–51)

## 2020-12-02 MED ORDER — HYDROMORPHONE HCL 1 MG/ML IJ SOLN
1.0000 mg | Freq: Once | INTRAMUSCULAR | Status: AC
  Administered 2020-12-02: 1 mg via INTRAVENOUS
  Filled 2020-12-02: qty 1

## 2020-12-02 MED ORDER — PANCRELIPASE (LIP-PROT-AMYL) 12000-38000 UNITS PO CPEP
12000.0000 [IU] | ORAL_CAPSULE | Freq: Three times a day (TID) | ORAL | Status: DC
  Administered 2020-12-02: 12000 [IU] via ORAL
  Filled 2020-12-02 (×2): qty 1

## 2020-12-02 MED ORDER — DICYCLOMINE HCL 10 MG PO CAPS
10.0000 mg | ORAL_CAPSULE | Freq: Three times a day (TID) | ORAL | Status: DC
  Administered 2020-12-02 – 2020-12-14 (×37): 10 mg via ORAL
  Filled 2020-12-02 (×37): qty 1

## 2020-12-02 MED ORDER — INSULIN ASPART 100 UNIT/ML IJ SOLN
0.0000 [IU] | Freq: Three times a day (TID) | INTRAMUSCULAR | Status: DC
  Administered 2020-12-02: 5 [IU] via SUBCUTANEOUS
  Administered 2020-12-02 – 2020-12-04 (×4): 3 [IU] via SUBCUTANEOUS
  Administered 2020-12-04: 2 [IU] via SUBCUTANEOUS
  Administered 2020-12-04 – 2020-12-06 (×3): 3 [IU] via SUBCUTANEOUS
  Administered 2020-12-07 – 2020-12-12 (×7): 2 [IU] via SUBCUTANEOUS
  Administered 2020-12-12: 3 [IU] via SUBCUTANEOUS
  Administered 2020-12-13 – 2020-12-14 (×3): 2 [IU] via SUBCUTANEOUS
  Administered 2020-12-14: 3 [IU] via SUBCUTANEOUS
  Filled 2020-12-02: qty 0.15

## 2020-12-02 MED ORDER — NORTRIPTYLINE HCL 10 MG PO CAPS
30.0000 mg | ORAL_CAPSULE | Freq: Two times a day (BID) | ORAL | Status: DC
  Administered 2020-12-02 – 2020-12-14 (×26): 30 mg via ORAL
  Filled 2020-12-02 (×27): qty 3

## 2020-12-02 MED ORDER — SENNOSIDES-DOCUSATE SODIUM 8.6-50 MG PO TABS: 1.0000 | ORAL_TABLET | Freq: Two times a day (BID) | ORAL | Status: DC

## 2020-12-02 MED ORDER — IOHEXOL 350 MG/ML SOLN
80.0000 mL | Freq: Once | INTRAVENOUS | Status: AC | PRN
  Administered 2020-12-02: 80 mL via INTRAVENOUS

## 2020-12-02 MED ORDER — SUCRALFATE 1 G PO TABS
1.0000 g | ORAL_TABLET | Freq: Three times a day (TID) | ORAL | Status: DC
  Administered 2020-12-02 – 2020-12-05 (×13): 1 g via ORAL
  Filled 2020-12-02 (×13): qty 1

## 2020-12-02 MED ORDER — TRAZODONE HCL 100 MG PO TABS
100.0000 mg | ORAL_TABLET | Freq: Every evening | ORAL | Status: DC | PRN
  Filled 2020-12-02: qty 1
  Filled 2020-12-02: qty 1.5

## 2020-12-02 MED ORDER — ONDANSETRON HCL 4 MG/2ML IJ SOLN
4.0000 mg | Freq: Four times a day (QID) | INTRAMUSCULAR | Status: DC | PRN
  Administered 2020-12-02 – 2020-12-12 (×13): 4 mg via INTRAVENOUS
  Filled 2020-12-02 (×14): qty 2

## 2020-12-02 MED ORDER — DIPHENHYDRAMINE HCL 25 MG PO CAPS
25.0000 mg | ORAL_CAPSULE | Freq: Once | ORAL | Status: AC
  Administered 2020-12-02: 25 mg via ORAL
  Filled 2020-12-02: qty 1

## 2020-12-02 MED ORDER — ATORVASTATIN CALCIUM 40 MG PO TABS
40.0000 mg | ORAL_TABLET | Freq: Every day | ORAL | Status: DC
  Administered 2020-12-02 – 2020-12-14 (×13): 40 mg via ORAL
  Filled 2020-12-02 (×13): qty 1

## 2020-12-02 MED ORDER — APIXABAN 5 MG PO TABS
5.0000 mg | ORAL_TABLET | Freq: Two times a day (BID) | ORAL | Status: DC
  Administered 2020-12-02 – 2020-12-14 (×25): 5 mg via ORAL
  Filled 2020-12-02 (×26): qty 1

## 2020-12-02 MED ORDER — HYDROMORPHONE HCL 1 MG/ML IJ SOLN
0.5000 mg | INTRAMUSCULAR | Status: DC | PRN
  Administered 2020-12-02 – 2020-12-03 (×5): 0.5 mg via INTRAVENOUS
  Filled 2020-12-02 (×5): qty 0.5

## 2020-12-02 MED ORDER — ADULT MULTIVITAMIN W/MINERALS CH
1.0000 | ORAL_TABLET | Freq: Every day | ORAL | Status: DC
  Administered 2020-12-02 – 2020-12-14 (×13): 1 via ORAL
  Filled 2020-12-02 (×13): qty 1

## 2020-12-02 MED ORDER — PANCRELIPASE (LIP-PROT-AMYL) 12000-38000 UNITS PO CPEP
24000.0000 [IU] | ORAL_CAPSULE | Freq: Three times a day (TID) | ORAL | Status: DC
  Administered 2020-12-02 – 2020-12-14 (×38): 24000 [IU] via ORAL
  Filled 2020-12-02 (×39): qty 2

## 2020-12-02 MED ORDER — SODIUM CHLORIDE 0.9 % IV SOLN: INTRAVENOUS | Status: DC

## 2020-12-02 MED ORDER — ACETAMINOPHEN 325 MG PO TABS: 650.0000 mg | ORAL_TABLET | Freq: Four times a day (QID) | ORAL | Status: DC | PRN

## 2020-12-02 MED ORDER — ONDANSETRON HCL 4 MG PO TABS
4.0000 mg | ORAL_TABLET | Freq: Four times a day (QID) | ORAL | Status: DC | PRN
  Administered 2020-12-04 – 2020-12-11 (×5): 4 mg via ORAL
  Filled 2020-12-02 (×5): qty 1

## 2020-12-02 MED ORDER — PANTOPRAZOLE SODIUM 40 MG PO TBEC
40.0000 mg | DELAYED_RELEASE_TABLET | Freq: Every day | ORAL | Status: DC
  Administered 2020-12-02 – 2020-12-14 (×13): 40 mg via ORAL
  Filled 2020-12-02 (×14): qty 1

## 2020-12-02 MED ORDER — OXYCODONE HCL 5 MG PO TABS
10.0000 mg | ORAL_TABLET | ORAL | Status: DC | PRN
  Administered 2020-12-02 – 2020-12-14 (×54): 10 mg via ORAL
  Filled 2020-12-02 (×57): qty 2

## 2020-12-02 MED ORDER — ACETAMINOPHEN 650 MG RE SUPP: 650.0000 mg | Freq: Four times a day (QID) | RECTAL | Status: DC | PRN

## 2020-12-02 MED ORDER — GLIPIZIDE 5 MG PO TABS
5.0000 mg | ORAL_TABLET | Freq: Every day | ORAL | Status: DC
  Filled 2020-12-02: qty 1

## 2020-12-02 NOTE — Progress Notes (Signed)
I have seen and assessed patient and I agree with Dr. [place] Service assessment and plan.  Patient is a 60 year old gentleman history of chronic pancreatitis, prior history of DVT/PE on Eliquis, recovering alcoholic last drink in July presenting with acute on chronic abdominal pain, nausea vomiting.  General surgery and GI consulted and following.  GI recommended MRI of the abdomen for further evaluation. Continue clear liquids, aggressive fluid resuscitation, pain management.  No charge.
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
## 2020-12-03 ENCOUNTER — Observation Stay (HOSPITAL_COMMUNITY): Payer: Medicaid Other

## 2020-12-03 DIAGNOSIS — K567 Ileus, unspecified: Secondary | ICD-10-CM | POA: Diagnosis not present

## 2020-12-03 DIAGNOSIS — K861 Other chronic pancreatitis: Secondary | ICD-10-CM | POA: Diagnosis not present

## 2020-12-03 DIAGNOSIS — Z807 Family history of other malignant neoplasms of lymphoid, hematopoietic and related tissues: Secondary | ICD-10-CM | POA: Diagnosis not present

## 2020-12-03 DIAGNOSIS — E43 Unspecified severe protein-calorie malnutrition: Secondary | ICD-10-CM | POA: Diagnosis present

## 2020-12-03 DIAGNOSIS — R1030 Lower abdominal pain, unspecified: Secondary | ICD-10-CM | POA: Diagnosis not present

## 2020-12-03 DIAGNOSIS — K8591 Acute pancreatitis with uninfected necrosis, unspecified: Secondary | ICD-10-CM | POA: Diagnosis not present

## 2020-12-03 DIAGNOSIS — Z6836 Body mass index (BMI) 36.0-36.9, adult: Secondary | ICD-10-CM | POA: Diagnosis not present

## 2020-12-03 DIAGNOSIS — Z86711 Personal history of pulmonary embolism: Secondary | ICD-10-CM | POA: Diagnosis not present

## 2020-12-03 DIAGNOSIS — F1021 Alcohol dependence, in remission: Secondary | ICD-10-CM | POA: Diagnosis present

## 2020-12-03 DIAGNOSIS — K760 Fatty (change of) liver, not elsewhere classified: Secondary | ICD-10-CM | POA: Diagnosis not present

## 2020-12-03 DIAGNOSIS — R1084 Generalized abdominal pain: Secondary | ICD-10-CM | POA: Diagnosis not present

## 2020-12-03 DIAGNOSIS — Z20822 Contact with and (suspected) exposure to covid-19: Secondary | ICD-10-CM | POA: Diagnosis present

## 2020-12-03 DIAGNOSIS — Z79899 Other long term (current) drug therapy: Secondary | ICD-10-CM | POA: Diagnosis not present

## 2020-12-03 DIAGNOSIS — R112 Nausea with vomiting, unspecified: Secondary | ICD-10-CM | POA: Diagnosis not present

## 2020-12-03 DIAGNOSIS — G6181 Chronic inflammatory demyelinating polyneuritis: Secondary | ICD-10-CM | POA: Diagnosis not present

## 2020-12-03 DIAGNOSIS — Z7984 Long term (current) use of oral hypoglycemic drugs: Secondary | ICD-10-CM | POA: Diagnosis not present

## 2020-12-03 DIAGNOSIS — R935 Abnormal findings on diagnostic imaging of other abdominal regions, including retroperitoneum: Secondary | ICD-10-CM | POA: Diagnosis not present

## 2020-12-03 DIAGNOSIS — G8929 Other chronic pain: Secondary | ICD-10-CM | POA: Diagnosis present

## 2020-12-03 DIAGNOSIS — E119 Type 2 diabetes mellitus without complications: Secondary | ICD-10-CM | POA: Diagnosis not present

## 2020-12-03 DIAGNOSIS — E876 Hypokalemia: Secondary | ICD-10-CM | POA: Diagnosis present

## 2020-12-03 DIAGNOSIS — K859 Acute pancreatitis without necrosis or infection, unspecified: Secondary | ICD-10-CM | POA: Diagnosis not present

## 2020-12-03 DIAGNOSIS — F17210 Nicotine dependence, cigarettes, uncomplicated: Secondary | ICD-10-CM | POA: Diagnosis present

## 2020-12-03 DIAGNOSIS — R1110 Vomiting, unspecified: Secondary | ICD-10-CM | POA: Diagnosis not present

## 2020-12-03 DIAGNOSIS — Z7901 Long term (current) use of anticoagulants: Secondary | ICD-10-CM | POA: Diagnosis not present

## 2020-12-03 DIAGNOSIS — K863 Pseudocyst of pancreas: Secondary | ICD-10-CM | POA: Diagnosis not present

## 2020-12-03 DIAGNOSIS — Z86718 Personal history of other venous thrombosis and embolism: Secondary | ICD-10-CM | POA: Diagnosis not present

## 2020-12-03 LAB — GLUCOSE, CAPILLARY
Glucose-Capillary: 156 mg/dL — ABNORMAL HIGH (ref 70–99)
Glucose-Capillary: 157 mg/dL — ABNORMAL HIGH (ref 70–99)
Glucose-Capillary: 151 mg/dL — ABNORMAL HIGH (ref 70–99)
Glucose-Capillary: 173 mg/dL — ABNORMAL HIGH (ref 70–99)

## 2020-12-03 LAB — CBC WITH DIFFERENTIAL/PLATELET
Abs Immature Granulocytes: 0.04 10*3/uL (ref 0.00–0.07)
Basophils Absolute: 0 10*3/uL (ref 0.0–0.1)
Basophils Relative: 1 %
Eosinophils Absolute: 0.2 10*3/uL (ref 0.0–0.5)
Eosinophils Relative: 3 %
HCT: 41.4 % (ref 39.0–52.0)
Hemoglobin: 13.8 g/dL (ref 13.0–17.0)
Immature Granulocytes: 1 %
Lymphocytes Relative: 42 %
Lymphs Abs: 2.3 10*3/uL (ref 0.7–4.0)
MCH: 31.6 pg (ref 26.0–34.0)
MCHC: 33.3 g/dL (ref 30.0–36.0)
MCV: 94.7 fL (ref 80.0–100.0)
Monocytes Absolute: 0.5 10*3/uL (ref 0.1–1.0)
Monocytes Relative: 9 %
Neutro Abs: 2.4 10*3/uL (ref 1.7–7.7)
Neutrophils Relative %: 44 %
Platelets: 146 10*3/uL — ABNORMAL LOW (ref 150–400)
RBC: 4.37 MIL/uL (ref 4.22–5.81)
RDW: 15.3 % (ref 11.5–15.5)
WBC: 5.4 10*3/uL (ref 4.0–10.5)
nRBC: 0 % (ref 0.0–0.2)

## 2020-12-03 LAB — COMPREHENSIVE METABOLIC PANEL
ALT: 29 U/L (ref 0–44)
AST: 38 U/L (ref 15–41)
Albumin: 2.9 g/dL — ABNORMAL LOW (ref 3.5–5.0)
Alkaline Phosphatase: 77 U/L (ref 38–126)
Anion gap: 8 (ref 5–15)
BUN: <5 mg/dL — ABNORMAL LOW (ref 6–20)
CO2: 24 mmol/L (ref 22–32)
Calcium: 8.1 mg/dL — ABNORMAL LOW (ref 8.9–10.3)
Chloride: 105 mmol/L (ref 98–111)
Creatinine, Ser: 0.65 mg/dL (ref 0.61–1.24)
GFR, Estimated: >60 mL/min (ref >60)
Glucose, Bld: 156 mg/dL — ABNORMAL HIGH (ref 70–99)
Potassium: 3.4 mmol/L — ABNORMAL LOW (ref 3.5–5.1)
Sodium: 137 mmol/L (ref 135–145)
Total Bilirubin: 1 mg/dL (ref 0.3–1.2)
Total Protein: 5.7 g/dL — ABNORMAL LOW (ref 6.5–8.1)

## 2020-12-03 LAB — PHOSPHORUS: Phosphorus: 3.1 mg/dL (ref 2.5–4.6)

## 2020-12-03 LAB — LIPASE, BLOOD: Lipase: 25 U/L (ref 11–51)

## 2020-12-03 LAB — MAGNESIUM: Magnesium: 1.5 mg/dL — ABNORMAL LOW (ref 1.7–2.4)

## 2020-12-03 MED ORDER — GADOBUTROL 1 MMOL/ML IV SOLN
10.0000 mL | Freq: Once | INTRAVENOUS | Status: AC | PRN
  Administered 2020-12-03: 10 mL via INTRAVENOUS

## 2020-12-03 MED ORDER — POTASSIUM CHLORIDE CRYS ER 20 MEQ PO TBCR
40.0000 meq | EXTENDED_RELEASE_TABLET | Freq: Once | ORAL | Status: AC
  Administered 2020-12-03: 40 meq via ORAL
  Filled 2020-12-03: qty 2

## 2020-12-03 MED ORDER — SODIUM CHLORIDE 0.9 % IV SOLN
1.0000 g | Freq: Three times a day (TID) | INTRAVENOUS | Status: DC
  Filled 2020-12-03: qty 1

## 2020-12-03 MED ORDER — MAGNESIUM SULFATE 4 GM/100ML IV SOLN
4.0000 g | Freq: Once | INTRAVENOUS | Status: AC
  Administered 2020-12-03: 4 g via INTRAVENOUS
  Filled 2020-12-03: qty 100

## 2020-12-03 MED ORDER — HYDROMORPHONE HCL 1 MG/ML IJ SOLN
1.0000 mg | INTRAMUSCULAR | Status: DC | PRN
Start: 2020-12-03 — End: 2020-12-12
  Administered 2020-12-03 – 2020-12-12 (×44): 1 mg via INTRAVENOUS
  Filled 2020-12-03 (×44): qty 1

## 2020-12-03 MED ORDER — ENSURE ENLIVE PO LIQD
237.0000 mL | Freq: Three times a day (TID) | ORAL | Status: DC
  Administered 2020-12-03 – 2020-12-14 (×21): 237 mL via ORAL

## 2020-12-03 NOTE — Progress Notes (Signed)
Progress Note     Subjective: Feels like pain control is improving on current regimen. He is tolerating clears well. Passing flatus. No nausea or emesis.   Objective: Vital signs in last 24 hours: Temp:  [97 F (36.1 C)-97.6 F (36.4 C)] 97 F (36.1 C) (10/03 0500) Pulse Rate:  [67-72] 67 (10/03 0500) Resp:  [16-18] 18 (10/03 0500) BP: (109-134)/(78-81) 118/81 (10/03 0500) SpO2:  [96 %-100 %] 96 % (10/03 0500) Last BM Date: 12/02/20  Intake/Output from previous day: 10/02 0701 - 10/03 0700 In: 3481.6 [P.O.:358; I.V.:3123.6] Out: 100 [Urine:100] Intake/Output this shift: No intake/output data recorded.  PE: General: pleasant, WD, male who is laying in bed in NAD HEENT: head is normocephalic, atraumatic. Mouth is pink and moist Heart: regular, rate, and rhythm. Palpable radial and pedal pulses bilaterally Lungs: Respiratory effort nonlabored on room air Abd: overall soft, ND, +BS, mild tenderness to palpation over epigastrium - no rebound or guarding MSK: all 4 extremities are symmetrical with no cyanosis, clubbing, or edema. Skin: warm and dry with no masses, lesions, or rashes Psych: A&Ox3 with an appropriate affect.    Lab Results:  Recent Labs    12/02/20 0756 12/03/20 0835  WBC 9.8 5.4  HGB 14.7 13.8  HCT 43.2 41.4  PLT 191 146*   BMET Recent Labs    12/02/20 0756 12/03/20 0835  NA 142 137  K 4.0 3.4*  CL 107 105  CO2 24 24  GLUCOSE 221* 156*  BUN 6 <5*  CREATININE 0.76 0.65  CALCIUM 8.9 8.1*   PT/INR No results for input(s): LABPROT, INR in the last 72 hours. CMP     Component Value Date/Time   NA 137 12/03/2020 0835   NA 137 07/12/2020 1341   K 3.4 (L) 12/03/2020 0835   CL 105 12/03/2020 0835   CO2 24 12/03/2020 0835   GLUCOSE 156 (H) 12/03/2020 0835   BUN <5 (L) 12/03/2020 0835   BUN 7 07/12/2020 1341   CREATININE 0.65 12/03/2020 0835   CALCIUM 8.1 (L) 12/03/2020 0835   PROT 5.7 (L) 12/03/2020 0835   PROT 7.0 07/12/2020 1341    ALBUMIN 2.9 (L) 12/03/2020 0835   ALBUMIN 4.4 07/12/2020 1341   AST 38 12/03/2020 0835   ALT 29 12/03/2020 0835   ALKPHOS 77 12/03/2020 0835   BILITOT 1.0 12/03/2020 0835   BILITOT 0.6 07/12/2020 1341   GFRNONAA >60 12/03/2020 0835   GFRAA >60 08/17/2019 1832   Lipase     Component Value Date/Time   LIPASE 25 12/03/2020 0835       Studies/Results: CT ABDOMEN PELVIS W CONTRAST  Result Date: 12/02/2020 CLINICAL DATA:  Nausea, vomiting and epigastric pain. EXAM: CT ABDOMEN AND PELVIS WITH CONTRAST TECHNIQUE: Multidetector CT imaging of the abdomen and pelvis was performed using the standard protocol following bolus administration of intravenous contrast. CONTRAST:  51mL OMNIPAQUE IOHEXOL 350 MG/ML SOLN COMPARISON:  October 30, 2020 FINDINGS: Lower chest: Mild linear atelectasis is seen within the left lung base. Hepatobiliary: There is diffuse fatty infiltration of the liver parenchyma. No focal liver abnormality is seen. No gallstones, gallbladder wall thickening, or biliary dilatation. Pancreas: Evidence of necrotizing pancreatitis is again seen. A 10.2 cm x 3.4 cm fluid collection is seen within the region of the pancreatic body and tail. This is mildly decreased in size when compared to the prior study (measured approximately 11.8 cm x 3.5 cm on the prior study). An additional 2.7 cm x 2.1 cm fluid collection is  seen inferior and anterior to the pancreatic head. This measures approximately 3.5 cm x 2.2 cm on the prior study. Spleen: Normal in size without focal abnormality. Adrenals/Urinary Tract: Adrenal glands are unremarkable. Kidneys are normal, without renal calculi, focal lesion, or hydronephrosis. Bladder is unremarkable. Stomach/Bowel: Stomach is within normal limits. The appendix is not visualized. No evidence of bowel wall thickening, distention, or inflammatory changes. Vascular/Lymphatic: Aortic atherosclerosis. No enlarged abdominal or pelvic lymph nodes. Reproductive: Prostate is  unremarkable. Other: No abdominal wall hernia or abnormality. No abdominopelvic ascites. Musculoskeletal: No acute or significant osseous findings. IMPRESSION: 1. Evidence of necrotizing pancreatitis involving the pancreatic body and tail. 2. Interval decrease in size of peripancreatic fluid collections, as described above. 3. Fatty liver. 4. Aortic atherosclerosis. Aortic Atherosclerosis (ICD10-I70.0). Electronically Signed   By: Aram Candela M.D.   On: 12/02/2020 01:06   MR 3D Recon At Scanner  Result Date: 12/03/2020 CLINICAL DATA:  60 year old male with history of abdominal pain. Pancreatitis. EXAM: MRI ABDOMEN WITHOUT AND WITH CONTRAST (INCLUDING MRCP) TECHNIQUE: Multiplanar multisequence MR imaging of the abdomen was performed both before and after the administration of intravenous contrast. Heavily T2-weighted images of the biliary and pancreatic ducts were obtained, and three-dimensional MRCP images were rendered by post processing. CONTRAST:  21mL GADAVIST GADOBUTROL 1 MMOL/ML IV SOLN COMPARISON:  No prior abdominal MRI. CT the abdomen and pelvis 12/02/2020. FINDINGS: Lower chest: Areas of increased signal intensity in the dependent portions of the lungs bilaterally, likely to reflect areas of dependent subsegmental atelectasis but poorly evaluated on today's magnetic resonance examination. Hepatobiliary: Diffuse loss of signal intensity throughout the hepatic parenchyma on out of phase dual echo images, indicative of a background of severe hepatic steatosis. No suspicious cystic or solid hepatic lesions. No intra or extrahepatic biliary ductal dilatation noted on MRCP images. Common bile duct measures 4 mm in the porta hepatis. No filling defect in the common bile duct to suggest choledocholithiasis. Gallbladder is normal in appearance. Pancreas: As noted on the recent CT examination, the body and tail of the pancreas are severely atrophied, and the normal pancreatic parenchyma is largely replaced  with a complex fluid collection which demonstrates heterogeneous T1 and T2 signal intensity, with no significant internal enhancement on post gadolinium imaging, compatible with widespread areas of pancreatic necrosis and complex pancreatic pseudocyst. This measures approximately 11.2 x 3.8 x 3.6 cm. In the inferior aspect of the pancreatic head anteriorly there is a smaller 2.2 x 1.1 cm collection (axial image 28 of series 16) with similar imaging characteristics. Spleen:  Unremarkable. Adrenals/Urinary Tract: Bilateral kidneys and adrenal glands are normal in appearance. No hydroureteronephrosis in the visualized portions of the abdomen. Stomach/Bowel: Visualized portions are unremarkable. Vascular/Lymphatic: No aneurysm identified in the visualized abdominal vasculature. Splenic vein remains patent at this time. Other: No significant volume of ascites noted in the visualized portions of the peritoneal cavity. Musculoskeletal: No aggressive appearing osseous lesions are noted in the visualized portions of the skeleton. IMPRESSION: 1. Necrotizing pancreatitis with complex pancreatic pseudocysts in the head, body and tail of the pancreas, as detailed above. 2. Severe hepatic steatosis. Electronically Signed   By: Trudie Reed M.D.   On: 12/03/2020 07:13   MR ABDOMEN MRCP W WO CONTAST  Result Date: 12/03/2020 CLINICAL DATA:  60 year old male with history of abdominal pain. Pancreatitis. EXAM: MRI ABDOMEN WITHOUT AND WITH CONTRAST (INCLUDING MRCP) TECHNIQUE: Multiplanar multisequence MR imaging of the abdomen was performed both before and after the administration of intravenous contrast. Heavily T2-weighted  images of the biliary and pancreatic ducts were obtained, and three-dimensional MRCP images were rendered by post processing. CONTRAST:  10mL GADAVIST GADOBUTROL 1 MMOL/ML IV SOLN COMPARISON:  No prior abdominal MRI. CT the abdomen and pelvis 12/02/2020. FINDINGS: Lower chest: Areas of increased signal  intensity in the dependent portions of the lungs bilaterally, likely to reflect areas of dependent subsegmental atelectasis but poorly evaluated on today's magnetic resonance examination. Hepatobiliary: Diffuse loss of signal intensity throughout the hepatic parenchyma on out of phase dual echo images, indicative of a background of severe hepatic steatosis. No suspicious cystic or solid hepatic lesions. No intra or extrahepatic biliary ductal dilatation noted on MRCP images. Common bile duct measures 4 mm in the porta hepatis. No filling defect in the common bile duct to suggest choledocholithiasis. Gallbladder is normal in appearance. Pancreas: As noted on the recent CT examination, the body and tail of the pancreas are severely atrophied, and the normal pancreatic parenchyma is largely replaced with a complex fluid collection which demonstrates heterogeneous T1 and T2 signal intensity, with no significant internal enhancement on post gadolinium imaging, compatible with widespread areas of pancreatic necrosis and complex pancreatic pseudocyst. This measures approximately 11.2 x 3.8 x 3.6 cm. In the inferior aspect of the pancreatic head anteriorly there is a smaller 2.2 x 1.1 cm collection (axial image 28 of series 16) with similar imaging characteristics. Spleen:  Unremarkable. Adrenals/Urinary Tract: Bilateral kidneys and adrenal glands are normal in appearance. No hydroureteronephrosis in the visualized portions of the abdomen. Stomach/Bowel: Visualized portions are unremarkable. Vascular/Lymphatic: No aneurysm identified in the visualized abdominal vasculature. Splenic vein remains patent at this time. Other: No significant volume of ascites noted in the visualized portions of the peritoneal cavity. Musculoskeletal: No aggressive appearing osseous lesions are noted in the visualized portions of the skeleton. IMPRESSION: 1. Necrotizing pancreatitis with complex pancreatic pseudocysts in the head, body and tail  of the pancreas, as detailed above. 2. Severe hepatic steatosis. Electronically Signed   By: Trudie Reed M.D.   On: 12/03/2020 07:13    Anti-infectives: Anti-infectives (From admission, onward)    None        Assessment/Plan Necrotizing pancreatitis with pseudocysts - pain improving. No role for acute surgical debridement at this time. Diet advancement as per GI and primary team  We will sign off but please do not hesitate to reach out with any further needs    LOS: 0 days    Keilen Form, [facility] 12/03/2020, 9:43 AM Please see Amion for pager number during day hours 7:00am-4:30pm

## 2020-12-03 NOTE — Progress Notes (Signed)
Initial Nutrition Assessment  DOCUMENTATION CODES:   Severe malnutrition in context of acute illness/injury  INTERVENTION:   Ensure Enlive po TID, each supplement provides 350 kcal and 20 grams of protein  Advance diet as medically appropriate per MD  Encourage good PO intake  NUTRITION DIAGNOSIS:   Severe Malnutrition related to acute illness (Pancreatitis) as evidenced by energy intake < or equal to 50% for > or equal to 5 days, mild muscle depletion, percent weight loss.  GOAL:   Patient will meet greater than or equal to 90% of their needs  MONITOR:   PO intake, Diet advancement, Supplement acceptance, Weight trends  REASON FOR ASSESSMENT:   Malnutrition Screening Tool    ASSESSMENT:   60 y.o. male presented to the ED with LUQ pain and increased N/V/D. PMH of T2DM, EtOH abuse, and reoccurring pancreatitis. Pt has been hospitalized multiple times over the past several months with symptoms of pancreatitis.   10/2: CLD 10/3: FLD  Pt reports that his appetite was okay up until 2-3 days prior to admission. Pt reports his typical intake of; Breakfast: Boost or Ensure shake; Lunch: canned chicken and some rice; Dinner: "varies", ONS or Atkins bar. When asked why he only really eats one meal per day, pt stated that he has not had the energy to cook. He reports ordering ground [place] and items to make spaghetti with meat sauce on [employer] to cook, but then GI symptoms began.   Pt reports a UBW over 300# and reports now weighing ~270# since July. Pt reports that he never thought that he would be over 300# and is not mad about the current weight loss. Stated that he is not trying to stop the weight loss. Per EMR, pt has had a 6% weight loss in ~2 months, which is clinically significant.   Discussed the uses of ONS with pt and that our goal is to eat food first and drink the supplement between meals. Pt reports no difficulty ordering meals.  Medications reviewed and include:  Eliquis, MVI, Protonix, SSI 0-15 units w/ meals, Creon, Potassium Chloride, Carafate, Magnesium Sulfate  Labs reviewed: Potassium 3.4, Magnesium 1.5, Hgb A1c 6.7%    NUTRITION - FOCUSED PHYSICAL EXAM:  Flowsheet Row Most Recent Value  Orbital Region No depletion  Upper Arm Region No depletion  Thoracic and Lumbar Region No depletion  Buccal Region Unable to assess  [Pt wearing mask]  Temple Region Mild depletion  Clavicle Bone Region No depletion  Clavicle and Acromion Bone Region No depletion  Scapular Bone Region No depletion  Dorsal Hand No depletion  Patellar Region Mild depletion  Anterior Thigh Region Mild depletion  Posterior Calf Region Mild depletion  Edema (RD Assessment) Mild  Hair Reviewed  Eyes Reviewed  Mouth Unable to assess  [Pt wearing mask]  Skin Reviewed  Nails Reviewed  [Pale, poor capillary refil]       Diet Order:   Diet Order             Diet full liquid Room service appropriate? Yes; Fluid consistency: Thin  Diet effective now                   EDUCATION NEEDS:   No education needs have been identified at this time  Skin:  Skin Assessment: Reviewed RN Assessment  Last BM:  12/02/20 - Type 7  Height:   Ht Readings from Last 1 Encounters:  12/01/20 6\' 1"  (1.854 m)    Weight:   Wt Readings  from Last 1 Encounters:  12/02/20 121.2 kg    Ideal Body Weight:  83.6 kg  BMI:  Body mass index is 35.25 kg/m.  Estimated Nutritional Needs:   Kcal:  2703-5009  Protein:  115-130 grams  Fluid:  >/= 2.3 L    Iridian Reader BS, PLDN Clinical Dietitian See [facility] for contact information.

## 2020-12-03 NOTE — Progress Notes (Signed)
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  CT done concerning for necrotizing pancreatitis with pseudocyst.  Patient admitted for acute on chronic pancreatitis flare, pain management.  Patient seen in consultation by GI and general surgery.  MRI MRCP abdomen done with necrotizing pancreatitis with complex pancreatic pseudocyst in the head, body and tail of the pancreas.  Severe hepatic steatosis.   Assessment & Plan:   Principal Problem:   Chronic pancreatitis (HCC) Active Problems:   CIDP (chronic inflammatory demyelinating polyneuropathy) (HCC)   Necrotizing pancreatitis   DM type 2 (diabetes mellitus, type 2) (HCC)   Protein-calorie malnutrition, severe   #1 acute on chronic pancreatitis -Patient presenting nausea, vomiting, abdominal pain greater in the epigastrium, inability to keep anything down. -Patient with multiple recent hospitalizations for similar issues however states get discharged on oxycodone which somewhat helps with the pain but only receives 5 days worth of pain treatment. -Patient stated has been scheduled to follow-up with a pain management specialist however that seen a few months and pain currently uncontrolled. -Tolerating clear liquids. -CT abdomen and pelvis done concerning for necrotizing pancreatitis and pseudocyst. -MRI/MRCP abdomen done with necrotizing pancreatitis with complex pancreatic pseudocyst in the head, body, tail of the  pancreas -Currently afebrile with normal white count. -Patient seen by GI who are recommending diet advancement to full liquids, pain management.  Initially IV antibiotics were to be started however discontinued per GI recommendations. -Patient seen in consultation by general surgery who feel no surgical intervention needed at this time and have signed off. -Continue IV fluids, pain management, supportive care, pancreatic enzymes. -Consulted palliative care to help with symptom management. -Hopefully on discharge patient should be able to follow-up with PCP closely who should be able to manage pain since pain medication until his appointment with pain specialist. -Supportive care.  2.  CIDP -Pamelor.  3.  History of DVT/PE -Eliquis.  4.  Diabetes mellitus type 2 -Hemoglobin A1c 6.7 (12/01/2020). -CBG 157 this morning. -SSI. -Continue to hold oral hypoglycemic agents.  5.  Hypokalemia/hypomagnesemia -KDur 40 mEq p.o. x1. -Magnesium sulfate 4 g IV x1. -Repeat labs in the morning.  6.  Severe protein calorie malnutrition -We will place on nutritional supplementation.   DVT prophylaxis: Eliquis Code Status: Full Family Communication: Updated patient.  No family at bedside Disposition:   Status is: Inpatient  The patient will require care spanning > 2 midnights and should be moved to inpatient because: Inpatient level of care appropriate due to severity of illness  Dispo: The patient is from: Home              Anticipated d/c is to: Home              Patient currently is not medically stable to d/c.   Difficult to place patient No       Consultants:  Gastroenterology: Dr.Magod 12/02/2020 General surgery: Dr. Luisa Hart 12/02/2020  Procedures: CT abdomen and pelvis 12/02/2020 MRI/MRCP abdomen 12/03/2020   Antimicrobials: None   Subjective: Sitting up in bed.  Just returning  from MRI abdomen.  States he is tolerating clear liquids.  States pain currently better  controlled on current regimen than on admission.  No nausea or vomiting.  Objective: Vitals:   12/02/20 1141 12/02/20 2008 12/03/20 0500 12/03/20 1408  BP: 134/80 109/78 118/81 122/86  Pulse: 72 68 67 69  Resp: 16 18 18 17   Temp: (!) 97.5 F (36.4 C) 97.6 F (36.4 C) (!) 97 F (36.1 C) 97.6 F (36.4 C)  TempSrc: Oral Oral Oral Oral  SpO2: 100% 97% 96% 98%  Weight:      Height:        Intake/Output Summary (Last 24 hours) at 12/03/2020 1923 Last data filed at 12/03/2020 1906 Gross per 24 hour  Intake 4935.02 ml  Output 850 ml  Net 4085.02 ml   Filed Weights   12/01/20 2135 12/02/20 0820  Weight: 115.7 kg 121.2 kg    Examination:  General exam: Appears calm and comfortable  Respiratory system: Clear to auscultation. Respiratory effort normal. Cardiovascular system: S1 & S2 heard, RRR. No JVD, murmurs, rubs, gallops or clicks. No pedal edema. Gastrointestinal system: Abdomen is nondistended, soft and some diffuse tenderness to palpation greater in the epigastrium.  Positive bowel sounds.  Central nervous system: Alert and oriented. No focal neurological deficits. Extremities: Symmetric 5 x 5 power. Skin: No rashes, lesions or ulcers Psychiatry: Judgement and insight appear normal. Mood & affect appropriate.     Data Reviewed: I have personally reviewed following labs and imaging studies  CBC: Recent Labs  Lab 12/01/20 2244 12/02/20 0756 12/03/20 0835  WBC 13.0* 9.8 5.4  NEUTROABS 6.7  --  2.4  HGB 17.6* 14.7 13.8  HCT 52.5* 43.2 41.4  MCV 94.8 93.9 94.7  PLT 248 191 146*    Basic Metabolic Panel: Recent Labs  Lab 12/01/20 2244 12/02/20 0756 12/03/20 0835  NA 145 142 137  K 3.6 4.0 3.4*  CL 110 107 105  CO2 22 24 24   GLUCOSE 211* 221* 156*  BUN <5* 6 <5*  CREATININE 0.80 0.76 0.65  CALCIUM 9.6 8.9 8.1*  MG  --   --  1.5*  PHOS  --   --  3.1    GFR: Estimated Creatinine Clearance: 137.2 mL/min (by C-G formula based on SCr of 0.65  mg/dL).  Liver Function Tests: Recent Labs  Lab 12/01/20 2244 12/02/20 0756 12/03/20 0835  AST 84* 53* 38  ALT 56* 41 29  ALKPHOS 120 93 77  BILITOT 0.6 0.7 1.0  PROT 8.9* 7.0 5.7*  ALBUMIN 4.5 3.5 2.9*    CBG: Recent Labs  Lab 12/02/20 1131 12/02/20 1634 12/02/20 2150 12/03/20 0737 12/03/20 1201  GLUCAP 156* 117* 200* 157* 151*     Recent Results (from the past 240 hour(s))  Resp Panel by RT-PCR (Flu A&B, Covid) Nasopharyngeal Swab     Status: None   Collection Time: 12/02/20  2:31 AM   Specimen: Nasopharyngeal Swab; Nasopharyngeal(NP) swabs in vial transport medium  Result Value Ref Range Status   SARS Coronavirus 2 by RT PCR NEGATIVE NEGATIVE Final    Comment: (NOTE) SARS-CoV-2 target nucleic acids are NOT DETECTED.  The SARS-CoV-2 RNA is generally detectable in upper respiratory specimens during the acute phase of infection. The lowest concentration of SARS-CoV-2 viral copies this assay can detect is 138 copies/mL. A negative result does not preclude SARS-Cov-2 infection and should not be used as the sole basis for treatment or other patient management decisions. A negative result may occur with  improper specimen collection/handling, submission of specimen other than nasopharyngeal swab, presence of viral mutation(s) within the areas targeted by this assay, and inadequate number of viral copies(<138 copies/mL). A negative result must be combined with clinical observations, patient history, and epidemiological information. The expected result is Negative.  Fact Sheet for Patients:  BloggerCourse.com  Fact Sheet for Healthcare Providers:  SeriousBroker.it  This test is no t yet approved or cleared by the [place] FDA and  has been authorized for detection and/or diagnosis of SARS-CoV-2 by FDA under an Emergency Use Authorization (EUA). This EUA will remain  in effect (meaning this test can be used) for  the duration of the COVID-19 declaration under Section 564(b)(1) of the Act, 21 U.S.C.section 360bbb-3(b)(1), unless the authorization is terminated  or revoked sooner.       Influenza A by PCR NEGATIVE NEGATIVE Final   Influenza B by PCR NEGATIVE NEGATIVE Final    Comment: (NOTE) The Xpert Xpress SARS-CoV-2/FLU/RSV plus assay is intended as an aid in the diagnosis of influenza from Nasopharyngeal swab specimens and should not be used as a sole basis for treatment. Nasal washings and aspirates are unacceptable for Xpert Xpress SARS-CoV-2/FLU/RSV testing.  Fact Sheet for Patients: BloggerCourse.com  Fact Sheet for Healthcare Providers: SeriousBroker.it  This test is not yet approved or cleared by the [place] FDA and has been authorized for detection and/or diagnosis of SARS-CoV-2 by FDA under an Emergency Use Authorization (EUA). This EUA will remain in effect (meaning this test can be used) for the duration of the COVID-19 declaration under Section 564(b)(1) of the Act, 21 U.S.C. section 360bbb-3(b)(1), unless the authorization is terminated or revoked.  Performed at [facility], [address], [place], [place] [address]          Radiology Studies: CT ABDOMEN PELVIS W CONTRAST  Result Date: 12/02/2020 CLINICAL DATA:  Nausea, vomiting and epigastric pain. EXAM: CT ABDOMEN AND PELVIS WITH CONTRAST TECHNIQUE: Multidetector CT imaging of the abdomen and pelvis was performed using the standard protocol following bolus administration of intravenous contrast. CONTRAST:  52mL OMNIPAQUE IOHEXOL 350 MG/ML SOLN COMPARISON:  October 30, 2020 FINDINGS: Lower chest: Mild linear atelectasis is seen within the left lung base. Hepatobiliary: There is diffuse fatty infiltration of the liver parenchyma. No focal liver abnormality is seen. No gallstones, gallbladder wall thickening, or biliary dilatation. Pancreas:  Evidence of necrotizing pancreatitis is again seen. A 10.2 cm x 3.4 cm fluid collection is seen within the region of the pancreatic body and tail. This is mildly decreased in size when compared to the prior study (measured approximately 11.8 cm x 3.5 cm on the prior study). An additional 2.7 cm x 2.1 cm fluid collection is seen inferior and anterior to the pancreatic head. This measures approximately 3.5 cm x 2.2 cm on the prior study. Spleen: Normal in size without focal abnormality. Adrenals/Urinary Tract: Adrenal glands are unremarkable. Kidneys are normal, without renal calculi, focal lesion, or hydronephrosis. Bladder is unremarkable. Stomach/Bowel: Stomach is within normal limits. The appendix is not visualized. No evidence of bowel wall thickening, distention, or inflammatory changes. Vascular/Lymphatic: Aortic atherosclerosis. No enlarged abdominal or pelvic lymph nodes. Reproductive: Prostate is unremarkable. Other: No abdominal wall hernia or abnormality. No abdominopelvic ascites. Musculoskeletal: No acute or significant osseous findings. IMPRESSION: 1. Evidence of necrotizing pancreatitis involving the pancreatic body and tail. 2. Interval decrease in size of peripancreatic fluid collections, as described above. 3. Fatty liver. 4. Aortic atherosclerosis. Aortic Atherosclerosis (ICD10-I70.0). Electronically Signed  By: Aram Candela M.D.   On: 12/02/2020 01:06   MR 3D Recon At Scanner  Result Date: 12/03/2020 CLINICAL DATA:  60 year old male with history of abdominal pain. Pancreatitis. EXAM: MRI ABDOMEN WITHOUT AND WITH CONTRAST (INCLUDING MRCP) TECHNIQUE: Multiplanar multisequence MR imaging of the abdomen was performed both before and after the administration of intravenous contrast. Heavily T2-weighted images of the biliary and pancreatic ducts were obtained, and three-dimensional MRCP images were rendered by post processing. CONTRAST:  57mL GADAVIST GADOBUTROL 1 MMOL/ML IV SOLN COMPARISON:   No prior abdominal MRI. CT the abdomen and pelvis 12/02/2020. FINDINGS: Lower chest: Areas of increased signal intensity in the dependent portions of the lungs bilaterally, likely to reflect areas of dependent subsegmental atelectasis but poorly evaluated on today's magnetic resonance examination. Hepatobiliary: Diffuse loss of signal intensity throughout the hepatic parenchyma on out of phase dual echo images, indicative of a background of severe hepatic steatosis. No suspicious cystic or solid hepatic lesions. No intra or extrahepatic biliary ductal dilatation noted on MRCP images. Common bile duct measures 4 mm in the porta hepatis. No filling defect in the common bile duct to suggest choledocholithiasis. Gallbladder is normal in appearance. Pancreas: As noted on the recent CT examination, the body and tail of the pancreas are severely atrophied, and the normal pancreatic parenchyma is largely replaced with a complex fluid collection which demonstrates heterogeneous T1 and T2 signal intensity, with no significant internal enhancement on post gadolinium imaging, compatible with widespread areas of pancreatic necrosis and complex pancreatic pseudocyst. This measures approximately 11.2 x 3.8 x 3.6 cm. In the inferior aspect of the pancreatic head anteriorly there is a smaller 2.2 x 1.1 cm collection (axial image 28 of series 16) with similar imaging characteristics. Spleen:  Unremarkable. Adrenals/Urinary Tract: Bilateral kidneys and adrenal glands are normal in appearance. No hydroureteronephrosis in the visualized portions of the abdomen. Stomach/Bowel: Visualized portions are unremarkable. Vascular/Lymphatic: No aneurysm identified in the visualized abdominal vasculature. Splenic vein remains patent at this time. Other: No significant volume of ascites noted in the visualized portions of the peritoneal cavity. Musculoskeletal: No aggressive appearing osseous lesions are noted in the visualized portions of the  skeleton. IMPRESSION: 1. Necrotizing pancreatitis with complex pancreatic pseudocysts in the head, body and tail of the pancreas, as detailed above. 2. Severe hepatic steatosis. Electronically Signed   By: Trudie Reed M.D.   On: 12/03/2020 07:13   MR ABDOMEN MRCP W WO CONTAST  Result Date: 12/03/2020 CLINICAL DATA:  60 year old male with history of abdominal pain. Pancreatitis. EXAM: MRI ABDOMEN WITHOUT AND WITH CONTRAST (INCLUDING MRCP) TECHNIQUE: Multiplanar multisequence MR imaging of the abdomen was performed both before and after the administration of intravenous contrast. Heavily T2-weighted images of the biliary and pancreatic ducts were obtained, and three-dimensional MRCP images were rendered by post processing. CONTRAST:  99mL GADAVIST GADOBUTROL 1 MMOL/ML IV SOLN COMPARISON:  No prior abdominal MRI. CT the abdomen and pelvis 12/02/2020. FINDINGS: Lower chest: Areas of increased signal intensity in the dependent portions of the lungs bilaterally, likely to reflect areas of dependent subsegmental atelectasis but poorly evaluated on today's magnetic resonance examination. Hepatobiliary: Diffuse loss of signal intensity throughout the hepatic parenchyma on out of phase dual echo images, indicative of a background of severe hepatic steatosis. No suspicious cystic or solid hepatic lesions. No intra or extrahepatic biliary ductal dilatation noted on MRCP images. Common bile duct measures 4 mm in the porta hepatis. No filling defect in the common bile duct to suggest choledocholithiasis.  Gallbladder is normal in appearance. Pancreas: As noted on the recent CT examination, the body and tail of the pancreas are severely atrophied, and the normal pancreatic parenchyma is largely replaced with a complex fluid collection which demonstrates heterogeneous T1 and T2 signal intensity, with no significant internal enhancement on post gadolinium imaging, compatible with widespread areas of pancreatic necrosis and  complex pancreatic pseudocyst. This measures approximately 11.2 x 3.8 x 3.6 cm. In the inferior aspect of the pancreatic head anteriorly there is a smaller 2.2 x 1.1 cm collection (axial image 28 of series 16) with similar imaging characteristics. Spleen:  Unremarkable. Adrenals/Urinary Tract: Bilateral kidneys and adrenal glands are normal in appearance. No hydroureteronephrosis in the visualized portions of the abdomen. Stomach/Bowel: Visualized portions are unremarkable. Vascular/Lymphatic: No aneurysm identified in the visualized abdominal vasculature. Splenic vein remains patent at this time. Other: No significant volume of ascites noted in the visualized portions of the peritoneal cavity. Musculoskeletal: No aggressive appearing osseous lesions are noted in the visualized portions of the skeleton. IMPRESSION: 1. Necrotizing pancreatitis with complex pancreatic pseudocysts in the head, body and tail of the pancreas, as detailed above. 2. Severe hepatic steatosis. Electronically Signed   By: Trudie Reed M.D.   On: 12/03/2020 07:13        Scheduled Meds:  apixaban  5 mg Oral BID   atorvastatin  40 mg Oral Daily   dicyclomine  10 mg Oral TID AC   feeding supplement  237 mL Oral TID BM   insulin aspart  0-15 Units Subcutaneous TID WC   lipase/protease/amylase  24,000 Units Oral TID AC   multivitamin with minerals  1 tablet Oral Daily   nortriptyline  30 mg Oral BID   pantoprazole  40 mg Oral Daily   sucralfate  1 g Oral TID WC & HS   Continuous Infusions:  sodium chloride 150 mL/hr at 12/03/20 1347     LOS: 0 days    Time spent: 40 minutes    Ramiro Harvest, MD [facility]   To contact the attending provider between 7A-7P or the covering provider during after hours 7P-7A, please log into the web site [email or web address] and access using universal [place] password for that web site. If you do not have the password, please call the hospital operator.  12/03/2020, 7:23 PM

## 2020-12-03 NOTE — Progress Notes (Signed)
[facility] Progress Note  Bryce Mendoza 60 y.o. 08/24/1960  CC:  Necrotizing Pancreatitis   Subjective: Patient reports diffuse abdominal pain around 3-4 out of 10. Would like an increase in his pain medications. Has some nausea, no vomiting. Reports he has bilious nonbloody vomitus yesterday. Reports drinking 1-3 bottles of red wine per week for many years. Denies NSAID use. Has no previous history of colonoscopy. 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  Previous history of acute pancreatitis 08/2020 (1st occurrence).  ROS : Review of Systems  Cardiovascular:  Negative for chest pain and palpitations.  Gastrointestinal:  Positive for abdominal pain and nausea. Negative for blood in stool, constipation, diarrhea, heartburn, melena and vomiting.     Objective: Vital signs in last 24 hours: Vitals:   12/02/20 2008 12/03/20 0500  BP: 109/78 118/81  Pulse: 68 67  Resp: 18 18  Temp: 97.6 F (36.4 C) (!) 97 F (36.1 C)  SpO2: 97% 96%    Physical Exam:  General:  Alert, cooperative, no distress, appears stated age  Head:  Normocephalic, without obvious abnormality, atraumatic  Eyes:  Anicteric sclera, EOM's intact  Lungs:   Clear to auscultation bilaterally, respirations unlabored  Heart:  Regular rate and rhythm, S1, S2 normal  Abdomen:   Soft, TTP in epigastric area.     Lab Results: Recent Labs    12/01/20 2244 12/02/20 0756  NA 145 142  K 3.6 4.0  CL 110 107  CO2 22 24  GLUCOSE 211* 221*  BUN <5* 6  CREATININE 0.80 0.76  CALCIUM 9.6 8.9   Recent Labs    12/01/20 2244 12/02/20 0756  AST 84* 53*  ALT 56* 41  ALKPHOS 120 93  BILITOT 0.6 0.7  PROT 8.9* 7.0  ALBUMIN 4.5 3.5   Recent Labs    12/01/20 2244 12/02/20 0756  WBC 13.0* 9.8  NEUTROABS 6.7  --   HGB 17.6* 14.7  HCT 52.5* 43.2  MCV 94.8 93.9  PLT 248 191   No results for input(s): LABPROT, INR in the last 72 hours.    Assessment Necrotizing Pancreatitis;  chronic pancreatitis with calcifications seen on imaging and hx of diabetes. - MRCP 10/3: Necrotizing pancreatitis with complex pancreatic pseudocysts in the head, body and tail of the pancreas, as detailed above. Severe hepatic steatosis. - Leukocytosis with WBC 13.0 10/1 - HGB 17.6 - AST 53/ALT 41/ Alk phos 93 - Lipase 29 10/1  Plan: Continue clear liquid diet  Continue supportive care.  Start Meropenem 1g, IV, at 200 mL/hr every 8 hrs  Could possibly be a candidate for necrosectomy, will discuss with Dr. Forest Gleason GI will follow  Garnette Scheuermann PA-C 12/03/2020, 8:33 AM  Contact #  [phone]

## 2020-12-04 DIAGNOSIS — G6181 Chronic inflammatory demyelinating polyneuritis: Secondary | ICD-10-CM | POA: Diagnosis not present

## 2020-12-04 DIAGNOSIS — K8591 Acute pancreatitis with uninfected necrosis, unspecified: Secondary | ICD-10-CM | POA: Diagnosis not present

## 2020-12-04 DIAGNOSIS — K861 Other chronic pancreatitis: Secondary | ICD-10-CM | POA: Diagnosis not present

## 2020-12-04 DIAGNOSIS — E119 Type 2 diabetes mellitus without complications: Secondary | ICD-10-CM | POA: Diagnosis not present

## 2020-12-04 LAB — CBC
HCT: 40.2 % (ref 39.0–52.0)
Hemoglobin: 13.3 g/dL (ref 13.0–17.0)
MCH: 31.6 pg (ref 26.0–34.0)
MCHC: 33.1 g/dL (ref 30.0–36.0)
MCV: 95.5 fL (ref 80.0–100.0)
Platelets: 157 10*3/uL (ref 150–400)
RBC: 4.21 MIL/uL — ABNORMAL LOW (ref 4.22–5.81)
RDW: 15 % (ref 11.5–15.5)
WBC: 6.5 10*3/uL (ref 4.0–10.5)
nRBC: 0 % (ref 0.0–0.2)

## 2020-12-04 LAB — BASIC METABOLIC PANEL
Anion gap: 3 — ABNORMAL LOW (ref 5–15)
BUN: <5 mg/dL — ABNORMAL LOW (ref 6–20)
CO2: 23 mmol/L (ref 22–32)
Calcium: 8.3 mg/dL — ABNORMAL LOW (ref 8.9–10.3)
Chloride: 109 mmol/L (ref 98–111)
Creatinine, Ser: 0.67 mg/dL (ref 0.61–1.24)
GFR, Estimated: >60 mL/min (ref >60)
Glucose, Bld: 153 mg/dL — ABNORMAL HIGH (ref 70–99)
Potassium: 4.3 mmol/L (ref 3.5–5.1)
Sodium: 135 mmol/L (ref 135–145)

## 2020-12-04 LAB — GLUCOSE, CAPILLARY
Glucose-Capillary: 97 mg/dL (ref 70–99)
Glucose-Capillary: 180 mg/dL — ABNORMAL HIGH (ref 70–99)
Glucose-Capillary: 149 mg/dL — ABNORMAL HIGH (ref 70–99)
Glucose-Capillary: 162 mg/dL — ABNORMAL HIGH (ref 70–99)
Glucose-Capillary: 88 mg/dL (ref 70–99)

## 2020-12-04 LAB — MAGNESIUM: Magnesium: 1.9 mg/dL (ref 1.7–2.4)

## 2020-12-04 NOTE — Progress Notes (Signed)
[facility] Progress Note  Sheldon Sem 60 y.o. Sep 21, 1960  CC:  Necrotizing Pancreatitis   Subjective: Patient reports improvement in abdominal pain. States he wanted to try to eat before taking pain medication to see how his pain level was. Reports worsening pain with eating. Well controlled with pain medication. Reports nausea with some dry heaving last night, no vomiting. Endorses some diarrhea as well.  ROS : Review of Systems  Cardiovascular:  Negative for chest pain and palpitations.  Gastrointestinal:  Positive for abdominal pain, diarrhea and nausea. Negative for blood in stool, constipation, heartburn, melena and vomiting.     Objective: Vital signs in last 24 hours: Vitals:   12/03/20 2036 12/04/20 0523  BP: 120/85 119/89  Pulse: 70 71  Resp: 14 14  Temp: 98.2 F (36.8 C) 97.6 F (36.4 C)  SpO2: 100% 99%    Physical Exam:  General:  Alert, cooperative, no distress  Head:  Normocephalic, without obvious abnormality, atraumatic  Eyes:  Anicteric sclera, EOM's intact  Lungs:   Clear to auscultation bilaterally, respirations unlabored  Heart:  Regular rate and rhythm, S1, S2 normal  Abdomen:   Soft, minimal tenderness to epigastric palpation, normal bowel sounds in all quadrants.    Lab Results: Recent Labs    12/03/20 0835 12/04/20 0530  NA 137 135  K 3.4* 4.3  CL 105 109  CO2 24 23  GLUCOSE 156* 153*  BUN <5* <5*  CREATININE 0.65 0.67  CALCIUM 8.1* 8.3*  MG 1.5* 1.9  PHOS 3.1  --    Recent Labs    12/02/20 0756 12/03/20 0835  AST 53* 38  ALT 41 29  ALKPHOS 93 77  BILITOT 0.7 1.0  PROT 7.0 5.7*  ALBUMIN 3.5 2.9*   Recent Labs    12/01/20 2244 12/02/20 0756 12/03/20 0835 12/04/20 0530  WBC 13.0*   < > 5.4 6.5  NEUTROABS 6.7  --  2.4  --   HGB 17.6*   < > 13.8 13.3  HCT 52.5*   < > 41.4 40.2  MCV 94.8   < > 94.7 95.5  PLT 248   < > 146* 157   < > = values in this interval not displayed.   No results for input(s): LABPROT,  INR in the last 72 hours.    Assessment Necrotizing Pancreatitis; chronic pancreatitis with calcifications seen on imaging and hx of diabetes. - MRCP 10/3: Necrotizing pancreatitis with complex pancreatic pseudocysts in the head, body and tail of the pancreas, as detailed above. Severe hepatic steatosis. - No leukocytosis with WBC at 6.5 (decreased from 13.0 10/1) - HGB 13.3 - AST 53/ALT 41/ Alk phos 93 10/3 - Lipase 29 10/1   Plan: Continue conservative management with pain control.   Can continue full liquid diet as tolerated.  Continue antiemetics as needed.  [facility] will follow  Garnette Scheuermann PA-C 12/04/2020, 8:13 AM  Contact #  [phone]

## 2020-12-04 NOTE — Progress Notes (Signed)
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  CT done concerning for necrotizing pancreatitis with pseudocyst.  Patient admitted for acute on chronic pancreatitis flare, pain management.  Patient seen in consultation by GI and general surgery.  MRI MRCP abdomen done with necrotizing pancreatitis with complex pancreatic pseudocyst in the head, body and tail of the pancreas.  Severe hepatic steatosis.   Assessment & Plan:   Principal Problem:   Chronic pancreatitis (HCC) Active Problems:   CIDP (chronic inflammatory demyelinating polyneuropathy) (HCC)   Necrotizing pancreatitis   DM type 2 (diabetes mellitus, type 2) (HCC)   Protein-calorie malnutrition, severe   #1 acute on chronic pancreatitis -Patient presenting nausea, vomiting, abdominal pain greater in the epigastrium, inability to keep anything down. -Patient with multiple recent hospitalizations for similar issues however states get discharged on oxycodone which somewhat helps with the pain but only receives 5 days worth of pain treatment. -Patient stated has been scheduled to follow-up with a pain management specialist however that seen a few months and pain currently uncontrolled. -Tolerating clear liquids. -CT abdomen and pelvis done concerning for necrotizing pancreatitis and pseudocyst. -MRI/MRCP abdomen done with necrotizing pancreatitis with complex pancreatic pseudocyst in the head, body, tail of the  pancreas -Currently afebrile with normal white count. -Patient seen by GI who are recommending diet advancement to full liquids which patient is currently tolerating, continuation of pain management.  Initially IV antibiotics were to be started however discontinued per GI recommendations. -Patient seen in consultation by general surgery who feel no surgical intervention needed at this time and have signed off. -Continue IV fluids, pain management, supportive care, pancreatic enzymes. -Consulted palliative care to help with symptom management. -Hopefully on discharge patient should be able to follow-up with PCP closely who should be able to manage pain since pain medication until his appointment with pain specialist. -Supportive care.  2.  CIDP -Continue Pamelor.  3.  History of DVT/PE -Continue Eliquis.  4.  Diabetes mellitus type 2 -Hemoglobin A1c 6.7 (12/01/2020). -CBG 180 this morning.   -Continue to hold oral hypoglycemic agents.   -SSI.   5.  Hypokalemia/hypomagnesemia -Potassium repleted currently at 4.3.  Magnesium at 1.9.   -Follow.  6.  Severe protein calorie malnutrition -Continue nutritional supplementation.     DVT prophylaxis: Eliquis Code Status: Full Family Communication: Updated patient.  No family at bedside Disposition:   Status is: Inpatient  The patient will require care spanning > 2 midnights and should be moved to inpatient because: Inpatient level of care appropriate due to severity of illness  Dispo: The patient is from: Home              Anticipated d/c is to: Home              Patient currently is not medically stable to d/c.   Difficult to place patient No       Consultants:  Gastroenterology: Dr.Magod 12/02/2020 General surgery: Dr. Luisa Hart 12/02/2020  Procedures: CT abdomen and pelvis 12/02/2020 MRI/MRCP abdomen 12/03/2020   Antimicrobials: None  Subjective: Sitting up in bed.  Tolerating full liquid diet however stated this  morning when he ate his full liquid diet without any pain medication prior to that had some abdominal pain.  States pain medication managing pain much better than on admission.  No chest pain.  No shortness of breath.    Objective: Vitals:   12/03/20 1408 12/03/20 2036 12/04/20 0523 12/04/20 1523  BP: 122/86 120/85 119/89 (!) 152/98  Pulse: 69 70 71 80  Resp: 17 14 14 20   Temp: 97.6 F (36.4 C) 98.2 F (36.8 C) 97.6 F (36.4 C) 98.2 F (36.8 C)  TempSrc: Oral Oral Oral Oral  SpO2: 98% 100% 99% 100%  Weight:   120.8 kg   Height:        Intake/Output Summary (Last 24 hours) at 12/04/2020 1844 Last data filed at 12/04/2020 1602 Gross per 24 hour  Intake 2357.79 ml  Output 1975 ml  Net 382.79 ml    Filed Weights   12/01/20 2135 12/02/20 0820 12/04/20 0523  Weight: 115.7 kg 121.2 kg 120.8 kg    Examination:  General exam: NAD Respiratory system: CTA B.  No wheezes, no crackles, no rhonchi.  Normal respiratory effort.   Cardiovascular system: Regular rate rhythm no murmurs rubs or gallops.  No JVD.  No lower extremity edema. Gastrointestinal system: Abdomen is soft, nondistended, some tenderness to palpation in the epigastrium.  Positive bowel sounds. Central nervous system: Alert and oriented. No focal neurological deficits. Extremities: Symmetric 5 x 5 power. Skin: No rashes, lesions or ulcers Psychiatry: Judgement and insight appear normal. Mood & affect appropriate.     Data Reviewed: I have personally reviewed following labs and imaging studies  CBC: Recent Labs  Lab 12/01/20 2244 12/02/20 0756 12/03/20 0835 12/04/20 0530  WBC 13.0* 9.8 5.4 6.5  NEUTROABS 6.7  --  2.4  --   HGB 17.6* 14.7 13.8 13.3  HCT 52.5* 43.2 41.4 40.2  MCV 94.8 93.9 94.7 95.5  PLT 248 191 146* 157     Basic Metabolic Panel: Recent Labs  Lab 12/01/20 2244 12/02/20 0756 12/03/20 0835 12/04/20 0530  NA 145 142 137 135  K 3.6 4.0 3.4* 4.3  CL 110 107 105 109  CO2 22 24 24 23    GLUCOSE 211* 221* 156* 153*  BUN <5* 6 <5* <5*  CREATININE 0.80 0.76 0.65 0.67  CALCIUM 9.6 8.9 8.1* 8.3*  MG  --   --  1.5* 1.9  PHOS  --   --  3.1  --      GFR: Estimated Creatinine Clearance: 137.1 mL/min (by C-G formula based on SCr of 0.67 mg/dL).  Liver Function Tests: Recent Labs  Lab 12/01/20 2244 12/02/20 0756 12/03/20 0835  AST 84* 53* 38  ALT 56* 41 29  ALKPHOS 120 93 77  BILITOT 0.6 0.7 1.0  PROT 8.9* 7.0 5.7*  ALBUMIN 4.5 3.5 2.9*     CBG: Recent Labs  Lab 12/03/20 1641 12/03/20 2038 12/04/20 0750 12/04/20 1211 12/04/20 1700  GLUCAP 97 173* 180* 149* 162*      Recent Results (from the past 240 hour(s))  Resp Panel by RT-PCR (Flu A&B, Covid) Nasopharyngeal Swab     Status: None   Collection Time: 12/02/20  2:31 AM   Specimen: Nasopharyngeal Swab; Nasopharyngeal(NP) swabs in vial transport medium  Result Value Ref Range Status   SARS Coronavirus 2 by RT PCR NEGATIVE NEGATIVE Final    Comment: (NOTE) SARS-CoV-2 target nucleic acids are NOT DETECTED.  The SARS-CoV-2 RNA is generally detectable in upper respiratory specimens during the acute phase of infection. The lowest concentration of SARS-CoV-2 viral copies this assay can detect is 138 copies/mL. A negative result does not preclude SARS-Cov-2 infection and should not be used as the sole basis for treatment or other patient management decisions. A negative result may occur with  improper specimen collection/handling, submission of specimen other than nasopharyngeal swab, presence of viral mutation(s) within the areas targeted by this assay, and inadequate number of viral copies(<138 copies/mL). A negative result must be combined with clinical observations, patient history, and epidemiological information. The expected result is Negative.  Fact Sheet for Patients:  BloggerCourse.com  Fact Sheet for Healthcare Providers:   SeriousBroker.it  This test is no t yet approved or cleared by the [place] FDA and  has been authorized for detection and/or diagnosis of SARS-CoV-2 by FDA under an Emergency Use Authorization (EUA). This EUA will remain  in effect (meaning this test can be used) for the duration of the COVID-19 declaration under Section 564(b)(1) of the Act, 21 U.S.C.section 360bbb-3(b)(1), unless the authorization is terminated  or revoked sooner.       Influenza A by PCR NEGATIVE NEGATIVE Final   Influenza B by PCR NEGATIVE NEGATIVE Final    Comment: (NOTE) The Xpert Xpress SARS-CoV-2/FLU/RSV plus assay is intended as an aid in the diagnosis of influenza from Nasopharyngeal swab specimens and should not be used as a sole basis for treatment. Nasal washings and aspirates are unacceptable for Xpert Xpress SARS-CoV-2/FLU/RSV testing.  Fact Sheet for Patients: BloggerCourse.com  Fact Sheet for Healthcare Providers: SeriousBroker.it  This test is not yet approved or cleared by the [place] FDA and has been authorized for detection and/or diagnosis of SARS-CoV-2 by FDA under an Emergency Use Authorization (EUA). This EUA will remain in effect (meaning this test can be used) for the duration of the COVID-19 declaration under Section 564(b)(1) of the Act, 21 U.S.C. section 360bbb-3(b)(1), unless the authorization is terminated or revoked.  Performed at [facility], [address] [place], [place] [address]           Radiology Studies: MR 3D Recon At Scanner  Result Date: 12/03/2020 CLINICAL DATA:  60 year old male with history of abdominal pain. Pancreatitis. EXAM: MRI ABDOMEN WITHOUT AND WITH CONTRAST (INCLUDING MRCP) TECHNIQUE: Multiplanar multisequence MR imaging of the abdomen was performed both before and after the administration of intravenous contrast. Heavily T2-weighted  images of the biliary and pancreatic ducts were obtained, and three-dimensional MRCP images were rendered by post processing. CONTRAST:  75mL GADAVIST GADOBUTROL 1 MMOL/ML IV SOLN COMPARISON:  No prior abdominal MRI. CT the abdomen and pelvis 12/02/2020. FINDINGS: Lower chest: Areas of increased signal intensity in the dependent portions of the lungs bilaterally, likely to reflect areas of dependent subsegmental atelectasis but poorly evaluated on today's magnetic resonance examination. Hepatobiliary: Diffuse loss of signal intensity throughout the hepatic parenchyma on out of phase dual echo images, indicative of a background of severe hepatic steatosis. No suspicious cystic or solid hepatic lesions. No intra or extrahepatic biliary ductal dilatation noted on MRCP images. Common bile duct measures 4 mm in the porta hepatis. No filling defect in the common bile duct to suggest choledocholithiasis. Gallbladder is normal in appearance. Pancreas: As noted on the recent CT examination, the body and tail of the pancreas are severely atrophied, and the normal pancreatic parenchyma is largely replaced with a complex fluid collection which demonstrates heterogeneous T1 and T2  signal intensity, with no significant internal enhancement on post gadolinium imaging, compatible with widespread areas of pancreatic necrosis and complex pancreatic pseudocyst. This measures approximately 11.2 x 3.8 x 3.6 cm. In the inferior aspect of the pancreatic head anteriorly there is a smaller 2.2 x 1.1 cm collection (axial image 28 of series 16) with similar imaging characteristics. Spleen:  Unremarkable. Adrenals/Urinary Tract: Bilateral kidneys and adrenal glands are normal in appearance. No hydroureteronephrosis in the visualized portions of the abdomen. Stomach/Bowel: Visualized portions are unremarkable. Vascular/Lymphatic: No aneurysm identified in the visualized abdominal vasculature. Splenic vein remains patent at this time. Other: No  significant volume of ascites noted in the visualized portions of the peritoneal cavity. Musculoskeletal: No aggressive appearing osseous lesions are noted in the visualized portions of the skeleton. IMPRESSION: 1. Necrotizing pancreatitis with complex pancreatic pseudocysts in the head, body and tail of the pancreas, as detailed above. 2. Severe hepatic steatosis. Electronically Signed   By: Trudie Reed M.D.   On: 12/03/2020 07:13   MR ABDOMEN MRCP W WO CONTAST  Result Date: 12/03/2020 CLINICAL DATA:  60 year old male with history of abdominal pain. Pancreatitis. EXAM: MRI ABDOMEN WITHOUT AND WITH CONTRAST (INCLUDING MRCP) TECHNIQUE: Multiplanar multisequence MR imaging of the abdomen was performed both before and after the administration of intravenous contrast. Heavily T2-weighted images of the biliary and pancreatic ducts were obtained, and three-dimensional MRCP images were rendered by post processing. CONTRAST:  10mL GADAVIST GADOBUTROL 1 MMOL/ML IV SOLN COMPARISON:  No prior abdominal MRI. CT the abdomen and pelvis 12/02/2020. FINDINGS: Lower chest: Areas of increased signal intensity in the dependent portions of the lungs bilaterally, likely to reflect areas of dependent subsegmental atelectasis but poorly evaluated on today's magnetic resonance examination. Hepatobiliary: Diffuse loss of signal intensity throughout the hepatic parenchyma on out of phase dual echo images, indicative of a background of severe hepatic steatosis. No suspicious cystic or solid hepatic lesions. No intra or extrahepatic biliary ductal dilatation noted on MRCP images. Common bile duct measures 4 mm in the porta hepatis. No filling defect in the common bile duct to suggest choledocholithiasis. Gallbladder is normal in appearance. Pancreas: As noted on the recent CT examination, the body and tail of the pancreas are severely atrophied, and the normal pancreatic parenchyma is largely replaced with a complex fluid collection  which demonstrates heterogeneous T1 and T2 signal intensity, with no significant internal enhancement on post gadolinium imaging, compatible with widespread areas of pancreatic necrosis and complex pancreatic pseudocyst. This measures approximately 11.2 x 3.8 x 3.6 cm. In the inferior aspect of the pancreatic head anteriorly there is a smaller 2.2 x 1.1 cm collection (axial image 28 of series 16) with similar imaging characteristics. Spleen:  Unremarkable. Adrenals/Urinary Tract: Bilateral kidneys and adrenal glands are normal in appearance. No hydroureteronephrosis in the visualized portions of the abdomen. Stomach/Bowel: Visualized portions are unremarkable. Vascular/Lymphatic: No aneurysm identified in the visualized abdominal vasculature. Splenic vein remains patent at this time. Other: No significant volume of ascites noted in the visualized portions of the peritoneal cavity. Musculoskeletal: No aggressive appearing osseous lesions are noted in the visualized portions of the skeleton. IMPRESSION: 1. Necrotizing pancreatitis with complex pancreatic pseudocysts in the head, body and tail of the pancreas, as detailed above. 2. Severe hepatic steatosis. Electronically Signed   By: Trudie Reed M.D.   On: 12/03/2020 07:13        Scheduled Meds:  apixaban  5 mg Oral BID   atorvastatin  40 mg Oral Daily  dicyclomine  10 mg Oral TID AC   feeding supplement  237 mL Oral TID BM   insulin aspart  0-15 Units Subcutaneous TID WC   lipase/protease/amylase  24,000 Units Oral TID AC   multivitamin with minerals  1 tablet Oral Daily   nortriptyline  30 mg Oral BID   pantoprazole  40 mg Oral Daily   sucralfate  1 g Oral TID WC & HS   Continuous Infusions:  sodium chloride 125 mL/hr at 12/04/20 1725     LOS: 1 day    Time spent: 40 minutes    Ramiro Harvest, MD [facility]   To contact the attending provider between 7A-7P or the covering provider during after hours 7P-7A, please log  into the web site [email or web address] and access using universal [place] password for that web site. If you do not have the password, please call the hospital operator.  12/04/2020, 6:44 PM

## 2020-12-05 ENCOUNTER — Inpatient Hospital Stay (HOSPITAL_COMMUNITY): Payer: Medicaid Other

## 2020-12-05 DIAGNOSIS — G6181 Chronic inflammatory demyelinating polyneuritis: Secondary | ICD-10-CM | POA: Diagnosis not present

## 2020-12-05 DIAGNOSIS — E43 Unspecified severe protein-calorie malnutrition: Secondary | ICD-10-CM | POA: Diagnosis not present

## 2020-12-05 DIAGNOSIS — K8591 Acute pancreatitis with uninfected necrosis, unspecified: Secondary | ICD-10-CM | POA: Diagnosis not present

## 2020-12-05 LAB — BASIC METABOLIC PANEL
Anion gap: 9 (ref 5–15)
BUN: <5 mg/dL — ABNORMAL LOW (ref 6–20)
CO2: 21 mmol/L — ABNORMAL LOW (ref 22–32)
Calcium: 8.3 mg/dL — ABNORMAL LOW (ref 8.9–10.3)
Chloride: 105 mmol/L (ref 98–111)
Creatinine, Ser: 0.61 mg/dL (ref 0.61–1.24)
GFR, Estimated: >60 mL/min (ref >60)
Glucose, Bld: 115 mg/dL — ABNORMAL HIGH (ref 70–99)
Potassium: 4.1 mmol/L (ref 3.5–5.1)
Sodium: 135 mmol/L (ref 135–145)

## 2020-12-05 LAB — CBC WITH DIFFERENTIAL/PLATELET
Abs Immature Granulocytes: 0.02 10*3/uL (ref 0.00–0.07)
Basophils Absolute: 0 10*3/uL (ref 0.0–0.1)
Basophils Relative: 0 %
Eosinophils Absolute: 0.1 10*3/uL (ref 0.0–0.5)
Eosinophils Relative: 1 %
HCT: 43.7 % (ref 39.0–52.0)
Hemoglobin: 14.5 g/dL (ref 13.0–17.0)
Immature Granulocytes: 0 %
Lymphocytes Relative: 33 %
Lymphs Abs: 2.6 10*3/uL (ref 0.7–4.0)
MCH: 31.6 pg (ref 26.0–34.0)
MCHC: 33.2 g/dL (ref 30.0–36.0)
MCV: 95.2 fL (ref 80.0–100.0)
Monocytes Absolute: 0.5 10*3/uL (ref 0.1–1.0)
Monocytes Relative: 7 %
Neutro Abs: 4.6 10*3/uL (ref 1.7–7.7)
Neutrophils Relative %: 59 %
Platelets: 145 10*3/uL — ABNORMAL LOW (ref 150–400)
RBC: 4.59 MIL/uL (ref 4.22–5.81)
RDW: 15.1 % (ref 11.5–15.5)
WBC: 7.8 10*3/uL (ref 4.0–10.5)
nRBC: 0 % (ref 0.0–0.2)

## 2020-12-05 LAB — MAGNESIUM: Magnesium: 1.6 mg/dL — ABNORMAL LOW (ref 1.7–2.4)

## 2020-12-05 LAB — GLUCOSE, CAPILLARY
Glucose-Capillary: 115 mg/dL — ABNORMAL HIGH (ref 70–99)
Glucose-Capillary: 175 mg/dL — ABNORMAL HIGH (ref 70–99)

## 2020-12-05 MED ORDER — MAGNESIUM SULFATE 2 GM/50ML IV SOLN
2.0000 g | Freq: Once | INTRAVENOUS | Status: AC
  Administered 2020-12-05: 2 g via INTRAVENOUS
  Filled 2020-12-05: qty 50

## 2020-12-05 MED ORDER — SUCRALFATE 1 G PO TABS
1.0000 g | ORAL_TABLET | Freq: Two times a day (BID) | ORAL | Status: DC
  Administered 2020-12-05 – 2020-12-08 (×6): 1 g via ORAL
  Filled 2020-12-05 (×6): qty 1

## 2020-12-05 NOTE — Progress Notes (Signed)
[facility]astroenterology Progress Note  Bryce Mendoza 60 y.o. 20-Mar-1960  CC:  Necrotizing Pancreatitis   Subjective: Patient reports having a bad night. States he vomiting all of the food up that he ate yesterday. Reports he could tell it was the food all the way from breakfast. Still having epigastric pain, would like an increase in pain management as his current regimen is not controlling it well. States he can sip a few sips of water and feel nauseous. Last BM was 2 days ago and it was a loose stool.  ROS : Review of Systems  Cardiovascular:  Negative for chest pain and palpitations.  Gastrointestinal:  Positive for abdominal pain, diarrhea, nausea and vomiting. Negative for blood in stool, constipation, heartburn and melena.     Objective: Vital signs in last 24 hours: Vitals:   12/04/20 2100 12/05/20 0516  BP: 120/85 (!) 149/92  Pulse: 78 78  Resp: 12 14  Temp: 97.8 F (36.6 C) (!) 97.4 F (36.3 C)  SpO2: 99% 98%    Physical Exam:  General:  Alert, cooperative, no distress  Head:  Normocephalic, without obvious abnormality, atraumatic  Eyes:  Anicteric sclera, EOM's intact  Lungs:   Clear to auscultation bilaterally, respirations unlabored  Heart:  Regular rate and rhythm, S1, S2 normal  Abdomen:   Soft, TTP epigastric area, bowel sounds active all four quadrants,  no masses,     Lab Results: Recent Labs    12/03/20 0835 12/04/20 0530 12/05/20 0535  NA 137 135 135  K 3.4* 4.3 4.1  CL 105 109 105  CO2 24 23 21*  GLUCOSE 156* 153* 115*  BUN <5* <5* <5*  CREATININE 0.65 0.67 0.61  CALCIUM 8.1* 8.3* 8.3*  MG 1.5* 1.9 1.6*  PHOS 3.1  --   --    Recent Labs    12/03/20 0835  AST 38  ALT 29  ALKPHOS 77  BILITOT 1.0  PROT 5.7*  ALBUMIN 2.9*   Recent Labs    12/03/20 0835 12/04/20 0530 12/05/20 0535  WBC 5.4 6.5 7.8  NEUTROABS 2.4  --  4.6  HGB 13.8 13.3 14.5  HCT 41.4 40.2 43.7  MCV 94.7 95.5 95.2  PLT 146* 157 145*   No results for input(s):  LABPROT, INR in the last 72 hours.    Assessment Necrotizing Pancreatitis; chronic pancreatitis with calcifications seen on imaging and hx of diabetes. - MRCP 10/3: Necrotizing pancreatitis with complex pancreatic pseudocysts in the head, body and tail of the pancreas, as detailed above. Severe hepatic steatosis. - No leukocytosis  - HGB 13.3 - AST 53/ALT 41/ Alk phos 93 10/3 - Lipase 29 10/1   Plan: Start clear liquid diet as tolerated.  Continue supportive care including pain management and antiemetics as needed.  Will get Abdominal xray to evaluate for possible ileus with nausea and vomiting.  Because of concern for necrotizing pancreatitis will need repeat imaging as an outpatient in 1 to 2 weeks.  [facility] will follow  Garnette Scheuermann PA-C 12/05/2020, 8:32 AM  Contact #  [phone]

## 2020-12-05 NOTE — Progress Notes (Signed)
PROGRESS NOTE  Bryce Mendoza QZE:[id number] DOB: 1960-12-27 DOA: 12/01/2020 PCP: Claiborne Rigg, NP  HPI/Recap of past 58 hours: 60 year old gentleman with past medical history of prior DVT/PE on Eliquis, former alcoholic with history of necrotizing pancreatitis secondary chronic pain and frequent admissions for pain from chronic pancreatitis admitted on 10/1 for nausea/vomiting and acute on chronic pancreatitis again.  MRI/MRCP noted necrotizing pancreatitis with complex pancreatic pseudocyst.  Patient made n.p.o. with GI following.  Patient overall improved and diet slowly advanced.  On evening of 10/4, patient started developing more nausea and vomiting.  Abdominal x-ray done morning of 10/5 noted ileus.  Still complains of some nausea when trying to take p.o.  Feels different than his pancreatitis.  Assessment/Plan: Principal Problem: Acute on chronic necrotizing pancreatitis (HCC) with complex pseudocyst: Stabilized.  Appreciate GI assistance.  Now complicated with no ileus.  Encouraging ambulation, chewing gum and hydration.  Recheck nominal x-ray in the morning. Active Problems:   CIDP (chronic inflammatory demyelinating polyneuropathy) (HCC): Continue Pamelor.  History of DVT/PE: Continue Eliquis    DM type 2 (diabetes mellitus, type 2) (HCC): Holding oral agents.  A1c at 6.7.    Protein-calorie malnutrition, severe: Nutrition Status: Nutrition Problem: Severe Malnutrition Etiology: acute illness (Pancreatitis) Signs/Symptoms: energy intake < or equal to 50% for > or equal to 5 days, mild muscle depletion, percent weight loss Percent weight loss: 6 % Interventions: Ensure Enlive (each supplement provides 350kcal and 20 grams of protein), MVI  Electrolyte abnormalities: Replacing potassium/magnesium as needed  Morbid obesity: Patient meets criteria BMI greater than 35+ comorbidities of diabetes mellitus, chronic pancreatitis   Code Status: Full code  Family  Communication: Declined for me to call family  Disposition Plan: Anticipate discharge once ileus resolved.  Recheck abdominal x-ray in the morning   Consultants: Gastroenterology General surgery  Procedures: None  Antimicrobials: None  DVT prophylaxis: Eliquis  Level of care: Med-Surg   Objective: Vitals:   12/05/20 0516 12/05/20 1410  BP: (!) 149/92 133/82  Pulse: 78 72  Resp: 14 16  Temp: (!) 97.4 F (36.3 C) 98 F (36.7 C)  SpO2: 98% 100%    Intake/Output Summary (Last 24 hours) at 12/05/2020 1443 Last data filed at 12/05/2020 1400 Gross per 24 hour  Intake 2658.44 ml  Output 2700 ml  Net -41.56 ml   Filed Weights   12/02/20 0820 12/04/20 0523 12/05/20 0516  Weight: 121.2 kg 120.8 kg 126.2 kg   Body mass index is 36.71 kg/m.  Exam:  General: Alert and oriented x3, fatigued HEENT: Normocephalic, atraumatic, mucous membranes are slightly dry Cardiovascular: Regular rate and rhythm, S1-S2 Respiratory: Clear to auscultation bilaterally Abdomen: Soft, nontender, mildly distended, few bowel sounds Musculoskeletal: No clubbing or cyanosis, trace pitting edema Skin: No skin breaks, tears or lesions Psychiatry: Appropriate, no evidence of psychoses Neurology: No focal deficits   Data Reviewed: CBC: Recent Labs  Lab 12/01/20 2244 12/02/20 0756 12/03/20 0835 12/04/20 0530 12/05/20 0535  WBC 13.0* 9.8 5.4 6.5 7.8  NEUTROABS 6.7  --  2.4  --  4.6  HGB 17.6* 14.7 13.8 13.3 14.5  HCT 52.5* 43.2 41.4 40.2 43.7  MCV 94.8 93.9 94.7 95.5 95.2  PLT 248 191 146* 157 145*   Basic Metabolic Panel: Recent Labs  Lab 12/01/20 2244 12/02/20 0756 12/03/20 0835 12/04/20 0530 12/05/20 0535  NA 145 142 137 135 135  K 3.6 4.0 3.4* 4.3 4.1  CL 110 107 105 109 105  CO2 22 24 24 23  21*  GLUCOSE 211* 221* 156* 153* 115*  BUN <5* 6 <5* <5* <5*  CREATININE 0.80 0.76 0.65 0.67 0.61  CALCIUM 9.6 8.9 8.1* 8.3* 8.3*  MG  --   --  1.5* 1.9 1.6*  PHOS  --   --  3.1   --   --    GFR: Estimated Creatinine Clearance: 140.1 mL/min (by C-G formula based on SCr of 0.61 mg/dL). Liver Function Tests: Recent Labs  Lab 12/01/20 2244 12/02/20 0756 12/03/20 0835  AST 84* 53* 38  ALT 56* 41 29  ALKPHOS 120 93 77  BILITOT 0.6 0.7 1.0  PROT 8.9* 7.0 5.7*  ALBUMIN 4.5 3.5 2.9*   Recent Labs  Lab 12/01/20 2244 12/03/20 0835  LIPASE 28 25   No results for input(s): AMMONIA in the last 168 hours. Coagulation Profile: No results for input(s): INR, PROTIME in the last 168 hours. Cardiac Enzymes: No results for input(s): CKTOTAL, CKMB, CKMBINDEX, TROPONINI in the last 168 hours. BNP (last 3 results) No results for input(s): PROBNP in the last 8760 hours. HbA1C: No results for input(s): HGBA1C in the last 72 hours. CBG: Recent Labs  Lab 12/04/20 1211 12/04/20 1700 12/04/20 2100 12/05/20 0740 12/05/20 1147  GLUCAP 149* 162* 88 115* 175*   Lipid Profile: No results for input(s): CHOL, HDL, LDLCALC, TRIG, CHOLHDL, LDLDIRECT in the last 72 hours. Thyroid Function Tests: No results for input(s): TSH, T4TOTAL, FREET4, T3FREE, THYROIDAB in the last 72 hours. Anemia Panel: No results for input(s): VITAMINB12, FOLATE, FERRITIN, TIBC, IRON, RETICCTPCT in the last 72 hours. Urine analysis:    Component Value Date/Time   COLORURINE AMBER (A) 12/01/2020 2335   APPEARANCEUR HAZY (A) 12/01/2020 2335   LABSPEC 1.019 12/01/2020 2335   PHURINE 5.0 12/01/2020 2335   GLUCOSEU 50 (A) 12/01/2020 2335   HGBUR NEGATIVE 12/01/2020 2335   BILIRUBINUR NEGATIVE 12/01/2020 2335   KETONESUR 5 (A) 12/01/2020 2335   PROTEINUR 100 (A) 12/01/2020 2335   NITRITE NEGATIVE 12/01/2020 2335   LEUKOCYTESUR NEGATIVE 12/01/2020 2335   Sepsis Labs: @LABRCNTIP (procalcitonin:4,lacticidven:4)  ) Recent Results (from the past 240 hour(s))  Resp Panel by RT-PCR (Flu A&B, Covid) Nasopharyngeal Swab     Status: None   Collection Time: 12/02/20  2:31 AM   Specimen: Nasopharyngeal  Swab; Nasopharyngeal(NP) swabs in vial transport medium  Result Value Ref Range Status   SARS Coronavirus 2 by RT PCR NEGATIVE NEGATIVE Final    Comment: (NOTE) SARS-CoV-2 target nucleic acids are NOT DETECTED.  The SARS-CoV-2 RNA is generally detectable in upper respiratory specimens during the acute phase of infection. The lowest concentration of SARS-CoV-2 viral copies this assay can detect is 138 copies/mL. A negative result does not preclude SARS-Cov-2 infection and should not be used as the sole basis for treatment or other patient management decisions. A negative result may occur with  improper specimen collection/handling, submission of specimen other than nasopharyngeal swab, presence of viral mutation(s) within the areas targeted by this assay, and inadequate number of viral copies(<138 copies/mL). A negative result must be combined with clinical observations, patient history, and epidemiological information. The expected result is Negative.  Fact Sheet for Patients:  02/01/21  Fact Sheet for Healthcare Providers:  BloggerCourse.com  This test is no t yet approved or cleared by the [place] FDA and  has been authorized for detection and/or diagnosis of SARS-CoV-2 by FDA under an Emergency Use Authorization (EUA). This EUA will remain  in effect (meaning this test can be used) for the duration  of the COVID-19 declaration under Section 564(b)(1) of the Act, 21 U.S.C.section 360bbb-3(b)(1), unless the authorization is terminated  or revoked sooner.       Influenza A by PCR NEGATIVE NEGATIVE Final   Influenza B by PCR NEGATIVE NEGATIVE Final    Comment: (NOTE) The Xpert Xpress SARS-CoV-2/FLU/RSV plus assay is intended as an aid in the diagnosis of influenza from Nasopharyngeal swab specimens and should not be used as a sole basis for treatment. Nasal washings and aspirates are unacceptable for Xpert Xpress  SARS-CoV-2/FLU/RSV testing.  Fact Sheet for Patients: BloggerCourse.com  Fact Sheet for Healthcare Providers: SeriousBroker.it  This test is not yet approved or cleared by the [place] FDA and has been authorized for detection and/or diagnosis of SARS-CoV-2 by FDA under an Emergency Use Authorization (EUA). This EUA will remain in effect (meaning this test can be used) for the duration of the COVID-19 declaration under Section 564(b)(1) of the Act, 21 U.S.C. section 360bbb-3(b)(1), unless the authorization is terminated or revoked.  Performed at [facility], [address] [place], [place] [address]       Studies: DG Abd 1 View  Result Date: 12/05/2020 CLINICAL DATA:  History of necrotizing pancreatitis and chronic pain. EXAM: ABDOMEN - 1 VIEW COMPARISON:  CT AP 12/02/2020 FINDINGS: There is increase caliber of several central loops of small bowel which measure up to 4.3 cm. Gas is noted within the colon up to the level of the rectum. No radio-opaque calculi or other significant radiographic abnormality are seen. IMPRESSION: Increase caliber of several central loops of small bowel compatible with ileus or partial obstruction. Electronically Signed   By: Signa Kell M.D.   On: 12/05/2020 11:28    Scheduled Meds:  apixaban  5 mg Oral BID   atorvastatin  40 mg Oral Daily   dicyclomine  10 mg Oral TID AC   feeding supplement  237 mL Oral TID BM   insulin aspart  0-15 Units Subcutaneous TID WC   lipase/protease/amylase  24,000 Units Oral TID AC   multivitamin with minerals  1 tablet Oral Daily   nortriptyline  30 mg Oral BID   pantoprazole  40 mg Oral Daily   sucralfate  1 g Oral BID    Continuous Infusions:  sodium chloride 125 mL/hr at 12/05/20 1214     LOS: 2 days     Hollice Espy, MD [facility]   12/05/2020, 2:43 PM

## 2020-12-06 ENCOUNTER — Inpatient Hospital Stay (HOSPITAL_COMMUNITY): Payer: Medicaid Other

## 2020-12-06 DIAGNOSIS — K861 Other chronic pancreatitis: Secondary | ICD-10-CM | POA: Diagnosis not present

## 2020-12-06 LAB — GLUCOSE, CAPILLARY
Glucose-Capillary: 89 mg/dL (ref 70–99)
Glucose-Capillary: 147 mg/dL — ABNORMAL HIGH (ref 70–99)
Glucose-Capillary: 104 mg/dL — ABNORMAL HIGH (ref 70–99)
Glucose-Capillary: 115 mg/dL — ABNORMAL HIGH (ref 70–99)
Glucose-Capillary: 127 mg/dL — ABNORMAL HIGH (ref 70–99)

## 2020-12-06 NOTE — Progress Notes (Signed)
PROGRESS NOTE    Bryce Mendoza  EXN:[id number]  DOB: 1960-09-27  DOA: 12/01/2020 PCP: Claiborne Rigg, NP Outpatient Specialists:   Hospital course:  60 year old man recovering alcoholic with history of DVT with multiple episodes of necrotizing pancreatitis with pseudocyst with multiple admissions since July was readmitted 12/01/2020 with abdominal pain.  CT again revealed concern for necrotizing pancreatitis with pseudocyst.  Patient was admitted for flare and pain management.  MRCP shows necrotizing pancreatitis with complex pancreatic pseudocyst in the head body and tail of the pancreas.  Hospital course has been complicated by development of ileus seen on abdominal x-ray 10 5.  He has been treated with bowel rest and conservative management.   Subjective:  Patient states he feels okay.  Notes pain is well controlled.  No vomiting since yesterday.  Notes he is able to drink some juice as long as he is pretreated with Zofran.  Notes he did pass some flatus this morning.   Objective: Vitals:   12/05/20 2032 12/06/20 0450 12/06/20 0500 12/06/20 1443  BP: 124/87 125/80  129/86  Pulse: 72 66  68  Resp: 14 14  18   Temp: 98 F (36.7 C) 97.7 F (36.5 C)  (!) 97.5 F (36.4 C)  TempSrc: Oral Oral  Oral  SpO2: 94% 96%  98%  Weight:   126.9 kg   Height:        Intake/Output Summary (Last 24 hours) at 12/06/2020 1727 Last data filed at 12/06/2020 1710 Gross per 24 hour  Intake 1080 ml  Output 1650 ml  Net -570 ml   Filed Weights   12/04/20 0523 12/05/20 0516 12/06/20 0500  Weight: 120.8 kg 126.2 kg 126.9 kg     Exam:  General: Pale chronically ill-appearing man lying in bed in no acute distress. Eyes: sclera anicteric, conjuctiva mild injection bilaterally CVS: S1-S2, regular  Respiratory:  decreased air entry bilaterally secondary to decreased inspiratory effort, rales at bases  GI: NABS, soft, NT  LE: No edema.  Neuro: A/O x 3, Moving all extremities equally with  normal strength, CN 3-12 intact, grossly nonfocal.    Assessment & Plan:   Ileus Appears to be improving with bowel rest and conservative management Patient had flatus this morning and is tolerating liquids Will repeat abdominal films in the morning although films showed an normal bowel gas pattern.  Necrotizing pancreatitis recurrent Seems to be responding to conservative management Continue pain management, pancrelipase Followed by 02/05/21 GI  CIDP Patient is apparently on nortriptyline for this  History of DVT PE Continue Eliquis  DM2 SSI, hold home medications as long as patient is n.p.o.   DVT prophylaxis: Eliquis Code Status: Full Family Communication: None Disposition Plan:   Patient is from: Home  Anticipated Discharge Location: Home  Barriers to Discharge: Necrotizing pancreatitis  Is patient medically stable for Discharge: No   Scheduled Meds:  apixaban  5 mg Oral BID   atorvastatin  40 mg Oral Daily   dicyclomine  10 mg Oral TID AC   feeding supplement  237 mL Oral TID BM   insulin aspart  0-15 Units Subcutaneous TID WC   lipase/protease/amylase  24,000 Units Oral TID AC   multivitamin with minerals  1 tablet Oral Daily   nortriptyline  30 mg Oral BID   pantoprazole  40 mg Oral Daily   sucralfate  1 g Oral BID   Continuous Infusions:  sodium chloride 125 mL/hr at 12/06/20 1110    Data Reviewed:  Basic Metabolic  Panel: Recent Labs  Lab 12/01/20 2244 12/02/20 0756 12/03/20 0835 12/04/20 0530 12/05/20 0535  NA 145 142 137 135 135  K 3.6 4.0 3.4* 4.3 4.1  CL 110 107 105 109 105  CO2 22 24 24 23  21*  GLUCOSE 211* 221* 156* 153* 115*  BUN <5* 6 <5* <5* <5*  CREATININE 0.80 0.76 0.65 0.67 0.61  CALCIUM 9.6 8.9 8.1* 8.3* 8.3*  MG  --   --  1.5* 1.9 1.6*  PHOS  --   --  3.1  --   --     CBC: Recent Labs  Lab 12/01/20 2244 12/02/20 0756 12/03/20 0835 12/04/20 0530 12/05/20 0535  WBC 13.0* 9.8 5.4 6.5 7.8  NEUTROABS 6.7  --  2.4  --  4.6   HGB 17.6* 14.7 13.8 13.3 14.5  HCT 52.5* 43.2 41.4 40.2 43.7  MCV 94.8 93.9 94.7 95.5 95.2  PLT 248 191 146* 157 145*    Studies: DG Abd 1 View  Result Date: 12/05/2020 CLINICAL DATA:  History of necrotizing pancreatitis and chronic pain. EXAM: ABDOMEN - 1 VIEW COMPARISON:  CT AP 12/02/2020 FINDINGS: There is increase caliber of several central loops of small bowel which measure up to 4.3 cm. Gas is noted within the colon up to the level of the rectum. No radio-opaque calculi or other significant radiographic abnormality are seen. IMPRESSION: Increase caliber of several central loops of small bowel compatible with ileus or partial obstruction. Electronically Signed   By: 02/01/2021 M.D.   On: 12/05/2020 11:28   DG Abd Portable 1V  Result Date: 12/06/2020 CLINICAL DATA:  Ileus. EXAM: PORTABLE ABDOMEN - 1 VIEW COMPARISON:  December 05, 2020. FINDINGS: The bowel gas pattern is normal. No radio-opaque calculi or other significant radiographic abnormality are seen. IMPRESSION: Negative. Electronically Signed   By: December 07, 2020 M.D.   On: 12/06/2020 09:24    Principal Problem:   Chronic pancreatitis (HCC) Active Problems:   CIDP (chronic inflammatory demyelinating polyneuropathy) (HCC)   Necrotizing pancreatitis   DM type 2 (diabetes mellitus, type 2) (HCC)   Protein-calorie malnutrition, severe   Morbid obesity (HCC)     Nicolaas Savo Tublu Sharod Petsch, [facility]  If 7PM-7AM, please contact night-coverage [email or web address]   LOS: 3 days

## 2020-12-06 NOTE — Progress Notes (Signed)
[facility] Progress Note  Bryce Mendoza 60 y.o. 11/23/1960  CC:  Pancreatitis   Subjective: Patient reports doing better today. Pain is well controlled. Minimal nausea and no vomiting. Is passing gas regularly. Last BM was 2 days ago.  ROS : Review of Systems  Cardiovascular:  Negative for chest pain and palpitations.  Gastrointestinal:  Positive for abdominal pain and nausea. Negative for blood in stool, constipation, diarrhea, heartburn, melena and vomiting.     Objective: Vital signs in last 24 hours: Vitals:   12/05/20 2032 12/06/20 0450  BP: 124/87 125/80  Pulse: 72 66  Resp: 14 14  Temp: 98 F (36.7 C) 97.7 F (36.5 C)  SpO2: 94% 96%    Physical Exam:  General:  Alert, cooperative, no distress, appears stated age  Head:  Normocephalic, without obvious abnormality, atraumatic  Eyes:  Anicteric sclera, EOM's intact  Lungs:   Clear to auscultation bilaterally, respirations unlabored  Heart:  Regular rate and rhythm, S1, S2 normal  Abdomen:   Soft, non-tender, bowel sounds active all four quadrants,  no masses,     Lab Results: Recent Labs    12/04/20 0530 12/05/20 0535  NA 135 135  K 4.3 4.1  CL 109 105  CO2 23 21*  GLUCOSE 153* 115*  BUN <5* <5*  CREATININE 0.67 0.61  CALCIUM 8.3* 8.3*  MG 1.9 1.6*   No results for input(s): AST, ALT, ALKPHOS, BILITOT, PROT, ALBUMIN in the last 72 hours. Recent Labs    12/04/20 0530 12/05/20 0535  WBC 6.5 7.8  NEUTROABS  --  4.6  HGB 13.3 14.5  HCT 40.2 43.7  MCV 95.5 95.2  PLT 157 145*   No results for input(s): LABPROT, INR in the last 72 hours.    Assessment Necrotizing Pancreatitis; chronic pancreatitis with calcifications seen on imaging and hx of diabetes. - MRCP 10/3: Necrotizing pancreatitis with complex pancreatic pseudocysts in the head, body and tail of the pancreas, as detailed above. Severe hepatic steatosis. - No leukocytosis  - HGB 13.3 - AST 53/ALT 41/ Alk phos 93 10/3 - Lipase  29 10/1  Ileus - Abdominal xray 10/5: Increase caliber of several central loops of small bowel compatible with ileus or partial obstruction. - Abdominal xray today: The bowel gas pattern is normal. No radio-opaque calculi or other significant radiographic abnormality are seen.    Plan: Appears ileus has resolved on abdominal xray today. Improved clinically as well. Continue to encourage mobilization.  Continue to maintain magnesium above 2 and potassium at 4-4.5.   Patient is on Dilaudid 1 mg IV as needed every 4 hours for pain and oxycodone 10 mg every 4 hours for pain,  recommend alternating between the 2 pain medications every 2 hours for better pain control. Continue pancreatic enzymes  Continue sucralfate BID  Continue pantoprazole QD  [facility] will follow  Bayley M McMichael PA-C 12/06/2020, 12:44 PM  Contact #  [phone]  

## 2020-12-07 DIAGNOSIS — K861 Other chronic pancreatitis: Secondary | ICD-10-CM | POA: Diagnosis not present

## 2020-12-07 DIAGNOSIS — G6181 Chronic inflammatory demyelinating polyneuritis: Secondary | ICD-10-CM | POA: Diagnosis not present

## 2020-12-07 DIAGNOSIS — K8591 Acute pancreatitis with uninfected necrosis, unspecified: Secondary | ICD-10-CM | POA: Diagnosis not present

## 2020-12-07 LAB — GLUCOSE, CAPILLARY
Glucose-Capillary: 168 mg/dL — ABNORMAL HIGH (ref 70–99)
Glucose-Capillary: 139 mg/dL — ABNORMAL HIGH (ref 70–99)
Glucose-Capillary: 115 mg/dL — ABNORMAL HIGH (ref 70–99)
Glucose-Capillary: 152 mg/dL — ABNORMAL HIGH (ref 70–99)

## 2020-12-07 LAB — BASIC METABOLIC PANEL
Anion gap: 5 (ref 5–15)
BUN: <5 mg/dL — ABNORMAL LOW (ref 6–20)
CO2: 26 mmol/L (ref 22–32)
Calcium: 8.7 mg/dL — ABNORMAL LOW (ref 8.9–10.3)
Chloride: 108 mmol/L (ref 98–111)
Creatinine, Ser: 0.7 mg/dL (ref 0.61–1.24)
GFR, Estimated: >60 mL/min (ref >60)
Glucose, Bld: 114 mg/dL — ABNORMAL HIGH (ref 70–99)
Potassium: 3.5 mmol/L (ref 3.5–5.1)
Sodium: 139 mmol/L (ref 135–145)

## 2020-12-07 LAB — LIPASE, BLOOD: Lipase: 24 U/L (ref 11–51)

## 2020-12-07 MED ORDER — DOCUSATE SODIUM 100 MG PO CAPS
100.0000 mg | ORAL_CAPSULE | Freq: Two times a day (BID) | ORAL | Status: DC
  Administered 2020-12-07 – 2020-12-14 (×14): 100 mg via ORAL
  Filled 2020-12-07 (×14): qty 1

## 2020-12-07 MED ORDER — SIMETHICONE 80 MG PO CHEW
80.0000 mg | CHEWABLE_TABLET | Freq: Four times a day (QID) | ORAL | Status: DC
  Administered 2020-12-07 – 2020-12-14 (×29): 80 mg via ORAL
  Filled 2020-12-07 (×29): qty 1

## 2020-12-07 MED ORDER — SENNA 8.6 MG PO TABS: 1.0000 | ORAL_TABLET | Freq: Every day | ORAL | Status: DC

## 2020-12-07 NOTE — Progress Notes (Signed)
[facility] Progress Note  Bryce Mendoza 60 y.o. [birth year]  CC:  Necrotizing Pancreatitis   Subjective: Patient states his pain is currently well controlled with pain medication. Without pain medication his pain is at an 8 out of 10. Just finished a breakfast of clear liquids and has tolerated it well so far. States last time it took 12 hours before he began having nausea and vomiting so he is hoping the breakfast does well. Denies nausea/vomiting now.   ROS : Review of Systems  Cardiovascular:  Negative for chest pain and palpitations.  Gastrointestinal:  Positive for abdominal pain. Negative for blood in stool, constipation, diarrhea, heartburn, melena, nausea and vomiting.     Objective: Vital signs in last 24 hours: Vitals:   12/06/20 2101 12/07/20 0446  BP: (!) 144/83 123/80  Pulse: 64 75  Resp: 14 14  Temp: 98.2 F (36.8 C) 97.6 F (36.4 C)  SpO2: 95% 98%    Physical Exam:  General:  Alert, cooperative, no distress  Head:  Normocephalic, without obvious abnormality, atraumatic  Eyes:  Anicteric sclera, EOM's intact  Lungs:   Clear to auscultation bilaterally, respirations unlabored  Heart:  Regular rate and rhythm, S1, S2 normal  Abdomen:   Soft, non-tender, bowel sounds active all four quadrants,  no masses,     Lab Results: Recent Labs    12/05/20 0535 12/07/20 0537  NA 135 139  K 4.1 3.5  CL 105 108  CO2 21* 26  GLUCOSE 115* 114*  BUN <5* <5*  CREATININE 0.61 0.70  CALCIUM 8.3* 8.7*  MG 1.6*  --    No results for input(s): AST, ALT, ALKPHOS, BILITOT, PROT, ALBUMIN in the last 72 hours. Recent Labs    12/05/20 0535  WBC 7.8  NEUTROABS 4.6  HGB 14.5  HCT 43.7  MCV 95.2  PLT 145*   No results for input(s): LABPROT, INR in the last 72 hours.    Assessment Necrotizing Pancreatitis; chronic pancreatitis with calcifications seen on imaging and hx of diabetes. - MRCP 10/3: Necrotizing pancreatitis with complex pancreatic pseudocysts in  the head, body and tail of the pancreas, as detailed above. Severe hepatic steatosis. - No leukocytosis  - HGB 14.5 - AST 53/ALT 41/ Alk phos 93 10/3 - Lipase 29 10/1   Ileus - Abdominal xray 10/5: Increase caliber of several central loops of small bowel compatible with ileus or partial obstruction. - Abdominal xray 10/06: The bowel gas pattern is normal. No radio-opaque calculi or other significant radiographic abnormality are seen   Plan: Continue to encourage mobilization.  Continue supportive care and pain management Continue pancreatic enzymes Continue sucralfate BID Continue pantoprazole QD  If feeling up to it, can do full liquids at lunch time.  [facility] will follow  Garnette Scheuermann PA-C 12/07/2020, 10:39 AM  Contact #  [phone]

## 2020-12-07 NOTE — Progress Notes (Addendum)
PROGRESS NOTE  Bryce Mendoza HYI:[id number] DOB: 05-Apr-1960 DOA: 12/01/2020 PCP: Claiborne Rigg, NP  HPI/Recap of past 81 hours: 60 year old male with history of alcohol recovering history of DVT with multiple episodes of necrotizing pancreatitis and pseudocyst was admitted December 01, 2020 for abdominal pain CT scan revealed concern for necrotizing pancreatitis with pseudocyst MRCP showed necrotizing pancreatitis with complex pancreatic pseudocyst in the head tail and body of the pancreas.  His hospital course was complicated by development of ileus he has been managed conservatively with bowel rest  Subjective: December 07, 2020: Patient seen and examined at bedside he stated he is doing little better still has some pain and cannot keep anything down nausea and complaint has not had any bowel movement he feels his bowels is locked up.  Patient stated he has been trying to walk around  Assessment/Plan: Principal Problem:   Chronic pancreatitis (HCC) Active Problems:   CIDP (chronic inflammatory demyelinating polyneuropathy) (HCC)   Necrotizing pancreatitis   DM type 2 (diabetes mellitus, type 2) (HCC)   Protein-calorie malnutrition, severe   Morbid obesity (HCC)  1.  Ileus Improving still not tolerating food Continue IV hydration  2.  Necrotizing fasciitis recurrent Responding to conservative management Followed by [facility] Continue pain management  3.  History of DVT/PE continue Eliquis  4.  Type 2 diabetes mellitus continue sliding scale insulin Home meds held due to being n.p.o. and not tolerating food well  5.  Morbid obesity  6.  Protein malnutrition  7.  Chronic inflammatory demyelinating polyneuropathy: CIDP Continue Pamelor  8.  Patient feels like his bowels is locked up I will start him on Colace gentle stool softener  Code Status: Full  Severity of Illness: The appropriate patient status for this patient is INPATIENT. Inpatient status is judged to be  reasonable and necessary in order to provide the required intensity of service to ensure the patient's safety. The patient's presenting symptoms, physical exam findings, and initial radiographic and laboratory data in the context of their chronic comorbidities is felt to place them at high risk for further clinical deterioration. Furthermore, it is not anticipated that the patient will be medically stable for discharge from the hospital within 2 midnights of admission. The following factors support the patient status of inpatient.   " Still unable to tolerate p.o.  * I certify that at the point of admission it is my clinical judgment that the patient will require inpatient hospital care spanning beyond 2 midnights from the point of admission due to high intensity of service, high risk for further deterioration and high frequency of surveillance required.*   Family Communication: Patient  Disposition Plan: Home Status is: Inpatient   Dispo: The patient is from: Home              Anticipated d/c is to:               Anticipated d/c date is:               Patient currently not medically stable for discharge  Consultants: GI General surgery  Procedures: None  Antimicrobials: None  DVT prophylaxis: Eliquis   Objective: Vitals:   12/06/20 0500 12/06/20 1443 12/06/20 2101 12/07/20 0446  BP:  129/86 (!) 144/83 123/80  Pulse:  68 64 75  Resp:  18 14 14   Temp:  (!) 97.5 F (36.4 C) 98.2 F (36.8 C) 97.6 F (36.4 C)  TempSrc:  Oral Oral Oral  SpO2:  98% 95%  98%  Weight: 126.9 kg   127 kg  Height:        Intake/Output Summary (Last 24 hours) at 12/07/2020 0917 Last data filed at 12/07/2020 0452 Gross per 24 hour  Intake 1080 ml  Output 1125 ml  Net -45 ml   Filed Weights   12/05/20 0516 12/06/20 0500 12/07/20 0446  Weight: 126.2 kg 126.9 kg 127 kg   Body mass index is 36.94 kg/m.  Exam:  General: 60 y.o. year-old male well developed well nourished in no acute distress.   Alert and oriented x3. Cardiovascular: Regular rate and rhythm with no rubs or gallops.  No thyromegaly or JVD noted.   Respiratory: Clear to auscultation with no wheezes or rales. Good inspiratory effort. Abdomen: Soft nontender nondistended with normal bowel sounds x4 quadrants. Musculoskeletal: No lower extremity edema. 2/4 pulses in all 4 extremities. Skin: No ulcerative lesions noted or rashes, Psychiatry: Mood is appropriate for condition and setting Neurology:    Data Reviewed: CBC: Recent Labs  Lab 12/01/20 2244 12/02/20 0756 12/03/20 0835 12/04/20 0530 12/05/20 0535  WBC 13.0* 9.8 5.4 6.5 7.8  NEUTROABS 6.7  --  2.4  --  4.6  HGB 17.6* 14.7 13.8 13.3 14.5  HCT 52.5* 43.2 41.4 40.2 43.7  MCV 94.8 93.9 94.7 95.5 95.2  PLT 248 191 146* 157 145*   Basic Metabolic Panel: Recent Labs  Lab 12/02/20 0756 12/03/20 0835 12/04/20 0530 12/05/20 0535 12/07/20 0537  NA 142 137 135 135 139  K 4.0 3.4* 4.3 4.1 3.5  CL 107 105 109 105 108  CO2 24 24 23  21* 26  GLUCOSE 221* 156* 153* 115* 114*  BUN 6 <5* <5* <5* <5*  CREATININE 0.76 0.65 0.67 0.61 0.70  CALCIUM 8.9 8.1* 8.3* 8.3* 8.7*  MG  --  1.5* 1.9 1.6*  --   PHOS  --  3.1  --   --   --    GFR: Estimated Creatinine Clearance: 140.5 mL/min (by C-G formula based on SCr of 0.7 mg/dL). Liver Function Tests: Recent Labs  Lab 12/01/20 2244 12/02/20 0756 12/03/20 0835  AST 84* 53* 38  ALT 56* 41 29  ALKPHOS 120 93 77  BILITOT 0.6 0.7 1.0  PROT 8.9* 7.0 5.7*  ALBUMIN 4.5 3.5 2.9*   Recent Labs  Lab 12/01/20 2244 12/03/20 0835 12/07/20 0537  LIPASE 28 25 24    No results for input(s): AMMONIA in the last 168 hours. Coagulation Profile: No results for input(s): INR, PROTIME in the last 168 hours. Cardiac Enzymes: No results for input(s): CKTOTAL, CKMB, CKMBINDEX, TROPONINI in the last 168 hours. BNP (last 3 results) No results for input(s): PROBNP in the last 8760 hours. HbA1C: No results for input(s):  HGBA1C in the last 72 hours. CBG: Recent Labs  Lab 12/05/20 2034 12/06/20 0801 12/06/20 1706 12/06/20 2103 12/07/20 0758  GLUCAP 147* 104* 115* 127* 139*   Lipid Profile: No results for input(s): CHOL, HDL, LDLCALC, TRIG, CHOLHDL, LDLDIRECT in the last 72 hours. Thyroid Function Tests: No results for input(s): TSH, T4TOTAL, FREET4, T3FREE, THYROIDAB in the last 72 hours. Anemia Panel: No results for input(s): VITAMINB12, FOLATE, FERRITIN, TIBC, IRON, RETICCTPCT in the last 72 hours. Urine analysis:    Component Value Date/Time   COLORURINE AMBER (A) 12/01/2020 2335   APPEARANCEUR HAZY (A) 12/01/2020 2335   LABSPEC 1.019 12/01/2020 2335   PHURINE 5.0 12/01/2020 2335   GLUCOSEU 50 (A) 12/01/2020 2335   HGBUR NEGATIVE 12/01/2020 2335   BILIRUBINUR  NEGATIVE 12/01/2020 2335   KETONESUR 5 (A) 12/01/2020 2335   PROTEINUR 100 (A) 12/01/2020 2335   NITRITE NEGATIVE 12/01/2020 2335   LEUKOCYTESUR NEGATIVE 12/01/2020 2335   Sepsis Labs: @LABRCNTIP (procalcitonin:4,lacticidven:4)  ) Recent Results (from the past 240 hour(s))  Resp Panel by RT-PCR (Flu A&B, Covid) Nasopharyngeal Swab     Status: None   Collection Time: 12/02/20  2:31 AM   Specimen: Nasopharyngeal Swab; Nasopharyngeal(NP) swabs in vial transport medium  Result Value Ref Range Status   SARS Coronavirus 2 by RT PCR NEGATIVE NEGATIVE Final    Comment: (NOTE) SARS-CoV-2 target nucleic acids are NOT DETECTED.  The SARS-CoV-2 RNA is generally detectable in upper respiratory specimens during the acute phase of infection. The lowest concentration of SARS-CoV-2 viral copies this assay can detect is 138 copies/mL. A negative result does not preclude SARS-Cov-2 infection and should not be used as the sole basis for treatment or other patient management decisions. A negative result may occur with  improper specimen collection/handling, submission of specimen other than nasopharyngeal swab, presence of viral mutation(s)  within the areas targeted by this assay, and inadequate number of viral copies(<138 copies/mL). A negative result must be combined with clinical observations, patient history, and epidemiological information. The expected result is Negative.  Fact Sheet for Patients:  02/01/21  Fact Sheet for Healthcare Providers:  BloggerCourse.com  This test is no t yet approved or cleared by the SeriousBroker.[place] FDA and  has been authorized for detection and/or diagnosis of SARS-CoV-2 by FDA under an Emergency Use Authorization (EUA). This EUA will remain  in effect (meaning this test can be used) for the duration of the COVID-19 declaration under Section 564(b)(1) of the Act, 21 U.S.C.section 360bbb-3(b)(1), unless the authorization is terminated  or revoked sooner.       Influenza A by PCR NEGATIVE NEGATIVE Final   Influenza B by PCR NEGATIVE NEGATIVE Final    Comment: (NOTE) The Xpert Xpress SARS-CoV-2/FLU/RSV plus assay is intended as an aid in the diagnosis of influenza from Nasopharyngeal swab specimens and should not be used as a sole basis for treatment. Nasal washings and aspirates are unacceptable for Xpert Xpress SARS-CoV-2/FLU/RSV testing.  Fact Sheet for Patients: [place]  Fact Sheet for Healthcare Providers: BloggerCourse.com  This test is not yet approved or cleared by the SeriousBroker.it FDA and has been authorized for detection and/or diagnosis of SARS-CoV-2 by FDA under an Emergency Use Authorization (EUA). This EUA will remain in effect (meaning this test can be used) for the duration of the COVID-19 declaration under Section 564(b)(1) of the Act, 21 U.S.C. section 360bbb-3(b)(1), unless the authorization is terminated or revoked.  Performed at [facility], [address], [place], [place]       Studies: No results  found.  Scheduled Meds:  apixaban  5 mg Oral BID   atorvastatin  40 mg Oral Daily   dicyclomine  10 mg Oral TID AC   feeding supplement  237 mL Oral TID BM   insulin aspart  0-15 Units Subcutaneous TID WC   lipase/protease/amylase  24,000 Units Oral TID AC   multivitamin with minerals  1 tablet Oral Daily   nortriptyline  30 mg Oral BID   pantoprazole  40 mg Oral Daily   sucralfate  1 g Oral BID    Continuous Infusions:  sodium chloride 100 mL/hr at 12/06/20 1738     LOS: 4 days     02/05/21, MD [facility]  To  reach me or the doctor on call, go to: [email or web address] Password [email or web address]  12/07/2020, 9:17 AM

## 2020-12-08 DIAGNOSIS — K861 Other chronic pancreatitis: Secondary | ICD-10-CM | POA: Diagnosis not present

## 2020-12-08 DIAGNOSIS — G6181 Chronic inflammatory demyelinating polyneuritis: Secondary | ICD-10-CM | POA: Diagnosis not present

## 2020-12-08 DIAGNOSIS — K8591 Acute pancreatitis with uninfected necrosis, unspecified: Secondary | ICD-10-CM | POA: Diagnosis not present

## 2020-12-08 LAB — GLUCOSE, CAPILLARY
Glucose-Capillary: 102 mg/dL — ABNORMAL HIGH (ref 70–99)
Glucose-Capillary: 127 mg/dL — ABNORMAL HIGH (ref 70–99)

## 2020-12-08 LAB — BASIC METABOLIC PANEL
Anion gap: 9 (ref 5–15)
BUN: <5 mg/dL — ABNORMAL LOW (ref 6–20)
CO2: 29 mmol/L (ref 22–32)
Calcium: 9.2 mg/dL (ref 8.9–10.3)
Chloride: 106 mmol/L (ref 98–111)
Creatinine, Ser: 0.81 mg/dL (ref 0.61–1.24)
GFR, Estimated: >60 mL/min (ref >60)
Glucose, Bld: 124 mg/dL — ABNORMAL HIGH (ref 70–99)
Potassium: 4 mmol/L (ref 3.5–5.1)
Sodium: 144 mmol/L (ref 135–145)

## 2020-12-08 LAB — LIPASE, BLOOD: Lipase: 22 U/L (ref 11–51)

## 2020-12-08 MED ORDER — POLYETHYLENE GLYCOL 3350 17 G PO PACK
17.0000 g | PACK | Freq: Two times a day (BID) | ORAL | Status: DC
  Administered 2020-12-08 – 2020-12-14 (×11): 17 g via ORAL
  Filled 2020-12-08 (×12): qty 1

## 2020-12-08 MED ORDER — HYDROMORPHONE HCL 1 MG/ML IJ SOLN
1.0000 mg | Freq: Once | INTRAMUSCULAR | Status: AC
Start: 2020-12-08 — End: 2020-12-08
  Administered 2020-12-08: 1 mg via INTRAVENOUS
  Filled 2020-12-08: qty 1

## 2020-12-08 MED ORDER — SENNA 8.6 MG PO TABS
1.0000 | ORAL_TABLET | Freq: Once | ORAL | Status: AC
  Administered 2020-12-08: 8.6 mg via ORAL
  Filled 2020-12-08: qty 1

## 2020-12-08 NOTE — Plan of Care (Signed)
  Problem: Health Behavior/Discharge Planning: Goal: Ability to manage health-related needs will improve Outcome: Progressing   Problem: Nutrition: Goal: Adequate nutrition will be maintained Outcome: Progressing   Problem: Pain Managment: Goal: General experience of comfort will improve Outcome: Progressing   Problem: Clinical Measurements: Goal: Diagnostic test results will improve Outcome: Completed/Met Goal: Respiratory complications will improve Outcome: Completed/Met

## 2020-12-08 NOTE — Progress Notes (Signed)
PROGRESS NOTE  Bryce Mendoza GNO:[id number] DOB: 09/30/1960 DOA: 12/01/2020 PCP: Claiborne Rigg, NP  HPI/Recap of past 72 hours: 60 year old male with history of alcohol recovering history of DVT with multiple episodes of necrotizing pancreatitis and pseudocyst was admitted December 01, 2020 for abdominal pain CT scan revealed concern for necrotizing pancreatitis with pseudocyst MRCP showed necrotizing pancreatitis with complex pancreatic pseudocyst in the head tail and body of the pancreas.  His hospital course was complicated by development of ileus he has been managed conservatively with bowel rest  Subjective: December 07, 2020: Patient seen and examined at bedside he stated he is doing little better still has some pain and cannot keep anything down nausea and complaint has not had any bowel movement he feels his bowels is locked up.  Patient stated he has been trying to walk around  December 08, 2020: Patient seen and examined at bedside he stated that he still has pain and vomiting.  That he vomited this morning after breakfast.  He stated he is alternating the IV pain medicine and oral hydrocodone Percocet to try to get adequate or tolerable pain relief. Still has not had any bowel movement after her seen the patient and nurse texted me and stated that patient told her his pain has not been below 6/20 and he was asking for an extra dose of Dilaudid to make it 2 mg for 1 time to help reduce the pain  Assessment/Plan: Principal Problem:   Chronic pancreatitis (HCC) Active Problems:   CIDP (chronic inflammatory demyelinating polyneuropathy) (HCC)   Necrotizing pancreatitis   DM type 2 (diabetes mellitus, type 2) (HCC)   Protein-calorie malnutrition, severe   Morbid obesity (HCC)  1.  Ileus Improving still not tolerating food Continue IV hydration  2.  Necrotizing pancreatitis recurrent Responding to conservative management Followed by [facility] Continue pain management Patient still  not able to tolerate by mouth.  I will give him an extra dose of Dilaudid to make it due to of 2 mg today and then he will continue on his scheduled as needed dose  3.  History of DVT/PE continue Eliquis  4.  Type 2 diabetes mellitus continue sliding scale insulin Home meds held due to being n.p.o. and not tolerating food well  5.  Morbid obesity  6.  Protein malnutrition.  Patient is not able to tolerate p.o. yet due to nausea vomiting and abdominal pain  7.  Chronic inflammatory demyelinating polyneuropathy: CIDP Continue Pamelor  8.  Patient feels like his bowels is locked up I will start him on Colace gentle stool softener Patient has not made a bowel movement yet so I will add MiraLAX twice a day and one-time dose of senna p.o.  Code Status: Full  Severity of Illness: The appropriate patient status for this patient is INPATIENT. Inpatient status is judged to be reasonable and necessary in order to provide the required intensity of service to ensure the patient's safety. The patient's presenting symptoms, physical exam findings, and initial radiographic and laboratory data in the context of their chronic comorbidities is felt to place them at high risk for further clinical deterioration. Furthermore, it is not anticipated that the patient will be medically stable for discharge from the hospital within 2 midnights of admission. The following factors support the patient status of inpatient.   " Still unable to tolerate p.o.  * I certify that at the point of admission it is my clinical judgment that the patient will require inpatient hospital  care spanning beyond 2 midnights from the point of admission due to high intensity of service, high risk for further deterioration and high frequency of surveillance required.*   Family Communication: Patient  Disposition Plan: Home Status is: Inpatient   Dispo: The patient is from: Home              Anticipated d/c is to:                Anticipated d/c date is:               Patient currently not medically stable for discharge  Consultants: GI General surgery  Procedures: None  Antimicrobials: None  DVT prophylaxis: Eliquis   Objective: Vitals:   12/07/20 1406 12/07/20 2212 12/08/20 0500 12/08/20 0631  BP: 133/80 128/74  (!) 150/91  Pulse: 72 81  71  Resp: 16 18  18   Temp: 98.1 F (36.7 C) 97.9 F (36.6 C)  97.8 F (36.6 C)  TempSrc: Oral Oral  Oral  SpO2: 98% 94%  100%  Weight:   126 kg   Height:        Intake/Output Summary (Last 24 hours) at 12/08/2020 1021 Last data filed at 12/08/2020 0919 Gross per 24 hour  Intake 2657 ml  Output 1850 ml  Net 807 ml    Filed Weights   12/06/20 0500 12/07/20 0446 12/08/20 0500  Weight: 126.9 kg 127 kg 126 kg   Body mass index is 36.65 kg/m.  Exam:  General: 60 y.o. year-old male well developed well nourished in no acute distress.  Alert and oriented x3. Cardiovascular: Regular rate and rhythm with no rubs or gallops.  No thyromegaly or JVD noted.   Respiratory: Clear to auscultation with no wheezes or rales. Good inspiratory effort. Abdomen: Soft nontender nondistended with normal bowel sounds x4 quadrants. Musculoskeletal: No lower extremity edema. 2/4 pulses in all 4 extremities. Skin: No ulcerative lesions noted or rashes, Psychiatry: Mood is appropriate for condition and setting Neurology:    Data Reviewed: CBC: Recent Labs  Lab 12/01/20 2244 12/02/20 0756 12/03/20 0835 12/04/20 0530 12/05/20 0535  WBC 13.0* 9.8 5.4 6.5 7.8  NEUTROABS 6.7  --  2.4  --  4.6  HGB 17.6* 14.7 13.8 13.3 14.5  HCT 52.5* 43.2 41.4 40.2 43.7  MCV 94.8 93.9 94.7 95.5 95.2  PLT 248 191 146* 157 145*    Basic Metabolic Panel: Recent Labs  Lab 12/03/20 0835 12/04/20 0530 12/05/20 0535 12/07/20 0537 12/08/20 0623  NA 137 135 135 139 144  K 3.4* 4.3 4.1 3.5 4.0  CL 105 109 105 108 106  CO2 24 23 21* 26 29  GLUCOSE 156* 153* 115* 114* 124*  BUN <5*  <5* <5* <5* <5*  CREATININE 0.65 0.67 0.61 0.70 0.81  CALCIUM 8.1* 8.3* 8.3* 8.7* 9.2  MG 1.5* 1.9 1.6*  --   --   PHOS 3.1  --   --   --   --     GFR: Estimated Creatinine Clearance: 138.2 mL/min (by C-G formula based on SCr of 0.81 mg/dL). Liver Function Tests: Recent Labs  Lab 12/01/20 2244 12/02/20 0756 12/03/20 0835  AST 84* 53* 38  ALT 56* 41 29  ALKPHOS 120 93 77  BILITOT 0.6 0.7 1.0  PROT 8.9* 7.0 5.7*  ALBUMIN 4.5 3.5 2.9*    Recent Labs  Lab 12/01/20 2244 12/03/20 0835 12/07/20 0537  LIPASE 28 25 24     No results for input(s): AMMONIA in the  last 168 hours. Coagulation Profile: No results for input(s): INR, PROTIME in the last 168 hours. Cardiac Enzymes: No results for input(s): CKTOTAL, CKMB, CKMBINDEX, TROPONINI in the last 168 hours. BNP (last 3 results) No results for input(s): PROBNP in the last 8760 hours. HbA1C: No results for input(s): HGBA1C in the last 72 hours. CBG: Recent Labs  Lab 12/06/20 1706 12/06/20 2103 12/07/20 0758 12/07/20 1136 12/07/20 2214  GLUCAP 115* 127* 139* 115* 152*    Lipid Profile: No results for input(s): CHOL, HDL, LDLCALC, TRIG, CHOLHDL, LDLDIRECT in the last 72 hours. Thyroid Function Tests: No results for input(s): TSH, T4TOTAL, FREET4, T3FREE, THYROIDAB in the last 72 hours. Anemia Panel: No results for input(s): VITAMINB12, FOLATE, FERRITIN, TIBC, IRON, RETICCTPCT in the last 72 hours. Urine analysis:    Component Value Date/Time   COLORURINE AMBER (A) 12/01/2020 2335   APPEARANCEUR HAZY (A) 12/01/2020 2335   LABSPEC 1.019 12/01/2020 2335   PHURINE 5.0 12/01/2020 2335   GLUCOSEU 50 (A) 12/01/2020 2335   HGBUR NEGATIVE 12/01/2020 2335   BILIRUBINUR NEGATIVE 12/01/2020 2335   KETONESUR 5 (A) 12/01/2020 2335   PROTEINUR 100 (A) 12/01/2020 2335   NITRITE NEGATIVE 12/01/2020 2335   LEUKOCYTESUR NEGATIVE 12/01/2020 2335   Sepsis Labs: @LABRCNTIP (procalcitonin:4,lacticidven:4)  ) Recent Results (from  the past 240 hour(s))  Resp Panel by RT-PCR (Flu A&B, Covid) Nasopharyngeal Swab     Status: None   Collection Time: 12/02/20  2:31 AM   Specimen: Nasopharyngeal Swab; Nasopharyngeal(NP) swabs in vial transport medium  Result Value Ref Range Status   SARS Coronavirus 2 by RT PCR NEGATIVE NEGATIVE Final    Comment: (NOTE) SARS-CoV-2 target nucleic acids are NOT DETECTED.  The SARS-CoV-2 RNA is generally detectable in upper respiratory specimens during the acute phase of infection. The lowest concentration of SARS-CoV-2 viral copies this assay can detect is 138 copies/mL. A negative result does not preclude SARS-Cov-2 infection and should not be used as the sole basis for treatment or other patient management decisions. A negative result may occur with  improper specimen collection/handling, submission of specimen other than nasopharyngeal swab, presence of viral mutation(s) within the areas targeted by this assay, and inadequate number of viral copies(<138 copies/mL). A negative result must be combined with clinical observations, patient history, and epidemiological information. The expected result is Negative.  Fact Sheet for Patients:  02/01/21  Fact Sheet for Healthcare Providers:  BloggerCourse.com  This test is no t yet approved or cleared by the [employer] FDA and  has been authorized for detection and/or diagnosis of SARS-CoV-2 by FDA under an Emergency Use Authorization (EUA). This EUA will remain  in effect (meaning this test can be used) for the duration of the COVID-19 declaration under Section 564(b)(1) of the Act, 21 U.S.C.section 360bbb-3(b)(1), unless the authorization is terminated  or revoked sooner.       Influenza A by PCR NEGATIVE NEGATIVE Final   Influenza B by PCR NEGATIVE NEGATIVE Final    Comment: (NOTE) The Xpert Xpress SARS-CoV-2/FLU/RSV plus assay is intended as an aid in the diagnosis of  influenza from Nasopharyngeal swab specimens and should not be used as a sole basis for treatment. Nasal washings and aspirates are unacceptable for Xpert Xpress SARS-CoV-2/FLU/RSV testing.  Fact Sheet for Patients: [place]  Fact Sheet for Healthcare Providers: BloggerCourse.com  This test is not yet approved or cleared by the [employer] FDA and has been authorized for detection and/or diagnosis of SARS-CoV-2 by FDA under an Emergency Use Authorization (  EUA). This EUA will remain in effect (meaning this test can be used) for the duration of the COVID-19 declaration under Section 564(b)(1) of the Act, 21 U.S.C. section 360bbb-3(b)(1), unless the authorization is terminated or revoked.  Performed at [facility], [address], [place], [place] [address]       Studies: No results found.  Scheduled Meds:  apixaban  5 mg Oral BID   atorvastatin  40 mg Oral Daily   dicyclomine  10 mg Oral TID AC   docusate sodium  100 mg Oral BID   feeding supplement  237 mL Oral TID BM   insulin aspart  0-15 Units Subcutaneous TID WC   lipase/protease/amylase  24,000 Units Oral TID AC   multivitamin with minerals  1 tablet Oral Daily   nortriptyline  30 mg Oral BID   pantoprazole  40 mg Oral Daily   simethicone  80 mg Oral QID   sucralfate  1 g Oral BID    Continuous Infusions:  sodium chloride 100 mL/hr at 12/08/20 0037     LOS: 5 days     Myrtie Neither, MD [facility]  To reach me or the doctor on call, go to: [email or web address] Password [email or web address]  12/08/2020, 10:21 AM

## 2020-12-08 NOTE — Progress Notes (Signed)
Subjective: Patient reports vomiting today morning. Pain is fairly controlled. He is passing flatus but has not had a bowel movement.  Objective: Vital signs in last 24 hours: Temp:  [97.8 F (36.6 C)-98.1 F (36.7 C)] 97.8 F (36.6 C) (10/08 0631) Pulse Rate:  [71-81] 71 (10/08 0631) Resp:  [16-18] 18 (10/08 0631) BP: (128-150)/(74-91) 150/91 (10/08 0631) SpO2:  [94 %-100 %] 100 % (10/08 0631) Weight:  [403 kg] 126 kg (10/08 0500) Weight change: -1 kg Last BM Date: 12/04/20  PE: Does not appear uncomfortable or in pain GENERAL: Able to speak in full sentences, not in distress  ABDOMEN: Distended and tender upper abdomen, soft lower abdomen with normoactive bowel sounds EXTREMITIES: No deformity  Lab Results: Results for orders placed or performed during the hospital encounter of 12/01/20 (from the past 48 hour(s))  Glucose, capillary     Status: Abnormal   Collection Time: 12/06/20 12:52 PM  Result Value Ref Range   Glucose-Capillary 168 (H) 70 - 99 mg/dL    Comment: Glucose reference range applies only to samples taken after fasting for at least 8 hours.  Glucose, capillary     Status: Abnormal   Collection Time: 12/06/20  5:06 PM  Result Value Ref Range   Glucose-Capillary 115 (H) 70 - 99 mg/dL    Comment: Glucose reference range applies only to samples taken after fasting for at least 8 hours.  Glucose, capillary     Status: Abnormal   Collection Time: 12/06/20  9:03 PM  Result Value Ref Range   Glucose-Capillary 127 (H) 70 - 99 mg/dL    Comment: Glucose reference range applies only to samples taken after fasting for at least 8 hours.  Basic metabolic panel     Status: Abnormal   Collection Time: 12/07/20  5:37 AM  Result Value Ref Range   Sodium 139 135 - 145 mmol/L   Potassium 3.5 3.5 - 5.1 mmol/L   Chloride 108 98 - 111 mmol/L   CO2 26 22 - 32 mmol/L   Glucose, Bld 114 (H) 70 - 99 mg/dL    Comment: Glucose reference range applies only to samples taken after  fasting for at least 8 hours.   BUN <5 (L) 6 - 20 mg/dL   Creatinine, Ser 4.74 0.61 - 1.24 mg/dL   Calcium 8.7 (L) 8.9 - 10.3 mg/dL   GFR, Estimated >25 >95 mL/min    Comment: (NOTE) Calculated using the CKD-EPI Creatinine Equation (2021)    Anion gap 5 5 - 15    Comment: Performed at [facility], [address], [place], [place] [address]  Lipase, blood     Status: None   Collection Time: 12/07/20  5:37 AM  Result Value Ref Range   Lipase 24 11 - 51 U/L    Comment: Performed at [facility], [address], [place], [place] [address]  Glucose, capillary     Status: Abnormal   Collection Time: 12/07/20  7:58 AM  Result Value Ref Range   Glucose-Capillary 139 (H) 70 - 99 mg/dL    Comment: Glucose reference range applies only to samples taken after fasting for at least 8 hours.  Glucose, capillary     Status: Abnormal   Collection Time: 12/07/20 11:36 AM  Result Value Ref Range   Glucose-Capillary 115 (H) 70 - 99 mg/dL    Comment: Glucose reference range applies only to samples taken after fasting for at least 8 hours.  Glucose, capillary  Status: Abnormal   Collection Time: 12/07/20 10:14 PM  Result Value Ref Range   Glucose-Capillary 152 (H) 70 - 99 mg/dL    Comment: Glucose reference range applies only to samples taken after fasting for at least 8 hours.   Comment 1 Notify RN    Comment 2 Document in Chart   Basic metabolic panel     Status: Abnormal   Collection Time: 12/08/20  6:23 AM  Result Value Ref Range   Sodium 144 135 - 145 mmol/L   Potassium 4.0 3.5 - 5.1 mmol/L   Chloride 106 98 - 111 mmol/L   CO2 29 22 - 32 mmol/L   Glucose, Bld 124 (H) 70 - 99 mg/dL    Comment: Glucose reference range applies only to samples taken after fasting for at least 8 hours.   BUN <5 (L) 6 - 20 mg/dL   Creatinine, Ser 8.33 0.61 - 1.24 mg/dL   Calcium 9.2 8.9 - 38.3 mg/dL   GFR, Estimated >29 >19 mL/min    Comment: (NOTE) Calculated using the  CKD-EPI Creatinine Equation (2021)    Anion gap 9 5 - 15    Comment: Performed at [facility], [address] [place], [place] [address]  Lipase, blood     Status: None   Collection Time: 12/08/20  6:38 AM  Result Value Ref Range   Lipase 22 11 - 51 U/L    Comment: Performed at [facility], [address], [place], [place] [address]    Studies/Results: No results found.  Medications: I have reviewed the patient's current medications.  Assessment: Necrotizing pancreatitis with complex pancreatic pseudocyst (11.2 x 3.8 x 3.6 cm , smaller collection 2.2 x 1.1 cm ) in head, body, tail of pancreas, last imaging on 12/03/2020  Severe hepatic steatosis  Plan: Continue supportive management, patient wants to continue full liquids but wants to eat smaller amounts and wants to eat slower. Continue IV fluids at 100 cc an hour Continue pancreatic enzymes, PPI once a day. Will DC sucralfate today. Encouraged to continue mobilization.  Kerin Salen, MD 12/08/2020, 12:16 PM

## 2020-12-09 DIAGNOSIS — G6181 Chronic inflammatory demyelinating polyneuritis: Secondary | ICD-10-CM | POA: Diagnosis not present

## 2020-12-09 DIAGNOSIS — K8591 Acute pancreatitis with uninfected necrosis, unspecified: Secondary | ICD-10-CM | POA: Diagnosis not present

## 2020-12-09 DIAGNOSIS — K861 Other chronic pancreatitis: Secondary | ICD-10-CM | POA: Diagnosis not present

## 2020-12-09 LAB — BASIC METABOLIC PANEL
Anion gap: 8 (ref 5–15)
BUN: <5 mg/dL — ABNORMAL LOW (ref 6–20)
CO2: 28 mmol/L (ref 22–32)
Calcium: 9.1 mg/dL (ref 8.9–10.3)
Chloride: 104 mmol/L (ref 98–111)
Creatinine, Ser: 0.71 mg/dL (ref 0.61–1.24)
GFR, Estimated: >60 mL/min (ref >60)
Glucose, Bld: 113 mg/dL — ABNORMAL HIGH (ref 70–99)
Potassium: 3.8 mmol/L (ref 3.5–5.1)
Sodium: 140 mmol/L (ref 135–145)

## 2020-12-09 NOTE — Progress Notes (Signed)
PROGRESS NOTE  Bryce Mendoza PYK:[id number] DOB: Jul 18, 1960 DOA: 12/01/2020 PCP: Claiborne Rigg, NP  HPI/Recap of past 69 hours: 60 year old male with history of alcohol recovering history of DVT with multiple episodes of necrotizing pancreatitis and pseudocyst was admitted December 01, 2020 for abdominal pain CT scan revealed concern for necrotizing pancreatitis with pseudocyst MRCP showed necrotizing pancreatitis with complex pancreatic pseudocyst in the head tail and body of the pancreas.  His hospital course was complicated by development of ileus he has been managed conservatively with bowel rest  Subjective: December 07, 2020: Patient seen and examined at bedside he stated he is doing little better still has some pain and cannot keep anything down nausea and complaint has not had any bowel movement he feels his bowels is locked up.  Patient stated he has been trying to walk around  December 08, 2020: Patient seen and examined at bedside he stated that he still has pain and vomiting.  That he vomited this morning after breakfast.  He stated he is alternating the IV pain medicine and oral hydrocodone Percocet to try to get adequate or tolerable pain relief. Still has not had any bowel movement after her seen the patient and nurse texted me and stated that patient told her his pain has not been below 6/20 and he was asking for an extra dose of Dilaudid to make it 2 mg for 1 time to help reduce the pain  December 09, 2020 Patient seen and examined at bedside he still complaining of abdominal pain he said he threw up last night and that it was projectile but this morning he has managed to keep his applesauce or apple juice down  Assessment/Plan: Principal Problem:   Chronic pancreatitis (HCC) Active Problems:   CIDP (chronic inflammatory demyelinating polyneuropathy) (HCC)   Necrotizing pancreatitis   DM type 2 (diabetes mellitus, type 2) (HCC)   Protein-calorie malnutrition, severe   Morbid  obesity (HCC)  1.  Ileus Improving still not tolerating food Continue IV hydration  2.  Necrotizing pancreatitis recurrent Responding to conservative management Followed by [facility] Continue pain management Patient still not able to tolerate by mouth.  I will give him an extra dose of Dilaudid to make it due to of 2 mg today and then he will continue on his scheduled as needed dose  3.  History of DVT/PE continue Eliquis  4.  Type 2 diabetes mellitus continue sliding scale insulin Home meds held due to being n.p.o. and not tolerating food well  5.  Morbid obesity  6.  Protein malnutrition.  Patient is not able to tolerate p.o. yet due to nausea vomiting and abdominal pain  7.  Chronic inflammatory demyelinating polyneuropathy: CIDP Continue Pamelor  8.  Patient feels like his bowels is locked up I will start him on Colace gentle stool softener Patient has not made a bowel movement yet so I will add MiraLAX twice a day and one-time dose of senna p.o.  Code Status: Full  Severity of Illness: The appropriate patient status for this patient is INPATIENT. Inpatient status is judged to be reasonable and necessary in order to provide the required intensity of service to ensure the patient's safety. The patient's presenting symptoms, physical exam findings, and initial radiographic and laboratory data in the context of their chronic comorbidities is felt to place them at high risk for further clinical deterioration. Furthermore, it is not anticipated that the patient will be medically stable for discharge from the hospital within 2  midnights of admission. The following factors support the patient status of inpatient.   " Still unable to tolerate p.o.  * I certify that at the point of admission it is my clinical judgment that the patient will require inpatient hospital care spanning beyond 2 midnights from the point of admission due to high intensity of service, high risk for further  deterioration and high frequency of surveillance required.*   Family Communication: Patient  Disposition Plan: Home Status is: Inpatient   Dispo: The patient is from: Home              Anticipated d/c is to:               Anticipated d/c date is:               Patient currently not medically stable for discharge  Consultants: GI General surgery  Procedures: None  Antimicrobials: None  DVT prophylaxis: Eliquis   Objective: Vitals:   12/08/20 2056 12/09/20 0438 12/09/20 0442 12/09/20 1451  BP: 133/90 (!) 153/92  108/84  Pulse: 63 69  72  Resp: 18 16  15   Temp: 98.5 F (36.9 C) (!) 97.5 F (36.4 C)  97.7 F (36.5 C)  TempSrc: Oral Oral  Oral  SpO2: 99% 97%  96%  Weight:   126.6 kg   Height:        Intake/Output Summary (Last 24 hours) at 12/09/2020 1919 Last data filed at 12/09/2020 1739 Gross per 24 hour  Intake 3301.9 ml  Output 1100 ml  Net 2201.9 ml    Filed Weights   12/07/20 0446 12/08/20 0500 12/09/20 0442  Weight: 127 kg 126 kg 126.6 kg   Body mass index is 36.82 kg/m.  Exam:  General: 60 y.o. year-old male well developed well nourished in no acute distress.  Alert and oriented x3. Cardiovascular: Regular rate and rhythm with no rubs or gallops.  No thyromegaly or JVD noted.   Respiratory: Clear to auscultation with no wheezes or rales. Good inspiratory effort. Abdomen: Soft nontender nondistended with normal bowel sounds x4 quadrants. Musculoskeletal: No lower extremity edema. 2/4 pulses in all 4 extremities. Skin: No ulcerative lesions noted or rashes, Psychiatry: Mood is appropriate for condition and setting Neurology:    Data Reviewed: CBC: Recent Labs  Lab 12/03/20 0835 12/04/20 0530 12/05/20 0535  WBC 5.4 6.5 7.8  NEUTROABS 2.4  --  4.6  HGB 13.8 13.3 14.5  HCT 41.4 40.2 43.7  MCV 94.7 95.5 95.2  PLT 146* 157 145*    Basic Metabolic Panel: Recent Labs  Lab 12/03/20 0835 12/04/20 0530 12/05/20 0535 12/07/20 0537  12/08/20 0623 12/09/20 0601  NA 137 135 135 139 144 140  K 3.4* 4.3 4.1 3.5 4.0 3.8  CL 105 109 105 108 106 104  CO2 24 23 21* 26 29 28   GLUCOSE 156* 153* 115* 114* 124* 113*  BUN <5* <5* <5* <5* <5* <5*  CREATININE 0.65 0.67 0.61 0.70 0.81 0.71  CALCIUM 8.1* 8.3* 8.3* 8.7* 9.2 9.1  MG 1.5* 1.9 1.6*  --   --   --   PHOS 3.1  --   --   --   --   --     GFR: Estimated Creatinine Clearance: 140.4 mL/min (by C-G formula based on SCr of 0.71 mg/dL). Liver Function Tests: Recent Labs  Lab 12/03/20 0835  AST 38  ALT 29  ALKPHOS 77  BILITOT 1.0  PROT 5.7*  ALBUMIN 2.9*  Recent Labs  Lab 12/03/20 0835 12/07/20 0537 12/08/20 0638  LIPASE 25 24 22     No results for input(s): AMMONIA in the last 168 hours. Coagulation Profile: No results for input(s): INR, PROTIME in the last 168 hours. Cardiac Enzymes: No results for input(s): CKTOTAL, CKMB, CKMBINDEX, TROPONINI in the last 168 hours. BNP (last 3 results) No results for input(s): PROBNP in the last 8760 hours. HbA1C: No results for input(s): HGBA1C in the last 72 hours. CBG: Recent Labs  Lab 12/07/20 0758 12/07/20 1136 12/07/20 2214 12/08/20 1549 12/08/20 2144  GLUCAP 139* 115* 152* 102* 127*    Lipid Profile: No results for input(s): CHOL, HDL, LDLCALC, TRIG, CHOLHDL, LDLDIRECT in the last 72 hours. Thyroid Function Tests: No results for input(s): TSH, T4TOTAL, FREET4, T3FREE, THYROIDAB in the last 72 hours. Anemia Panel: No results for input(s): VITAMINB12, FOLATE, FERRITIN, TIBC, IRON, RETICCTPCT in the last 72 hours. Urine analysis:    Component Value Date/Time   COLORURINE AMBER (A) 12/01/2020 2335   APPEARANCEUR HAZY (A) 12/01/2020 2335   LABSPEC 1.019 12/01/2020 2335   PHURINE 5.0 12/01/2020 2335   GLUCOSEU 50 (A) 12/01/2020 2335   HGBUR NEGATIVE 12/01/2020 2335   BILIRUBINUR NEGATIVE 12/01/2020 2335   KETONESUR 5 (A) 12/01/2020 2335   PROTEINUR 100 (A) 12/01/2020 2335   NITRITE NEGATIVE  12/01/2020 2335   LEUKOCYTESUR NEGATIVE 12/01/2020 2335   Sepsis Labs: @LABRCNTIP (procalcitonin:4,lacticidven:4)  ) Recent Results (from the past 240 hour(s))  Resp Panel by RT-PCR (Flu A&B, Covid) Nasopharyngeal Swab     Status: None   Collection Time: 12/02/20  2:31 AM   Specimen: Nasopharyngeal Swab; Nasopharyngeal(NP) swabs in vial transport medium  Result Value Ref Range Status   SARS Coronavirus 2 by RT PCR NEGATIVE NEGATIVE Final    Comment: (NOTE) SARS-CoV-2 target nucleic acids are NOT DETECTED.  The SARS-CoV-2 RNA is generally detectable in upper respiratory specimens during the acute phase of infection. The lowest concentration of SARS-CoV-2 viral copies this assay can detect is 138 copies/mL. A negative result does not preclude SARS-Cov-2 infection and should not be used as the sole basis for treatment or other patient management decisions. A negative result may occur with  improper specimen collection/handling, submission of specimen other than nasopharyngeal swab, presence of viral mutation(s) within the areas targeted by this assay, and inadequate number of viral copies(<138 copies/mL). A negative result must be combined with clinical observations, patient history, and epidemiological information. The expected result is Negative.  Fact Sheet for Patients:   Fact Sheet for Healthcare Providers:  02/01/21  This test is no t yet approved or cleared by the [employer] FDA and  has been authorized for detection and/or diagnosis of SARS-CoV-2 by FDA under an Emergency Use Authorization (EUA). This EUA will remain  in effect (meaning this test can be used) for the duration of the COVID-19 declaration under Section 564(b)(1) of the Act, 21 U.S.C.section 360bbb-3(b)(1), unless the authorization is terminated  or revoked sooner.       Influenza A by PCR NEGATIVE NEGATIVE Final   Influenza B  by PCR NEGATIVE NEGATIVE Final    Comment: (NOTE) The Xpert Xpress SARS-CoV-2/FLU/RSV plus assay is intended as an aid in the diagnosis of influenza from Nasopharyngeal swab specimens and should not be used as a sole basis for treatment. Nasal washings and aspirates are unacceptable for Xpert Xpress SARS-CoV-2/FLU/RSV testing.  Fact Sheet for Patients: [employer]  Fact Sheet for Healthcare Providers: [place]  This test is not yet  approved or cleared by the [place] and has been authorized for detection and/or diagnosis of SARS-CoV-2 by FDA under an Emergency Use Authorization (EUA). This EUA will remain in effect (meaning this test can be used) for the duration of the COVID-19 declaration under Section 564(b)(1) of the Act, 21 U.S.C. section 360bbb-3(b)(1), unless the authorization is terminated or revoked.  Performed at [facility], [address], [place], [place] [address]       Studies: No results found.  Scheduled Meds:  apixaban  5 mg Oral BID   atorvastatin  40 mg Oral Daily   dicyclomine  10 mg Oral TID AC   docusate sodium  100 mg Oral BID   feeding supplement  237 mL Oral TID BM   insulin aspart  0-15 Units Subcutaneous TID WC   lipase/protease/amylase  24,000 Units Oral TID AC   multivitamin with minerals  1 tablet Oral Daily   nortriptyline  30 mg Oral BID   pantoprazole  40 mg Oral Daily   polyethylene glycol  17 g Oral BID   simethicone  80 mg Oral QID    Continuous Infusions:  sodium chloride 100 mL/hr at 12/09/20 1739     LOS: 6 days     Myrtie Neither, MD [facility]  To reach me or the doctor on call, go to: [email or web address] Password [email or web address]  12/09/2020, 7:19 PM

## 2020-12-09 NOTE — Progress Notes (Signed)
Subjective: Patient states he had vomiting after lunch as well as dinner yesterday, however wants to continue full liquid diet, states he will be careful about the amount of food he takes and wants to go slow. Has not had a bowel movement in 3 days but passing flatus.  Objective: Vital signs in last 24 hours: Temp:  [97.5 F (36.4 C)-98.5 F (36.9 C)] 97.5 F (36.4 C) (10/09 0438) Pulse Rate:  [63-70] 69 (10/09 0438) Resp:  [16-18] 16 (10/09 0438) BP: (102-153)/(74-92) 153/92 (10/09 0438) SpO2:  [95 %-99 %] 97 % (10/09 0438) Weight:  [126.6 kg] 126.6 kg (10/09 0442) Weight change: 0.6 kg Last BM Date: 12/04/20  PE: Not in distress, able to speak in full sentences GENERAL: Does not appear to be in pain, appears comfortable  ABDOMEN: Slightly distended upper abdomen, hypoactive bowel sounds, nontender otherwise EXTREMITIES: No deformity  Lab Results: Results for orders placed or performed during the hospital encounter of 12/01/20 (from the past 48 hour(s))  Glucose, capillary     Status: Abnormal   Collection Time: 12/07/20 10:14 PM  Result Value Ref Range   Glucose-Capillary 152 (H) 70 - 99 mg/dL    Comment: Glucose reference range applies only to samples taken after fasting for at least 8 hours.   Comment 1 Notify RN    Comment 2 Document in Chart   Basic metabolic panel     Status: Abnormal   Collection Time: 12/08/20  6:23 AM  Result Value Ref Range   Sodium 144 135 - 145 mmol/L   Potassium 4.0 3.5 - 5.1 mmol/L   Chloride 106 98 - 111 mmol/L   CO2 29 22 - 32 mmol/L   Glucose, Bld 124 (H) 70 - 99 mg/dL    Comment: Glucose reference range applies only to samples taken after fasting for at least 8 hours.   BUN <5 (L) 6 - 20 mg/dL   Creatinine, Ser 5.44 0.61 - 1.24 mg/dL   Calcium 9.2 8.9 - 92.0 mg/dL   GFR, Estimated >10 >07 mL/min    Comment: (NOTE) Calculated using the CKD-EPI Creatinine Equation (2021)    Anion gap 9 5 - 15    Comment: Performed at [facility], [address], [place], [place] [address]  Lipase, blood     Status: None   Collection Time: 12/08/20  6:38 AM  Result Value Ref Range   Lipase 22 11 - 51 U/L    Comment: Performed at [facility], [address] [place], [place] [address]  Glucose, capillary     Status: Abnormal   Collection Time: 12/08/20  3:49 PM  Result Value Ref Range   Glucose-Capillary 102 (H) 70 - 99 mg/dL    Comment: Glucose reference range applies only to samples taken after fasting for at least 8 hours.  Glucose, capillary     Status: Abnormal   Collection Time: 12/08/20  9:44 PM  Result Value Ref Range   Glucose-Capillary 127 (H) 70 - 99 mg/dL    Comment: Glucose reference range applies only to samples taken after fasting for at least 8 hours.  Basic metabolic panel     Status: Abnormal   Collection Time: 12/09/20  6:01 AM  Result Value Ref Range   Sodium 140 135 - 145 mmol/L   Potassium 3.8 3.5 - 5.1 mmol/L   Chloride 104 98 - 111 mmol/L   CO2 28 22 - 32 mmol/L   Glucose, Bld 113 (H) 70 -  99 mg/dL    Comment: Glucose reference range applies only to samples taken after fasting for at least 8 hours.   BUN <5 (L) 6 - 20 mg/dL   Creatinine, Ser 1.21 0.61 - 1.24 mg/dL   Calcium 9.1 8.9 - 62.4 mg/dL   GFR, Estimated >46 >95 mL/min    Comment: (NOTE) Calculated using the CKD-EPI Creatinine Equation (2021)    Anion gap 8 5 - 15    Comment: Performed at [facility], [address], [place], [place] [address]    Studies/Results: No results found.  Medications: I have reviewed the patient's current medications.  Assessment: Necrotizing pancreatitis with complex pancreatic pseudocyst and head, body and tail of pancreas  Plan: Continue full liquids diet, continue IV fluids, continue early aggressive mobilization, continue pain management and continue antiemetics If patient does not have a bowel movement by tomorrow and abdominal distention is  not any better, recommend follow-up abdominal x-ray in a.m.[name]  Kerin Salen, MD 12/09/2020, 1:15 PM

## 2020-12-09 NOTE — Plan of Care (Signed)
  Problem: Health Behavior/Discharge Planning: Goal: Ability to manage health-related needs will improve Outcome: Progressing   Problem: Activity: Goal: Risk for activity intolerance will decrease Outcome: Progressing   

## 2020-12-10 ENCOUNTER — Inpatient Hospital Stay (HOSPITAL_COMMUNITY): Payer: Medicaid Other

## 2020-12-10 LAB — GLUCOSE, CAPILLARY
Glucose-Capillary: 98 mg/dL (ref 70–99)
Glucose-Capillary: 116 mg/dL — ABNORMAL HIGH (ref 70–99)
Glucose-Capillary: 137 mg/dL — ABNORMAL HIGH (ref 70–99)
Glucose-Capillary: 122 mg/dL — ABNORMAL HIGH (ref 70–99)
Glucose-Capillary: 147 mg/dL — ABNORMAL HIGH (ref 70–99)
Glucose-Capillary: 113 mg/dL — ABNORMAL HIGH (ref 70–99)
Glucose-Capillary: 129 mg/dL — ABNORMAL HIGH (ref 70–99)
Glucose-Capillary: 120 mg/dL — ABNORMAL HIGH (ref 70–99)
Glucose-Capillary: 152 mg/dL — ABNORMAL HIGH (ref 70–99)
Glucose-Capillary: 123 mg/dL — ABNORMAL HIGH (ref 70–99)
Glucose-Capillary: 178 mg/dL — ABNORMAL HIGH (ref 70–99)

## 2020-12-10 LAB — BASIC METABOLIC PANEL
Anion gap: 6 (ref 5–15)
BUN: <5 mg/dL — ABNORMAL LOW (ref 6–20)
CO2: 29 mmol/L (ref 22–32)
Calcium: 8.7 mg/dL — ABNORMAL LOW (ref 8.9–10.3)
Chloride: 100 mmol/L (ref 98–111)
Creatinine, Ser: 0.68 mg/dL (ref 0.61–1.24)
GFR, Estimated: >60 mL/min (ref >60)
Glucose, Bld: 155 mg/dL — ABNORMAL HIGH (ref 70–99)
Potassium: 3.8 mmol/L (ref 3.5–5.1)
Sodium: 135 mmol/L (ref 135–145)

## 2020-12-10 LAB — MAGNESIUM: Magnesium: 1.5 mg/dL — ABNORMAL LOW (ref 1.7–2.4)

## 2020-12-10 MED ORDER — MAGNESIUM HYDROXIDE 400 MG/5ML PO SUSP
15.0000 mL | Freq: Every day | ORAL | Status: DC
  Administered 2020-12-10 – 2020-12-13 (×4): 15 mL via ORAL
  Filled 2020-12-10 (×4): qty 30

## 2020-12-10 NOTE — Progress Notes (Signed)
Nutrition Follow-up  DOCUMENTATION CODES:   Severe malnutrition in context of acute illness/injury  INTERVENTION:   -Ensure Plus PO TID, each provides 350 kcals and 13g protein (some Ensure Enlive are available on unit with 20g protein)  NUTRITION DIAGNOSIS:   Severe Malnutrition related to acute illness (Pancreatitis) as evidenced by energy intake < or equal to 50% for > or equal to 5 days, mild muscle depletion, percent weight loss.  Ongoing.  GOAL:   Patient will meet greater than or equal to 90% of their needs  Progressing.  MONITOR:   PO intake, Diet advancement, Supplement acceptance, Weight trends  ASSESSMENT:   60 y.o. male presented to the ED with LUQ pain and increased N/V/D. PMH of T2DM, EtOH abuse, and reoccurring pancreatitis. Pt has been hospitalized multiple times over the past several months with symptoms of pancreatitis.  10/2: CLD 10/3: FLD 10/4: Heart Healthy diet 10/5: CLD 10/7: FLD  Patient consumed 100% of breakfast this morning (grits, soup and juices). Accepting Ensure supplements. Noted that pt has been having episodes of vomiting. Having early satiety. No BM since 10/4. Per GI note, pt will have KUB today.  Admission weight: 267 lbs. Current weight: 275 lbs  Medications: Colace, Creon, Multivitamin with minerals daily, Miralax, Zofran  Labs reviewed:  CBGs: 113-152 Low Mg  Diet Order:   Diet Order             Diet full liquid Room service appropriate? Yes; Fluid consistency: Thin  Diet effective 0500                   EDUCATION NEEDS:   No education needs have been identified at this time  Skin:  Skin Assessment: Reviewed RN Assessment  Last BM:  12/02/20 - Type 7  Height:   Ht Readings from Last 1 Encounters:  12/01/20 6\' 1"  (1.854 m)    Weight:   Wt Readings from Last 1 Encounters:  12/10/20 125.1 kg    Ideal Body Weight:  83.6 kg  BMI:  Body mass index is 36.39 kg/m.  Estimated Nutritional Needs:    Kcal:  02/09/21  Protein:  115-130 grams  Fluid:  >/= 2.3 L   [id number], MS, RD, LDN Inpatient Clinical Dietitian Contact information available via Amion

## 2020-12-10 NOTE — Progress Notes (Signed)
Palliative Care consultation requested by hospitalist for symptom management. At this time the patient's pain and nausea are well managed with IV and oral medications. 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 I have discussed with hospitalist and recommended continuing current pain management plan. Challenge is that patient needs outpatient pain management and prescribing for chronic pain issues related to his cervical and lumbar multi-level radiculopathy-likely not a candidate for outpatient palliative care unless his condition deteriorates or his goals of care change in the context of his current admission. If his condition deteriorates and formal consultation is needed please place new order for services.  Anderson Malta, DO Palliative Medicine

## 2020-12-10 NOTE — Progress Notes (Signed)
Subjective: Patient states he had nausea and vomiting with full liquids, vomited x 2 yesterday per patient and one time this AM. Contains what he has been drinking per patient.   Gets full very quickly and had this prior to his pancreatitis.  Continues to have AB distension, no AB pain.  Has not had a bowel movement in 4 days but is passing flatus.  States he is walking the halls 20 mins 3 x a day, has not been this AM.   Objective: Vital signs in last 24 hours: Temp:  [97.5 F (36.4 C)-98.2 F (36.8 C)] 97.5 F (36.4 C) (10/10 0533) Pulse Rate:  [72-76] 76 (10/10 0533) Resp:  [15-18] 18 (10/10 0533) BP: (108-138)/(78-84) 126/78 (10/10 0533) SpO2:  [96 %-100 %] 100 % (10/10 0533) Weight:  [125.1 kg] 125.1 kg (10/10 0500) Weight change: -1.5 kg Last BM Date: 12/04/20  PE: Not in distress, able to speak in full sentences GENERAL: Does not appear to be in pain, appears comfortable  ABDOMEN: Slightly distended upper abdomen, hypoactive bowel sounds, nontender otherwise EXTREMITIES: No deformity  Lab Results: Results for orders placed or performed during the hospital encounter of 12/01/20 (from the past 48 hour(s))  Glucose, capillary     Status: Abnormal   Collection Time: 12/08/20  3:49 PM  Result Value Ref Range   Glucose-Capillary 102 (H) 70 - 99 mg/dL    Comment: Glucose reference range applies only to samples taken after fasting for at least 8 hours.  Glucose, capillary     Status: Abnormal   Collection Time: 12/08/20  9:44 PM  Result Value Ref Range   Glucose-Capillary 127 (H) 70 - 99 mg/dL    Comment: Glucose reference range applies only to samples taken after fasting for at least 8 hours.  Basic metabolic panel     Status: Abnormal   Collection Time: 12/09/20  6:01 AM  Result Value Ref Range   Sodium 140 135 - 145 mmol/L   Potassium 3.8 3.5 - 5.1 mmol/L   Chloride 104 98 - 111 mmol/L   CO2 28 22 - 32 mmol/L   Glucose, Bld 113 (H) 70 - 99 mg/dL    Comment: Glucose  reference range applies only to samples taken after fasting for at least 8 hours.   BUN <5 (L) 6 - 20 mg/dL   Creatinine, Ser 4.85 0.61 - 1.24 mg/dL   Calcium 9.1 8.9 - 46.2 mg/dL   GFR, Estimated >70 >35 mL/min    Comment: (NOTE) Calculated using the CKD-EPI Creatinine Equation (2021)    Anion gap 8 5 - 15    Comment: Performed at [facility], [address], [place], [place] [address]  Glucose, capillary     Status: Abnormal   Collection Time: 12/10/20  8:01 AM  Result Value Ref Range   Glucose-Capillary 120 (H) 70 - 99 mg/dL    Comment: Glucose reference range applies only to samples taken after fasting for at least 8 hours.    Studies/Results: No results found.  Medications: I have reviewed the patient's current medications.  Assessment: Necrotizing pancreatitis with complex pancreatic pseudocyst and head, body and tail of pancreas No fever, no AB pain GFR >60 Potassium 3.8 12/09/2020 Magnesium 1.6 on 12/05/2020  Ileus secondary to above  Plan: Continue full liquids diet, continue IV fluids, continue early aggressive mobilization, continue pain management and continue antiemetics  Patient has not had BM, will get KUB to evaluate today Will check magnesium and potassium today Continue  to maintain magnesium above 2 and potassium at 4-4.5.  Can add ducolax suppository pending Xray Continue on miralax twice daily   Doree Albee, PA-C 12/10/2020, 11:10 AM

## 2020-12-10 NOTE — Progress Notes (Signed)
PROGRESS NOTE  Bryce Mendoza RCV:[id number] DOB: 1960/06/30 DOA: 12/01/2020 PCP: Claiborne Rigg, NP  HPI/Recap of past 55 hours: 60 year old male with history of alcohol recovering history of DVT with multiple episodes of necrotizing pancreatitis and pseudocyst was admitted December 01, 2020 for abdominal pain CT scan revealed concern for necrotizing pancreatitis with pseudocyst MRCP showed necrotizing pancreatitis with complex pancreatic pseudocyst in the head tail and body of the pancreas.  His hospital course was complicated by development of ileus he has been managed conservatively with bowel rest  Subjective: December 07, 2020: Patient seen and examined at bedside he stated he is doing little better still has some pain and cannot keep anything down nausea and complaint has not had any bowel movement he feels his bowels is locked up.  Patient stated he has been trying to walk around  December 08, 2020: Patient seen and examined at bedside he stated that he still has pain and vomiting.  That he vomited this morning after breakfast.  He stated he is alternating the IV pain medicine and oral hydrocodone Percocet to try to get adequate or tolerable pain relief. Still has not had any bowel movement after her seen the patient and nurse texted me and stated that patient told her his pain has not been below 6/20 and he was asking for an extra dose of Dilaudid to make it 2 mg for 1 time to help reduce the pain  December 09, 2020 Patient seen and examined at bedside he still complaining of abdominal pain he said he threw up last night and that it was projectile but this morning he has managed to keep his applesauce or apple juice down  December 10, 2020: Patient seen and examined at bedside he stated that this morning he was able to take his juice but he threw up yesterday Still has not made a bowel movement but he is passing flatus/gas Patient encouraged to continue to keep ambulate and attempt to drink  by sipping at his liquid  Assessment/Plan: Principal Problem:   Chronic pancreatitis (HCC) Active Problems:   CIDP (chronic inflammatory demyelinating polyneuropathy) (HCC)   Necrotizing pancreatitis   DM type 2 (diabetes mellitus, type 2) (HCC)   Protein-calorie malnutrition, severe   Morbid obesity (HCC)  1.  Ileus Improving still not tolerating food Continue IV hydration  2.  Necrotizing pancreatitis recurrent Responding to conservative management Followed by [facility] Continue pain management Patient still not able to tolerate by mouth.  I will give him an extra dose of Dilaudid to make it due to of 2 mg today and then he will continue on his scheduled as needed dose  3.  History of DVT/PE continue Eliquis  4.  Type 2 diabetes mellitus continue sliding scale insulin Home meds held due to being n.p.o. and not tolerating food well  5.  Morbid obesity  6.  Protein malnutrition.  Patient is not able to tolerate p.o. yet due to nausea vomiting and abdominal pain  7.  Chronic inflammatory demyelinating polyneuropathy: CIDP Continue Pamelor  8.  Patient feels like his bowels is locked up I will start him on Colace gentle stool softener Patient has not made a bowel movement yet so I will add MiraLAX twice a day and one-time dose of senna p.o.  9.  His magnesium is low We will replete  Code Status: Full  Severity of Illness: The appropriate patient status for this patient is INPATIENT. Inpatient status is judged to be reasonable and necessary  in order to provide the required intensity of service to ensure the patient's safety. The patient's presenting symptoms, physical exam findings, and initial radiographic and laboratory data in the context of their chronic comorbidities is felt to place them at high risk for further clinical deterioration. Furthermore, it is not anticipated that the patient will be medically stable for discharge from the hospital within 2 midnights of  admission. The following factors support the patient status of inpatient.   " Still unable to tolerate p.o.  * I certify that at the point of admission it is my clinical judgment that the patient will require inpatient hospital care spanning beyond 2 midnights from the point of admission due to high intensity of service, high risk for further deterioration and high frequency of surveillance required.*   Family Communication: Patient  Disposition Plan: Home Status is: Inpatient   Dispo: The patient is from: Home              Anticipated d/c is to:               Anticipated d/c date is:               Patient currently not medically stable for discharge  Consultants: GI General surgery  Procedures: None  Antimicrobials: None  DVT prophylaxis: Eliquis   Objective: Vitals:   12/09/20 2146 12/10/20 0500 12/10/20 0533 12/10/20 1342  BP: 138/83  126/78 108/75  Pulse: 73  76 70  Resp: 18  18 16   Temp: 98.2 F (36.8 C)  (!) 97.5 F (36.4 C) 98.2 F (36.8 C)  TempSrc: Oral  Oral Oral  SpO2: 99%  100% 96%  Weight:  125.1 kg    Height:        Intake/Output Summary (Last 24 hours) at 12/10/2020 1839 Last data filed at 12/10/2020 1029 Gross per 24 hour  Intake 1316.23 ml  Output 900 ml  Net 416.23 ml    Filed Weights   12/08/20 0500 12/09/20 0442 12/10/20 0500  Weight: 126 kg 126.6 kg 125.1 kg   Body mass index is 36.39 kg/m.  Exam:  General: 60 y.o. year-old male well developed well nourished in no acute distress.  Alert and oriented x3. Cardiovascular: Regular rate and rhythm with no rubs or gallops.  No thyromegaly or JVD noted.   Respiratory: Clear to auscultation with no wheezes or rales. Good inspiratory effort. Abdomen: Soft nontender nondistended with normal bowel sounds x4 quadrants. Musculoskeletal: No lower extremity edema. 2/4 pulses in all 4 extremities. Skin: No ulcerative lesions noted or rashes, Psychiatry: Mood is appropriate for condition and  setting Neurology:    Data Reviewed: CBC: Recent Labs  Lab 12/04/20 0530 12/05/20 0535  WBC 6.5 7.8  NEUTROABS  --  4.6  HGB 13.3 14.5  HCT 40.2 43.7  MCV 95.5 95.2  PLT 157 145*    Basic Metabolic Panel: Recent Labs  Lab 12/04/20 0530 12/05/20 0535 12/07/20 0537 12/08/20 0623 12/09/20 0601 12/10/20 1224  NA 135 135 139 144 140 135  K 4.3 4.1 3.5 4.0 3.8 3.8  CL 109 105 108 106 104 100  CO2 23 21* 26 29 28 29   GLUCOSE 153* 115* 114* 124* 113* 155*  BUN <5* <5* <5* <5* <5* <5*  CREATININE 0.67 0.61 0.70 0.81 0.71 0.68  CALCIUM 8.3* 8.3* 8.7* 9.2 9.1 8.7*  MG 1.9 1.6*  --   --   --  1.5*    GFR: Estimated Creatinine Clearance: 139.5 mL/min (by  C-G formula based on SCr of 0.68 mg/dL). Liver Function Tests: No results for input(s): AST, ALT, ALKPHOS, BILITOT, PROT, ALBUMIN in the last 168 hours.  Recent Labs  Lab 12/07/20 0537 12/08/20 0638  LIPASE 24 22    No results for input(s): AMMONIA in the last 168 hours. Coagulation Profile: No results for input(s): INR, PROTIME in the last 168 hours. Cardiac Enzymes: No results for input(s): CKTOTAL, CKMB, CKMBINDEX, TROPONINI in the last 168 hours. BNP (last 3 results) No results for input(s): PROBNP in the last 8760 hours. HbA1C: No results for input(s): HGBA1C in the last 72 hours. CBG: Recent Labs  Lab 12/09/20 1627 12/09/20 2150 12/10/20 0801 12/10/20 1112 12/10/20 1545  GLUCAP 113* 129* 120* 152* 123*    Lipid Profile: No results for input(s): CHOL, HDL, LDLCALC, TRIG, CHOLHDL, LDLDIRECT in the last 72 hours. Thyroid Function Tests: No results for input(s): TSH, T4TOTAL, FREET4, T3FREE, THYROIDAB in the last 72 hours. Anemia Panel: No results for input(s): VITAMINB12, FOLATE, FERRITIN, TIBC, IRON, RETICCTPCT in the last 72 hours. Urine analysis:    Component Value Date/Time   COLORURINE AMBER (A) 12/01/2020 2335   APPEARANCEUR HAZY (A) 12/01/2020 2335   LABSPEC 1.019 12/01/2020 2335    PHURINE 5.0 12/01/2020 2335   GLUCOSEU 50 (A) 12/01/2020 2335   HGBUR NEGATIVE 12/01/2020 2335   BILIRUBINUR NEGATIVE 12/01/2020 2335   KETONESUR 5 (A) 12/01/2020 2335   PROTEINUR 100 (A) 12/01/2020 2335   NITRITE NEGATIVE 12/01/2020 2335   LEUKOCYTESUR NEGATIVE 12/01/2020 2335   Sepsis Labs: @LABRCNTIP (procalcitonin:4,lacticidven:4)  ) Recent Results (from the past 240 hour(s))  Resp Panel by RT-PCR (Flu A&B, Covid) Nasopharyngeal Swab     Status: None   Collection Time: 12/02/20  2:31 AM   Specimen: Nasopharyngeal Swab; Nasopharyngeal(NP) swabs in vial transport medium  Result Value Ref Range Status   SARS Coronavirus 2 by RT PCR NEGATIVE NEGATIVE Final    Comment: (NOTE) SARS-CoV-2 target nucleic acids are NOT DETECTED.  The SARS-CoV-2 RNA is generally detectable in upper respiratory specimens during the acute phase of infection. The lowest concentration of SARS-CoV-2 viral copies this assay can detect is 138 copies/mL. A negative result does not preclude SARS-Cov-2 infection and should not be used as the sole basis for treatment or other patient management decisions. A negative result may occur with  improper specimen collection/handling, submission of specimen other than nasopharyngeal swab, presence of viral mutation(s) within the areas targeted by this assay, and inadequate number of viral copies(<138 copies/mL). A negative result must be combined with clinical observations, patient history, and epidemiological information. The expected result is Negative.  Fact Sheet for Patients:  02/01/21  Fact Sheet for Healthcare Providers:  BloggerCourse.com  This test is no t yet approved or cleared by the [employer] FDA and  has been authorized for detection and/or diagnosis of SARS-CoV-2 by FDA under an Emergency Use Authorization (EUA). This EUA will remain  in effect (meaning this test can be used) for the  duration of the COVID-19 declaration under Section 564(b)(1) of the Act, 21 U.S.C.section 360bbb-3(b)(1), unless the authorization is terminated  or revoked sooner.       Influenza A by PCR NEGATIVE NEGATIVE Final   Influenza B by PCR NEGATIVE NEGATIVE Final    Comment: (NOTE) The Xpert Xpress SARS-CoV-2/FLU/RSV plus assay is intended as an aid in the diagnosis of influenza from Nasopharyngeal swab specimens and should not be used as a sole basis for treatment. Nasal washings and aspirates are unacceptable  for Xpert Xpress SARS-CoV-2/FLU/RSV testing.  Fact Sheet for Patients: BloggerCourse.com  Fact Sheet for Healthcare Providers: [employer]ious[employer]ker[employer]it  This test is not yet approved or cleared by the [place] FDA and has been authorized for detection and/or diagnosis of SARS-CoV-2 by FDA under an Emergency Use Authorization (EUA). This EUA will remain in effect (meaning this test can be used) for the duration of the COVID-19 declaration under Section 564(b)(1) of the Act, 21 U.S.C. section 360bbb-3(b)(1), unless the authorization is terminated or revoked.  Performed at [facility], [address], [place], [place] [address]       Studies: DG Abd 1 View  Result Date: 12/10/2020 CLINICAL DATA:  Ileus.  Lower abdominal pain and vomiting. EXAM: ABDOMEN - 1 VIEW COMPARISON:  12/06/2020 FINDINGS: There is mild dilatation of multiple small bowel loops throughout the central abdomen which has increased from the prior study. Gas is present in the colon and rectum. A small left pleural effusion is suspected. No acute osseous abnormality is seen. IMPRESSION: Mildly increased small bowel dilatation which could reflect ileus or partial obstruction. Electronically Signed   By: Sebastian Ache M.D.   On: 12/10/2020 12:30    Scheduled Meds:  apixaban  5 mg Oral BID   atorvastatin  40 mg Oral Daily   dicyclomine  10 mg Oral  TID AC   docusate sodium  100 mg Oral BID   feeding supplement  237 mL Oral TID BM   insulin aspart  0-15 Units Subcutaneous TID WC   lipase/protease/amylase  24,000 Units Oral TID AC   multivitamin with minerals  1 tablet Oral Daily   nortriptyline  30 mg Oral BID   pantoprazole  40 mg Oral Daily   polyethylene glycol  17 g Oral BID   simethicone  80 mg Oral QID    Continuous Infusions:  sodium chloride 100 mL/hr at 12/10/20 1219     LOS: 7 days     Myrtie Neither, MD [facility]  To reach me or the doctor on call, go to: [email or web address] Password [facility]  12/10/2020, 6:39 PM

## 2020-12-11 LAB — MAGNESIUM
Magnesium: 1.6 mg/dL — ABNORMAL LOW (ref 1.7–2.4)
Magnesium: 1.6 mg/dL — ABNORMAL LOW (ref 1.7–2.4)

## 2020-12-11 LAB — GLUCOSE, CAPILLARY
Glucose-Capillary: 132 mg/dL — ABNORMAL HIGH (ref 70–99)
Glucose-Capillary: 94 mg/dL (ref 70–99)
Glucose-Capillary: 96 mg/dL (ref 70–99)
Glucose-Capillary: 152 mg/dL — ABNORMAL HIGH (ref 70–99)

## 2020-12-11 MED ORDER — BISACODYL 10 MG RE SUPP
10.0000 mg | Freq: Once | RECTAL | Status: AC
  Administered 2020-12-11: 10 mg via RECTAL
  Filled 2020-12-11: qty 1

## 2020-12-11 MED ORDER — MAGNESIUM OXIDE -MG SUPPLEMENT 400 (240 MG) MG PO TABS
800.0000 mg | ORAL_TABLET | Freq: Once | ORAL | Status: AC
  Administered 2020-12-11: 800 mg via ORAL
  Filled 2020-12-11: qty 2

## 2020-12-11 NOTE — Progress Notes (Addendum)
PROGRESS NOTE  Bryce Mendoza WCB:[id number] DOB: 1961-03-01 DOA: 12/01/2020 PCP: Claiborne Rigg, NP  HPI/Recap of past 73 hours: 60 year old male with history of alcohol recovering history of DVT with multiple episodes of necrotizing pancreatitis and pseudocyst was admitted December 01, 2020 for abdominal pain CT scan revealed concern for necrotizing pancreatitis with pseudocyst MRCP showed necrotizing pancreatitis with complex pancreatic pseudocyst in the head tail and body of the pancreas.  His hospital course was complicated by development of ileus he has been managed conservatively with bowel rest  Subjective: December 07, 2020: Patient seen and examined at bedside he stated he is doing little better still has some pain and cannot keep anything down nausea and complaint has not had any bowel movement he feels his bowels is locked up.  Patient stated he has been trying to walk around  December 08, 2020: Patient seen and examined at bedside he stated that he still has pain and vomiting.  That he vomited this morning after breakfast.  He stated he is alternating the IV pain medicine and oral hydrocodone Percocet to try to get adequate or tolerable pain relief. Still has not had any bowel movement after her seen the patient and nurse texted me and stated that patient told her his pain has not been below 6/20 and he was asking for an extra dose of Dilaudid to make it 2 mg for 1 time to help reduce the pain  December 09, 2020 Patient seen and examined at bedside he still complaining of abdominal pain he said he threw up last night and that it was projectile but this morning he has managed to keep his applesauce or apple juice down  December 10, 2020: Patient seen and examined at bedside he stated that this morning he was able to take his juice but he threw up yesterday Still has not made a bowel movement but he is passing flatus/gas Patient encouraged to continue to keep ambulate and attempt to drink  by sipping at his liquid  December 11, 2020: Patient seen and examined at bedside He finally made his bowel movement which she said was quite a good amount GI stated he could start on soft food but he still wants to go slowly and stay on his full liquid until later in the day he said    Assessment/Plan: Principal Problem:   Chronic pancreatitis (HCC) Active Problems:   CIDP (chronic inflammatory demyelinating polyneuropathy) (HCC)   Necrotizing pancreatitis   DM type 2 (diabetes mellitus, type 2) (HCC)   Protein-calorie malnutrition, severe   Morbid obesity (HCC)  1.  Ileus Improving still not tolerating food Continue IV hydration  2.  Necrotizing pancreatitis recurrent Responding to conservative management Followed by [facility] Continue pain management Patient still not able to tolerate by mouth.  I will give him an extra dose of Dilaudid to make it due to of 2 mg today and then he will continue on his scheduled as needed dose  3.  History of DVT/PE continue Eliquis  4.  Type 2 diabetes mellitus continue sliding scale insulin Home meds held due to being n.p.o. and not tolerating food well  5.  Morbid obesity  6.  Protein malnutrition.  Patient is not able to tolerate p.o. yet due to nausea vomiting and abdominal pain  7.  Chronic inflammatory demyelinating polyneuropathy: CIDP Continue Pamelor  8.  Patient feels like his bowels is locked up I will start him on Colace gentle stool softener Patient has not made  a bowel movement yet so I will add MiraLAX twice a day and one-time dose of senna p.o.  9.  His magnesium is low We will replete  Code Status: Full  Severity of Illness: The appropriate patient status for this patient is INPATIENT. Inpatient status is judged to be reasonable and necessary in order to provide the required intensity of service to ensure the patient's safety. The patient's presenting symptoms, physical exam findings, and initial radiographic and  laboratory data in the context of their chronic comorbidities is felt to place them at high risk for further clinical deterioration. Furthermore, it is not anticipated that the patient will be medically stable for discharge from the hospital within 2 midnights of admission. The following factors support the patient status of inpatient.   " Still unable to tolerate p.o.  * I certify that at the point of admission it is my clinical judgment that the patient will require inpatient hospital care spanning beyond 2 midnights from the point of admission due to high intensity of service, high risk for further deterioration and high frequency of surveillance required.*   Family Communication: Patient  Disposition Plan: Home Status is: Inpatient   Dispo: The patient is from: Home              Anticipated d/c is to:               Anticipated d/c date is:               Patient currently not medically stable for discharge  Consultants: GI General surgery  Procedures: None  Antimicrobials: None  DVT prophylaxis: Eliquis   Objective: Vitals:   12/10/20 1342 12/10/20 2218 12/11/20 0425 12/11/20 0504  BP: 108/75 122/78  (!) 141/100  Pulse: 70 77  72  Resp: 16 19  20   Temp: 98.2 F (36.8 C) 97.8 F (36.6 C)  97.8 F (36.6 C)  TempSrc: Oral Oral  Oral  SpO2: 96% 96%  99%  Weight:   125.1 kg   Height:        Intake/Output Summary (Last 24 hours) at 12/11/2020 1218 Last data filed at 12/11/2020 0423 Gross per 24 hour  Intake 1040 ml  Output 300 ml  Net 740 ml    Filed Weights   12/09/20 0442 12/10/20 0500 12/11/20 0425  Weight: 126.6 kg 125.1 kg 125.1 kg   Body mass index is 36.39 kg/m.  Exam:  General: 60 y.o. year-old male well developed well nourished in no acute distress.  Alert and oriented x3. Cardiovascular: Regular rate and rhythm with no rubs or gallops.  No thyromegaly or JVD noted.   Respiratory: Clear to auscultation with no wheezes or rales. Good inspiratory  effort. Abdomen: Soft nontender nondistended with normal bowel sounds x4 quadrants. Musculoskeletal: No lower extremity edema. 2/4 pulses in all 4 extremities. Skin: No ulcerative lesions noted or rashes, Psychiatry: Mood is appropriate for condition and setting Neurology:    Data Reviewed: CBC: Recent Labs  Lab 12/05/20 0535  WBC 7.8  NEUTROABS 4.6  HGB 14.5  HCT 43.7  MCV 95.2  PLT 145*    Basic Metabolic Panel: Recent Labs  Lab 12/05/20 0535 12/07/20 0537 12/08/20 0623 12/09/20 0601 12/10/20 1224 12/11/20 0504  NA 135 139 144 140 135  --   K 4.1 3.5 4.0 3.8 3.8  --   CL 105 108 106 104 100  --   CO2 21* 26 29 28 29   --   GLUCOSE 115* 114*  124* 113* 155*  --   BUN <5* <5* <5* <5* <5*  --   CREATININE 0.61 0.70 0.81 0.71 0.68  --   CALCIUM 8.3* 8.7* 9.2 9.1 8.7*  --   MG 1.6*  --   --   --  1.5* 1.6*    GFR: Estimated Creatinine Clearance: 139.5 mL/min (by C-G formula based on SCr of 0.68 mg/dL). Liver Function Tests: No results for input(s): AST, ALT, ALKPHOS, BILITOT, PROT, ALBUMIN in the last 168 hours.  Recent Labs  Lab 12/07/20 0537 12/08/20 0638  LIPASE 24 22    No results for input(s): AMMONIA in the last 168 hours. Coagulation Profile: No results for input(s): INR, PROTIME in the last 168 hours. Cardiac Enzymes: No results for input(s): CKTOTAL, CKMB, CKMBINDEX, TROPONINI in the last 168 hours. BNP (last 3 results) No results for input(s): PROBNP in the last 8760 hours. HbA1C: No results for input(s): HGBA1C in the last 72 hours. CBG: Recent Labs  Lab 12/10/20 1112 12/10/20 1545 12/10/20 2219 12/11/20 0820 12/11/20 1147  GLUCAP 152* 123* 178* 132* 94    Lipid Profile: No results for input(s): CHOL, HDL, LDLCALC, TRIG, CHOLHDL, LDLDIRECT in the last 72 hours. Thyroid Function Tests: No results for input(s): TSH, T4TOTAL, FREET4, T3FREE, THYROIDAB in the last 72 hours. Anemia Panel: No results for input(s): VITAMINB12, FOLATE,  FERRITIN, TIBC, IRON, RETICCTPCT in the last 72 hours. Urine analysis:    Component Value Date/Time   COLORURINE AMBER (A) 12/01/2020 2335   APPEARANCEUR HAZY (A) 12/01/2020 2335   LABSPEC 1.019 12/01/2020 2335   PHURINE 5.0 12/01/2020 2335   GLUCOSEU 50 (A) 12/01/2020 2335   HGBUR NEGATIVE 12/01/2020 2335   BILIRUBINUR NEGATIVE 12/01/2020 2335   KETONESUR 5 (A) 12/01/2020 2335   PROTEINUR 100 (A) 12/01/2020 2335   NITRITE NEGATIVE 12/01/2020 2335   LEUKOCYTESUR NEGATIVE 12/01/2020 2335   Sepsis Labs: @LABRCNTIP (procalcitonin:4,lacticidven:4)  ) Recent Results (from the past 240 hour(s))  Resp Panel by RT-PCR (Flu A&B, Covid) Nasopharyngeal Swab     Status: None   Collection Time: 12/02/20  2:31 AM   Specimen: Nasopharyngeal Swab; Nasopharyngeal(NP) swabs in vial transport medium  Result Value Ref Range Status   SARS Coronavirus 2 by RT PCR NEGATIVE NEGATIVE Final    Comment: (NOTE) SARS-CoV-2 target nucleic acids are NOT DETECTED.  The SARS-CoV-2 RNA is generally detectable in upper respiratory specimens during the acute phase of infection. The lowest concentration of SARS-CoV-2 viral copies this assay can detect is 138 copies/mL. A negative result does not preclude SARS-Cov-2 infection and should not be used as the sole basis for treatment or other patient management decisions. A negative result may occur with  improper specimen collection/handling, submission of specimen other than nasopharyngeal swab, presence of viral mutation(s) within the areas targeted by this assay, and inadequate number of viral copies(<138 copies/mL). A negative result must be combined with clinical observations, patient history, and epidemiological information. The expected result is Negative.  Fact Sheet for Patients:  02/01/21  Fact Sheet for Healthcare Providers:  BloggerCourse.com  This test is no t yet approved or cleared by the  [employer]ious[employer].[place] FDA and  has been authorized for detection and/or diagnosis of SARS-CoV-2 by FDA under an Emergency Use Authorization (EUA). This EUA will remain  in effect (meaning this test can be used) for the duration of the COVID-19 declaration under Section 564(b)(1) of the Act, 21 U.S.C.section 360bbb-3(b)(1), unless the authorization is terminated  or revoked sooner.  Influenza A by PCR NEGATIVE NEGATIVE Final   Influenza B by PCR NEGATIVE NEGATIVE Final    Comment: (NOTE) The Xpert Xpress SARS-CoV-2/FLU/RSV plus assay is intended as an aid in the diagnosis of influenza from Nasopharyngeal swab specimens and should not be used as a sole basis for treatment. Nasal washings and aspirates are unacceptable for Xpert Xpress SARS-CoV-2/FLU/RSV testing.  Fact Sheet for Patients: BloggerCourse.com  Fact Sheet for Healthcare Providers: SeriousBroker.it  This test is not yet approved or cleared by the [place] FDA and has been authorized for detection and/or diagnosis of SARS-CoV-2 by FDA under an Emergency Use Authorization (EUA). This EUA will remain in effect (meaning this test can be used) for the duration of the COVID-19 declaration under Section 564(b)(1) of the Act, 21 U.S.C. section 360bbb-3(b)(1), unless the authorization is terminated or revoked.  Performed at [facility], [address] [place], [place] [address]       Studies: No results found.  Scheduled Meds:  apixaban  5 mg Oral BID   atorvastatin  40 mg Oral Daily   dicyclomine  10 mg Oral TID AC   docusate sodium  100 mg Oral BID   feeding supplement  237 mL Oral TID BM   insulin aspart  0-15 Units Subcutaneous TID WC   lipase/protease/amylase  24,000 Units Oral TID AC   magnesium hydroxide  15 mL Oral QHS   magnesium oxide  800 mg Oral Once   multivitamin with minerals  1 tablet Oral Daily   nortriptyline  30 mg Oral BID    pantoprazole  40 mg Oral Daily   polyethylene glycol  17 g Oral BID   simethicone  80 mg Oral QID    Continuous Infusions:  sodium chloride 100 mL/hr at 12/10/20 2228     LOS: 8 days     Myrtie Neither, MD [facility]  To reach me or the doctor on call, go to: [email or web address] Password [email or web address]  12/11/2020, 12:18 PM

## 2020-12-11 NOTE — Progress Notes (Signed)
PROGRESS NOTE  Elman Dettman JJH:[id number] DOB: [birth year] DOA: 12/01/2020 PCP: Claiborne Rigg, NP  HPI/Recap of past 41 hours: 60 year old male with history of alcohol recovering history of DVT with multiple episodes of necrotizing pancreatitis and pseudocyst was admitted December 01, 2020 for abdominal pain CT scan revealed concern for necrotizing pancreatitis with pseudocyst MRCP showed necrotizing pancreatitis with complex pancreatic pseudocyst in the head tail and body of the pancreas.  His hospital course was complicated by development of ileus he has been managed conservatively with bowel rest  Subjective: December 07, 2020: Patient seen and examined at bedside he stated he is doing little better still has some pain and cannot keep anything down nausea and complaint has not had any bowel movement he feels his bowels is locked up.  Patient stated he has been trying to walk around  December 08, 2020: Patient seen and examined at bedside he stated that he still has pain and vomiting.  That he vomited this morning after breakfast.  He stated he is alternating the IV pain medicine and oral hydrocodone Percocet to try to get adequate or tolerable pain relief. Still has not had any bowel movement after her seen the patient and nurse texted me and stated that patient told her his pain has not been below 6/20 and he was asking for an extra dose of Dilaudid to make it 2 mg for 1 time to help reduce the pain  December 09, 2020 Patient seen and examined at bedside he still complaining of abdominal pain he said he threw up last night and that it was projectile but this morning he has managed to keep his applesauce or apple juice down  Assessment/Plan: Principal Problem:   Chronic pancreatitis (HCC) Active Problems:   CIDP (chronic inflammatory demyelinating polyneuropathy) (HCC)   Necrotizing pancreatitis   DM type 2 (diabetes mellitus, type 2) (HCC)   Protein-calorie malnutrition, severe   Morbid  obesity (HCC)  1.  Ileus Improving still not tolerating food Continue IV hydration  2.  Necrotizing pancreatitis recurrent Responding to conservative management Followed by [facility] Continue pain management Patient still not able to tolerate by mouth.  I will give him an extra dose of Dilaudid to make it due to of 2 mg today and then he will continue on his scheduled as needed dose  3.  History of DVT/PE continue Eliquis  4.  Type 2 diabetes mellitus continue sliding scale insulin Home meds held due to being n.p.o. and not tolerating food well  5.  Morbid obesity  6.  Protein malnutrition.  Patient is not able to tolerate p.o. yet due to nausea vomiting and abdominal pain  7.  Chronic inflammatory demyelinating polyneuropathy: CIDP Continue Pamelor  8.  Patient feels like his bowels is locked up I will start him on Colace gentle stool softener Patient has not made a bowel movement yet so I will add MiraLAX twice a day and one-time dose of senna p.o.  Code Status: Full  Severity of Illness: The appropriate patient status for this patient is INPATIENT. Inpatient status is judged to be reasonable and necessary in order to provide the required intensity of service to ensure the patient's safety. The patient's presenting symptoms, physical exam findings, and initial radiographic and laboratory data in the context of their chronic comorbidities is felt to place them at high risk for further clinical deterioration. Furthermore, it is not anticipated that the patient will be medically stable for discharge from the hospital within 2  midnights of admission. The following factors support the patient status of inpatient.   " Still unable to tolerate p.o.  * I certify that at the point of admission it is my clinical judgment that the patient will require inpatient hospital care spanning beyond 2 midnights from the point of admission due to high intensity of service, high risk for further  deterioration and high frequency of surveillance required.*   Family Communication: Patient  Disposition Plan: Home Status is: Inpatient   Dispo: The patient is from: Home              Anticipated d/c is to:               Anticipated d/c date is:               Patient currently not medically stable for discharge  Consultants: GI General surgery  Procedures: None  Antimicrobials: None  DVT prophylaxis: Eliquis   Objective: Vitals:   12/10/20 2218 12/11/20 0425 12/11/20 0504 12/11/20 1425  BP: 122/78  (!) 141/100 107/72  Pulse: 77  72 72  Resp: 19  20 15   Temp: 97.8 F (36.6 C)  97.8 F (36.6 C) 97.6 F (36.4 C)  TempSrc: Oral  Oral Oral  SpO2: 96%  99% 97%  Weight:  125.1 kg    Height:        Intake/Output Summary (Last 24 hours) at 12/11/2020 1702 Last data filed at 12/11/2020 0423 Gross per 24 hour  Intake 1040 ml  Output 300 ml  Net 740 ml    Filed Weights   12/09/20 0442 12/10/20 0500 12/11/20 0425  Weight: 126.6 kg 125.1 kg 125.1 kg   Body mass index is 36.39 kg/m.  Exam:  General: 60 y.o. year-old male well developed well nourished in no acute distress.  Alert and oriented x3. Cardiovascular: Regular rate and rhythm with no rubs or gallops.  No thyromegaly or JVD noted.   Respiratory: Clear to auscultation with no wheezes or rales. Good inspiratory effort. Abdomen: Firm tender ,nondistended with normal bowel sounds x4 quadrants. Musculoskeletal: No lower extremity edema. 2/4 pulses in all 4 extremities. Skin: No ulcerative lesions noted or rashes, Psychiatry: Mood is appropriate for condition and setting Neurology:    Data Reviewed: CBC: Recent Labs  Lab 12/05/20 0535  WBC 7.8  NEUTROABS 4.6  HGB 14.5  HCT 43.7  MCV 95.2  PLT 145*    Basic Metabolic Panel: Recent Labs  Lab 12/05/20 0535 12/07/20 0537 12/08/20 0623 12/09/20 0601 12/10/20 1224 12/11/20 0504 12/11/20 1245  NA 135 139 144 140 135  --   --   K 4.1 3.5 4.0  3.8 3.8  --   --   CL 105 108 106 104 100  --   --   CO2 21* 26 29 28 29   --   --   GLUCOSE 115* 114* 124* 113* 155*  --   --   BUN <5* <5* <5* <5* <5*  --   --   CREATININE 0.61 0.70 0.81 0.71 0.68  --   --   CALCIUM 8.3* 8.7* 9.2 9.1 8.7*  --   --   MG 1.6*  --   --   --  1.5* 1.6* 1.6*    GFR: Estimated Creatinine Clearance: 139.5 mL/min (by C-G formula based on SCr of 0.68 mg/dL). Liver Function Tests: No results for input(s): AST, ALT, ALKPHOS, BILITOT, PROT, ALBUMIN in the last 168 hours.  Recent Labs  Lab 12/07/20  2831 12/08/20 5176  LIPASE 24 22    No results for input(s): AMMONIA in the last 168 hours. Coagulation Profile: No results for input(s): INR, PROTIME in the last 168 hours. Cardiac Enzymes: No results for input(s): CKTOTAL, CKMB, CKMBINDEX, TROPONINI in the last 168 hours. BNP (last 3 results) No results for input(s): PROBNP in the last 8760 hours. HbA1C: No results for input(s): HGBA1C in the last 72 hours. CBG: Recent Labs  Lab 12/10/20 1545 12/10/20 2219 12/11/20 0820 12/11/20 1147 12/11/20 1644  GLUCAP 123* 178* 132* 94 96    Lipid Profile: No results for input(s): CHOL, HDL, LDLCALC, TRIG, CHOLHDL, LDLDIRECT in the last 72 hours. Thyroid Function Tests: No results for input(s): TSH, T4TOTAL, FREET4, T3FREE, THYROIDAB in the last 72 hours. Anemia Panel: No results for input(s): VITAMINB12, FOLATE, FERRITIN, TIBC, IRON, RETICCTPCT in the last 72 hours. Urine analysis:    Component Value Date/Time   COLORURINE AMBER (A) 12/01/2020 2335   APPEARANCEUR HAZY (A) 12/01/2020 2335   LABSPEC 1.019 12/01/2020 2335   PHURINE 5.0 12/01/2020 2335   GLUCOSEU 50 (A) 12/01/2020 2335   HGBUR NEGATIVE 12/01/2020 2335   BILIRUBINUR NEGATIVE 12/01/2020 2335   KETONESUR 5 (A) 12/01/2020 2335   PROTEINUR 100 (A) 12/01/2020 2335   NITRITE NEGATIVE 12/01/2020 2335   LEUKOCYTESUR NEGATIVE 12/01/2020 2335   Sepsis  Labs: @LABRCNTIP (procalcitonin:4,lacticidven:4)  ) Recent Results (from the past 240 hour(s))  Resp Panel by RT-PCR (Flu A&B, Covid) Nasopharyngeal Swab     Status: None   Collection Time: 12/02/20  2:31 AM   Specimen: Nasopharyngeal Swab; Nasopharyngeal(NP) swabs in vial transport medium  Result Value Ref Range Status   SARS Coronavirus 2 by RT PCR NEGATIVE NEGATIVE Final    Comment: (NOTE) SARS-CoV-2 target nucleic acids are NOT DETECTED.  The SARS-CoV-2 RNA is generally detectable in upper respiratory specimens during the acute phase of infection. The lowest concentration of SARS-CoV-2 viral copies this assay can detect is 138 copies/mL. A negative result does not preclude SARS-Cov-2 infection and should not be used as the sole basis for treatment or other patient management decisions. A negative result may occur with  improper specimen collection/handling, submission of specimen other than nasopharyngeal swab, presence of viral mutation(s) within the areas targeted by this assay, and inadequate number of viral copies(<138 copies/mL). A negative result must be combined with clinical observations, patient history, and epidemiological information. The expected result is Negative.  Fact Sheet for Patients:  02/01/21  Fact Sheet for Healthcare Providers:  BloggerCourse.com  This test is no t yet approved or cleared by the [employer] FDA and  has been authorized for detection and/or diagnosis of SARS-CoV-2 by FDA under an Emergency Use Authorization (EUA). This EUA will remain  in effect (meaning this test can be used) for the duration of the COVID-19 declaration under Section 564(b)(1) of the Act, 21 U.S.C.section 360bbb-3(b)(1), unless the authorization is terminated  or revoked sooner.       Influenza A by PCR NEGATIVE NEGATIVE Final   Influenza B by PCR NEGATIVE NEGATIVE Final    Comment: (NOTE) The Xpert  Xpress SARS-CoV-2/FLU/RSV plus assay is intended as an aid in the diagnosis of influenza from Nasopharyngeal swab specimens and should not be used as a sole basis for treatment. Nasal washings and aspirates are unacceptable for Xpert Xpress SARS-CoV-2/FLU/RSV testing.  Fact Sheet for Patients: [place]  Fact Sheet for Healthcare Providers: BloggerCourse.com  This test is not yet approved or cleared by the [employer] FDA  and has been authorized for detection and/or diagnosis of SARS-CoV-2 by FDA under an Emergency Use Authorization (EUA). This EUA will remain in effect (meaning this test can be used) for the duration of the COVID-19 declaration under Section 564(b)(1) of the Act, 21 U.S.C. section 360bbb-3(b)(1), unless the authorization is terminated or revoked.  Performed at [facility] [address] [place], [place] [address]       Studies: No results found.  Scheduled Meds:  apixaban  5 mg Oral BID   atorvastatin  40 mg Oral Daily   dicyclomine  10 mg Oral TID AC   docusate sodium  100 mg Oral BID   feeding supplement  237 mL Oral TID BM   insulin aspart  0-15 Units Subcutaneous TID WC   lipase/protease/amylase  24,000 Units Oral TID AC   magnesium hydroxide  15 mL Oral QHS   multivitamin with minerals  1 tablet Oral Daily   nortriptyline  30 mg Oral BID   pantoprazole  40 mg Oral Daily   polyethylene glycol  17 g Oral BID   simethicone  80 mg Oral QID    Continuous Infusions:  sodium chloride 100 mL/hr at 12/10/20 2228     LOS: 8 days     Myrtie Neither, MD [facility]  To reach me or the doctor on call, go to: [email or web address] Password [email or web address]  12/11/2020, 5:02 PM

## 2020-12-11 NOTE — Progress Notes (Signed)
Subjective: Episode of vomiting was yesterday, patient on full liquids diet tolerating well. Continues to have AB distension, mild epigastric abdominal pain.   Denies fever or chills. Has not had a bowel movement in 5 days but is passing flatus.  States he is walking the halls.   Objective: Vital signs in last 24 hours: Temp:  [97.8 F (36.6 C)-98.2 F (36.8 C)] 97.8 F (36.6 C) (10/11 0504) Pulse Rate:  [70-77] 72 (10/11 0504) Resp:  [16-20] 20 (10/11 0504) BP: (108-141)/(75-100) 141/100 (10/11 0504) SpO2:  [96 %-99 %] 99 % (10/11 0504) Weight:  [125.1 kg] 125.1 kg (10/11 0425) Weight change: 0 kg Last BM Date: 12/04/20  PE: Not in distress, able to speak in full sentences GENERAL: Does not appear to be in pain, appears comfortable  ABDOMEN: Slightly distended upper abdomen, hypoactive bowel sounds, mild epigastric tenderness EXTREMITIES: No deformity  Lab Results: Results for orders placed or performed during the hospital encounter of 12/01/20 (from the past 48 hour(s))  Glucose, capillary     Status: Abnormal   Collection Time: 12/09/20 11:48 AM  Result Value Ref Range   Glucose-Capillary 147 (H) 70 - 99 mg/dL    Comment: Glucose reference range applies only to samples taken after fasting for at least 8 hours.  Glucose, capillary     Status: Abnormal   Collection Time: 12/09/20  4:27 PM  Result Value Ref Range   Glucose-Capillary 113 (H) 70 - 99 mg/dL    Comment: Glucose reference range applies only to samples taken after fasting for at least 8 hours.  Glucose, capillary     Status: Abnormal   Collection Time: 12/09/20  9:50 PM  Result Value Ref Range   Glucose-Capillary 129 (H) 70 - 99 mg/dL    Comment: Glucose reference range applies only to samples taken after fasting for at least 8 hours.   Comment 1 Notify RN    Comment 2 Document in Chart   Glucose, capillary     Status: Abnormal   Collection Time: 12/10/20  8:01 AM  Result Value Ref Range   Glucose-Capillary  120 (H) 70 - 99 mg/dL    Comment: Glucose reference range applies only to samples taken after fasting for at least 8 hours.  Glucose, capillary     Status: Abnormal   Collection Time: 12/10/20 11:12 AM  Result Value Ref Range   Glucose-Capillary 152 (H) 70 - 99 mg/dL    Comment: Glucose reference range applies only to samples taken after fasting for at least 8 hours.  Magnesium     Status: Abnormal   Collection Time: 12/10/20 12:24 PM  Result Value Ref Range   Magnesium 1.5 (L) 1.7 - 2.4 mg/dL    Comment: Performed at [facility], [address] [place], [place] [address]  Basic metabolic panel     Status: Abnormal   Collection Time: 12/10/20 12:24 PM  Result Value Ref Range   Sodium 135 135 - 145 mmol/L   Potassium 3.8 3.5 - 5.1 mmol/L   Chloride 100 98 - 111 mmol/L   CO2 29 22 - 32 mmol/L   Glucose, Bld 155 (H) 70 - 99 mg/dL    Comment: Glucose reference range applies only to samples taken after fasting for at least 8 hours.   BUN <5 (L) 6 - 20 mg/dL   Creatinine, Ser 7.90 0.61 - 1.24 mg/dL   Calcium 8.7 (L) 8.9 - 10.3 mg/dL   GFR, Estimated >24 >09 mL/min  Comment: (NOTE) Calculated using the CKD-EPI Creatinine Equation (2021)    Anion gap 6 5 - 15    Comment: Performed at [facility], [address] [place], [place] [address]  Glucose, capillary     Status: Abnormal   Collection Time: 12/10/20  3:45 PM  Result Value Ref Range   Glucose-Capillary 123 (H) 70 - 99 mg/dL    Comment: Glucose reference range applies only to samples taken after fasting for at least 8 hours.  Glucose, capillary     Status: Abnormal   Collection Time: 12/10/20 10:19 PM  Result Value Ref Range   Glucose-Capillary 178 (H) 70 - 99 mg/dL    Comment: Glucose reference range applies only to samples taken after fasting for at least 8 hours.  Magnesium     Status: Abnormal   Collection Time: 12/11/20  5:04 AM  Result Value Ref Range   Magnesium 1.6 (L) 1.7 -  2.4 mg/dL    Comment: Performed at [facility], [address], [place], [place] [address]  Glucose, capillary     Status: Abnormal   Collection Time: 12/11/20  8:20 AM  Result Value Ref Range   Glucose-Capillary 132 (H) 70 - 99 mg/dL    Comment: Glucose reference range applies only to samples taken after fasting for at least 8 hours.    Studies/Results: DG Abd 1 View  Result Date: 12/10/2020 CLINICAL DATA:  Ileus.  Lower abdominal pain and vomiting. EXAM: ABDOMEN - 1 VIEW COMPARISON:  12/06/2020 FINDINGS: There is mild dilatation of multiple small bowel loops throughout the central abdomen which has increased from the prior study. Gas is present in the colon and rectum. A small left pleural effusion is suspected. No acute osseous abnormality is seen. IMPRESSION: Mildly increased small bowel dilatation which could reflect ileus or partial obstruction. Electronically Signed   By: Sebastian Ache M.D.   On: 12/10/2020 12:30    Medications: I have reviewed the patient's current medications.  Assessment: Necrotizing pancreatitis with complex pancreatic pseudocyst and head, body and tail of pancreas No fever, no AB pain GFR >60 Potassium 3.8 12/10/2020 Magnesium 1.6 on 12/11/2020  Ileus secondary to above KUB 12/10/2020  Mildly increased small bowel dilatation which could reflect ileus or partial obstruction.  Plan: Continue full liquids diet, can consider soft diet later today or tomorrow.  Continue IV fluids, continue early aggressive mobilization, continue pain management and continue antiemetics  Continue to maintain magnesium above 2 and potassium at 4-4.5.  Add ducolax suppository Continue on miralax twice daily Try to decrease opioid pain management if possible   Doree Albee, PA-C 12/11/2020, 9:03 AM

## 2020-12-12 ENCOUNTER — Ambulatory Visit: Payer: Medicaid Other | Admitting: Nurse Practitioner

## 2020-12-12 DIAGNOSIS — K567 Ileus, unspecified: Secondary | ICD-10-CM

## 2020-12-12 LAB — COMPREHENSIVE METABOLIC PANEL
ALT: 15 U/L (ref 0–44)
AST: 25 U/L (ref 15–41)
Albumin: 2.9 g/dL — ABNORMAL LOW (ref 3.5–5.0)
Alkaline Phosphatase: 89 U/L (ref 38–126)
Anion gap: 8 (ref 5–15)
BUN: 5 mg/dL — ABNORMAL LOW (ref 6–20)
CO2: 28 mmol/L (ref 22–32)
Calcium: 8.8 mg/dL — ABNORMAL LOW (ref 8.9–10.3)
Chloride: 102 mmol/L (ref 98–111)
Creatinine, Ser: 0.74 mg/dL (ref 0.61–1.24)
GFR, Estimated: >60 mL/min (ref >60)
Glucose, Bld: 154 mg/dL — ABNORMAL HIGH (ref 70–99)
Potassium: 4 mmol/L (ref 3.5–5.1)
Sodium: 138 mmol/L (ref 135–145)
Total Bilirubin: 0.5 mg/dL (ref 0.3–1.2)
Total Protein: 5.8 g/dL — ABNORMAL LOW (ref 6.5–8.1)

## 2020-12-12 LAB — GLUCOSE, CAPILLARY
Glucose-Capillary: 158 mg/dL — ABNORMAL HIGH (ref 70–99)
Glucose-Capillary: 123 mg/dL — ABNORMAL HIGH (ref 70–99)
Glucose-Capillary: 112 mg/dL — ABNORMAL HIGH (ref 70–99)
Glucose-Capillary: 172 mg/dL — ABNORMAL HIGH (ref 70–99)

## 2020-12-12 MED ORDER — HYDROMORPHONE HCL 1 MG/ML IJ SOLN
0.5000 mg | Freq: Four times a day (QID) | INTRAMUSCULAR | Status: DC | PRN
  Administered 2020-12-13 (×2): 0.5 mg via INTRAVENOUS
  Filled 2020-12-12 (×2): qty 0.5

## 2020-12-12 MED ORDER — BISACODYL 10 MG RE SUPP
10.0000 mg | Freq: Once | RECTAL | Status: AC
  Administered 2020-12-12: 10 mg via RECTAL
  Filled 2020-12-12: qty 1

## 2020-12-12 MED ORDER — METOCLOPRAMIDE HCL 5 MG/ML IJ SOLN
5.0000 mg | Freq: Four times a day (QID) | INTRAMUSCULAR | Status: DC
  Administered 2020-12-12 – 2020-12-14 (×8): 5 mg via INTRAVENOUS
  Filled 2020-12-12 (×8): qty 2

## 2020-12-12 NOTE — Progress Notes (Signed)
PROGRESS NOTE  Bryce Mendoza FEO:[id number] DOB: 07-27-1960 DOA: 12/01/2020 PCP: Claiborne Rigg, NP  HPI/Recap of past 19 hours: 60 year old male with history of alcohol recovering history of DVT with multiple episodes of necrotizing pancreatitis and pseudocyst was admitted December 01, 2020 for abdominal pain CT scan revealed concern for necrotizing pancreatitis with pseudocyst MRCP showed necrotizing pancreatitis with complex pancreatic pseudocyst in the head tail and body of the pancreas.  His hospital course was complicated by development of ileus he has been managed conservatively with bowel rest  Subjective: Reports having continued abdominal pain, worse after eating.  He has not had any vomiting, but does have nausea.  Pain medicines are helping the pain.  Assessment/Plan: Principal Problem:   Chronic pancreatitis (HCC) Active Problems:   CIDP (chronic inflammatory demyelinating polyneuropathy) (HCC)   Necrotizing pancreatitis   DM type 2 (diabetes mellitus, type 2) (HCC)   Protein-calorie malnutrition, severe   Morbid obesity (HCC)  1.  Ileus Appears to be having bowel movements -continue on miralax, suppository prn  2.  Necrotizing pancreatitis recurrent Responding to conservative management Followed by [facility] Continue pain management Patient currently on oxycodone and has been getting frequent doses of IV Dilaudid -Discussed expectations around pain management -I recommended that Dilaudid should be weaned off so as not to build continued opiate tolerance -He is agreeable to this -We will try and get patient set up with a pain management clinic since this appears to be the main barrier to keeping him out of the hospital -He reports that his primary care will only prescribe tramadol which would not adequately control his symptoms -He will need outpatient follow-up with GI for repeat CT scan in the next 1 to 2 months.  3.  History of DVT/PE continue Eliquis  4.  Type  2 diabetes mellitus continue sliding scale insulin Home meds held due to being n.p.o. and not tolerating food well  5.  Class II obesity, BMI 36.3  6.  Protein malnutrition.  Patient is not able to tolerate p.o. yet due to nausea vomiting and abdominal pain  7.  Chronic inflammatory demyelinating polyneuropathy: CIDP Continue Pamelor  8.  Constipation.  Last bowel movement yesterday after suppository.  He is on MiraLAX.  Requesting another suppository today.  Code Status: Full  Severity of Illness: The appropriate patient status for this patient is INPATIENT. Inpatient status is judged to be reasonable and necessary in order to provide the required intensity of service to ensure the patient's safety. The patient's presenting symptoms, physical exam findings, and initial radiographic and laboratory data in the context of their chronic comorbidities is felt to place them at high risk for further clinical deterioration. Furthermore, it is not anticipated that the patient will be medically stable for discharge from the hospital within 2 midnights of admission. The following factors support the patient status of inpatient.   " Pain management requiring IV pain meds  * I certify that at the point of admission it is my clinical judgment that the patient will require inpatient hospital care spanning beyond 2 midnights from the point of admission due to high intensity of service, high risk for further deterioration and high frequency of surveillance required.*   Family Communication: Patient  Disposition Plan: Home Status is: Inpatient   Dispo: The patient is from: Home              Anticipated d/c is to:  Anticipated d/c date is:               Patient currently not medically stable for discharge  Consultants: GI General surgery  Procedures: None  Antimicrobials: None  DVT prophylaxis: Eliquis   Objective: Vitals:   12/11/20 1425 12/11/20 2027 12/12/20 0432 12/12/20  1448  BP: 107/72 (!) 146/94 (!) 149/93 122/82  Pulse: 72 73 72 71  Resp: 15 14 14 18   Temp: 97.6 F (36.4 C) 98.3 F (36.8 C) 97.7 F (36.5 C) 98.1 F (36.7 C)  TempSrc: Oral Oral Oral Oral  SpO2: 97% 100% 98% 99%  Weight:   124.9 kg   Height:        Intake/Output Summary (Last 24 hours) at 12/12/2020 2025 Last data filed at 12/12/2020 1448 Gross per 24 hour  Intake 3866.24 ml  Output 1650 ml  Net 2216.24 ml   Filed Weights   12/10/20 0500 12/11/20 0425 12/12/20 0432  Weight: 125.1 kg 125.1 kg 124.9 kg   Body mass index is 36.33 kg/m.  Exam:  General: 60 y.o. year-old male well developed well nourished in no acute distress.  Alert and oriented x3. Cardiovascular: Regular rate and rhythm with no rubs or gallops.  No thyromegaly or JVD noted.   Respiratory: Clear to auscultation with no wheezes or rales. Good inspiratory effort. Abdomen: Firm tender ,nondistended with normal bowel sounds x4 quadrants. Musculoskeletal: No lower extremity edema. 2/4 pulses in all 4 extremities. Skin: No ulcerative lesions noted or rashes, Psychiatry: Mood is appropriate for condition and setting Neurology:    Data Reviewed: CBC: No results for input(s): WBC, NEUTROABS, HGB, HCT, MCV, PLT in the last 168 hours. Basic Metabolic Panel: Recent Labs  Lab 12/07/20 0537 12/08/20 0623 12/09/20 0601 12/10/20 1224 12/11/20 0504 12/11/20 1245 12/12/20 0525  NA 139 144 140 135  --   --  138  K 3.5 4.0 3.8 3.8  --   --  4.0  CL 108 106 104 100  --   --  102  CO2 26 29 28 29   --   --  28  GLUCOSE 114* 124* 113* 155*  --   --  154*  BUN <5* <5* <5* <5*  --   --  5*  CREATININE 0.70 0.81 0.71 0.68  --   --  0.74  CALCIUM 8.7* 9.2 9.1 8.7*  --   --  8.8*  MG  --   --   --  1.5* 1.6* 1.6*  --    GFR: Estimated Creatinine Clearance: 139.4 mL/min (by C-G formula based on SCr of 0.74 mg/dL). Liver Function Tests: Recent Labs  Lab 12/12/20 0525  AST 25  ALT 15  ALKPHOS 89  BILITOT 0.5   PROT 5.8*  ALBUMIN 2.9*   Recent Labs  Lab 12/07/20 0537 12/08/20 0638  LIPASE 24 22   No results for input(s): AMMONIA in the last 168 hours. Coagulation Profile: No results for input(s): INR, PROTIME in the last 168 hours. Cardiac Enzymes: No results for input(s): CKTOTAL, CKMB, CKMBINDEX, TROPONINI in the last 168 hours. BNP (last 3 results) No results for input(s): PROBNP in the last 8760 hours. HbA1C: No results for input(s): HGBA1C in the last 72 hours. CBG: Recent Labs  Lab 12/11/20 1644 12/11/20 2025 12/12/20 0730 12/12/20 1157 12/12/20 1733  GLUCAP 96 152* 158* 123* 112*   Lipid Profile: No results for input(s): CHOL, HDL, LDLCALC, TRIG, CHOLHDL, LDLDIRECT in the last 72 hours. Thyroid Function Tests: No results  for input(s): TSH, T4TOTAL, FREET4, T3FREE, THYROIDAB in the last 72 hours. Anemia Panel: No results for input(s): VITAMINB12, FOLATE, FERRITIN, TIBC, IRON, RETICCTPCT in the last 72 hours. Urine analysis:    Component Value Date/Time   COLORURINE AMBER (A) 12/01/2020 2335   APPEARANCEUR HAZY (A) 12/01/2020 2335   LABSPEC 1.019 12/01/2020 2335   PHURINE 5.0 12/01/2020 2335   GLUCOSEU 50 (A) 12/01/2020 2335   HGBUR NEGATIVE 12/01/2020 2335   BILIRUBINUR NEGATIVE 12/01/2020 2335   KETONESUR 5 (A) 12/01/2020 2335   PROTEINUR 100 (A) 12/01/2020 2335   NITRITE NEGATIVE 12/01/2020 2335   LEUKOCYTESUR NEGATIVE 12/01/2020 2335   Sepsis Labs: @LABRCNTIP (procalcitonin:4,lacticidven:4)  ) No results found for this or any previous visit (from the past 240 hour(s)).     Studies: No results found.  Scheduled Meds:  apixaban  5 mg Oral BID   atorvastatin  40 mg Oral Daily   bisacodyl  10 mg Rectal Once   dicyclomine  10 mg Oral TID AC   docusate sodium  100 mg Oral BID   feeding supplement  237 mL Oral TID BM   insulin aspart  0-15 Units Subcutaneous TID WC   lipase/protease/amylase  24,000 Units Oral TID AC   magnesium hydroxide  15 mL Oral  QHS   metoCLOPramide (REGLAN) injection  5 mg Intravenous Q6H   multivitamin with minerals  1 tablet Oral Daily   nortriptyline  30 mg Oral BID   pantoprazole  40 mg Oral Daily   polyethylene glycol  17 g Oral BID   simethicone  80 mg Oral QID    Continuous Infusions:  sodium chloride 100 mL/hr at 12/12/20 1428     LOS: 9 days     02/11/21, MD [facility]    12/12/2020, 8:25 PM

## 2020-12-12 NOTE — Progress Notes (Signed)
Subjective: Patient started on soft diet yesterday, denies vomiting. Has felt full very quickly with small amounts of food, some slight nausea. Continues to have AB distension, mild epigastric abdominal pain.   Denies fever or chills. Had 1 bowel movement yesterday 20 minutes after Dulcolax suppository, has been passing gas. Patient was walking the hall this morning when I was coming to see the patient.  Objective: Vital signs in last 24 hours: Temp:  [97.6 F (36.4 C)-98.3 F (36.8 C)] 97.7 F (36.5 C) (10/12 0432) Pulse Rate:  [72-73] 72 (10/12 0432) Resp:  [14-15] 14 (10/12 0432) BP: (107-149)/(72-94) 149/93 (10/12 0432) SpO2:  [97 %-100 %] 98 % (10/12 0432) Weight:  [124.9 kg] 124.9 kg (10/12 0432) Weight change: -0.2 kg Last BM Date: 12/11/20  PE: Not in distress, able to speak in full sentences GENERAL: Does not appear to be in pain, appears comfortable  ABDOMEN: Slightly distended upper abdomen, hypoactive bowel sounds, mild epigastric tenderness EXTREMITIES: No deformity  Lab Results: Results for orders placed or performed during the hospital encounter of 12/01/20 (from the past 48 hour(s))  Magnesium     Status: Abnormal   Collection Time: 12/10/20 12:24 PM  Result Value Ref Range   Magnesium 1.5 (L) 1.7 - 2.4 mg/dL    Comment: Performed at [facility], [address] [place], [place] [address]  Basic metabolic panel     Status: Abnormal   Collection Time: 12/10/20 12:24 PM  Result Value Ref Range   Sodium 135 135 - 145 mmol/L   Potassium 3.8 3.5 - 5.1 mmol/L   Chloride 100 98 - 111 mmol/L   CO2 29 22 - 32 mmol/L   Glucose, Bld 155 (H) 70 - 99 mg/dL    Comment: Glucose reference range applies only to samples taken after fasting for at least 8 hours.   BUN <5 (L) 6 - 20 mg/dL   Creatinine, Ser 6.21 0.61 - 1.24 mg/dL   Calcium 8.7 (L) 8.9 - 10.3 mg/dL   GFR, Estimated >30 >86 mL/min    Comment: (NOTE) Calculated using the CKD-EPI Creatinine  Equation (2021)    Anion gap 6 5 - 15    Comment: Performed at [facility], [address] [place], [place] [address]  Glucose, capillary     Status: Abnormal   Collection Time: 12/10/20  3:45 PM  Result Value Ref Range   Glucose-Capillary 123 (H) 70 - 99 mg/dL    Comment: Glucose reference range applies only to samples taken after fasting for at least 8 hours.  Glucose, capillary     Status: Abnormal   Collection Time: 12/10/20 10:19 PM  Result Value Ref Range   Glucose-Capillary 178 (H) 70 - 99 mg/dL    Comment: Glucose reference range applies only to samples taken after fasting for at least 8 hours.  Magnesium     Status: Abnormal   Collection Time: 12/11/20  5:04 AM  Result Value Ref Range   Magnesium 1.6 (L) 1.7 - 2.4 mg/dL    Comment: Performed at [facility], [address], [place], [place] [address]  Glucose, capillary     Status: Abnormal   Collection Time: 12/11/20  8:20 AM  Result Value Ref Range   Glucose-Capillary 132 (H) 70 - 99 mg/dL    Comment: Glucose reference range applies only to samples taken after fasting for at least 8 hours.  Glucose, capillary     Status: None   Collection Time: 12/11/20  11:47 AM  Result Value Ref Range   Glucose-Capillary 94 70 - 99 mg/dL    Comment: Glucose reference range applies only to samples taken after fasting for at least 8 hours.  Magnesium     Status: Abnormal   Collection Time: 12/11/20 12:45 PM  Result Value Ref Range   Magnesium 1.6 (L) 1.7 - 2.4 mg/dL    Comment: Performed at [facility], [address] [place], [place] [address]  Glucose, capillary     Status: None   Collection Time: 12/11/20  4:44 PM  Result Value Ref Range   Glucose-Capillary 96 70 - 99 mg/dL    Comment: Glucose reference range applies only to samples taken after fasting for at least 8 hours.   Comment 1 Notify RN    Comment 2 Document in Chart   Glucose, capillary     Status: Abnormal    Collection Time: 12/11/20  8:25 PM  Result Value Ref Range   Glucose-Capillary 152 (H) 70 - 99 mg/dL    Comment: Glucose reference range applies only to samples taken after fasting for at least 8 hours.  Comprehensive metabolic panel     Status: Abnormal   Collection Time: 12/12/20  5:25 AM  Result Value Ref Range   Sodium 138 135 - 145 mmol/L   Potassium 4.0 3.5 - 5.1 mmol/L   Chloride 102 98 - 111 mmol/L   CO2 28 22 - 32 mmol/L   Glucose, Bld 154 (H) 70 - 99 mg/dL    Comment: Glucose reference range applies only to samples taken after fasting for at least 8 hours.   BUN 5 (L) 6 - 20 mg/dL   Creatinine, Ser 8.41 0.61 - 1.24 mg/dL   Calcium 8.8 (L) 8.9 - 10.3 mg/dL   Total Protein 5.8 (L) 6.5 - 8.1 g/dL   Albumin 2.9 (L) 3.5 - 5.0 g/dL   AST 25 15 - 41 U/L   ALT 15 0 - 44 U/L   Alkaline Phosphatase 89 38 - 126 U/L   Total Bilirubin 0.5 0.3 - 1.2 mg/dL   GFR, Estimated >32 >44 mL/min    Comment: (NOTE) Calculated using the CKD-EPI Creatinine Equation (2021)    Anion gap 8 5 - 15    Comment: Performed at [facility], [address] [place], [place] [address]  Glucose, capillary     Status: Abnormal   Collection Time: 12/12/20  7:30 AM  Result Value Ref Range   Glucose-Capillary 158 (H) 70 - 99 mg/dL    Comment: Glucose reference range applies only to samples taken after fasting for at least 8 hours.    Studies/Results: DG Abd 1 View  Result Date: 12/10/2020 CLINICAL DATA:  Ileus.  Lower abdominal pain and vomiting. EXAM: ABDOMEN - 1 VIEW COMPARISON:  12/06/2020 FINDINGS: There is mild dilatation of multiple small bowel loops throughout the central abdomen which has increased from the prior study. Gas is present in the colon and rectum. A small left pleural effusion is suspected. No acute osseous abnormality is seen. IMPRESSION: Mildly increased small bowel dilatation which could reflect ileus or partial obstruction. Electronically Signed   By: Sebastian Ache  M.D.   On: 12/10/2020 12:30    Medications: I have reviewed the patient's current medications.  Assessment: Necrotizing pancreatitis with complex pancreatic pseudocyst and head, body and tail of pancreas No fever GFR >60 Calcium 8.8 Glucose 154 Potassium 4.0  Magnesium 1.6  Triglycerides 180  Ileus secondary to  above KUB 12/10/2020  Mildly increased small bowel dilatation which could reflect ileus or partial obstruction.  Plan: Continue full liquids diet, started on soft diet.  Continue IV fluids, continue early aggressive mobilization, continue pain management and continue antiemetics  Continue to maintain magnesium above 2 and potassium at 4-4.5.  Add ducolax suppository as needed Continue on miralax twice daily Try to decrease opioid pain management if possible Discussed gastroparesis diet with the patient   Doree Albee, PA-C 12/12/2020, 11:27 AM

## 2020-12-13 LAB — GLUCOSE, CAPILLARY
Glucose-Capillary: 122 mg/dL — ABNORMAL HIGH (ref 70–99)
Glucose-Capillary: 134 mg/dL — ABNORMAL HIGH (ref 70–99)
Glucose-Capillary: 111 mg/dL — ABNORMAL HIGH (ref 70–99)
Glucose-Capillary: 156 mg/dL — ABNORMAL HIGH (ref 70–99)

## 2020-12-13 LAB — CBC
HCT: 41.8 % (ref 39.0–52.0)
Hemoglobin: 13.8 g/dL (ref 13.0–17.0)
MCH: 31.1 pg (ref 26.0–34.0)
MCHC: 33 g/dL (ref 30.0–36.0)
MCV: 94.1 fL (ref 80.0–100.0)
Platelets: 151 10*3/uL (ref 150–400)
RBC: 4.44 MIL/uL (ref 4.22–5.81)
RDW: 14.3 % (ref 11.5–15.5)
WBC: 6.9 10*3/uL (ref 4.0–10.5)
nRBC: 0 % (ref 0.0–0.2)

## 2020-12-13 MED ORDER — METHOCARBAMOL 500 MG PO TABS
500.0000 mg | ORAL_TABLET | Freq: Four times a day (QID) | ORAL | Status: DC | PRN
  Administered 2020-12-13: 500 mg via ORAL
  Filled 2020-12-13: qty 1

## 2020-12-13 MED ORDER — MAGNESIUM SULFATE 4 GM/100ML IV SOLN
4.0000 g | Freq: Once | INTRAVENOUS | Status: AC
  Administered 2020-12-13: 4 g via INTRAVENOUS
  Filled 2020-12-13: qty 100

## 2020-12-13 MED ORDER — BISACODYL 10 MG RE SUPP: 10.0000 mg | Freq: Every day | RECTAL | Status: DC | PRN

## 2020-12-13 NOTE — Progress Notes (Signed)
PROGRESS NOTE  Bryce Mendoza IFO:[id number] DOB: 1961-02-12 DOA: 12/01/2020 PCP: Claiborne Rigg, NP  HPI/Recap of past 15 hours: 60 year old male with history of alcohol recovering history of DVT with multiple episodes of necrotizing pancreatitis and pseudocyst was admitted December 01, 2020 for abdominal pain CT scan revealed concern for necrotizing pancreatitis with pseudocyst MRCP showed necrotizing pancreatitis with complex pancreatic pseudocyst in the head tail and body of the pancreas.  His hospital course was complicated by development of ileus he has been managed conservatively with bowel rest  Subjective: He did have bowel movement last night. Nausea improving. Overall pain control appears to be improving. Patient is agreeable to discontinue further dilaudid and monitor.  Assessment/Plan: Principal Problem:   Chronic pancreatitis (HCC) Active Problems:   CIDP (chronic inflammatory demyelinating polyneuropathy) (HCC)   Necrotizing pancreatitis   DM type 2 (diabetes mellitus, type 2) (HCC)   Protein-calorie malnutrition, severe   Morbid obesity (HCC)  1.  Ileus Appears to be having bowel movements -continue on miralax, suppository prn  2.  Necrotizing pancreatitis, recurrent Responding to conservative management Followed by [facility] Continue pain management Patient currently on oxycodone and has been getting intermittent doses of IV Dilaudid -Discussed expectations around pain management -I recommended that Dilaudid should be weaned off so as not to build continued opiate tolerance and patient is agreeable -will plan on discontinuing dilaudid today -We will try and get patient set up with a pain management clinic since this appears to be the main barrier to keeping him out of the hospital -he has had recurrent admissions to the hospital for uncontrolled pain -He reports that his primary care will only prescribe tramadol which would not adequately control his symptoms -I  have reached out to [facility] in [place] to see if they are able to see him. He may benefit from celiac nerve block? -He will need outpatient follow-up with GI for repeat CT scan in the next 1 to 2 months. -unfortunately, due to insurance barriers, he likely will not be able to have home visiting nurse upon discharge.  3.  History of DVT/PE continue Eliquis  4.  Type 2 diabetes mellitus continue sliding scale insulin -blood sugars  have been stable  5.  Class II obesity, BMI 36.3  6.  Protein malnutrition.  Appears to be tolerating po intake   7.  Chronic inflammatory demyelinating polyneuropathy: CIDP Continue Pamelor  8.  Constipation.  Last bowel movement yesterday after suppository.  He is on MiraLAX.  Will continue suppository prn  Code Status: Full  Severity of Illness: The appropriate patient status for this patient is INPATIENT. Inpatient status is judged to be reasonable and necessary in order to provide the required intensity of service to ensure the patient's safety. The patient's presenting symptoms, physical exam findings, and initial radiographic and laboratory data in the context of their chronic comorbidities is felt to place them at high risk for further clinical deterioration. Furthermore, it is not anticipated that the patient will be medically stable for discharge from the hospital within 2 midnights of admission. The following factors support the patient status of inpatient.   " Pain management requiring IV pain meds  * I certify that at the point of admission it is my clinical judgment that the patient will require inpatient hospital care spanning beyond 2 midnights from the point of admission due to high intensity of service, high risk for further deterioration and high frequency of surveillance required.*   Family Communication:  Patient  Disposition Plan: Home Status is: Inpatient   Dispo: The patient is from: Home              Anticipated  d/c is to: home              Anticipated d/c date is:               Patient currently not medically stable for discharge  Consultants: GI General surgery  Procedures: None  Antimicrobials: None  DVT prophylaxis: Eliquis   Objective: Vitals:   12/12/20 0432 12/12/20 1448 12/12/20 2028 12/13/20 0502  BP: (!) 149/93 122/82 (!) 148/94 (!) 156/97  Pulse: 72 71 74 70  Resp: 14 18 14 14   Temp: 97.7 F (36.5 C) 98.1 F (36.7 C) 98.3 F (36.8 C) 97.7 F (36.5 C)  TempSrc: Oral Oral Oral Oral  SpO2: 98% 99% 97% 99%  Weight: 124.9 kg   124.8 kg  Height:        Intake/Output Summary (Last 24 hours) at 12/13/2020 1104 Last data filed at 12/13/2020 0953 Gross per 24 hour  Intake 3866.24 ml  Output 150 ml  Net 3716.24 ml   Filed Weights   12/11/20 0425 12/12/20 0432 12/13/20 0502  Weight: 125.1 kg 124.9 kg 124.8 kg   Body mass index is 36.3 kg/m.  Exam:  General exam: Alert, awake, oriented x 3 Respiratory system: Clear to auscultation. Respiratory effort normal. Cardiovascular system:RRR. No murmurs, rubs, gallops. Gastrointestinal system: Abdomen is nondistended, soft and tender in epigastrium. No organomegaly or masses felt. Normal bowel sounds heard. Central nervous system: Alert and oriented. No focal neurological deficits. Extremities: No C/C/E, +pedal pulses Skin: No rashes, lesions or ulcers Psychiatry: Judgement and insight appear normal. Mood & affect appropriate.      Data Reviewed: CBC: Recent Labs  Lab 12/13/20 0458  WBC 6.9  HGB 13.8  HCT 41.8  MCV 94.1  PLT 151   Basic Metabolic Panel: Recent Labs  Lab 12/07/20 0537 12/08/20 0623 12/09/20 0601 12/10/20 1224 12/11/20 0504 12/11/20 1245 12/12/20 0525  NA 139 144 140 135  --   --  138  K 3.5 4.0 3.8 3.8  --   --  4.0  CL 108 106 104 100  --   --  102  CO2 26 29 28 29   --   --  28  GLUCOSE 114* 124* 113* 155*  --   --  154*  BUN <5* <5* <5* <5*  --   --  5*  CREATININE 0.70 0.81  0.71 0.68  --   --  0.74  CALCIUM 8.7* 9.2 9.1 8.7*  --   --  8.8*  MG  --   --   --  1.5* 1.6* 1.6*  --    GFR: Estimated Creatinine Clearance: 139.4 mL/min (by C-G formula based on SCr of 0.74 mg/dL). Liver Function Tests: Recent Labs  Lab 12/12/20 0525  AST 25  ALT 15  ALKPHOS 89  BILITOT 0.5  PROT 5.8*  ALBUMIN 2.9*   Recent Labs  Lab 12/07/20 0537 12/08/20 0638  LIPASE 24 22   No results for input(s): AMMONIA in the last 168 hours. Coagulation Profile: No results for input(s): INR, PROTIME in the last 168 hours. Cardiac Enzymes: No results for input(s): CKTOTAL, CKMB, CKMBINDEX, TROPONINI in the last 168 hours. BNP (last 3 results) No results for input(s): PROBNP in the last 8760 hours. HbA1C: No results for input(s): HGBA1C in the last 72 hours. CBG:  Recent Labs  Lab 12/12/20 0730 12/12/20 1157 12/12/20 1733 12/12/20 2029 12/13/20 0805  GLUCAP 158* 123* 112* 172* 122*   Lipid Profile: No results for input(s): CHOL, HDL, LDLCALC, TRIG, CHOLHDL, LDLDIRECT in the last 72 hours. Thyroid Function Tests: No results for input(s): TSH, T4TOTAL, FREET4, T3FREE, THYROIDAB in the last 72 hours. Anemia Panel: No results for input(s): VITAMINB12, FOLATE, FERRITIN, TIBC, IRON, RETICCTPCT in the last 72 hours. Urine analysis:    Component Value Date/Time   COLORURINE AMBER (A) 12/01/2020 2335   APPEARANCEUR HAZY (A) 12/01/2020 2335   LABSPEC 1.019 12/01/2020 2335   PHURINE 5.0 12/01/2020 2335   GLUCOSEU 50 (A) 12/01/2020 2335   HGBUR NEGATIVE 12/01/2020 2335   BILIRUBINUR NEGATIVE 12/01/2020 2335   KETONESUR 5 (A) 12/01/2020 2335   PROTEINUR 100 (A) 12/01/2020 2335   NITRITE NEGATIVE 12/01/2020 2335   LEUKOCYTESUR NEGATIVE 12/01/2020 2335   Sepsis Labs: @LABRCNTIP (procalcitonin:4,lacticidven:4)  ) No results found for this or any previous visit (from the past 240 hour(s)).     Studies: No results found.  Scheduled Meds:  apixaban  5 mg Oral BID    atorvastatin  40 mg Oral Daily   dicyclomine  10 mg Oral TID AC   docusate sodium  100 mg Oral BID   feeding supplement  237 mL Oral TID BM   insulin aspart  0-15 Units Subcutaneous TID WC   lipase/protease/amylase  24,000 Units Oral TID AC   magnesium hydroxide  15 mL Oral QHS   metoCLOPramide (REGLAN) injection  5 mg Intravenous Q6H   multivitamin with minerals  1 tablet Oral Daily   nortriptyline  30 mg Oral BID   pantoprazole  40 mg Oral Daily   polyethylene glycol  17 g Oral BID   simethicone  80 mg Oral QID    Continuous Infusions:  sodium chloride 100 mL/hr at 12/13/20 0937     LOS: 10 days     12/15/20, MD [facility]    12/13/2020, 11:04 AM

## 2020-12-13 NOTE — Progress Notes (Signed)
Subjective: Patient started on soft diet yesterday, denies vomiting, nausea, doing well.  Less bloating/distension today but continued AB pain.  Denies fever or chills. Had 1 bowel movement yesterday 20 minutes after Dulcolax suppository, has been passing gas. Patient was walking the halls.  Objective: Vital signs in last 24 hours: Temp:  [97.7 F (36.5 C)-98.3 F (36.8 C)] 97.7 F (36.5 C) (10/13 0502) Pulse Rate:  [70-74] 70 (10/13 0502) Resp:  [14-18] 14 (10/13 0502) BP: (122-156)/(82-97) 156/97 (10/13 0502) SpO2:  [97 %-99 %] 99 % (10/13 0502) Weight:  [124.8 kg] 124.8 kg (10/13 0502) Weight change: -0.1 kg Last BM Date: 12/11/20  PE: Not in distress, able to speak in full sentences GENERAL: Does not appear to be in pain, appears comfortable  ABDOMEN: Normal BS, nondistended,  EXTREMITIES: No deformity  Lab Results: Results for orders placed or performed during the hospital encounter of 12/01/20 (from the past 48 hour(s))  Glucose, capillary     Status: None   Collection Time: 12/11/20 11:47 AM  Result Value Ref Range   Glucose-Capillary 94 70 - 99 mg/dL    Comment: Glucose reference range applies only to samples taken after fasting for at least 8 hours.  Magnesium     Status: Abnormal   Collection Time: 12/11/20 12:45 PM  Result Value Ref Range   Magnesium 1.6 (L) 1.7 - 2.4 mg/dL    Comment: Performed at [facility], [address], [place], [place] [address]  Glucose, capillary     Status: None   Collection Time: 12/11/20  4:44 PM  Result Value Ref Range   Glucose-Capillary 96 70 - 99 mg/dL    Comment: Glucose reference range applies only to samples taken after fasting for at least 8 hours.   Comment 1 Notify RN    Comment 2 Document in Chart   Glucose, capillary     Status: Abnormal   Collection Time: 12/11/20  8:25 PM  Result Value Ref Range   Glucose-Capillary 152 (H) 70 - 99 mg/dL    Comment: Glucose reference range applies only to  samples taken after fasting for at least 8 hours.  Comprehensive metabolic panel     Status: Abnormal   Collection Time: 12/12/20  5:25 AM  Result Value Ref Range   Sodium 138 135 - 145 mmol/L   Potassium 4.0 3.5 - 5.1 mmol/L   Chloride 102 98 - 111 mmol/L   CO2 28 22 - 32 mmol/L   Glucose, Bld 154 (H) 70 - 99 mg/dL    Comment: Glucose reference range applies only to samples taken after fasting for at least 8 hours.   BUN 5 (L) 6 - 20 mg/dL   Creatinine, Ser 7.82 0.61 - 1.24 mg/dL   Calcium 8.8 (L) 8.9 - 10.3 mg/dL   Total Protein 5.8 (L) 6.5 - 8.1 g/dL   Albumin 2.9 (L) 3.5 - 5.0 g/dL   AST 25 15 - 41 U/L   ALT 15 0 - 44 U/L   Alkaline Phosphatase 89 38 - 126 U/L   Total Bilirubin 0.5 0.3 - 1.2 mg/dL   GFR, Estimated >95 >62 mL/min    Comment: (NOTE) Calculated using the CKD-EPI Creatinine Equation (2021)    Anion gap 8 5 - 15    Comment: Performed at [facility], [address], [place], [place] [address]  Glucose, capillary     Status: Abnormal   Collection Time: 12/12/20  7:30 AM  Result Value Ref Range  Glucose-Capillary 158 (H) 70 - 99 mg/dL    Comment: Glucose reference range applies only to samples taken after fasting for at least 8 hours.  Glucose, capillary     Status: Abnormal   Collection Time: 12/12/20 11:57 AM  Result Value Ref Range   Glucose-Capillary 123 (H) 70 - 99 mg/dL    Comment: Glucose reference range applies only to samples taken after fasting for at least 8 hours.   Comment 1 Notify RN    Comment 2 Document in Chart   Glucose, capillary     Status: Abnormal   Collection Time: 12/12/20  5:33 PM  Result Value Ref Range   Glucose-Capillary 112 (H) 70 - 99 mg/dL    Comment: Glucose reference range applies only to samples taken after fasting for at least 8 hours.   Comment 1 Notify RN    Comment 2 Document in Chart   Glucose, capillary     Status: Abnormal   Collection Time: 12/12/20  8:29 PM  Result Value Ref Range    Glucose-Capillary 172 (H) 70 - 99 mg/dL    Comment: Glucose reference range applies only to samples taken after fasting for at least 8 hours.  CBC     Status: None   Collection Time: 12/13/20  4:58 AM  Result Value Ref Range   WBC 6.9 4.0 - 10.5 K/uL   RBC 4.44 4.22 - 5.81 MIL/uL   Hemoglobin 13.8 13.0 - 17.0 g/dL   HCT 81.8 29.9 - 37.1 %   MCV 94.1 80.0 - 100.0 fL   MCH 31.1 26.0 - 34.0 pg   MCHC 33.0 30.0 - 36.0 g/dL   RDW 69.6 78.9 - 38.1 %   Platelets 151 150 - 400 K/uL   nRBC 0.0 0.0 - 0.2 %    Comment: Performed at [facility], [address], [place], [place] [address]  Glucose, capillary     Status: Abnormal   Collection Time: 12/13/20  8:05 AM  Result Value Ref Range   Glucose-Capillary 122 (H) 70 - 99 mg/dL    Comment: Glucose reference range applies only to samples taken after fasting for at least 8 hours.    Studies/Results: No results found.  Medications: I have reviewed the patient's current medications.  Assessment: Necrotizing pancreatitis with complex pancreatic pseudocyst and head, body and tail of pancreas No fever GFR >60  Calcium 8.8 Glucose 154 Potassium 4.0  Magnesium 1.6  Triglycerides 180  Ileus secondary to above KUB 12/10/2020  Mildly increased small bowel dilatation which could reflect ileus or partial obstruction.  Plan: Continue soft diet Continue IV fluids, continue early aggressive mobilization, continue pain management.  He is currently walking well, tolerate soft diet well, has had bowel movements with miralax and ducolax as needed.  Can go home with follow up  Patient is most concerned for pain management out patient, may benefit from pain management referral.  Doree Albee, PA-C 12/13/2020, 10:35 AM

## 2020-12-14 ENCOUNTER — Ambulatory Visit: Payer: Medicaid Other | Admitting: Nurse Practitioner

## 2020-12-14 LAB — GLUCOSE, CAPILLARY
Glucose-Capillary: 134 mg/dL — ABNORMAL HIGH (ref 70–99)
Glucose-Capillary: 151 mg/dL — ABNORMAL HIGH (ref 70–99)

## 2020-12-14 MED ORDER — DICYCLOMINE HCL 10 MG PO CAPS: 10.0000 mg | ORAL_CAPSULE | Freq: Three times a day (TID) | ORAL | Status: DC | PRN

## 2020-12-14 MED ORDER — AZITHROMYCIN 1 % OP SOLN: 1.0000 [drp] | Freq: Two times a day (BID) | OPHTHALMIC | 0 refills | Status: DC

## 2020-12-14 MED ORDER — OXYCODONE HCL 10 MG PO TABS: 10.0000 mg | ORAL_TABLET | Freq: Four times a day (QID) | ORAL | 0 refills | Status: DC | PRN

## 2020-12-14 MED ORDER — POLYETHYLENE GLYCOL 3350 17 G PO PACK: 17.0000 g | PACK | Freq: Two times a day (BID) | ORAL | 0 refills | Status: DC

## 2020-12-14 MED ORDER — METOCLOPRAMIDE HCL 5 MG PO TABS: 5.0000 mg | ORAL_TABLET | Freq: Three times a day (TID) | ORAL | 1 refills | Status: DC | PRN

## 2020-12-14 MED ORDER — PANCRELIPASE (LIP-PROT-AMYL) 12000-38000 UNITS PO CPEP: 24000.0000 [IU] | ORAL_CAPSULE | Freq: Three times a day (TID) | ORAL | 1 refills | Status: DC

## 2020-12-14 MED ORDER — BISACODYL 10 MG RE SUPP: 10.0000 mg | Freq: Every day | RECTAL | 0 refills | Status: DC | PRN

## 2020-12-14 NOTE — Discharge Summary (Signed)
Physician Discharge Summary  Bryce Mendoza PQD:[id number] DOB: 1961/01/29 DOA: 12/01/2020  PCP: [name], NP  Admit date: 12/01/2020 Discharge date: 12/14/2020  Admitted From: Home Disposition: Home  Recommendations for Outpatient Follow-up:  Follow up with PCP in 1-2 weeks Patient will follow up with Dr. Paulita Fujita in the next 1 to 2 months for repeat CT imaging He has been set up with pain management, appointment is on 10/18  Discharge Condition: Stable CODE STATUS: Full code Diet recommendation: Low-fat  Brief/Interim Summary: 60 year old male with history of alcohol recovering, history of DVT with multiple episodes of necrotizing pancreatitis and pseudocyst was admitted December 01, 2020 for abdominal pain. CT scan revealed concern for necrotizing pancreatitis with pseudocyst MRCP showed necrotizing pancreatitis with complex pancreatic pseudocyst in the head tail and body of the pancreas.  His hospital course was complicated by development of ileus he has been managed conservatively with bowel rest  Discharge Diagnoses:  Principal Problem:   Chronic pancreatitis ([place]) Active Problems:   CIDP (chronic inflammatory demyelinating polyneuropathy) (HCC)   Necrotizing pancreatitis   DM type 2 (diabetes mellitus, type 2) (HCC)   Protein-calorie malnutrition, severe   Morbid obesity ([place])  1.  Ileus Appears to be having bowel movements -continue on miralax, suppository prn  2.  Necrotizing pancreatitis, recurrent Responding to conservative management Followed by [facility] Continue pain management Patient initially treated with oral oxycodone and IV Dilaudid for severe pain -Discussed expectations around pain management -IV Dilaudid was weaned off and pain appears to be reasonably controlled with oral oxycodone -We will try and get patient set up with a pain management clinic since this appears to be the main barrier to keeping him out of the hospital -he has had recurrent  admissions to the hospital for uncontrolled pain -He reports that his primary care will only prescribe tramadol which would not adequately control his symptoms -I have gotten him an appointment at [facility] for 10/18 -He will need outpatient follow-up with GI for repeat CT scan in the next 1 to 2 months. -unfortunately, due to insurance barriers, he likely will not be able to have home visiting nurse upon discharge.  3.  History of DVT/PE continue Eliquis  4.  Type 2 diabetes mellitus  -blood sugars  have been stable -Resume home regimen on discharge  5.  Class II obesity, BMI 36.3  6.  Protein malnutrition.  Appears to be tolerating po intake   7.  Chronic inflammatory demyelinating polyneuropathy: CIDP Continue Pamelor  8.  Constipation.  Last bowel movement yesterday after suppository.  He is on MiraLAX.  Will continue suppository prn  Discharge Instructions  Discharge Instructions     Ambulatory referral to Pain Clinic   Complete by: As directed    Recurrent admissions to the hospital for uncontrolled pain from chronic pancreatitis. Also following with Gastroenterology. Primary care physician unable to provide adequate pain control.   Diet - low sodium heart healthy   Complete by: As directed    Increase activity slowly   Complete by: As directed       Allergies as of 12/14/2020   No Known Allergies      Medication List     STOP taking these medications    sucralfate 1 g tablet Commonly known as: CARAFATE   traMADol 50 MG tablet Commonly known as: ULTRAM       TAKE these medications    Accu-Chek Guide test strip Generic drug: glucose blood Check blood sugar once  daily.   atorvastatin 40 MG tablet Commonly known as: LIPITOR Take 1 tablet (40 mg total) by mouth daily.   azithromycin 1 % ophthalmic solution Commonly known as: AZASITE Place 1 drop into the right eye 2 (two) times daily. For 10 days   bisacodyl 10 MG  suppository Commonly known as: DULCOLAX Place 1 suppository (10 mg total) rectally daily as needed for moderate constipation.   blood glucose meter kit and supplies Kit Dispense based on patient and insurance preference. Use up to four times daily as directed.   dicyclomine 10 MG capsule Commonly known as: BENTYL Take 1 capsule (10 mg total) by mouth 3 (three) times daily before meals.   diphenhydrAMINE 25 mg capsule Commonly known as: BENADRYL Take 25 mg by mouth at bedtime as needed for sleep.   Eliquis 5 MG Tabs tablet Generic drug: apixaban Take 1 tablet (5 mg total) by mouth 2 (two) times daily.   FreeStyle Libre 14 Day Reader Kerrin Mo Use as instructed. Check blood glucose level by fingerstick 3times per day. E11.65 G61.81 G60.9   FreeStyle Libre 14 Day Sensor Misc Use as instructed. Check blood glucose level by fingerstick 3times per day. E11.65 G61.81 G60.9   glipiZIDE 5 MG tablet Commonly known as: GLUCOTROL Take 5 mg by mouth daily before breakfast. What changed: Another medication with the same name was removed. Continue taking this medication, and follow the directions you see here.   lipase/protease/amylase 12000-38000 units Cpep capsule Commonly known as: CREON Take 2 capsules (24,000 Units total) by mouth 3 (three) times daily before meals. What changed: how much to take   metFORMIN 500 MG 24 hr tablet Commonly known as: GLUCOPHAGE-XR Take 500 mg by mouth in the morning and at bedtime. What changed: Another medication with the same name was removed. Continue taking this medication, and follow the directions you see here.   metoCLOPramide 5 MG tablet Commonly known as: Reglan Take 1 tablet (5 mg total) by mouth every 8 (eight) hours as needed for nausea.   multivitamin tablet Take 1 tablet by mouth daily.   nortriptyline 10 MG capsule Commonly known as: PAMELOR TAKE 1 CAPSULE BY MOUTH DAILY AND TAKE 2 CAPSULES BY MOUTH AT BEDTIME FOR NEUROPATHY What  changed: See the new instructions.   ondansetron 4 MG tablet Commonly known as: Zofran Take 1 tablet (4 mg total) by mouth every 8 (eight) hours as needed for nausea or vomiting.   Oxycodone HCl 10 MG Tabs Take 1 tablet (10 mg total) by mouth 4 (four) times daily as needed (pain). What changed:  when to take this reasons to take this   pantoprazole 40 MG tablet Commonly known as: PROTONIX Take 40 mg by mouth daily. What changed: Another medication with the same name was removed. Continue taking this medication, and follow the directions you see here.   polyethylene glycol 17 g packet Commonly known as: MIRALAX / GLYCOLAX Take 17 g by mouth 2 (two) times daily.   traZODone 50 MG tablet Commonly known as: DESYREL Take 1-2 tablets (50-100 mg total) by mouth at bedtime as needed for sleep. What changed: how much to take        Follow-up Information     [name], NP. Schedule an appointment as soon as possible for a visit in 2 week(s).   Specialty: Nurse Practitioner Contact information: [place] [address] [phone]         Arta Silence, MD Follow up.   Specialty:  Gastroenterology Why: call for appointment in next 1-2 months Contact information: [address] [address] [phone]         [name], MD Follow up on 12/18/2020.   Specialty: General Practice Why: 9:00am Pain Management Bring discharge paperwork, insurance card and photo ID Contact information: [place] [address] [phone]                No Known Allergies  Consultations: Gastroenterology   Procedures/Studies: DG Abd 1 View  Result Date: 12/10/2020 CLINICAL DATA:  Ileus.  Lower abdominal pain and vomiting. EXAM: ABDOMEN - 1 VIEW COMPARISON:  12/06/2020 FINDINGS: There is mild dilatation of multiple small bowel loops throughout the central abdomen which has increased from the prior  study. Gas is present in the colon and rectum. A small left pleural effusion is suspected. No acute osseous abnormality is seen. IMPRESSION: Mildly increased small bowel dilatation which could reflect ileus or partial obstruction. Electronically Signed   By: Logan Bores M.D.   On: 12/10/2020 12:30   DG Abd 1 View  Result Date: 12/05/2020 CLINICAL DATA:  History of necrotizing pancreatitis and chronic pain. EXAM: ABDOMEN - 1 VIEW COMPARISON:  CT AP 12/02/2020 FINDINGS: There is increase caliber of several central loops of small bowel which measure up to 4.3 cm. Gas is noted within the colon up to the level of the rectum. No radio-opaque calculi or other significant radiographic abnormality are seen. IMPRESSION: Increase caliber of several central loops of small bowel compatible with ileus or partial obstruction. Electronically Signed   By: Kerby Moors M.D.   On: 12/05/2020 11:28   US Abdomen Complete  Result Date: 11/15/2020 CLINICAL DATA:  Abdominal pain.  History of pancreatitis EXAM: ABDOMEN ULTRASOUND COMPLETE COMPARISON:  CT 10/30/2020 FINDINGS: Gallbladder: No gallstones or wall thickening visualized. No sonographic Murphy sign noted by sonographer. Common bile duct: Diameter: Normal caliber, 4 mm Liver: Increased echotexture compatible with fatty infiltration. No focal abnormality or biliary ductal dilatation. Portal vein is patent on color Doppler imaging with normal direction of blood flow towards the liver. IVC: No abnormality visualized. Pancreas: Not visualized due to overlying bowel gas. Spleen: Size and appearance within normal limits. Right Kidney: Length: 11.0 cm. Echogenicity within normal limits. No mass or hydronephrosis visualized. Left Kidney: Length: 11.4 cm. Echogenicity within normal limits. No mass or hydronephrosis visualized. Abdominal aorta: No aneurysm visualized. Other findings: None. IMPRESSION: No cholelithiasis or cholecystitis. Hepatic steatosis. Pancreas not visualized  due to overlying bowel gas. Electronically Signed   By: Rolm Baptise M.D.   On: 11/15/2020 10:32   CT ABDOMEN PELVIS W CONTRAST  Result Date: 12/02/2020 CLINICAL DATA:  Nausea, vomiting and epigastric pain. EXAM: CT ABDOMEN AND PELVIS WITH CONTRAST TECHNIQUE: Multidetector CT imaging of the abdomen and pelvis was performed using the standard protocol following bolus administration of intravenous contrast. CONTRAST:  58m OMNIPAQUE IOHEXOL 350 MG/ML SOLN COMPARISON:  October 30, 2020 FINDINGS: Lower chest: Mild linear atelectasis is seen within the left lung base. Hepatobiliary: There is diffuse fatty infiltration of the liver parenchyma. No focal liver abnormality is seen. No gallstones, gallbladder wall thickening, or biliary dilatation. Pancreas: Evidence of necrotizing pancreatitis is again seen. A 10.2 cm x 3.4 cm fluid collection is seen within the region of the pancreatic body and tail. This is mildly decreased in size when compared to the prior study (measured approximately 11.8 cm x 3.5 cm on the prior study). An additional 2.7 cm  x 2.1 cm fluid collection is seen inferior and anterior to the pancreatic head. This measures approximately 3.5 cm x 2.2 cm on the prior study. Spleen: Normal in size without focal abnormality. Adrenals/Urinary Tract: Adrenal glands are unremarkable. Kidneys are normal, without renal calculi, focal lesion, or hydronephrosis. Bladder is unremarkable. Stomach/Bowel: Stomach is within normal limits. The appendix is not visualized. No evidence of bowel wall thickening, distention, or inflammatory changes. Vascular/Lymphatic: Aortic atherosclerosis. No enlarged abdominal or pelvic lymph nodes. Reproductive: Prostate is unremarkable. Other: No abdominal wall hernia or abnormality. No abdominopelvic ascites. Musculoskeletal: No acute or significant osseous findings. IMPRESSION: 1. Evidence of necrotizing pancreatitis involving the pancreatic body and tail. 2. Interval decrease in size  of peripancreatic fluid collections, as described above. 3. Fatty liver. 4. Aortic atherosclerosis. Aortic Atherosclerosis (ICD10-I70.0). Electronically Signed   By: Virgina Norfolk M.D.   On: 12/02/2020 01:06   MR 3D Recon At Scanner  Result Date: 12/03/2020 CLINICAL DATA:  60 year old male with history of abdominal pain. Pancreatitis. EXAM: MRI ABDOMEN WITHOUT AND WITH CONTRAST (INCLUDING MRCP) TECHNIQUE: Multiplanar multisequence MR imaging of the abdomen was performed both before and after the administration of intravenous contrast. Heavily T2-weighted images of the biliary and pancreatic ducts were obtained, and three-dimensional MRCP images were rendered by post processing. CONTRAST:  25m GADAVIST GADOBUTROL 1 MMOL/ML IV SOLN COMPARISON:  No prior abdominal MRI. CT the abdomen and pelvis 12/02/2020. FINDINGS: Lower chest: Areas of increased signal intensity in the dependent portions of the lungs bilaterally, likely to reflect areas of dependent subsegmental atelectasis but poorly evaluated on today's magnetic resonance examination. Hepatobiliary: Diffuse loss of signal intensity throughout the hepatic parenchyma on out of phase dual echo images, indicative of a background of severe hepatic steatosis. No suspicious cystic or solid hepatic lesions. No intra or extrahepatic biliary ductal dilatation noted on MRCP images. Common bile duct measures 4 mm in the porta hepatis. No filling defect in the common bile duct to suggest choledocholithiasis. Gallbladder is normal in appearance. Pancreas: As noted on the recent CT examination, the body and tail of the pancreas are severely atrophied, and the normal pancreatic parenchyma is largely replaced with a complex fluid collection which demonstrates heterogeneous T1 and T2 signal intensity, with no significant internal enhancement on post gadolinium imaging, compatible with widespread areas of pancreatic necrosis and complex pancreatic pseudocyst. This measures  approximately 11.2 x 3.8 x 3.6 cm. In the inferior aspect of the pancreatic head anteriorly there is a smaller 2.2 x 1.1 cm collection (axial image 28 of series 16) with similar imaging characteristics. Spleen:  Unremarkable. Adrenals/Urinary Tract: Bilateral kidneys and adrenal glands are normal in appearance. No hydroureteronephrosis in the visualized portions of the abdomen. Stomach/Bowel: Visualized portions are unremarkable. Vascular/Lymphatic: No aneurysm identified in the visualized abdominal vasculature. Splenic vein remains patent at this time. Other: No significant volume of ascites noted in the visualized portions of the peritoneal cavity. Musculoskeletal: No aggressive appearing osseous lesions are noted in the visualized portions of the skeleton. IMPRESSION: 1. Necrotizing pancreatitis with complex pancreatic pseudocysts in the head, body and tail of the pancreas, as detailed above. 2. Severe hepatic steatosis. Electronically Signed   By: DVinnie LangtonM.D.   On: 12/03/2020 07:13   DG Abd Portable 1V  Result Date: 12/06/2020 CLINICAL DATA:  Ileus. EXAM: PORTABLE ABDOMEN - 1 VIEW COMPARISON:  December 05, 2020. FINDINGS: The bowel gas pattern is normal. No radio-opaque calculi or other significant radiographic abnormality are seen. IMPRESSION: Negative. Electronically Signed  By: Marijo Conception M.D.   On: 12/06/2020 09:24   MR ABDOMEN MRCP W WO CONTAST  Result Date: 12/03/2020 CLINICAL DATA:  60 year old male with history of abdominal pain. Pancreatitis. EXAM: MRI ABDOMEN WITHOUT AND WITH CONTRAST (INCLUDING MRCP) TECHNIQUE: Multiplanar multisequence MR imaging of the abdomen was performed both before and after the administration of intravenous contrast. Heavily T2-weighted images of the biliary and pancreatic ducts were obtained, and three-dimensional MRCP images were rendered by post processing. CONTRAST:  77m GADAVIST GADOBUTROL 1 MMOL/ML IV SOLN COMPARISON:  No prior abdominal MRI. CT the  abdomen and pelvis 12/02/2020. FINDINGS: Lower chest: Areas of increased signal intensity in the dependent portions of the lungs bilaterally, likely to reflect areas of dependent subsegmental atelectasis but poorly evaluated on today's magnetic resonance examination. Hepatobiliary: Diffuse loss of signal intensity throughout the hepatic parenchyma on out of phase dual echo images, indicative of a background of severe hepatic steatosis. No suspicious cystic or solid hepatic lesions. No intra or extrahepatic biliary ductal dilatation noted on MRCP images. Common bile duct measures 4 mm in the porta hepatis. No filling defect in the common bile duct to suggest choledocholithiasis. Gallbladder is normal in appearance. Pancreas: As noted on the recent CT examination, the body and tail of the pancreas are severely atrophied, and the normal pancreatic parenchyma is largely replaced with a complex fluid collection which demonstrates heterogeneous T1 and T2 signal intensity, with no significant internal enhancement on post gadolinium imaging, compatible with widespread areas of pancreatic necrosis and complex pancreatic pseudocyst. This measures approximately 11.2 x 3.8 x 3.6 cm. In the inferior aspect of the pancreatic head anteriorly there is a smaller 2.2 x 1.1 cm collection (axial image 28 of series 16) with similar imaging characteristics. Spleen:  Unremarkable. Adrenals/Urinary Tract: Bilateral kidneys and adrenal glands are normal in appearance. No hydroureteronephrosis in the visualized portions of the abdomen. Stomach/Bowel: Visualized portions are unremarkable. Vascular/Lymphatic: No aneurysm identified in the visualized abdominal vasculature. Splenic vein remains patent at this time. Other: No significant volume of ascites noted in the visualized portions of the peritoneal cavity. Musculoskeletal: No aggressive appearing osseous lesions are noted in the visualized portions of the skeleton. IMPRESSION: 1.  Necrotizing pancreatitis with complex pancreatic pseudocysts in the head, body and tail of the pancreas, as detailed above. 2. Severe hepatic steatosis. Electronically Signed   By: DVinnie LangtonM.D.   On: 12/03/2020 07:13      Subjective: Feels better today.  Abdominal pain has improved.  No nausea or vomiting.  Feels ready to go home.  Discharge Exam: Vitals:   12/13/20 1354 12/13/20 2155 12/14/20 0516 12/14/20 1322  BP: 129/90 (!) 123/94 116/85 (!) 144/92  Pulse: 74 75 75 71  Resp: _0 Temp: 98.3 F (36.8 C) 98.2 F (36.8 C) 97.9 F (36.6 C) 98.3 F (36.8 C)  TempSrc: Oral Oral Oral Oral  SpO2: 96% 97% 98% 100%  Weight:      Height:        General: Pt is alert, awake, not in acute distress Cardiovascular: RRR, S1/S2 +, no rubs, no gallops Respiratory: CTA bilaterally, no wheezing, no rhonchi Abdominal: Soft, NT, ND, bowel sounds + Extremities: no edema, no cyanosis    The results of significant diagnostics from this hospitalization (including imaging, microbiology, ancillary and laboratory) are listed below for reference.     Microbiology: No results found for this or any previous visit (from the past 240 hour(s)).   Labs: BNP (last  3 results) No results for input(s): BNP in the last 8760 hours. Basic Metabolic Panel: Recent Labs  Lab 12/08/20 0623 12/09/20 0601 12/10/20 1224 12/11/20 0504 12/11/20 1245 12/12/20 0525  NA 144 140 135  --   --  138  K 4.0 3.8 3.8  --   --  4.0  CL 106 104 100  --   --  102  CO2 _0 --   --  28  GLUCOSE 124* 113* 155*  --   --  154*  BUN <5* <5* <5*  --   --  5*  CREATININE 0.81 0.71 0.68  --   --  0.74  CALCIUM 9.2 9.1 8.7*  --   --  8.8*  MG  --   --  1.5* 1.6* 1.6*  --    Liver Function Tests: Recent Labs  Lab 12/12/20 0525  AST 25  ALT 15  ALKPHOS 89  BILITOT 0.5  PROT 5.8*  ALBUMIN 2.9*   Recent Labs  Lab 12/08/20 0638  LIPASE 22   No results for input(s): AMMONIA in the last 168  hours. CBC: Recent Labs  Lab 12/13/20 0458  WBC 6.9  HGB 13.8  HCT 41.8  MCV 94.1  PLT 151   Cardiac Enzymes: No results for input(s): CKTOTAL, CKMB, CKMBINDEX, TROPONINI in the last 168 hours. BNP: Invalid input(s): POCBNP CBG: Recent Labs  Lab 12/13/20 1143 12/13/20 1646 12/13/20 2143 12/14/20 0728 12/14/20 1151  GLUCAP 134* 111* 156* 134* 151*   D-Dimer No results for input(s): DDIMER in the last 72 hours. Hgb A1c No results for input(s): HGBA1C in the last 72 hours. Lipid Profile No results for input(s): CHOL, HDL, LDLCALC, TRIG, CHOLHDL, LDLDIRECT in the last 72 hours. Thyroid function studies No results for input(s): TSH, T4TOTAL, T3FREE, THYROIDAB in the last 72 hours.  Invalid input(s): FREET3 Anemia work up No results for input(s): VITAMINB12, FOLATE, FERRITIN, TIBC, IRON, RETICCTPCT in the last 72 hours. Urinalysis    Component Value Date/Time   COLORURINE AMBER (A) 12/01/2020 2335   APPEARANCEUR HAZY (A) 12/01/2020 2335   LABSPEC 1.019 12/01/2020 2335   PHURINE 5.0 12/01/2020 2335   GLUCOSEU 50 (A) 12/01/2020 2335   HGBUR NEGATIVE 12/01/2020 2335   BILIRUBINUR NEGATIVE 12/01/2020 2335   KETONESUR 5 (A) 12/01/2020 2335   PROTEINUR 100 (A) 12/01/2020 2335   NITRITE NEGATIVE 12/01/2020 2335   LEUKOCYTESUR NEGATIVE 12/01/2020 2335   Sepsis Labs Invalid input(s): PROCALCITONIN,  WBC,  LACTICIDVEN Microbiology No results found for this or any previous visit (from the past 240 hour(s)).   Time coordinating discharge: 44mns  SIGNED:   JKathie Dike MD  [facility] 12/14/2020, 8:34 PM   If 7PM-7AM, please contact night-coverage [email or web address]

## 2020-12-17 ENCOUNTER — Telehealth: Payer: Self-pay

## 2020-12-17 NOTE — Telephone Encounter (Signed)
Transition Care Management Follow-up Telephone Call Date of discharge and from where: 12/14/2020, [facility]  How have you been since you were released from the hospital? He said so far, so good Any questions or concerns? No  Items Reviewed: Did the pt receive and understand the discharge instructions provided? Yes  Medications obtained and verified?  He said he has most medications but still has a few that he needs to pick up.  He didn't have any questions about his med regime  Other? No  Any new allergies since your discharge? No  Do you have support at home?  Lives alone  Home Care and Equipment/Supplies: Were home health services ordered? no If so, what is the name of the agency? N/a  Has the agency set up a time to come to the patient's home? not applicable Were any new equipment or medical supplies ordered?  No What is the name of the medical supply agency? N/a Were you able to get the supplies/equipment? not applicable Do you have any questions related to the use of the equipment or supplies? No  He has [employer] but said the he doesn't use it. He said that he uses the regular glucometer.   Functional Questionnaire: (I = Independent and D = Dependent) ADLs: independent. Has walker to use when ambulating longer distances.    Follow up appointments reviewed:  PCP Hospital f/u appt confirmed? Yes  Scheduled to see [name], NP on 12/24/2020 @ 1050. Specialist Hospital f/u appt confirmed? Yes  Scheduled to see pain management on 12/18/2020 Are transportation arrangements needed? Yes  - he said he will call and [employer]. Provided him with the phone number for [facility] # [phone] to contact for free transportation to medical appointments If their condition worsens, is the pt aware to call PCP or go to the Emergency Dept.? Yes Was the patient provided with contact information for the PCP's office or ED? Yes Was to pt encouraged to call back with questions  or concerns? Yes

## 2020-12-18 DIAGNOSIS — Z1159 Encounter for screening for other viral diseases: Secondary | ICD-10-CM | POA: Diagnosis not present

## 2020-12-18 DIAGNOSIS — R5383 Other fatigue: Secondary | ICD-10-CM | POA: Diagnosis not present

## 2020-12-18 DIAGNOSIS — Z8719 Personal history of other diseases of the digestive system: Secondary | ICD-10-CM | POA: Diagnosis not present

## 2020-12-18 DIAGNOSIS — Z79899 Other long term (current) drug therapy: Secondary | ICD-10-CM | POA: Diagnosis not present

## 2020-12-18 DIAGNOSIS — E559 Vitamin D deficiency, unspecified: Secondary | ICD-10-CM | POA: Diagnosis not present

## 2020-12-18 DIAGNOSIS — Z0000 Encounter for general adult medical examination without abnormal findings: Secondary | ICD-10-CM | POA: Diagnosis not present

## 2020-12-20 DIAGNOSIS — R109 Unspecified abdominal pain: Secondary | ICD-10-CM | POA: Diagnosis not present

## 2020-12-23 ENCOUNTER — Encounter (HOSPITAL_COMMUNITY): Payer: Self-pay | Admitting: Emergency Medicine

## 2020-12-23 ENCOUNTER — Other Ambulatory Visit: Payer: Self-pay

## 2020-12-23 ENCOUNTER — Emergency Department (HOSPITAL_COMMUNITY): Payer: Medicaid Other

## 2020-12-23 ENCOUNTER — Emergency Department (HOSPITAL_COMMUNITY)
Admission: EM | Admit: 2020-12-23 | Discharge: 2020-12-23 | Disposition: A | Discharge status: Home / Self Care | Payer: Medicaid Other | Attending: Emergency Medicine | Admitting: Emergency Medicine

## 2020-12-23 DIAGNOSIS — R112 Nausea with vomiting, unspecified: Secondary | ICD-10-CM

## 2020-12-23 DIAGNOSIS — R000 Tachycardia, unspecified: Secondary | ICD-10-CM | POA: Insufficient documentation

## 2020-12-23 DIAGNOSIS — Z20822 Contact with and (suspected) exposure to covid-19: Secondary | ICD-10-CM | POA: Diagnosis not present

## 2020-12-23 DIAGNOSIS — Z7901 Long term (current) use of anticoagulants: Secondary | ICD-10-CM | POA: Diagnosis not present

## 2020-12-23 DIAGNOSIS — Z79899 Other long term (current) drug therapy: Secondary | ICD-10-CM | POA: Diagnosis not present

## 2020-12-23 DIAGNOSIS — R109 Unspecified abdominal pain: Secondary | ICD-10-CM | POA: Diagnosis not present

## 2020-12-23 DIAGNOSIS — K760 Fatty (change of) liver, not elsewhere classified: Secondary | ICD-10-CM | POA: Insufficient documentation

## 2020-12-23 DIAGNOSIS — K2970 Gastritis, unspecified, without bleeding: Secondary | ICD-10-CM | POA: Diagnosis not present

## 2020-12-23 DIAGNOSIS — F17210 Nicotine dependence, cigarettes, uncomplicated: Secondary | ICD-10-CM | POA: Insufficient documentation

## 2020-12-23 DIAGNOSIS — Z7984 Long term (current) use of oral hypoglycemic drugs: Secondary | ICD-10-CM | POA: Insufficient documentation

## 2020-12-23 DIAGNOSIS — E119 Type 2 diabetes mellitus without complications: Secondary | ICD-10-CM | POA: Insufficient documentation

## 2020-12-23 DIAGNOSIS — R1013 Epigastric pain: Secondary | ICD-10-CM | POA: Diagnosis present

## 2020-12-23 LAB — CBC
HCT: 53.1 % — ABNORMAL HIGH (ref 39.0–52.0)
Hemoglobin: 18.1 g/dL — ABNORMAL HIGH (ref 13.0–17.0)
MCH: 31.1 pg (ref 26.0–34.0)
MCHC: 34.1 g/dL (ref 30.0–36.0)
MCV: 91.2 fL (ref 80.0–100.0)
Platelets: 343 10*3/uL (ref 150–400)
RBC: 5.82 MIL/uL — ABNORMAL HIGH (ref 4.22–5.81)
RDW: 14.2 % (ref 11.5–15.5)
WBC: 16 10*3/uL — ABNORMAL HIGH (ref 4.0–10.5)
nRBC: 0 % (ref 0.0–0.2)

## 2020-12-23 LAB — URINALYSIS, ROUTINE W REFLEX MICROSCOPIC
Glucose, UA: 100 mg/dL — AB
Ketones, ur: NEGATIVE mg/dL
Leukocytes,Ua: NEGATIVE
Nitrite: NEGATIVE
Protein, ur: 100 mg/dL — AB
Specific Gravity, Urine: <1.005 — ABNORMAL LOW (ref 1.005–1.030)
pH: 6 (ref 5.0–8.0)

## 2020-12-23 LAB — URINALYSIS, MICROSCOPIC (REFLEX): Squamous Epithelial / HPF: NONE SEEN (ref 0–5)

## 2020-12-23 LAB — RESP PANEL BY RT-PCR (FLU A&B, COVID) ARPGX2
Influenza A by PCR: NEGATIVE
Influenza B by PCR: NEGATIVE
SARS Coronavirus 2 by RT PCR: NEGATIVE

## 2020-12-23 LAB — COMPREHENSIVE METABOLIC PANEL
ALT: 27 U/L (ref 0–44)
AST: 40 U/L (ref 15–41)
Albumin: 4.2 g/dL (ref 3.5–5.0)
Alkaline Phosphatase: 90 U/L (ref 38–126)
Anion gap: 15 (ref 5–15)
BUN: 9 mg/dL (ref 6–20)
CO2: 20 mmol/L — ABNORMAL LOW (ref 22–32)
Calcium: 9.7 mg/dL (ref 8.9–10.3)
Chloride: 101 mmol/L (ref 98–111)
Creatinine, Ser: 1.2 mg/dL (ref 0.61–1.24)
GFR, Estimated: >60 mL/min (ref >60)
Glucose, Bld: 237 mg/dL — ABNORMAL HIGH (ref 70–99)
Potassium: 3.7 mmol/L (ref 3.5–5.1)
Sodium: 136 mmol/L (ref 135–145)
Total Bilirubin: 1 mg/dL (ref 0.3–1.2)
Total Protein: 8.3 g/dL — ABNORMAL HIGH (ref 6.5–8.1)

## 2020-12-23 LAB — LIPASE, BLOOD: Lipase: 27 U/L (ref 11–51)

## 2020-12-23 LAB — ETHANOL: Alcohol, Ethyl (B): <10 mg/dL (ref <10)

## 2020-12-23 MED ORDER — MORPHINE SULFATE (PF) 4 MG/ML IV SOLN
4.0000 mg | Freq: Once | INTRAVENOUS | Status: AC
Start: 2020-12-23 — End: 2020-12-23
  Administered 2020-12-23: 4 mg via INTRAVENOUS
  Filled 2020-12-23: qty 1

## 2020-12-23 MED ORDER — FAMOTIDINE IN NACL 20-0.9 MG/50ML-% IV SOLN
20.0000 mg | Freq: Once | INTRAVENOUS | Status: AC
  Administered 2020-12-23: 20 mg via INTRAVENOUS
  Filled 2020-12-23: qty 50

## 2020-12-23 MED ORDER — HYDROCODONE-ACETAMINOPHEN 5-325 MG PO TABS: 1.0000 | ORAL_TABLET | ORAL | 0 refills | Status: DC | PRN

## 2020-12-23 MED ORDER — IOHEXOL 350 MG/ML SOLN
100.0000 mL | Freq: Once | INTRAVENOUS | Status: AC | PRN
  Administered 2020-12-23: 100 mL via INTRAVENOUS

## 2020-12-23 MED ORDER — ONDANSETRON 4 MG PO TBDP
4.0000 mg | ORAL_TABLET | Freq: Once | ORAL | Status: AC | PRN
  Administered 2020-12-23: 4 mg via ORAL
  Filled 2020-12-23: qty 1

## 2020-12-23 MED ORDER — SODIUM CHLORIDE 0.9 % IV BOLUS
1000.0000 mL | Freq: Once | INTRAVENOUS | Status: AC
  Administered 2020-12-23: 1000 mL via INTRAVENOUS

## 2020-12-23 MED ORDER — ONDANSETRON 4 MG PO TBDP: 4.0000 mg | ORAL_TABLET | Freq: Three times a day (TID) | ORAL | 0 refills | Status: DC | PRN

## 2020-12-23 MED ORDER — MORPHINE SULFATE (PF) 4 MG/ML IV SOLN
4.0000 mg | Freq: Once | INTRAVENOUS | Status: AC
  Administered 2020-12-23: 4 mg via INTRAVENOUS
  Filled 2020-12-23: qty 1

## 2020-12-23 MED ORDER — DICYCLOMINE HCL 10 MG PO CAPS
10.0000 mg | ORAL_CAPSULE | Freq: Once | ORAL | Status: AC
  Administered 2020-12-23: 10 mg via ORAL
  Filled 2020-12-23: qty 1

## 2020-12-23 MED ORDER — SUCRALFATE 1 G PO TABS: 1.0000 g | ORAL_TABLET | Freq: Three times a day (TID) | ORAL | 0 refills | Status: DC

## 2020-12-23 MED ORDER — SODIUM CHLORIDE 0.9% FLUSH
3.0000 mL | Freq: Once | INTRAVENOUS | Status: AC
Start: 2020-12-23 — End: 2020-12-23
  Administered 2020-12-23: 3 mL via INTRAVENOUS

## 2020-12-23 MED ORDER — PANTOPRAZOLE SODIUM 40 MG PO TBEC: 40.0000 mg | DELAYED_RELEASE_TABLET | Freq: Every day | ORAL | 0 refills | Status: DC
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
## 2020-12-23 NOTE — ED Triage Notes (Signed)
Reports his pancreatitis has returned, emesis and diarrhea x4 days, has not been able to keep down food or water. Last drink mid-July.

## 2020-12-23 NOTE — ED Provider Notes (Signed)
Emergency Medicine Provider Triage Evaluation Note  Bryce Mendoza , a 60 y.o. male  was evaluated in triage.  Pt complains of diffuse abdominal pain for 4 days.  Constant and worsening.  Has a history of pancreatitis secondary to alcohol versus autoimmune disease.  Reports associated intractable nausea, vomiting, and diarrhea.  No fever.  Reports dysuria and shortness of breath as well.  Review of Systems  Positive:  Negative: See above   Physical Exam  BP (!) 127/98 (BP Location: Left Arm)   Pulse (!) 118   Temp 97.9 F (36.6 C) (Oral)   Resp 18   SpO2 97%  Gen:   Awake, no distress   Resp:  Normal effort  MSK:   Moves extremities without difficulty Other:  Diffuse abdominal tenderness.  Medical Decision Making  Medically screening exam initiated at 1:03 PM.  Appropriate orders placed.  Bryce Mendoza was informed that the remainder of the evaluation will be completed by another provider, this initial triage assessment does not replace that evaluation, and the importance of remaining in the ED until their evaluation is complete.     Teressa Lower, PA-C 12/23/20 1304    Sloan Leiter, DO 12/23/20 [place]

## 2020-12-23 NOTE — Progress Notes (Signed)
RN [place] Garrison agree to watch patient since we are placing the IV, while the patient is in the lobby. Patient instructed that he is to notify RN [place] Garrison if she should want to leave without being seen.

## 2020-12-24 ENCOUNTER — Ambulatory Visit: Payer: Medicaid Other | Admitting: Nurse Practitioner

## 2021-01-01 DIAGNOSIS — I10 Essential (primary) hypertension: Secondary | ICD-10-CM | POA: Diagnosis not present

## 2021-01-01 DIAGNOSIS — E559 Vitamin D deficiency, unspecified: Secondary | ICD-10-CM | POA: Diagnosis not present

## 2021-01-01 DIAGNOSIS — Z79899 Other long term (current) drug therapy: Secondary | ICD-10-CM | POA: Diagnosis not present

## 2021-01-01 DIAGNOSIS — R059 Cough, unspecified: Secondary | ICD-10-CM | POA: Diagnosis not present

## 2021-01-01 DIAGNOSIS — M792 Neuralgia and neuritis, unspecified: Secondary | ICD-10-CM | POA: Diagnosis not present

## 2021-01-02 NOTE — Progress Notes (Signed)
Patient ID: Bryce Mendoza, male   DOB: [birth year], 60 y.o.   MRN: [id number]    Bryce Mendoza, is a 60 y.o. male  [id number]  [id number]  DOB - [birth year]  Chief Complaint  Patient presents with   Hospitalization Follow-up       Subjective:   Bryce Mendoza is a 60 y.o. male here today for a follow up visit After hospitalization 10/1-10/14/2022.  Seen in ED 12/23/2020.  About July of 2022 he was diagnosed as diabetic and had his first round of pancreatitis.  He says he was a moderate drinker PTA at that time but it has been unclear whether the pancreatitis was from alcohol or and unclear autoimmune dz he has had for about 1.5 years.     He reports Blood sugars at home 140-180   Until he ran out of glipizide about 1 week to 10 days ago.  Appetite is fair but he has been vomiting some after he tries to eat.  He feels as though the "food doesn't move" after he eats.  His weight is down from 265 10/23 ED visit but down from 279 10/6.  Today he is 245.  Abdominal pain is mild and intermittent now/much improved.  He is tolerating liquids well and using low sugar boost to supplement nutrition.  No fever.  Bowels sluggish but also started on oxycodone for pain by the pain clinic  From discharge summary: Discharge Diagnoses:  Principal Problem:   Chronic pancreatitis ([place]) Active Problems:   CIDP (chronic inflammatory demyelinating polyneuropathy) (HCC)   Necrotizing pancreatitis   DM type 2 (diabetes mellitus, type 2) (HCC)   Protein-calorie malnutrition, severe   Morbid obesity ([place])   1.  Ileus Appears to be having bowel movements -continue on miralax, suppository prn  2.  Necrotizing pancreatitis, recurrent Responding to conservative management Followed by [facility] Continue pain management Patient initially treated with oral oxycodone and IV Dilaudid for severe pain -Discussed expectations around pain management -IV Dilaudid was weaned off and pain appears to be reasonably  controlled with oral oxycodone -We will try and get patient set up with a pain management clinic since this appears to be the main barrier to keeping him out of the hospital -he has had recurrent admissions to the hospital for uncontrolled pain -He reports that his primary care will only prescribe tramadol which would not adequately control his symptoms -I have gotten him an appointment [facility] for 10/18 -He will need outpatient follow-up with GI for repeat CT scan in the next 1 to 2 months. -unfortunately, due to insurance barriers, he likely will not be able to have home visiting nurse upon discharge.  3.  History of DVT/PE continue Eliquis  4.  Type 2 diabetes mellitus  -blood sugars  have been stable -Resume home regimen on discharge  5.  Class II obesity, BMI 36.3  6.  Protein malnutrition.  Appears to be tolerating po intake   7.  Chronic inflammatory demyelinating polyneuropathy: CIDP Continue Pamelor  8.  Constipation.  Last bowel movement yesterday after suppository.  He is on MiraLAX.  Will continue suppository prn  ED on 12/23/2020: Work up as above, notable for:    Labs & imaging results that were available during my care of the patient were reviewed by me and considered in my medical decision making.   I ordered imaging studies which included CT a/p and I independently visualized and interpreted imaging which showed multiple pancreatic fluid collections consistent with  sequelae of necrotizing pancreatitis, no evidence of acute pancreatitis.  No abscess.   Management: Patient given analgesics, antiemetics, IV fluids   Reassessment:    Patient is able to tolerate PO. CT is stable. No acute pancreatitis. Pain well controlled.     Favor gastritis as etiology of complaints. No melena or BRBPR. Advised starting PPI, bland diet. Recommend pt f/u with GI. He reports he is supposed to have EGD In the near future, he will f/u with his GI regarding this.       The patient improved significantly and was discharged in stable condition. Detailed discussions were had with the patient regarding current findings, and need for close f/u with PCP or on call doctor. The patient has been instructed to return immediately if the symptoms worsen in any way for re-evaluation. Patient verbalized understanding and is in agreement with current care plan. All questions answered prior to discharge.  No problems updated.  ALLERGIES: No Known Allergies  PAST MEDICAL HISTORY: Past Medical History:  Diagnosis Date   Ascending paralysis ([place]) 06/2019   DVT (deep venous thrombosis) ([place]) 09/09/2017   Empyema lung ([place])    Pulmonary embolism ([place]) 09/08/2017    MEDICATIONS AT HOME: Prior to Admission medications   Medication Sig Start Date End Date Taking? Authorizing Provider  Accu-Chek Softclix Lancets lancets Use as instructed 01/03/21  Yes Myanna Ziesmer, Dionne Bucy, PA-C  apixaban (ELIQUIS) 5 MG TABS tablet Take 1 tablet (5 mg total) by mouth 2 (two) times daily. 07/11/20  Yes [name], NP  azithromycin (AZASITE) 1 % ophthalmic solution Place 1 drop into the right eye 2 (two) times daily. For 10 days 12/14/20  Yes Kathie Dike, MD  blood glucose meter kit and supplies KIT Dispense based on patient and insurance preference. Use up to four times daily as directed. 10/09/20  Yes Barb Merino, MD  Blood Glucose Monitoring Suppl (ACCU-CHEK GUIDE ME) w/Device KIT 1 each by Does not apply route 2 (two) times daily. 01/03/21  Yes Freeman Caldron M, PA-C  diphenhydrAMINE (BENADRYL) 25 mg capsule Take 25 mg by mouth at bedtime as needed for sleep.   Yes [provider]  Insulin Pen Needle (PEN NEEDLES 31GX5/16") 31G X 8 MM MISC 1 each by Does not apply route daily. 01/03/21  Yes Argentina Donovan, PA-C  lipase/protease/amylase (CREON) 12000-38000 units CPEP capsule Take 2 capsules (24,000 Units total) by mouth 3 (three) times daily before meals. 12/14/20  Yes  Kathie Dike, MD  metFORMIN (GLUCOPHAGE-XR) 500 MG 24 hr tablet Take 500 mg by mouth in the morning and at bedtime.   Yes [provider]  metoCLOPramide (REGLAN) 5 MG tablet Take 1 tablet (5 mg total) by mouth every 8 (eight) hours as needed for nausea. 01/03/21 01/03/22 Yes Ashyra Cantin, Dionne Bucy, PA-C  Multiple Vitamin (MULTIVITAMIN) tablet Take 1 tablet by mouth daily.   Yes [provider]  nortriptyline (PAMELOR) 10 MG capsule TAKE 1 CAPSULE BY MOUTH DAILY AND TAKE 2 CAPSULES BY MOUTH AT BEDTIME FOR NEUROPATHY Patient taking differently: Take 30 mg by mouth 2 (two) times daily. NEUROPATHY 11/28/20  Yes [name], NP  ondansetron (ZOFRAN ODT) 4 MG disintegrating tablet Take 1 tablet (4 mg total) by mouth every 8 (eight) hours as needed for nausea or vomiting. 12/23/20  Yes [name], DO  ondansetron (ZOFRAN) 4 MG tablet Take 1 tablet (4 mg total) by mouth every 8 (eight) hours as needed for nausea or vomiting. 11/17/20 11/17/21 Yes Girguis,  Shanon Brow, MD  Oxycodone HCl 10 MG TABS Take 1 tablet (10 mg total) by mouth 4 (four) times daily as needed (pain). 12/14/20  Yes Kathie Dike, MD  pantoprazole (PROTONIX) 40 MG tablet Take 40 mg by mouth daily.   Yes [provider]  pantoprazole (PROTONIX) 40 MG tablet Take 1 tablet (40 mg total) by mouth daily for 14 days. 12/23/20 01/06/21 Yes [name], DO  atorvastatin (LIPITOR) 40 MG tablet Take 1 tablet (40 mg total) by mouth daily. Patient not taking: Reported on 01/03/2021 07/17/20   [name], NP  bisacodyl (DULCOLAX) 10 MG suppository Place 1 suppository (10 mg total) rectally daily as needed for moderate constipation. Patient not taking: Reported on 01/03/2021 12/14/20   Kathie Dike, MD  Continuous Blood Gluc Receiver (FREESTYLE LIBRE 14 DAY READER) DEVI Use as instructed. Check blood glucose level by fingerstick 3times per day. E11.65 G61.81 G60.9 Patient not taking: Reported on 01/03/2021 10/23/20    [name], NP  Continuous Blood Gluc Sensor (FREESTYLE LIBRE 14 DAY SENSOR) MISC Use as instructed. Check blood glucose level by fingerstick 3times per day. E11.65 G61.81 G60.9 Patient not taking: Reported on 01/03/2021 10/23/20   [name], NP  dicyclomine (BENTYL) 10 MG capsule Take 1 capsule (10 mg total) by mouth 3 (three) times daily before meals. 11/04/20 12/04/20  Barb Merino, MD  glipiZIDE (GLUCOTROL) 5 MG tablet Take 1 tablet (5 mg total) by mouth daily before breakfast. 01/03/21   Argentina Donovan, PA-C  glucose blood (ACCU-CHEK GUIDE) test strip Check blood sugar once daily. 01/03/21   Argentina Donovan, PA-C  HYDROcodone-acetaminophen (NORCO/VICODIN) 5-325 MG tablet Take 1 tablet by mouth every 4 (four) hours as needed. Patient not taking: Reported on 01/03/2021 12/23/20   [name], DO  insulin glargine (LANTUS) 100 unit/mL SOPN Inject 10 Units into the skin daily. 01/03/21   Argentina Donovan, PA-C  polyethylene glycol (MIRALAX / GLYCOLAX) 17 g packet Take 17 g by mouth 2 (two) times daily. Patient not taking: Reported on 01/03/2021 12/14/20   Kathie Dike, MD  sucralfate (CARAFATE) 1 g tablet Take 1 tablet (1 g total) by mouth 4 (four) times daily -  with meals and at bedtime for 7 days. 12/23/20 12/30/20  Jeanell Sparrow, DO  traZODone (DESYREL) 50 MG tablet Take 1-2 tablets (50-100 mg total) by mouth at bedtime as needed for sleep. Patient not taking: Reported on 01/03/2021 10/12/20 01/10/21  [name], NP    ROS: Neg HEENT Neg resp Neg cardiac Neg GU Neg MS Neg psych Neg neuro  Objective:   Vitals:   01/03/21 1104  BP: 121/90  Pulse: 83  SpO2: 96%  Weight: 243 lb 6 oz (110.4 kg)  Height: 6' 1" (1.854 m)   Exam General appearance : Awake, alert, not in any distress. Speech Clear. Not toxic looking; but does appear ill and in overall poor health HEENT: Atraumatic and Normocephalic Neck: Supple, no JVD. No cervical lymphadenopathy.  Chest:  Good air entry bilaterally, CTAB.  No rales/rhonchi/wheezing CVS: S1 S2 regular, no murmurs.  Abdomen: Bowel sounds present, Non tender and not distended with no gaurding, rigidity or rebound. Extremities: B/L Lower Ext shows no edema, both legs are warm to touch Neurology: Awake alert, and oriented X 3, CN II-XII intact, Non focal Skin: No Rash  Data Review Lab Results  Component Value Date   HGBA1C 6.7 (H) 12/01/2020   HGBA1C 9.4 (H) 09/18/2020   HGBA1C  5.3 10/13/2019    Assessment & Plan   1. Elevated glucose Hyperglycemia and diabetes with many complicationg factors.  Discussed case with Abbie Sons, Pharm D.  He has been out of glipizide for 7-10 days.  On glipizide and metformin, most readings ~200 or more and lowest of 140.  Concern for pancreatic failure.  Close follow up.  Patient to go to ED if anything worsens or blood sugars do not continue to go down after resuming glipizide, continue metformin and will start low dose lantus for titration in a few days - Glucose (CBG) - insulin aspart (novoLOG) injection 15 Units - POCT URINALYSIS DIP (CLINITEK)  2. Type 2 diabetes mellitus with hyperglycemia, unspecified whether long term insulin use (HCC) Check blood sugars at least fasting and bedtime and bring readings to appt early next week - insulin aspart (novoLOG) injection 15 Units - POCT URINALYSIS DIP (CLINITEK) - Glucose (CBG) - Comprehensive metabolic panel - CBC with Differential/Platelet - glucose blood (ACCU-CHEK GUIDE) test strip; Check blood sugar once daily.  Dispense: 100 each; Refill: 2 - glipiZIDE (GLUCOTROL) 5 MG tablet; Take 1 tablet (5 mg total) by mouth daily before breakfast.  Dispense: 30 tablet; Refill: 4 - Accu-Chek Softclix Lancets lancets; Use as instructed  Dispense: 100 each; Refill: 12 - Blood Glucose Monitoring Suppl (ACCU-CHEK GUIDE ME) w/Device KIT; 1 each by Does not apply route 2 (two) times daily.  Dispense: 1 kit; Refill: 0 - Insulin  Pen Needle (PEN NEEDLES 31GX5/16") 31G X 8 MM MISC; 1 each by Does not apply route daily.  Dispense: 100 each; Refill: 1 - insulin glargine (LANTUS) 100 unit/mL SOPN; Inject 10 Units into the skin daily.  Dispense: 15 mL; Refill: 11  3. Weight loss Likely multifactoral-  4. Necrotizing pancreatitis Pain controlled - Lipase - CBC with Differential/Platelet  5. Hospital discharge follow-up  6. Nausea Likely some gastroparesis - metoCLOPramide (REGLAN) 5 MG tablet; Take 1 tablet (5 mg total) by mouth every 8 (eight) hours as needed for nausea.  Dispense: 90 tablet; Refill: 1 - CBC with Differential/Platelet  7. High risk for readmission   Patient have been counseled extensively about nutrition and exercise. Other issues discussed during this visit include: low cholesterol diet, weight control and daily exercise, foot care, annual eye examinations at Ophthalmology, importance of adherence with medications and regular follow-up. We also discussed long term complications of uncontrolled diabetes and hypertension. Patient verbalizes understanding to go to ED if he does not start to improve quickly  Return for please schedule an appt with luke on monday or tuesday for DM/pancreatiti.  The patient was given clear instructions to go to ER or return to medical center if symptoms don't improve, worsen or new problems develop. The patient verbalized understanding. The patient was told to call to get lab results if they haven't heard anything in the next week.      Freeman Caldron, PA-C [facility] and [facility], [place]   01/03/2021, 1:02 PM

## 2021-01-03 ENCOUNTER — Other Ambulatory Visit: Payer: Self-pay

## 2021-01-03 ENCOUNTER — Encounter: Payer: Self-pay | Admitting: Physician Assistant

## 2021-01-03 ENCOUNTER — Ambulatory Visit: Payer: Medicaid Other | Attending: Nurse Practitioner | Admitting: Physician Assistant

## 2021-01-03 VITALS — BP 121/90 | HR 83 | Ht 73.0 in | Wt 243.4 lb

## 2021-01-03 DIAGNOSIS — Z09 Encounter for follow-up examination after completed treatment for conditions other than malignant neoplasm: Secondary | ICD-10-CM | POA: Diagnosis not present

## 2021-01-03 DIAGNOSIS — K8591 Acute pancreatitis with uninfected necrosis, unspecified: Secondary | ICD-10-CM | POA: Diagnosis not present

## 2021-01-03 DIAGNOSIS — Z9189 Other specified personal risk factors, not elsewhere classified: Secondary | ICD-10-CM | POA: Diagnosis not present

## 2021-01-03 DIAGNOSIS — E1165 Type 2 diabetes mellitus with hyperglycemia: Secondary | ICD-10-CM

## 2021-01-03 DIAGNOSIS — R110 Nausea: Secondary | ICD-10-CM | POA: Diagnosis not present

## 2021-01-03 DIAGNOSIS — R7309 Other abnormal glucose: Secondary | ICD-10-CM | POA: Diagnosis not present

## 2021-01-03 DIAGNOSIS — R634 Abnormal weight loss: Secondary | ICD-10-CM | POA: Diagnosis not present

## 2021-01-03 LAB — POCT URINALYSIS DIP (CLINITEK)
Blood, UA: NEGATIVE
Glucose, UA: >=1000 mg/dL — AB
Leukocytes, UA: NEGATIVE
Nitrite, UA: NEGATIVE
Spec Grav, UA: 1.01 (ref 1.010–1.025)
Urobilinogen, UA: 0.2 E.U./dL
pH, UA: 6 (ref 5.0–8.0)

## 2021-01-03 LAB — GLUCOSE, POCT (MANUAL RESULT ENTRY)
POC Glucose: 447 mg/dl — AB (ref 70–99)
POC Glucose: 392 mg/dl — AB (ref 70–99)

## 2021-01-03 MED ORDER — METOCLOPRAMIDE HCL 5 MG PO TABS: 5.0000 mg | ORAL_TABLET | Freq: Three times a day (TID) | ORAL | 1 refills | Status: DC | PRN

## 2021-01-03 MED ORDER — "PEN NEEDLES 5/16"" 31G X 8 MM MISC": 1.0000 | Freq: Every day | 1 refills | Status: DC

## 2021-01-03 MED ORDER — ACCU-CHEK GUIDE VI STRP: ORAL_STRIP | 2 refills | Status: DC

## 2021-01-03 MED ORDER — ACCU-CHEK SOFTCLIX LANCETS MISC: 12 refills | Status: DC

## 2021-01-03 MED ORDER — INSULIN ASPART 100 UNIT/ML IJ SOLN
15.0000 [IU] | Freq: Once | INTRAMUSCULAR | Status: AC
  Administered 2021-01-03: 15 [IU] via SUBCUTANEOUS

## 2021-01-03 MED ORDER — INSULIN GLARGINE 100 UNITS/ML SOLOSTAR PEN: 10.0000 [IU] | PEN_INJECTOR | Freq: Every day | SUBCUTANEOUS | 11 refills | Status: DC

## 2021-01-03 MED ORDER — ACCU-CHEK GUIDE ME W/DEVICE KIT: 1.0000 | PACK | Freq: Two times a day (BID) | 0 refills | Status: DC

## 2021-01-03 MED ORDER — GLIPIZIDE 5 MG PO TABS: 5.0000 mg | ORAL_TABLET | Freq: Every day | ORAL | 4 refills | Status: DC

## 2021-01-04 LAB — CBC WITH DIFFERENTIAL/PLATELET
Basophils Absolute: 0.1 10*3/uL (ref 0.0–0.2)
Basos: 1 %
EOS (ABSOLUTE): 0.2 10*3/uL (ref 0.0–0.4)
Eos: 2 %
Hematocrit: 53.1 % — ABNORMAL HIGH (ref 37.5–51.0)
Hemoglobin: 17.7 g/dL (ref 13.0–17.7)
Immature Grans (Abs): 0.1 10*3/uL (ref 0.0–0.1)
Immature Granulocytes: 1 %
Lymphocytes Absolute: 3 10*3/uL (ref 0.7–3.1)
Lymphs: 26 %
MCH: 31 pg (ref 26.6–33.0)
MCHC: 33.3 g/dL (ref 31.5–35.7)
MCV: 93 fL (ref 79–97)
Monocytes Absolute: 1 10*3/uL — ABNORMAL HIGH (ref 0.1–0.9)
Monocytes: 9 %
Neutrophils Absolute: 7.2 10*3/uL — ABNORMAL HIGH (ref 1.4–7.0)
Neutrophils: 61 %
Platelets: 200 10*3/uL (ref 150–450)
RBC: 5.71 x10E6/uL (ref 4.14–5.80)
RDW: 14 % (ref 11.6–15.4)
WBC: 11.6 10*3/uL — ABNORMAL HIGH (ref 3.4–10.8)

## 2021-01-04 LAB — COMPREHENSIVE METABOLIC PANEL
ALT: 67 IU/L — ABNORMAL HIGH (ref 0–44)
AST: 76 IU/L — ABNORMAL HIGH (ref 0–40)
Albumin/Globulin Ratio: 1.5 (ref 1.2–2.2)
Albumin: 4.6 g/dL (ref 3.8–4.9)
Alkaline Phosphatase: 132 IU/L — ABNORMAL HIGH (ref 44–121)
BUN/Creatinine Ratio: 11 (ref 9–20)
BUN: 13 mg/dL (ref 6–24)
Bilirubin Total: 0.7 mg/dL (ref 0.0–1.2)
CO2: 22 mmol/L (ref 20–29)
Calcium: 10.3 mg/dL — ABNORMAL HIGH (ref 8.7–10.2)
Chloride: 93 mmol/L — ABNORMAL LOW (ref 96–106)
Creatinine, Ser: 1.14 mg/dL (ref 0.76–1.27)
Globulin, Total: 3 g/dL (ref 1.5–4.5)
Glucose: 416 mg/dL — ABNORMAL HIGH (ref 70–99)
Potassium: 4.3 mmol/L (ref 3.5–5.2)
Sodium: 134 mmol/L (ref 134–144)
Total Protein: 7.6 g/dL (ref 6.0–8.5)
eGFR: 75 mL/min/{1.73_m2} (ref >59)

## 2021-01-04 LAB — LIPASE: Lipase: 27 U/L (ref 13–78)

## 2021-01-05 ENCOUNTER — Other Ambulatory Visit: Payer: Self-pay | Admitting: Nurse Practitioner

## 2021-01-05 DIAGNOSIS — G6289 Other specified polyneuropathies: Secondary | ICD-10-CM

## 2021-01-05 NOTE — Telephone Encounter (Signed)
Pharmacy requesting 90 day refills Last RF 11/28/20 #90 2 RF  Requested Prescriptions  Pending Prescriptions Disp Refills   nortriptyline (PAMELOR) 10 MG capsule [Pharmacy Med Name: NORTRIPTYLINE HCL 10 MG CAP] 270 capsule 1    Sig: TAKE 1 CAPSULE BY MOUTH DAILY AND TAKE 2 CAPSULES BY MOUTH AT BEDTIME FOR NEUROPATHY     Psychiatry:  Antidepressants - Heterocyclics (TCAs) Passed - 01/05/2021  9:43 AM      Passed - Valid encounter within last 6 months    Recent Outpatient Visits           2 days ago Elevated glucose   [facility] [place], Marzella Schlein, [place]   2 months ago Hospital discharge follow-up   [facility] Claiborne Rigg, NP   5 months ago Elevated glucose   [facility] [place], Shea Stakes, NP   1 year ago Encounter to establish care   [facility] [place], Shea Stakes, NP       Future Appointments             In 1 month Raulkar, Drema Pry, MD [facility] Rehabilitation, [place]

## 2021-01-09 DIAGNOSIS — R197 Diarrhea, unspecified: Secondary | ICD-10-CM | POA: Diagnosis not present

## 2021-01-09 DIAGNOSIS — R109 Unspecified abdominal pain: Secondary | ICD-10-CM | POA: Diagnosis not present

## 2021-01-10 ENCOUNTER — Telehealth (INDEPENDENT_AMBULATORY_CARE_PROVIDER_SITE_OTHER): Payer: Self-pay

## 2021-01-10 ENCOUNTER — Other Ambulatory Visit: Payer: Self-pay | Admitting: Nurse Practitioner

## 2021-01-10 ENCOUNTER — Ambulatory Visit: Payer: Self-pay

## 2021-01-10 MED ORDER — PROMETHAZINE HCL 25 MG RE SUPP: 25.0000 mg | Freq: Four times a day (QID) | RECTAL | 0 refills | Status: DC | PRN

## 2021-01-10 NOTE — Telephone Encounter (Signed)
Pt. Reports he has had chronic vomiting and started Reglan. Continues to vomit 2-3 times a day. Afraid he is losing "more weight.The Reglan hasn't really helped." No signs of dehydration. Requesting to see PCP again. Please advise.    Answer Assessment - Initial Assessment Questions 1. VOMITING SEVERITY: "How many times have you vomited in the past 24 hours?"     - MILD:  1 - 2 times/day    - MODERATE: 3 - 5 times/day, decreased oral intake without significant weight loss or symptoms of dehydration    - SEVERE: 6 or more times/day, vomits everything or nearly everything, with significant weight loss, symptoms of dehydration      2-3 2. ONSET: "When did the vomiting begin?"      Last got worse 3. FLUIDS: "What fluids or food have you vomited up today?" "Have you been able to keep any fluids down?"     Yes 4. ABDOMINAL PAIN: "Are your having any abdominal pain?" If yes : "How bad is it and what does it feel like?" (e.g., crampy, dull, intermittent, constant)      Nausea 5. DIARRHEA: "Is there any diarrhea?" If Yes, ask: "How many times today?"      No 6. CONTACTS: "Is there anyone else in the family with the same symptoms?"      No 7. CAUSE: "What do you think is causing your vomiting?"     Maybe medication 8. HYDRATION STATUS: "Any signs of dehydration?" (e.g., dry mouth [not only dry lips], too weak to stand) "When did you last urinate?"     Thinks he has lost weight 9. OTHER SYMPTOMS: "Do you have any other symptoms?" (e.g., fever, headache, vertigo, vomiting blood or coffee grounds, recent head injury)     No 10. PREGNANCY: "Is there any chance you are pregnant?" "When was your last menstrual period?"       N/a  Protocols used: Vomiting-A-AH

## 2021-01-10 NOTE — Telephone Encounter (Signed)
I do not have any openings. If he can't keep anything down he needs to go to the ER or urgent care. I have sent phenergan suppositories for rectal use to the pharmacy.

## 2021-01-10 NOTE — Telephone Encounter (Signed)
Spoke to Pt and said a Rx was just sent in he is going to try that. He has been taking his medicine  but un able to keep anything down.

## 2021-01-10 NOTE — Telephone Encounter (Signed)
Copied from CRM [id number]. Topic: General - Other >> Jan 07, 2021  3:44 PM Traci Sermon wrote: Reason for CRM: Pt called in wanting to let the nurse know, the one he'd been speaking with that his sugar levels are in the high 200  Please advise

## 2021-01-11 NOTE — Telephone Encounter (Signed)
Called pt unable to reach left NP zelda below message. Call back nr and contact provided

## 2021-01-11 NOTE — Telephone Encounter (Signed)
Pt called back saying he is still having the nausea with the suppository.  Please advise  [phone]

## 2021-01-15 DIAGNOSIS — Z1211 Encounter for screening for malignant neoplasm of colon: Secondary | ICD-10-CM | POA: Diagnosis not present

## 2021-01-15 DIAGNOSIS — R945 Abnormal results of liver function studies: Secondary | ICD-10-CM | POA: Diagnosis not present

## 2021-01-15 DIAGNOSIS — Z8719 Personal history of other diseases of the digestive system: Secondary | ICD-10-CM | POA: Diagnosis not present

## 2021-01-15 DIAGNOSIS — K760 Fatty (change of) liver, not elsewhere classified: Secondary | ICD-10-CM | POA: Diagnosis not present

## 2021-01-21 NOTE — Telephone Encounter (Signed)
Spoke to pt he says since Thursday he has been keeping everything thing down.

## 2021-01-23 ENCOUNTER — Ambulatory Visit: Payer: Medicaid Other | Admitting: Nurse Practitioner

## 2021-01-31 DIAGNOSIS — M792 Neuralgia and neuritis, unspecified: Secondary | ICD-10-CM | POA: Diagnosis not present

## 2021-01-31 DIAGNOSIS — Z8719 Personal history of other diseases of the digestive system: Secondary | ICD-10-CM | POA: Diagnosis not present

## 2021-01-31 DIAGNOSIS — Z79899 Other long term (current) drug therapy: Secondary | ICD-10-CM | POA: Diagnosis not present

## 2021-01-31 DIAGNOSIS — R110 Nausea: Secondary | ICD-10-CM | POA: Diagnosis not present

## 2021-01-31 DIAGNOSIS — E119 Type 2 diabetes mellitus without complications: Secondary | ICD-10-CM | POA: Diagnosis not present

## 2021-02-05 ENCOUNTER — Ambulatory Visit: Payer: Medicaid Other | Admitting: Neurology

## 2021-02-05 DIAGNOSIS — K625 Hemorrhage of anus and rectum: Secondary | ICD-10-CM | POA: Diagnosis not present

## 2021-02-05 DIAGNOSIS — E119 Type 2 diabetes mellitus without complications: Secondary | ICD-10-CM | POA: Diagnosis not present

## 2021-02-05 DIAGNOSIS — K635 Polyp of colon: Secondary | ICD-10-CM | POA: Diagnosis not present

## 2021-02-05 DIAGNOSIS — Z1211 Encounter for screening for malignant neoplasm of colon: Secondary | ICD-10-CM | POA: Diagnosis not present

## 2021-02-08 ENCOUNTER — Other Ambulatory Visit: Payer: Self-pay | Admitting: Nurse Practitioner

## 2021-02-08 ENCOUNTER — Encounter: Payer: Medicaid Other | Admitting: Physical Medicine and Rehabilitation

## 2021-02-11 DIAGNOSIS — K529 Noninfective gastroenteritis and colitis, unspecified: Secondary | ICD-10-CM | POA: Diagnosis not present

## 2021-02-18 ENCOUNTER — Ambulatory Visit: Payer: Medicaid Other | Admitting: Neurology

## 2021-02-27 DIAGNOSIS — Z86010 Personal history of colonic polyps: Secondary | ICD-10-CM | POA: Diagnosis not present

## 2021-02-27 DIAGNOSIS — K7010 Alcoholic hepatitis without ascites: Secondary | ICD-10-CM | POA: Diagnosis not present

## 2021-03-12 DIAGNOSIS — Z79899 Other long term (current) drug therapy: Secondary | ICD-10-CM | POA: Diagnosis not present

## 2021-03-12 DIAGNOSIS — E559 Vitamin D deficiency, unspecified: Secondary | ICD-10-CM | POA: Diagnosis not present

## 2021-03-12 DIAGNOSIS — E119 Type 2 diabetes mellitus without complications: Secondary | ICD-10-CM | POA: Diagnosis not present

## 2021-03-12 DIAGNOSIS — Z8719 Personal history of other diseases of the digestive system: Secondary | ICD-10-CM | POA: Diagnosis not present

## 2021-03-13 DIAGNOSIS — R112 Nausea with vomiting, unspecified: Secondary | ICD-10-CM | POA: Diagnosis not present

## 2021-03-13 DIAGNOSIS — Z8719 Personal history of other diseases of the digestive system: Secondary | ICD-10-CM | POA: Diagnosis not present

## 2021-03-13 DIAGNOSIS — K529 Noninfective gastroenteritis and colitis, unspecified: Secondary | ICD-10-CM | POA: Diagnosis not present

## 2021-03-13 DIAGNOSIS — K219 Gastro-esophageal reflux disease without esophagitis: Secondary | ICD-10-CM | POA: Diagnosis not present

## 2021-03-19 ENCOUNTER — Telehealth: Payer: Self-pay | Admitting: *Deleted

## 2021-03-19 ENCOUNTER — Other Ambulatory Visit: Payer: Self-pay | Admitting: Physician Assistant

## 2021-03-19 DIAGNOSIS — E119 Type 2 diabetes mellitus without complications: Secondary | ICD-10-CM | POA: Diagnosis not present

## 2021-03-19 DIAGNOSIS — R112 Nausea with vomiting, unspecified: Secondary | ICD-10-CM | POA: Diagnosis not present

## 2021-03-19 DIAGNOSIS — E1165 Type 2 diabetes mellitus with hyperglycemia: Secondary | ICD-10-CM

## 2021-03-19 DIAGNOSIS — Z01818 Encounter for other preprocedural examination: Secondary | ICD-10-CM | POA: Diagnosis not present

## 2021-03-19 MED ORDER — METFORMIN HCL ER 500 MG PO TB24: 500.0000 mg | ORAL_TABLET | Freq: Two times a day (BID) | ORAL | 0 refills | Status: DC

## 2021-03-19 MED ORDER — GLIPIZIDE 5 MG PO TABS: 5.0000 mg | ORAL_TABLET | Freq: Every day | ORAL | 0 refills | Status: DC

## 2021-03-19 NOTE — Telephone Encounter (Signed)
Patient is requesting RF: Metformin XR 500 mg bid- listed as historical  Glipizide 5 mg daily ( not on list- but included in chart note -November)  [facility] is current pharmacy- patient states he is completely out of medication.

## 2021-03-19 NOTE — Telephone Encounter (Signed)
Rxs for metformin, glipizide sent.

## 2021-03-19 NOTE — Addendum Note (Signed)
Addended by: Lois Huxley, Jeannett Senior L on: 03/19/2021 04:27 PM   Modules accepted: Orders

## 2021-03-19 NOTE — Telephone Encounter (Signed)
Patient states he is completely out of his medications- an appointment has been scheduled 03/21/21 for medication follow up.

## 2021-03-19 NOTE — Telephone Encounter (Signed)
Receipt confirmed by pharmacy 03/19/10  at 1627  Requested Prescriptions  Pending Prescriptions Disp Refills   glipiZIDE (GLUCOTROL) 5 MG tablet [Pharmacy Med Name: GLIPIZIDE 5 MG TABLET] 90 tablet 1    Sig: TAKE 1 TABLET BY MOUTH DAILY BEFORE BREAKFAST.     Endocrinology:  Diabetes - Sulfonylureas Passed - 03/19/2021  4:28 PM      Passed - HBA1C is between 0 and 7.9 and within 180 days    Hgb A1c MFr Bld  Date Value Ref Range Status  12/01/2020 6.7 (H) 4.8 - 5.6 % Final    Comment:    (NOTE) Pre diabetes:          5.7%-6.4%  Diabetes:              >6.4%  Glycemic control for   <7.0% adults with diabetes          Passed - Valid encounter within last 6 months    Recent Outpatient Visits          2 months ago Elevated glucose   [facility] [place], [place], [place]   5 months ago Hospital discharge follow-up   [facility] Claiborne Rigg, NP   8 months ago Elevated glucose   [facility] [place], Shea Stakes, NP   1 year ago Encounter to establish care   [facility] [place], Shea Stakes, NP      Future Appointments            In 2 days Sharon Seller, Marzella Schlein, PA-C [facility]   In 4 months Raulkar, Drema Pry, MD [facility] Physical Medicine and Rehabilitation, [place]

## 2021-03-21 ENCOUNTER — Encounter (HOSPITAL_COMMUNITY): Payer: Self-pay | Admitting: Emergency Medicine

## 2021-03-21 ENCOUNTER — Other Ambulatory Visit: Payer: Self-pay | Admitting: Physician Assistant

## 2021-03-21 ENCOUNTER — Encounter: Payer: Self-pay | Admitting: Physician Assistant

## 2021-03-21 ENCOUNTER — Observation Stay (HOSPITAL_COMMUNITY)
Admission: EM | Admit: 2021-03-21 | Discharge: 2021-03-24 | Disposition: A | Discharge status: Home / Self Care | Payer: Medicaid Other | Attending: Internal Medicine | Admitting: Internal Medicine

## 2021-03-21 ENCOUNTER — Ambulatory Visit: Payer: Medicaid Other | Attending: Physician Assistant | Admitting: Physician Assistant

## 2021-03-21 ENCOUNTER — Other Ambulatory Visit: Payer: Self-pay

## 2021-03-21 ENCOUNTER — Emergency Department (HOSPITAL_COMMUNITY): Payer: Medicaid Other

## 2021-03-21 VITALS — BP 124/89 | HR 92 | Resp 16 | Wt 229.2 lb

## 2021-03-21 DIAGNOSIS — Z79899 Other long term (current) drug therapy: Secondary | ICD-10-CM | POA: Diagnosis not present

## 2021-03-21 DIAGNOSIS — E1110 Type 2 diabetes mellitus with ketoacidosis without coma: Secondary | ICD-10-CM | POA: Diagnosis not present

## 2021-03-21 DIAGNOSIS — G6181 Chronic inflammatory demyelinating polyneuritis: Secondary | ICD-10-CM | POA: Diagnosis present

## 2021-03-21 DIAGNOSIS — I700 Atherosclerosis of aorta: Secondary | ICD-10-CM | POA: Diagnosis not present

## 2021-03-21 DIAGNOSIS — K861 Other chronic pancreatitis: Secondary | ICD-10-CM | POA: Diagnosis not present

## 2021-03-21 DIAGNOSIS — K3184 Gastroparesis: Secondary | ICD-10-CM | POA: Diagnosis present

## 2021-03-21 DIAGNOSIS — G894 Chronic pain syndrome: Secondary | ICD-10-CM | POA: Diagnosis present

## 2021-03-21 DIAGNOSIS — Z20822 Contact with and (suspected) exposure to covid-19: Secondary | ICD-10-CM | POA: Diagnosis not present

## 2021-03-21 DIAGNOSIS — Z7901 Long term (current) use of anticoagulants: Secondary | ICD-10-CM | POA: Insufficient documentation

## 2021-03-21 DIAGNOSIS — R739 Hyperglycemia, unspecified: Secondary | ICD-10-CM | POA: Diagnosis not present

## 2021-03-21 DIAGNOSIS — E1142 Type 2 diabetes mellitus with diabetic polyneuropathy: Secondary | ICD-10-CM | POA: Diagnosis present

## 2021-03-21 DIAGNOSIS — E119 Type 2 diabetes mellitus without complications: Secondary | ICD-10-CM

## 2021-03-21 DIAGNOSIS — G609 Hereditary and idiopathic neuropathy, unspecified: Secondary | ICD-10-CM | POA: Diagnosis present

## 2021-03-21 DIAGNOSIS — R109 Unspecified abdominal pain: Secondary | ICD-10-CM | POA: Diagnosis not present

## 2021-03-21 DIAGNOSIS — E440 Moderate protein-calorie malnutrition: Secondary | ICD-10-CM | POA: Diagnosis present

## 2021-03-21 DIAGNOSIS — Z91199 Patient's noncompliance with other medical treatment and regimen due to unspecified reason: Secondary | ICD-10-CM | POA: Diagnosis not present

## 2021-03-21 DIAGNOSIS — Z91148 Patient's other noncompliance with medication regimen for other reason: Secondary | ICD-10-CM

## 2021-03-21 DIAGNOSIS — Z9114 Patient's other noncompliance with medication regimen: Secondary | ICD-10-CM

## 2021-03-21 DIAGNOSIS — Z794 Long term (current) use of insulin: Secondary | ICD-10-CM | POA: Diagnosis not present

## 2021-03-21 DIAGNOSIS — Z7984 Long term (current) use of oral hypoglycemic drugs: Secondary | ICD-10-CM | POA: Diagnosis not present

## 2021-03-21 DIAGNOSIS — E1165 Type 2 diabetes mellitus with hyperglycemia: Secondary | ICD-10-CM

## 2021-03-21 DIAGNOSIS — R9431 Abnormal electrocardiogram [ECG] [EKG]: Secondary | ICD-10-CM | POA: Diagnosis not present

## 2021-03-21 DIAGNOSIS — F17210 Nicotine dependence, cigarettes, uncomplicated: Secondary | ICD-10-CM | POA: Diagnosis not present

## 2021-03-21 DIAGNOSIS — E785 Hyperlipidemia, unspecified: Secondary | ICD-10-CM | POA: Diagnosis present

## 2021-03-21 DIAGNOSIS — E669 Obesity, unspecified: Secondary | ICD-10-CM | POA: Diagnosis present

## 2021-03-21 DIAGNOSIS — E66811 Obesity, class 1: Secondary | ICD-10-CM | POA: Diagnosis present

## 2021-03-21 DIAGNOSIS — E1143 Type 2 diabetes mellitus with diabetic autonomic (poly)neuropathy: Secondary | ICD-10-CM | POA: Diagnosis present

## 2021-03-21 DIAGNOSIS — I10 Essential (primary) hypertension: Secondary | ICD-10-CM | POA: Diagnosis not present

## 2021-03-21 DIAGNOSIS — Z86711 Personal history of pulmonary embolism: Secondary | ICD-10-CM | POA: Diagnosis present

## 2021-03-21 LAB — BLOOD GAS, VENOUS
Acid-base deficit: 5.8 mmol/L — ABNORMAL HIGH (ref 0.0–2.0)
Bicarbonate: 18 mmol/L — ABNORMAL LOW (ref 20.0–28.0)
FIO2: 21
O2 Saturation: 77.9 %
Patient temperature: 98.6
pCO2, Ven: 32.4 mmHg — ABNORMAL LOW (ref 44.0–60.0)
pH, Ven: 7.363 (ref 7.250–7.430)
pO2, Ven: 43.5 mmHg (ref 32.0–45.0)

## 2021-03-21 LAB — COMPREHENSIVE METABOLIC PANEL
ALT: 14 U/L (ref 0–44)
AST: 18 U/L (ref 15–41)
Albumin: 3.8 g/dL (ref 3.5–5.0)
Alkaline Phosphatase: 88 U/L (ref 38–126)
Anion gap: 15 (ref 5–15)
BUN: 10 mg/dL (ref 6–20)
CO2: 17 mmol/L — ABNORMAL LOW (ref 22–32)
Calcium: 8.8 mg/dL — ABNORMAL LOW (ref 8.9–10.3)
Chloride: 96 mmol/L — ABNORMAL LOW (ref 98–111)
Creatinine, Ser: 0.93 mg/dL (ref 0.61–1.24)
GFR, Estimated: >60 mL/min (ref >60)
Glucose, Bld: 596 mg/dL (ref 70–99)
Potassium: 4.2 mmol/L (ref 3.5–5.1)
Sodium: 128 mmol/L — ABNORMAL LOW (ref 135–145)
Total Bilirubin: 1.7 mg/dL — ABNORMAL HIGH (ref 0.3–1.2)
Total Protein: 7.1 g/dL (ref 6.5–8.1)

## 2021-03-21 LAB — CBC WITH DIFFERENTIAL/PLATELET
Abs Immature Granulocytes: 0.09 10*3/uL — ABNORMAL HIGH (ref 0.00–0.07)
Basophils Absolute: 0.1 10*3/uL (ref 0.0–0.1)
Basophils Relative: 1 %
Eosinophils Absolute: 0.1 10*3/uL (ref 0.0–0.5)
Eosinophils Relative: 1 %
HCT: 46.9 % (ref 39.0–52.0)
Hemoglobin: 16.6 g/dL (ref 13.0–17.0)
Immature Granulocytes: 1 %
Lymphocytes Relative: 33 %
Lymphs Abs: 3.8 10*3/uL (ref 0.7–4.0)
MCH: 32 pg (ref 26.0–34.0)
MCHC: 35.4 g/dL (ref 30.0–36.0)
MCV: 90.4 fL (ref 80.0–100.0)
Monocytes Absolute: 1 10*3/uL (ref 0.1–1.0)
Monocytes Relative: 8 %
Neutro Abs: 6.3 10*3/uL (ref 1.7–7.7)
Neutrophils Relative %: 56 %
Platelets: 258 10*3/uL (ref 150–400)
RBC: 5.19 MIL/uL (ref 4.22–5.81)
RDW: 14.2 % (ref 11.5–15.5)
WBC: 11.3 10*3/uL — ABNORMAL HIGH (ref 4.0–10.5)
nRBC: 0 % (ref 0.0–0.2)

## 2021-03-21 LAB — BASIC METABOLIC PANEL
Anion gap: 12 (ref 5–15)
BUN: 9 mg/dL (ref 6–20)
CO2: 18 mmol/L — ABNORMAL LOW (ref 22–32)
Calcium: 8.9 mg/dL (ref 8.9–10.3)
Chloride: 104 mmol/L (ref 98–111)
Creatinine, Ser: 0.76 mg/dL (ref 0.61–1.24)
GFR, Estimated: >60 mL/min (ref >60)
Glucose, Bld: 153 mg/dL — ABNORMAL HIGH (ref 70–99)
Potassium: 3.9 mmol/L (ref 3.5–5.1)
Sodium: 134 mmol/L — ABNORMAL LOW (ref 135–145)

## 2021-03-21 LAB — CBG MONITORING, ED
Glucose-Capillary: >600 mg/dL (ref 70–99)
Glucose-Capillary: 594 mg/dL (ref 70–99)
Glucose-Capillary: 224 mg/dL — ABNORMAL HIGH (ref 70–99)
Glucose-Capillary: 357 mg/dL — ABNORMAL HIGH (ref 70–99)
Glucose-Capillary: 394 mg/dL — ABNORMAL HIGH (ref 70–99)
Glucose-Capillary: 124 mg/dL — ABNORMAL HIGH (ref 70–99)
Glucose-Capillary: 108 mg/dL — ABNORMAL HIGH (ref 70–99)
Glucose-Capillary: 177 mg/dL — ABNORMAL HIGH (ref 70–99)

## 2021-03-21 LAB — BETA-HYDROXYBUTYRIC ACID: Beta-Hydroxybutyric Acid: 3.91 mmol/L — ABNORMAL HIGH (ref 0.05–0.27)

## 2021-03-21 LAB — URINALYSIS, ROUTINE W REFLEX MICROSCOPIC
Bacteria, UA: NONE SEEN
Bilirubin Urine: NEGATIVE
Glucose, UA: >=500 mg/dL — AB
Hgb urine dipstick: NEGATIVE
Ketones, ur: 20 mg/dL — AB
Leukocytes,Ua: NEGATIVE
Nitrite: NEGATIVE
Protein, ur: NEGATIVE mg/dL
Specific Gravity, Urine: 1.03 (ref 1.005–1.030)
pH: 6 (ref 5.0–8.0)

## 2021-03-21 LAB — RESP PANEL BY RT-PCR (FLU A&B, COVID) ARPGX2
Influenza A by PCR: NEGATIVE
Influenza B by PCR: NEGATIVE
SARS Coronavirus 2 by RT PCR: NEGATIVE

## 2021-03-21 LAB — POCT GLYCOSYLATED HEMOGLOBIN (HGB A1C): HbA1c, POC (controlled diabetic range): 12.2 % — AB (ref 0.0–7.0)

## 2021-03-21 LAB — LIPASE, BLOOD: Lipase: 50 U/L (ref 11–51)

## 2021-03-21 LAB — ETHANOL: Alcohol, Ethyl (B): <10 mg/dL (ref <10)

## 2021-03-21 LAB — GLUCOSE, POCT (MANUAL RESULT ENTRY): POC Glucose: >600 mg/dl (ref 70–99)

## 2021-03-21 LAB — GLUCOSE, CAPILLARY: Glucose-Capillary: 158 mg/dL — ABNORMAL HIGH (ref 70–99)

## 2021-03-21 MED ORDER — INSULIN GLARGINE-YFGN 100 UNIT/ML ~~LOC~~ SOLN
10.0000 [IU] | Freq: Every day | SUBCUTANEOUS | Status: DC
  Administered 2021-03-22: 10 [IU] via SUBCUTANEOUS
  Filled 2021-03-21: qty 0.1

## 2021-03-21 MED ORDER — "PEN NEEDLES 5/16"" 31G X 8 MM MISC": 1.0000 | Freq: Every day | 1 refills | Status: DC

## 2021-03-21 MED ORDER — SUCRALFATE 1 G PO TABS
1.0000 g | ORAL_TABLET | Freq: Three times a day (TID) | ORAL | Status: DC
  Administered 2021-03-22 – 2021-03-24 (×9): 1 g via ORAL
  Filled 2021-03-21 (×9): qty 1

## 2021-03-21 MED ORDER — PANTOPRAZOLE SODIUM 40 MG PO TBEC
40.0000 mg | DELAYED_RELEASE_TABLET | Freq: Every day | ORAL | Status: DC
  Administered 2021-03-22 – 2021-03-24 (×3): 40 mg via ORAL
  Filled 2021-03-21 (×3): qty 1

## 2021-03-21 MED ORDER — DEXTROSE-NACL 5-0.45 % IV SOLN: INTRAVENOUS | Status: DC

## 2021-03-21 MED ORDER — NORTRIPTYLINE HCL 10 MG PO CAPS
10.0000 mg | ORAL_CAPSULE | Freq: Two times a day (BID) | ORAL | Status: DC
  Administered 2021-03-22 – 2021-03-24 (×5): 10 mg via ORAL
  Filled 2021-03-21 (×5): qty 1

## 2021-03-21 MED ORDER — ATORVASTATIN CALCIUM 40 MG PO TABS
40.0000 mg | ORAL_TABLET | Freq: Every day | ORAL | Status: DC
  Administered 2021-03-22 – 2021-03-23 (×2): 40 mg via ORAL
  Filled 2021-03-21 (×2): qty 1

## 2021-03-21 MED ORDER — INSULIN GLARGINE 100 UNITS/ML SOLOSTAR PEN: 10.0000 [IU] | PEN_INJECTOR | Freq: Every day | SUBCUTANEOUS | 11 refills | Status: DC

## 2021-03-21 MED ORDER — PANCRELIPASE (LIP-PROT-AMYL) 12000-38000 UNITS PO CPEP
24000.0000 [IU] | ORAL_CAPSULE | Freq: Three times a day (TID) | ORAL | Status: DC
  Administered 2021-03-22 – 2021-03-24 (×7): 24000 [IU] via ORAL
  Filled 2021-03-21 (×7): qty 2

## 2021-03-21 MED ORDER — SODIUM CHLORIDE 0.9 % IV SOLN: INTRAVENOUS | Status: DC

## 2021-03-21 MED ORDER — LACTATED RINGERS IV BOLUS
20.0000 mL/kg | Freq: Once | INTRAVENOUS | Status: AC
  Administered 2021-03-21: 2080 mL via INTRAVENOUS

## 2021-03-21 MED ORDER — DEXTROSE 50 % IV SOLN: 0.0000 mL | INTRAVENOUS | Status: DC | PRN

## 2021-03-21 MED ORDER — POTASSIUM CHLORIDE 10 MEQ/100ML IV SOLN
10.0000 meq | INTRAVENOUS | Status: AC
  Administered 2021-03-21 (×2): 10 meq via INTRAVENOUS
  Filled 2021-03-21 (×2): qty 100

## 2021-03-21 MED ORDER — POTASSIUM CHLORIDE 10 MEQ/100ML IV SOLN
10.0000 meq | INTRAVENOUS | Status: AC
  Administered 2021-03-21 – 2021-03-22 (×2): 10 meq via INTRAVENOUS
  Filled 2021-03-21 (×2): qty 100

## 2021-03-21 MED ORDER — DICYCLOMINE HCL 10 MG PO CAPS
10.0000 mg | ORAL_CAPSULE | Freq: Three times a day (TID) | ORAL | Status: DC
  Administered 2021-03-22 – 2021-03-24 (×7): 10 mg via ORAL
  Filled 2021-03-21 (×8): qty 1

## 2021-03-21 MED ORDER — DIPHENHYDRAMINE HCL 25 MG PO CAPS
25.0000 mg | ORAL_CAPSULE | Freq: Every evening | ORAL | Status: DC | PRN
  Administered 2021-03-23: 25 mg via ORAL
  Filled 2021-03-21: qty 1

## 2021-03-21 MED ORDER — METOCLOPRAMIDE HCL 5 MG PO TABS
5.0000 mg | ORAL_TABLET | Freq: Three times a day (TID) | ORAL | Status: DC | PRN
  Administered 2021-03-23: 5 mg via ORAL
  Filled 2021-03-21: qty 1

## 2021-03-21 MED ORDER — INSULIN ASPART 100 UNIT/ML IJ SOLN
0.0000 [IU] | INTRAMUSCULAR | Status: DC
  Administered 2021-03-22: 4 [IU] via SUBCUTANEOUS
  Administered 2021-03-22: 11 [IU] via SUBCUTANEOUS
  Administered 2021-03-22 (×2): 4 [IU] via SUBCUTANEOUS

## 2021-03-21 MED ORDER — GLIPIZIDE 5 MG PO TABS: 5.0000 mg | ORAL_TABLET | Freq: Every day | ORAL | 3 refills | Status: DC

## 2021-03-21 MED ORDER — ENOXAPARIN SODIUM 40 MG/0.4ML IJ SOSY: 40.0000 mg | PREFILLED_SYRINGE | INTRAMUSCULAR | Status: DC

## 2021-03-21 MED ORDER — INSULIN GLARGINE SOLOSTAR 100 UNIT/ML ~~LOC~~ SOPN: 10.0000 [IU] | PEN_INJECTOR | Freq: Every day | SUBCUTANEOUS | 3 refills | Status: DC

## 2021-03-21 MED ORDER — HYDROMORPHONE HCL 1 MG/ML IJ SOLN
1.0000 mg | Freq: Once | INTRAMUSCULAR | Status: AC
  Administered 2021-03-21: 1 mg via INTRAVENOUS
  Filled 2021-03-21: qty 1

## 2021-03-21 MED ORDER — ONDANSETRON 4 MG PO TBDP
4.0000 mg | ORAL_TABLET | Freq: Three times a day (TID) | ORAL | Status: DC | PRN
  Administered 2021-03-22 – 2021-03-24 (×2): 4 mg via ORAL
  Filled 2021-03-21 (×2): qty 1

## 2021-03-21 MED ORDER — INSULIN REGULAR(HUMAN) IN NACL 100-0.9 UT/100ML-% IV SOLN: INTRAVENOUS | Status: DC

## 2021-03-21 MED ORDER — METFORMIN HCL ER 500 MG PO TB24: 500.0000 mg | ORAL_TABLET | Freq: Two times a day (BID) | ORAL | 2 refills | Status: DC

## 2021-03-21 MED ORDER — METFORMIN HCL ER 500 MG PO TB24: 500.0000 mg | ORAL_TABLET | Freq: Two times a day (BID) | ORAL | Status: DC

## 2021-03-21 MED ORDER — APIXABAN 5 MG PO TABS
5.0000 mg | ORAL_TABLET | Freq: Two times a day (BID) | ORAL | Status: DC
  Administered 2021-03-21 – 2021-03-24 (×6): 5 mg via ORAL
  Filled 2021-03-21 (×6): qty 1

## 2021-03-21 MED ORDER — IOHEXOL 300 MG/ML  SOLN
100.0000 mL | Freq: Once | INTRAMUSCULAR | Status: AC | PRN
  Administered 2021-03-21: 100 mL via INTRAVENOUS

## 2021-03-21 MED ORDER — INSULIN REGULAR(HUMAN) IN NACL 100-0.9 UT/100ML-% IV SOLN
INTRAVENOUS | Status: DC
  Administered 2021-03-21: 19 [IU]/h via INTRAVENOUS
  Filled 2021-03-21: qty 100

## 2021-03-21 MED ORDER — OXYCODONE HCL 5 MG PO TABS
10.0000 mg | ORAL_TABLET | Freq: Four times a day (QID) | ORAL | Status: DC | PRN
  Administered 2021-03-21 – 2021-03-24 (×8): 10 mg via ORAL
  Filled 2021-03-21 (×9): qty 2

## 2021-03-21 NOTE — Progress Notes (Signed)
Patient ID: Bryce Mendoza, male   DOB: [birth year], 61 y.o.   MRN: [id number]     Bryce Mendoza, is Mendoza 61 y.o. male  DJT:[id number]  [id number]  DOB - 02/24/1961  Chief Complaint  Patient presents with   Diabetes       Subjective:   Bryce Mendoza is Mendoza 61 y.o. male here today for med RF.  He says he has only been out of glipizide and metformin Mendoza "couple of days."  He did not follow-up as recommended after last visit.  Says when he checks his blood sugars at home, they are ~180.  He never started lantus.  H/o poor compliance. +polyuria, +polydipsia.  A1C has gone from 6.7 to 12.2  Patient has No headache, No chest pain, No abdominal pain - No Nausea, No new weakness tingling or numbness, No Cough - SOB.  No problems updated.  ALLERGIES: No Known Allergies  PAST MEDICAL HISTORY: Past Medical History:  Diagnosis Date   Ascending paralysis ([place]) 06/2019   DVT (deep venous thrombosis) ([place]) 09/09/2017   Empyema lung ([place])    Pulmonary embolism ([place]) 09/08/2017    MEDICATIONS AT HOME: Prior to Admission medications   Medication Sig Start Date End Date Taking? Authorizing Provider  Accu-Chek Softclix Lancets lancets Use as instructed 01/03/21   Argentina Donovan, PA-C  apixaban (ELIQUIS) 5 MG TABS tablet Take 1 tablet (5 mg total) by mouth 2 (two) times daily. 07/11/20   Bryce Pounds, NP  atorvastatin (LIPITOR) 40 MG tablet Take 1 tablet (40 mg total) by mouth daily. Patient not taking: Reported on 01/03/2021 07/17/20   Bryce Pounds, NP  azithromycin (AZASITE) 1 % ophthalmic solution Place 1 drop into the right eye 2 (two) times daily. For 10 days 12/14/20   Kathie Dike, MD  bisacodyl (DULCOLAX) 10 MG suppository Place 1 suppository (10 mg total) rectally daily as needed for moderate constipation. Patient not taking: Reported on 01/03/2021 12/14/20   Kathie Dike, MD  blood glucose meter kit and supplies KIT Dispense based on patient and insurance preference. Use up to four  times daily as directed. 10/09/20   Barb Merino, MD  Blood Glucose Monitoring Suppl (ACCU-CHEK GUIDE ME) w/Device KIT 1 each by Does not apply route 2 (two) times daily. 01/03/21   Argentina Donovan, PA-C  Continuous Blood Gluc Receiver (FREESTYLE LIBRE 14 DAY READER) DEVI Use as instructed. Check blood glucose level by fingerstick 3times per day. E11.65 G61.81 G60.9 Patient not taking: Reported on 01/03/2021 10/23/20   Bryce Pounds, NP  Continuous Blood Gluc Sensor (FREESTYLE LIBRE 14 DAY SENSOR) MISC Use as instructed. Check blood glucose level by fingerstick 3times per day. E11.65 G61.81 G60.9 Patient not taking: Reported on 01/03/2021 10/23/20   Bryce Pounds, NP  dicyclomine (BENTYL) 10 MG capsule Take 1 capsule (10 mg total) by mouth 3 (three) times daily before meals. 11/04/20 12/04/20  Barb Merino, MD  diphenhydrAMINE (BENADRYL) 25 mg capsule Take 25 mg by mouth at bedtime as needed for sleep.    [provider]  glipiZIDE (GLUCOTROL) 5 MG tablet Take 1 tablet (5 mg total) by mouth daily before breakfast. 03/21/21   Argentina Donovan, PA-C  glucose blood (ACCU-CHEK GUIDE) test strip Check blood sugar once daily. 01/03/21   Argentina Donovan, PA-C  HYDROcodone-acetaminophen (NORCO/VICODIN) 5-325 MG tablet Take 1 tablet by mouth every 4 (four) hours as needed. Patient not taking: Reported on 01/03/2021 12/23/20   Bryce Dove A, DO  insulin  glargine (LANTUS) 100 unit/mL SOPN Inject 10 Units into the skin daily. 03/21/21   Argentina Donovan, PA-C  Insulin Pen Needle (PEN NEEDLES 31GX5/16") 31G X 8 MM MISC 1 each by Does not apply route daily. 03/21/21   Argentina Donovan, PA-C  lipase/protease/amylase (CREON) 12000-38000 units CPEP capsule Take 2 capsules (24,000 Units total) by mouth 3 (three) times daily before meals. 12/14/20   Kathie Dike, MD  metFORMIN (GLUCOPHAGE-XR) 500 MG 24 hr tablet Take 1 tablet (500 mg total) by mouth in the morning and at bedtime. 03/21/21   Argentina Donovan, PA-C  metoCLOPramide (REGLAN) 5 MG tablet Take 1 tablet (5 mg total) by mouth every 8 (eight) hours as needed for nausea. 01/03/21 01/03/22  Argentina Donovan, PA-C  Multiple Vitamin (MULTIVITAMIN) tablet Take 1 tablet by mouth daily.    [provider]  nortriptyline (PAMELOR) 10 MG capsule TAKE 1 CAPSULE BY MOUTH DAILY AND TAKE 2 CAPSULES BY MOUTH AT BEDTIME FOR NEUROPATHY 01/07/21   Charlott Rakes, MD  ondansetron (ZOFRAN ODT) 4 MG disintegrating tablet Take 1 tablet (4 mg total) by mouth every 8 (eight) hours as needed for nausea or vomiting. 12/23/20   Jeanell Sparrow, DO  ondansetron (ZOFRAN) 4 MG tablet Take 1 tablet (4 mg total) by mouth every 8 (eight) hours as needed for nausea or vomiting. 11/17/20 11/17/21  Dwyane Dee, MD  Oxycodone HCl 10 MG TABS Take 1 tablet (10 mg total) by mouth 4 (four) times daily as needed (pain). 12/14/20   Kathie Dike, MD  pantoprazole (PROTONIX) 40 MG tablet Take 40 mg by mouth daily.    [provider]  pantoprazole (PROTONIX) 40 MG tablet Take 1 tablet (40 mg total) by mouth daily for 14 days. 12/23/20 01/06/21  Jeanell Sparrow, DO  polyethylene glycol (MIRALAX / GLYCOLAX) 17 g packet Take 17 g by mouth 2 (two) times daily. Patient not taking: Reported on 01/03/2021 12/14/20   Kathie Dike, MD  promethazine (PHENERGAN) 25 MG suppository PLACE 1 SUPPOSITORY (25 MG TOTAL) RECTALLY EVERY 6 HOURS AS NEEDED FOR NAUSEA AND VOMITING 02/08/21   Charlott Rakes, MD  sucralfate (CARAFATE) 1 g tablet Take 1 tablet (1 g total) by mouth 4 (four) times daily -  with meals and at bedtime for 7 days. 12/23/20 12/30/20  Jeanell Sparrow, DO  traZODone (DESYREL) 50 MG tablet Take 1-2 tablets (50-100 mg total) by mouth at bedtime as needed for sleep. Patient not taking: Reported on 01/03/2021 10/12/20 01/10/21  Bryce Pounds, NP    ROS: Neg HEENT Neg resp Neg cardiac Neg GI Neg GU Neg MS Neg psych Neg neuro  Objective:   Vitals:   03/21/21  1436  BP: 124/89  Pulse: 92  Resp: 16  SpO2: 97%  Weight: 229 lb 3.2 oz (104 kg)   Exam General appearance : Awake, seems groggy, not in any distress. Speech Clear. Appears pale and weak HEENT: Atraumatic and Normocephalic Neck: Supple, no JVD. No cervical lymphadenopathy.  Chest: Good air entry bilaterally, CTAB.  No rales/rhonchi/wheezing CVS: S1 S2 regular, no murmurs.  Extremities: B/L Lower Ext shows no edema, both legs are warm to touch Neurology: Awake alert, and oriented X 3, CN II-XII intact, Non focal Skin: No Rash  Data Review Lab Results  Component Value Date   HGBA1C 12.2 ([place]) 03/21/2021   HGBA1C 6.7 (H) 12/01/2020   HGBA1C 9.4 (H) 09/18/2020    Assessment & Plan   1. Type 2 diabetes  mellitus with hyperglycemia, unspecified whether long term insulin use ([place]) EMS called for transport bc glucose >600.  Unable to obtain urine - Glucose (CBG) - HgB A1c - metFORMIN (GLUCOPHAGE-XR) 500 MG 24 hr tablet; Take 1 tablet (500 mg total) by mouth in the morning and at bedtime.  Dispense: 60 tablet; Refill: 2 - insulin glargine (LANTUS) 100 unit/mL SOPN; Inject 10 Units into the skin daily.  Dispense: 15 mL; Refill: 11 - Insulin Pen Needle (PEN NEEDLES 31GX5/16") 31G X 8 MM MISC; 1 each by Does not apply route daily.  Dispense: 100 each; Refill: 1 - glipiZIDE (GLUCOTROL) 5 MG tablet; Take 1 tablet (5 mg total) by mouth daily before breakfast.  Dispense: 30 tablet; Refill: 3  2. Chronic pancreatitis, unspecified pancreatitis type ([place])   3. Noncompliance To ED via EMS now-close follow up  Patient have been counseled extensively about nutrition and exercise. Other issues discussed during this visit include: low cholesterol diet, weight control and daily exercise, foot care, annual eye examinations at Ophthalmology, importance of adherence with medications and regular follow-up. We also discussed long term complications of uncontrolled diabetes and hypertension.   Return in  about 4 weeks (around 04/18/2021) for with PCP for hospital follow up and glycemic control.  The patient was given clear instructions to go to ER or return to medical center if symptoms don't improve, worsen or new problems develop. The patient verbalized understanding. The patient was told to call to get lab results if they haven't heard anything in the next week.      Freeman Caldron, PA-C [facility], [place]   03/21/2021, 2:46 PM
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
## 2021-03-21 NOTE — ED Provider Triage Note (Signed)
Emergency Medicine Provider Triage Evaluation Note  Bryce Mendoza , a 61 y.o. male  was evaluated in triage.  Pt complains of hyperglycemia.  Patient states that he is normally on glipizide and metformin.  He ran out of his glipizide 8 days ago and the metformin 4 days ago.  He is not on chronic insulin therapy.  He states that he has been feeling disoriented lately, off-balance, generalized weakness.  He has been urinating a lot and drinking a lot more.  He also states that he has 8 out of 10 abdominal pain in the center that radiates to his back.  He has no nausea and vomiting associated with this.  Denies diarrhea or fevers.  Of note, he did state that he fell last night because he felt wobbly.  He did not hit his head.  He has no associated injuries..   Review of Systems  Positive: Disoriented, balance issues, generalized weakness, abdominal pain, back pain, increased urination, increased thirst Negative:   Physical Exam  BP 137/87 (BP Location: Left Arm)    Pulse 86    Temp 98 F (36.7 C) (Oral)    Resp 16    SpO2 100%  Gen:   Awake, no distress   Resp:  Normal effort MSK:   Moves extremities without difficulty  Other:  Abdomen soft, generalized ttp.   Medical Decision Making  Medically screening exam initiated at 3:30 PM.  Appropriate orders placed.  Eryc Bodey was informed that the remainder of the evaluation will be completed by another provider, this initial triage assessment does not replace that evaluation, and the importance of remaining in the ED until their evaluation is complete.  Hyperglycemia and abdominal labs ordered.    Claudie Leach, [place] 03/21/21 1536

## 2021-03-21 NOTE — ED Provider Notes (Signed)
[facility] DEPT Provider Note   CSN: [id number] Arrival date & time: 03/21/21  1516     History  No chief complaint on file.   Bryce Mendoza is a 61 y.o. male.  Presenting to the emergency room with concern for hyperglycemia.  Patient reports that he has been off of his medications for the last few days.  States that he has been prescribed insulin but never started taking his insulin.  Over the last few days has not been taking his oral diabetes medications.  Has had increasing abdominal pain.  States he struggles with chronic abdominal pain related to his chronic pancreatitis.  Pain today is worse than normal.  Generalized, with some associated nausea.  States that he has had generalized weakness and fatigue as well.  Some polyuria and polydipsia.  He went to primary care office today and his glucose was elevated and he was sent to ER for further work-up.  Additional history obtained from review of primary care office, family medicine PA note.  Chronic pancreatitis, poorly controlled type 2 diabetes.    HPI     Home Medications Prior to Admission medications   Medication Sig Start Date End Date Taking? Authorizing Provider  Accu-Chek Softclix Lancets lancets Use as instructed 01/03/21   Argentina Donovan, PA-C  apixaban (ELIQUIS) 5 MG TABS tablet Take 1 tablet (5 mg total) by mouth 2 (two) times daily. 07/11/20   [name], NP  atorvastatin (LIPITOR) 40 MG tablet Take 1 tablet (40 mg total) by mouth daily. Patient not taking: Reported on 01/03/2021 07/17/20   [name], NP  azithromycin (AZASITE) 1 % ophthalmic solution Place 1 drop into the right eye 2 (two) times daily. For 10 days 12/14/20   Kathie Dike, MD  bisacodyl (DULCOLAX) 10 MG suppository Place 1 suppository (10 mg total) rectally daily as needed for moderate constipation. Patient not taking: Reported on 01/03/2021 12/14/20   Kathie Dike, MD  blood glucose meter kit and  supplies KIT Dispense based on patient and insurance preference. Use up to four times daily as directed. 10/09/20   Barb Merino, MD  Blood Glucose Monitoring Suppl (ACCU-CHEK GUIDE ME) w/Device KIT 1 each by Does not apply route 2 (two) times daily. 01/03/21   Argentina Donovan, PA-C  Continuous Blood Gluc Receiver (FREESTYLE LIBRE 14 DAY READER) DEVI Use as instructed. Check blood glucose level by fingerstick 3times per day. E11.65 G61.81 G60.9 Patient not taking: Reported on 01/03/2021 10/23/20   [name], NP  Continuous Blood Gluc Sensor (FREESTYLE LIBRE 14 DAY SENSOR) MISC Use as instructed. Check blood glucose level by fingerstick 3times per day. E11.65 G61.81 G60.9 Patient not taking: Reported on 01/03/2021 10/23/20   [name], NP  dicyclomine (BENTYL) 10 MG capsule Take 1 capsule (10 mg total) by mouth 3 (three) times daily before meals. 11/04/20 12/04/20  Barb Merino, MD  diphenhydrAMINE (BENADRYL) 25 mg capsule Take 25 mg by mouth at bedtime as needed for sleep.    [provider]  glipiZIDE (GLUCOTROL) 5 MG tablet Take 1 tablet (5 mg total) by mouth daily before breakfast. 03/21/21   Argentina Donovan, PA-C  glucose blood (ACCU-CHEK GUIDE) test strip Check blood sugar once daily. 01/03/21   Argentina Donovan, PA-C  HYDROcodone-acetaminophen (NORCO/VICODIN) 5-325 MG tablet Take 1 tablet by mouth every 4 (four) hours as needed. Patient not taking: Reported on 01/03/2021 12/23/20   Jeanell Sparrow, DO  Insulin Glargine Solostar (LANTUS)  100 UNIT/ML Solostar Pen Inject 10 Units into the skin daily. 03/21/21   Argentina Donovan, PA-C  Insulin Pen Needle (PEN NEEDLES 31GX5/16") 31G X 8 MM MISC 1 each by Does not apply route daily. 03/21/21   Argentina Donovan, PA-C  lipase/protease/amylase (CREON) 12000-38000 units CPEP capsule Take 2 capsules (24,000 Units total) by mouth 3 (three) times daily before meals. 12/14/20   Kathie Dike, MD  metFORMIN (GLUCOPHAGE-XR) 500 MG 24 hr  tablet Take 1 tablet (500 mg total) by mouth in the morning and at bedtime. 03/21/21   Argentina Donovan, PA-C  metoCLOPramide (REGLAN) 5 MG tablet Take 1 tablet (5 mg total) by mouth every 8 (eight) hours as needed for nausea. 01/03/21 01/03/22  Argentina Donovan, PA-C  Multiple Vitamin (MULTIVITAMIN) tablet Take 1 tablet by mouth daily.    [provider]  nortriptyline (PAMELOR) 10 MG capsule TAKE 1 CAPSULE BY MOUTH DAILY AND TAKE 2 CAPSULES BY MOUTH AT BEDTIME FOR NEUROPATHY 01/07/21   Charlott Rakes, MD  ondansetron (ZOFRAN ODT) 4 MG disintegrating tablet Take 1 tablet (4 mg total) by mouth every 8 (eight) hours as needed for nausea or vomiting. 12/23/20   Jeanell Sparrow, DO  ondansetron (ZOFRAN) 4 MG tablet Take 1 tablet (4 mg total) by mouth every 8 (eight) hours as needed for nausea or vomiting. 11/17/20 11/17/21  Dwyane Dee, MD  Oxycodone HCl 10 MG TABS Take 1 tablet (10 mg total) by mouth 4 (four) times daily as needed (pain). 12/14/20   Kathie Dike, MD  pantoprazole (PROTONIX) 40 MG tablet Take 40 mg by mouth daily.    [provider]  pantoprazole (PROTONIX) 40 MG tablet Take 1 tablet (40 mg total) by mouth daily for 14 days. 12/23/20 01/06/21  Jeanell Sparrow, DO  polyethylene glycol (MIRALAX / GLYCOLAX) 17 g packet Take 17 g by mouth 2 (two) times daily. Patient not taking: Reported on 01/03/2021 12/14/20   Kathie Dike, MD  promethazine (PHENERGAN) 25 MG suppository PLACE 1 SUPPOSITORY (25 MG TOTAL) RECTALLY EVERY 6 HOURS AS NEEDED FOR NAUSEA AND VOMITING 02/08/21   Charlott Rakes, MD  sucralfate (CARAFATE) 1 g tablet Take 1 tablet (1 g total) by mouth 4 (four) times daily -  with meals and at bedtime for 7 days. 12/23/20 12/30/20  Jeanell Sparrow, DO  traZODone (DESYREL) 50 MG tablet Take 1-2 tablets (50-100 mg total) by mouth at bedtime as needed for sleep. Patient not taking: Reported on 01/03/2021 10/12/20 01/10/21  [name], NP      Allergies    Patient  has no known allergies.    Review of Systems   Review of Systems  Gastrointestinal:  Positive for abdominal pain and nausea.  All other systems reviewed and are negative.  Physical Exam Updated Vital Signs BP 116/80    Pulse 64    Temp 98 F (36.7 C) (Oral)    Resp 13    SpO2 96%  Physical Exam Vitals and nursing note reviewed.  Constitutional:      General: He is not in acute distress.    Appearance: He is well-developed.  HENT:     Head: Normocephalic and atraumatic.  Eyes:     Conjunctiva/sclera: Conjunctivae normal.  Cardiovascular:     Rate and Rhythm: Normal rate and regular rhythm.     Heart sounds: No murmur heard. Pulmonary:     Effort: Pulmonary effort is normal. No respiratory distress.     Breath sounds:  Normal breath sounds.  Abdominal:     Palpations: Abdomen is soft.     Tenderness: There is no abdominal tenderness.  Musculoskeletal:        General: No swelling.     Cervical back: Neck supple.  Skin:    General: Skin is warm and dry.     Capillary Refill: Capillary refill takes less than 2 seconds.  Neurological:     Mental Status: He is alert.  Psychiatric:        Mood and Affect: Mood normal.    ED Results / Procedures / Treatments   Labs (all labs ordered are listed, but only abnormal results are displayed) Labs Reviewed  CBC WITH DIFFERENTIAL/PLATELET - Abnormal; Notable for the following components:      Result Value   WBC 11.3 (*)    Abs Immature Granulocytes 0.09 (*)    All other components within normal limits  COMPREHENSIVE METABOLIC PANEL - Abnormal; Notable for the following components:   Sodium 128 (*)    Chloride 96 (*)    CO2 17 (*)    Glucose, Bld 596 (*)    Calcium 8.8 (*)    Total Bilirubin 1.7 (*)    All other components within normal limits  URINALYSIS, ROUTINE W REFLEX MICROSCOPIC - Abnormal; Notable for the following components:   Color, Urine STRAW (*)    Glucose, UA >=500 (*)    Ketones, ur 20 (*)    All other  components within normal limits  BETA-HYDROXYBUTYRIC ACID - Abnormal; Notable for the following components:   Beta-Hydroxybutyric Acid 3.91 (*)    All other components within normal limits  BLOOD GAS, VENOUS - Abnormal; Notable for the following components:   pCO2, Ven 32.4 (*)    Bicarbonate 18.0 (*)    Acid-base deficit 5.8 (*)    All other components within normal limits  CBG MONITORING, ED - Abnormal; Notable for the following components:   Glucose-Capillary >600 (*)    All other components within normal limits  CBG MONITORING, ED - Abnormal; Notable for the following components:   Glucose-Capillary 594 (*)    All other components within normal limits  CBG MONITORING, ED - Abnormal; Notable for the following components:   Glucose-Capillary 394 (*)    All other components within normal limits  CBG MONITORING, ED - Abnormal; Notable for the following components:   Glucose-Capillary 357 (*)    All other components within normal limits  CBG MONITORING, ED - Abnormal; Notable for the following components:   Glucose-Capillary 224 (*)    All other components within normal limits  CBG MONITORING, ED - Abnormal; Notable for the following components:   Glucose-Capillary 124 (*)    All other components within normal limits  CBG MONITORING, ED - Abnormal; Notable for the following components:   Glucose-Capillary 108 (*)    All other components within normal limits  RESP PANEL BY RT-PCR (FLU A&B, COVID) ARPGX2  LIPASE, BLOOD  ETHANOL  BETA-HYDROXYBUTYRIC ACID  BASIC METABOLIC PANEL  BASIC METABOLIC PANEL  BASIC METABOLIC PANEL  BASIC METABOLIC PANEL  BETA-HYDROXYBUTYRIC ACID    EKG None  Radiology CT ABDOMEN PELVIS W CONTRAST  Result Date: 03/21/2021 CLINICAL DATA:  Polyuria, polydipsia, central abdominal pain radiating to back EXAM: CT ABDOMEN AND PELVIS WITH CONTRAST TECHNIQUE: Multidetector CT imaging of the abdomen and pelvis was performed using the standard protocol  following bolus administration of intravenous contrast. RADIATION DOSE REDUCTION: This exam was performed according to the departmental dose-optimization  program which includes automated exposure control, adjustment of the mA and/or kV according to patient size and/or use of iterative reconstruction technique. CONTRAST:  161m OMNIPAQUE IOHEXOL 300 MG/ML  SOLN COMPARISON:  12/23/2020 FINDINGS: Lower chest: No acute pleural or parenchymal lung disease. Chronic scarring at the left base. Hepatobiliary: No focal liver abnormality is seen. No gallstones, gallbladder wall thickening, or biliary dilatation. Pancreas: Stable complex fluid collections, largest involving the pancreatic body and tail measuring up to 9.0 x 3.1 cm, with a second smaller collection along the pancreatic head measuring 2.4 x 1.8 cm, not appreciably changed since prior study. Consistent with sequela of previous necrotizing pancreatitis. No acute inflammatory changes. Spleen: Normal in size without focal abnormality. Adrenals/Urinary Tract: Adrenal glands are unremarkable. Kidneys are normal, without renal calculi, focal lesion, or hydronephrosis. Bladder is unremarkable. Stomach/Bowel: No bowel obstruction or ileus. No bowel wall thickening or inflammatory change. Vascular/Lymphatic: Aortic atherosclerosis. No enlarged abdominal or pelvic lymph nodes. Reproductive: Prostate is unremarkable. Other: No free fluid or free gas. Small fat containing umbilical hernia. No bowel herniation. Musculoskeletal: No acute or destructive bony lesions. Reconstructed images demonstrate no additional findings. IMPRESSION: 1. Chronic sequela of pancreatitis, with complex pseudocysts unchanged since prior exam. No acute inflammatory changes. 2. Small fat containing umbilical hernia, unchanged. 3.  Aortic Atherosclerosis (ICD10-I70.0). Electronically Signed   By: MRanda NgoM.D.   On: 03/21/2021 17:33    Procedures .Critical Care Performed by: DLucrezia Starch MD Authorized by: DLucrezia Starch MD   Critical care provider statement:    Critical care time (minutes):  34   Critical care was necessary to treat or prevent imminent or life-threatening deterioration of the following conditions:  Endocrine crisis and metabolic crisis   Critical care was time spent personally by me on the following activities:  Development of treatment plan with patient or surrogate, discussions with consultants, evaluation of patient's response to treatment, examination of patient, ordering and review of laboratory studies, ordering and review of radiographic studies, ordering and performing treatments and interventions, pulse oximetry, re-evaluation of patient's condition and review of old charts    Medications Ordered in ED Medications  0.9 %  sodium chloride infusion (0 mLs Intravenous Stopped 03/21/21 1833)  dextrose 5 %-0.45 % sodium chloride infusion ( Intravenous New Bag/Given 03/21/21 1833)  enoxaparin (LOVENOX) injection 40 mg (has no administration in time range)  lactated ringers bolus 2,080 mL (has no administration in time range)  dextrose 50 % solution 0-50 mL (has no administration in time range)  potassium chloride 10 mEq in 100 mL IVPB (has no administration in time range)  insulin regular, human (MYXREDLIN) 100 units/ 100 mL infusion (has no administration in time range)  0.9 %  sodium chloride infusion (has no administration in time range)  dextrose 5 %-0.45 % sodium chloride infusion (has no administration in time range)  potassium chloride 10 mEq in 100 mL IVPB (0 mEq Intravenous Stopped 03/21/21 1929)  HYDROmorphone (DILAUDID) injection 1 mg (1 mg Intravenous Given 03/21/21 1701)  iohexol (OMNIPAQUE) 300 MG/ML solution 100 mL (100 mLs Intravenous Contrast Given 03/21/21 1712)  HYDROmorphone (DILAUDID) injection 1 mg (1 mg Intravenous Given 03/21/21 1812)    ED Course/ Medical Decision Making/ A&P                           Medical Decision  Making Amount and/or Complexity of Data Reviewed Labs: ordered. Radiology: ordered.  Risk Prescription drug management. Decision  regarding hospitalization.   61 year old gentleman with medical history of diabetes, pancreatitis presented to ER with concern for acute on chronic abdominal pain as well as hyperglycemia.  On exam he is well-appearing in no distress.  Noted generalized tenderness on exam.  Basic lab work reviewed, noted mild acidosis with low bicarbonate level and noted elevated beta hydroxybutyrate.  Given these findings with the associated hyperglycemia, concern for early DKA.  Suspect related to medication noncompliance.  Given his abdominal pain worsening than normal, check CT scan, no acute changes, and redemonstrating chronic pancreatitis.  Plan dependently reviewed CT imaging and agree with radiology.  For the borderline DKA, initiated DKA order set including insulin infusion and IV fluids.  Will admit to medicine for further management. Discussed with [facility] who will accept. Provided pain medicine for abdominal pain and had symptomatic improvement.  Additional history obtained from review of primary care office, family medicine PA note.  Chronic pancreatitis, poorly controlled type 2 diabetes.        Final Clinical Impression(s) / ED Diagnoses Final diagnoses:  Diabetic ketoacidosis without coma associated with type 2 diabetes mellitus ([place])  Chronic pancreatitis, unspecified pancreatitis type [facility])    Rx / DC Orders ED Discharge Orders     None         Lucrezia Starch, MD 03/21/21 2212

## 2021-03-21 NOTE — ED Triage Notes (Signed)
[facility] , has been off his meds for 3  days and no lantus for 2 months , cbg read high with ems

## 2021-03-22 DIAGNOSIS — E669 Obesity, unspecified: Secondary | ICD-10-CM

## 2021-03-22 DIAGNOSIS — G894 Chronic pain syndrome: Secondary | ICD-10-CM | POA: Diagnosis not present

## 2021-03-22 DIAGNOSIS — E440 Moderate protein-calorie malnutrition: Secondary | ICD-10-CM | POA: Diagnosis not present

## 2021-03-22 DIAGNOSIS — K861 Other chronic pancreatitis: Secondary | ICD-10-CM

## 2021-03-22 DIAGNOSIS — E1143 Type 2 diabetes mellitus with diabetic autonomic (poly)neuropathy: Secondary | ICD-10-CM

## 2021-03-22 DIAGNOSIS — Z86711 Personal history of pulmonary embolism: Secondary | ICD-10-CM | POA: Diagnosis not present

## 2021-03-22 DIAGNOSIS — E1142 Type 2 diabetes mellitus with diabetic polyneuropathy: Secondary | ICD-10-CM

## 2021-03-22 DIAGNOSIS — E1165 Type 2 diabetes mellitus with hyperglycemia: Secondary | ICD-10-CM | POA: Diagnosis not present

## 2021-03-22 DIAGNOSIS — Z9114 Patient's other noncompliance with medication regimen: Secondary | ICD-10-CM

## 2021-03-22 DIAGNOSIS — E66811 Obesity, class 1: Secondary | ICD-10-CM | POA: Diagnosis present

## 2021-03-22 DIAGNOSIS — Z91148 Patient's other noncompliance with medication regimen for other reason: Secondary | ICD-10-CM

## 2021-03-22 DIAGNOSIS — K3184 Gastroparesis: Secondary | ICD-10-CM

## 2021-03-22 DIAGNOSIS — E7800 Pure hypercholesterolemia, unspecified: Secondary | ICD-10-CM

## 2021-03-22 DIAGNOSIS — E1110 Type 2 diabetes mellitus with ketoacidosis without coma: Secondary | ICD-10-CM

## 2021-03-22 LAB — BASIC METABOLIC PANEL
Anion gap: 16 — ABNORMAL HIGH (ref 5–15)
Anion gap: 9 (ref 5–15)
BUN: 10 mg/dL (ref 6–20)
BUN: 6 mg/dL (ref 6–20)
CO2: 15 mmol/L — ABNORMAL LOW (ref 22–32)
CO2: 18 mmol/L — ABNORMAL LOW (ref 22–32)
Calcium: 8.9 mg/dL (ref 8.9–10.3)
Calcium: 8.2 mg/dL — ABNORMAL LOW (ref 8.9–10.3)
Chloride: 102 mmol/L (ref 98–111)
Chloride: 103 mmol/L (ref 98–111)
Creatinine, Ser: 0.93 mg/dL (ref 0.61–1.24)
Creatinine, Ser: 0.8 mg/dL (ref 0.61–1.24)
GFR, Estimated: >60 mL/min (ref >60)
GFR, Estimated: >60 mL/min (ref >60)
Glucose, Bld: 146 mg/dL — ABNORMAL HIGH (ref 70–99)
Glucose, Bld: 340 mg/dL — ABNORMAL HIGH (ref 70–99)
Potassium: 3.9 mmol/L (ref 3.5–5.1)
Potassium: 3.6 mmol/L (ref 3.5–5.1)
Sodium: 133 mmol/L — ABNORMAL LOW (ref 135–145)
Sodium: 130 mmol/L — ABNORMAL LOW (ref 135–145)

## 2021-03-22 LAB — GLUCOSE, CAPILLARY
Glucose-Capillary: 198 mg/dL — ABNORMAL HIGH (ref 70–99)
Glucose-Capillary: 187 mg/dL — ABNORMAL HIGH (ref 70–99)
Glucose-Capillary: 294 mg/dL — ABNORMAL HIGH (ref 70–99)
Glucose-Capillary: 144 mg/dL — ABNORMAL HIGH (ref 70–99)
Glucose-Capillary: 272 mg/dL — ABNORMAL HIGH (ref 70–99)

## 2021-03-22 LAB — BETA-HYDROXYBUTYRIC ACID
Beta-Hydroxybutyric Acid: 3.17 mmol/L — ABNORMAL HIGH (ref 0.05–0.27)
Beta-Hydroxybutyric Acid: 3.23 mmol/L — ABNORMAL HIGH (ref 0.05–0.27)
Beta-Hydroxybutyric Acid: 0.28 mmol/L — ABNORMAL HIGH (ref 0.05–0.27)

## 2021-03-22 MED ORDER — INSULIN GLARGINE-YFGN 100 UNIT/ML ~~LOC~~ SOLN
16.0000 [IU] | Freq: Every day | SUBCUTANEOUS | Status: DC
  Administered 2021-03-23: 16 [IU] via SUBCUTANEOUS
  Filled 2021-03-22 (×2): qty 0.16

## 2021-03-22 MED ORDER — ADULT MULTIVITAMIN W/MINERALS CH
1.0000 | ORAL_TABLET | Freq: Every day | ORAL | Status: DC
  Administered 2021-03-22 – 2021-03-24 (×3): 1 via ORAL
  Filled 2021-03-22 (×3): qty 1

## 2021-03-22 MED ORDER — GLUCERNA SHAKE PO LIQD
237.0000 mL | Freq: Two times a day (BID) | ORAL | Status: DC
  Administered 2021-03-22 – 2021-03-24 (×4): 237 mL via ORAL
  Filled 2021-03-22 (×6): qty 237

## 2021-03-22 MED ORDER — METFORMIN HCL ER 500 MG PO TB24
500.0000 mg | ORAL_TABLET | Freq: Two times a day (BID) | ORAL | Status: DC
  Administered 2021-03-23 – 2021-03-24 (×2): 500 mg via ORAL
  Filled 2021-03-22 (×3): qty 1

## 2021-03-22 MED ORDER — PROSOURCE PLUS PO LIQD
30.0000 mL | Freq: Two times a day (BID) | ORAL | Status: DC
  Administered 2021-03-22 – 2021-03-23 (×4): 30 mL via ORAL
  Filled 2021-03-22 (×4): qty 30

## 2021-03-22 MED ORDER — INSULIN STARTER KIT- PEN NEEDLES (ENGLISH)
1.0000 | Freq: Once | Status: AC
  Administered 2021-03-22: 1
  Filled 2021-03-22: qty 1

## 2021-03-22 MED ORDER — LIVING WELL WITH DIABETES BOOK
Freq: Once | Status: AC
  Filled 2021-03-22: qty 1

## 2021-03-22 MED ORDER — GLIPIZIDE 5 MG PO TABS
5.0000 mg | ORAL_TABLET | Freq: Every day | ORAL | Status: DC
  Administered 2021-03-23: 5 mg via ORAL
  Filled 2021-03-22: qty 1

## 2021-03-22 MED ORDER — COVID-19MRNA BIVAL VACC PFIZER 30 MCG/0.3ML IM SUSP
0.3000 mL | Freq: Once | INTRAMUSCULAR | Status: AC
  Administered 2021-03-23: 0.3 mL via INTRAMUSCULAR
  Filled 2021-03-22: qty 0.3

## 2021-03-22 MED ORDER — INSULIN ASPART 100 UNIT/ML IJ SOLN
0.0000 [IU] | INTRAMUSCULAR | Status: DC
  Administered 2021-03-22: 5 [IU] via SUBCUTANEOUS
  Administered 2021-03-22: 1 [IU] via SUBCUTANEOUS
  Administered 2021-03-23: 2 [IU] via SUBCUTANEOUS
  Administered 2021-03-23 (×2): 3 [IU] via SUBCUTANEOUS
  Administered 2021-03-23 (×2): 1 [IU] via SUBCUTANEOUS
  Administered 2021-03-23: 3 [IU] via SUBCUTANEOUS
  Administered 2021-03-23 – 2021-03-24 (×2): 2 [IU] via SUBCUTANEOUS

## 2021-03-22 NOTE — Plan of Care (Signed)
°  Problem: Education: Goal: Ability to describe self-care measures that may prevent or decrease complications (Diabetes Survival Skills Education) will improve 03/22/2021 0115 by Virgilio Belling, RN Outcome: Progressing 03/22/2021 0115 by Virgilio Belling, RN Outcome: Progressing   Problem: Metabolic: Goal: Ability to maintain appropriate glucose levels will improve 03/22/2021 0115 by Virgilio Belling, RN Outcome: Progressing 03/22/2021 0115 by Virgilio Belling, RN Outcome: Progressing   Problem: Education: Goal: Knowledge of General Education information will improve Description: Including pain rating scale, medication(s)/side effects and non-pharmacologic comfort measures Outcome: Progressing   Problem: Activity: Goal: Risk for activity intolerance will decrease 03/22/2021 0115 by Virgilio Belling, RN Outcome: Progressing 03/22/2021 0115 by Virgilio Belling, RN Outcome: Progressing   Problem: Pain Managment: Goal: General experience of comfort will improve 03/22/2021 0115 by Virgilio Belling, RN Outcome: Progressing 03/22/2021 0115 by Virgilio Belling, RN Outcome: Progressing   Problem: Safety: Goal: Ability to remain free from injury will improve Outcome: Progressing

## 2021-03-22 NOTE — Progress Notes (Signed)
PROGRESS NOTE    Bryce Mendoza  ZOX:[id number] DOB: 20-Apr-1960 DOA: 03/21/2021 PCP: Claiborne RiggFleming, Zelda W, NP     Brief Narrative:   61 y.o. WM PMHx WM Necrotizing Pancreatitis evolving to Chronic pancreatitis. New onset DM Type II, treated with oral medications & Basal insulin   Been prescribed basal insulin but never started taking it. He was [facility] and admitted he had not been taking medications for several days. In the clinic his Glucose was >600. EMS activated and patient transported to [facility].    Subjective: A/O x4, negative SOB, negative CP.  Positive acute on chronic abdominal pain   Assessment & Plan: Covid vaccination; vaccinated 2/3 requested booster.  1/20 ordered Bivalent booster   Principal Problem:   Hyperglycemia due to diabetes mellitus (HCC) Active Problems:   Hyperglycemia   Idiopathic polyneuropathy   Chronic pancreatitis (HCC)   HLD (hyperlipidemia)   History of pulmonary embolism   Noncompliance with medication regimen   Obesity, Class I, BMI 30-34.9   Uncontrolled type 2 diabetes mellitus with hyperglycemia (HCC)   DKA, type 2, not at goal [facility](HCC)   Malnutrition of moderate degree   Diabetic polyneuropathy (HCC)   Diabetic gastroparesis (HCC)   Chronic pain syndrome   Moderate malnutrition (HCC)   DM type II uncontrolled with hyperglycemia/DKA -1/19 hemoglobin A1c= 12.2 - Glipizide 5 mg daily - 1/20 increase Semglee 16 units daily - 1/20 decrease sensitive SSI -Metformin XR 500 mg BID -1/20 consult DM coordinator: Patient states has never had training in the use of insulin, and although was supposed to be on insulin was never actually prescribed insulin.[name].  Please provide patient proper training and guidance in the use of CBG testing, insulin use.  DM polyneuropathy/DM Gastroparesis - Secondary to uncontrolled diabetes.  Multiple episodes of DKA - Reglan 5 mg TID PRN -Nortriptyline 10 mg BID   Chronic pancreatitis - Stable  counseled patient secondary to uncontrolled diabetes.  Patient with poor insight into his disease process. -Creon 24,000 units TID  Chronic pain syndrome - Patient states has pain contract out in town with pain clinic - Oxy IR 10 mg QID PRN (home dose)   HLD - Lipitor 40 mg daily - Lipid panel pending  Hx PE - Continue Eliquis   Moderate malnutrition -1/20 Prosource BID per nutrition recommendation  Obesity class I (BMI 31.7 kg/m) -Patient has spoken with DM nutritionist     DVT prophylaxis: Eliquis Code Status: Full Family Communication:  Status is: Inpatient    Dispo: The patient is from: Home              Anticipated d/c is to: Home              Anticipated d/c date is: 1 day              Patient currently is not medically stable to d/c.      Consultants:    Procedures/Significant Events:    I have personally reviewed and interpreted all radiology studies and my findings are as above.  VENTILATOR SETTINGS:    Cultures   Antimicrobials:    Devices    LINES / TUBES:      Continuous Infusions:     Objective: Vitals:   03/21/21 2230 03/21/21 2326 03/22/21 0336 03/22/21 1336  BP: 131/90 (!) 142/94 110/69 117/78  Pulse: 68 69 (!) 55 62  Resp: 11 15 16 15   Temp:  98 F (36.7 C) 97.7 F (36.5 C) 98.3  F (36.8 C)  TempSrc:  Oral Oral Oral  SpO2: 97% 100% 98% 99%  Weight:   109 kg   Height:   6\' 1"  (1.854 m)     Intake/Output Summary (Last 24 hours) at 03/22/2021 1941 Last data filed at 03/22/2021 1802 Gross per 24 hour  Intake 4065.18 ml  Output 1400 ml  Net 2665.18 ml   Filed Weights   03/22/21 0336  Weight: 109 kg    Examination:  General: A/O x4 No acute respiratory distress Eyes: negative scleral hemorrhage, negative anisocoria, negative icterus ENT: Negative Runny nose, negative gingival bleeding, Neck:  Negative scars, masses, torticollis, lymphadenopathy, JVD Lungs: Clear to auscultation bilaterally without  wheezes or crackles Cardiovascular: Regular rate and rhythm without murmur gallop or rub normal S1 and S2 Abdomen: negative abdominal pain, nondistended, positive soft, bowel sounds, no rebound, no ascites, no appreciable mass Extremities: No significant cyanosis, clubbing, or edema bilateral lower extremities Skin: Negative rashes, lesions, ulcers Psychiatric:  Negative depression, negative anxiety, negative fatigue, negative mania  Central nervous system:  Cranial nerves II through XII intact, tongue/uvula midline, all extremities muscle strength 5/5, sensation intact throughout, negative dysarthria, negative expressive aphasia, negative receptive aphasia.  .     Data Reviewed: Care during the described time interval was provided by me .  I have reviewed this patient's available data, including medical history, events of note, physical examination, and all test results as part of my evaluation.  CBC: Recent Labs  Lab 03/21/21 1534  WBC 11.3*  NEUTROABS 6.3  HGB 16.6  HCT 46.9  MCV 90.4  PLT 258   Basic Metabolic Panel: Recent Labs  Lab 03/21/21 1534 03/21/21 2222 03/22/21 0257 03/22/21 0926  NA 128* 134* 133* 130*  K 4.2 3.9 3.9 3.6  CL 96* 104 102 103  CO2 17* 18* 15* 18*  GLUCOSE 596* 153* 146* 340*  BUN 10 9 10 6   CREATININE 0.93 0.76 0.93 0.80  CALCIUM 8.8* 8.9 8.9 8.2*   GFR: Estimated Creatinine Clearance: 130.3 mL/min (by C-G formula based on SCr of 0.8 mg/dL). Liver Function Tests: Recent Labs  Lab 03/21/21 1534  AST 18  ALT 14  ALKPHOS 88  BILITOT 1.7*  PROT 7.1  ALBUMIN 3.8   Recent Labs  Lab 03/21/21 1534  LIPASE 50   No results for input(s): AMMONIA in the last 168 hours. Coagulation Profile: No results for input(s): INR, PROTIME in the last 168 hours. Cardiac Enzymes: No results for input(s): CKTOTAL, CKMB, CKMBINDEX, TROPONINI in the last 168 hours. BNP (last 3 results) No results for input(s): PROBNP in the last 8760  hours. HbA1C: Recent Labs    03/21/21 1444  HGBA1C 12.2*   CBG: Recent Labs  Lab 03/21/21 2333 03/22/21 0334 03/22/21 0724 03/22/21 1125 03/22/21 1529  GLUCAP 158* 198* 187* 294* 144*   Lipid Profile: No results for input(s): CHOL, HDL, LDLCALC, TRIG, CHOLHDL, LDLDIRECT in the last 72 hours. Thyroid Function Tests: No results for input(s): TSH, T4TOTAL, FREET4, T3FREE, THYROIDAB in the last 72 hours. Anemia Panel: No results for input(s): VITAMINB12, FOLATE, FERRITIN, TIBC, IRON, RETICCTPCT in the last 72 hours. Sepsis Labs: No results for input(s): PROCALCITON, LATICACIDVEN in the last 168 hours.  Recent Results (from the past 240 hour(s))  Resp Panel by RT-PCR (Flu A&B, Covid) Nasopharyngeal Swab     Status: None   Collection Time: 03/21/21  7:34 PM   Specimen: Nasopharyngeal Swab; Nasopharyngeal(NP) swabs in vial transport medium  Result Value Ref Range Status  SARS Coronavirus 2 by RT PCR NEGATIVE NEGATIVE Final    Comment: (NOTE) SARS-CoV-2 target nucleic acids are NOT DETECTED.  The SARS-CoV-2 RNA is generally detectable in upper respiratory specimens during the acute phase of infection. The lowest concentration of SARS-CoV-2 viral copies this assay can detect is 138 copies/mL. A negative result does not preclude SARS-Cov-2 infection and should not be used as the sole basis for treatment or other patient management decisions. A negative result may occur with  improper specimen collection/handling, submission of specimen other than nasopharyngeal swab, presence of viral mutation(s) within the areas targeted by this assay, and inadequate number of viral copies(<138 copies/mL). A negative result must be combined with clinical observations, patient history, and epidemiological information. The expected result is Negative.  Fact Sheet for Patients:  BloggerCourse.com  Fact Sheet for Healthcare Providers:   SeriousBroker.it  This test is no t yet approved or cleared by the [place] FDA and  has been authorized for detection and/or diagnosis of SARS-CoV-2 by FDA under an Emergency Use Authorization (EUA). This EUA will remain  in effect (meaning this test can be used) for the duration of the COVID-19 declaration under Section 564(b)(1) of the Act, 21 U.S.C.section 360bbb-3(b)(1), unless the authorization is terminated  or revoked sooner.       Influenza A by PCR NEGATIVE NEGATIVE Final   Influenza B by PCR NEGATIVE NEGATIVE Final    Comment: (NOTE) The Xpert Xpress SARS-CoV-2/FLU/RSV plus assay is intended as an aid in the diagnosis of influenza from Nasopharyngeal swab specimens and should not be used as a sole basis for treatment. Nasal washings and aspirates are unacceptable for Xpert Xpress SARS-CoV-2/FLU/RSV testing.  Fact Sheet for Patients: BloggerCourse.com  Fact Sheet for Healthcare Providers: SeriousBroker.it  This test is not yet approved or cleared by the [place] FDA and has been authorized for detection and/or diagnosis of SARS-CoV-2 by FDA under an Emergency Use Authorization (EUA). This EUA will remain in effect (meaning this test can be used) for the duration of the COVID-19 declaration under Section 564(b)(1) of the Act, 21 U.S.C. section 360bbb-3(b)(1), unless the authorization is terminated or revoked.  Performed at [facility], [address], [place], [place] [address]          Radiology Studies: CT ABDOMEN PELVIS W CONTRAST  Result Date: 03/21/2021 CLINICAL DATA:  Polyuria, polydipsia, central abdominal pain radiating to back EXAM: CT ABDOMEN AND PELVIS WITH CONTRAST TECHNIQUE: Multidetector CT imaging of the abdomen and pelvis was performed using the standard protocol following bolus administration of intravenous contrast. RADIATION DOSE  REDUCTION: This exam was performed according to the departmental dose-optimization program which includes automated exposure control, adjustment of the mA and/or kV according to patient size and/or use of iterative reconstruction technique. CONTRAST:  OMNIPAQUE IOHEXOL 300 MG/ML  SOLN COMPARISON:  12/23/2020 FINDINGS: Lower chest: No acute pleural or parenchymal lung disease. Chronic scarring at the left base. Hepatobiliary: No focal liver abnormality is seen. No gallstones, gallbladder wall thickening, or biliary dilatation. Pancreas: Stable complex fluid collections, largest involving the pancreatic body and tail measuring up to 9.0 x 3.1 cm, with a second smaller collection along the pancreatic head measuring 2.4 x 1.8 cm, not appreciably changed since prior study. Consistent with sequela of previous necrotizing pancreatitis. No acute inflammatory changes. Spleen: Normal in size without focal abnormality. Adrenals/Urinary Tract: Adrenal glands are unremarkable. Kidneys are normal, without renal calculi, focal lesion, or hydronephrosis. Bladder is unremarkable. Stomach/Bowel: No bowel obstruction or  ileus. No bowel wall thickening or inflammatory change. Vascular/Lymphatic: Aortic atherosclerosis. No enlarged abdominal or pelvic lymph nodes. Reproductive: Prostate is unremarkable. Other: No free fluid or free gas. Small fat containing umbilical hernia. No bowel herniation. Musculoskeletal: No acute or destructive bony lesions. Reconstructed images demonstrate no additional findings. IMPRESSION: 1. Chronic sequela of pancreatitis, with complex pseudocysts unchanged since prior exam. No acute inflammatory changes. 2. Small fat containing umbilical hernia, unchanged. 3.  Aortic Atherosclerosis (ICD10-I70.0). Electronically Signed   By: Sharlet Salina M.D.   On: 03/21/2021 17:33        Scheduled Meds:  (feeding supplement) PROSource Plus  30 mL Oral BID BM   apixaban  5 mg Oral BID   atorvastatin  40 mg  Oral Daily   [START ON 03/23/2021] COVID-19 mRNA bivalent vaccine (Pfizer)  0.3 mL Intramuscular ONCE-1600   dicyclomine  10 mg Oral TID AC   feeding supplement (GLUCERNA SHAKE)  237 mL Oral BID BM   [START ON 03/23/2021] glipiZIDE  5 mg Oral QAC breakfast   insulin aspart  0-9 Units Subcutaneous Q4H   [START ON 03/23/2021] insulin glargine-yfgn  16 Units Subcutaneous Daily   lipase/protease/amylase  24,000 Units Oral TID AC   [START ON 03/23/2021] metFORMIN  500 mg Oral BID WC   multivitamin with minerals  1 tablet Oral Daily   nortriptyline  10 mg Oral BID   pantoprazole  40 mg Oral Daily   sucralfate  1 g Oral TID WC & HS   Continuous Infusions:     LOS: 0 days    Time spent:40 min    Bryce Mendoza, Roselind Messier, MD [facility]   If 7PM-7AM, please contact night-coverage 03/22/2021, 7:41 PM

## 2021-03-22 NOTE — Plan of Care (Signed)
  Problem: Education: Goal: Ability to describe self-care measures that may prevent or decrease complications (Diabetes Survival Skills Education) will improve Outcome: Progressing   Problem: Health Behavior/Discharge Planning: Goal: Ability to manage health-related needs will improve Outcome: Progressing   Problem: Activity: Goal: Risk for activity intolerance will decrease Outcome: Progressing   

## 2021-03-22 NOTE — Progress Notes (Signed)
Transition of Care [facility]) Screening Note  Patient Details  Name: Bryce Mendoza Date of Birth: [birth year]  Transition of Care [facility]) CM/SW Contact:    Sherie Don, LCSW Phone Number: 03/22/2021, 9:35 AM  Transition of Care Department [facility]) has reviewed patient and no TOC needs have been identified at this time. We will continue to monitor patient advancement through interdisciplinary progression rounds. If new patient transition needs arise, please place a TOC consult.

## 2021-03-22 NOTE — Progress Notes (Signed)
Initial Nutrition Assessment  DOCUMENTATION CODES:   Non-severe (moderate) malnutrition in context of chronic illness, Obesity unspecified  INTERVENTION:  - will order Glucerna Shake BID, each supplement provides 220 kcal and 10 grams of protein. - will order 30 ml Prosource Plus BID, each supplement provides 100 kcal and 15 grams protein.  - will order 1 tablet multivitamin with minerals/day.    NUTRITION DIAGNOSIS:   Moderate Malnutrition related to chronic illness as evidenced by mild fat depletion, mild muscle depletion, percent weight loss.  GOAL:   Patient will meet greater than or equal to 90% of their needs  MONITOR:   PO intake, Supplement acceptance, Labs, Weight trends  REASON FOR ASSESSMENT:   Malnutrition Screening Tool, Consult Diet education  ASSESSMENT:   61 y.o. male with medical history of necrotizing pancreatitis evolving to chronic pancreatitis, emphysema, and DVT. New onset diabetes which is being treated with oral medications. He had been prescribed basal insulin but never started using it. He was seen at [facility] and admitted he had not been taking medications for several days. In the clinic his CBG was >600 mg/dl. EMS activated and patient transported to [facility].  Patient laying in bed with no visitors present at the time of RD visit.  Patient with a good understanding of carbohydrate foods and monitoring portions of carb-containing foods. He intermittently utilizes Nutrition Facts Labels.   Provided patient with "Label Reading Tips for Diabetes" and "Carbohydrate Counting for People with Diabetes" handouts from the Academy of Nutrition and Dietetics and reviewed these handouts with him.  Patient was seen by another [place] RD in 12/2020. Since that time patient has had intermittent episodes of N/V with occasional diarrhea. Episodes happen at random and do not seem triggered by anything in particular. The last episode began after patient had consumed  an Ensure. He reports that episodes usually last several days (~3 days on average). During these times, it is very difficult for him to eat.   This morning he had eggs, grits, and cheerios for breakfast and was able to consume 100% of the meal without much difficulty.   He reports that he recently had several GI biopsies taken (PTA, unsure of exactly when) and is awaiting results.   Weight today is 240 lb and weight on 12/23/20 was 164 lb. This indicates 24 lb weight loss (9% body weight) in the past 3 months; significant for time frame.    Labs reviewed; CBGs: 198, 187, 294 mg/dl, Na: 401 mmol/l, Ca: 8.2 mg/dl.  Medications reviewed; 5 mg glucotrol/day, sliding scale novolog, 16 units semglee/day, 24000 units creon TID, 500 mg glucophage BID, 40 mg oral protonix/day, 10 mEq IV KCl x4 runs 1/19, 1 g carafate TID.   IVF; D5-1/2 NS @ 50 ml/hr (204 kcal/24 hrs).     NUTRITION - FOCUSED PHYSICAL EXAM:  Flowsheet Row Most Recent Value  Orbital Region No depletion  Upper Arm Region Mild depletion  Thoracic and Lumbar Region Unable to assess  Buccal Region Mild depletion  Temple Region Mild depletion  Clavicle Bone Region Mild depletion  Clavicle and Acromion Bone Region Mild depletion  Scapular Bone Region Unable to assess  Dorsal Hand No depletion  Patellar Region No depletion  Anterior Thigh Region Mild depletion  Posterior Calf Region No depletion  Edema (RD Assessment) Mild  [BLE]  Hair Reviewed  Eyes Reviewed  Mouth Reviewed  Skin Reviewed  Nails Reviewed       Diet Order:   Diet Order  Diet heart healthy/carb modified Room service appropriate? Yes; Fluid consistency: Thin  Diet effective now                   EDUCATION NEEDS:   Education needs have been addressed  Skin:  Skin Assessment: Reviewed RN Assessment  Last BM:  PTA/unknown  Height:   Ht Readings from Last 1 Encounters:  03/22/21 6\' 1"  (1.854 m)    Weight:   Wt Readings from  Last 1 Encounters:  03/22/21 109 kg     Estimated Nutritional Needs:  Kcal:  2180-2400 kcal Protein:  110-125 grams Fluid:  >/= 2.3 L/day     03/24/21, MS, RD, LDN Inpatient Clinical Dietitian RD pager # available in [facility]  After hours/weekend pager # available in [facility]

## 2021-03-22 NOTE — Progress Notes (Signed)
Inpatient Diabetes Program Recommendations  AACE/ADA: New Consensus Statement on Inpatient Glycemic Control (2015)  Target Ranges:  Prepandial:   less than 140 mg/dL      Peak postprandial:   less than 180 mg/dL (1-2 hours)      Critically ill patients:  140 - 180 mg/dL   Lab Results  Component Value Date   GLUCAP 144 (H) 03/22/2021   HGBA1C 12.2 (A) 03/21/2021    Review of Glycemic Control  Diabetes history: DM2 Outpatient Diabetes medications: metformin 500 BID, glipizide 5 mg QAM Current orders for Inpatient glycemic control: Novolog 0-9 units Q4H, Semglee 16 units QD  HgbA1C - 12.2%  Inpatient Diabetes Program Recommendations:    Add Novolog 4 units TID with meals if eating > 50%. Change s/s to Novolog 0-9 units TID with meals and 0-5 HS  Spoke with pt at bedside regarding his diabetes control at home. Pt states he never got prescription filled for Lantus. Discussed HgbA1C of 12.2% (average blood sugar of 290 mg/dL). Pt states he doesn't eat out. Cooks most meals at home. Discussed importance of weight loss and exercise in controlling blood sugars. Discussed hypoglycemia s/s and treatment.  Educated patient on insulin pen use at home. Reviewed contents of insulin flexpen starter kit. Reviewed all steps if insulin pen including attachment of needle, 2-unit air shot, dialing up dose, giving injection, removing needle, disposal of sharps, storage of unused insulin, disposal of insulin etc. Patient able to provide successful return demonstration. Also reviewed troubleshooting with insulin pen. MD to give patient Rxs for insulin pens and insulin pen needles.   Pt to f/u with [place] within the next 2 weeks after discharge.   Thank you. Lorenda Peck, RD, LDN, CDE Inpatient Diabetes Coordinator [phone]

## 2021-03-23 DIAGNOSIS — G894 Chronic pain syndrome: Secondary | ICD-10-CM | POA: Diagnosis not present

## 2021-03-23 DIAGNOSIS — E669 Obesity, unspecified: Secondary | ICD-10-CM | POA: Diagnosis not present

## 2021-03-23 DIAGNOSIS — E440 Moderate protein-calorie malnutrition: Secondary | ICD-10-CM | POA: Diagnosis not present

## 2021-03-23 DIAGNOSIS — E7800 Pure hypercholesterolemia, unspecified: Secondary | ICD-10-CM | POA: Diagnosis not present

## 2021-03-23 DIAGNOSIS — E0842 Diabetes mellitus due to underlying condition with diabetic polyneuropathy: Secondary | ICD-10-CM

## 2021-03-23 DIAGNOSIS — E1143 Type 2 diabetes mellitus with diabetic autonomic (poly)neuropathy: Secondary | ICD-10-CM | POA: Diagnosis not present

## 2021-03-23 DIAGNOSIS — Z86711 Personal history of pulmonary embolism: Secondary | ICD-10-CM | POA: Diagnosis not present

## 2021-03-23 DIAGNOSIS — E1110 Type 2 diabetes mellitus with ketoacidosis without coma: Secondary | ICD-10-CM | POA: Diagnosis not present

## 2021-03-23 DIAGNOSIS — Z9114 Patient's other noncompliance with medication regimen: Secondary | ICD-10-CM | POA: Diagnosis not present

## 2021-03-23 DIAGNOSIS — K861 Other chronic pancreatitis: Secondary | ICD-10-CM | POA: Diagnosis not present

## 2021-03-23 DIAGNOSIS — E1165 Type 2 diabetes mellitus with hyperglycemia: Secondary | ICD-10-CM | POA: Diagnosis not present

## 2021-03-23 DIAGNOSIS — K3184 Gastroparesis: Secondary | ICD-10-CM | POA: Diagnosis not present

## 2021-03-23 LAB — COMPREHENSIVE METABOLIC PANEL
ALT: 13 U/L (ref 0–44)
AST: 15 U/L (ref 15–41)
Albumin: 2.9 g/dL — ABNORMAL LOW (ref 3.5–5.0)
Alkaline Phosphatase: 57 U/L (ref 38–126)
Anion gap: 9 (ref 5–15)
BUN: 8 mg/dL (ref 6–20)
CO2: 22 mmol/L (ref 22–32)
Calcium: 9.1 mg/dL (ref 8.9–10.3)
Chloride: 105 mmol/L (ref 98–111)
Creatinine, Ser: 0.69 mg/dL (ref 0.61–1.24)
GFR, Estimated: >60 mL/min (ref >60)
Glucose, Bld: 171 mg/dL — ABNORMAL HIGH (ref 70–99)
Potassium: 3.3 mmol/L — ABNORMAL LOW (ref 3.5–5.1)
Sodium: 136 mmol/L (ref 135–145)
Total Bilirubin: 0.6 mg/dL (ref 0.3–1.2)
Total Protein: 5.4 g/dL — ABNORMAL LOW (ref 6.5–8.1)

## 2021-03-23 LAB — LIPID PANEL
Cholesterol: 156 mg/dL (ref 0–200)
HDL: 32 mg/dL — ABNORMAL LOW (ref >40)
LDL Cholesterol: 87 mg/dL (ref 0–99)
Total CHOL/HDL Ratio: 4.9 RATIO
Triglycerides: 187 mg/dL — ABNORMAL HIGH (ref <150)
VLDL: 37 mg/dL (ref 0–40)

## 2021-03-23 LAB — GLUCOSE, CAPILLARY
Glucose-Capillary: 141 mg/dL — ABNORMAL HIGH (ref 70–99)
Glucose-Capillary: 162 mg/dL — ABNORMAL HIGH (ref 70–99)
Glucose-Capillary: 167 mg/dL — ABNORMAL HIGH (ref 70–99)
Glucose-Capillary: 212 mg/dL — ABNORMAL HIGH (ref 70–99)
Glucose-Capillary: 220 mg/dL — ABNORMAL HIGH (ref 70–99)
Glucose-Capillary: 228 mg/dL — ABNORMAL HIGH (ref 70–99)
Glucose-Capillary: 228 mg/dL — ABNORMAL HIGH (ref 70–99)

## 2021-03-23 LAB — CBC WITH DIFFERENTIAL/PLATELET
Abs Immature Granulocytes: 0.04 10*3/uL (ref 0.00–0.07)
Basophils Absolute: 0.1 10*3/uL (ref 0.0–0.1)
Basophils Relative: 1 %
Eosinophils Absolute: 0.1 10*3/uL (ref 0.0–0.5)
Eosinophils Relative: 2 %
HCT: 41.5 % (ref 39.0–52.0)
Hemoglobin: 14.2 g/dL (ref 13.0–17.0)
Immature Granulocytes: 1 %
Lymphocytes Relative: 50 %
Lymphs Abs: 4 10*3/uL (ref 0.7–4.0)
MCH: 31.3 pg (ref 26.0–34.0)
MCHC: 34.2 g/dL (ref 30.0–36.0)
MCV: 91.4 fL (ref 80.0–100.0)
Monocytes Absolute: 0.5 10*3/uL (ref 0.1–1.0)
Monocytes Relative: 7 %
Neutro Abs: 3.1 10*3/uL (ref 1.7–7.7)
Neutrophils Relative %: 39 %
Platelets: 168 10*3/uL (ref 150–400)
RBC: 4.54 MIL/uL (ref 4.22–5.81)
RDW: 14.6 % (ref 11.5–15.5)
WBC: 8 10*3/uL (ref 4.0–10.5)
nRBC: 0 % (ref 0.0–0.2)

## 2021-03-23 LAB — MAGNESIUM: Magnesium: 1.8 mg/dL (ref 1.7–2.4)

## 2021-03-23 LAB — PHOSPHORUS: Phosphorus: 3.5 mg/dL (ref 2.5–4.6)

## 2021-03-23 MED ORDER — TRAZODONE HCL 50 MG PO TABS
50.0000 mg | ORAL_TABLET | Freq: Every day | ORAL | Status: DC
  Administered 2021-03-23: 50 mg via ORAL
  Filled 2021-03-23: qty 1

## 2021-03-23 MED ORDER — ATORVASTATIN CALCIUM 40 MG PO TABS
60.0000 mg | ORAL_TABLET | Freq: Every day | ORAL | Status: DC
  Administered 2021-03-24: 60 mg via ORAL
  Filled 2021-03-23: qty 1

## 2021-03-23 MED ORDER — INSULIN GLARGINE-YFGN 100 UNIT/ML ~~LOC~~ SOLN
5.0000 [IU] | Freq: Every day | SUBCUTANEOUS | Status: DC
  Administered 2021-03-23: 5 [IU] via SUBCUTANEOUS
  Filled 2021-03-23: qty 0.05

## 2021-03-23 MED ORDER — GLIPIZIDE 10 MG PO TABS
10.0000 mg | ORAL_TABLET | Freq: Every day | ORAL | Status: DC
  Administered 2021-03-24: 10 mg via ORAL
  Filled 2021-03-23: qty 1

## 2021-03-23 NOTE — Plan of Care (Signed)
°  Problem: Metabolic: Goal: Ability to maintain appropriate glucose levels will improve Outcome: Progressing   Problem: Activity: Goal: Risk for activity intolerance will decrease Outcome: Progressing   Problem: Pain Managment: Goal: General experience of comfort will improve Outcome: Progressing   Problem: Safety: Goal: Ability to remain free from injury will improve Outcome: Progressing

## 2021-03-23 NOTE — Plan of Care (Signed)
  Problem: Education: Goal: Ability to describe self-care measures that may prevent or decrease complications (Diabetes Survival Skills Education) will improve Outcome: Progressing   Problem: Education: Goal: Knowledge of General Education information will improve Description: Including pain rating scale, medication(s)/side effects and non-pharmacologic comfort measures Outcome: Progressing   Problem: Activity: Goal: Risk for activity intolerance will decrease Outcome: Progressing   

## 2021-03-23 NOTE — Progress Notes (Signed)
PROGRESS NOTE    Bryce Mendoza  EOF:[id number] DOB: [birth year] DOA: 03/21/2021 PCP: Claiborne Rigg, NP     Brief Narrative:   61 y.o. WM PMHx WM Necrotizing Pancreatitis evolving to Chronic pancreatitis. New onset DM Type II, treated with oral medications & Basal insulin   Been prescribed basal insulin but never started taking it. He was seen today at [facility] and admitted he had not been taking medications for several days. In the clinic his Glucose was >600. EMS activated and patient transported to [facility].    Subjective: 1/21-afebrile overnight however patient's CBGs continue to remain high.  A/O x4, negative abdominal pain.   Assessment & Plan: Covid vaccination; vaccinated 2/3 requested booster.  1/20 ordered Bivalent booster   Principal Problem:   Hyperglycemia due to diabetes mellitus (HCC) Active Problems:   Hyperglycemia   Idiopathic polyneuropathy   Chronic pancreatitis (HCC)   HLD (hyperlipidemia)   History of pulmonary embolism   Noncompliance with medication regimen   Obesity, Class I, BMI 30-34.9   Uncontrolled type 2 diabetes mellitus with hyperglycemia (HCC)   DKA, type 2, not at goal [facility])   Malnutrition of moderate degree   Diabetic polyneuropathy (HCC)   Diabetic gastroparesis (HCC)   Chronic pain syndrome   Moderate malnutrition (HCC)   DM type II uncontrolled with hyperglycemia/DKA -1/19 hemoglobin A1c= 12.2 -1/21 increase Glipizide 10mg  daily -1/21 increase Semglee 16 units daily, 5 units qhs - 1/20 decrease sensitive SSI -Metformin XR 500 mg BID -1/20 consult DM coordinator: Patient states has never had training in the use of insulin, and although was supposed to be on insulin was never actually prescribed insulin.2/20  Please provide patient proper training and guidance in the use of CBG testing, insulin use.  DM polyneuropathy/DM Gastroparesis - Secondary to uncontrolled diabetes.  Multiple episodes of DKA - Reglan 5 mg TID PRN -Nortriptyline  10 mg BID   Chronic pancreatitis - Stable counseled patient secondary to uncontrolled diabetes.  Patient with poor insight into his disease process. -Creon 24,000 units TID  Chronic pain syndrome - Patient states has pain contract out in town with pain clinic - Oxy IR 10 mg QID PRN (home dose)   HLD - 1/21 increase Lipitor 60 mg daily - 1/21 LDL= 87.  Goal LDL<70  Hx PE - Continue Eliquis   Moderate malnutrition -1/20 Prosource BID per nutrition recommendation  Obesity class I (BMI 31.7 kg/m) -Patient has spoken with DM nutritionist     DVT prophylaxis: Eliquis Code Status: Full Family Communication:  Status is: Inpatient    Dispo: The patient is from: Home              Anticipated d/c is to: Home              Anticipated d/c date is: 1 day              Patient currently is not medically stable to d/c.      Consultants:    Procedures/Significant Events:    I have personally reviewed and interpreted all radiology studies and my findings are as above.  VENTILATOR SETTINGS:    Cultures   Antimicrobials:    Devices    LINES / TUBES:      Continuous Infusions:     Objective: Vitals:   03/22/21 2021 03/23/21 0456 03/23/21 0800 03/23/21 1315  BP: 122/81 119/79 111/75 112/87  Pulse: (!) 55 (!) 52 (!) 55 67  Resp: 19 19 18 18   Temp: 01-03-2001)  97.1 F (36.2 C) 97.6 F (36.4 C) 98.1 F (36.7 C) 98.2 F (36.8 C)  TempSrc: Axillary Oral Oral Oral  SpO2: 97% 95% 98% 98%  Weight:      Height:        Intake/Output Summary (Last 24 hours) at 03/23/2021 1338 Last data filed at 03/23/2021 0800 Gross per 24 hour  Intake 1135.67 ml  Output 300 ml  Net 835.67 ml    Filed Weights   03/22/21 0336  Weight: 109 kg    Examination:  General: A/O x4 No acute respiratory distress Eyes: negative scleral hemorrhage, negative anisocoria, negative icterus ENT: Negative Runny nose, negative gingival bleeding, Neck:  Negative scars, masses,  torticollis, lymphadenopathy, JVD Lungs: Clear to auscultation bilaterally without wheezes or crackles Cardiovascular: Regular rate and rhythm without murmur gallop or rub normal S1 and S2 Abdomen: negative abdominal pain, nondistended, positive soft, bowel sounds, no rebound, no ascites, no appreciable mass Extremities: No significant cyanosis, clubbing, or edema bilateral lower extremities Skin: Negative rashes, lesions, ulcers Psychiatric:  Negative depression, negative anxiety, negative fatigue, negative mania  Central nervous system:  Cranial nerves II through XII intact, tongue/uvula midline, all extremities muscle strength 5/5, sensation intact throughout, negative dysarthria, negative expressive aphasia, negative receptive aphasia.  .     Data Reviewed: Care during the described time interval was provided by me .  I have reviewed this patient's available data, including medical history, events of note, physical examination, and all test results as part of my evaluation.  CBC: Recent Labs  Lab 03/21/21 1534 03/23/21 0314  WBC 11.3* 8.0  NEUTROABS 6.3 3.1  HGB 16.6 14.2  HCT 46.9 41.5  MCV 90.4 91.4  PLT 258 168    Basic Metabolic Panel: Recent Labs  Lab 03/21/21 1534 03/21/21 2222 03/22/21 0257 03/22/21 0926 03/23/21 0314  NA 128* 134* 133* 130* 136  K 4.2 3.9 3.9 3.6 3.3*  CL 96* 104 102 103 105  CO2 17* 18* 15* 18* 22  GLUCOSE 596* 153* 146* 340* 171*  BUN 10 9 10 6 8   CREATININE 0.93 0.76 0.93 0.80 0.69  CALCIUM 8.8* 8.9 8.9 8.2* 9.1  MG  --   --   --   --  1.8  PHOS  --   --   --   --  3.5    GFR: Estimated Creatinine Clearance: 130.3 mL/min (by C-G formula based on SCr of 0.69 mg/dL). Liver Function Tests: Recent Labs  Lab 03/21/21 1534 03/23/21 0314  AST 18 15  ALT 14 13  ALKPHOS 88 57  BILITOT 1.7* 0.6  PROT 7.1 5.4*  ALBUMIN 3.8 2.9*    Recent Labs  Lab 03/21/21 1534  LIPASE 50    No results for input(s): AMMONIA in the last 168  hours. Coagulation Profile: No results for input(s): INR, PROTIME in the last 168 hours. Cardiac Enzymes: No results for input(s): CKTOTAL, CKMB, CKMBINDEX, TROPONINI in the last 168 hours. BNP (last 3 results) No results for input(s): PROBNP in the last 8760 hours. HbA1C: Recent Labs    03/21/21 1444  HGBA1C 12.2*    CBG: Recent Labs  Lab 03/22/21 2019 03/23/21 0020 03/23/21 0453 03/23/21 0724 03/23/21 1311  GLUCAP 272* 228* 162* 141* 212*    Lipid Profile: Recent Labs    03/23/21 0314  CHOL 156  HDL 32*  LDLCALC 87  TRIG [id number]*  CHOLHDL 4.9   Thyroid Function Tests: No results for input(s): TSH, T4TOTAL, FREET4, T3FREE, THYROIDAB in the last  72 hours. Anemia Panel: No results for input(s): VITAMINB12, FOLATE, FERRITIN, TIBC, IRON, RETICCTPCT in the last 72 hours. Sepsis Labs: No results for input(s): PROCALCITON, LATICACIDVEN in the last 168 hours.  Recent Results (from the past 240 hour(s))  Resp Panel by RT-PCR (Flu A&B, Covid) Nasopharyngeal Swab     Status: None   Collection Time: 03/21/21  7:34 PM   Specimen: Nasopharyngeal Swab; Nasopharyngeal(NP) swabs in vial transport medium  Result Value Ref Range Status   SARS Coronavirus 2 by RT PCR NEGATIVE NEGATIVE Final    Comment: (NOTE) SARS-CoV-2 target nucleic acids are NOT DETECTED.  The SARS-CoV-2 RNA is generally detectable in upper respiratory specimens during the acute phase of infection. The lowest concentration of SARS-CoV-2 viral copies this assay can detect is 138 copies/mL. A negative result does not preclude SARS-Cov-2 infection and should not be used as the sole basis for treatment or other patient management decisions. A negative result may occur with  improper specimen collection/handling, submission of specimen other than nasopharyngeal swab, presence of viral mutation(s) within the areas targeted by this assay, and inadequate number of viral copies(<138 copies/mL). A negative result must  be combined with clinical observations, patient history, and epidemiological information. The expected result is Negative.  Fact Sheet for Patients:  BloggerCourse.comhttps://www.fda.gov/media/152166/download  Fact Sheet for Healthcare Providers:  SeriousBroker.ithttps://www.fda.gov/media/152162/download  This test is no t yet approved or cleared by the [place] FDA and  has been authorized for detection and/or diagnosis of SARS-CoV-2 by FDA under an Emergency Use Authorization (EUA). This EUA will remain  in effect (meaning this test can be used) for the duration of the COVID-19 declaration under Section 564(b)(1) of the Act, 21 U.S.C.section 360bbb-3(b)(1), unless the authorization is terminated  or revoked sooner.       Influenza A by PCR NEGATIVE NEGATIVE Final   Influenza B by PCR NEGATIVE NEGATIVE Final    Comment: (NOTE) The Xpert Xpress SARS-CoV-2/FLU/RSV plus assay is intended as an aid in the diagnosis of influenza from Nasopharyngeal swab specimens and should not be used as a sole basis for treatment. Nasal washings and aspirates are unacceptable for Xpert Xpress SARS-CoV-2/FLU/RSV testing.  Fact Sheet for Patients: BloggerCourse.comhttps://www.fda.gov/media/152166/download  Fact Sheet for Healthcare Providers: SeriousBroker.ithttps://www.fda.gov/media/152162/download  This test is not yet approved or cleared by the [place] FDA and has been authorized for detection and/or diagnosis of SARS-CoV-2 by FDA under an Emergency Use Authorization (EUA). This EUA will remain in effect (meaning this test can be used) for the duration of the COVID-19 declaration under Section 564(b)(1) of the Act, 21 U.S.C. section 360bbb-3(b)(1), unless the authorization is terminated or revoked.  Performed at [facility], [address] [place], [place] [address]           Radiology Studies: CT ABDOMEN PELVIS W CONTRAST  Result Date: 03/21/2021 CLINICAL DATA:  Polyuria, polydipsia, central abdominal pain  radiating to back EXAM: CT ABDOMEN AND PELVIS WITH CONTRAST TECHNIQUE: Multidetector CT imaging of the abdomen and pelvis was performed using the standard protocol following bolus administration of intravenous contrast. RADIATION DOSE REDUCTION: This exam was performed according to the departmental dose-optimization program which includes automated exposure control, adjustment of the mA and/or kV according to patient size and/or use of iterative reconstruction technique. CONTRAST:  100mL OMNIPAQUE IOHEXOL 300 MG/ML  SOLN COMPARISON:  12/23/2020 FINDINGS: Lower chest: No acute pleural or parenchymal lung disease. Chronic scarring at the left base. Hepatobiliary: No focal liver abnormality is seen. No gallstones, gallbladder wall thickening,  or biliary dilatation. Pancreas: Stable complex fluid collections, largest involving the pancreatic body and tail measuring up to 9.0 x 3.1 cm, with a second smaller collection along the pancreatic head measuring 2.4 x 1.8 cm, not appreciably changed since prior study. Consistent with sequela of previous necrotizing pancreatitis. No acute inflammatory changes. Spleen: Normal in size without focal abnormality. Adrenals/Urinary Tract: Adrenal glands are unremarkable. Kidneys are normal, without renal calculi, focal lesion, or hydronephrosis. Bladder is unremarkable. Stomach/Bowel: No bowel obstruction or ileus. No bowel wall thickening or inflammatory change. Vascular/Lymphatic: Aortic atherosclerosis. No enlarged abdominal or pelvic lymph nodes. Reproductive: Prostate is unremarkable. Other: No free fluid or free gas. Small fat containing umbilical hernia. No bowel herniation. Musculoskeletal: No acute or destructive bony lesions. Reconstructed images demonstrate no additional findings. IMPRESSION: 1. Chronic sequela of pancreatitis, with complex pseudocysts unchanged since prior exam. No acute inflammatory changes. 2. Small fat containing umbilical hernia, unchanged. 3.  Aortic  Atherosclerosis (ICD10-I70.0). Electronically Signed   By: Sharlet Salina M.D.   On: 03/21/2021 17:33        Scheduled Meds:  (feeding supplement) PROSource Plus  30 mL Oral BID BM   apixaban  5 mg Oral BID   atorvastatin  40 mg Oral Daily   COVID-19 mRNA bivalent vaccine (Pfizer)  0.3 mL Intramuscular ONCE-1600   dicyclomine  10 mg Oral TID AC   feeding supplement (GLUCERNA SHAKE)  237 mL Oral BID BM   glipiZIDE  5 mg Oral QAC breakfast   insulin aspart  0-9 Units Subcutaneous Q4H   insulin glargine-yfgn  16 Units Subcutaneous Daily   lipase/protease/amylase  24,000 Units Oral TID AC   metFORMIN  500 mg Oral BID WC   multivitamin with minerals  1 tablet Oral Daily   nortriptyline  10 mg Oral BID   pantoprazole  40 mg Oral Daily   sucralfate  1 g Oral TID WC & HS   Continuous Infusions:     LOS: 0 days    Time spent:40 min    Maliya Marich, Roselind Messier, MD [facility]   If 7PM-7AM, please contact night-coverage 03/23/2021, 1:38 PM

## 2021-03-24 DIAGNOSIS — G894 Chronic pain syndrome: Secondary | ICD-10-CM | POA: Diagnosis not present

## 2021-03-24 DIAGNOSIS — R739 Hyperglycemia, unspecified: Secondary | ICD-10-CM | POA: Diagnosis not present

## 2021-03-24 DIAGNOSIS — K861 Other chronic pancreatitis: Secondary | ICD-10-CM | POA: Diagnosis not present

## 2021-03-24 DIAGNOSIS — E669 Obesity, unspecified: Secondary | ICD-10-CM | POA: Diagnosis not present

## 2021-03-24 DIAGNOSIS — Z9114 Patient's other noncompliance with medication regimen: Secondary | ICD-10-CM | POA: Diagnosis not present

## 2021-03-24 DIAGNOSIS — E1142 Type 2 diabetes mellitus with diabetic polyneuropathy: Secondary | ICD-10-CM | POA: Diagnosis not present

## 2021-03-24 DIAGNOSIS — E1110 Type 2 diabetes mellitus with ketoacidosis without coma: Secondary | ICD-10-CM | POA: Diagnosis not present

## 2021-03-24 DIAGNOSIS — Z86711 Personal history of pulmonary embolism: Secondary | ICD-10-CM | POA: Diagnosis not present

## 2021-03-24 DIAGNOSIS — E1143 Type 2 diabetes mellitus with diabetic autonomic (poly)neuropathy: Secondary | ICD-10-CM | POA: Diagnosis not present

## 2021-03-24 DIAGNOSIS — E1165 Type 2 diabetes mellitus with hyperglycemia: Secondary | ICD-10-CM | POA: Diagnosis not present

## 2021-03-24 DIAGNOSIS — E440 Moderate protein-calorie malnutrition: Secondary | ICD-10-CM | POA: Diagnosis not present

## 2021-03-24 DIAGNOSIS — E7800 Pure hypercholesterolemia, unspecified: Secondary | ICD-10-CM | POA: Diagnosis not present

## 2021-03-24 LAB — PHOSPHORUS: Phosphorus: 4 mg/dL (ref 2.5–4.6)

## 2021-03-24 LAB — COMPREHENSIVE METABOLIC PANEL
ALT: 12 U/L (ref 0–44)
AST: 16 U/L (ref 15–41)
Albumin: 2.8 g/dL — ABNORMAL LOW (ref 3.5–5.0)
Alkaline Phosphatase: 53 U/L (ref 38–126)
Anion gap: 9 (ref 5–15)
BUN: 10 mg/dL (ref 6–20)
CO2: 22 mmol/L (ref 22–32)
Calcium: 9 mg/dL (ref 8.9–10.3)
Chloride: 106 mmol/L (ref 98–111)
Creatinine, Ser: 0.68 mg/dL (ref 0.61–1.24)
GFR, Estimated: >60 mL/min (ref >60)
Glucose, Bld: 110 mg/dL — ABNORMAL HIGH (ref 70–99)
Potassium: 3.1 mmol/L — ABNORMAL LOW (ref 3.5–5.1)
Sodium: 137 mmol/L (ref 135–145)
Total Bilirubin: 0.7 mg/dL (ref 0.3–1.2)
Total Protein: 5.2 g/dL — ABNORMAL LOW (ref 6.5–8.1)

## 2021-03-24 LAB — CBC WITH DIFFERENTIAL/PLATELET
Abs Immature Granulocytes: 0.05 10*3/uL (ref 0.00–0.07)
Basophils Absolute: 0.1 10*3/uL (ref 0.0–0.1)
Basophils Relative: 1 %
Eosinophils Absolute: 0.1 10*3/uL (ref 0.0–0.5)
Eosinophils Relative: 1 %
HCT: 41.8 % (ref 39.0–52.0)
Hemoglobin: 14.3 g/dL (ref 13.0–17.0)
Immature Granulocytes: 1 %
Lymphocytes Relative: 41 %
Lymphs Abs: 3.9 10*3/uL (ref 0.7–4.0)
MCH: 31.8 pg (ref 26.0–34.0)
MCHC: 34.2 g/dL (ref 30.0–36.0)
MCV: 93.1 fL (ref 80.0–100.0)
Monocytes Absolute: 0.6 10*3/uL (ref 0.1–1.0)
Monocytes Relative: 7 %
Neutro Abs: 4.7 10*3/uL (ref 1.7–7.7)
Neutrophils Relative %: 49 %
Platelets: 149 10*3/uL — ABNORMAL LOW (ref 150–400)
RBC: 4.49 MIL/uL (ref 4.22–5.81)
RDW: 14.3 % (ref 11.5–15.5)
WBC: 9.4 10*3/uL (ref 4.0–10.5)
nRBC: 0 % (ref 0.0–0.2)

## 2021-03-24 LAB — GLUCOSE, CAPILLARY
Glucose-Capillary: 109 mg/dL — ABNORMAL HIGH (ref 70–99)
Glucose-Capillary: 183 mg/dL — ABNORMAL HIGH (ref 70–99)
Glucose-Capillary: 254 mg/dL — ABNORMAL HIGH (ref 70–99)

## 2021-03-24 LAB — MAGNESIUM: Magnesium: 1.8 mg/dL (ref 1.7–2.4)

## 2021-03-24 MED ORDER — INSULIN GLARGINE-YFGN 100 UNIT/ML ~~LOC~~ SOLN
6.0000 [IU] | Freq: Every day | SUBCUTANEOUS | Status: DC
  Filled 2021-03-24: qty 0.06

## 2021-03-24 MED ORDER — GLIPIZIDE 10 MG PO TABS: 10.0000 mg | ORAL_TABLET | Freq: Every day | ORAL | 0 refills | Status: DC

## 2021-03-24 MED ORDER — ATORVASTATIN CALCIUM 20 MG PO TABS: 60.0000 mg | ORAL_TABLET | Freq: Every day | ORAL | 0 refills | Status: DC

## 2021-03-24 MED ORDER — INSULIN GLARGINE-YFGN 100 UNIT/ML ~~LOC~~ SOLN
18.0000 [IU] | Freq: Every day | SUBCUTANEOUS | Status: DC
  Administered 2021-03-24: 18 [IU] via SUBCUTANEOUS
  Filled 2021-03-24: qty 0.18

## 2021-03-24 MED ORDER — INSULIN GLARGINE 100 UNIT/ML SOLOSTAR PEN
18.0000 [IU] | PEN_INJECTOR | Freq: Every day | SUBCUTANEOUS | 11 refills | Status: DC
Start: 2021-03-24 — End: 2021-09-22

## 2021-03-24 MED ORDER — INSULIN GLARGINE 100 UNIT/ML SOLOSTAR PEN: 8.0000 [IU] | PEN_INJECTOR | Freq: Every day | SUBCUTANEOUS | 0 refills | Status: DC

## 2021-03-24 NOTE — Plan of Care (Signed)
  Problem: Education: Goal: Ability to describe self-care measures that may prevent or decrease complications (Diabetes Survival Skills Education) will improve Outcome: Progressing   Problem: Education: Goal: Knowledge of General Education information will improve Description: Including pain rating scale, medication(s)/side effects and non-pharmacologic comfort measures Outcome: Progressing   Problem: Activity: Goal: Risk for activity intolerance will decrease Outcome: Progressing   

## 2021-03-24 NOTE — Discharge Summary (Signed)
Physician Discharge Summary  Bryce Mendoza TGG:[id number] DOB: 1960-04-05 DOA: 03/21/2021  PCP: [name], NP  Admit date: 03/21/2021 Discharge date: 03/24/2021  Time spent: 35 minutes  Recommendations for Outpatient Follow-up:   Covid vaccination; vaccinated 2/3 requested booster.  1/20 ordered Bivalent booster  DM type II uncontrolled with hyperglycemia/DKA -1/19 hemoglobin A1c= 12.2 -Glipizide 60m daily -18 units daily, 6 units qhs -Metformin XR 500 mg BID -1/20 consult DM coordinator: Patient states has never had training in the use of insulin, and although was supposed to be on insulin was never actually prescribed insulin..[name] Please provide patient proper training and guidance in the use of CBG testing, insulin use. -Follow-up with PCP in 1 to 2 weeks DM type II uncontrolled with hyperglycemia, DM polyneuropathy, DM gastroparesis   DM polyneuropathy/DM Gastroparesis - Secondary to uncontrolled diabetes.  Multiple episodes of DKA - Reglan 5 mg TID PRN -Nortriptyline 10 mg BID  Chronic pancreatitis - Stable counseled patient secondary to uncontrolled diabetes.  Patient with poor insight into his disease process. -Creon 24,000 units TID   Chronic pain syndrome - Patient states has pain contract out in town with pain clinic - Oxy IR 10 mg QID PRN (home dose)    HLD - 1/21 increase Lipitor 60 mg daily - 1/21 LDL= 87.  Goal LDL<70   Hx PE - Continue Eliquis   Moderate malnutrition -1/20 Prosource BID per nutrition recommendation   Obesity class I (BMI 31.7 kg/m) -Patient has spoken with DM nutritionist    Discharge Diagnoses:  Principal Problem:   Hyperglycemia due to diabetes mellitus ([facility] Active Problems:   Hyperglycemia   Idiopathic polyneuropathy   Chronic pancreatitis (HCC)   HLD (hyperlipidemia)   History of pulmonary embolism   Noncompliance with medication regimen   Obesity, Class I, BMI 30-34.9   Uncontrolled type 2 diabetes mellitus with  hyperglycemia (HCC)   DKA, type 2, not at goal ([facility]   Malnutrition of moderate degree   Diabetic polyneuropathy ([facility]   Diabetic gastroparesis (HCC)   Chronic pain syndrome   Moderate malnutrition ([facility]   Discharge Condition: Stable  Diet recommendation: Carb modified  Filed Weights   03/22/21 0336  Weight: 109 kg    History of present illness:  61y.o. WM PMHx WM Necrotizing Pancreatitis evolving to Chronic pancreatitis. New onset DM Type II, treated with oral medications & Basal insulin    Been prescribed basal insulin but never started taking it. [facility]. In the clinic his Glucose was >600. EMS activated and patient transported to [facility].     Hospital Course:  See above     Discharge Exam: Vitals:   03/23/21 0800 03/23/21 1315 03/23/21 2033 03/24/21 0405  BP: 111/75 112/87 113/78 106/81  Pulse: (!) 55 67 67 60  Resp: _0 Temp: 98.1 F (36.7 C) 98.2 F (36.8 C) 98.5 F (36.9 C) 97.7 F (36.5 C)  TempSrc: Oral Oral  Oral  SpO2: 98% 98% 97% 99%  Weight:      Height:        General: A/O x4 No acute respiratory distress Eyes: negative scleral hemorrhage, negative anisocoria, negative icterus ENT: Negative Runny nose, negative gingival bleeding, Neck:  Negative scars, masses, torticollis, lymphadenopathy, JVD Lungs: Clear to auscultation bilaterally without wheezes or crackles Cardiovascular: Regular rate and rhythm without murmur gallop or rub normal S1 and S2  Discharge Instructions  Allergies as of 03/24/2021   No Known Allergies      Medication List     STOP taking these medications    amLODipine 5 MG tablet Commonly known as: NORVASC   losartan 25 MG tablet Commonly known as: COZAAR   pantoprazole 40 MG tablet Commonly known as: PROTONIX       TAKE these medications    Accu-Chek Guide Me w/Device Kit 1 each by Does not apply route 2  (two) times daily.   Accu-Chek Guide test strip Generic drug: glucose blood Check blood sugar once daily.   Accu-Chek Softclix Lancets lancets Use as instructed   atorvastatin 20 MG tablet Commonly known as: LIPITOR Take 3 tablets (60 mg total) by mouth daily. Start taking on: March 25, 2021 What changed:  medication strength how much to take   blood glucose meter kit and supplies Kit Dispense based on patient and insurance preference. Use up to four times daily as directed.   dicyclomine 10 MG capsule Commonly known as: BENTYL Take 1 capsule (10 mg total) by mouth 3 (three) times daily before meals.   diphenhydrAMINE 25 mg capsule Commonly known as: BENADRYL Take 25 mg by mouth at bedtime as needed for sleep.   Eliquis 5 MG Tabs tablet Generic drug: apixaban Take 1 tablet (5 mg total) by mouth 2 (two) times daily.   FreeStyle Libre 14 Day Reader Kerrin Mo Use as instructed. Check blood glucose level by fingerstick 3times per day. E11.65 G61.81 G60.9   FreeStyle Libre 14 Day Sensor Misc Use as instructed. Check blood glucose level by fingerstick 3times per day. E11.65 G61.81 G60.9   glipiZIDE 10 MG tablet Commonly known as: GLUCOTROL Take 1 tablet (10 mg total) by mouth daily before breakfast. Start taking on: March 25, 2021 What changed:  medication strength how much to take   insulin glargine 100 UNIT/ML Solostar Pen Commonly known as: LANTUS Inject 18 Units into the skin daily. What changed: how much to take   insulin glargine 100 UNIT/ML Solostar Pen Commonly known as: LANTUS Inject 8 Units into the skin at bedtime. What changed: You were already taking a medication with the same name, and this prescription was added. Make sure you understand how and when to take each.   lipase/protease/amylase 12000-38000 units Cpep capsule Commonly known as: CREON Take 2 capsules (24,000 Units total) by mouth 3 (three) times daily before meals.   metFORMIN 500 MG 24 hr  tablet Commonly known as: GLUCOPHAGE-XR Take 1 tablet (500 mg total) by mouth in the morning and at bedtime.   metoCLOPramide 5 MG tablet Commonly known as: Reglan Take 1 tablet (5 mg total) by mouth every 8 (eight) hours as needed for nausea.   multivitamin tablet Take 1 tablet by mouth daily.   nortriptyline 10 MG capsule Commonly known as: PAMELOR TAKE 1 CAPSULE BY MOUTH DAILY AND TAKE 2 CAPSULES BY MOUTH AT BEDTIME FOR NEUROPATHY   omeprazole 40 MG capsule Commonly known as: PRILOSEC Take 40 mg by mouth daily.   ondansetron 4 MG disintegrating tablet Commonly known as: Zofran ODT Take 1 tablet (4 mg total) by mouth every 8 (eight) hours as needed for nausea or vomiting.   Oxycodone HCl 10 MG Tabs Take 1 tablet (10 mg total) by mouth 4 (four) times daily as needed (pain).   PEN NEEDLES 31GX5/16" 31G X 8 MM Misc 1 each by Does not apply route daily.   promethazine 25 MG suppository Commonly known as: PHENERGAN PLACE 1 SUPPOSITORY (  25 MG TOTAL) RECTALLY EVERY 6 HOURS AS NEEDED FOR NAUSEA AND VOMITING What changed: See the new instructions.   sucralfate 1 g tablet Commonly known as: Carafate Take 1 tablet (1 g total) by mouth 4 (four) times daily -  with meals and at bedtime for 7 days.   traZODone 50 MG tablet Commonly known as: DESYREL Take 1-2 tablets (50-100 mg total) by mouth at bedtime as needed for sleep.   Vitamin D (Ergocalciferol) 1.25 MG (50000 UNIT) Caps capsule Commonly known as: DRISDOL Take 50,000 Units by mouth once a week. Monday       No Known Allergies    The results of significant diagnostics from this hospitalization (including imaging, microbiology, ancillary and laboratory) are listed below for reference.    Significant Diagnostic Studies: CT ABDOMEN PELVIS W CONTRAST  Result Date: 03/21/2021 CLINICAL DATA:  Polyuria, polydipsia, central abdominal pain radiating to back EXAM: CT ABDOMEN AND PELVIS WITH CONTRAST TECHNIQUE: Multidetector  CT imaging of the abdomen and pelvis was performed using the standard protocol following bolus administration of intravenous contrast. RADIATION DOSE REDUCTION: This exam was performed according to the departmental dose-optimization program which includes automated exposure control, adjustment of the mA and/or kV according to patient size and/or use of iterative reconstruction technique. CONTRAST:  173m OMNIPAQUE IOHEXOL 300 MG/ML  SOLN COMPARISON:  12/23/2020 FINDINGS: Lower chest: No acute pleural or parenchymal lung disease. Chronic scarring at the left base. Hepatobiliary: No focal liver abnormality is seen. No gallstones, gallbladder wall thickening, or biliary dilatation. Pancreas: Stable complex fluid collections, largest involving the pancreatic body and tail measuring up to 9.0 x 3.1 cm, with a second smaller collection along the pancreatic head measuring 2.4 x 1.8 cm, not appreciably changed since prior study. Consistent with sequela of previous necrotizing pancreatitis. No acute inflammatory changes. Spleen: Normal in size without focal abnormality. Adrenals/Urinary Tract: Adrenal glands are unremarkable. Kidneys are normal, without renal calculi, focal lesion, or hydronephrosis. Bladder is unremarkable. Stomach/Bowel: No bowel obstruction or ileus. No bowel wall thickening or inflammatory change. Vascular/Lymphatic: Aortic atherosclerosis. No enlarged abdominal or pelvic lymph nodes. Reproductive: Prostate is unremarkable. Other: No free fluid or free gas. Small fat containing umbilical hernia. No bowel herniation. Musculoskeletal: No acute or destructive bony lesions. Reconstructed images demonstrate no additional findings. IMPRESSION: 1. Chronic sequela of pancreatitis, with complex pseudocysts unchanged since prior exam. No acute inflammatory changes. 2. Small fat containing umbilical hernia, unchanged. 3.  Aortic Atherosclerosis (ICD10-I70.0). Electronically Signed   By: MRanda NgoM.D.   On:  03/21/2021 17:33    Microbiology: Recent Results (from the past 240 hour(s))  Resp Panel by RT-PCR (Flu A&B, Covid) Nasopharyngeal Swab     Status: None   Collection Time: 03/21/21  7:34 PM   Specimen: Nasopharyngeal Swab; Nasopharyngeal(NP) swabs in vial transport medium  Result Value Ref Range Status   SARS Coronavirus 2 by RT PCR NEGATIVE NEGATIVE Final    Comment: (NOTE) SARS-CoV-2 target nucleic acids are NOT DETECTED.  The SARS-CoV-2 RNA is generally detectable in upper respiratory specimens during the acute phase of infection. The lowest concentration of SARS-CoV-2 viral copies this assay can detect is 138 copies/mL. A negative result does not preclude SARS-Cov-2 infection and should not be used as the sole basis for treatment or other patient management decisions. A negative result may occur with  improper specimen collection/handling, submission of specimen other than nasopharyngeal swab, presence of viral mutation(s) within the areas targeted by this assay, and inadequate number of viral  copies(<138 copies/mL). A negative result must be combined with clinical observations, patient history, and epidemiological information. The expected result is Negative.  Fact Sheet for Patients:  EntrepreneurPulse.com.au  Fact Sheet for Healthcare Providers:  IncredibleEmployment.be  This test is no t yet approved or cleared by the [place] FDA and  has been authorized for detection and/or diagnosis of SARS-CoV-2 by FDA under an Emergency Use Authorization (EUA). This EUA will remain  in effect (meaning this test can be used) for the duration of the COVID-19 declaration under Section 564(b)(1) of the Act, 21 U.S.C.section 360bbb-3(b)(1), unless the authorization is terminated  or revoked sooner.       Influenza A by PCR NEGATIVE NEGATIVE Final   Influenza B by PCR NEGATIVE NEGATIVE Final    Comment: (NOTE) The Xpert Xpress  SARS-CoV-2/FLU/RSV plus assay is intended as an aid in the diagnosis of influenza from Nasopharyngeal swab specimens and should not be used as a sole basis for treatment. Nasal washings and aspirates are unacceptable for Xpert Xpress SARS-CoV-2/FLU/RSV testing.  Fact Sheet for Patients: EntrepreneurPulse.com.au  Fact Sheet for Healthcare Providers: IncredibleEmployment.be  This test is not yet approved or cleared by the [place] FDA and has been authorized for detection and/or diagnosis of SARS-CoV-2 by FDA under an Emergency Use Authorization (EUA). This EUA will remain in effect (meaning this test can be used) for the duration of the COVID-19 declaration under Section 564(b)(1) of the Act, 21 U.S.C. section 360bbb-3(b)(1), unless the authorization is terminated or revoked.  Performed at [facility], [place] [address] [place], [place] [address]      Labs: Basic Metabolic Panel: Recent Labs  Lab 03/21/21 2222 03/22/21 0257 03/22/21 0926 03/23/21 0314 03/24/21 0304  NA 134* 133* 130* 136 137  K 3.9 3.9 3.6 3.3* 3.1*  CL 104 102 103 105 106  CO2 18* 15* 18* 22 22  GLUCOSE 153* 146* 340* 171* 110*  BUN _0 CREATININE 0.76 0.93 0.80 0.69 0.68  CALCIUM 8.9 8.9 8.2* 9.1 9.0  MG  --   --   --  1.8 1.8  PHOS  --   --   --  3.5 4.0   Liver Function Tests: Recent Labs  Lab 03/21/21 1534 03/23/21 0314 03/24/21 0304  AST _1 ALT _2 ALKPHOS 88 57 53  BILITOT 1.7* 0.6 0.7  PROT 7.1 5.4* 5.2*  ALBUMIN 3.8 2.9* 2.8*   Recent Labs  Lab 03/21/21 1534  LIPASE 50   No results for input(s): AMMONIA in the last 168 hours. CBC: Recent Labs  Lab 03/21/21 1534 03/23/21 0314 03/24/21 0304  WBC 11.3* 8.0 9.4  NEUTROABS 6.3 3.1 4.7  HGB 16.6 14.2 14.3  HCT 46.9 41.5 41.8  MCV 90.4 91.4 93.1  PLT 258 168 149*   Cardiac Enzymes: No results for input(s): CKTOTAL, CKMB, CKMBINDEX, TROPONINI  in the last 168 hours. BNP: BNP (last 3 results) No results for input(s): BNP in the last 8760 hours.  ProBNP (last 3 results) No results for input(s): PROBNP in the last 8760 hours.  CBG: Recent Labs  Lab 03/23/21 1620 03/23/21 2034 03/23/21 2330 03/24/21 0324 03/24/21 0735  GLUCAP 220* 228* 167* 109* 183*       Signed:  Dia Crawford, MD [facility]

## 2021-03-24 NOTE — Plan of Care (Signed)
Patient discharged.

## 2021-03-25 ENCOUNTER — Telehealth: Payer: Self-pay

## 2021-03-25 DIAGNOSIS — K200 Eosinophilic esophagitis: Secondary | ICD-10-CM | POA: Diagnosis not present

## 2021-03-25 LAB — HEMOGLOBIN A1C
Hgb A1c MFr Bld: 11.7 % — ABNORMAL HIGH (ref 4.8–5.6)
Mean Plasma Glucose: 289 mg/dL

## 2021-03-25 NOTE — Telephone Encounter (Signed)
Transition Care Management Unsuccessful Follow-up Telephone Call  Date of discharge and from where: [facility] on 03/24/2021  Attempts:  1st Attempt  Reason for unsuccessful TCM follow-up call:  Left voice message unable to reach pt at [phone] .  Pt need to schedule f/u hospital appt.

## 2021-03-26 ENCOUNTER — Telehealth: Payer: Self-pay

## 2021-03-26 NOTE — Telephone Encounter (Signed)
Transition Care Management Follow-up Telephone Call   Date of discharge and from where:[facility] 03/24/2021 How have you been since you were released from the hospital? Felling better  Any questions or concerns? No questions/concerns reported. Sated has been keeping a record with BS reading last reading today 4 hr after meds was 256. Denies any symptoms Items Reviewed: Did the pt receive and understand the discharge instructions provided? have the instructions and have no questions.  Medications obtained and verified? He said that he have the medication list  and the hospital staff reviewed them in detail prior to discharge. He said that he has all of the medications and have no questions.  Any new allergies since your discharge? None reported  Do you have support at home? NO Other (ie: DME, Home Health, etc)        Functional Questionnaire: (I = Independent and D = Dependent) ADL's:  Independent.        Follow up appointments reviewed:   PCP Hospital f/u appt confirmed? NP Zelda on 02.03.2023@ 1050.  Specialist Hospital f/u appt confirmed? scheduled at this time  Are transportation arrangements needed? have transportation   If their condition worsens, is the pt aware to call  their PCP or go to the ED? Yes.Made pt aware if condition worsen or start experiencing rapid weight gain, chest pain, diff breathing, SOB, high fevers, or bleading to refer imediately to ED for further evaluation.  Was the patient provided with contact information for the PCP's office or ED? He has the phone number  Was the pt encouraged to call back with questions or concerns?yes

## 2021-04-01 DIAGNOSIS — Z8719 Personal history of other diseases of the digestive system: Secondary | ICD-10-CM | POA: Diagnosis not present

## 2021-04-05 ENCOUNTER — Other Ambulatory Visit: Payer: Self-pay

## 2021-04-05 ENCOUNTER — Ambulatory Visit: Payer: Medicaid Other | Attending: Nurse Practitioner | Admitting: Nurse Practitioner

## 2021-04-05 ENCOUNTER — Encounter: Payer: Self-pay | Admitting: Nurse Practitioner

## 2021-04-05 VITALS — BP 129/85 | HR 77 | Ht 73.0 in | Wt 240.2 lb

## 2021-04-05 DIAGNOSIS — Z794 Long term (current) use of insulin: Secondary | ICD-10-CM | POA: Diagnosis not present

## 2021-04-05 DIAGNOSIS — Z09 Encounter for follow-up examination after completed treatment for conditions other than malignant neoplasm: Secondary | ICD-10-CM

## 2021-04-05 DIAGNOSIS — E1165 Type 2 diabetes mellitus with hyperglycemia: Secondary | ICD-10-CM | POA: Diagnosis not present
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
## 2021-04-26 ENCOUNTER — Ambulatory Visit: Payer: Medicaid Other | Admitting: Pharmacist

## 2021-04-30 ENCOUNTER — Ambulatory Visit: Payer: Medicaid Other | Admitting: Neurology

## 2021-04-30 ENCOUNTER — Encounter: Payer: Self-pay | Admitting: Neurology

## 2021-04-30 ENCOUNTER — Other Ambulatory Visit: Payer: Self-pay

## 2021-04-30 VITALS — BP 148/93 | HR 89 | Ht 73.0 in | Wt 235.0 lb

## 2021-04-30 DIAGNOSIS — G6289 Other specified polyneuropathies: Secondary | ICD-10-CM

## 2021-04-30 DIAGNOSIS — G6181 Chronic inflammatory demyelinating polyneuritis: Secondary | ICD-10-CM | POA: Diagnosis not present

## 2021-04-30 DIAGNOSIS — R269 Unspecified abnormalities of gait and mobility: Secondary | ICD-10-CM

## 2021-04-30 DIAGNOSIS — G629 Polyneuropathy, unspecified: Secondary | ICD-10-CM | POA: Insufficient documentation
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
## 2021-06-18 ENCOUNTER — Other Ambulatory Visit: Payer: Self-pay | Admitting: Physician Assistant

## 2021-06-18 DIAGNOSIS — E1165 Type 2 diabetes mellitus with hyperglycemia: Secondary | ICD-10-CM

## 2021-06-18 NOTE — Telephone Encounter (Signed)
rx dose was increased on 03/25/21.  ?Requested Prescriptions  ?Pending Prescriptions Disp Refills  ?? glipiZIDE (GLUCOTROL) 5 MG tablet [Pharmacy Med Name: GLIPIZIDE 5 MG TABLET] 90 tablet 1  ?  Sig: TAKE 1 TABLET BY MOUTH DAILY BEFORE BREAKFAST.  ?  ? Endocrinology:  Diabetes - Sulfonylureas Failed - 06/18/2021  2:41 AM  ?  ?  Failed - HBA1C is between 0 and 7.9 and within 180 days  ?  HbA1c, POC (controlled diabetic range)  ?Date Value Ref Range Status  ?03/21/2021 12.2 (A) 0.0 - 7.0 % Final  ? ?Hgb A1c MFr Bld  ?Date Value Ref Range Status  ?03/22/2021 11.7 (H) 4.8 - 5.6 % Final  ?  Comment:  ?  (NOTE) ?        Prediabetes: 5.7 - 6.4 ?        Diabetes: >6.4 ?        Glycemic control for adults with diabetes: <7.0 ?  ?   ?  ?  Passed - Cr in normal range and within 360 days  ?  Creatinine, Ser  ?Date Value Ref Range Status  ?03/24/2021 0.68 0.61 - 1.24 mg/dL Final  ?   ?  ?  Passed - Valid encounter within last 6 months  ?  Recent Outpatient Visits   ?      ? 2 months ago Hospital discharge follow-up  ? [place], [place] W, NP  ? 2 months ago Type 2 diabetes mellitus with hyperglycemia, unspecified whether long term insulin use ([place])  ? [place], [place]  ? 5 months ago Elevated glucose  ? [place], [place]  ? 8 months ago Hospital discharge follow-up  ? [place], [place] W, NP  ? 11 months ago Elevated glucose  ? [place] [name], NP  ?  ?  ?Future Appointments   ?        ? In 1 week [name], NP [place]  ? In 1 month Raulkar, Clide Deutscher, MD [facility], [facility]  ?  ? ?  ?  ?  ? ? ?

## 2021-06-28 ENCOUNTER — Ambulatory Visit: Payer: Medicaid Other | Admitting: Nurse Practitioner

## 2021-07-25 ENCOUNTER — Encounter: Payer: Medicaid Other | Admitting: Physical Medicine and Rehabilitation

## 2021-09-18 ENCOUNTER — Inpatient Hospital Stay (HOSPITAL_COMMUNITY)
Admission: EM | Admit: 2021-09-18 | Discharge: 2021-09-22 | DRG: 637 | Disposition: A | Discharge status: Home / Self Care | Payer: Medicaid Other | Attending: Internal Medicine | Admitting: Internal Medicine

## 2021-09-18 ENCOUNTER — Other Ambulatory Visit: Payer: Self-pay

## 2021-09-18 DIAGNOSIS — F17210 Nicotine dependence, cigarettes, uncomplicated: Secondary | ICD-10-CM | POA: Diagnosis present

## 2021-09-18 DIAGNOSIS — I10 Essential (primary) hypertension: Secondary | ICD-10-CM | POA: Diagnosis not present

## 2021-09-18 DIAGNOSIS — T383X6A Underdosing of insulin and oral hypoglycemic [antidiabetic] drugs, initial encounter: Secondary | ICD-10-CM | POA: Diagnosis present

## 2021-09-18 DIAGNOSIS — K861 Other chronic pancreatitis: Secondary | ICD-10-CM | POA: Diagnosis present

## 2021-09-18 DIAGNOSIS — E1143 Type 2 diabetes mellitus with diabetic autonomic (poly)neuropathy: Secondary | ICD-10-CM | POA: Diagnosis present

## 2021-09-18 DIAGNOSIS — Z794 Long term (current) use of insulin: Secondary | ICD-10-CM

## 2021-09-18 DIAGNOSIS — G894 Chronic pain syndrome: Secondary | ICD-10-CM | POA: Diagnosis present

## 2021-09-18 DIAGNOSIS — F1021 Alcohol dependence, in remission: Secondary | ICD-10-CM

## 2021-09-18 DIAGNOSIS — K859 Acute pancreatitis without necrosis or infection, unspecified: Secondary | ICD-10-CM | POA: Diagnosis not present

## 2021-09-18 DIAGNOSIS — Z7984 Long term (current) use of oral hypoglycemic drugs: Secondary | ICD-10-CM

## 2021-09-18 DIAGNOSIS — R739 Hyperglycemia, unspecified: Secondary | ICD-10-CM | POA: Diagnosis not present

## 2021-09-18 DIAGNOSIS — Z79899 Other long term (current) drug therapy: Secondary | ICD-10-CM

## 2021-09-18 DIAGNOSIS — E1110 Type 2 diabetes mellitus with ketoacidosis without coma: Principal | ICD-10-CM | POA: Diagnosis present

## 2021-09-18 DIAGNOSIS — Z86711 Personal history of pulmonary embolism: Secondary | ICD-10-CM | POA: Diagnosis present

## 2021-09-18 DIAGNOSIS — K8591 Acute pancreatitis with uninfected necrosis, unspecified: Secondary | ICD-10-CM | POA: Diagnosis present

## 2021-09-18 DIAGNOSIS — Z7901 Long term (current) use of anticoagulants: Secondary | ICD-10-CM

## 2021-09-18 DIAGNOSIS — G6181 Chronic inflammatory demyelinating polyneuritis: Secondary | ICD-10-CM | POA: Diagnosis present

## 2021-09-18 DIAGNOSIS — Z86718 Personal history of other venous thrombosis and embolism: Secondary | ICD-10-CM

## 2021-09-18 DIAGNOSIS — Z885 Allergy status to narcotic agent status: Secondary | ICD-10-CM

## 2021-09-18 DIAGNOSIS — K3184 Gastroparesis: Secondary | ICD-10-CM | POA: Diagnosis present

## 2021-09-18 DIAGNOSIS — Z91148 Patient's other noncompliance with medication regimen for other reason: Secondary | ICD-10-CM

## 2021-09-18 LAB — CBC WITH DIFFERENTIAL/PLATELET
Abs Immature Granulocytes: 0.03 10*3/uL (ref 0.00–0.07)
Basophils Absolute: 0.1 10*3/uL (ref 0.0–0.1)
Basophils Relative: 1 %
Eosinophils Absolute: 0.1 10*3/uL (ref 0.0–0.5)
Eosinophils Relative: 1 %
HCT: 44.9 % (ref 39.0–52.0)
Hemoglobin: 16 g/dL (ref 13.0–17.0)
Immature Granulocytes: 0 %
Lymphocytes Relative: 31 %
Lymphs Abs: 3 10*3/uL (ref 0.7–4.0)
MCH: 36.6 pg — ABNORMAL HIGH (ref 26.0–34.0)
MCHC: 35.6 g/dL (ref 30.0–36.0)
MCV: 102.7 fL — ABNORMAL HIGH (ref 80.0–100.0)
Monocytes Absolute: 0.6 10*3/uL (ref 0.1–1.0)
Monocytes Relative: 6 %
Neutro Abs: 5.9 10*3/uL (ref 1.7–7.7)
Neutrophils Relative %: 61 %
Platelets: 159 10*3/uL (ref 150–400)
RBC: 4.37 MIL/uL (ref 4.22–5.81)
RDW: 13.9 % (ref 11.5–15.5)
WBC: 9.7 10*3/uL (ref 4.0–10.5)
nRBC: 0 % (ref 0.0–0.2)

## 2021-09-18 LAB — URINALYSIS, ROUTINE W REFLEX MICROSCOPIC
Bacteria, UA: NONE SEEN
Bilirubin Urine: NEGATIVE
Glucose, UA: >=500 mg/dL — AB
Hgb urine dipstick: NEGATIVE
Ketones, ur: 80 mg/dL — AB
Leukocytes,Ua: NEGATIVE
Nitrite: NEGATIVE
Protein, ur: NEGATIVE mg/dL
Specific Gravity, Urine: 1.021 (ref 1.005–1.030)
pH: 5 (ref 5.0–8.0)

## 2021-09-18 NOTE — ED Triage Notes (Signed)
Patient BIB EMS from home c/o abdominal pain. Per report abdominal pain started 5 days ago. Patient denies N/V. Patient denies fever. Pt hx of pancreatitis.   BP 142/90 HR 98 RR 20 O2sat 98% on RA CBG 370

## 2021-09-18 NOTE — ED Provider Triage Note (Signed)
Emergency Medicine Provider Triage Evaluation Note  Bryce Mendoza , a 61 y.o. male  was evaluated in triage.  Pt complains of abd pain. Upper abd pain x 3 days.  Feels similar to prior pancreatitis.  Denies any recent alcohol use.  Does endorse decreased appetite and haven't been eating much.  Haven't had BM x 3 days.  No fever, n/v  Review of Systems  Positive: As above Negative: As above  Physical Exam  BP 127/88 (BP Location: Right Arm)   Pulse 80   Temp 98.2 F (36.8 C) (Oral)   Resp 16   Ht 6\' 1"  (1.854 m)   Wt 106.6 kg   SpO2 99%   BMI 31.00 kg/m  Gen:   Awake, no distress   Resp:  Normal effort  MSK:   Moves extremities without difficulty  Other:    Medical Decision Making  Medically screening exam initiated at 10:11 PM.  Appropriate orders placed.  Bryce Mendoza was informed that the remainder of the evaluation will be completed by another provider, this initial triage assessment does not replace that evaluation, and the importance of remaining in the ED until their evaluation is complete.     [name], PA-C 09/18/21 2220

## 2021-09-19 ENCOUNTER — Encounter (HOSPITAL_COMMUNITY): Payer: Self-pay

## 2021-09-19 ENCOUNTER — Emergency Department (HOSPITAL_COMMUNITY): Payer: Medicaid Other

## 2021-09-19 DIAGNOSIS — F17210 Nicotine dependence, cigarettes, uncomplicated: Secondary | ICD-10-CM | POA: Diagnosis present

## 2021-09-19 DIAGNOSIS — K760 Fatty (change of) liver, not elsewhere classified: Secondary | ICD-10-CM | POA: Diagnosis not present

## 2021-09-19 DIAGNOSIS — Z91148 Patient's other noncompliance with medication regimen for other reason: Secondary | ICD-10-CM | POA: Diagnosis not present

## 2021-09-19 DIAGNOSIS — F1021 Alcohol dependence, in remission: Secondary | ICD-10-CM | POA: Diagnosis present

## 2021-09-19 DIAGNOSIS — K3184 Gastroparesis: Secondary | ICD-10-CM | POA: Diagnosis present

## 2021-09-19 DIAGNOSIS — Z79899 Other long term (current) drug therapy: Secondary | ICD-10-CM | POA: Diagnosis not present

## 2021-09-19 DIAGNOSIS — E1110 Type 2 diabetes mellitus with ketoacidosis without coma: Secondary | ICD-10-CM | POA: Diagnosis not present

## 2021-09-19 DIAGNOSIS — G894 Chronic pain syndrome: Secondary | ICD-10-CM

## 2021-09-19 DIAGNOSIS — E1143 Type 2 diabetes mellitus with diabetic autonomic (poly)neuropathy: Secondary | ICD-10-CM | POA: Diagnosis present

## 2021-09-19 DIAGNOSIS — K8591 Acute pancreatitis with uninfected necrosis, unspecified: Secondary | ICD-10-CM | POA: Diagnosis present

## 2021-09-19 DIAGNOSIS — Z7984 Long term (current) use of oral hypoglycemic drugs: Secondary | ICD-10-CM | POA: Diagnosis not present

## 2021-09-19 DIAGNOSIS — Z86711 Personal history of pulmonary embolism: Secondary | ICD-10-CM | POA: Diagnosis not present

## 2021-09-19 DIAGNOSIS — I700 Atherosclerosis of aorta: Secondary | ICD-10-CM | POA: Diagnosis not present

## 2021-09-19 DIAGNOSIS — K859 Acute pancreatitis without necrosis or infection, unspecified: Secondary | ICD-10-CM | POA: Diagnosis present

## 2021-09-19 DIAGNOSIS — Z885 Allergy status to narcotic agent status: Secondary | ICD-10-CM | POA: Diagnosis not present

## 2021-09-19 DIAGNOSIS — K861 Other chronic pancreatitis: Secondary | ICD-10-CM | POA: Diagnosis present

## 2021-09-19 DIAGNOSIS — T383X6A Underdosing of insulin and oral hypoglycemic [antidiabetic] drugs, initial encounter: Secondary | ICD-10-CM | POA: Diagnosis present

## 2021-09-19 DIAGNOSIS — R109 Unspecified abdominal pain: Secondary | ICD-10-CM | POA: Diagnosis not present

## 2021-09-19 DIAGNOSIS — Z794 Long term (current) use of insulin: Secondary | ICD-10-CM | POA: Diagnosis not present

## 2021-09-19 DIAGNOSIS — Z7901 Long term (current) use of anticoagulants: Secondary | ICD-10-CM | POA: Diagnosis not present

## 2021-09-19 DIAGNOSIS — G6181 Chronic inflammatory demyelinating polyneuritis: Secondary | ICD-10-CM | POA: Diagnosis present

## 2021-09-19 DIAGNOSIS — Z86718 Personal history of other venous thrombosis and embolism: Secondary | ICD-10-CM | POA: Diagnosis not present

## 2021-09-19 LAB — BASIC METABOLIC PANEL
Anion gap: 17 — ABNORMAL HIGH (ref 5–15)
Anion gap: 13 (ref 5–15)
Anion gap: 11 (ref 5–15)
Anion gap: 8 (ref 5–15)
Anion gap: 6 (ref 5–15)
BUN: 11 mg/dL (ref 6–20)
BUN: 10 mg/dL (ref 6–20)
BUN: 10 mg/dL (ref 6–20)
BUN: 10 mg/dL (ref 6–20)
BUN: 10 mg/dL (ref 6–20)
CO2: 14 mmol/L — ABNORMAL LOW (ref 22–32)
CO2: 13 mmol/L — ABNORMAL LOW (ref 22–32)
CO2: 16 mmol/L — ABNORMAL LOW (ref 22–32)
CO2: 16 mmol/L — ABNORMAL LOW (ref 22–32)
CO2: 17 mmol/L — ABNORMAL LOW (ref 22–32)
Calcium: 8.3 mg/dL — ABNORMAL LOW (ref 8.9–10.3)
Calcium: 8.5 mg/dL — ABNORMAL LOW (ref 8.9–10.3)
Calcium: 8.9 mg/dL (ref 8.9–10.3)
Calcium: 8.4 mg/dL — ABNORMAL LOW (ref 8.9–10.3)
Calcium: 8.5 mg/dL — ABNORMAL LOW (ref 8.9–10.3)
Chloride: 104 mmol/L (ref 98–111)
Chloride: 110 mmol/L (ref 98–111)
Chloride: 109 mmol/L (ref 98–111)
Chloride: 109 mmol/L (ref 98–111)
Chloride: 113 mmol/L — ABNORMAL HIGH (ref 98–111)
Creatinine, Ser: 0.93 mg/dL (ref 0.61–1.24)
Creatinine, Ser: 0.82 mg/dL (ref 0.61–1.24)
Creatinine, Ser: 0.74 mg/dL (ref 0.61–1.24)
Creatinine, Ser: 0.55 mg/dL — ABNORMAL LOW (ref 0.61–1.24)
Creatinine, Ser: 0.79 mg/dL (ref 0.61–1.24)
GFR, Estimated: >60 mL/min (ref >60)
GFR, Estimated: >60 mL/min (ref >60)
GFR, Estimated: >60 mL/min (ref >60)
GFR, Estimated: >60 mL/min (ref >60)
GFR, Estimated: >60 mL/min (ref >60)
Glucose, Bld: 265 mg/dL — ABNORMAL HIGH (ref 70–99)
Glucose, Bld: 111 mg/dL — ABNORMAL HIGH (ref 70–99)
Glucose, Bld: 104 mg/dL — ABNORMAL HIGH (ref 70–99)
Glucose, Bld: 187 mg/dL — ABNORMAL HIGH (ref 70–99)
Glucose, Bld: 138 mg/dL — ABNORMAL HIGH (ref 70–99)
Potassium: 3.7 mmol/L (ref 3.5–5.1)
Potassium: 2.7 mmol/L — CL (ref 3.5–5.1)
Potassium: 3.4 mmol/L — ABNORMAL LOW (ref 3.5–5.1)
Potassium: 3.6 mmol/L (ref 3.5–5.1)
Potassium: 3.5 mmol/L (ref 3.5–5.1)
Sodium: 135 mmol/L (ref 135–145)
Sodium: 136 mmol/L (ref 135–145)
Sodium: 136 mmol/L (ref 135–145)
Sodium: 133 mmol/L — ABNORMAL LOW (ref 135–145)
Sodium: 136 mmol/L (ref 135–145)

## 2021-09-19 LAB — GLUCOSE, CAPILLARY
Glucose-Capillary: 124 mg/dL — ABNORMAL HIGH (ref 70–99)
Glucose-Capillary: 33 mg/dL — CL (ref 70–99)
Glucose-Capillary: 35 mg/dL — CL (ref 70–99)
Glucose-Capillary: 117 mg/dL — ABNORMAL HIGH (ref 70–99)
Glucose-Capillary: 158 mg/dL — ABNORMAL HIGH (ref 70–99)
Glucose-Capillary: 112 mg/dL — ABNORMAL HIGH (ref 70–99)
Glucose-Capillary: 112 mg/dL — ABNORMAL HIGH (ref 70–99)
Glucose-Capillary: 195 mg/dL — ABNORMAL HIGH (ref 70–99)
Glucose-Capillary: 221 mg/dL — ABNORMAL HIGH (ref 70–99)
Glucose-Capillary: 184 mg/dL — ABNORMAL HIGH (ref 70–99)
Glucose-Capillary: 143 mg/dL — ABNORMAL HIGH (ref 70–99)
Glucose-Capillary: 168 mg/dL — ABNORMAL HIGH (ref 70–99)
Glucose-Capillary: 158 mg/dL — ABNORMAL HIGH (ref 70–99)
Glucose-Capillary: 165 mg/dL — ABNORMAL HIGH (ref 70–99)
Glucose-Capillary: 136 mg/dL — ABNORMAL HIGH (ref 70–99)

## 2021-09-19 LAB — COMPREHENSIVE METABOLIC PANEL
ALT: 18 U/L (ref 0–44)
AST: 21 U/L (ref 15–41)
Albumin: 4.2 g/dL (ref 3.5–5.0)
Alkaline Phosphatase: 73 U/L (ref 38–126)
Anion gap: >20 — ABNORMAL HIGH (ref 5–15)
BUN: 13 mg/dL (ref 6–20)
CO2: 12 mmol/L — ABNORMAL LOW (ref 22–32)
Calcium: 9.3 mg/dL (ref 8.9–10.3)
Chloride: 97 mmol/L — ABNORMAL LOW (ref 98–111)
Creatinine, Ser: 0.95 mg/dL (ref 0.61–1.24)
GFR, Estimated: >60 mL/min (ref >60)
Glucose, Bld: 343 mg/dL — ABNORMAL HIGH (ref 70–99)
Potassium: 3.2 mmol/L — ABNORMAL LOW (ref 3.5–5.1)
Sodium: 131 mmol/L — ABNORMAL LOW (ref 135–145)
Total Bilirubin: 1.6 mg/dL — ABNORMAL HIGH (ref 0.3–1.2)
Total Protein: 7.7 g/dL (ref 6.5–8.1)

## 2021-09-19 LAB — MRSA NEXT GEN BY PCR, NASAL: MRSA by PCR Next Gen: DETECTED — AB

## 2021-09-19 LAB — BETA-HYDROXYBUTYRIC ACID
Beta-Hydroxybutyric Acid: >8 mmol/L — ABNORMAL HIGH (ref 0.05–0.27)
Beta-Hydroxybutyric Acid: 7.4 mmol/L — ABNORMAL HIGH (ref 0.05–0.27)
Beta-Hydroxybutyric Acid: 2.84 mmol/L — ABNORMAL HIGH (ref 0.05–0.27)
Beta-Hydroxybutyric Acid: 0.49 mmol/L — ABNORMAL HIGH (ref 0.05–0.27)

## 2021-09-19 LAB — BLOOD GAS, VENOUS
Acid-base deficit: 14.9 mmol/L — ABNORMAL HIGH (ref 0.0–2.0)
Bicarbonate: 11.6 mmol/L — ABNORMAL LOW (ref 20.0–28.0)
O2 Saturation: 37.1 %
Patient temperature: 37
pCO2, Ven: 29 mmHg — ABNORMAL LOW (ref 44–60)
pH, Ven: 7.21 — ABNORMAL LOW (ref 7.25–7.43)
pO2, Ven: <31 mmHg — CL (ref 32–45)

## 2021-09-19 LAB — LIPASE, BLOOD: Lipase: 286 U/L — ABNORMAL HIGH (ref 11–51)

## 2021-09-19 LAB — CBG MONITORING, ED
Glucose-Capillary: 271 mg/dL — ABNORMAL HIGH (ref 70–99)
Glucose-Capillary: 104 mg/dL — ABNORMAL HIGH (ref 70–99)

## 2021-09-19 LAB — HEMOGLOBIN A1C
Hgb A1c MFr Bld: 11.5 % — ABNORMAL HIGH (ref 4.8–5.6)
Mean Plasma Glucose: 283.35 mg/dL

## 2021-09-19 LAB — MAGNESIUM: Magnesium: 1.6 mg/dL — ABNORMAL LOW (ref 1.7–2.4)

## 2021-09-19 LAB — HIV ANTIBODY (ROUTINE TESTING W REFLEX): HIV Screen 4th Generation wRfx: NONREACTIVE

## 2021-09-19 MED ORDER — ENOXAPARIN SODIUM 40 MG/0.4ML IJ SOSY
40.0000 mg | PREFILLED_SYRINGE | INTRAMUSCULAR | Status: DC
  Administered 2021-09-19 – 2021-09-21 (×3): 40 mg via SUBCUTANEOUS
  Filled 2021-09-19 (×4): qty 0.4

## 2021-09-19 MED ORDER — SODIUM CHLORIDE (PF) 0.9 % IJ SOLN
INTRAMUSCULAR | Status: AC
  Filled 2021-09-19: qty 50

## 2021-09-19 MED ORDER — IOHEXOL 300 MG/ML  SOLN
100.0000 mL | Freq: Once | INTRAMUSCULAR | Status: AC | PRN
  Administered 2021-09-19: 100 mL via INTRAVENOUS

## 2021-09-19 MED ORDER — POTASSIUM CHLORIDE 10 MEQ/100ML IV SOLN
10.0000 meq | INTRAVENOUS | Status: AC
  Administered 2021-09-19 (×6): 10 meq via INTRAVENOUS
  Filled 2021-09-19 (×6): qty 100

## 2021-09-19 MED ORDER — SODIUM CHLORIDE 0.9 % IV BOLUS
1000.0000 mL | Freq: Once | INTRAVENOUS | Status: AC
  Administered 2021-09-19: 1000 mL via INTRAVENOUS

## 2021-09-19 MED ORDER — POTASSIUM CHLORIDE 10 MEQ/100ML IV SOLN
10.0000 meq | INTRAVENOUS | Status: AC
  Administered 2021-09-19: 10 meq via INTRAVENOUS
  Filled 2021-09-19: qty 100

## 2021-09-19 MED ORDER — POTASSIUM CHLORIDE 10 MEQ/100ML IV SOLN
10.0000 meq | INTRAVENOUS | Status: DC
  Administered 2021-09-19: 10 meq via INTRAVENOUS
  Filled 2021-09-19: qty 100

## 2021-09-19 MED ORDER — KETOROLAC TROMETHAMINE 30 MG/ML IJ SOLN
30.0000 mg | Freq: Once | INTRAMUSCULAR | Status: AC
  Administered 2021-09-19: 30 mg via INTRAVENOUS
  Filled 2021-09-19: qty 1

## 2021-09-19 MED ORDER — KETOROLAC TROMETHAMINE 30 MG/ML IJ SOLN
30.0000 mg | Freq: Four times a day (QID) | INTRAMUSCULAR | Status: DC | PRN
Start: 2021-09-19 — End: 2021-09-22
  Administered 2021-09-19 – 2021-09-22 (×8): 30 mg via INTRAVENOUS
  Filled 2021-09-19 (×8): qty 1

## 2021-09-19 MED ORDER — ONDANSETRON HCL 4 MG/2ML IJ SOLN
4.0000 mg | Freq: Four times a day (QID) | INTRAMUSCULAR | Status: DC | PRN
  Administered 2021-09-19 – 2021-09-20 (×2): 4 mg via INTRAVENOUS
  Filled 2021-09-19 (×2): qty 2

## 2021-09-19 MED ORDER — ADULT MULTIVITAMIN W/MINERALS CH
1.0000 | ORAL_TABLET | Freq: Every day | ORAL | Status: DC
Start: 2021-09-19 — End: 2021-09-22
  Administered 2021-09-19 – 2021-09-22 (×4): 1 via ORAL
  Filled 2021-09-19 (×4): qty 1

## 2021-09-19 MED ORDER — HYDROMORPHONE HCL 1 MG/ML IJ SOLN
1.0000 mg | Freq: Once | INTRAMUSCULAR | Status: AC
  Administered 2021-09-19: 1 mg via INTRAVENOUS
  Filled 2021-09-19: qty 1

## 2021-09-19 MED ORDER — DEXTROSE 50 % IV SOLN: 0.0000 mL | INTRAVENOUS | Status: DC | PRN

## 2021-09-19 MED ORDER — ATORVASTATIN CALCIUM 10 MG PO TABS
20.0000 mg | ORAL_TABLET | Freq: Every day | ORAL | Status: DC
  Administered 2021-09-19 – 2021-09-22 (×4): 20 mg via ORAL
  Filled 2021-09-19 (×4): qty 2

## 2021-09-19 MED ORDER — LACTATED RINGERS IV SOLN: INTRAVENOUS | Status: DC

## 2021-09-19 MED ORDER — POTASSIUM CHLORIDE 10 MEQ/100ML IV SOLN
10.0000 meq | Freq: Once | INTRAVENOUS | Status: AC
  Administered 2021-09-19: 10 meq via INTRAVENOUS
  Filled 2021-09-19: qty 100

## 2021-09-19 MED ORDER — CHLORHEXIDINE GLUCONATE CLOTH 2 % EX PADS
6.0000 | MEDICATED_PAD | Freq: Every day | CUTANEOUS | Status: DC
Start: 2021-09-19 — End: 2021-09-20
  Administered 2021-09-19: 6 via TOPICAL

## 2021-09-19 MED ORDER — DICYCLOMINE HCL 10 MG PO CAPS: 10.0000 mg | ORAL_CAPSULE | Freq: Every day | ORAL | Status: DC | PRN

## 2021-09-19 MED ORDER — DEXTROSE 50 % IV SOLN
25.0000 g | INTRAVENOUS | Status: AC
  Administered 2021-09-19: 25 g via INTRAVENOUS

## 2021-09-19 MED ORDER — DEXTROSE IN LACTATED RINGERS 5 % IV SOLN: INTRAVENOUS | Status: DC

## 2021-09-19 MED ORDER — PANCRELIPASE (LIP-PROT-AMYL) 12000-38000 UNITS PO CPEP
24000.0000 [IU] | ORAL_CAPSULE | Freq: Three times a day (TID) | ORAL | Status: DC
  Filled 2021-09-19: qty 2

## 2021-09-19 MED ORDER — HYDROMORPHONE HCL 1 MG/ML IJ SOLN
0.5000 mg | INTRAMUSCULAR | Status: DC | PRN
  Administered 2021-09-19 – 2021-09-21 (×13): 1 mg via INTRAVENOUS
  Filled 2021-09-19 (×14): qty 1

## 2021-09-19 MED ORDER — INSULIN REGULAR(HUMAN) IN NACL 100-0.9 UT/100ML-% IV SOLN
INTRAVENOUS | Status: DC
Start: 2021-09-19 — End: 2021-09-22
  Administered 2021-09-19: 16 [IU]/h via INTRAVENOUS
  Administered 2021-09-20: 0.5 [IU]/h via INTRAVENOUS
  Filled 2021-09-19 (×2): qty 100

## 2021-09-19 MED ORDER — ONDANSETRON HCL 4 MG/2ML IJ SOLN
4.0000 mg | Freq: Once | INTRAMUSCULAR | Status: AC
  Administered 2021-09-19: 4 mg via INTRAVENOUS
  Filled 2021-09-19: qty 2

## 2021-09-19 NOTE — Assessment & Plan Note (Addendum)
1. Noted.

## 2021-09-19 NOTE — Assessment & Plan Note (Addendum)
DVT/PE in 2019. No longer on eliquis per med-rec. Pt not sure if doc took him off of this or if he just didn't get it refilled. Return to Eliquis is deferred to PCP.

## 2021-09-19 NOTE — Assessment & Plan Note (Addendum)
Patient states not drinking any more. I'm suspicious this was the cause of his prior necrotizing pancreatitis in the past.
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
## 2021-09-19 NOTE — Inpatient Diabetes Management (Signed)
Inpatient Diabetes Program Recommendations  AACE/ADA: New Consensus Statement on Inpatient Glycemic Control (2015)  Target Ranges:  Prepandial:   less than 140 mg/dL      Peak postprandial:   less than 180 mg/dL (1-2 hours)      Critically ill patients:  140 - 180 mg/dL   Lab Results  Component Value Date   GLUCAP 112 (H) 09/19/2021   HGBA1C 11.5 (H) 09/18/2021    Review of Glycemic Control  Diabetes history: DM2 Outpatient Diabetes medications: Lantus 18 units QD and 8 units QHS,  glipizide 10 mg QAM, metformin 500 mg BID (no meds for > 1 month) Current orders for Inpatient glycemic control: IV insulin per [employer] for DKA  CO2 still 16, not ready to transition quite yet  Inpatient Diabetes Program Recommendations:    When criteria met for discontinuation of drip:  Semglee 15 units QD (give 1-2 hours prior to discontinuation of drip) Novolog 0-9 units Q4 x 12H, then TID with meals and 0-5 HS  Spoke with pt at bedside regarding his diabetes and HgbA1C of 11.5%. Pt states he hasn't taken insulin in over a month, has not followed up with PCP, and could not get a prescription. Pt states he only needs prescription for Lantus Solostar. Pt said he's been under a lot of stress lately and knows what to do, as far as diabetes, but hasn't felt motivated to take care of himself. Encouraged pt to monitor blood sugars and take insulin, meds as prescribed. Discussed HgbA1C goals and importance of keeping blood sugars in control to reduce risks of both long and short-term complications. Pt voiced understanding.   Will f/u in am.   Thank you. Lorenda Peck, RD, LDN, CDE Inpatient Diabetes Coordinator [phone]

## 2021-09-19 NOTE — Progress Notes (Signed)
The patient is a 61 yr old man who states that he began to have abdominal pain 5 days ago. The patient has chronic pancreatitis, and he states that this feels likel his pancreatitis. However, the patient is also an insulin dependent diabetic due to pancreatic insufficiency ,and has not been taking his insulin for more than a month.   In the ED the patient was found to be in DKA with pH of 7.21, CO2 of 12, and anion gap greater than 20. Lipase was elevated at 286. CT abdomen and pelvis demonstrated peripancreatic fat stranding about the body and tail. There are two cysts in the pancreas; one in the tail and the other in th body that is not significantly changed from the prior exam. These are felt to be sequela of previous necrotizing pancreatitis.  Lactic acid was 2.1.  The patient was admitted by my colleague earlier this morning and placed on DKA protocol. His fluids have been changed to D5 LR at 175/hr in order to maintain his glucose while continuing his insulin drip.  I have seen and examined this patient myself. I have reviewed his labwork, imagery, and vitals.

## 2021-09-19 NOTE — ED Notes (Signed)
Lab called to report the PO2 on the VBG was less than 3.1. Provider notified

## 2021-09-19 NOTE — Assessment & Plan Note (Addendum)
Adult onset IDDM, I suspect pancreatic insufficiency related to his history of necrotizing pancreatitis in past / chronic and recurrent pancreatitis. H/o DKA previously. Known h/o Pancreatic insufficiency (looks like hes on Creon with meals). DKA today is simply from not taking insulin for prolonged period of time (pt states ~1 month).  Resolved. The patient was discharged on Lantus and exhorted to seek refills of his medications before he is totally out. He was told that his insulin is absolutely critical for him.

## 2021-09-19 NOTE — ED Notes (Signed)
Collected VBG on pt. Pt reports he is in pain. Pain reassess pt gives 6 out of 10.  Provider notified and pain medication ordered.

## 2021-09-19 NOTE — ED Provider Notes (Signed)
[place] DEPT Provider Note   CSN: [id number] Arrival date & time: 09/18/21  1958     History  Chief Complaint  Patient presents with   Abdominal Pain    Bryce Mendoza is a 61 y.o. male.  The history is provided by the patient and medical records.  Abdominal Pain  61 y.o. M with history of CIDP, DM2, obesity, chronic pain, pancreatitis, diabetic gastroparesis, presenting to the ED with abdominal pain.  Per report, ongoing for the past 5 days.  Pain stays localized to the epigastrium and feels like prior episodes of pancreatitis.  He denies any nausea or vomiting.  No fever or chills.  Does have history of pancreatitis in the past.  He denies any recent alcohol use.  Home Medications Prior to Admission medications   Medication Sig Start Date End Date Taking? Authorizing Provider  atorvastatin (LIPITOR) 20 MG tablet Take 3 tablets (60 mg total) by mouth daily. Patient taking differently: Take 20 mg by mouth daily. 03/25/21  Yes Allie Bossier, MD  dicyclomine (BENTYL) 10 MG capsule Take 1 capsule (10 mg total) by mouth 3 (three) times daily before meals. Patient taking differently: Take 10 mg by mouth daily as needed (abdominal cramps). 11/04/20 09/20/22 Yes Barb Merino, MD  lipase/protease/amylase (CREON) 12000-38000 units CPEP capsule Take 2 capsules (24,000 Units total) by mouth 3 (three) times daily before meals. 12/14/20  Yes Kathie Dike, MD  losartan (COZAAR) 25 MG tablet Take 25 mg by mouth daily.   Yes [provider]  Multiple Vitamin (MULTIVITAMIN) tablet Take 1 tablet by mouth daily.   Yes [provider]  omeprazole (PRILOSEC) 40 MG capsule Take 40 mg by mouth daily as needed (for acid reflux). 03/13/21  Yes [provider]  Vitamin D, Ergocalciferol, (DRISDOL) 1.25 MG (50000 UNIT) CAPS capsule Take 50,000 Units by mouth once a week. Tuesday 03/13/21  Yes [provider]  Accu-Chek Softclix Lancets  lancets Use as instructed 01/03/21   Argentina Donovan, PA-C  apixaban (ELIQUIS) 5 MG TABS tablet Take 1 tablet (5 mg total) by mouth 2 (two) times daily. Patient not taking: Reported on 09/19/2021 07/11/20   [name], NP  blood glucose meter kit and supplies KIT Dispense based on patient and insurance preference. Use up to four times daily as directed. 10/09/20   Barb Merino, MD  Blood Glucose Monitoring Suppl (ACCU-CHEK GUIDE ME) w/Device KIT 1 each by Does not apply route 2 (two) times daily. 01/03/21   Argentina Donovan, PA-C  Continuous Blood Gluc Receiver (FREESTYLE LIBRE 14 DAY READER) DEVI Use as instructed. Check blood glucose level by fingerstick 3times per day. E11.65 G61.81 G60.9 10/23/20   [name], NP  Continuous Blood Gluc Sensor (FREESTYLE LIBRE 14 DAY SENSOR) MISC Use as instructed. Check blood glucose level by fingerstick 3times per day. E11.65 G61.81 G60.9 10/23/20   [name], NP  glipiZIDE (GLUCOTROL) 10 MG tablet Take 1 tablet (10 mg total) by mouth daily before breakfast. Patient not taking: Reported on 09/19/2021 03/25/21   Allie Bossier, MD  glucose blood (ACCU-CHEK GUIDE) test strip Check blood sugar once daily. 01/03/21   Argentina Donovan, PA-C  insulin glargine (LANTUS) 100 UNIT/ML Solostar Pen Inject 18 Units into the skin daily. Patient not taking: Reported on 09/19/2021 03/24/21   Allie Bossier, MD  Insulin Pen Needle (PEN NEEDLES 31GX5/16") 31G X 8 MM MISC 1 each by Does not apply route daily. 03/21/21  Freeman Caldron M, PA-C  metFORMIN (GLUCOPHAGE-XR) 500 MG 24 hr tablet Take 1 tablet (500 mg total) by mouth in the morning and at bedtime. Patient not taking: Reported on 09/19/2021 03/21/21   Argentina Donovan, PA-C  metoCLOPramide (REGLAN) 5 MG tablet Take 1 tablet (5 mg total) by mouth every 8 (eight) hours as needed for nausea. Patient not taking: Reported on 09/19/2021 01/03/21 01/03/22  Argentina Donovan, PA-C      Allergies    Morphine     Review of Systems   Review of Systems  Gastrointestinal:  Positive for abdominal pain.  All other systems reviewed and are negative.   Physical Exam Updated Vital Signs BP 115/74   Pulse (!) 58   Temp 98.2 F (36.8 C) (Oral)   Resp 16   Ht '6\' 1"'  (1.854 m)   Wt 106.6 kg   SpO2 99%   BMI 31.00 kg/m   Physical Exam Vitals and nursing note reviewed.  Constitutional:      Appearance: He is well-developed.  HENT:     Head: Normocephalic and atraumatic.  Eyes:     Conjunctiva/sclera: Conjunctivae normal.     Pupils: Pupils are equal, round, and reactive to light.  Cardiovascular:     Rate and Rhythm: Normal rate and regular rhythm.     Heart sounds: Normal heart sounds.  Pulmonary:     Effort: Pulmonary effort is normal.     Breath sounds: Normal breath sounds.  Abdominal:     General: Bowel sounds are normal.     Palpations: Abdomen is soft.     Tenderness: There is abdominal tenderness in the epigastric area. There is no guarding or rebound.  Musculoskeletal:        General: Normal range of motion.     Cervical back: Normal range of motion.  Skin:    General: Skin is warm and dry.  Neurological:     Mental Status: He is alert and oriented to person, place, and time.     ED Results / Procedures / Treatments   Labs (all labs ordered are listed, but only abnormal results are displayed) Labs Reviewed  CBC WITH DIFFERENTIAL/PLATELET - Abnormal; Notable for the following components:      Result Value   MCV 102.7 (*)    MCH 36.6 (*)    All other components within normal limits  URINALYSIS, ROUTINE W REFLEX MICROSCOPIC - Abnormal; Notable for the following components:   Glucose, UA >=500 (*)    Ketones, ur 80 (*)    All other components within normal limits  COMPREHENSIVE METABOLIC PANEL - Abnormal; Notable for the following components:   Sodium 131 (*)    Potassium 3.2 (*)    Chloride 97 (*)    CO2 12 (*)    Glucose, Bld 343 (*)    Total Bilirubin 1.6 (*)     Anion gap >20 (*)    All other components within normal limits  LIPASE, BLOOD - Abnormal; Notable for the following components:   Lipase 286 (*)    All other components within normal limits  BLOOD GAS, VENOUS - Abnormal; Notable for the following components:   pH, Ven 7.21 (*)    pCO2, Ven 29 (*)    pO2, Ven <31 (*)    Bicarbonate 11.6 (*)    Acid-base deficit 14.9 (*)    All other components within normal limits  BETA-HYDROXYBUTYRIC ACID - Abnormal; Notable for the following components:   Beta-Hydroxybutyric Acid >8.00 (*)  All other components within normal limits  HEMOGLOBIN A1C    EKG None  Radiology CT ABDOMEN PELVIS W CONTRAST  Result Date: 09/19/2021 CLINICAL DATA:  Left upper quadrant abdominal pain, history of pancreatitis. EXAM: CT ABDOMEN AND PELVIS WITH CONTRAST TECHNIQUE: Multidetector CT imaging of the abdomen and pelvis was performed using the standard protocol following bolus administration of intravenous contrast. RADIATION DOSE REDUCTION: This exam was performed according to the departmental dose-optimization program which includes automated exposure control, adjustment of the mA and/or kV according to patient size and/or use of iterative reconstruction technique. CONTRAST:  125m OMNIPAQUE IOHEXOL 300 MG/ML  SOLN COMPARISON:  03/20/2021. FINDINGS: Lower chest: Strandy atelectasis or scarring is present at the left lung base. Hepatobiliary: No focal liver abnormality is seen. Fatty infiltration is noted. No gallstones, gallbladder wall thickening, or biliary dilatation. Pancreas: There is peripancreatic fat stranding about the body and tail. A cyst is present in the head of the pancreas measuring 3.2 cm, increased in size from the prior exam. A stable complex fluid collection is noted in the pancreatic body and tail measuring, not significantly changed from the prior exam, and likely sequela previous necrotizing pancreatitis. Spleen: Normal in size without focal  abnormality. Adrenals/Urinary Tract: No adrenal nodule or mass. The kidneys enhance symmetrically. No renal calculus or hydronephrosis. The bladder is unremarkable. Stomach/Bowel: Stomach is within normal limits. Appendix appears normal. No evidence of bowel wall thickening, distention, or inflammatory changes. No free air or pneumatosis. Vascular/Lymphatic: Aortic atherosclerosis. No enlarged abdominal or pelvic lymph nodes. Reproductive: Prostate is unremarkable. Other: Fat containing inguinal hernias are noted bilaterally. There is a fat containing umbilical hernia. No abdominopelvic ascites. Musculoskeletal: Degenerative changes are present in the thoracolumbar spine. No acute osseous abnormality. IMPRESSION: 1. Peripancreatic fat stranding, suggesting acute pancreatitis. A complex hypodense collection in the body and tail of the pancreas is stable. There is a cyst in the head of the pancreas now measuring 3.2 cm, increased in size from the prior exam. 2. Hepatic steatosis. 3. Aortic atherosclerosis. Electronically Signed   By: LBrett FairyM.D.   On: 09/19/2021 03:28    Procedures Procedures    CRITICAL CARE Performed by: LLarene Pickett  Total critical care time: 45 minutes  Critical care time was exclusive of separately billable procedures and treating other patients.  Critical care was necessary to treat or prevent imminent or life-threatening deterioration.  Critical care was time spent personally by me on the following activities: development of treatment plan with patient and/or surrogate as well as nursing, discussions with consultants, evaluation of patient's response to treatment, examination of patient, obtaining history from patient or surrogate, ordering and performing treatments and interventions, ordering and review of laboratory studies, ordering and review of radiographic studies, pulse oximetry and re-evaluation of patient's condition.   Medications Ordered in  ED Medications  insulin regular, human (MYXREDLIN) 100 units/ 100 mL infusion (has no administration in time range)  lactated ringers infusion (has no administration in time range)  dextrose 5 % in lactated ringers infusion (has no administration in time range)  dextrose 50 % solution 0-50 mL (has no administration in time range)  potassium chloride 10 mEq in 100 mL IVPB (10 mEq Intravenous New Bag/Given 09/19/21 0439)  HYDROmorphone (DILAUDID) injection 1 mg (1 mg Intravenous Given 09/19/21 0220)  sodium chloride 0.9 % bolus 1,000 mL (1,000 mLs Intravenous New Bag/Given 09/19/21 0221)  ondansetron (ZOFRAN) injection 4 mg (4 mg Intravenous Given 09/19/21 0221)  iohexol (OMNIPAQUE) 300 MG/ML  solution 100 mL (100 mLs Intravenous Contrast Given 09/19/21 0257)  sodium chloride (PF) 0.9 % injection (  Given 09/19/21 0311)  HYDROmorphone (DILAUDID) injection 1 mg (1 mg Intravenous Given 09/19/21 0332)  sodium chloride 0.9 % bolus 1,000 mL (1,000 mLs Intravenous New Bag/Given 09/19/21 6803)    ED Course/ Medical Decision Making/ A&P                           Medical Decision Making Amount and/or Complexity of Data Reviewed External Data Reviewed: labs, radiology, ECG and notes. Labs: ordered. ECG/medicine tests: ordered and independent interpretation performed.  Risk Prescription drug management. Decision regarding hospitalization.   61 year old male presenting to the ED with abdominal pain for the past 5 days.  Pain localized to epigastric region, states it feels like prior pancreatitis.  He denies any nausea or vomiting but has had poor appetite.  He does have tenderness in the epigastrium on exam but no rebound or guarding.  Labs and CT ordered from triage.  Initiate IV fluids, pain control, Zofran  Labs delayed as initial sample hemolyzed and required recollect during downtime.  CBC without leukocytosis.  Chemistry with values concerning for DKA-- glucose 343, bicarb 12, gap >20.  Additional IVF  given.  CT still pending.  Will add on VBG, b-hydroxy, A1c.  CT with findings of acute pancreatitis, stable fluid collection and pancreatic cyst noted.  pH 7.2.  b-hydroxy >8.  Results discussed with patient, he does admit that he was notified his insulin did not have any further refills at the pharmacy for the past month but has yet to see his PCP.  States he is still been checking sugars at home and readings have not been over 300.  Will continue IVF, patient started on insulin drip.  He will require admission.  Discussed with Dr. Gloris Ham-- will admit for ongoing care.  Final Clinical Impression(s) / ED Diagnoses Final diagnoses:  Diabetic ketoacidosis without coma associated with type 2 diabetes mellitus ([place])  Acute pancreatitis, unspecified complication status, unspecified pancreatitis type    Rx / DC Orders ED Discharge Orders     None         Larene Pickett, PA-C 09/19/21 2122    Veryl Speak, MD 09/19/21 [phone]

## 2021-09-19 NOTE — Assessment & Plan Note (Addendum)
Doesn't appear to be on the pamelor anymore based on med-rec. Not clear if he got taken off the pamelor by physician, or just didn't follow up / get refill. Possible that neurology just felt he didn't actually have CIDP? I see a note from [facility] in May 2022 where he had normal EMG testing findings. 1. Will hold off on re-ordering for the moment.

## 2021-09-19 NOTE — Assessment & Plan Note (Addendum)
Patient with acute pancreatitis today based on lipase and CT findings.  Also chronic pancreatitis with 2 pseudocysts (1 stable, and 1 growing). The patient was initially made NPO, then his diet advanced as tolerated.  1. Probably needs GI follow up at a minimum at this point regarding those pseudocysts (1 unresolving, 1 growing). 2. The patient is to continue Creon as prior to admission.

## 2021-09-20 LAB — BASIC METABOLIC PANEL
Anion gap: 7 (ref 5–15)
Anion gap: 8 (ref 5–15)
Anion gap: 6 (ref 5–15)
Anion gap: 6 (ref 5–15)
Anion gap: 4 — ABNORMAL LOW (ref 5–15)
BUN: 10 mg/dL (ref 6–20)
BUN: 9 mg/dL (ref 6–20)
BUN: 9 mg/dL (ref 6–20)
BUN: 10 mg/dL (ref 6–20)
BUN: 8 mg/dL (ref 6–20)
CO2: 18 mmol/L — ABNORMAL LOW (ref 22–32)
CO2: 16 mmol/L — ABNORMAL LOW (ref 22–32)
CO2: 17 mmol/L — ABNORMAL LOW (ref 22–32)
CO2: 18 mmol/L — ABNORMAL LOW (ref 22–32)
CO2: 18 mmol/L — ABNORMAL LOW (ref 22–32)
Calcium: 8.5 mg/dL — ABNORMAL LOW (ref 8.9–10.3)
Calcium: 8.4 mg/dL — ABNORMAL LOW (ref 8.9–10.3)
Calcium: 8.6 mg/dL — ABNORMAL LOW (ref 8.9–10.3)
Calcium: 8.5 mg/dL — ABNORMAL LOW (ref 8.9–10.3)
Calcium: 8.4 mg/dL — ABNORMAL LOW (ref 8.9–10.3)
Chloride: 112 mmol/L — ABNORMAL HIGH (ref 98–111)
Chloride: 113 mmol/L — ABNORMAL HIGH (ref 98–111)
Chloride: 114 mmol/L — ABNORMAL HIGH (ref 98–111)
Chloride: 114 mmol/L — ABNORMAL HIGH (ref 98–111)
Chloride: 115 mmol/L — ABNORMAL HIGH (ref 98–111)
Creatinine, Ser: 0.77 mg/dL (ref 0.61–1.24)
Creatinine, Ser: 0.7 mg/dL (ref 0.61–1.24)
Creatinine, Ser: 0.57 mg/dL — ABNORMAL LOW (ref 0.61–1.24)
Creatinine, Ser: 0.63 mg/dL (ref 0.61–1.24)
Creatinine, Ser: 0.62 mg/dL (ref 0.61–1.24)
GFR, Estimated: >60 mL/min (ref >60)
GFR, Estimated: >60 mL/min (ref >60)
GFR, Estimated: >60 mL/min (ref >60)
GFR, Estimated: >60 mL/min (ref >60)
GFR, Estimated: >60 mL/min (ref >60)
Glucose, Bld: 154 mg/dL — ABNORMAL HIGH (ref 70–99)
Glucose, Bld: 169 mg/dL — ABNORMAL HIGH (ref 70–99)
Glucose, Bld: 166 mg/dL — ABNORMAL HIGH (ref 70–99)
Glucose, Bld: 228 mg/dL — ABNORMAL HIGH (ref 70–99)
Glucose, Bld: 129 mg/dL — ABNORMAL HIGH (ref 70–99)
Potassium: 3.3 mmol/L — ABNORMAL LOW (ref 3.5–5.1)
Potassium: 3.4 mmol/L — ABNORMAL LOW (ref 3.5–5.1)
Potassium: 3.3 mmol/L — ABNORMAL LOW (ref 3.5–5.1)
Potassium: 3.9 mmol/L (ref 3.5–5.1)
Potassium: 4 mmol/L (ref 3.5–5.1)
Sodium: 137 mmol/L (ref 135–145)
Sodium: 137 mmol/L (ref 135–145)
Sodium: 137 mmol/L (ref 135–145)
Sodium: 138 mmol/L (ref 135–145)
Sodium: 137 mmol/L (ref 135–145)

## 2021-09-20 LAB — BETA-HYDROXYBUTYRIC ACID
Beta-Hydroxybutyric Acid: 0.74 mmol/L — ABNORMAL HIGH (ref 0.05–0.27)
Beta-Hydroxybutyric Acid: 0.48 mmol/L — ABNORMAL HIGH (ref 0.05–0.27)

## 2021-09-20 LAB — GLUCOSE, CAPILLARY
Glucose-Capillary: 112 mg/dL — ABNORMAL HIGH (ref 70–99)
Glucose-Capillary: 119 mg/dL — ABNORMAL HIGH (ref 70–99)
Glucose-Capillary: 125 mg/dL — ABNORMAL HIGH (ref 70–99)
Glucose-Capillary: 143 mg/dL — ABNORMAL HIGH (ref 70–99)
Glucose-Capillary: 145 mg/dL — ABNORMAL HIGH (ref 70–99)
Glucose-Capillary: 145 mg/dL — ABNORMAL HIGH (ref 70–99)
Glucose-Capillary: 146 mg/dL — ABNORMAL HIGH (ref 70–99)
Glucose-Capillary: 152 mg/dL — ABNORMAL HIGH (ref 70–99)
Glucose-Capillary: 153 mg/dL — ABNORMAL HIGH (ref 70–99)
Glucose-Capillary: 153 mg/dL — ABNORMAL HIGH (ref 70–99)
Glucose-Capillary: 158 mg/dL — ABNORMAL HIGH (ref 70–99)
Glucose-Capillary: 160 mg/dL — ABNORMAL HIGH (ref 70–99)
Glucose-Capillary: 161 mg/dL — ABNORMAL HIGH (ref 70–99)
Glucose-Capillary: 167 mg/dL — ABNORMAL HIGH (ref 70–99)
Glucose-Capillary: 168 mg/dL — ABNORMAL HIGH (ref 70–99)
Glucose-Capillary: 169 mg/dL — ABNORMAL HIGH (ref 70–99)
Glucose-Capillary: 183 mg/dL — ABNORMAL HIGH (ref 70–99)
Glucose-Capillary: 210 mg/dL — ABNORMAL HIGH (ref 70–99)
Glucose-Capillary: 226 mg/dL — ABNORMAL HIGH (ref 70–99)
Glucose-Capillary: 259 mg/dL — ABNORMAL HIGH (ref 70–99)

## 2021-09-20 LAB — MAGNESIUM: Magnesium: 1.5 mg/dL — ABNORMAL LOW (ref 1.7–2.4)

## 2021-09-20 MED ORDER — POTASSIUM CHLORIDE 10 MEQ/100ML IV SOLN
10.0000 meq | INTRAVENOUS | Status: AC
  Administered 2021-09-20 (×4): 10 meq via INTRAVENOUS
  Filled 2021-09-20 (×4): qty 100

## 2021-09-20 MED ORDER — CHLORHEXIDINE GLUCONATE CLOTH 2 % EX PADS
6.0000 | MEDICATED_PAD | Freq: Every day | CUTANEOUS | Status: DC
  Administered 2021-09-20 – 2021-09-22 (×3): 6 via TOPICAL

## 2021-09-20 MED ORDER — THIAMINE HCL 100 MG PO TABS
100.0000 mg | ORAL_TABLET | Freq: Every day | ORAL | Status: DC
  Administered 2021-09-21 – 2021-09-22 (×2): 100 mg via ORAL
  Filled 2021-09-20 (×2): qty 1

## 2021-09-20 MED ORDER — BOOST / RESOURCE BREEZE PO LIQD CUSTOM
1.0000 | Freq: Three times a day (TID) | ORAL | Status: DC
  Administered 2021-09-20 – 2021-09-22 (×3): 1 via ORAL

## 2021-09-20 MED ORDER — CHLORHEXIDINE GLUCONATE CLOTH 2 % EX PADS: 6.0000 | MEDICATED_PAD | Freq: Every day | CUTANEOUS | Status: DC

## 2021-09-20 MED ORDER — FOLIC ACID 1 MG PO TABS
1.0000 mg | ORAL_TABLET | Freq: Every day | ORAL | Status: DC
  Administered 2021-09-21 – 2021-09-22 (×2): 1 mg via ORAL
  Filled 2021-09-20 (×2): qty 1

## 2021-09-20 MED ORDER — MUPIROCIN 2 % EX OINT
1.0000 | TOPICAL_OINTMENT | Freq: Two times a day (BID) | CUTANEOUS | Status: DC
  Administered 2021-09-20 – 2021-09-22 (×5): 1 via NASAL
  Filled 2021-09-20 (×2): qty 22

## 2021-09-20 MED ORDER — MAGNESIUM SULFATE 4 GM/100ML IV SOLN
4.0000 g | Freq: Once | INTRAVENOUS | Status: AC
  Administered 2021-09-20: 4 g via INTRAVENOUS
  Filled 2021-09-20: qty 100

## 2021-09-20 MED ORDER — ORAL CARE MOUTH RINSE
15.0000 mL | OROMUCOSAL | Status: DC | PRN
Start: 2021-09-20 — End: 2021-09-22

## 2021-09-20 NOTE — Progress Notes (Signed)
Initial Nutrition Assessment  DOCUMENTATION CODES:   Not applicable  INTERVENTION:   Boost Breeze po TID, each supplement provides 250 kcal and 9 grams of protein  MVI, thiamine and folic acid po daily   Pt at high refeed risk; recommend monitor potassium, magnesium and phosphorus labs daily until stable  NUTRITION DIAGNOSIS:   Inadequate oral intake related to acute illness as evidenced by per patient/family report.  GOAL:   Patient will meet greater than or equal to 90% of their needs  MONITOR:   PO intake, Supplement acceptance, Diet advancement, Labs, Weight trends, Skin, I & O's  REASON FOR ASSESSMENT:   Malnutrition Screening Tool    ASSESSMENT:   61 y.o. male with medical history significant of EtOH abuse, necrotizing pancreatitis, chronic pain syndrome, DM2, pancreatic insufficiency, two pseudocysts, CIDP, PE, HLD, gastroparesis and empyema s/p decortication (2019) and who is admitted with DKA and acute on chronic pancreatitis.  RD working remotely.  Unable to reach pt by phone. Per chart review, pt reports decreased oral intake for 5 days pta r/t abdominal pain. Pt remains on a clear liquid diet in hospital; there are no documented meal intakes in chart. RD will add supplements and MVI to help pt meet his estimated needs. Pt is at high refeed risk. Per chart, pt is down ~77lbs(27%) over the past year; this is severe weight loss. MD notified of weight loss. Recommend consideration of creon to help with pancreatic insufficiency. RD will obtain nutrition related history and exam at follow up. Pt is at high risk for malnutrition.   Medications reviewed and include: lovenox, MVI, LRS w/ 5% dextrose @175ml /hr, insulin  Labs reviewed: K 3.3(L), creat 0.57(L), Mg 1.5(L) Cbgs- 169, 125, 153, 226, 210, 160 x 24 hrs AIC 11.5(H)- 7/19  NUTRITION - FOCUSED PHYSICAL EXAM: Unable to perform at this time   Diet Order:   Diet Order             Diet clear liquid Room  service appropriate? Yes; Fluid consistency: Thin  Diet effective now                  EDUCATION NEEDS:   No education needs have been identified at this time  Skin:  Skin Assessment: Reviewed RN Assessment  Last BM:  pta  Height:   Ht Readings from Last 1 Encounters:  09/19/21 6\' 1"  (1.854 m)    Weight:   Wt Readings from Last 1 Encounters:  09/19/21 93.4 kg    Ideal Body Weight:  83.6 kg  BMI:  Body mass index is 27.17 kg/m.  Estimated Nutritional Needs:   Kcal:  2200-2500kcal/day  Protein:  110-125g/day  Fluid:  2.5-2.8L/day  MS, RD, LDN Please refer to [facility] for RD and/or RD on-call/weekend/after hours pager

## 2021-09-20 NOTE — Progress Notes (Signed)
  Transition of Care [facility]) Screening Note   Patient Details  Name: Bryce Mendoza Date of Birth: [birth year]   Transition of Care [facility]) CM/SW Contact:    Amada Jupiter, LCSW Phone Number: 09/20/2021, 11:01 AM    Transition of Care Department [facility]) has reviewed patient and no TOC needs have been identified at this time. We will continue to monitor patient advancement through interdisciplinary progression rounds. If new patient transition needs arise, please place a TOC consult.

## 2021-09-20 NOTE — Progress Notes (Signed)
PROGRESS NOTE  Bryce Mendoza SAY:[id number] DOB: October 22, 1960 DOA: 09/18/2021 PCP: Claiborne Rigg, NP  Brief History   The patient is a 61 yr old man who states that he began to have abdominal pain 5 days ago. The patient has chronic pancreatitis, and he states that this feels likel his pancreatitis. However, the patient is also an insulin dependent diabetic due to pancreatic insufficiency ,and has not been taking his insulin for more than a month.   In the ED the patient was found to be in DKA with pH of 7.21, CO2 of 12, and anion gap greater than 20. Lipase was elevated at 286. CT abdomen and pelvis demonstrated peripancreatic fat stranding about the body and tail. There are two cysts in the pancreas; one in the tail and the other in th body that is not significantly changed from the prior exam. These are felt to be sequela of previous necrotizing pancreatitis.  Lactic acid was 2.1.  The patient was admitted and placed on DKA protocol. His fluids have been changed to D5 LR at 175/hr in order to maintain his glucose while continuing his insulin drip.  He states that his abdominal pain is somewhat better today. He is interested in starting clear liquids.  Consultants  None  Procedures  None  Antibiotics   Anti-infectives (From admission, onward)    None      Subjective  The patient is resting comfortably. He is feeling better. He states that his abdominal pain is also somewhat better today. He is interested in starting clear liquids.  Objective   Vitals:  Vitals:   09/20/21 1100 09/20/21 1200  BP: 114/70   Pulse:    Resp: 11   Temp:  97.8 F (36.6 C)  SpO2:      Exam:  Constitutional:  The patient is awake, alert, and oriented x 3. No acute distress. Respiratory:  No increased work of breathing. No wheezes, rales, or rhonchi No tactile fremitus Cardiovascular:  Regular rate and rhythm No murmurs, ectopy, or gallups. No lateral PMI. No thrills. Abdomen:   Abdomen is soft, non-tender, non-distended No hernias, masses, or organomegaly Normoactive bowel sounds.  Musculoskeletal:  No cyanosis, clubbing, or edema Skin:  No rashes, lesions, ulcers palpation of skin: no induration or nodules Neurologic:  CN 2-12 intact Sensation all 4 extremities intact Psychiatric:  Mental status Mood, affect appropriate Orientation to person, place, time  judgment and insight appear intact  I have personally reviewed the following:   Today's Data  Vitals  Lab Data  CBC BMP Magnesium  Micro Data  MRSA screen negative  Imaging  CT abdomen and pelvis  Cardiology Data    Other Data    Scheduled Meds:  atorvastatin  20 mg Oral Daily   Chlorhexidine Gluconate Cloth  6 each Topical Q0600   enoxaparin (LOVENOX) injection  40 mg Subcutaneous Q24H   multivitamin with minerals  1 tablet Oral Daily   mupirocin ointment  1 Application Nasal BID   Continuous Infusions:  dextrose 5% lactated ringers 175 mL/hr at 09/20/21 1304   insulin 2 Units/hr (09/20/21 0600)   lactated ringers Stopped (09/19/21 0757)    Principal Problem:   DKA (diabetic ketoacidosis) (HCC) Active Problems:   Acute on chronic pancreatitis (HCC)   CIDP (chronic inflammatory demyelinating polyneuropathy) (HCC)   History of alcoholism (HCC)   History of pulmonary embolism   Chronic pain syndrome   LOS: 1 day   A & P  DKA (diabetic ketoacidosis) (HCC) Adult  onset IDDM, I suspect pancreatic insufficiency related to his history of necrotizing pancreatitis in past / chronic and recurrent pancreatitis. H/o DKA previously. Known h/o Pancreatic insufficiency (looks like hes on Creon with meals). DKA today is simply from not taking insulin for prolonged period of time (pt states ~1 month). DKA pathway Insulin gtt and IVF per pathway BMP Q4H 4 runs of IV K for K 3.2   Acute on chronic pancreatitis [facility]) Patient with acute pancreatitis today based on lipase and CT  findings.  Also chronic pancreatitis with 2 pseudocysts (1 stable, and 1 growing) NPO will advance to clears today. IVF - D5 LR at 175 Pain control: Trying toradol to see if any help Add narcotics if needed. Follow up with GI as outpatient regarding pseudocysts. These are likey due to his history of necrotizing pancreatitis   Chronic pain syndrome Had 2 rounds of IV dilaudid given by EDP for abd pain earlier. Doesn't appear to be on chronic narcotics based on med-rec. Ordering 1 time dose of toradol. Does have acute pancreatitis on CT today, may need something stronger. Will put PRN IV dilaudid in.   History of pulmonary embolism DVT/PE in 2019. No longer on eliquis per med-rec. Pt not sure if doc took him off of this or if he just didn't get it refilled. Will hold off on restarting this for the moment.   History of alcoholism (HCC) Patient states not drinking any more. Im suspicious this was the cause of his prior necrotizing pancreatitis in the past.   CIDP (chronic inflammatory demyelinating polyneuropathy) (HCC) Doesn't appear to be on the pamelor anymore based on med-rec. Not clear if he got taken off the pamelor by physician, or just didn't follow up / get refill. Possible that neurology just felt he didn't actually have CIDP? I see a note from [facility] in May 2022 where he had normal EMG testing findings.  I have seen and examined this patient myself. I have spent 34 minutes in her evaluation and care.  DVT prophylaxis: Lovenox Code Status: Full Code Family Communication: None available Disposition Plan: Home    Hatem Cull, DO [facility] Direct contact: see [email or web address]  7PM-7AM contact night coverage as above 09/20/2021, 1:59 PM  LOS: 1 day

## 2021-09-21 LAB — GLUCOSE, CAPILLARY
Glucose-Capillary: 155 mg/dL — ABNORMAL HIGH (ref 70–99)
Glucose-Capillary: 139 mg/dL — ABNORMAL HIGH (ref 70–99)
Glucose-Capillary: 152 mg/dL — ABNORMAL HIGH (ref 70–99)
Glucose-Capillary: 155 mg/dL — ABNORMAL HIGH (ref 70–99)
Glucose-Capillary: 163 mg/dL — ABNORMAL HIGH (ref 70–99)
Glucose-Capillary: 140 mg/dL — ABNORMAL HIGH (ref 70–99)
Glucose-Capillary: 119 mg/dL — ABNORMAL HIGH (ref 70–99)
Glucose-Capillary: 139 mg/dL — ABNORMAL HIGH (ref 70–99)
Glucose-Capillary: 217 mg/dL — ABNORMAL HIGH (ref 70–99)
Glucose-Capillary: 192 mg/dL — ABNORMAL HIGH (ref 70–99)

## 2021-09-21 LAB — BASIC METABOLIC PANEL
Anion gap: 4 — ABNORMAL LOW (ref 5–15)
Anion gap: 6 (ref 5–15)
Anion gap: 7 (ref 5–15)
Anion gap: 6 (ref 5–15)
BUN: 8 mg/dL (ref 6–20)
BUN: 7 mg/dL (ref 6–20)
BUN: 7 mg/dL (ref 6–20)
BUN: 7 mg/dL (ref 6–20)
CO2: 18 mmol/L — ABNORMAL LOW (ref 22–32)
CO2: 17 mmol/L — ABNORMAL LOW (ref 22–32)
CO2: 15 mmol/L — ABNORMAL LOW (ref 22–32)
CO2: 15 mmol/L — ABNORMAL LOW (ref 22–32)
Calcium: 8.4 mg/dL — ABNORMAL LOW (ref 8.9–10.3)
Calcium: 8.3 mg/dL — ABNORMAL LOW (ref 8.9–10.3)
Calcium: 8.4 mg/dL — ABNORMAL LOW (ref 8.9–10.3)
Calcium: 8.5 mg/dL — ABNORMAL LOW (ref 8.9–10.3)
Chloride: 117 mmol/L — ABNORMAL HIGH (ref 98–111)
Chloride: 115 mmol/L — ABNORMAL HIGH (ref 98–111)
Chloride: 117 mmol/L — ABNORMAL HIGH (ref 98–111)
Chloride: 116 mmol/L — ABNORMAL HIGH (ref 98–111)
Creatinine, Ser: 0.63 mg/dL (ref 0.61–1.24)
Creatinine, Ser: 0.59 mg/dL — ABNORMAL LOW (ref 0.61–1.24)
Creatinine, Ser: 0.57 mg/dL — ABNORMAL LOW (ref 0.61–1.24)
Creatinine, Ser: 0.63 mg/dL (ref 0.61–1.24)
GFR, Estimated: >60 mL/min (ref >60)
GFR, Estimated: >60 mL/min (ref >60)
GFR, Estimated: >60 mL/min (ref >60)
GFR, Estimated: >60 mL/min (ref >60)
Glucose, Bld: 149 mg/dL — ABNORMAL HIGH (ref 70–99)
Glucose, Bld: 135 mg/dL — ABNORMAL HIGH (ref 70–99)
Glucose, Bld: 128 mg/dL — ABNORMAL HIGH (ref 70–99)
Glucose, Bld: 227 mg/dL — ABNORMAL HIGH (ref 70–99)
Potassium: 3.6 mmol/L (ref 3.5–5.1)
Potassium: 3.4 mmol/L — ABNORMAL LOW (ref 3.5–5.1)
Potassium: 4.2 mmol/L (ref 3.5–5.1)
Potassium: 4.7 mmol/L (ref 3.5–5.1)
Sodium: 139 mmol/L (ref 135–145)
Sodium: 138 mmol/L (ref 135–145)
Sodium: 139 mmol/L (ref 135–145)
Sodium: 137 mmol/L (ref 135–145)

## 2021-09-21 LAB — PHOSPHORUS: Phosphorus: 2.5 mg/dL (ref 2.5–4.6)

## 2021-09-21 LAB — MAGNESIUM: Magnesium: 2.2 mg/dL (ref 1.7–2.4)

## 2021-09-21 MED ORDER — INSULIN GLARGINE-YFGN 100 UNIT/ML ~~LOC~~ SOLN
12.0000 [IU] | Freq: Every day | SUBCUTANEOUS | Status: DC
  Administered 2021-09-21 – 2021-09-22 (×2): 12 [IU] via SUBCUTANEOUS
  Filled 2021-09-21 (×2): qty 0.12

## 2021-09-21 MED ORDER — INSULIN ASPART 100 UNIT/ML IJ SOLN: 0.0000 [IU] | Freq: Every day | INTRAMUSCULAR | Status: DC

## 2021-09-21 MED ORDER — INSULIN ASPART 100 UNIT/ML IJ SOLN
0.0000 [IU] | Freq: Three times a day (TID) | INTRAMUSCULAR | Status: DC
  Administered 2021-09-21: 3 [IU] via SUBCUTANEOUS
  Administered 2021-09-21: 1 [IU] via SUBCUTANEOUS
  Administered 2021-09-22: 3 [IU] via SUBCUTANEOUS
  Administered 2021-09-22: 2 [IU] via SUBCUTANEOUS

## 2021-09-21 MED ORDER — DOCUSATE SODIUM 100 MG PO CAPS
200.0000 mg | ORAL_CAPSULE | Freq: Once | ORAL | Status: AC
  Administered 2021-09-21: 200 mg via ORAL
  Filled 2021-09-21: qty 2

## 2021-09-21 NOTE — Progress Notes (Signed)
PROGRESS NOTE  Bryce Mendoza HER:[id number] DOB: [birth year] DOA: 09/18/2021 PCP: Claiborne Rigg, NP  Brief History   The patient is a 61 yr old man who states that he began to have abdominal pain 5 days ago. The patient has chronic pancreatitis, and he states that this feels likel his pancreatitis. However, the patient is also an insulin dependent diabetic due to pancreatic insufficiency ,and has not been taking his insulin for more than a month.   In the ED the patient was found to be in DKA with pH of 7.21, CO2 of 12, and anion gap greater than 20. Lipase was elevated at 286. CT abdomen and pelvis demonstrated peripancreatic fat stranding about the body and tail. There are two cysts in the pancreas; one in the tail and the other in th body that is not significantly changed from the prior exam. These are felt to be sequela of previous necrotizing pancreatitis.  Lactic acid was 2.1.  The patient was admitted and placed on DKA protocol. His fluids have been changed to D5 LR at 175/hr in order to maintain his glucose while continuing his insulin drip. This morning the patient has closed his gap. He will be transitioned from the insulin drip to sub Q.  The patient states that he is feeling a lot better this morning. He wants to advance to a full liquid diet.   Consultants  None  Procedures  None  Antibiotics   Anti-infectives (From admission, onward)    None      Subjective  The patient is resting comfortably. No new complaints. He wants to try a full liquid diet.  Objective   Vitals:  Vitals:   09/21/21 1100 09/21/21 1200  BP: (!) 150/86 95/66  Pulse: 68 (!) 57  Resp: 15 11  Temp:  98 F (36.7 C)  SpO2: 97% 91%    Exam:  Constitutional:  The patient is awake, alert, and oriented x 3. No acute distress. Respiratory:  No increased work of breathing. No wheezes, rales, or rhonchi No tactile fremitus Cardiovascular:  Regular rate and rhythm No murmurs, ectopy, or  gallups. No lateral PMI. No thrills. Abdomen:  Abdomen is soft, non-tender, non-distended No hernias, masses, or organomegaly Normoactive bowel sounds.  Musculoskeletal:  No cyanosis, clubbing, or edema Skin:  No rashes, lesions, ulcers palpation of skin: no induration or nodules Neurologic:  CN 2-12 intact Sensation all 4 extremities intact Psychiatric:  Mental status Mood, affect appropriate Orientation to person, place, time  judgment and insight appear intact  I have personally reviewed the following:   Today's Data  Vitals  Lab Data   BMP Magnesium  Micro Data  MRSA screen negative  Imaging  CT abdomen and pelvis  Cardiology Data    Other Data    Scheduled Meds:  atorvastatin  20 mg Oral Daily   Chlorhexidine Gluconate Cloth  6 each Topical Q0600   enoxaparin (LOVENOX) injection  40 mg Subcutaneous Q24H   feeding supplement  1 Container Oral TID BM   folic acid  1 mg Oral Daily   insulin aspart  0-5 Units Subcutaneous QHS   insulin aspart  0-9 Units Subcutaneous TID WC   insulin glargine-yfgn  12 Units Subcutaneous Daily   multivitamin with minerals  1 tablet Oral Daily   mupirocin ointment  1 Application Nasal BID   thiamine  100 mg Oral Daily   Continuous Infusions:  dextrose 5% lactated ringers Stopped (09/21/21 1110)   insulin Stopped (09/21/21 1110)  Principal Problem:   DKA (diabetic ketoacidosis) (HCC) Active Problems:   Acute on chronic pancreatitis (HCC)   CIDP (chronic inflammatory demyelinating polyneuropathy) (HCC)   History of alcoholism (HCC)   History of pulmonary embolism   Chronic pain syndrome   LOS: 2 days   A & P  DKA (diabetic ketoacidosis) (HCC) Resolved. Will transition to subcutaneous insulin from drip.    Acute on chronic pancreatitis [facility]) Patient with acute pancreatitis today based on lipase and CT findings.  Also chronic pancreatitis with 2 pseudocysts (1 stable, and 1 growing). The patient was transitioned  to a clear liquid diet on 09/20/2021. He has tolerated that well and states that he does not need IV narcotics today. Will advance him to a full liquid diet.   Chronic pain syndrome Noted. Avoid IV narcotics as possible.   History of pulmonary embolism DVT/PE in 2019. No longer on eliquis per med-rec. Pt not sure if doc took him off of this or if he just didn't get it refilled. Will defer to PCP.   History of alcoholism (HCC) Patient states not drinking any more. Im suspicious this was the cause of his prior necrotizing pancreatitis in the past.   CIDP (chronic inflammatory demyelinating polyneuropathy) (HCC) Doesn't appear to be on the pamelor anymore based on med-rec. Not clear if he got taken off the pamelor by physician, or just didn't follow up / get refill. Possible that neurology just felt he didn't actually have CIDP? I see a note from [facility] in May 2022 where he had normal EMG testing findings.  I have seen and examined this patient myself. I have spent 32 minutes in her evaluation and care.  DVT prophylaxis: Lovenox Code Status: Full Code Family Communication: None available Disposition Plan: Home    Jamis Kryder, DO [facility] Direct contact: see [email or web address]  7PM-7AM contact night coverage as above 09/21/2021, 3:23 PM  LOS: 1 day

## 2021-09-21 NOTE — Inpatient Diabetes Management (Signed)
Inpatient Diabetes Program Recommendations  AACE/ADA: New Consensus Statement on Inpatient Glycemic Control (2015)  Target Ranges:  Prepandial:   less than 140 mg/dL      Peak postprandial:   less than 180 mg/dL (1-2 hours)      Critically ill patients:  140 - 180 mg/dL   Lab Results  Component Value Date   GLUCAP 140 (H) 09/21/2021   HGBA1C 11.5 (H) 09/18/2021    Review of Glycemic Control  Diabetes history: DM2 Outpatient Diabetes medications: Lantus 18 QD and 8 units QHS, glipizide10 QAM, metformin 500 mg BID (no meds for > 1 month) Current orders for Inpatient glycemic control: IV insulin  HgbA1C - 11.5% CO2 - 18 Beta-hydroxybutyric acid - 0.48 (07/21) On CL diet  Inpatient Diabetes Program Recommendations:    Needs beta-hydroxybutyric acid results this am When ready to transition off IV insulin,  give Semglee 15 units QD (2H prior to discontinuation of drip) Novolog 0-9 units Q4 x 12H, then TID with meals and 0-5 HS  Will need prescription for Lantus Solostar pen at discharge. Needs f/u with PCP within a week or two after discharge.  Thank you. Ailene Ards, RD, LDN, CDE Inpatient Diabetes Coordinator [phone]

## 2021-09-22 LAB — BASIC METABOLIC PANEL
Anion gap: 5 (ref 5–15)
BUN: 7 mg/dL (ref 6–20)
CO2: 18 mmol/L — ABNORMAL LOW (ref 22–32)
Calcium: 8.1 mg/dL — ABNORMAL LOW (ref 8.9–10.3)
Chloride: 114 mmol/L — ABNORMAL HIGH (ref 98–111)
Creatinine, Ser: 0.64 mg/dL (ref 0.61–1.24)
GFR, Estimated: >60 mL/min (ref >60)
Glucose, Bld: 244 mg/dL — ABNORMAL HIGH (ref 70–99)
Potassium: 4.1 mmol/L (ref 3.5–5.1)
Sodium: 137 mmol/L (ref 135–145)

## 2021-09-22 LAB — CBC WITH DIFFERENTIAL/PLATELET
Abs Immature Granulocytes: 0.01 10*3/uL (ref 0.00–0.07)
Basophils Absolute: 0 10*3/uL (ref 0.0–0.1)
Basophils Relative: 1 %
Eosinophils Absolute: 0.1 10*3/uL (ref 0.0–0.5)
Eosinophils Relative: 2 %
HCT: 38.1 % — ABNORMAL LOW (ref 39.0–52.0)
Hemoglobin: 13 g/dL (ref 13.0–17.0)
Immature Granulocytes: 0 %
Lymphocytes Relative: 46 %
Lymphs Abs: 2.6 10*3/uL (ref 0.7–4.0)
MCH: 36.3 pg — ABNORMAL HIGH (ref 26.0–34.0)
MCHC: 34.1 g/dL (ref 30.0–36.0)
MCV: 106.4 fL — ABNORMAL HIGH (ref 80.0–100.0)
Monocytes Absolute: 0.5 10*3/uL (ref 0.1–1.0)
Monocytes Relative: 9 %
Neutro Abs: 2.4 10*3/uL (ref 1.7–7.7)
Neutrophils Relative %: 42 %
Platelets: 113 10*3/uL — ABNORMAL LOW (ref 150–400)
RBC: 3.58 MIL/uL — ABNORMAL LOW (ref 4.22–5.81)
RDW: 14.6 % (ref 11.5–15.5)
WBC: 5.6 10*3/uL (ref 4.0–10.5)
nRBC: 0 % (ref 0.0–0.2)

## 2021-09-22 LAB — PHOSPHORUS: Phosphorus: 3.7 mg/dL (ref 2.5–4.6)

## 2021-09-22 LAB — GLUCOSE, CAPILLARY
Glucose-Capillary: 222 mg/dL — ABNORMAL HIGH (ref 70–99)
Glucose-Capillary: 170 mg/dL — ABNORMAL HIGH (ref 70–99)

## 2021-09-22 LAB — MAGNESIUM: Magnesium: 1.9 mg/dL (ref 1.7–2.4)

## 2021-09-22 MED ORDER — DIPHENHYDRAMINE HCL 25 MG PO CAPS
25.0000 mg | ORAL_CAPSULE | Freq: Every evening | ORAL | Status: DC | PRN
Start: 2021-09-21 — End: 2021-09-22
  Administered 2021-09-22: 25 mg via ORAL
  Filled 2021-09-22: qty 1

## 2021-09-22 MED ORDER — MELATONIN 3 MG PO TABS
3.0000 mg | ORAL_TABLET | Freq: Every evening | ORAL | Status: DC | PRN
Start: 2021-09-22 — End: 2021-09-22

## 2021-09-22 MED ORDER — INSULIN GLARGINE 100 UNIT/ML SOLOSTAR PEN
18.0000 [IU] | PEN_INJECTOR | Freq: Every day | SUBCUTANEOUS | 0 refills | Status: DC
  Filled 2021-09-22: qty 15, 83d supply, fill #0

## 2021-09-22 MED ORDER — FOLIC ACID 1 MG PO TABS: 1.0000 mg | ORAL_TABLET | Freq: Every day | ORAL | 0 refills | Status: DC

## 2021-09-22 MED ORDER — INSULIN GLARGINE-YFGN 100 UNIT/ML ~~LOC~~ SOLN
18.0000 [IU] | Freq: Every day | SUBCUTANEOUS | 0 refills | Status: DC
  Filled 2021-09-22: qty 10, 55d supply, fill #0

## 2021-09-22 MED ORDER — FOLIC ACID 1 MG PO TABS
1.0000 mg | ORAL_TABLET | Freq: Every day | ORAL | 0 refills | Status: DC
  Filled 2021-09-22: qty 30, 30d supply, fill #0

## 2021-09-22 MED ORDER — THIAMINE HCL 100 MG PO TABS: 100.0000 mg | ORAL_TABLET | Freq: Every day | ORAL | 0 refills | Status: DC

## 2021-09-22 MED ORDER — THIAMINE HCL 100 MG PO TABS
100.0000 mg | ORAL_TABLET | Freq: Every day | ORAL | 0 refills | Status: DC
  Filled 2021-09-22: qty 30, 30d supply, fill #0

## 2021-09-22 MED ORDER — INSULIN GLARGINE 100 UNIT/ML SOLOSTAR PEN: 18.0000 [IU] | PEN_INJECTOR | Freq: Every day | SUBCUTANEOUS | 0 refills | Status: DC

## 2021-09-22 NOTE — Discharge Summary (Signed)
Physician Discharge Summary   Patient: Bryce Mendoza MRN: [id number] DOB: 11/02/1960  Admit date:     09/18/2021  Discharge date: 09/22/21  Discharge Physician: Karie Kirks   PCP: [name], NP   Recommendations at discharge:    Discharge to home Follow up with PCP in 7-10 days. Check glucoses twice daily, record values, and take this record in to visit with PCP. Pt should have Chemistry drawn on this visit to be reported to PCP. Follow up with gastroenterology in 2-4 weeks.  Discharge Diagnoses: Principal Problem:   DKA (diabetic ketoacidosis) ([place]) Active Problems:   Acute on chronic pancreatitis (HCC)   CIDP (chronic inflammatory demyelinating polyneuropathy) (HCC)   History of alcoholism ([place])   History of pulmonary embolism   Chronic pain syndrome  Resolved Problems:   * No resolved hospital problems. [facility] Course: The patient is a 61 yr old man who states that he began to have abdominal pain 5 days ago. The patient has chronic pancreatitis, and he states that this feels likel his pancreatitis. However, the patient is also an insulin dependent diabetic due to pancreatic insufficiency ,and has not been taking his insulin for more than a month.    In the ED the patient was found to be in DKA with pH of 7.21, CO2 of 12, and anion gap greater than 20. Lipase was elevated at 286. CT abdomen and pelvis demonstrated peripancreatic fat stranding about the body and tail. There are two cysts in the pancreas; one in the tail and the other in th body that is not significantly changed from the prior exam. These are felt to be sequela of previous necrotizing pancreatitis.   Lactic acid was 2.1.   The patient was admitted and placed on DKA protocol. His fluids have been changed to D5 LR at 175/hr in order to maintain his glucose while continuing his insulin drip. This morning the patient has closed his gap. He will be transitioned from the insulin drip to sub Q.   The patient  had been advanced from a full liquid to a regular diabetic diet on 09/21/2021. He is tolerating that well. He will be discharged to home in fair condition.  Assessment and Plan: * DKA (diabetic ketoacidosis) ([place]) Adult onset IDDM, I suspect pancreatic insufficiency related to his history of necrotizing pancreatitis in past / chronic and recurrent pancreatitis. H/o DKA previously. Known h/o Pancreatic insufficiency (looks like hes on Creon with meals). DKA today is simply from not taking insulin for prolonged period of time (pt states ~1 month).  Resolved. The patient was discharged on Lantus and exhorted to seek refills of his medications before he is totally out. He was told that his insulin is absolutely critical for him.  Acute on chronic pancreatitis [facility]) Patient with acute pancreatitis today based on lipase and CT findings.  Also chronic pancreatitis with 2 pseudocysts (1 stable, and 1 growing). The patient was initially made NPO, then his diet advanced as tolerated.  Probably needs GI follow up at a minimum at this point regarding those pseudocysts (1 unresolving, 1 growing). The patient is to continue Creon as prior to admission.  Chronic pain syndrome Noted.  History of pulmonary embolism DVT/PE in 2019. No longer on eliquis per med-rec. Pt not sure if doc took him off of this or if he just didn't get it refilled. Return to Eliquis is deferred to PCP.  History of alcoholism ([place]) Patient states not drinking any more. I'm suspicious this was  the cause of his prior necrotizing pancreatitis in the past.  CIDP (chronic inflammatory demyelinating polyneuropathy) (HCC) Doesn't appear to be on the pamelor anymore based on med-rec. Not clear if he got taken off the pamelor by physician, or just didn't follow up / get refill. Possible that neurology just felt he didn't actually have CIDP? I see a note from [facility] in May 2022 where he had normal EMG testing findings. Will hold  off on re-ordering for the moment.   I have seen and examined this patient myself. I have spent 34 minutes in his evaluation and care.  Consultants: None Procedures performed: None  Disposition: Home Diet recommendation:  Discharge Diet Orders (From admission, onward)     Start     Ordered   09/22/21 0000  Diet Carb Modified        09/22/21 1345           Carb modified diet DISCHARGE MEDICATION: Allergies as of 09/22/2021       Reactions   Morphine Other (See Comments)   Severe nausea        Medication List     STOP taking these medications    Eliquis 5 MG Tabs tablet Generic drug: apixaban   glipiZIDE 10 MG tablet Commonly known as: GLUCOTROL   losartan 25 MG tablet Commonly known as: COZAAR   metoCLOPramide 5 MG tablet Commonly known as: Reglan       TAKE these medications    Accu-Chek Guide Me w/Device Kit 1 each by Does not apply route 2 (two) times daily.   Accu-Chek Guide test strip Generic drug: glucose blood Check blood sugar once daily.   Accu-Chek Softclix Lancets lancets Use as instructed   atorvastatin 20 MG tablet Commonly known as: LIPITOR Take 3 tablets (60 mg total) by mouth daily. What changed: how much to take   blood glucose meter kit and supplies Kit Dispense based on patient and insurance preference. Use up to four times daily as directed.   dicyclomine 10 MG capsule Commonly known as: BENTYL Take 1 capsule (10 mg total) by mouth 3 (three) times daily before meals. What changed:  when to take this reasons to take this   folic acid 1 MG tablet Commonly known as: FOLVITE Take 1 tablet (1 mg total) by mouth daily. Start taking on: September 23, 2021   FreeStyle Libre 14 Day Reader Kerrin Mo Use as instructed. Check blood glucose level by fingerstick 3times per day. E11.65 G61.81 G60.9   FreeStyle Libre 14 Day Sensor Misc Use as instructed. Check blood glucose level by fingerstick 3times per day. E11.65 G61.81 G60.9    insulin glargine 100 UNIT/ML Solostar Pen Commonly known as: LANTUS Inject 18 Units into the skin daily.   lipase/protease/amylase 12000-38000 units Cpep capsule Commonly known as: CREON Take 2 capsules (24,000 Units total) by mouth 3 (three) times daily before meals.   metFORMIN 500 MG 24 hr tablet Commonly known as: GLUCOPHAGE-XR Take 1 tablet (500 mg total) by mouth in the morning and at bedtime.   multivitamin tablet Take 1 tablet by mouth daily.   omeprazole 40 MG capsule Commonly known as: PRILOSEC Take 40 mg by mouth daily as needed (for acid reflux).   PEN NEEDLES 31GX5/16" 31G X 8 MM Misc 1 each by Does not apply route daily.   thiamine 100 MG tablet Take 1 tablet (100 mg total) by mouth daily. Start taking on: September 23, 2021   Vitamin D (Ergocalciferol) 1.25 MG (50000 UNIT)  Caps capsule Commonly known as: DRISDOL Take 50,000 Units by mouth once a week. Tuesday        Discharge Exam: Filed Weights   09/18/21 2003 09/19/21 0900  Weight: 106.6 kg 93.4 kg   Vitals:   09/22/21 0445 09/22/21 1330  BP: (!) 139/94 126/85  Pulse: (!) 57 62  Resp: 18 19  Temp: 98 F (36.7 C) 98.3 F (36.8 C)  SpO2: 98% 100%   Exam:  Constitutional:  The patient is awake, alert, and oriented x 3. No acute distress. Respiratory:  No increased work of breathing. No wheezes, rales, or rhonchi No tactile fremitus Cardiovascular:  Regular rate and rhythm No murmurs, ectopy, or gallups. No lateral PMI. No thrills. Abdomen:  Abdomen is soft, non-tender, non-distended No hernias, masses, or organomegaly Normoactive bowel sounds.  Musculoskeletal:  No cyanosis, clubbing, or edema Skin:  No rashes, lesions, ulcers palpation of skin: no induration or nodules Neurologic:  CN 2-12 intact Sensation all 4 extremities intact Psychiatric:  Mental status Mood, affect appropriate Orientation to person, place, time  judgment and insight appear intact   Condition at  discharge: fair  The results of significant diagnostics from this hospitalization (including imaging, microbiology, ancillary and laboratory) are listed below for reference.   Imaging Studies: CT ABDOMEN PELVIS W CONTRAST  Result Date: 09/19/2021 CLINICAL DATA:  Left upper quadrant abdominal pain, history of pancreatitis. EXAM: CT ABDOMEN AND PELVIS WITH CONTRAST TECHNIQUE: Multidetector CT imaging of the abdomen and pelvis was performed using the standard protocol following bolus administration of intravenous contrast. RADIATION DOSE REDUCTION: This exam was performed according to the departmental dose-optimization program which includes automated exposure control, adjustment of the mA and/or kV according to patient size and/or use of iterative reconstruction technique. CONTRAST:  162m OMNIPAQUE IOHEXOL 300 MG/ML  SOLN COMPARISON:  03/20/2021. FINDINGS: Lower chest: Strandy atelectasis or scarring is present at the left lung base. Hepatobiliary: No focal liver abnormality is seen. Fatty infiltration is noted. No gallstones, gallbladder wall thickening, or biliary dilatation. Pancreas: There is peripancreatic fat stranding about the body and tail. A cyst is present in the head of the pancreas measuring 3.2 cm, increased in size from the prior exam. A stable complex fluid collection is noted in the pancreatic body and tail measuring, not significantly changed from the prior exam, and likely sequela previous necrotizing pancreatitis. Spleen: Normal in size without focal abnormality. Adrenals/Urinary Tract: No adrenal nodule or mass. The kidneys enhance symmetrically. No renal calculus or hydronephrosis. The bladder is unremarkable. Stomach/Bowel: Stomach is within normal limits. Appendix appears normal. No evidence of bowel wall thickening, distention, or inflammatory changes. No free air or pneumatosis. Vascular/Lymphatic: Aortic atherosclerosis. No enlarged abdominal or pelvic lymph nodes. Reproductive:  Prostate is unremarkable. Other: Fat containing inguinal hernias are noted bilaterally. There is a fat containing umbilical hernia. No abdominopelvic ascites. Musculoskeletal: Degenerative changes are present in the thoracolumbar spine. No acute osseous abnormality. IMPRESSION: 1. Peripancreatic fat stranding, suggesting acute pancreatitis. A complex hypodense collection in the body and tail of the pancreas is stable. There is a cyst in the head of the pancreas now measuring 3.2 cm, increased in size from the prior exam. 2. Hepatic steatosis. 3. Aortic atherosclerosis. Electronically Signed   By: LBrett FairyM.D.   On: 09/19/2021 03:28    Microbiology: Results for orders placed or performed during the hospital encounter of 09/18/21  MRSA Next Gen by PCR, Nasal     Status: Abnormal   Collection Time: 09/19/21  8:59  AM   Specimen: Nasal Mucosa; Nasal Swab  Result Value Ref Range Status   MRSA by PCR Next Gen DETECTED (A) NOT DETECTED Final    Comment: (NOTE) The GeneXpert MRSA Assay (FDA approved for NASAL specimens only), is one component of a comprehensive MRSA colonization surveillance program. It is not intended to diagnose MRSA infection nor to guide or monitor treatment for MRSA infections. Test performance is not FDA approved in patients less than 46 years old. Performed at [facility], [address] [place], [place] [address]     Labs: CBC: Recent Labs  Lab 09/18/21 2025 09/22/21 0549  WBC 9.7 5.6  NEUTROABS 5.9 2.4  HGB 16.0 13.0  HCT 44.9 38.1*  MCV 102.7* 106.4*  PLT 159 964*   Basic Metabolic Panel: Recent Labs  Lab 09/19/21 1310 09/19/21 1723 09/20/21 0548 09/20/21 1427 09/21/21 0153 09/21/21 0719 09/21/21 1051 09/21/21 1447 09/22/21 0549  NA 136   < >  --    < > 139 138 139 137 137  K 3.4*   < >  --    < > 3.6 3.4* 4.2 4.7 4.1  CL 109   < >  --    < > 117* 115* 117* 116* 114*  CO2 16*   < >  --    < > 18* 17* 15* 15* 18*  GLUCOSE  104*   < >  --    < > 149* 135* 128* 227* 244*  BUN 10   < >  --    < > '8 7 7 7 7  ' CREATININE 0.74   < >  --    < > 0.63 0.59* 0.57* 0.63 0.64  CALCIUM 8.9   < >  --    < > 8.4* 8.3* 8.4* 8.5* 8.1*  MG 1.6*  --  1.5*  --  2.2  --   --   --  1.9  PHOS  --   --   --   --  2.5  --   --   --  3.7   < > = values in this interval not displayed.   Liver Function Tests: Recent Labs  Lab 09/18/21 0054  AST 21  ALT 18  ALKPHOS 73  BILITOT 1.6*  PROT 7.7  ALBUMIN 4.2   CBG: Recent Labs  Lab 09/21/21 1109 09/21/21 1642 09/21/21 2059 09/22/21 0806 09/22/21 1131  GLUCAP 139* 217* 192* 222* 170*    Discharge time spent: greater than 30 minutes.  Signed: Adama Ivins, DO [facility] 09/22/2021

## 2021-09-22 NOTE — Plan of Care (Signed)

## 2021-09-22 NOTE — Progress Notes (Signed)
Order to discharge pt home.  Discharge instructions/AVS given to patient and reviewed - education provided as needed.  Pt advised to call PCP and/or come back to the hospital if there are any problems. Pt verbalized understanding.    

## 2021-09-23 ENCOUNTER — Other Ambulatory Visit (HOSPITAL_COMMUNITY): Payer: Self-pay

## 2021-11-15 ENCOUNTER — Ambulatory Visit: Payer: Self-pay

## 2021-11-15 NOTE — Telephone Encounter (Signed)
    Chief Complaint: Pt. Reports having abdominal pain from pancreatitis.Seeing his pain doctor Monday. Asking for a "small amount of pain medicine until I see him."  Symptoms: Pain, diarrhea Frequency: Last night Pertinent Negatives: Patient denies  Disposition: [] ED /[] Urgent Care (no appt availability in office) / [] Appointment(In office/virtual)/ []  [place] [facility]/ [] Home Care/ [] Refused Recommended Disposition /[] [place] [facility]/ [x]  Follow-up with PCP Additional Notes: Please advise pt.  Answer Assessment - Initial Assessment Questions 1. LOCATION: "Where does it hurt?"      Midline, below sternum 2. RADIATION: "Does the pain shoot anywhere else?" (e.g., chest, back)     No 3. ONSET: "When did the pain begin?" (Minutes, hours or days ago)      Last night 4. SUDDEN: "Gradual or sudden onset?"     Gradual 5. PATTERN "Does the pain come and go, or is it constant?"    - If it comes and goes: "How long does it last?" "Do you have pain now?"     (Note: Comes and goes means the pain is intermittent. It goes away completely between bouts.)    - If constant: "Is it getting better, staying the same, or getting worse?"      (Note: Constant means the pain never goes away completely; most serious pain is constant and gets worse.)      Constant 6. SEVERITY: "How bad is the pain?"  (e.g., Scale 1-10; mild, moderate, or severe)    - MILD (1-3): Doesn't interfere with normal activities, abdomen soft and not tender to touch.     - MODERATE (4-7): Interferes with normal activities or awakens from sleep, abdomen tender to touch.     - SEVERE (8-10): Excruciating pain, doubled over, unable to do any normal activities.       9-10 7. RECURRENT SYMPTOM: "Have you ever had this type of stomach pain before?" If Yes, ask: "When was the last time?" and "What happened that time?"      Yes 8. CAUSE: "What do you think is causing the stomach pain?"     Pancreas 9. RELIEVING/AGGRAVATING  FACTORS: "What makes it better or worse?" (e.g., antacids, bending or twisting motion, bowel movement)     Pain medication 10. OTHER SYMPTOMS: "Do you have any other symptoms?" (e.g., back pain, diarrhea, fever, urination pain, vomiting)       Diarrhea  Protocols used: Abdominal Pain - Male-A-AH

## 2021-11-18 DIAGNOSIS — Z8719 Personal history of other diseases of the digestive system: Secondary | ICD-10-CM | POA: Diagnosis not present

## 2021-11-18 DIAGNOSIS — Z6830 Body mass index (BMI) 30.0-30.9, adult: Secondary | ICD-10-CM | POA: Diagnosis not present

## 2021-11-18 DIAGNOSIS — Z0130 Encounter for examination of blood pressure without abnormal findings: Secondary | ICD-10-CM | POA: Diagnosis not present

## 2021-11-18 DIAGNOSIS — R030 Elevated blood-pressure reading, without diagnosis of hypertension: Secondary | ICD-10-CM | POA: Diagnosis not present

## 2021-11-18 DIAGNOSIS — Z79899 Other long term (current) drug therapy: Secondary | ICD-10-CM | POA: Diagnosis not present

## 2021-11-18 DIAGNOSIS — R5383 Other fatigue: Secondary | ICD-10-CM | POA: Diagnosis not present

## 2021-11-18 DIAGNOSIS — E1165 Type 2 diabetes mellitus with hyperglycemia: Secondary | ICD-10-CM | POA: Diagnosis not present

## 2021-11-18 NOTE — Telephone Encounter (Signed)
Routing to CMA 

## 2021-11-18 NOTE — Telephone Encounter (Signed)
Unable to reach pt by phone.

## 2021-11-24 DIAGNOSIS — Z87891 Personal history of nicotine dependence: Secondary | ICD-10-CM | POA: Diagnosis not present

## 2021-11-24 DIAGNOSIS — Z760 Encounter for issue of repeat prescription: Secondary | ICD-10-CM | POA: Diagnosis not present

## 2021-11-24 DIAGNOSIS — Z0130 Encounter for examination of blood pressure without abnormal findings: Secondary | ICD-10-CM | POA: Diagnosis not present

## 2021-11-24 DIAGNOSIS — Z789 Other specified health status: Secondary | ICD-10-CM | POA: Diagnosis not present

## 2021-11-24 DIAGNOSIS — E119 Type 2 diabetes mellitus without complications: Secondary | ICD-10-CM | POA: Diagnosis not present

## 2021-11-24 DIAGNOSIS — Z6830 Body mass index (BMI) 30.0-30.9, adult: Secondary | ICD-10-CM | POA: Diagnosis not present

## 2021-12-04 ENCOUNTER — Other Ambulatory Visit: Payer: Self-pay | Admitting: Nurse Practitioner

## 2021-12-04 MED ORDER — INSULIN GLARGINE 100 UNIT/ML SOLOSTAR PEN: 18.0000 [IU] | PEN_INJECTOR | Freq: Every day | SUBCUTANEOUS | 0 refills | Status: DC

## 2021-12-04 NOTE — Telephone Encounter (Signed)
Medication Refill - Medication: insulin glargine (LANTUS) 100 UNIT/ML Solostar Pen  Has the patient contacted their pharmacy? Yes.   But [facility] from [facility].  Preferred Pharmacy (with phone number or street name): [id number] [place], [place]  Has the patient been seen for an appointment in the last year OR does the patient have an upcoming appointment? Yes.    Agent: Please be advised that RX refills may take up to 3 business days. We ask that you follow-up with your pharmacy.

## 2021-12-04 NOTE — Telephone Encounter (Signed)
Requested medication (s) are due for refill today: yes  Requested medication (s) are on the active medication list: yes  Last refill:  09/22/21 #43mL/0  Future visit scheduled: yes 12/25/21  Notes to clinic:  Unable to refill per protocol due to failed labs, no updated results. And Unable to refill per protocol, last refill by another provider.      Requested Prescriptions  Pending Prescriptions Disp Refills   insulin glargine (LANTUS) 100 UNIT/ML Solostar Pen 15 mL 0    Sig: Inject 18 Units into the skin daily.     Endocrinology:  Diabetes - Insulins Failed - 12/04/2021  1:51 PM      Failed - HBA1C is between 0 and 7.9 and within 180 days    HbA1c, POC (controlled diabetic range)  Date Value Ref Range Status  03/21/2021 12.2 (A) 0.0 - 7.0 % Final   Hgb A1c MFr Bld  Date Value Ref Range Status  09/18/2021 11.5 (H) 4.8 - 5.6 % Final    Comment:    (NOTE) Pre diabetes:          5.7%-6.4%  Diabetes:              >6.4%  Glycemic control for   <7.0% adults with diabetes          Failed - Valid encounter within last 6 months    Recent Outpatient Visits           8 months ago Hospital discharge follow-up   [place], [place] W, NP   8 months ago Type 2 diabetes mellitus with hyperglycemia, unspecified whether long term insulin use [facility])    [place], [place], [place]   11 months ago Elevated glucose   [place], Dionne Bu[place], [place]   1 year ago Hospital discharge follow-up   [place], Zelda W, NP   1 year ago Elevated glucose   [place], Vernia Buff, NP       Future Appointments             In 3 weeks [name], NP [place]

## 2021-12-25 ENCOUNTER — Ambulatory Visit: Payer: Medicaid Other | Admitting: Nurse Practitioner

## 2021-12-30 ENCOUNTER — Other Ambulatory Visit: Payer: Self-pay | Admitting: Nurse Practitioner

## 2021-12-30 DIAGNOSIS — E1165 Type 2 diabetes mellitus with hyperglycemia: Secondary | ICD-10-CM

## 2021-12-30 NOTE — Telephone Encounter (Signed)
Medication Refill - Medication: metFORMIN (GLUCOPHAGE-XR) 500 MG 24 hr tablet [[id number]]    folic acid (FOLVITE) 1 MG tablet   Has the patient contacted their pharmacy? Yes.   (Agent: If no, request that the patient contact the pharmacy for the refill. If patient does not wish to contact the pharmacy document the reason why and proceed with request.) (Agent: If yes, when and what did the pharmacy advise?) Waiting approval  Preferred Pharmacy (with phone number or street name): [id number] [place], [place] Has the patient been seen for an appointment in the last year OR does the patient have an upcoming appointment? Yes.    Agent: Please be advised that RX refills may take up to 3 business days. We ask that you follow-up with your pharmacy.

## 2021-12-31 MED ORDER — FOLIC ACID 1 MG PO TABS: 1.0000 mg | ORAL_TABLET | Freq: Every day | ORAL | 0 refills | Status: DC

## 2021-12-31 MED ORDER — METFORMIN HCL ER 500 MG PO TB24: 500.0000 mg | ORAL_TABLET | Freq: Two times a day (BID) | ORAL | 2 refills | Status: DC

## 2021-12-31 NOTE — Telephone Encounter (Signed)
Patient has future OV scheduled, will refill.  Requested Prescriptions  Pending Prescriptions Disp Refills  . metFORMIN (GLUCOPHAGE-XR) 500 MG 24 hr tablet 60 tablet 2    Sig: Take 1 tablet (500 mg total) by mouth in the morning and at bedtime.     Endocrinology:  Diabetes - Biguanides Failed - 12/30/2021 10:48 AM      Failed - HBA1C is between 0 and 7.9 and within 180 days    HbA1c, POC (controlled diabetic range)  Date Value Ref Range Status  03/21/2021 12.2 (A) 0.0 - 7.0 % Final   Hgb A1c MFr Bld  Date Value Ref Range Status  09/18/2021 11.5 (H) 4.8 - 5.6 % Final    Comment:    (NOTE) Pre diabetes:          5.7%-6.4%  Diabetes:              >6.4%  Glycemic control for   <7.0% adults with diabetes          Failed - B12 Level in normal range and within 720 days    Vitamin B-12  Date Value Ref Range Status  06/15/2019 457 180 - 914 pg/mL Final    Comment:    (NOTE) This assay is not validated for testing neonatal or myeloproliferative syndrome specimens for Vitamin B12 levels. Performed at [facility], [place] [address], [place], [place] [address]          Failed - Valid encounter within last 6 months    Recent Outpatient Visits          9 months ago Hospital discharge follow-up   [place], [place] W, NP   9 months ago Type 2 diabetes mellitus with hyperglycemia, unspecified whether long term insulin use [facility])   [place], [place], [place]   12 months ago Elevated glucose   [place], Dionne Bucy, [place]   1 year ago Hospital discharge follow-up   [place] [name], NP   1 year ago Elevated glucose   [place], Vernia Buff, NP      Future Appointments            In 1 month [name], NP [place] in  normal range and within 360 days    Creatinine, Ser  Date Value Ref Range Status  09/22/2021 0.64 0.61 - 1.24 mg/dL Final         Passed - eGFR in normal range and within 360 days    GFR calc Af Amer  Date Value Ref Range Status  08/17/2019 >60 >60 mL/min Final   GFR, Estimated  Date Value Ref Range Status  09/22/2021 >60 >60 mL/min Final    Comment:    (NOTE) Calculated using the CKD-EPI Creatinine Equation (2021)    eGFR  Date Value Ref Range Status  01/03/2021 75 >59 mL/min/1.73 Final         Passed - CBC within normal limits and completed in the last 12 months    WBC  Date Value Ref Range Status  09/22/2021 5.6 4.0 - 10.5 K/uL Final   RBC  Date Value Ref Range Status  09/22/2021 3.58 (L) 4.22 - 5.81 MIL/uL Final   Hemoglobin  Date Value Ref Range  Status  09/22/2021 13.0 13.0 - 17.0 g/dL Final  01/03/2021 17.7 13.0 - 17.7 g/dL Final   HCT  Date Value Ref Range Status  09/22/2021 38.1 (L) 39.0 - 52.0 % Final   Hematocrit  Date Value Ref Range Status  01/03/2021 53.1 (H) 37.5 - 51.0 % Final   MCHC  Date Value Ref Range Status  09/22/2021 34.1 30.0 - 36.0 g/dL Final   [facility]  Date Value Ref Range Status  09/22/2021 36.3 (H) 26.0 - 34.0 pg Final   MCV  Date Value Ref Range Status  09/22/2021 106.4 (H) 80.0 - 100.0 fL Final  01/03/2021 93 79 - 97 fL Final   No results found for: "PLTCOUNTKUC", "LABPLAT", "POCPLA" RDW  Date Value Ref Range Status  09/22/2021 14.6 11.5 - 15.5 % Final  01/03/2021 14.0 11.6 - 15.4 % Final         . folic acid (FOLVITE) 1 MG tablet 30 tablet 0    Sig: Take 1 tablet (1 mg total) by mouth daily.     Endocrinology:  Vitamins Passed - 12/30/2021 10:48 AM      Passed - Valid encounter within last 12 months    Recent Outpatient Visits          9 months ago Hospital discharge follow-up   [place], [place] W, NP   9 months ago Type 2 diabetes mellitus with hyperglycemia, unspecified  whether long term insulin use [facility])   [place], [place], [place]   12 months ago Elevated glucose   [place], Dionne Bucy, [place]   1 year ago Hospital discharge follow-up   [place], Zelda W, NP   1 year ago Elevated glucose   [place], Zelda W, NP      Future Appointments            In 1 month [name], NP [place]

## 2022-01-25 ENCOUNTER — Other Ambulatory Visit: Payer: Self-pay | Admitting: Nurse Practitioner

## 2022-01-28 NOTE — Telephone Encounter (Signed)
Requested Prescriptions  Pending Prescriptions Disp Refills   folic acid (FOLVITE) 1 MG tablet [Pharmacy Med Name: FOLIC ACID 1 MG TABLET] 90 tablet 0    Sig: TAKE 1 TABLET BY MOUTH EVERY DAY     Endocrinology:  Vitamins Passed - 01/25/2022  9:34 AM      Passed - Valid encounter within last 12 months    Recent Outpatient Visits           9 months ago Hospital discharge follow-up   [facility] [place], [place] W, NP   10 months ago Type 2 diabetes mellitus with hyperglycemia, unspecified whether long term insulin use [facility])   [facility] [place], [place], [place]   1 year ago Elevated glucose   [facility] [place], Marzella [place]lein, [place]   1 year ago Hospital discharge follow-up   [facility] Claiborne Rigg, NP   1 year ago Elevated glucose   [facility] [place], Shea Stakes, NP       Future Appointments             In 4 weeks Claiborne Rigg, NP [facility]

## 2022-02-25 ENCOUNTER — Ambulatory Visit: Payer: Medicaid Other | Admitting: Nurse Practitioner

## 2022-03-14 ENCOUNTER — Other Ambulatory Visit: Payer: Self-pay | Admitting: Family Medicine

## 2022-03-14 NOTE — Telephone Encounter (Signed)
Requested medication (s) are due for refill today:   Yes  Requested medication (s) are on the active medication list:   Yes  Future visit scheduled:   Yes 04/08/2022   Last ordered: 12/04/2021 15 ml, 0 refills   Returned because labs are due per protocol.   Provider to review for refills prior to upcoming appt.  Requested Prescriptions  Pending Prescriptions Disp Refills   LANTUS SOLOSTAR 100 UNIT/ML Solostar Pen [Pharmacy Med Name: LANTUS SOLOSTAR 100 UNIT/ML]      Sig: INJECT 18 UNITS INTO THE SKIN DAILY.     Endocrinology:  Diabetes - Insulins Failed - 03/14/2022  2:48 PM      Failed - HBA1C is between 0 and 7.9 and within 180 days    HbA1c, POC (controlled diabetic range)  Date Value Ref Range Status  03/21/2021 12.2 (A) 0.0 - 7.0 % Final   Hgb A1c MFr Bld  Date Value Ref Range Status  09/18/2021 11.5 (H) 4.8 - 5.6 % Final    Comment:    (NOTE) Pre diabetes:          5.7%-6.4%  Diabetes:              >6.4%  Glycemic control for   <7.0% adults with diabetes          Failed - Valid encounter within last 6 months    Recent Outpatient Visits           11 months ago Hospital discharge follow-up   [place], [place] W, NP   11 months ago Type 2 diabetes mellitus with hyperglycemia, unspecified whether long term insulin use [facility])   [place], [place], [place]   1 year ago Elevated glucose   [place], Dionne Bu[place], [place]   1 year ago Hospital discharge follow-up   [place], Zelda W, NP   1 year ago Elevated glucose   [place], Vernia Buff, NP       Future Appointments             In 3 weeks [name], NP [place]

## 2022-03-30 ENCOUNTER — Other Ambulatory Visit: Payer: Self-pay | Admitting: Nurse Practitioner

## 2022-03-30 DIAGNOSIS — E1165 Type 2 diabetes mellitus with hyperglycemia: Secondary | ICD-10-CM

## 2022-03-31 NOTE — Telephone Encounter (Signed)
Requested medication (s) are due for refill today: yes  Requested medication (s) are on the active medication list: yes  Last refill:  12/31/21 #60/2  Future visit scheduled: yes 04/08/22  Notes to clinic:  pt is due for OV and updated labs, pt has appt on 04/08/22.      Requested Prescriptions  Pending Prescriptions Disp Refills   metFORMIN (GLUCOPHAGE-XR) 500 MG 24 hr tablet [Pharmacy Med Name: METFORMIN HCL ER 500 MG TABLET] 180 tablet     Sig: Take 1 tablet (500 mg total) by mouth in the morning and at bedtime.     Endocrinology:  Diabetes - Biguanides Failed - 03/30/2022 12:55 AM      Failed - HBA1C is between 0 and 7.9 and within 180 days    HbA1c, POC (controlled diabetic range)  Date Value Ref Range Status  03/21/2021 12.2 (A) 0.0 - 7.0 % Final   Hgb A1c MFr Bld  Date Value Ref Range Status  09/18/2021 11.5 (H) 4.8 - 5.6 % Final    Comment:    (NOTE) Pre diabetes:          5.7%-6.4%  Diabetes:              >6.4%  Glycemic control for   <7.0% adults with diabetes          Failed - B12 Level in normal range and within 720 days    Vitamin B-12  Date Value Ref Range Status  06/15/2019 457 180 - 914 pg/mL Final    Comment:    (NOTE) This assay is not validated for testing neonatal or myeloproliferative syndrome specimens for Vitamin B12 levels. Performed at [facility], [place] [address], [place], [place] [address]          Failed - Valid encounter within last 6 months    Recent Outpatient Visits           12 months ago Hospital discharge follow-up   [place], [place] W, NP   1 year ago Type 2 diabetes mellitus with hyperglycemia, unspecified whether long term insulin use [facility])   [place], [place], [place]   1 year ago Elevated glucose   [place], [place]   1 year ago Hospital discharge follow-up   [facility] [name], NP   1 year ago Elevated glucose   [facility] [name], NP       Future Appointments             In 1 week [name], NP [place] in normal range and within 360 days    Creatinine, Ser  Date Value Ref Range Status  09/22/2021 0.64 0.61 - 1.24 mg/dL Final         Passed - eGFR in normal range and within 360 days    GFR calc Af Amer  Date Value Ref Range Status  08/17/2019 >60 >60 mL/min Final   GFR, Estimated  Date Value Ref Range Status  09/22/2021 >60 >60 mL/min Final    Comment:    (NOTE) Calculated using the CKD-EPI Creatinine Equation (2021)    eGFR  Date Value Ref Range Status  01/03/2021 75 >59 mL/min/1.73  Final         Passed - CBC within normal limits and completed in the last 12 months    WBC  Date Value Ref Range Status  09/22/2021 5.6 4.0 - 10.5 K/uL Final   RBC  Date Value Ref Range Status  09/22/2021 3.58 (L) 4.22 - 5.81 MIL/uL Final   Hemoglobin  Date Value Ref Range Status  09/22/2021 13.0 13.0 - 17.0 g/dL Final  01/03/2021 17.7 13.0 - 17.7 g/dL Final   HCT  Date Value Ref Range Status  09/22/2021 38.1 (L) 39.0 - 52.0 % Final   Hematocrit  Date Value Ref Range Status  01/03/2021 53.1 (H) 37.5 - 51.0 % Final   MCHC  Date Value Ref Range Status  09/22/2021 34.1 30.0 - 36.0 g/dL Final   [facility]  Date Value Ref Range Status  09/22/2021 36.3 (H) 26.0 - 34.0 pg Final   MCV  Date Value Ref Range Status  09/22/2021 106.4 (H) 80.0 - 100.0 fL Final  01/03/2021 93 79 - 97 fL Final   No results found for: "PLTCOUNTKUC", "LABPLAT", "POCPLA" RDW  Date Value Ref Range Status  09/22/2021 14.6 11.5 - 15.5 % Final  01/03/2021 14.0 11.6 - 15.4 % Final

## 2022-03-31 NOTE — Telephone Encounter (Signed)
Pt called saying he does not need the Metformin.  He has plenty of those.  He needs a refill Lorsartin 50 mg  [facility]  [place]  CB#  [phone]

## 2022-04-08 ENCOUNTER — Encounter: Payer: Self-pay | Admitting: Nurse Practitioner

## 2022-04-08 ENCOUNTER — Other Ambulatory Visit: Payer: Self-pay | Admitting: Nurse Practitioner

## 2022-04-08 ENCOUNTER — Ambulatory Visit: Payer: 59 | Attending: Nurse Practitioner | Admitting: Nurse Practitioner

## 2022-04-08 VITALS — BP 168/81 | HR 82 | Ht 73.0 in | Wt 242.6 lb

## 2022-04-08 DIAGNOSIS — Z794 Long term (current) use of insulin: Secondary | ICD-10-CM

## 2022-04-08 DIAGNOSIS — I10 Essential (primary) hypertension: Secondary | ICD-10-CM

## 2022-04-08 DIAGNOSIS — Z8719 Personal history of other diseases of the digestive system: Secondary | ICD-10-CM | POA: Diagnosis not present

## 2022-04-08 DIAGNOSIS — E1165 Type 2 diabetes mellitus with hyperglycemia: Secondary | ICD-10-CM

## 2022-04-08 DIAGNOSIS — E785 Hyperlipidemia, unspecified: Secondary | ICD-10-CM | POA: Diagnosis not present

## 2022-04-08 LAB — POCT GLYCOSYLATED HEMOGLOBIN (HGB A1C): HbA1c, POC (controlled diabetic range): 5.9 % (ref 0.0–7.0)

## 2022-04-08 LAB — GLUCOSE, POCT (MANUAL RESULT ENTRY): POC Glucose: 149 mg/dl — AB (ref 70–99)

## 2022-04-08 MED ORDER — LANTUS SOLOSTAR 100 UNIT/ML ~~LOC~~ SOPN: 15.0000 [IU] | PEN_INJECTOR | Freq: Every day | SUBCUTANEOUS | 6 refills | Status: DC

## 2022-04-08 MED ORDER — LOSARTAN POTASSIUM 50 MG PO TABS: 50.0000 mg | ORAL_TABLET | Freq: Every day | ORAL | 1 refills | Status: DC

## 2022-04-08 MED ORDER — ACETAMINOPHEN-CODEINE 300-30 MG PO TABS: 1.0000 | ORAL_TABLET | Freq: Three times a day (TID) | ORAL | 0 refills | Status: DC | PRN

## 2022-04-08 MED ORDER — METFORMIN HCL ER 500 MG PO TB24: 500.0000 mg | ORAL_TABLET | Freq: Two times a day (BID) | ORAL | 1 refills | Status: DC
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
## 2022-04-09 LAB — LIPID PANEL
Chol/HDL Ratio: 2.9 ratio (ref 0.0–5.0)
Cholesterol, Total: 194 mg/dL (ref 100–199)
HDL: 68 mg/dL (ref >39)
LDL Chol Calc (NIH): 110 mg/dL — ABNORMAL HIGH (ref 0–99)
Triglycerides: 87 mg/dL (ref 0–149)
VLDL Cholesterol Cal: 16 mg/dL (ref 5–40)

## 2022-04-09 LAB — CBC WITH DIFFERENTIAL/PLATELET
Basophils Absolute: 0.1 10*3/uL (ref 0.0–0.2)
Basos: 1 %
EOS (ABSOLUTE): 0.1 10*3/uL (ref 0.0–0.4)
Eos: 1 %
Hematocrit: 43.1 % (ref 37.5–51.0)
Hemoglobin: 15 g/dL (ref 13.0–17.7)
Immature Grans (Abs): 0.1 10*3/uL (ref 0.0–0.1)
Immature Granulocytes: 1 %
Lymphocytes Absolute: 3.6 10*3/uL — ABNORMAL HIGH (ref 0.7–3.1)
Lymphs: 34 %
MCH: 35 pg — ABNORMAL HIGH (ref 26.6–33.0)
MCHC: 34.8 g/dL (ref 31.5–35.7)
MCV: 101 fL — ABNORMAL HIGH (ref 79–97)
Monocytes Absolute: 0.7 10*3/uL (ref 0.1–0.9)
Monocytes: 6 %
Neutrophils Absolute: 6 10*3/uL (ref 1.4–7.0)
Neutrophils: 57 %
Platelets: 213 10*3/uL (ref 150–450)
RBC: 4.29 x10E6/uL (ref 4.14–5.80)
RDW: 12.2 % (ref 11.6–15.4)
WBC: 10.4 10*3/uL (ref 3.4–10.8)

## 2022-04-09 LAB — CMP14+EGFR
ALT: 18 IU/L (ref 0–44)
AST: 30 IU/L (ref 0–40)
Albumin/Globulin Ratio: 1.9 (ref 1.2–2.2)
Albumin: 4.8 g/dL (ref 3.8–4.9)
Alkaline Phosphatase: 104 IU/L (ref 44–121)
BUN/Creatinine Ratio: 16 (ref 9–20)
BUN: 20 mg/dL (ref 6–24)
Bilirubin Total: 0.5 mg/dL (ref 0.0–1.2)
CO2: 21 mmol/L (ref 20–29)
Calcium: 9.6 mg/dL (ref 8.7–10.2)
Chloride: 101 mmol/L (ref 96–106)
Creatinine, Ser: 1.23 mg/dL (ref 0.76–1.27)
Globulin, Total: 2.5 g/dL (ref 1.5–4.5)
Glucose: 129 mg/dL — ABNORMAL HIGH (ref 70–99)
Potassium: 4.3 mmol/L (ref 3.5–5.2)
Sodium: 139 mmol/L (ref 134–144)
Total Protein: 7.3 g/dL (ref 6.0–8.5)
eGFR: 68 mL/min/{1.73_m2} (ref >59)

## 2022-04-09 LAB — LIPASE: Lipase: 19 U/L (ref 13–78)

## 2022-04-09 LAB — AMYLASE: Amylase: 60 U/L (ref 31–110)

## 2022-04-09 MED ORDER — TOUJEO SOLOSTAR 300 UNIT/ML ~~LOC~~ SOPN: 15.0000 [IU] | PEN_INJECTOR | Freq: Every day | SUBCUTANEOUS | 2 refills | Status: DC

## 2022-04-09 NOTE — Telephone Encounter (Signed)
Requested medication (s) are due for refill today: yes  Requested medication (s) are on the active medication list: yes  Last refill:  04/08/22  Future visit scheduled: yes  Notes to clinic:    Alternative Requested:THE PRESCRIBED MEDICATION IS NOT COVERED BY INSURANCE. PLEASE CONSIDER CHANGING TO ONE OF THE SUGGESTED COVERED ALTERNATIVES.   All Pharmacy Suggested Alternatives:  insulin glargine (LANTUS SOLOSTAR) 100 UNIT/ML Solostar Pen insulin glargine, 1 Unit Dial, (TOUJEO SOLOSTAR) 300 UNIT/ML Solostar Pen insulin degludec (TRESIBA FLEXTOUCH) 100 UNIT/ML FlexTouch Pen insulin detemir (LEVEMIR FLEXTOUCH) 100 UNIT/ML FlexPen       Requested Prescriptions  Pending Prescriptions Disp Refills   LANTUS SOLOSTAR 100 UNIT/ML Solostar Pen [Pharmacy Med Name: LANTUS SOLOSTAR 100 UNIT/ML]  0     Endocrinology:  Diabetes - Insulins Passed - 04/08/2022  5:16 PM      Passed - HBA1C is between 0 and 7.9 and within 180 days    HbA1c, POC (controlled diabetic range)  Date Value Ref Range Status  04/08/2022 5.9 0.0 - 7.0 % Final         Passed - Valid encounter within last 6 months    Recent Outpatient Visits           Yesterday Type 2 diabetes mellitus with hyperglycemia, with long-term current use of insulin [facility])   [place], Vernia Buff, NP   1 year ago Hospital discharge follow-up   [facility] [place], [place] W, NP   1 year ago Type 2 diabetes mellitus with hyperglycemia, unspecified whether long term insulin use [facility])   [place], [place], [place]   1 year ago Elevated glucose   [facility] [place], Dionne [place], [place]   1 year ago Hospital discharge follow-up   [facility] [name], NP

## 2022-04-10 ENCOUNTER — Encounter: Payer: Self-pay | Admitting: Nurse Practitioner

## 2022-04-10 LAB — DRUG SCREEN 12+ALCOHOL+CRT, UR
Amphetamines, Urine: NEGATIVE ng/mL
BENZODIAZ UR QL: NEGATIVE ng/mL
Barbiturate: NEGATIVE ng/mL
Cannabinoids: NEGATIVE ng/mL
Cocaine (Metabolite): NEGATIVE ng/mL
Creatinine, Urine: 208.2 mg/dL (ref 20.0–300.0)
Ethanol, Urine: NEGATIVE %
Meperidine: NEGATIVE ng/mL
Methadone: NEGATIVE ng/mL
OPIATE SCREEN URINE: NEGATIVE ng/mL
Oxycodone/Oxymorphone, Urine: NEGATIVE ng/mL
Phencyclidine: NEGATIVE ng/mL
Propoxyphene: NEGATIVE ng/mL
Tramadol: NEGATIVE ng/mL

## 2022-04-21 ENCOUNTER — Other Ambulatory Visit: Payer: Self-pay | Admitting: Nurse Practitioner

## 2022-04-21 DIAGNOSIS — E1165 Type 2 diabetes mellitus with hyperglycemia: Secondary | ICD-10-CM

## 2022-04-21 NOTE — Telephone Encounter (Signed)
Pt called saying he needs BD [place] short pen needles 8 mm x 31 g 90 days  These were prescribed by the hospital a while ago  [facility] [place]   CB# [phone]

## 2022-04-22 MED ORDER — "PEN NEEDLES 5/16"" 31G X 8 MM MISC": 1.0000 | Freq: Every day | 0 refills | Status: DC

## 2022-04-22 NOTE — Telephone Encounter (Signed)
Requested Prescriptions  Pending Prescriptions Disp Refills   Insulin Pen Needle (PEN NEEDLES 31GX5/16") 31G X 8 MM MISC 100 each 0    Sig: 1 each by Does not apply route daily.     Endocrinology: Diabetes - Testing Supplies Passed - 04/21/2022  1:17 PM      Passed - Valid encounter within last 12 months    Recent Outpatient Visits           2 weeks ago Type 2 diabetes mellitus with hyperglycemia, with long-term current use of insulin [facility])   [place], Vernia Buff, NP   1 year ago Hospital discharge follow-up   [facility] [place], [place] W, NP   1 year ago Type 2 diabetes mellitus with hyperglycemia, unspecified whether long term insulin use [facility])   [place], [place], [place]   1 year ago Elevated glucose   [facility] [place], Dionne [place], [place]   1 year ago Hospital discharge follow-up   [facility] [name], NP

## 2022-04-22 NOTE — Telephone Encounter (Signed)
Pt called saying he used his last needle this morning  he needs refill asap  please.

## 2022-04-30 ENCOUNTER — Ambulatory Visit: Payer: 59

## 2022-06-10 ENCOUNTER — Other Ambulatory Visit: Payer: Self-pay

## 2022-06-10 ENCOUNTER — Telehealth: Payer: Self-pay

## 2022-06-10 DIAGNOSIS — E1165 Type 2 diabetes mellitus with hyperglycemia: Secondary | ICD-10-CM

## 2022-06-10 NOTE — Telephone Encounter (Signed)
Copied from CRM [id number]. Topic: Referral - Request for Referral >> Jun 10, 2022 10:37 AM Haroldine Laws wrote: Pt called asking who [name] would refer him to for eye care.  [phone]

## 2022-06-10 NOTE — Telephone Encounter (Signed)
Referral placed.

## 2022-06-18 DIAGNOSIS — H18712 Corneal ectasia, left eye: Secondary | ICD-10-CM | POA: Diagnosis not present

## 2022-06-18 DIAGNOSIS — H40013 Open angle with borderline findings, low risk, bilateral: Secondary | ICD-10-CM | POA: Diagnosis not present

## 2022-06-18 DIAGNOSIS — H18603 Keratoconus, unspecified, bilateral: Secondary | ICD-10-CM | POA: Diagnosis not present

## 2022-06-18 DIAGNOSIS — H25813 Combined forms of age-related cataract, bilateral: Secondary | ICD-10-CM | POA: Diagnosis not present

## 2022-06-18 DIAGNOSIS — E119 Type 2 diabetes mellitus without complications: Secondary | ICD-10-CM | POA: Diagnosis not present

## 2022-07-07 LAB — HEMOGLOBIN A1C: Hemoglobin A1C: 5.9

## 2022-07-18 ENCOUNTER — Other Ambulatory Visit: Payer: Self-pay | Admitting: Nurse Practitioner

## 2022-07-18 DIAGNOSIS — E1165 Type 2 diabetes mellitus with hyperglycemia: Secondary | ICD-10-CM

## 2022-07-18 NOTE — Telephone Encounter (Signed)
Requested Prescriptions  Pending Prescriptions Disp Refills   B-D ULTRAFINE III SHORT PEN 31G X 8 MM MISC [Pharmacy Med Name: BD UF SHORT PEN NEEDLE 8MMX31G] 100 each 0    Sig: USE AS DIRECTED     Endocrinology: Diabetes - Testing Supplies Passed - 07/18/2022  2:19 PM      Passed - Valid encounter within last 12 months    Recent Outpatient Visits           3 months ago Type 2 diabetes mellitus with hyperglycemia, with long-term current use of insulin [facility])   [place] [facility] [place], Shea Stakes, NP   1 year ago Hospital discharge follow-up   [facility] [place], [place] W, NP   1 year ago Type 2 diabetes mellitus with hyperglycemia, unspecified whether long term insulin use [facility])   [place] [facility] [place], [place], [place]   1 year ago Elevated glucose   [facility] [place], Marzella [place], [place]   1 year ago Hospital discharge follow-up   [facility] Claiborne Rigg, NP

## 2022-08-04 ENCOUNTER — Other Ambulatory Visit: Payer: Self-pay | Admitting: *Deleted

## 2022-08-04 ENCOUNTER — Ambulatory Visit: Payer: Self-pay | Admitting: *Deleted

## 2022-08-04 DIAGNOSIS — Z8719 Personal history of other diseases of the digestive system: Secondary | ICD-10-CM

## 2022-08-04 NOTE — Telephone Encounter (Signed)
Spoke with patient . Verified name & DOB  Advised per PCP Needs to go to emergency room. I do not prescribe pain medications for undiagnosed pancreatitis

## 2022-08-04 NOTE — Telephone Encounter (Signed)
  Chief Complaint: requesting tylenol #3. Possible flare up  pancreatitis  Symptoms: abdominal pain level 10 constant radiates to back. Vomiting, diarrhea. Has been doing recommended treatment for flare up ex drinking clear fluids. Frequency: yesterday  Pertinent Negatives: Patient denies fever Disposition: [] ED /[] Urgent Care (no appt availability in office) / [] Appointment(In office/virtual)/ []  [facility]/ [] Home Care/ [x] Refused Recommended Disposition /[]  Mobile Bus/ []  Follow-up with PCP Additional Notes:   Requesting refill for tylenol #3 . Reports he does not want to go back to ED due to  "They usually hospitalize me and I know how to treat " pancreatitis now. Reports his dog is sick ( CHF) and does not want to leave dog alone. Please advise . Patient did not want appt .  Reports he has this as hx.     Reason for Disposition  [1] MILD-MODERATE pain AND [2] constant AND [3] present > 2 hours  Answer Assessment - Initial Assessment Questions 1. LOCATION: "Where does it hurt?"      Center of abdomen "feel like it is a flare up pancreatitis" 2. RADIATION: "Does the pain shoot anywhere else?" (e.g., chest, back)     Radiated to back  3. ONSET: "When did the pain begin?" (Minutes, hours or days ago)      Started yesterday  4. SUDDEN: "Gradual or sudden onset?"     Na  5. PATTERN "Does the pain come and go, or is it constant?"    - If it comes and goes: "How long does it last?" "Do you have pain now?"     (Note: Comes and goes means the pain is intermittent. It goes away completely between bouts.)    - If constant: "Is it getting better, staying the same, or getting worse?"      (Note: Constant means the pain never goes away completely; most serious pain is constant and gets worse.)      Constant  6. SEVERITY: "How bad is the pain?"  (e.g., Scale 1-10; mild, moderate, or severe)    - MILD (1-3): Doesn't interfere with normal activities, abdomen soft and not  tender to touch.     - MODERATE (4-7): Interferes with normal activities or awakens from sleep, abdomen tender to touch.     - SEVERE (8-10): Excruciating pain, doubled over, unable to do any normal activities.       Level 10  patient feels  7. RECURRENT SYMPTOM: "Have you ever had this type of stomach pain before?" If Yes, ask: "When was the last time?" and "What happened that time?"      Yes  8. CAUSE: "What do you think is causing the stomach pain?"     Pancreatitis flare up  9. RELIEVING/AGGRAVATING FACTORS: "What makes it better or worse?" (e.g., antacids, bending or twisting motion, bowel movement)     Patient reports drinking clear fluids 10. OTHER SYMPTOMS: "Do you have any other symptoms?" (e.g., back pain, diarrhea, fever, urination pain, vomiting)       Back pain diarrhea vomiting . No fever  Protocols used: Abdominal Pain - Male-A-AH

## 2022-08-04 NOTE — Telephone Encounter (Signed)
Needs to go to emergency room. I do not prescribe pain medications for undiagnosed pancreatitis

## 2022-08-04 NOTE — Telephone Encounter (Signed)
Requested medication (s) are due for refill today: yes  Requested medication (s) are on the active medication list: yes  Last refill:  04/08/22 #30 0 refills  Future visit scheduled: yes in 8 days   Notes to clinic:  not delegated per protocol. Requesting refill due to flare up pancreatitis. Do you want to refill Rx?      Requested Prescriptions  Pending Prescriptions Disp Refills   acetaminophen-codeine (TYLENOL #3) 300-30 MG tablet 30 tablet 0    Sig: Take 1-2 tablets by mouth every 8 (eight) hours as needed for moderate pain.     Not Delegated - Analgesics:  Opioid Agonist Combinations 2 Failed - 08/04/2022 10:22 AM      Failed - This refill cannot be delegated      Failed - Urine Drug Screen completed in last 360 days      Failed - Valid encounter within last 3 months    Recent Outpatient Visits           3 months ago Type 2 diabetes mellitus with hyperglycemia, with long-term current use of insulin [facility])   [facility] [place], Shea Stakes, NP   1 year ago Hospital discharge follow-up   [facility] [place], [place] W, NP   1 year ago Type 2 diabetes mellitus with hyperglycemia, unspecified whether long term insulin use [facility])   [place] [place], [place], [place]   1 year ago Elevated glucose   [facility] [place], Marzella Schlein, [place]   1 year ago Hospital discharge follow-up   [facility] [place], [place] W, NP              Passed - Cr in normal range and within 360 days    Creatinine, Ser  Date Value Ref Range Status  04/08/2022 1.23 0.76 - 1.27 mg/dL Final         Passed - eGFR is 10 or above and within 360 days    GFR calc Af Amer  Date Value Ref Range Status  08/17/2019 >60 >60 mL/min Final   GFR, Estimated  Date Value Ref Range Status  09/22/2021 >60 >60 mL/min Final    Comment:     (NOTE) Calculated using the CKD-EPI Creatinine Equation (2021)    eGFR  Date Value Ref Range Status  04/08/2022 68 >59 mL/min/1.73 Final         Passed - Patient is not pregnant

## 2022-08-06 ENCOUNTER — Emergency Department (HOSPITAL_COMMUNITY): Payer: 59

## 2022-08-06 ENCOUNTER — Encounter (HOSPITAL_COMMUNITY): Payer: Self-pay

## 2022-08-06 ENCOUNTER — Emergency Department (HOSPITAL_COMMUNITY)
Admission: EM | Admit: 2022-08-06 | Discharge: 2022-08-06 | Disposition: A | Discharge status: Home / Self Care | Payer: 59 | Attending: Student | Admitting: Student

## 2022-08-06 DIAGNOSIS — R110 Nausea: Secondary | ICD-10-CM | POA: Diagnosis not present

## 2022-08-06 DIAGNOSIS — K7689 Other specified diseases of liver: Secondary | ICD-10-CM | POA: Diagnosis not present

## 2022-08-06 DIAGNOSIS — R112 Nausea with vomiting, unspecified: Secondary | ICD-10-CM | POA: Diagnosis not present

## 2022-08-06 DIAGNOSIS — Z743 Need for continuous supervision: Secondary | ICD-10-CM | POA: Diagnosis not present

## 2022-08-06 DIAGNOSIS — E119 Type 2 diabetes mellitus without complications: Secondary | ICD-10-CM | POA: Diagnosis not present

## 2022-08-06 DIAGNOSIS — R109 Unspecified abdominal pain: Secondary | ICD-10-CM | POA: Diagnosis not present

## 2022-08-06 DIAGNOSIS — F17210 Nicotine dependence, cigarettes, uncomplicated: Secondary | ICD-10-CM | POA: Insufficient documentation

## 2022-08-06 DIAGNOSIS — Z7984 Long term (current) use of oral hypoglycemic drugs: Secondary | ICD-10-CM | POA: Insufficient documentation

## 2022-08-06 DIAGNOSIS — Z794 Long term (current) use of insulin: Secondary | ICD-10-CM | POA: Insufficient documentation

## 2022-08-06 DIAGNOSIS — R1013 Epigastric pain: Secondary | ICD-10-CM

## 2022-08-06 DIAGNOSIS — R6889 Other general symptoms and signs: Secondary | ICD-10-CM | POA: Diagnosis not present

## 2022-08-06 LAB — CBC WITH DIFFERENTIAL/PLATELET
Abs Immature Granulocytes: 0.04 10*3/uL (ref 0.00–0.07)
Basophils Absolute: 0.1 10*3/uL (ref 0.0–0.1)
Basophils Relative: 1 %
Eosinophils Absolute: 0.2 10*3/uL (ref 0.0–0.5)
Eosinophils Relative: 2 %
HCT: 37.1 % — ABNORMAL LOW (ref 39.0–52.0)
Hemoglobin: 13.3 g/dL (ref 13.0–17.0)
Immature Granulocytes: 1 %
Lymphocytes Relative: 32 %
Lymphs Abs: 2.7 10*3/uL (ref 0.7–4.0)
MCH: 35.2 pg — ABNORMAL HIGH (ref 26.0–34.0)
MCHC: 35.8 g/dL (ref 30.0–36.0)
MCV: 98.1 fL (ref 80.0–100.0)
Monocytes Absolute: 0.5 10*3/uL (ref 0.1–1.0)
Monocytes Relative: 6 %
Neutro Abs: 4.9 10*3/uL (ref 1.7–7.7)
Neutrophils Relative %: 58 %
Platelets: 175 10*3/uL (ref 150–400)
RBC: 3.78 MIL/uL — ABNORMAL LOW (ref 4.22–5.81)
RDW: 12.2 % (ref 11.5–15.5)
WBC: 8.4 10*3/uL (ref 4.0–10.5)
nRBC: 0 % (ref 0.0–0.2)

## 2022-08-06 LAB — COMPREHENSIVE METABOLIC PANEL
ALT: 23 U/L (ref 0–44)
AST: 32 U/L (ref 15–41)
Albumin: 4.5 g/dL (ref 3.5–5.0)
Alkaline Phosphatase: 75 U/L (ref 38–126)
Anion gap: 15 (ref 5–15)
BUN: 17 mg/dL (ref 6–20)
CO2: 21 mmol/L — ABNORMAL LOW (ref 22–32)
Calcium: 9.5 mg/dL (ref 8.9–10.3)
Chloride: 99 mmol/L (ref 98–111)
Creatinine, Ser: 1.15 mg/dL (ref 0.61–1.24)
GFR, Estimated: >60 mL/min (ref >60)
Glucose, Bld: 209 mg/dL — ABNORMAL HIGH (ref 70–99)
Potassium: 3.9 mmol/L (ref 3.5–5.1)
Sodium: 135 mmol/L (ref 135–145)
Total Bilirubin: 1.2 mg/dL (ref 0.3–1.2)
Total Protein: 8.1 g/dL (ref 6.5–8.1)

## 2022-08-06 LAB — URINALYSIS, ROUTINE W REFLEX MICROSCOPIC
Bilirubin Urine: NEGATIVE
Glucose, UA: NEGATIVE mg/dL
Hgb urine dipstick: NEGATIVE
Ketones, ur: 5 mg/dL — AB
Leukocytes,Ua: NEGATIVE
Nitrite: NEGATIVE
Protein, ur: NEGATIVE mg/dL
Specific Gravity, Urine: 1.02 (ref 1.005–1.030)
pH: 5 (ref 5.0–8.0)

## 2022-08-06 LAB — CBG MONITORING, ED: Glucose-Capillary: 183 mg/dL — ABNORMAL HIGH (ref 70–99)

## 2022-08-06 LAB — LIPASE, BLOOD: Lipase: 50 U/L (ref 11–51)

## 2022-08-06 MED ORDER — SODIUM CHLORIDE (PF) 0.9 % IJ SOLN
INTRAMUSCULAR | Status: AC
  Filled 2022-08-06: qty 50

## 2022-08-06 MED ORDER — ESOMEPRAZOLE MAGNESIUM 40 MG PO CPDR: 40.0000 mg | DELAYED_RELEASE_CAPSULE | Freq: Two times a day (BID) | ORAL | 0 refills | Status: DC

## 2022-08-06 MED ORDER — ALUM & MAG HYDROXIDE-SIMETH 200-200-20 MG/5ML PO SUSP
30.0000 mL | Freq: Once | ORAL | Status: AC
  Administered 2022-08-06: 30 mL via ORAL
  Filled 2022-08-06: qty 30

## 2022-08-06 MED ORDER — LIDOCAINE VISCOUS HCL 2 % MT SOLN
15.0000 mL | Freq: Once | OROMUCOSAL | Status: AC
  Administered 2022-08-06: 15 mL via ORAL
  Filled 2022-08-06: qty 15

## 2022-08-06 MED ORDER — KETOROLAC TROMETHAMINE 15 MG/ML IJ SOLN
15.0000 mg | Freq: Once | INTRAMUSCULAR | Status: AC
  Administered 2022-08-06: 15 mg via INTRAVENOUS
  Filled 2022-08-06: qty 1

## 2022-08-06 MED ORDER — HYDROMORPHONE HCL 1 MG/ML IJ SOLN
1.0000 mg | Freq: Once | INTRAMUSCULAR | Status: AC
  Administered 2022-08-06: 1 mg via INTRAVENOUS
  Filled 2022-08-06: qty 1

## 2022-08-06 MED ORDER — FAMOTIDINE IN NACL 20-0.9 MG/50ML-% IV SOLN
20.0000 mg | Freq: Once | INTRAVENOUS | Status: AC
  Administered 2022-08-06: 20 mg via INTRAVENOUS
  Filled 2022-08-06: qty 50

## 2022-08-06 MED ORDER — IOHEXOL 300 MG/ML  SOLN
100.0000 mL | Freq: Once | INTRAMUSCULAR | Status: AC | PRN
  Administered 2022-08-06: 100 mL via INTRAVENOUS

## 2022-08-06 MED ORDER — MAALOX MAX 400-400-40 MG/5ML PO SUSP: 15.0000 mL | Freq: Four times a day (QID) | ORAL | 0 refills | Status: DC | PRN

## 2022-08-06 MED ORDER — LACTATED RINGERS IV BOLUS
1000.0000 mL | Freq: Once | INTRAVENOUS | Status: AC
  Administered 2022-08-06: 1000 mL via INTRAVENOUS

## 2022-08-06 NOTE — ED Triage Notes (Signed)
Pt arrived via EMS, from home. C/o abd pain, n/v first day then subsided. States feels similar to when he has had pancreatitis in the past.
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
## 2022-08-06 NOTE — ED Notes (Signed)
Patient transported to CT 

## 2022-08-11 ENCOUNTER — Encounter: Payer: Self-pay | Admitting: Pharmacist

## 2022-08-11 DIAGNOSIS — Z79899 Other long term (current) drug therapy: Secondary | ICD-10-CM

## 2022-08-11 NOTE — Progress Notes (Signed)
[facility]) [facility] Quality Pharmacy Team Statin Quality Measure Assessment  08/11/2022  Bryce Mendoza 10-19-1960 [id number]  Per review of chart and payor information, patient has a diagnosis of diabetes but is not currently filling a statin prescription.  This places patient into the Statin Use In Patients with Diabetes (SUPD) measure for CMS.    Atorvastatin was on the patient's medication list earlier this year but has been discontinued.  Patient has an upcoming follow up appointment 08/12/22.  If deemed therapeutically appropriate, statin therapy could be evaluated during the visit.  If patient experienced statin intolerance, a statin exclusion code could be associated with the visit (which would remove the patient from the measure).     The 10-year ASCVD risk score (Arnett DK, et al., 2019) is: 26%   Values used to calculate the score:     Age: 3 years     Sex: Male     Is Non-Hispanic African American: No     Diabetic: Yes     Tobacco smoker: Yes     Systolic Blood Pressure: 145 mmHg     Is BP treated: Yes     HDL Cholesterol: 68 mg/dL     Total Cholesterol: 194 mg/dL 4/0/9811     Component Value Date/Time   CHOL 194 04/08/2022 1559   TRIG 87 04/08/2022 1559   HDL 68 04/08/2022 1559   CHOLHDL 2.9 04/08/2022 1559   CHOLHDL 4.9 03/23/2021 0314   VLDL 37 03/23/2021 0314   LDLCALC 110 (H) 04/08/2022 1559    Please consider ONE of the following recommendations:  Initiate high intensity statin Atorvastatin 40 mg once daily, #90, 3 refills   Rosuvastatin 20 mg once daily, #90, 3 refills    Initiate moderate intensity          statin with reduced frequency if prior          statin intolerance 1x weekly, #13, 3 refills   2x weekly, #26, 3 refills   3x weekly, #39, 3 refills    Code for past statin intolerance or  other exclusions (required annually)  Provider Requirements: Associate code during an office visit or telehealth encounter  Drug Induced  Myopathy G72.0   Myopathy, unspecified G72.9   Myositis, unspecified M60.9   Rhabdomyolysis M62.82   Cirrhosis of liver K74.69   Prediabetes R73.03   PCOS E28.2   Plan:Send note to Patient's provider prior to the upcoming appointment.  Beecher Mcardle, PharmD, BCACP [facility] Clinical Pharmacist [phone]

## 2022-08-12 ENCOUNTER — Ambulatory Visit: Payer: 59 | Admitting: Nurse Practitioner

## 2022-08-26 ENCOUNTER — Ambulatory Visit: Payer: 59 | Admitting: Neurology

## 2022-09-02 ENCOUNTER — Other Ambulatory Visit: Payer: Self-pay | Admitting: Nurse Practitioner

## 2022-09-02 DIAGNOSIS — E1165 Type 2 diabetes mellitus with hyperglycemia: Secondary | ICD-10-CM

## 2022-09-06 ENCOUNTER — Encounter (HOSPITAL_COMMUNITY): Payer: Self-pay

## 2022-09-06 ENCOUNTER — Inpatient Hospital Stay (HOSPITAL_COMMUNITY)
Admission: EM | Admit: 2022-09-06 | Discharge: 2022-09-10 | DRG: 440 | Disposition: A | Discharge status: Home / Self Care | Payer: 59 | Attending: Internal Medicine | Admitting: Internal Medicine

## 2022-09-06 ENCOUNTER — Other Ambulatory Visit: Payer: Self-pay

## 2022-09-06 DIAGNOSIS — R6889 Other general symptoms and signs: Secondary | ICD-10-CM | POA: Diagnosis not present

## 2022-09-06 DIAGNOSIS — E1110 Type 2 diabetes mellitus with ketoacidosis without coma: Secondary | ICD-10-CM

## 2022-09-06 DIAGNOSIS — Z885 Allergy status to narcotic agent status: Secondary | ICD-10-CM | POA: Diagnosis not present

## 2022-09-06 DIAGNOSIS — E119 Type 2 diabetes mellitus without complications: Secondary | ICD-10-CM

## 2022-09-06 DIAGNOSIS — E785 Hyperlipidemia, unspecified: Secondary | ICD-10-CM | POA: Diagnosis present

## 2022-09-06 DIAGNOSIS — D72829 Elevated white blood cell count, unspecified: Secondary | ICD-10-CM

## 2022-09-06 DIAGNOSIS — R109 Unspecified abdominal pain: Secondary | ICD-10-CM | POA: Diagnosis not present

## 2022-09-06 DIAGNOSIS — F17210 Nicotine dependence, cigarettes, uncomplicated: Secondary | ICD-10-CM | POA: Diagnosis not present

## 2022-09-06 DIAGNOSIS — E669 Obesity, unspecified: Secondary | ICD-10-CM | POA: Diagnosis present

## 2022-09-06 DIAGNOSIS — Z743 Need for continuous supervision: Secondary | ICD-10-CM | POA: Diagnosis not present

## 2022-09-06 DIAGNOSIS — Z86718 Personal history of other venous thrombosis and embolism: Secondary | ICD-10-CM | POA: Diagnosis not present

## 2022-09-06 DIAGNOSIS — G6181 Chronic inflammatory demyelinating polyneuritis: Secondary | ICD-10-CM | POA: Diagnosis present

## 2022-09-06 DIAGNOSIS — Z794 Long term (current) use of insulin: Secondary | ICD-10-CM

## 2022-09-06 DIAGNOSIS — Z7984 Long term (current) use of oral hypoglycemic drugs: Secondary | ICD-10-CM

## 2022-09-06 DIAGNOSIS — R1084 Generalized abdominal pain: Secondary | ICD-10-CM | POA: Diagnosis not present

## 2022-09-06 DIAGNOSIS — G8929 Other chronic pain: Secondary | ICD-10-CM | POA: Diagnosis present

## 2022-09-06 DIAGNOSIS — G609 Hereditary and idiopathic neuropathy, unspecified: Secondary | ICD-10-CM | POA: Diagnosis present

## 2022-09-06 DIAGNOSIS — Z79899 Other long term (current) drug therapy: Secondary | ICD-10-CM | POA: Diagnosis not present

## 2022-09-06 DIAGNOSIS — K861 Other chronic pancreatitis: Secondary | ICD-10-CM | POA: Diagnosis present

## 2022-09-06 DIAGNOSIS — E1142 Type 2 diabetes mellitus with diabetic polyneuropathy: Secondary | ICD-10-CM | POA: Diagnosis present

## 2022-09-06 DIAGNOSIS — Z6831 Body mass index (BMI) 31.0-31.9, adult: Secondary | ICD-10-CM

## 2022-09-06 DIAGNOSIS — Z79890 Hormone replacement therapy (postmenopausal): Secondary | ICD-10-CM

## 2022-09-06 DIAGNOSIS — Z807 Family history of other malignant neoplasms of lymphoid, hematopoietic and related tissues: Secondary | ICD-10-CM

## 2022-09-06 DIAGNOSIS — K859 Acute pancreatitis without necrosis or infection, unspecified: Principal | ICD-10-CM | POA: Diagnosis present

## 2022-09-06 DIAGNOSIS — K824 Cholesterolosis of gallbladder: Secondary | ICD-10-CM | POA: Diagnosis present

## 2022-09-06 DIAGNOSIS — E876 Hypokalemia: Secondary | ICD-10-CM | POA: Diagnosis not present

## 2022-09-06 DIAGNOSIS — Z86711 Personal history of pulmonary embolism: Secondary | ICD-10-CM | POA: Diagnosis not present

## 2022-09-06 DIAGNOSIS — I10 Essential (primary) hypertension: Secondary | ICD-10-CM

## 2022-09-06 DIAGNOSIS — F1021 Alcohol dependence, in remission: Secondary | ICD-10-CM

## 2022-09-06 DIAGNOSIS — R030 Elevated blood-pressure reading, without diagnosis of hypertension: Secondary | ICD-10-CM | POA: Diagnosis present

## 2022-09-06 LAB — CBC WITH DIFFERENTIAL/PLATELET
Abs Immature Granulocytes: 0.06 10*3/uL (ref 0.00–0.07)
Basophils Absolute: 0.1 10*3/uL (ref 0.0–0.1)
Basophils Relative: 0 %
Eosinophils Absolute: 0.1 10*3/uL (ref 0.0–0.5)
Eosinophils Relative: 1 %
HCT: 38 % — ABNORMAL LOW (ref 39.0–52.0)
Hemoglobin: 13.4 g/dL (ref 13.0–17.0)
Immature Granulocytes: 0 %
Lymphocytes Relative: 13 %
Lymphs Abs: 1.8 10*3/uL (ref 0.7–4.0)
MCH: 34.4 pg — ABNORMAL HIGH (ref 26.0–34.0)
MCHC: 35.3 g/dL (ref 30.0–36.0)
MCV: 97.7 fL (ref 80.0–100.0)
Monocytes Absolute: 0.8 10*3/uL (ref 0.1–1.0)
Monocytes Relative: 6 %
Neutro Abs: 10.6 10*3/uL — ABNORMAL HIGH (ref 1.7–7.7)
Neutrophils Relative %: 80 %
Platelets: 193 10*3/uL (ref 150–400)
RBC: 3.89 MIL/uL — ABNORMAL LOW (ref 4.22–5.81)
RDW: 12 % (ref 11.5–15.5)
WBC: 13.4 10*3/uL — ABNORMAL HIGH (ref 4.0–10.5)
nRBC: 0 % (ref 0.0–0.2)

## 2022-09-06 LAB — COMPREHENSIVE METABOLIC PANEL
ALT: 26 U/L (ref 0–44)
AST: 26 U/L (ref 15–41)
Albumin: 4.1 g/dL (ref 3.5–5.0)
Alkaline Phosphatase: 77 U/L (ref 38–126)
Anion gap: 12 (ref 5–15)
BUN: 12 mg/dL (ref 6–20)
CO2: 19 mmol/L — ABNORMAL LOW (ref 22–32)
Calcium: 8.4 mg/dL — ABNORMAL LOW (ref 8.9–10.3)
Chloride: 104 mmol/L (ref 98–111)
Creatinine, Ser: 0.97 mg/dL (ref 0.61–1.24)
GFR, Estimated: >60 mL/min (ref >60)
Glucose, Bld: 179 mg/dL — ABNORMAL HIGH (ref 70–99)
Potassium: 3.4 mmol/L — ABNORMAL LOW (ref 3.5–5.1)
Sodium: 135 mmol/L (ref 135–145)
Total Bilirubin: 1.2 mg/dL (ref 0.3–1.2)
Total Protein: 7.3 g/dL (ref 6.5–8.1)

## 2022-09-06 LAB — URINALYSIS, ROUTINE W REFLEX MICROSCOPIC
Bilirubin Urine: NEGATIVE
Glucose, UA: NEGATIVE mg/dL
Hgb urine dipstick: NEGATIVE
Ketones, ur: 5 mg/dL — AB
Leukocytes,Ua: NEGATIVE
Nitrite: NEGATIVE
Protein, ur: 30 mg/dL — AB
Specific Gravity, Urine: 1.02 (ref 1.005–1.030)
pH: 6 (ref 5.0–8.0)

## 2022-09-06 LAB — LIPASE, BLOOD: Lipase: 732 U/L — ABNORMAL HIGH (ref 11–51)

## 2022-09-06 LAB — ETHANOL: Alcohol, Ethyl (B): <10 mg/dL (ref <10)

## 2022-09-06 LAB — MAGNESIUM: Magnesium: 1.4 mg/dL — ABNORMAL LOW (ref 1.7–2.4)

## 2022-09-06 MED ORDER — ENOXAPARIN SODIUM 60 MG/0.6ML IJ SOSY
60.0000 mg | PREFILLED_SYRINGE | INTRAMUSCULAR | Status: DC
  Administered 2022-09-06 – 2022-09-07 (×2): 60 mg via SUBCUTANEOUS
  Filled 2022-09-06 (×2): qty 0.6

## 2022-09-06 MED ORDER — POTASSIUM CHLORIDE 10 MEQ/100ML IV SOLN
10.0000 meq | Freq: Once | INTRAVENOUS | Status: AC
  Administered 2022-09-06: 10 meq via INTRAVENOUS
  Filled 2022-09-06: qty 100

## 2022-09-06 MED ORDER — POTASSIUM CHLORIDE 10 MEQ/100ML IV SOLN: 10.0000 meq | INTRAVENOUS | Status: DC

## 2022-09-06 MED ORDER — ACETAMINOPHEN 325 MG PO TABS
650.0000 mg | ORAL_TABLET | Freq: Four times a day (QID) | ORAL | Status: DC | PRN
  Administered 2022-09-07: 650 mg via ORAL
  Filled 2022-09-06: qty 2

## 2022-09-06 MED ORDER — ACETAMINOPHEN 650 MG RE SUPP: 650.0000 mg | Freq: Four times a day (QID) | RECTAL | Status: DC | PRN

## 2022-09-06 MED ORDER — LACTATED RINGERS IV SOLN: INTRAVENOUS | Status: DC

## 2022-09-06 MED ORDER — DROPERIDOL 2.5 MG/ML IJ SOLN
2.5000 mg | Freq: Once | INTRAMUSCULAR | Status: AC
  Administered 2022-09-06: 2.5 mg via INTRAVENOUS
  Filled 2022-09-06: qty 2

## 2022-09-06 MED ORDER — ONDANSETRON HCL 4 MG/2ML IJ SOLN
4.0000 mg | Freq: Four times a day (QID) | INTRAMUSCULAR | Status: DC | PRN
  Administered 2022-09-06: 4 mg via INTRAVENOUS
  Filled 2022-09-06: qty 2

## 2022-09-06 MED ORDER — ACETAMINOPHEN 500 MG PO TABS
1000.0000 mg | ORAL_TABLET | Freq: Once | ORAL | Status: AC
  Administered 2022-09-06: 1000 mg via ORAL
  Filled 2022-09-06: qty 2

## 2022-09-06 MED ORDER — HYDROMORPHONE HCL 1 MG/ML IJ SOLN
0.5000 mg | INTRAMUSCULAR | Status: DC | PRN
  Administered 2022-09-06: 0.5 mg via INTRAVENOUS
  Filled 2022-09-06: qty 1

## 2022-09-06 MED ORDER — DIPHENHYDRAMINE HCL 50 MG/ML IJ SOLN
25.0000 mg | Freq: Once | INTRAMUSCULAR | Status: AC
  Administered 2022-09-06: 25 mg via INTRAVENOUS
  Filled 2022-09-06: qty 1

## 2022-09-06 MED ORDER — KETOROLAC TROMETHAMINE 30 MG/ML IJ SOLN
30.0000 mg | Freq: Four times a day (QID) | INTRAMUSCULAR | Status: AC | PRN
  Administered 2022-09-06 – 2022-09-07 (×2): 30 mg via INTRAVENOUS
  Filled 2022-09-06 (×2): qty 1

## 2022-09-06 MED ORDER — METOPROLOL TARTRATE 5 MG/5ML IV SOLN: 2.5000 mg | Freq: Four times a day (QID) | INTRAVENOUS | Status: DC | PRN

## 2022-09-06 MED ORDER — LACTATED RINGERS IV BOLUS
1000.0000 mL | Freq: Once | INTRAVENOUS | Status: AC
  Administered 2022-09-06: 1000 mL via INTRAVENOUS

## 2022-09-06 MED ORDER — FENTANYL CITRATE PF 50 MCG/ML IJ SOSY
100.0000 ug | PREFILLED_SYRINGE | INTRAMUSCULAR | Status: DC | PRN
  Administered 2022-09-06: 100 ug via INTRAVENOUS
  Filled 2022-09-06: qty 2

## 2022-09-06 MED ORDER — OXYCODONE HCL 5 MG PO TABS
10.0000 mg | ORAL_TABLET | Freq: Once | ORAL | Status: AC
  Administered 2022-09-06: 10 mg via ORAL
  Filled 2022-09-06: qty 2

## 2022-09-06 MED ORDER — LIDOCAINE VISCOUS HCL 2 % MT SOLN
15.0000 mL | Freq: Once | OROMUCOSAL | Status: AC
  Administered 2022-09-06: 15 mL via ORAL
  Filled 2022-09-06: qty 15

## 2022-09-06 MED ORDER — ONDANSETRON HCL 4 MG/2ML IJ SOLN
4.0000 mg | Freq: Once | INTRAMUSCULAR | Status: AC
  Administered 2022-09-06: 4 mg via INTRAVENOUS
  Filled 2022-09-06: qty 2

## 2022-09-06 MED ORDER — ALUM & MAG HYDROXIDE-SIMETH 200-200-20 MG/5ML PO SUSP
30.0000 mL | Freq: Once | ORAL | Status: AC
  Administered 2022-09-06: 30 mL via ORAL
  Filled 2022-09-06: qty 30

## 2022-09-06 NOTE — Assessment & Plan Note (Addendum)
Likely reactive  Continue IVF Trend cbc/fever curve
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
## 2022-09-06 NOTE — Assessment & Plan Note (Signed)
Sober x 2 years, ethanol pending

## 2022-09-06 NOTE — Assessment & Plan Note (Addendum)
62 year old male presenting with 2 day history or worsening abdominal pain radiating to his back, nausea found to have acute on chronic pancreatitis.  -obs to tele x 24 hours -failed pain control in ED thus being admitted -continue IVF -lipase 732, will trend  -NPO except for meds/ice chips.  -pain control with toradol x 2 doses and PRN dilaudid.  -He has had no fever/chills. Does not meet SIRS critieria. He had a CT abdomen last month. If fever/worsening pain would CT -unsure what flared this. He has been sober x 2 years -lipid panel checked 2/24 and to goal normal TG

## 2022-09-06 NOTE — Assessment & Plan Note (Signed)
EMG done at [facility] 2022 and were wnl, but still suspected he had some neuropathy  On no medication

## 2022-09-06 NOTE — ED Triage Notes (Signed)
Patient brought in by EMS due to abdominal pain with chronic pancreatitis. Patient reports some nausea last night with dry heaving, but no actual vomiting. Reports pain generalized throughout the abdomen. States his abdomen is distended.

## 2022-09-06 NOTE — Assessment & Plan Note (Addendum)
Continue losartan Pain control  PRN lopressor if needed

## 2022-09-06 NOTE — ED Provider Notes (Signed)
[place] EMERGENCY DEPARTMENT AT [facility] Provider Note   CSN: [id number] Arrival date & time: 09/06/22  1606     History Chief Complaint  Patient presents with   Abdominal Pain    HPI Bryce Mendoza is a 62 y.o. male presenting for chief complaint of abdominal pain.  62 year old male with an extensive medical history including frequent evaluations for similar.  Denies fevers chills nausea vomiting syncope shortness of breath. No known sick contacts.  States that his pain was worse yesterday but had improved overnight and then came back again last night. Patient stated "Dilaudid is the only medication that controls my pain"  Patient's recorded medical, surgical, social, medication list and allergies were reviewed in the Snapshot window as part of the initial history.   Review of Systems   Review of Systems  Constitutional:  Negative for chills and fever.  HENT:  Negative for ear pain and sore throat.   Eyes:  Negative for pain and visual disturbance.  Respiratory:  Negative for cough and shortness of breath.   Cardiovascular:  Negative for chest pain and palpitations.  Gastrointestinal:  Positive for abdominal pain. Negative for nausea and vomiting.  Genitourinary:  Negative for dysuria and hematuria.  Musculoskeletal:  Negative for arthralgias and back pain.  Skin:  Negative for color change and rash.  Neurological:  Negative for seizures and syncope.  All other systems reviewed and are negative.   Physical Exam Updated Vital Signs BP (!) 189/110   Pulse (!) 125   Temp 97.7 F (36.5 C) (Oral)   Resp (!) 21   Ht 6\' 1"  (1.854 m)   Wt 108.9 kg   SpO2 94%   BMI 31.66 kg/m  Physical Exam Vitals and nursing note reviewed.  Constitutional:      General: He is not in acute distress.    Appearance: He is well-developed.  HENT:     Head: Normocephalic and atraumatic.  Eyes:     Conjunctiva/sclera: Conjunctivae normal.  Cardiovascular:     Rate and Rhythm:  Normal rate and regular rhythm.     Heart sounds: No murmur heard. Pulmonary:     Effort: Pulmonary effort is normal. No respiratory distress.     Breath sounds: Normal breath sounds.  Abdominal:     Palpations: Abdomen is soft.     Tenderness: There is abdominal tenderness.  Musculoskeletal:        General: No swelling.     Cervical back: Neck supple.  Skin:    General: Skin is warm and dry.     Capillary Refill: Capillary refill takes less than 2 seconds.  Neurological:     Mental Status: He is alert.  Psychiatric:        Mood and Affect: Mood normal.      ED Course/ Medical Decision Making/ A&P Clinical Course as of 09/06/22 2011  Sat Sep 06, 2022  1629 Patient endorsing 3 days of progressive abdominal pain. States he feels like it is his pancreas again. Successful at keeping fluids down. [CC]    Clinical Course User Index [CC] [name], MD    Procedures Procedures   Medications Ordered in ED Medications  lactated ringers infusion (has no administration in time range)  fentaNYL (SUBLIMAZE) injection 100 mcg (has no administration in time range)  ondansetron (ZOFRAN) injection 4 mg (has no administration in time range)  alum & mag hydroxide-simeth (MAALOX/MYLANTA) 200-200-20 MG/5ML suspension 30 mL (30 mLs Oral Given 09/06/22 1654)  And  lidocaine (XYLOCAINE) 2 % viscous mouth solution 15 mL (15 mLs Oral Given 09/06/22 1654)  ondansetron (ZOFRAN) injection 4 mg (4 mg Intravenous Given 09/06/22 1653)  lactated ringers bolus 1,000 mL (0 mLs Intravenous Stopped 09/06/22 1753)  oxyCODONE (Oxy IR/ROXICODONE) immediate release tablet 10 mg (10 mg Oral Given 09/06/22 1654)  acetaminophen (TYLENOL) tablet 1,000 mg (1,000 mg Oral Given 09/06/22 1654)  droperidol (INAPSINE) 2.5 MG/ML injection 2.5 mg (2.5 mg Intravenous Given 09/06/22 1828)  diphenhydrAMINE (BENADRYL) injection 25 mg (25 mg Intravenous Given 09/06/22 1828)   Medical Decision Making:   [name] is a 62 y.o.  male who presented to the ED today with abdominal pain, detailed above.    Patient's presentation is complicated by their history of multiple comorbid medical problems.  Patient placed on continuous vitals and telemetry monitoring while in ED which was reviewed periodically.  Complete initial physical exam performed, notably the patient  was HDS in NAD.     Reviewed and confirmed nursing documentation for past medical history, family history, social history.    Initial Assessment:   With the patient's presentation of abdominal pain, most likely diagnosis is nonspecific etiology versus chronic pancreatitis possible acute flare. Other diagnoses were considered including (but not limited to) gastroenteritis, colitis, small bowel obstruction, appendicitis, cholecystitis, pancreatitis, nephrolithiasis, UTI, pyleonephritis. These are considered less likely due to history of present illness and physical exam findings.   This is most consistent with an acute life/limb threatening illness complicated by underlying chronic conditions.   Initial Plan:  CBC/CMP to evaluate for underlying infectious/metabolic etiology for patient's abdominal pain  Lipase to evaluate for pancreatitis  EKG to evaluate for cardiac source of pain  Considered CTAB/Pelvis with contrast to evaluate for structural/surgical etiology of patients' severe abdominal pain, however patient has had multiple negative CT scans.  Discussed with the patient he was in agreement on withholding further ionizing radiation given consistency with prior presentation.  Urinalysis and repeat physical assessment to evaluate for UTI/Pyelonpehritis  Empiric management of symptoms with escalating pain control and antiemetics as needed.   Initial Study Results:   Laboratory  All laboratory results reviewed without evidence of clinically relevant pathology.      EKG EKG was reviewed independently. Rate, rhythm, axis, intervals all examined and without  medically relevant abnormality. ST segments without concerns for elevations.    Consults: Case discussed with hospitalist.   Final Reassessment and Plan:   Patient was in with severe epigastric pain laboratory findings most consistent with recurrent pancreatitis.  Patient has a history of alcoholic pancreatitis but is denying alcohol use at this time, likely idiopathic versus unspecified. Unlikely to be infectious or biliary based on other laboratory findings. Patient treated with antiemetics pain control emergency room discussed with hospitalist who agreed with need for admission.  Patient admitted with no further acute events.  Disposition:   Based on the above findings, I believe this patient is stable for admission.    Patient/family educated about specific findings on our evaluation and explained exact reasons for admission.  Patient/family educated about clinical situation and time was allowed to answer questions.   Admission team communicated with and agreed with need for admission. Patient admitted. Patient ready to move at this time.     Emergency Department Medication Summary:   Medications  lactated ringers infusion (has no administration in time range)  fentaNYL (SUBLIMAZE) injection 100 mcg (has no administration in time range)  ondansetron (ZOFRAN) injection 4 mg (has no administration in  time range)  alum & mag hydroxide-simeth (MAALOX/MYLANTA) 200-200-20 MG/5ML suspension 30 mL (30 mLs Oral Given 09/06/22 1654)    And  lidocaine (XYLOCAINE) 2 % viscous mouth solution 15 mL (15 mLs Oral Given 09/06/22 1654)  ondansetron (ZOFRAN) injection 4 mg (4 mg Intravenous Given 09/06/22 1653)  lactated ringers bolus 1,000 mL (0 mLs Intravenous Stopped 09/06/22 1753)  oxyCODONE (Oxy IR/ROXICODONE) immediate release tablet 10 mg (10 mg Oral Given 09/06/22 1654)  acetaminophen (TYLENOL) tablet 1,000 mg (1,000 mg Oral Given 09/06/22 1654)  droperidol (INAPSINE) 2.5 MG/ML injection 2.5 mg (2.5 mg  Intravenous Given 09/06/22 1828)  diphenhydrAMINE (BENADRYL) injection 25 mg (25 mg Intravenous Given 09/06/22 1828)             Clinical Impression: No diagnosis found.   Data Unavailable   Final Clinical Impression(s) / ED Diagnoses Final diagnoses:  None    Rx / DC Orders ED Discharge Orders     None         [name], MD 09/06/22 2232
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
## 2022-09-06 NOTE — Assessment & Plan Note (Signed)
Mild, check magnesium Likely due to poor PO intake Replete and trend

## 2022-09-06 NOTE — Assessment & Plan Note (Addendum)
A1C was 5.9 in May of 2024  Much better control.  Hold metformin while inpatient  Will do SSI while NPO and start back his long acting insulin when tolerating PO intake  Took his long acting insulin today

## 2022-09-07 ENCOUNTER — Observation Stay (HOSPITAL_COMMUNITY): Payer: 59

## 2022-09-07 ENCOUNTER — Inpatient Hospital Stay (HOSPITAL_COMMUNITY): Payer: 59

## 2022-09-07 DIAGNOSIS — Z79890 Hormone replacement therapy (postmenopausal): Secondary | ICD-10-CM | POA: Diagnosis not present

## 2022-09-07 DIAGNOSIS — E876 Hypokalemia: Secondary | ICD-10-CM | POA: Diagnosis present

## 2022-09-07 DIAGNOSIS — Z807 Family history of other malignant neoplasms of lymphoid, hematopoietic and related tissues: Secondary | ICD-10-CM | POA: Diagnosis not present

## 2022-09-07 DIAGNOSIS — Z794 Long term (current) use of insulin: Secondary | ICD-10-CM | POA: Diagnosis not present

## 2022-09-07 DIAGNOSIS — R188 Other ascites: Secondary | ICD-10-CM | POA: Diagnosis not present

## 2022-09-07 DIAGNOSIS — F1021 Alcohol dependence, in remission: Secondary | ICD-10-CM | POA: Diagnosis present

## 2022-09-07 DIAGNOSIS — F17210 Nicotine dependence, cigarettes, uncomplicated: Secondary | ICD-10-CM | POA: Diagnosis present

## 2022-09-07 DIAGNOSIS — Z885 Allergy status to narcotic agent status: Secondary | ICD-10-CM | POA: Diagnosis not present

## 2022-09-07 DIAGNOSIS — R109 Unspecified abdominal pain: Secondary | ICD-10-CM | POA: Diagnosis not present

## 2022-09-07 DIAGNOSIS — G8929 Other chronic pain: Secondary | ICD-10-CM | POA: Diagnosis present

## 2022-09-07 DIAGNOSIS — E785 Hyperlipidemia, unspecified: Secondary | ICD-10-CM | POA: Diagnosis present

## 2022-09-07 DIAGNOSIS — E1142 Type 2 diabetes mellitus with diabetic polyneuropathy: Secondary | ICD-10-CM | POA: Diagnosis present

## 2022-09-07 DIAGNOSIS — E669 Obesity, unspecified: Secondary | ICD-10-CM | POA: Diagnosis present

## 2022-09-07 DIAGNOSIS — Z79899 Other long term (current) drug therapy: Secondary | ICD-10-CM | POA: Diagnosis not present

## 2022-09-07 DIAGNOSIS — Z86711 Personal history of pulmonary embolism: Secondary | ICD-10-CM | POA: Diagnosis not present

## 2022-09-07 DIAGNOSIS — Z86718 Personal history of other venous thrombosis and embolism: Secondary | ICD-10-CM | POA: Diagnosis not present

## 2022-09-07 DIAGNOSIS — K4020 Bilateral inguinal hernia, without obstruction or gangrene, not specified as recurrent: Secondary | ICD-10-CM | POA: Diagnosis not present

## 2022-09-07 DIAGNOSIS — Z7984 Long term (current) use of oral hypoglycemic drugs: Secondary | ICD-10-CM | POA: Diagnosis not present

## 2022-09-07 DIAGNOSIS — R599 Enlarged lymph nodes, unspecified: Secondary | ICD-10-CM | POA: Diagnosis not present

## 2022-09-07 DIAGNOSIS — K859 Acute pancreatitis without necrosis or infection, unspecified: Secondary | ICD-10-CM | POA: Diagnosis not present

## 2022-09-07 DIAGNOSIS — R1084 Generalized abdominal pain: Secondary | ICD-10-CM | POA: Diagnosis present

## 2022-09-07 DIAGNOSIS — I10 Essential (primary) hypertension: Secondary | ICD-10-CM | POA: Diagnosis present

## 2022-09-07 DIAGNOSIS — G609 Hereditary and idiopathic neuropathy, unspecified: Secondary | ICD-10-CM | POA: Diagnosis present

## 2022-09-07 DIAGNOSIS — K861 Other chronic pancreatitis: Secondary | ICD-10-CM | POA: Diagnosis not present

## 2022-09-07 DIAGNOSIS — K824 Cholesterolosis of gallbladder: Secondary | ICD-10-CM | POA: Diagnosis not present

## 2022-09-07 DIAGNOSIS — Z6831 Body mass index (BMI) 31.0-31.9, adult: Secondary | ICD-10-CM | POA: Diagnosis not present

## 2022-09-07 LAB — LIPASE, BLOOD: Lipase: 535 U/L — ABNORMAL HIGH (ref 11–51)

## 2022-09-07 LAB — GLUCOSE, CAPILLARY
Glucose-Capillary: 139 mg/dL — ABNORMAL HIGH (ref 70–99)
Glucose-Capillary: 174 mg/dL — ABNORMAL HIGH (ref 70–99)

## 2022-09-07 LAB — CBC
HCT: 36.1 % — ABNORMAL LOW (ref 39.0–52.0)
Hemoglobin: 12.4 g/dL — ABNORMAL LOW (ref 13.0–17.0)
MCH: 34.4 pg — ABNORMAL HIGH (ref 26.0–34.0)
MCHC: 34.3 g/dL (ref 30.0–36.0)
MCV: 100.3 fL — ABNORMAL HIGH (ref 80.0–100.0)
Platelets: 159 10*3/uL (ref 150–400)
RBC: 3.6 MIL/uL — ABNORMAL LOW (ref 4.22–5.81)
RDW: 12.1 % (ref 11.5–15.5)
WBC: 11.5 10*3/uL — ABNORMAL HIGH (ref 4.0–10.5)
nRBC: 0 % (ref 0.0–0.2)

## 2022-09-07 LAB — COMPREHENSIVE METABOLIC PANEL
ALT: 21 U/L (ref 0–44)
AST: 23 U/L (ref 15–41)
Albumin: 3.7 g/dL (ref 3.5–5.0)
Alkaline Phosphatase: 73 U/L (ref 38–126)
Anion gap: 11 (ref 5–15)
BUN: 10 mg/dL (ref 6–20)
CO2: 22 mmol/L (ref 22–32)
Calcium: 8.2 mg/dL — ABNORMAL LOW (ref 8.9–10.3)
Chloride: 102 mmol/L (ref 98–111)
Creatinine, Ser: 0.78 mg/dL (ref 0.61–1.24)
GFR, Estimated: >60 mL/min (ref >60)
Glucose, Bld: 162 mg/dL — ABNORMAL HIGH (ref 70–99)
Potassium: 3.4 mmol/L — ABNORMAL LOW (ref 3.5–5.1)
Sodium: 135 mmol/L (ref 135–145)
Total Bilirubin: 1.3 mg/dL — ABNORMAL HIGH (ref 0.3–1.2)
Total Protein: 6.6 g/dL (ref 6.5–8.1)

## 2022-09-07 MED ORDER — HYDROMORPHONE HCL 1 MG/ML IJ SOLN
0.5000 mg | INTRAMUSCULAR | Status: DC | PRN
  Administered 2022-09-07 (×4): 1 mg via INTRAVENOUS
  Filled 2022-09-07 (×4): qty 1

## 2022-09-07 MED ORDER — HYDROMORPHONE HCL 1 MG/ML IJ SOLN
0.5000 mg | INTRAMUSCULAR | Status: DC | PRN
  Administered 2022-09-07 – 2022-09-08 (×5): 1 mg via INTRAVENOUS
  Filled 2022-09-07 (×5): qty 1

## 2022-09-07 MED ORDER — KETOROLAC TROMETHAMINE 30 MG/ML IJ SOLN
30.0000 mg | Freq: Four times a day (QID) | INTRAMUSCULAR | Status: AC
  Administered 2022-09-07 (×3): 30 mg via INTRAVENOUS
  Filled 2022-09-07 (×3): qty 1

## 2022-09-07 MED ORDER — IOHEXOL 9 MG/ML PO SOLN
500.0000 mL | ORAL | Status: AC
  Administered 2022-09-07 (×2): 500 mL via ORAL

## 2022-09-07 MED ORDER — SODIUM CHLORIDE (PF) 0.9 % IJ SOLN
INTRAMUSCULAR | Status: AC
  Filled 2022-09-07: qty 50

## 2022-09-07 MED ORDER — IOHEXOL 300 MG/ML  SOLN
100.0000 mL | Freq: Once | INTRAMUSCULAR | Status: AC | PRN
  Administered 2022-09-07: 100 mL via INTRAVENOUS

## 2022-09-07 MED ORDER — POTASSIUM CHLORIDE 10 MEQ/100ML IV SOLN
10.0000 meq | INTRAVENOUS | Status: AC
  Administered 2022-09-07 (×2): 10 meq via INTRAVENOUS
  Filled 2022-09-07: qty 100

## 2022-09-07 MED ORDER — LOSARTAN POTASSIUM 50 MG PO TABS
50.0000 mg | ORAL_TABLET | Freq: Every day | ORAL | Status: DC
  Administered 2022-09-07 – 2022-09-10 (×4): 50 mg via ORAL
  Filled 2022-09-07 (×4): qty 1

## 2022-09-07 MED ORDER — MAGNESIUM SULFATE 2 GM/50ML IV SOLN
2.0000 g | Freq: Once | INTRAVENOUS | Status: AC
  Administered 2022-09-07: 2 g via INTRAVENOUS
  Filled 2022-09-07: qty 50

## 2022-09-07 MED ORDER — IOHEXOL 9 MG/ML PO SOLN
ORAL | Status: AC
  Filled 2022-09-07: qty 1000

## 2022-09-07 NOTE — Progress Notes (Signed)
Progress Note   Patient: Bryce Mendoza ZOX:[id number] DOB: December 04, 1960 DOA: 09/06/2022     0 DOS: the patient was seen and examined on 09/07/2022   Brief hospital course: No notes on file  Assessment and Plan:  Acute on chronic pancreatitis (HCC) Hx of AUD 62 year old male presenting with 2 day history or worsening abdominal pain radiating to his back, nausea found to have acute on chronic pancreatitis. Lipase elevated to 700s with clinical syndrome of pancreatitis. Initially held off on CT, obtained due to unclear cause of his symptoms. Flare could be idiopathic, but will broaden work up with [place] to rule out gallstone pancreatitis, triglycerides, and PeTH send out. Reports no alcohol use for the past 2 years.  -pain control with scheduled toradol and IV dilaudid q2h -continue IVF -NPO, wants to try CLD -[place] pending  -consider GI consult for further evaluation   Hypokalemia Hypomagnesemia -replete mag and K   Leukocytosis Likely reactive to pancreatitis, hold on antibiotics currently.  -CBC  Benign essential HTN -Continue losartan  DM type 2 (diabetes mellitus, type 2) (HCC) A1C was 5.9 in May of 2024. Hold metformin while inpatient. -Will do SSI while NPO and start back his long acting insulin when tolerating PO intake   Idiopathic polyneuropathy EMG done at [facility] 2022 and were wnl, but still suspected he had some neuropathy  -On no medication s     Subjective: Still having significant pain but feels like his pancreatitis pain. Reports has not had any alcohol for the past 2 years. Still has his gallbladder. Is not sure what triggered his flare.   Physical Exam: Vitals:   09/07/22 0610 09/07/22 1043 09/07/22 1253 09/07/22 1502  BP: (!) 152/90 (!) 163/94 (!) 153/86 (!) 147/95  Pulse: 66 67 61 72  Resp: 18 18 20 16   Temp: 97.9 F (36.6 C) 97.8 F (36.6 C) 97.7 F (36.5 C) 98.3 F (36.8 C)  TempSrc: Oral Oral Oral Oral  SpO2: 92% 97% 97% 96%  Weight:      Height:        Physical Exam Vitals and nursing note reviewed.  Constitutional:      General: He is not in acute distress.    Appearance: He is not diaphoretic.  HENT:     Head: Atraumatic.  Eyes:     Pupils: Pupils are equal, round, and reactive to light.  Cardiovascular:     Rate and Rhythm: Normal rate and regular rhythm.     Pulses: Normal pulses.     Heart sounds: No murmur heard. Pulmonary:     Effort: Pulmonary effort is normal. No respiratory distress.     Breath sounds: Normal breath sounds. No rales.  Abdominal:     General: Abdomen is flat. There is no distension.     Palpations: Abdomen is soft.     Tenderness: There is generalized abdominal tenderness. There is no rebound. Negative signs include Murphy's sign.  Musculoskeletal:     Right lower leg: No edema.     Left lower leg: No edema.  Skin:    General: Skin is warm and dry.     Capillary Refill: Capillary refill takes less than 2 seconds.  Neurological:     Mental Status: He is alert and oriented to person, place, and time. Mental status is at baseline.  Psychiatric:        Mood and Affect: Mood normal.     Data Reviewed:     Latest Ref Rng & Units  09/07/2022    5:15 AM 09/06/2022    4:51 PM 08/06/2022   10:28 AM  CBC  WBC 4.0 - 10.5 K/uL 11.5  13.4  8.4   Hemoglobin 13.0 - 17.0 g/dL 16.1  09.6  04.5   Hematocrit 39.0 - 52.0 % 36.1  38.0  37.1   Platelets 150 - 400 K/uL 159  193  175       Latest Ref Rng & Units 09/07/2022    5:15 AM 09/06/2022    4:51 PM 08/06/2022   10:28 AM  BMP  Glucose 70 - 99 mg/dL 409  811  914   BUN 6 - 20 mg/dL 10  12  17    Creatinine 0.61 - 1.24 mg/dL 7.82  9.56  2.13   Sodium 135 - 145 mmol/L 135  135  135   Potassium 3.5 - 5.1 mmol/L 3.4  3.4  3.9   Chloride 98 - 111 mmol/L 102  104  99   CO2 22 - 32 mmol/L 22  19  21    Calcium 8.9 - 10.3 mg/dL 8.2  8.4  9.5    CT chest abd pelvis: 1. Diffuse fat stranding and inflammatory fluid about the pancreas and adjacent retroperitoneum,  consistent with acute pancreatitis. 2. Unchanged pseudocysts along the ventral pancreatic head, pancreatic body, and tail. No new acute pancreatic fluid collection. 3. Hepatic steatosis. 4. Small volume ascites in the low pelvis.    Family Communication: none at bedside  Disposition: Status is: Inpatient Remains inpatient appropriate because: iv medications and fluids  Planned Discharge Destination: Home    Time spent: 35 minutes  Author: Charolotte Eke, MD 09/07/2022 3:06 PM  For on call review [email or web address].

## 2022-09-08 DIAGNOSIS — K861 Other chronic pancreatitis: Secondary | ICD-10-CM | POA: Diagnosis not present

## 2022-09-08 DIAGNOSIS — K859 Acute pancreatitis without necrosis or infection, unspecified: Secondary | ICD-10-CM | POA: Diagnosis not present

## 2022-09-08 LAB — CBC
HCT: 34.8 % — ABNORMAL LOW (ref 39.0–52.0)
Hemoglobin: 12 g/dL — ABNORMAL LOW (ref 13.0–17.0)
MCH: 34.6 pg — ABNORMAL HIGH (ref 26.0–34.0)
MCHC: 34.5 g/dL (ref 30.0–36.0)
MCV: 100.3 fL — ABNORMAL HIGH (ref 80.0–100.0)
Platelets: 146 10*3/uL — ABNORMAL LOW (ref 150–400)
RBC: 3.47 MIL/uL — ABNORMAL LOW (ref 4.22–5.81)
RDW: 12.2 % (ref 11.5–15.5)
WBC: 10.5 10*3/uL (ref 4.0–10.5)
nRBC: 0 % (ref 0.0–0.2)

## 2022-09-08 LAB — COMPREHENSIVE METABOLIC PANEL
ALT: 15 U/L (ref 0–44)
AST: 18 U/L (ref 15–41)
Albumin: 3.4 g/dL — ABNORMAL LOW (ref 3.5–5.0)
Alkaline Phosphatase: 68 U/L (ref 38–126)
Anion gap: 10 (ref 5–15)
BUN: 7 mg/dL (ref 6–20)
CO2: 22 mmol/L (ref 22–32)
Calcium: 8.1 mg/dL — ABNORMAL LOW (ref 8.9–10.3)
Chloride: 100 mmol/L (ref 98–111)
Creatinine, Ser: 0.79 mg/dL (ref 0.61–1.24)
GFR, Estimated: >60 mL/min (ref >60)
Glucose, Bld: 131 mg/dL — ABNORMAL HIGH (ref 70–99)
Potassium: 3.4 mmol/L — ABNORMAL LOW (ref 3.5–5.1)
Sodium: 132 mmol/L — ABNORMAL LOW (ref 135–145)
Total Bilirubin: 1 mg/dL (ref 0.3–1.2)
Total Protein: 6.4 g/dL — ABNORMAL LOW (ref 6.5–8.1)

## 2022-09-08 LAB — GLUCOSE, CAPILLARY
Glucose-Capillary: 149 mg/dL — ABNORMAL HIGH (ref 70–99)
Glucose-Capillary: 152 mg/dL — ABNORMAL HIGH (ref 70–99)
Glucose-Capillary: 185 mg/dL — ABNORMAL HIGH (ref 70–99)
Glucose-Capillary: 181 mg/dL — ABNORMAL HIGH (ref 70–99)

## 2022-09-08 LAB — LIPASE, BLOOD: Lipase: 179 U/L — ABNORMAL HIGH (ref 11–51)

## 2022-09-08 LAB — MAGNESIUM: Magnesium: 2 mg/dL (ref 1.7–2.4)

## 2022-09-08 LAB — TRIGLYCERIDES: Triglycerides: 121 mg/dL (ref <150)

## 2022-09-08 MED ORDER — IBUPROFEN 400 MG PO TABS
400.0000 mg | ORAL_TABLET | Freq: Four times a day (QID) | ORAL | Status: DC | PRN
  Administered 2022-09-09: 400 mg via ORAL
  Filled 2022-09-08: qty 1

## 2022-09-08 MED ORDER — TRAZODONE HCL 100 MG PO TABS
100.0000 mg | ORAL_TABLET | Freq: Every day | ORAL | Status: DC
  Administered 2022-09-08 – 2022-09-09 (×2): 100 mg via ORAL
  Filled 2022-09-08 (×2): qty 1

## 2022-09-08 MED ORDER — PANTOPRAZOLE SODIUM 40 MG PO TBEC
40.0000 mg | DELAYED_RELEASE_TABLET | Freq: Every day | ORAL | Status: DC
  Administered 2022-09-08 – 2022-09-10 (×3): 40 mg via ORAL
  Filled 2022-09-08 (×3): qty 1

## 2022-09-08 MED ORDER — ENOXAPARIN SODIUM 60 MG/0.6ML IJ SOSY
55.0000 mg | PREFILLED_SYRINGE | INTRAMUSCULAR | Status: DC
  Administered 2022-09-08: 55 mg via SUBCUTANEOUS
  Filled 2022-09-08 (×2): qty 0.6

## 2022-09-08 MED ORDER — ONDANSETRON HCL 4 MG/2ML IJ SOLN: 4.0000 mg | Freq: Four times a day (QID) | INTRAMUSCULAR | Status: DC | PRN

## 2022-09-08 MED ORDER — ONDANSETRON HCL 4 MG/2ML IJ SOLN: 4.0000 mg | Freq: Four times a day (QID) | INTRAMUSCULAR | Status: DC

## 2022-09-08 MED ORDER — HYDROMORPHONE HCL 1 MG/ML IJ SOLN
0.5000 mg | INTRAMUSCULAR | Status: DC | PRN
  Administered 2022-09-08 – 2022-09-09 (×8): 1 mg via INTRAVENOUS
  Filled 2022-09-08 (×8): qty 1

## 2022-09-08 MED ORDER — ONDANSETRON HCL 4 MG/2ML IJ SOLN
4.0000 mg | Freq: Four times a day (QID) | INTRAMUSCULAR | Status: AC
  Administered 2022-09-08 – 2022-09-09 (×3): 4 mg via INTRAVENOUS
  Filled 2022-09-08 (×3): qty 2

## 2022-09-08 NOTE — Progress Notes (Signed)
HOSPITALIST ROUNDING NOTE Bryce Mendoza ZOX:[id number]  DOB: 1960-06-21  DOA: 09/06/2022  PCP: Claiborne Rigg, NP  Code Status: Full code  from: Home  current Dispo: Eventual home    09/08/2022, 7:25 AM    LOS: 58 day   62 year old home dwelling male DVT 2019+ PE--prior lung empyema with lung decortication for empyema previously--severe polyneuropathy previously DM TY 2 since 2022 Pancreatitis since 09/07/2020-developed necrotizing pancreatitis/pseudocyst and discharged 10/09/2020 was on prolonged antibiotics Sober X 2 years since 2022 Has had extensive workup to rule out autoimmune pancreatitis previously  Brought in by EMS 7/624 abdominal pain nausea and dry heaving no vomiting generalized discomfort distention Bicarb 19 BUN/creatinine 12/0.9--lipase 732 LFTs otherwise normal Magnesium 1.4 WBC 13 platelet 193 predominant neutrophilia-MCV 97  CT ABD 09/07/2022 diffuse fat stranding inflammatory fluid around pancreas/retroperitoneum = acute pancreatitis-unchanged pseudocyst ventral pancreatic head body tail no new collection-hepatic steatosis-small volume ascites Rx in ED IV fluid fentanyl Maalox Benadryl droperidol etc. 7/7 US abdomen pelvis = no evidence acute cholecystitis gallbladder polyp 7 mm?  Repeat ultrasound 6 months    Plan  Acute pancreatitis flare--triglycerides only 121 Gallbladder polyp was inconsequential and I doubt has anything to do with his pancreatitis Continue current Dilaudid 0.5-1 mg every 3 as needed as he is saying that the pain is a little better For breakthrough can use ibuprofen 400 every 6 as needed moderate pain Continue LR 50 cc/H as well as clear liquid diet-patient does not want to eat or graduate too quickly He does need to take Creon with all meals-he was taking this only intermittently with rich diet at home  DVT + PE 2019 Completed course of anticoagulation  DM TY 2--metformin held CBGs ranging 1 30-1 80 and although he is on insulin and metformin at home  his sugars are not elevated to the point that he needs to resume his insulin just yet Will observe trends and start sliding scale and treatment as he starts to eat  HTN Can continue Cozaar 50 daily-titrate meds if systolic pressure persistently above 160  Mild hypokalemia add K-Dur 40 check a.m. magnesium  Prior lung decortication-history of Severe polyneuropathy-not on meds   DVT prophylaxis: Lovenox  Status is: Inpatient Remains inpatient appropriate because:   Requires resolution of abdominal discomfort etc. may be able to discharge in several days    Subjective: Pain seems better-he is able to wait 3 to 4 hours before getting the next IV Dilaudid He lets me know that he is not taking his Creon regularly He asks about the gastric polyp and I told him that that is possibly of little consequence and he can follow-up in the outpatient for this Purposely is going slow on diet does not want to worsen symptoms We discussed starting ibuprofen  Objective + exam Vitals:   09/07/22 1758 09/07/22 2018 09/08/22 0033 09/08/22 0437  BP: (!) 162/92 130/88 (!) 148/88 127/86  Pulse: 71 67 72 75  Resp: 14 18 18 18   Temp: 98.3 F (36.8 C) 99.4 F (37.4 C) 98.6 F (37 C) 98.7 F (37.1 C)  TempSrc: Oral Oral Oral Oral  SpO2: 98% 98% 100% 99%  Weight:      Height:       Filed Weights   09/06/22 1611  Weight: 108.9 kg    Examination:  EOMI NCAT looks well no distress Chest is clear no wheeze no rales no rhonchi S1-S2 no murmur Abdomen is slightly tender in epigastrium somewhat distended no rebound no guarding No lower  extremity edema ROM is intact  Data Reviewed: reviewed   CBC    Component Value Date/Time   WBC 10.5 09/08/2022 0402   RBC 3.47 (L) 09/08/2022 0402   HGB 12.0 (L) 09/08/2022 0402   HGB 15.0 04/08/2022 1559   HCT 34.8 (L) 09/08/2022 0402   HCT 43.1 04/08/2022 1559   PLT 146 (L) 09/08/2022 0402   PLT 213 04/08/2022 1559   MCV 100.3 (H) 09/08/2022 0402    MCV 101 (H) 04/08/2022 1559   MCH 34.6 (H) 09/08/2022 0402   MCHC 34.5 09/08/2022 0402   RDW 12.2 09/08/2022 0402   RDW 12.2 04/08/2022 1559   LYMPHSABS 1.8 09/06/2022 1651   LYMPHSABS 3.6 (H) 04/08/2022 1559   MONOABS 0.8 09/06/2022 1651   EOSABS 0.1 09/06/2022 1651   EOSABS 0.1 04/08/2022 1559   BASOSABS 0.1 09/06/2022 1651   BASOSABS 0.1 04/08/2022 1559      Latest Ref Rng & Units 09/08/2022    4:02 AM 09/07/2022    5:15 AM 09/06/2022    4:51 PM  CMP  Glucose 70 - 99 mg/dL 409  811  914   BUN 6 - 20 mg/dL 7  10  12    Creatinine 0.61 - 1.24 mg/dL 7.82  9.56  2.13   Sodium 135 - 145 mmol/L 132  135  135   Potassium 3.5 - 5.1 mmol/L 3.4  3.4  3.4   Chloride 98 - 111 mmol/L 100  102  104   CO2 22 - 32 mmol/L 22  22  19    Calcium 8.9 - 10.3 mg/dL 8.1  8.2  8.4   Total Protein 6.5 - 8.1 g/dL 6.4  6.6  7.3   Total Bilirubin 0.3 - 1.2 mg/dL 1.0  1.3  1.2   Alkaline Phos 38 - 126 U/L 68  73  77   AST 15 - 41 U/L 18  23  26    ALT 0 - 44 U/L 15  21  26       Scheduled Meds:  enoxaparin (LOVENOX) injection  55 mg Subcutaneous Q24H   losartan  50 mg Oral Daily   ondansetron (ZOFRAN) IV  4 mg Intravenous Q6H   pantoprazole  40 mg Oral Daily   Continuous Infusions:  lactated ringers 100 mL/hr at 09/07/22 2320    Time 44  Rhetta Mura, MD  [facility]

## 2022-09-09 DIAGNOSIS — K859 Acute pancreatitis without necrosis or infection, unspecified: Secondary | ICD-10-CM | POA: Diagnosis not present

## 2022-09-09 DIAGNOSIS — K861 Other chronic pancreatitis: Secondary | ICD-10-CM | POA: Diagnosis not present

## 2022-09-09 LAB — COMPREHENSIVE METABOLIC PANEL
ALT: 16 U/L (ref 0–44)
AST: 22 U/L (ref 15–41)
Albumin: 3.6 g/dL (ref 3.5–5.0)
Alkaline Phosphatase: 71 U/L (ref 38–126)
Anion gap: 12 (ref 5–15)
BUN: 6 mg/dL (ref 6–20)
CO2: 24 mmol/L (ref 22–32)
Calcium: 8.8 mg/dL — ABNORMAL LOW (ref 8.9–10.3)
Chloride: 101 mmol/L (ref 98–111)
Creatinine, Ser: 0.77 mg/dL (ref 0.61–1.24)
GFR, Estimated: >60 mL/min (ref >60)
Glucose, Bld: 161 mg/dL — ABNORMAL HIGH (ref 70–99)
Potassium: 3.7 mmol/L (ref 3.5–5.1)
Sodium: 137 mmol/L (ref 135–145)
Total Bilirubin: 0.9 mg/dL (ref 0.3–1.2)
Total Protein: 7.3 g/dL (ref 6.5–8.1)

## 2022-09-09 LAB — GLUCOSE, CAPILLARY
Glucose-Capillary: 168 mg/dL — ABNORMAL HIGH (ref 70–99)
Glucose-Capillary: 208 mg/dL — ABNORMAL HIGH (ref 70–99)
Glucose-Capillary: 190 mg/dL — ABNORMAL HIGH (ref 70–99)
Glucose-Capillary: 236 mg/dL — ABNORMAL HIGH (ref 70–99)

## 2022-09-09 MED ORDER — INSULIN GLARGINE-YFGN 100 UNIT/ML ~~LOC~~ SOLN
7.0000 [IU] | Freq: Every day | SUBCUTANEOUS | Status: DC
  Administered 2022-09-09: 7 [IU] via SUBCUTANEOUS
  Filled 2022-09-09 (×2): qty 0.07

## 2022-09-09 MED ORDER — METFORMIN HCL ER 500 MG PO TB24
500.0000 mg | ORAL_TABLET | Freq: Every day | ORAL | Status: DC
  Administered 2022-09-10: 500 mg via ORAL
  Filled 2022-09-09: qty 1

## 2022-09-09 MED ORDER — OXYCODONE HCL 5 MG PO TABS
5.0000 mg | ORAL_TABLET | ORAL | Status: DC | PRN
  Administered 2022-09-09 – 2022-09-10 (×3): 5 mg via ORAL
  Filled 2022-09-09 (×3): qty 1

## 2022-09-09 MED ORDER — PANCRELIPASE (LIP-PROT-AMYL) 12000-38000 UNITS PO CPEP
24000.0000 [IU] | ORAL_CAPSULE | Freq: Three times a day (TID) | ORAL | Status: DC
  Administered 2022-09-10 (×2): 24000 [IU] via ORAL
  Filled 2022-09-09 (×2): qty 2

## 2022-09-09 MED ORDER — HYDROMORPHONE HCL 1 MG/ML IJ SOLN
0.5000 mg | INTRAMUSCULAR | Status: DC | PRN
  Administered 2022-09-09 – 2022-09-10 (×4): 0.5 mg via INTRAVENOUS
  Filled 2022-09-09 (×4): qty 0.5

## 2022-09-09 NOTE — Progress Notes (Signed)
HOSPITALIST ROUNDING NOTE Bryce Mendoza [id number]  DOB: Oct 23, 1960  DOA: 09/06/2022  PCP: Claiborne Rigg, NP  Code Status: Full code  from: Home  current Dispo: Eventual home    09/09/2022, 5:24 PM    LOS: 44 days   62 year old home dwelling male DVT 2019+ PE--prior lung empyema with lung decortication for empyema previously--severe polyneuropathy previously DM TY 2 since 2022 Pancreatitis since 09/07/2020-developed necrotizing pancreatitis/pseudocyst and discharged 10/09/2020 was on prolonged antibiotics Sober X 2 years since 2022 Has had extensive workup to rule out autoimmune pancreatitis previously  Brought in by EMS 7/624 abdominal pain nausea and dry heaving no vomiting generalized discomfort distention Bicarb 19 BUN/creatinine 12/0.9--lipase 732 LFTs otherwise normal Magnesium 1.4 WBC 13 platelet 193 predominant neutrophilia-MCV 97  CT ABD 09/07/2022 diffuse fat stranding inflammatory fluid around pancreas/retroperitoneum = acute pancreatitis-unchanged pseudocyst ventral pancreatic head body tail no new collection-hepatic steatosis-small volume ascites Rx in ED IV fluid fentanyl Maalox Benadryl droperidol etc. 7/7 US abdomen pelvis = no evidence acute cholecystitis gallbladder polyp 7 mm?  Repeat ultrasound 6 months    Plan  Acute pancreatitis flare--triglycerides only 121 Gallbladder polyp  inconsequential outpatient follow-up-I have started him on Creon and told him to be consistent with Saline lock IV Space out Dilaudid IV to 0.5 every 4-start oxycodone 5 every 4 as needed If eating more than 2 meals tomorrow can go home-(patient insistent on staying today)  DVT + PE 2019 Completed course of anticoagulation  DM TY 2 CBG trend 162 200 Resume metformin 500 XR in a.m., resume Lantus 7 units this evening-on discharge send him back on his 15 units  HTN Can continue Cozaar 50 daily-titrate meds if systolic pressure persistently above 160  Mild hypokalemia Replace and  resolved-periodic labs on the  Prior lung decortication-history of Severe polyneuropathy-not on meds   DVT prophylaxis: Lovenox  Status is: Inpatient Remains inpatient appropriate because:   Requires resolution of abdominal discomfort etc. may be able to discharge in several days    Subjective:  "I feel good doc but I need a day" Tolerated 1 meal today-no nausea-still feels a little distended-worried about going home and coming right back Counseled him that he needs to be on the Creon  Objective + exam Vitals:   09/08/22 1821 09/08/22 2049 09/09/22 0618 09/09/22 1309  BP: (!) 151/95 (!) 174/100 (!) 153/84 (!) 160/95  Pulse: 89 75 64 79  Resp: 16 18 18 14   Temp: 99.9 F (37.7 C) 98.5 F (36.9 C) 98.6 F (37 C) 98.6 F (37 C)  TempSrc: Oral Oral Oral Oral  SpO2: 95% 97% 97% 95%  Weight:      Height:       Filed Weights   09/06/22 1611  Weight: 108.9 kg    Examination:  Looks better than yesterday No distress Chest clear Abdomen distended no rebound no guarding however  Data Reviewed: reviewed   CBC    Component Value Date/Time   WBC 10.5 09/08/2022 0402   RBC 3.47 (L) 09/08/2022 0402   HGB 12.0 (L) 09/08/2022 0402   HGB 15.0 04/08/2022 1559   HCT 34.8 (L) 09/08/2022 0402   HCT 43.1 04/08/2022 1559   PLT 146 (L) 09/08/2022 0402   PLT 213 04/08/2022 1559   MCV 100.3 (H) 09/08/2022 0402   MCV 101 (H) 04/08/2022 1559   MCH 34.6 (H) 09/08/2022 0402   MCHC 34.5 09/08/2022 0402   RDW 12.2 09/08/2022 0402   RDW 12.2 04/08/2022 1559   LYMPHSABS 1.8  09/06/2022 1651   LYMPHSABS 3.6 (H) 04/08/2022 1559   MONOABS 0.8 09/06/2022 1651   EOSABS 0.1 09/06/2022 1651   EOSABS 0.1 04/08/2022 1559   BASOSABS 0.1 09/06/2022 1651   BASOSABS 0.1 04/08/2022 1559      Latest Ref Rng & Units 09/09/2022    4:48 AM 09/08/2022    4:02 AM 09/07/2022    5:15 AM  CMP  Glucose 70 - 99 mg/dL 161  096  045   BUN 6 - 20 mg/dL 6  7  10    Creatinine 0.61 - 1.24 mg/dL 4.09  8.11   9.14   Sodium 135 - 145 mmol/L 137  132  135   Potassium 3.5 - 5.1 mmol/L 3.7  3.4  3.4   Chloride 98 - 111 mmol/L 101  100  102   CO2 22 - 32 mmol/L 24  22  22    Calcium 8.9 - 10.3 mg/dL 8.8  8.1  8.2   Total Protein 6.5 - 8.1 g/dL 7.3  6.4  6.6   Total Bilirubin 0.3 - 1.2 mg/dL 0.9  1.0  1.3   Alkaline Phos 38 - 126 U/L 71  68  73   AST 15 - 41 U/L 22  18  23    ALT 0 - 44 U/L 16  15  21       Scheduled Meds:  enoxaparin (LOVENOX) injection  55 mg Subcutaneous Q24H   [START ON 09/10/2022] lipase/protease/amylase  24,000 Units Oral TID AC   losartan  50 mg Oral Daily   pantoprazole  40 mg Oral Daily   traZODone  100 mg Oral QHS   Continuous Infusions:  lactated ringers 50 mL/hr at 09/09/22 1633    Time 44  Rhetta Mura, MD  [facility]

## 2022-09-10 DIAGNOSIS — K861 Other chronic pancreatitis: Secondary | ICD-10-CM | POA: Diagnosis not present

## 2022-09-10 DIAGNOSIS — K859 Acute pancreatitis without necrosis or infection, unspecified: Secondary | ICD-10-CM | POA: Diagnosis not present

## 2022-09-10 LAB — GLUCOSE, CAPILLARY
Glucose-Capillary: 208 mg/dL — ABNORMAL HIGH (ref 70–99)
Glucose-Capillary: 250 mg/dL — ABNORMAL HIGH (ref 70–99)

## 2022-09-10 MED ORDER — OXYCODONE-ACETAMINOPHEN 10-325 MG PO TABS: 1.0000 | ORAL_TABLET | Freq: Four times a day (QID) | ORAL | 0 refills | Status: AC | PRN

## 2022-09-10 MED ORDER — PANCRELIPASE (LIP-PROT-AMYL) 24000-76000 UNITS PO CPEP: 24000.0000 [IU] | ORAL_CAPSULE | Freq: Three times a day (TID) | ORAL | 2 refills | Status: AC

## 2022-09-10 NOTE — Progress Notes (Signed)
   09/10/22 1315  TOC Brief Assessment  Insurance and Status Reviewed  Patient has primary care physician Yes  Home environment has been reviewed home with roomate  Prior level of function: independent  Prior/Current Home Services No current home services  Social Determinants of Health Reivew SDOH reviewed no interventions necessary  Readmission risk has been reviewed Yes  Transition of care needs no transition of care needs at this time

## 2022-09-10 NOTE — Discharge Summary (Signed)
Physician Discharge Summary  Bryce Mendoza WUJ:[id number] DOB: 05-31-1960 DOA: 09/06/2022  PCP: Claiborne Rigg, NP  Admit date: 09/06/2022 Discharge date: 09/10/2022  Admitted From: Home Discharge disposition: Home  Recommendations at discharge:  Judicious use of pain medicines   Brief narrative: Bryce Mendoza is a 62 y.o. male with PMH significant for DM2, DVT/PE, severe polyneuropathy, history of alcoholic pancreatitis. Patient had an episode of alcoholic pancreatitis in 2022 which progressed to necrosis and pseudocyst formation.  He states in the next 6 months, he will probably 10 episodes of pancreatitis. He had an extensive workup as an outpatient to rule out autoimmune pancreatitis.  He reports quitting alcohol since then.  He was seeing a pain management specialist at the time but did not need long-term pain management.  For the last 1 year, he did have any flareup of pancreatitis.  7/6, patient was brought to the ED by EMS from home with complaint of abdominal pain, nausea, generalized discomfort Labs showed lipase 732, LFTs normal, magnesium 1.4, triglyceride level was only 121  7/7, CT abdomen showed diffuse fat stranding inflammatory fluid around pancreas/retroperitoneum suggestive of acute pancreatitis.  Also some unchanged pseudocyst, hepatic steatosis, small volume ascites. 7/7, ultrasound showed a possible gallbladder polyp without any evidence of acute cholecystitis  Admitted to [facility] for acute pancreatitis  Subjective: Patient was seen and examined this morning. Patient was released lying down on bed.  Tolerated diet.  Pain controlled on oxycodone. Chart reviewed In the last 24 hours, afebrile, hemodynamically stable. Blood pressure running over 150s mostly Last set of blood work from yesterday morning mostly unremarkable. Blood glucose level running mostly elevated close to 200 Feels ready for discharge on pain meds.  Assessment and plan: Acute pancreatitis  flare H/o alcoholic pancreatitis 2022 Presented with abdominal pain, nausea, abdominal discomfort, elevated lipase, normal triglyceride level Reports abstinence from alcohol since 2022. CT abdomen with acute pancreatitis, stable pseudocyst Gradually improved with conservative management.  Lipase level trended down as below. Currently able to tolerate diet.  Had bowel movement last night. Feels ready for discharge today on oral oxycodone. Continue Creon 24,000 units 3 times daily as before. Continue PPI Recent Labs  Lab 09/06/22 1651 09/07/22 0515 09/08/22 0402  LIPASE 732* 535* 179*    Type 2 diabetes mellitus A1c 5.9 on 07/07/2122 PTA meds-Lantus 15 units daily, metformin 500 mg twice daily Resume the same Recent Labs  Lab 09/09/22 1121 09/09/22 1624 09/09/22 2153 09/10/22 0717 09/10/22 1134  GLUCAP 208* 190* 236* 208* 250*   Hypertension PTA meds-losartan 50 mg daily Continue same for now.  Hypokalemia/hypomagnesemia Improved with replacement Recent Labs  Lab 09/06/22 1651 09/06/22 2106 09/07/22 0515 09/08/22 0402 09/09/22 0448  K 3.4*  --  3.4* 3.4* 3.7  MG  --  1.4*  --  2.0  --    History of DVT/PE 2019 Completed course of anticoagulation   History of empyema S/p prior lung decortication  H/o severe polyneuropathy Reports he had an extensive workup done as an outpatient.  Not on meds Has an appointment with neurology next month   Goals of care   Code Status: Full Code   Wounds:  -    Discharge Exam:   Vitals:   09/09/22 1309 09/09/22 2014 09/10/22 0109 09/10/22 0626  BP: (!) 160/95 (!) 154/105 (!) 159/90 (!) 167/99  Pulse: 79 80 62 80  Resp: 14 18 18 18   Temp: 98.6 F (37 C) (!) 97.5 F (36.4 C) 98.3 F (36.8 C) 98.4  F (36.9 C)  TempSrc: Oral Oral Oral Oral  SpO2: 95% 100% 100% 100%  Weight:      Height:        Body mass index is 31.66 kg/m.  General exam: Middle-aged Caucasian male. Skin: No rashes, lesions or ulcers. HEENT:  Atraumatic, normocephalic, no obvious bleeding Lungs: Clear to auscultation bilaterally CVS: Regular rate and rhythm, no murmur GI/Abd soft, mild to moderate epigastric tenderness, bowel sound present CNS: Alert, awake, oriented x 3 Psychiatry: Mood appropriate Extremities: No pedal edema, no calf tenderness.  Follow ups:    Follow-up Information     Connect with your PCP/Specialist as discussed. Schedule an appointment as soon as possible for a visit .   Contact information: [email or web address] Call our physician referral line at [phone].        Claiborne Rigg, NP Follow up.   Specialty: Nurse Practitioner Contact information: [address] [place] [address] [phone]                 Discharge Instructions:   Discharge Instructions     Call MD for:  difficulty breathing, headache or visual disturbances   Complete by: As directed    Call MD for:  extreme fatigue   Complete by: As directed    Call MD for:  hives   Complete by: As directed    Call MD for:  persistant dizziness or light-headedness   Complete by: As directed    Call MD for:  persistant nausea and vomiting   Complete by: As directed    Call MD for:  severe uncontrolled pain   Complete by: As directed    Call MD for:  temperature >100.4   Complete by: As directed    Diet Carb Modified   Complete by: As directed    Diet general   Complete by: As directed    Discharge instructions   Complete by: As directed    Recommendations at discharge:   Judicious use of pain medicines  Discharge instructions for diabetes mellitus: Check blood sugar 3 times a day and bedtime at home. If blood sugar running above 200 or less than 70 please call your MD to adjust insulin. If you notice signs and symptoms of hypoglycemia (low blood sugar) like jitteriness, confusion, thirst, tremor and sweating, please check blood sugar, drink sugary drink/biscuits/sweets to  increase sugar level and call MD or return to ER.    General discharge instructions: Follow with Primary MD Claiborne Rigg, NP in 7 days  Please request your PCP  to go over your hospital tests, procedures, radiology results at the follow up. Please get your medicines reviewed and adjusted.  Your PCP may decide to repeat certain labs or tests as needed. Do not drive, operate heavy machinery, perform activities at heights, swimming or participation in water activities or provide baby sitting services if your were admitted for syncope or siezures until you have seen by Primary MD or a Neurologist and advised to do so again. [place] Controlled Substance Reporting System database was reviewed. Do not drive, operate heavy machinery, perform activities at heights, swim, participate in water activities or provide baby-sitting services while on medications for pain, sleep and mood until your outpatient physician has reevaluated you and advised to do so again.  You are strongly recommended to comply with the dose, frequency and duration of prescribed medications. Activity: As tolerated with Full fall precautions use walker/cane & assistance as needed Avoid using any recreational substances  like cigarette, tobacco, alcohol, or non-prescribed drug. If you experience worsening of your admission symptoms, develop shortness of breath, life threatening emergency, suicidal or homicidal thoughts you must seek medical attention immediately by calling 911 or calling your MD immediately  if symptoms less severe. You must read complete instructions/literature along with all the possible adverse reactions/side effects for all the medicines you take and that have been prescribed to you. Take any new medicine only after you have completely understood and accepted all the possible adverse reactions/side effects.  Wear Seat belts while driving. You were cared for by a hospitalist during your hospital stay. If you have  any questions about your discharge medications or the care you received while you were in the hospital after you are discharged, you can call the unit and ask to speak with the hospitalist or the covering physician. Once you are discharged, your primary care physician will handle any further medical issues. Please note that NO REFILLS for any discharge medications will be authorized once you are discharged, as it is imperative that you return to your primary care physician (or establish a relationship with a primary care physician if you do not have one).   Increase activity slowly   Complete by: As directed        Discharge Medications:   Allergies as of 09/10/2022       Reactions   Fentanyl Nausea And Vomiting   Morphine Other (See Comments)   Severe nausea        Medication List     TAKE these medications    B-D ULTRAFINE III SHORT PEN 31G X 8 MM Misc Generic drug: Insulin Pen Needle USE AS DIRECTED   dicyclomine 10 MG capsule Commonly known as: BENTYL Take 1 capsule (10 mg total) by mouth 3 (three) times daily before meals.   esomeprazole 40 MG capsule Commonly known as: NEXIUM Take 1 capsule (40 mg total) by mouth 2 (two) times daily before a meal.   losartan 50 MG tablet Commonly known as: COZAAR Take 1 tablet (50 mg total) by mouth daily.   Maalox Max 400-400-40 MG/5ML suspension Generic drug: alum & mag hydroxide-simeth Take 15 mLs by mouth every 6 (six) hours as needed for indigestion.   metFORMIN 500 MG 24 hr tablet Commonly known as: GLUCOPHAGE-XR Take 1 tablet (500 mg total) by mouth in the morning and at bedtime.   multivitamin tablet Take 1 tablet by mouth daily.   oxyCODONE-acetaminophen 10-325 MG tablet Commonly known as: Percocet Take 1 tablet by mouth every 6 (six) hours as needed for up to 5 days for pain.   Pancrelipase (Lip-Prot-Amyl) 24000-76000 units Cpep Take 1 capsule (24,000 Units total) by mouth 3 (three) times daily before meals.    Toujeo SoloStar 300 UNIT/ML Solostar Pen Generic drug: insulin glargine (1 Unit Dial) Inject 15 Units into the skin daily.         The results of significant diagnostics from this hospitalization (including imaging, microbiology, ancillary and laboratory) are listed below for reference.    Procedures and Diagnostic Studies:   US Abdomen Limited RUQ (LIVER/GB)  Result Date: 09/07/2022 CLINICAL DATA:  Abdominal pain EXAM: ULTRASOUND ABDOMEN LIMITED RIGHT UPPER QUADRANT COMPARISON:  Abdominal ultrasound dated September 15th 2022 FINDINGS: Gallbladder: No gallstones or wall thickening visualized. Gallbladder polyp measuring 7 mm, not visualized on prior ultrasound. No sonographic Murphy sign noted by sonographer. Common bile duct: Diameter: 4 mm Liver: No focal lesion identified. Within normal limits in parenchymal echogenicity. Portal  vein is patent on color Doppler imaging with normal direction of blood flow towards the liver. Other: Images suboptimal due to patient body habitus and bowel gas. IMPRESSION: 1. No evidence of acute cholecystitis. 2. Gallbladder polyp measuring 7 mm. Recommend follow-up ultrasound in 6 months. Electronically Signed   By: Allegra Lai M.D.   On: 09/07/2022 17:36   CT ABDOMEN PELVIS W CONTRAST  Result Date: 09/07/2022 CLINICAL DATA:  Pancreatitis, abdominal pain, nausea and vomiting since yesterday EXAM: CT ABDOMEN AND PELVIS WITH CONTRAST TECHNIQUE: Multidetector CT imaging of the abdomen and pelvis was performed using the standard protocol following bolus administration of intravenous contrast. RADIATION DOSE REDUCTION: This exam was performed according to the departmental dose-optimization program which includes automated exposure control, adjustment of the mA and/or kV according to patient size and/or use of iterative reconstruction technique. CONTRAST:  OMNIPAQUE IOHEXOL 300 MG/ML SOLN additional oral enteric contrast COMPARISON:  08/06/2022 FINDINGS: Lower  chest: Dependent bibasilar scarring or atelectasis. Hepatobiliary: No solid liver abnormality is seen. Hepatic steatosis no gallstones, gallbladder wall thickening, or biliary dilatation. Pancreas: Diffuse fat stranding and inflammatory fluid about the pancreas and adjacent retroperitoneum. Unchanged pseudocysts along the ventral pancreatic head, pancreatic body, and tail (series 2, image 34, 24). Spleen: Normal in size without significant abnormality. Adrenals/Urinary Tract: Adrenal glands are unremarkable. Kidneys are normal, without renal calculi, solid lesion, or hydronephrosis. Bladder is unremarkable. Stomach/Bowel: Stomach is within normal limits. Appendix appears normal. No evidence of bowel wall thickening, distention, or inflammatory changes. Vascular/Lymphatic: Aortic atherosclerosis. Unchanged enlarged portacaval lymph node measuring 2.4 x 1.9 cm (series 2, image 28). Reproductive: No mass or other significant abnormality. Other: Small, fat containing bilateral inguinal hernias. Small volume ascites in the low pelvis. Musculoskeletal: No acute or significant osseous findings. IMPRESSION: 1. Diffuse fat stranding and inflammatory fluid about the pancreas and adjacent retroperitoneum, consistent with acute pancreatitis. 2. Unchanged pseudocysts along the ventral pancreatic head, pancreatic body, and tail. No new acute pancreatic fluid collection. 3. Hepatic steatosis. 4. Small volume ascites in the low pelvis. Aortic Atherosclerosis (ICD10-I70.0). Electronically Signed   By: Jearld Lesch M.D.   On: 09/07/2022 14:21     Labs:   Basic Metabolic Panel: Recent Labs  Lab 09/06/22 1651 09/06/22 2106 09/07/22 0515 09/08/22 0402 09/09/22 0448  NA 135  --  135 132* 137  K 3.4*  --  3.4* 3.4* 3.7  CL 104  --  102 100 101  CO2 19*  --  22 22 24   GLUCOSE 179*  --  162* 131* 161*  BUN 12  --  10 7 6   CREATININE 0.97  --  0.78 0.79 0.77  CALCIUM 8.4*  --  8.2* 8.1* 8.8*  MG  --  1.4*  --  2.0  --     GFR Estimated Creatinine Clearance: 127.1 mL/min (by C-G formula based on SCr of 0.77 mg/dL). Liver Function Tests: Recent Labs  Lab 09/06/22 1651 09/07/22 0515 09/08/22 0402 09/09/22 0448  AST 26 23 18 22   ALT 26 21 15 16   ALKPHOS 77 73 68 71  BILITOT 1.2 1.3* 1.0 0.9  PROT 7.3 6.6 6.4* 7.3  ALBUMIN 4.1 3.7 3.4* 3.6   Recent Labs  Lab 09/06/22 1651 09/07/22 0515 09/08/22 0402  LIPASE 732* 535* 179*   No results for input(s): "AMMONIA" in the last 168 hours. Coagulation profile No results for input(s): "INR", "PROTIME" in the last 168 hours.  CBC: Recent Labs  Lab 09/06/22 1651 09/07/22 0515 09/08/22 0402  WBC 13.4* 11.5* 10.5  NEUTROABS 10.6*  --   --   HGB 13.4 12.4* 12.0*  HCT 38.0* 36.1* 34.8*  MCV 97.7 100.3* 100.3*  PLT 193 159 146*   Cardiac Enzymes: No results for input(s): "CKTOTAL", "CKMB", "CKMBINDEX", "TROPONINI" in the last 168 hours. BNP: Invalid input(s): "POCBNP" CBG: Recent Labs  Lab 09/09/22 1121 09/09/22 1624 09/09/22 2153 09/10/22 0717 09/10/22 1134  GLUCAP 208* 190* 236* 208* 250*   D-Dimer No results for input(s): "DDIMER" in the last 72 hours. Hgb A1c No results for input(s): "HGBA1C" in the last 72 hours. Lipid Profile Recent Labs    09/08/22 0402  TRIG 121   Thyroid function studies No results for input(s): "TSH", "T4TOTAL", "T3FREE", "THYROIDAB" in the last 72 hours.  Invalid input(s): "FREET3" Anemia work up No results for input(s): "VITAMINB12", "FOLATE", "FERRITIN", "TIBC", "IRON", "RETICCTPCT" in the last 72 hours. Microbiology No results found for this or any previous visit (from the past 240 hour(s)).  Time coordinating discharge: 45 minutes  Signed: Aleayah Chico  [facility] 09/10/2022, 12:49 PM

## 2022-09-10 NOTE — Progress Notes (Signed)
Mobility Specialist - Progress Note   09/10/22 0948  Mobility  Activity Ambulated with assistance in hallway  Level of Assistance Modified independent, requires aide device or extra time  Assistive Device Other (Comment) (IV Pole)  Distance Ambulated (ft) 500 ft  Activity Response Tolerated well  Mobility Referral Yes  $Mobility charge 1 Mobility  Mobility Specialist Start Time (ACUTE ONLY) [id number]  Mobility Specialist Stop Time (ACUTE ONLY) 0947  Mobility Specialist Time Calculation (min) (ACUTE ONLY) 10 min   Pt received in bed and agreeable to mobility. No complaints during session. Pt to bed after session with all needs met.    [facility]

## 2022-09-10 NOTE — Plan of Care (Signed)

## 2022-09-11 ENCOUNTER — Telehealth: Payer: Self-pay

## 2022-09-11 NOTE — Transitions of Care (Post Inpatient/ED Visit) (Signed)
09/11/2022  Name: Bryce Mendoza MRN: [id number] DOB: 1960/03/15  Today's TOC FU Call Status: Today's TOC FU Call Status:: Successful TOC FU Call Competed TOC FU Call Complete Date: 09/11/22  Transition Care Management Follow-up Telephone Call Date of Discharge: 09/10/22 Discharge Facility: [facility]) Type of Discharge: Inpatient Admission Primary Inpatient Discharge Diagnosis:: "generalized abd pain" How have you been since you were released from the hospital?: Better (Pt voices he rested well last night. He had a protein shake for dinner. Blood sugars have been better controlled since returning home-states ranging in the 140s. Denie snay pain-has only taken one pain pill since coming home. LBM yest.) Any questions or concerns?: No  Items Reviewed: Did you receive and understand the discharge instructions provided?: Yes Medications obtained,verified, and reconciled?: Yes (Medications Reviewed) Any new allergies since your discharge?: No Dietary orders reviewed?: Yes Type of Diet Ordered:: low salt/heart healthy/carb modified Do you have support at home?: Yes People in Home: alone Name of Support/Comfort Primary Source: pt states he has a friend that helps him out as needed  Medications Reviewed Today: Medications Reviewed Today     Reviewed by Charlyn Minerva, RN (Registered Nurse) on 09/11/22 at 1052  Med List Status: <None>   Medication Order Taking? Sig Documenting Provider Last Dose Status Informant  alum & mag hydroxide-simeth (MAALOX MAX) 400-400-40 MG/5ML suspension [id number] Yes Take 15 mLs by mouth every 6 (six) hours as needed for indigestion. Kommor, Madison, MD Taking Active Self, Pharmacy Records  B-D ULTRAFINE III SHORT PEN 31G X 8 MM MISC [id number] Yes USE AS DIRECTED Claiborne Rigg, NP Taking Active Self, Pharmacy Records  dicyclomine (BENTYL) 10 MG capsule [id number] Yes Take 1 capsule (10 mg total) by mouth 3 (three) times daily before meals.   Patient taking differently: Take 10 mg by mouth 3 (three) times daily before meals. Pt states takes as needed   Dorcas Carrow, MD Taking Active Self, Pharmacy Records  esomeprazole (NEXIUM) 40 MG capsule [id number] Yes Take 1 capsule (40 mg total) by mouth 2 (two) times daily before a meal. Kommor, Madison, MD Taking Active Self, Pharmacy Records  insulin glargine, 1 Unit Dial, (TOUJEO SOLOSTAR) 300 UNIT/ML Solostar Pen [id number] Yes Inject 15 Units into the skin daily. Hoy Register, MD Taking Active Self, Pharmacy Records  lipase/protease/amylase 678 776 6489 units CPEP [id number] Yes Take 1 capsule (24,000 Units total) by mouth 3 (three) times daily before meals. Lorin Glass, MD Taking Active   losartan (COZAAR) 50 MG tablet [id number] Yes Take 1 tablet (50 mg total) by mouth daily. Claiborne Rigg, NP Taking Active Self, Pharmacy Records  metFORMIN (GLUCOPHAGE-XR) 500 MG 24 hr tablet [id number] Yes Take 1 tablet (500 mg total) by mouth in the morning and at bedtime. Claiborne Rigg, NP Taking Active Self, Pharmacy Records  Multiple Vitamin (MULTIVITAMIN) tablet [id number] Yes Take 1 tablet by mouth daily. [provider] Taking Active Self, Pharmacy Records  oxyCODONE-acetaminophen (PERCOCET) 10-325 MG tablet [id number] Yes Take 1 tablet by mouth every 6 (six) hours as needed for up to 5 days for pain. Lorin Glass, MD Taking Active             Home Care and Equipment/Supplies: Were Home Health Services Ordered?: NA Any new equipment or medical supplies ordered?: NA  Functional Questionnaire: Do you need assistance with bathing/showering or dressing?: No Do you need assistance with meal preparation?: No Do you need assistance with eating?: No Do you have difficulty maintaining continence: No Do you need  assistance with getting out of bed/getting out of a chair/moving?: No Do you have difficulty managing or taking your medications?: No  Follow up appointments reviewed: PCP  Follow-up appointment confirmed?: Yes Date of PCP follow-up appointment?: 10/01/22 (Care guide assisted with making appt-first available with any provider scheduled) Follow-up Provider: [facility] Follow-up appointment confirmed?: NA Do you need transportation to your follow-up appointment?: No (pt confirms his friend will take him to appt) Do you understand care options if your condition(s) worsen?: Yes-patient verbalized understanding  SDOH Interventions Today    Flowsheet Row Most Recent Value  SDOH Interventions   Transportation Interventions Intervention Not Indicated      TOC Interventions Today    Flowsheet Row Most Recent Value  TOC Interventions   TOC Interventions Discussed/Reviewed TOC Interventions Discussed, Arranged PCP follow up less than 12 days/Care Guide scheduled      Interventions Today    Flowsheet Row Most Recent Value  General Interventions   General Interventions Discussed/Reviewed General Interventions Discussed, Doctor Visits, Durable Medical Equipment (DME)  Doctor Visits Discussed/Reviewed Doctor Visits Discussed, PCP  Durable Medical Equipment (DME) Glucomoter  PCP/Specialist Visits Compliance with follow-up visit  Education Interventions   Education Provided Provided Education  Provided Verbal Education On Nutrition, When to see the doctor, Medication, Blood Sugar Monitoring, Other  Nutrition Interventions   Nutrition Discussed/Reviewed Nutrition Discussed, Adding fruits and vegetables, Increasing proteins, Decreasing fats, Decreasing salt, Decreasing sugar intake, Supplemental nutrition  Pharmacy Interventions   Pharmacy Dicussed/Reviewed Pharmacy Topics Discussed, Medications and their functions  Safety Interventions   Safety Discussed/Reviewed Safety Discussed       Alessandra Grout [facility] Care Management Care Management Community Coordinator Direct Phone: [phone] Toll Free:  [phone] Fax: [phone]

## 2022-09-16 LAB — MISC LABCORP TEST (SEND OUT): Labcorp test code: 791584

## 2022-09-22 DIAGNOSIS — H2513 Age-related nuclear cataract, bilateral: Secondary | ICD-10-CM | POA: Diagnosis not present

## 2022-09-22 DIAGNOSIS — H179 Unspecified corneal scar and opacity: Secondary | ICD-10-CM | POA: Diagnosis not present

## 2022-09-22 DIAGNOSIS — E119 Type 2 diabetes mellitus without complications: Secondary | ICD-10-CM | POA: Diagnosis not present

## 2022-09-22 DIAGNOSIS — H18603 Keratoconus, unspecified, bilateral: Secondary | ICD-10-CM | POA: Diagnosis not present

## 2022-09-25 ENCOUNTER — Ambulatory Visit: Payer: 59 | Admitting: Neurology

## 2022-09-27 ENCOUNTER — Other Ambulatory Visit: Payer: Self-pay | Admitting: Nurse Practitioner

## 2022-09-27 DIAGNOSIS — I10 Essential (primary) hypertension: Secondary | ICD-10-CM

## 2022-09-29 NOTE — Telephone Encounter (Signed)
Requested Prescriptions  Pending Prescriptions Disp Refills   losartan (COZAAR) 50 MG tablet [Pharmacy Med Name: LOSARTAN POTASSIUM 50 MG TAB] 90 tablet 0    Sig: TAKE 1 TABLET BY MOUTH EVERY DAY     Cardiovascular:  Angiotensin Receptor Blockers Failed - 09/27/2022  1:13 AM      Failed - Last BP in normal range    BP Readings from Last 1 Encounters:  09/10/22 (!) 167/99         Passed - Cr in normal range and within 180 days    Creatinine, Ser  Date Value Ref Range Status  09/09/2022 0.77 0.61 - 1.24 mg/dL Final         Passed - K in normal range and within 180 days    Potassium  Date Value Ref Range Status  09/09/2022 3.7 3.5 - 5.1 mmol/L Final         Passed - Patient is not pregnant      Passed - Valid encounter within last 6 months    Recent Outpatient Visits           5 months ago Type 2 diabetes mellitus with hyperglycemia, with long-term current use of insulin [facility])   [place] [facility] [place], Shea Stakes, NP   1 year ago Hospital discharge follow-up   [facility] [place], [place] W, NP   1 year ago Type 2 diabetes mellitus with hyperglycemia, unspecified whether long term insulin use [facility])   [place] [facility] [place], [place], [place]   1 year ago Elevated glucose   [facility] [place], Marzella Schlein, [place]   1 year ago Hospital discharge follow-up   [facility] Claiborne Rigg, NP       Future Appointments             In 2 days Anders Simmonds, PA-C [facility]

## 2022-10-01 ENCOUNTER — Inpatient Hospital Stay: Payer: 59 | Admitting: Physician Assistant

## 2022-11-01 ENCOUNTER — Other Ambulatory Visit: Payer: Self-pay | Admitting: Nurse Practitioner

## 2022-11-01 DIAGNOSIS — E1165 Type 2 diabetes mellitus with hyperglycemia: Secondary | ICD-10-CM

## 2022-11-04 NOTE — Telephone Encounter (Signed)
Called patient to schedule appt for medication refills. No answer, LVMTCB 

## 2022-11-04 NOTE — Telephone Encounter (Signed)
Requested by interface surescripts. Courtesy refill. No encounter in 7 months. Called patient to schedule appt for medication refills. No answer, LVMTCB.  Requested Prescriptions  Pending Prescriptions Disp Refills   metFORMIN (GLUCOPHAGE-XR) 500 MG 24 hr tablet [Pharmacy Med Name: METFORMIN HCL ER 500 MG TABLET] 180 tablet 0    Sig: TAKE 1 TABLET (500 MG TOTAL) BY MOUTH IN THE MORNING AND AT BEDTIME     Endocrinology:  Diabetes - Biguanides Failed - 11/01/2022  1:23 AM      Failed - B12 Level in normal range and within 720 days    Vitamin B-12  Date Value Ref Range Status  06/15/2019 457 180 - 914 pg/mL Final    Comment:    (NOTE) This assay is not validated for testing neonatal or myeloproliferative syndrome specimens for Vitamin B12 levels. Performed at [facility], [address] [place], [place] [address]          Failed - Valid encounter within last 6 months    Recent Outpatient Visits           7 months ago Type 2 diabetes mellitus with hyperglycemia, with long-term current use of insulin (HCC)   [facility] [place] [place], [place], [place]   1 year ago Hospital discharge follow-up   [facility] Claiborne Rigg, NP   1 year ago Type 2 diabetes mellitus with hyperglycemia, unspecified whether long term insulin use [facility])   [facility] [place], [place], [place]   1 year ago Elevated glucose   [facility] [place], Marylene Land M, [place]   2 years ago Hospital discharge follow-up   [facility] [place], Shea Stakes, NP              Passed - Cr in normal range and within 360 days    Creatinine, Ser  Date Value Ref Range Status  09/09/2022 0.77 0.61 - 1.24 mg/dL Final         Passed - HBA1C is between 0 and 7.9 and within 180 days    Hemoglobin A1C  Date Value Ref Range Status  07/07/2022 5.9  Final          Passed - eGFR in normal range and within 360 days    GFR calc Af Amer  Date Value Ref Range Status  08/17/2019 >60 >60 mL/min Final   GFR, Estimated  Date Value Ref Range Status  09/09/2022 >60 >60 mL/min Final    Comment:    (NOTE) Calculated using the CKD-EPI Creatinine Equation (2021)    eGFR  Date Value Ref Range Status  04/08/2022 68 >59 mL/min/1.73 Final         Passed - CBC within normal limits and completed in the last 12 months    WBC  Date Value Ref Range Status  09/08/2022 10.5 4.0 - 10.5 K/uL Final   RBC  Date Value Ref Range Status  09/08/2022 3.47 (L) 4.22 - 5.81 MIL/uL Final   Hemoglobin  Date Value Ref Range Status  09/08/2022 12.0 (L) 13.0 - 17.0 g/dL Final  42/59/5638 75.6 13.0 - 17.7 g/dL Final   HCT  Date Value Ref Range Status  09/08/2022 34.8 (L) 39.0 - 52.0 % Final   Hematocrit  Date Value Ref Range Status  04/08/2022 43.1 37.5 - 51.0 % Final   MCHC  Date Value Ref Range  Status  09/08/2022 34.5 30.0 - 36.0 g/dL Final   [facility]  Date Value Ref Range Status  09/08/2022 34.6 (H) 26.0 - 34.0 pg Final   MCV  Date Value Ref Range Status  09/08/2022 100.3 (H) 80.0 - 100.0 fL Final  04/08/2022 101 (H) 79 - 97 fL Final   No results found for: "PLTCOUNTKUC", "LABPLAT", "POCPLA" RDW  Date Value Ref Range Status  09/08/2022 12.2 11.5 - 15.5 % Final  04/08/2022 12.2 11.6 - 15.4 % Final

## 2022-11-25 ENCOUNTER — Ambulatory Visit: Payer: 59 | Admitting: Neurology

## 2022-12-15 ENCOUNTER — Ambulatory Visit: Payer: 59 | Attending: Nurse Practitioner | Admitting: Nurse Practitioner

## 2022-12-15 ENCOUNTER — Encounter: Payer: Self-pay | Admitting: Nurse Practitioner

## 2022-12-15 VITALS — BP 156/102 | HR 104 | Ht 73.0 in | Wt 256.2 lb

## 2022-12-15 DIAGNOSIS — Z0000 Encounter for general adult medical examination without abnormal findings: Secondary | ICD-10-CM | POA: Diagnosis not present

## 2022-12-15 DIAGNOSIS — I10 Essential (primary) hypertension: Secondary | ICD-10-CM | POA: Diagnosis not present

## 2022-12-15 DIAGNOSIS — Z794 Long term (current) use of insulin: Secondary | ICD-10-CM | POA: Diagnosis not present

## 2022-12-15 DIAGNOSIS — E1165 Type 2 diabetes mellitus with hyperglycemia: Secondary | ICD-10-CM | POA: Diagnosis not present

## 2022-12-15 MED ORDER — BLOOD PRESSURE MONITOR DEVI: 0 refills | Status: AC
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
## 2022-12-15 NOTE — Patient Instructions (Signed)
[facility] for your blood pressure machine [phone]
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
## 2022-12-24 ENCOUNTER — Ambulatory Visit: Payer: 59 | Attending: Nurse Practitioner | Admitting: Pharmacist

## 2022-12-24 ENCOUNTER — Encounter: Payer: Self-pay | Admitting: Pharmacist

## 2022-12-24 DIAGNOSIS — E785 Hyperlipidemia, unspecified: Secondary | ICD-10-CM

## 2022-12-24 DIAGNOSIS — Z7985 Long-term (current) use of injectable non-insulin antidiabetic drugs: Secondary | ICD-10-CM

## 2022-12-24 DIAGNOSIS — Z794 Long term (current) use of insulin: Secondary | ICD-10-CM

## 2022-12-24 DIAGNOSIS — E1165 Type 2 diabetes mellitus with hyperglycemia: Secondary | ICD-10-CM

## 2022-12-24 DIAGNOSIS — Z7984 Long term (current) use of oral hypoglycemic drugs: Secondary | ICD-10-CM

## 2022-12-24 MED ORDER — ROSUVASTATIN CALCIUM 10 MG PO TABS: 10.0000 mg | ORAL_TABLET | Freq: Every day | ORAL | 1 refills | Status: DC

## 2022-12-24 MED ORDER — BD PEN NEEDLE SHORT U/F 31G X 8 MM MISC: 6 refills | Status: DC

## 2022-12-24 NOTE — Progress Notes (Signed)
   12/24/2022 Name: Bryce Mendoza MRN: [id number] DOB: February 18, 1961  No chief complaint on file.   Bryce Mendoza is a 62 y.o. year old male who presented for a telephone visit.   They were referred to the pharmacist by a quality report for assistance in managing hyperlipidemia. He is one of our SUPD patients. PMH is significant for type 2 DM but he is not currently on a statin. He is amenable to adding rosuvastatin today.   Subjective:  Care Team: Primary Care Provider: Claiborne Rigg, NP ; Next Scheduled Visit: none currently scheduled. Last seen 12/15/22.  Medication Access/Adherence  Current Pharmacy:  [facility] [phone] - Closed [place], [place] - [address] [place] [address] Phone: [phone] Fax: [phone]  [place] [facility] [address] [place] [address] Phone: [phone] Fax: [phone]  [facility] - [place], [place] - [address] [place] [address] Phone: [phone] Fax: [phone]   Patient reports affordability concerns with their medications: No  Patient reports access/transportation concerns to their pharmacy: No  Patient reports adherence concerns with their medications:  No     Hyperlipidemia/ASCVD Risk Reduction  Current lipid lowering medications:  Medications tried in the past: atorvastatin. Denies any side effects or issues with the medication.   Objective:  Lab Results  Component Value Date   HGBA1C 5.9 07/07/2022    Lab Results  Component Value Date   CREATININE 0.77 09/09/2022   BUN 6 09/09/2022   NA 137 09/09/2022   K 3.7 09/09/2022   CL 101 09/09/2022   CO2 24 09/09/2022    Lab Results  Component Value Date   CHOL 194 04/08/2022   HDL 68 04/08/2022   LDLCALC 110 (H) 04/08/2022   TRIG 121 09/08/2022   CHOLHDL 2.9 04/08/2022    Medications Reviewed Today   Medications were not reviewed in this  encounter       Assessment/Plan:   Hyperlipidemia/ASCVD Risk Reduction: - Currently uncontrolled. Goal LDL would be <70 mg/dL in the setting of primary prevention with an ASCVD risk score >20%.  - Reviewed long term complications of uncontrolled cholesterol combined with type 2 DM and patient is amenable to starting rosuvastatin. While his ASCVD risk score is >20%, we will start with moderate intensity and see how he tolerates this. We have a good chance to get to goal from his baseline LDL. - Start rosuvastatin 10 mg once daily.   Follow Up Plan: with PCP.   Butch Penny, PharmD, [place]y [place], CPP Clinical Pharmacist [facility] [phone]

## 2023-01-12 ENCOUNTER — Ambulatory Visit: Payer: Self-pay

## 2023-01-12 ENCOUNTER — Other Ambulatory Visit: Payer: Self-pay | Admitting: Nurse Practitioner

## 2023-01-12 DIAGNOSIS — I10 Essential (primary) hypertension: Secondary | ICD-10-CM

## 2023-01-12 MED ORDER — LOSARTAN POTASSIUM 100 MG PO TABS: 100.0000 mg | ORAL_TABLET | Freq: Every day | ORAL | 1 refills | Status: DC

## 2023-01-12 NOTE — Telephone Encounter (Signed)
he patient would like to speak with a member of staff when possible regarding their BP and possibly increasing their prescription of losartan (COZAAR) 50 MG tablet [[id number]]   The patient shares that an increase was discussed with their PCP previously and they would like to continue discussions related to the prescription   Please contact when possible   Left message to call back.

## 2023-01-12 NOTE — Telephone Encounter (Signed)
Increase losartan to 100 mg

## 2023-01-12 NOTE — Telephone Encounter (Signed)
Spoke with patient. Patient voiced that he was advised by he's PCP on his last OV to keep a record of his BP reading and enter them in  my chart. Patient voiced that he did not know how to do this. I reviewed with the patient how to send his PCP his BP reading. Patient voiced that he understood, But asked if I could just let them the BP readings . Voiced that if there are charges made in his medication would we please send at to his pharmacy.   Please see ranges of  BP below:  01/12/2023   142/92 01/09/2023   144/92 01/07/2023   138/90 01/05/2023   138/94  Please advise.

## 2023-01-12 NOTE — Telephone Encounter (Signed)
Message from [place] sent at 01/12/2023  3:43 PM EST  Summary: bp concern / rx req   The patient would like to speak with a member of staff when possible regarding their BP and possibly increasing their prescription of losartan (COZAAR) 50 MG tablet [[id number]]  The patient shares that an increase was discussed with their PCP previously and they would like to continue discussions related to the prescription  Please contact when possible         Chief Complaint: Need rx refilled and possible change in dose of losartan Symptoms: none BPs's 140/80's  Pertinent Negatives: Patient denies sx Disposition: [] ED /[] Urgent Care (no appt availability in office) / [] Appointment(In office/virtual)/ []  [place] [facility]/ [] Home Care/ [] Refused Recommended Disposition /[] [place] [facility]/ []  Follow-up with PCP Additional Notes: has 5 pills left needs refill  Reason for Disposition  [1] Systolic BP  >= 130 OR Diastolic >= 80 AND [2] taking BP medications  Answer Assessment - Initial Assessment Questions 1. BLOOD PRESSURE: "What is the blood pressure?" "Did you take at least two measurements 5 minutes apart?"     140/90's 2. ONSET: "When did you take your blood pressure?"     Several days 3. HOW: "How did you take your blood pressure?" (e.g., automatic home BP monitor, visiting nurse)     Auto home cuff 4. HISTORY: "Do you have a history of high blood pressure?"     yes 5. MEDICINES: "Are you taking any medicines for blood pressure?"      yes 6. OTHER SYMPTOMS: "Do you have any symptoms?" (e.g., blurred vision, chest pain, difficulty breathing, headache, weakness)     no  Protocols used: Blood Pressure - High-A-AH

## 2023-01-13 NOTE — Telephone Encounter (Signed)
Spoke with patient . Patient advise per PCP "Increase losartan to 100 mg" .

## 2023-01-18 ENCOUNTER — Encounter (HOSPITAL_COMMUNITY): Payer: Self-pay | Admitting: Emergency Medicine

## 2023-01-18 ENCOUNTER — Other Ambulatory Visit: Payer: Self-pay

## 2023-01-18 ENCOUNTER — Inpatient Hospital Stay (HOSPITAL_COMMUNITY)
Admission: EM | Admit: 2023-01-18 | Discharge: 2023-01-23 | DRG: 439 | Disposition: A | Discharge status: Home / Self Care | Payer: 59 | Attending: Internal Medicine | Admitting: Internal Medicine

## 2023-01-18 DIAGNOSIS — F17210 Nicotine dependence, cigarettes, uncomplicated: Secondary | ICD-10-CM | POA: Diagnosis present

## 2023-01-18 DIAGNOSIS — N179 Acute kidney failure, unspecified: Secondary | ICD-10-CM | POA: Diagnosis present

## 2023-01-18 DIAGNOSIS — Z79899 Other long term (current) drug therapy: Secondary | ICD-10-CM | POA: Diagnosis not present

## 2023-01-18 DIAGNOSIS — Z885 Allergy status to narcotic agent status: Secondary | ICD-10-CM | POA: Diagnosis not present

## 2023-01-18 DIAGNOSIS — R6510 Systemic inflammatory response syndrome (SIRS) of non-infectious origin without acute organ dysfunction: Secondary | ICD-10-CM | POA: Diagnosis not present

## 2023-01-18 DIAGNOSIS — K047 Periapical abscess without sinus: Secondary | ICD-10-CM | POA: Diagnosis not present

## 2023-01-18 DIAGNOSIS — Z86711 Personal history of pulmonary embolism: Secondary | ICD-10-CM | POA: Diagnosis present

## 2023-01-18 DIAGNOSIS — E1110 Type 2 diabetes mellitus with ketoacidosis without coma: Secondary | ICD-10-CM | POA: Diagnosis not present

## 2023-01-18 DIAGNOSIS — Z794 Long term (current) use of insulin: Secondary | ICD-10-CM | POA: Diagnosis not present

## 2023-01-18 DIAGNOSIS — E876 Hypokalemia: Secondary | ICD-10-CM | POA: Diagnosis present

## 2023-01-18 DIAGNOSIS — K8500 Idiopathic acute pancreatitis without necrosis or infection: Secondary | ICD-10-CM | POA: Diagnosis not present

## 2023-01-18 DIAGNOSIS — R739 Hyperglycemia, unspecified: Secondary | ICD-10-CM | POA: Diagnosis not present

## 2023-01-18 DIAGNOSIS — I10 Essential (primary) hypertension: Secondary | ICD-10-CM | POA: Diagnosis not present

## 2023-01-18 DIAGNOSIS — Z7984 Long term (current) use of oral hypoglycemic drugs: Secondary | ICD-10-CM | POA: Diagnosis not present

## 2023-01-18 DIAGNOSIS — E119 Type 2 diabetes mellitus without complications: Secondary | ICD-10-CM | POA: Diagnosis not present

## 2023-01-18 DIAGNOSIS — E8721 Acute metabolic acidosis: Secondary | ICD-10-CM | POA: Diagnosis not present

## 2023-01-18 DIAGNOSIS — E669 Obesity, unspecified: Secondary | ICD-10-CM | POA: Insufficient documentation

## 2023-01-18 DIAGNOSIS — E871 Hypo-osmolality and hyponatremia: Secondary | ICD-10-CM | POA: Insufficient documentation

## 2023-01-18 DIAGNOSIS — K8689 Other specified diseases of pancreas: Secondary | ICD-10-CM | POA: Diagnosis not present

## 2023-01-18 DIAGNOSIS — R1013 Epigastric pain: Secondary | ICD-10-CM | POA: Diagnosis not present

## 2023-01-18 DIAGNOSIS — R109 Unspecified abdominal pain: Secondary | ICD-10-CM | POA: Diagnosis not present

## 2023-01-18 DIAGNOSIS — Z6834 Body mass index (BMI) 34.0-34.9, adult: Secondary | ICD-10-CM

## 2023-01-18 DIAGNOSIS — E860 Dehydration: Secondary | ICD-10-CM | POA: Diagnosis present

## 2023-01-18 DIAGNOSIS — E1310 Other specified diabetes mellitus with ketoacidosis without coma: Principal | ICD-10-CM

## 2023-01-18 DIAGNOSIS — K859 Acute pancreatitis without necrosis or infection, unspecified: Secondary | ICD-10-CM | POA: Diagnosis not present

## 2023-01-18 DIAGNOSIS — E1165 Type 2 diabetes mellitus with hyperglycemia: Secondary | ICD-10-CM | POA: Diagnosis not present

## 2023-01-18 DIAGNOSIS — F1021 Alcohol dependence, in remission: Secondary | ICD-10-CM | POA: Diagnosis present

## 2023-01-18 DIAGNOSIS — R6889 Other general symptoms and signs: Secondary | ICD-10-CM | POA: Diagnosis not present

## 2023-01-18 DIAGNOSIS — R112 Nausea with vomiting, unspecified: Secondary | ICD-10-CM | POA: Diagnosis not present

## 2023-01-18 HISTORY — DX: Type 2 diabetes mellitus without complications: E11.9

## 2023-01-18 LAB — CBC
HCT: 40 % (ref 39.0–52.0)
Hemoglobin: 14.2 g/dL (ref 13.0–17.0)
MCH: 35.1 pg — ABNORMAL HIGH (ref 26.0–34.0)
MCHC: 35.5 g/dL (ref 30.0–36.0)
MCV: 99 fL (ref 80.0–100.0)
Platelets: 280 10*3/uL (ref 150–400)
RBC: 4.04 MIL/uL — ABNORMAL LOW (ref 4.22–5.81)
RDW: 11.7 % (ref 11.5–15.5)
WBC: 18 10*3/uL — ABNORMAL HIGH (ref 4.0–10.5)
nRBC: 0 % (ref 0.0–0.2)

## 2023-01-18 LAB — URINALYSIS, ROUTINE W REFLEX MICROSCOPIC
Bacteria, UA: NONE SEEN
Bilirubin Urine: NEGATIVE
Glucose, UA: >=500 mg/dL — AB
Hgb urine dipstick: NEGATIVE
Ketones, ur: 5 mg/dL — AB
Leukocytes,Ua: NEGATIVE
Nitrite: NEGATIVE
Protein, ur: 30 mg/dL — AB
Specific Gravity, Urine: 1.029 (ref 1.005–1.030)
pH: 5 (ref 5.0–8.0)

## 2023-01-18 LAB — COMPREHENSIVE METABOLIC PANEL
ALT: 16 U/L (ref 0–44)
AST: 25 U/L (ref 15–41)
Albumin: 3.8 g/dL (ref 3.5–5.0)
Alkaline Phosphatase: 94 U/L (ref 38–126)
Anion gap: 14 (ref 5–15)
BUN: 23 mg/dL — ABNORMAL HIGH (ref 6–20)
CO2: 15 mmol/L — ABNORMAL LOW (ref 22–32)
Calcium: 12.5 mg/dL — ABNORMAL HIGH (ref 8.9–10.3)
Chloride: 103 mmol/L (ref 98–111)
Creatinine, Ser: 1.29 mg/dL — ABNORMAL HIGH (ref 0.61–1.24)
GFR, Estimated: >60 mL/min (ref >60)
Glucose, Bld: 406 mg/dL — ABNORMAL HIGH (ref 70–99)
Potassium: 4.4 mmol/L (ref 3.5–5.1)
Sodium: 132 mmol/L — ABNORMAL LOW (ref 135–145)
Total Bilirubin: 0.7 mg/dL (ref <1.2)
Total Protein: 7.5 g/dL (ref 6.5–8.1)

## 2023-01-18 LAB — LIPASE, BLOOD: Lipase: 449 U/L — ABNORMAL HIGH (ref 11–51)

## 2023-01-18 MED ORDER — HYDROMORPHONE HCL 1 MG/ML IJ SOLN
1.0000 mg | Freq: Once | INTRAMUSCULAR | Status: AC
  Administered 2023-01-18: 1 mg via INTRAVENOUS
  Filled 2023-01-18: qty 1

## 2023-01-18 MED ORDER — LACTATED RINGERS IV BOLUS
1000.0000 mL | Freq: Once | INTRAVENOUS | Status: AC
Start: 2023-01-18 — End: 2023-01-18
  Administered 2023-01-18: 1000 mL via INTRAVENOUS

## 2023-01-18 MED ORDER — OXYCODONE-ACETAMINOPHEN 5-325 MG PO TABS
1.0000 | ORAL_TABLET | Freq: Once | ORAL | Status: AC
  Administered 2023-01-18: 1 via ORAL
  Filled 2023-01-18: qty 1

## 2023-01-18 MED ORDER — ONDANSETRON HCL 4 MG/2ML IJ SOLN
4.0000 mg | Freq: Once | INTRAMUSCULAR | Status: AC
  Administered 2023-01-18: 4 mg via INTRAVENOUS
  Filled 2023-01-18: qty 2

## 2023-01-18 NOTE — ED Triage Notes (Signed)
Presents via EMS for abd pain x 1week, more sever today, generalized pain radiating into back, N/V x 3 days. Last BM 3 days ago. H/o pancreatitis.  EMS VS: 137/98, 80s HA, 98% AR  CBG was 402mg /dL with GFD.  Takes lantus, metformin

## 2023-01-19 ENCOUNTER — Inpatient Hospital Stay (HOSPITAL_COMMUNITY): Payer: 59

## 2023-01-19 ENCOUNTER — Encounter (HOSPITAL_COMMUNITY): Payer: Self-pay | Admitting: Internal Medicine

## 2023-01-19 DIAGNOSIS — Z7984 Long term (current) use of oral hypoglycemic drugs: Secondary | ICD-10-CM | POA: Diagnosis not present

## 2023-01-19 DIAGNOSIS — E871 Hypo-osmolality and hyponatremia: Secondary | ICD-10-CM | POA: Insufficient documentation

## 2023-01-19 DIAGNOSIS — K8020 Calculus of gallbladder without cholecystitis without obstruction: Secondary | ICD-10-CM | POA: Diagnosis not present

## 2023-01-19 DIAGNOSIS — R6510 Systemic inflammatory response syndrome (SIRS) of non-infectious origin without acute organ dysfunction: Secondary | ICD-10-CM | POA: Diagnosis not present

## 2023-01-19 DIAGNOSIS — Z885 Allergy status to narcotic agent status: Secondary | ICD-10-CM | POA: Diagnosis not present

## 2023-01-19 DIAGNOSIS — Z79899 Other long term (current) drug therapy: Secondary | ICD-10-CM | POA: Diagnosis not present

## 2023-01-19 DIAGNOSIS — R188 Other ascites: Secondary | ICD-10-CM | POA: Diagnosis not present

## 2023-01-19 DIAGNOSIS — R112 Nausea with vomiting, unspecified: Secondary | ICD-10-CM

## 2023-01-19 DIAGNOSIS — E860 Dehydration: Secondary | ICD-10-CM

## 2023-01-19 DIAGNOSIS — K760 Fatty (change of) liver, not elsewhere classified: Secondary | ICD-10-CM | POA: Diagnosis not present

## 2023-01-19 DIAGNOSIS — Z794 Long term (current) use of insulin: Secondary | ICD-10-CM

## 2023-01-19 DIAGNOSIS — R1013 Epigastric pain: Secondary | ICD-10-CM | POA: Diagnosis not present

## 2023-01-19 DIAGNOSIS — E876 Hypokalemia: Secondary | ICD-10-CM | POA: Diagnosis not present

## 2023-01-19 DIAGNOSIS — F17210 Nicotine dependence, cigarettes, uncomplicated: Secondary | ICD-10-CM | POA: Diagnosis present

## 2023-01-19 DIAGNOSIS — E669 Obesity, unspecified: Secondary | ICD-10-CM | POA: Diagnosis present

## 2023-01-19 DIAGNOSIS — N179 Acute kidney failure, unspecified: Secondary | ICD-10-CM | POA: Diagnosis not present

## 2023-01-19 DIAGNOSIS — K828 Other specified diseases of gallbladder: Secondary | ICD-10-CM | POA: Diagnosis not present

## 2023-01-19 DIAGNOSIS — K859 Acute pancreatitis without necrosis or infection, unspecified: Secondary | ICD-10-CM

## 2023-01-19 DIAGNOSIS — K429 Umbilical hernia without obstruction or gangrene: Secondary | ICD-10-CM | POA: Diagnosis not present

## 2023-01-19 DIAGNOSIS — E1165 Type 2 diabetes mellitus with hyperglycemia: Secondary | ICD-10-CM | POA: Diagnosis present

## 2023-01-19 DIAGNOSIS — K047 Periapical abscess without sinus: Secondary | ICD-10-CM | POA: Insufficient documentation

## 2023-01-19 DIAGNOSIS — Z6834 Body mass index (BMI) 34.0-34.9, adult: Secondary | ICD-10-CM | POA: Diagnosis not present

## 2023-01-19 DIAGNOSIS — Z86711 Personal history of pulmonary embolism: Secondary | ICD-10-CM | POA: Diagnosis not present

## 2023-01-19 DIAGNOSIS — K861 Other chronic pancreatitis: Secondary | ICD-10-CM | POA: Diagnosis not present

## 2023-01-19 DIAGNOSIS — K8689 Other specified diseases of pancreas: Secondary | ICD-10-CM | POA: Diagnosis present

## 2023-01-19 DIAGNOSIS — E119 Type 2 diabetes mellitus without complications: Secondary | ICD-10-CM | POA: Diagnosis not present

## 2023-01-19 DIAGNOSIS — K8500 Idiopathic acute pancreatitis without necrosis or infection: Secondary | ICD-10-CM | POA: Diagnosis not present

## 2023-01-19 DIAGNOSIS — I10 Essential (primary) hypertension: Secondary | ICD-10-CM | POA: Diagnosis not present

## 2023-01-19 DIAGNOSIS — E8721 Acute metabolic acidosis: Secondary | ICD-10-CM | POA: Diagnosis not present

## 2023-01-19 DIAGNOSIS — F1021 Alcohol dependence, in remission: Secondary | ICD-10-CM | POA: Diagnosis present

## 2023-01-19 LAB — COMPREHENSIVE METABOLIC PANEL
ALT: 16 U/L (ref 0–44)
AST: 20 U/L (ref 15–41)
Albumin: 3.2 g/dL — ABNORMAL LOW (ref 3.5–5.0)
Alkaline Phosphatase: 79 U/L (ref 38–126)
Anion gap: 10 (ref 5–15)
BUN: 18 mg/dL (ref 6–20)
CO2: 17 mmol/L — ABNORMAL LOW (ref 22–32)
Calcium: 11.3 mg/dL — ABNORMAL HIGH (ref 8.9–10.3)
Chloride: 106 mmol/L (ref 98–111)
Creatinine, Ser: 0.83 mg/dL (ref 0.61–1.24)
GFR, Estimated: >60 mL/min (ref >60)
Glucose, Bld: 167 mg/dL — ABNORMAL HIGH (ref 70–99)
Potassium: 3.3 mmol/L — ABNORMAL LOW (ref 3.5–5.1)
Sodium: 133 mmol/L — ABNORMAL LOW (ref 135–145)
Total Bilirubin: 0.7 mg/dL (ref <1.2)
Total Protein: 6.6 g/dL (ref 6.5–8.1)

## 2023-01-19 LAB — RAPID URINE DRUG SCREEN, HOSP PERFORMED
Amphetamines: NOT DETECTED
Barbiturates: NOT DETECTED
Benzodiazepines: NOT DETECTED
Cocaine: NOT DETECTED
Opiates: POSITIVE — AB
Tetrahydrocannabinol: NOT DETECTED

## 2023-01-19 LAB — BLOOD GAS, VENOUS
Acid-base deficit: 6.8 mmol/L — ABNORMAL HIGH (ref 0.0–2.0)
Bicarbonate: 19.7 mmol/L — ABNORMAL LOW (ref 20.0–28.0)
O2 Saturation: 34.4 %
Patient temperature: 37
pCO2, Ven: 42 mm[Hg] — ABNORMAL LOW (ref 44–60)
pH, Ven: 7.28 (ref 7.25–7.43)
pO2, Ven: <31 mm[Hg] — CL (ref 32–45)

## 2023-01-19 LAB — GLUCOSE, CAPILLARY
Glucose-Capillary: 226 mg/dL — ABNORMAL HIGH (ref 70–99)
Glucose-Capillary: 149 mg/dL — ABNORMAL HIGH (ref 70–99)
Glucose-Capillary: 138 mg/dL — ABNORMAL HIGH (ref 70–99)
Glucose-Capillary: 156 mg/dL — ABNORMAL HIGH (ref 70–99)
Glucose-Capillary: 176 mg/dL — ABNORMAL HIGH (ref 70–99)
Glucose-Capillary: 199 mg/dL — ABNORMAL HIGH (ref 70–99)
Glucose-Capillary: 188 mg/dL — ABNORMAL HIGH (ref 70–99)
Glucose-Capillary: 151 mg/dL — ABNORMAL HIGH (ref 70–99)

## 2023-01-19 LAB — CBC
HCT: 36.8 % — ABNORMAL LOW (ref 39.0–52.0)
Hemoglobin: 12.8 g/dL — ABNORMAL LOW (ref 13.0–17.0)
MCH: 34.9 pg — ABNORMAL HIGH (ref 26.0–34.0)
MCHC: 34.8 g/dL (ref 30.0–36.0)
MCV: 100.3 fL — ABNORMAL HIGH (ref 80.0–100.0)
Platelets: 253 10*3/uL (ref 150–400)
RBC: 3.67 MIL/uL — ABNORMAL LOW (ref 4.22–5.81)
RDW: 11.9 % (ref 11.5–15.5)
WBC: 17.7 10*3/uL — ABNORMAL HIGH (ref 4.0–10.5)
nRBC: 0 % (ref 0.0–0.2)

## 2023-01-19 LAB — BASIC METABOLIC PANEL
Anion gap: 10 (ref 5–15)
BUN: 25 mg/dL — ABNORMAL HIGH (ref 6–20)
CO2: 18 mmol/L — ABNORMAL LOW (ref 22–32)
Calcium: 11.7 mg/dL — ABNORMAL HIGH (ref 8.9–10.3)
Chloride: 103 mmol/L (ref 98–111)
Creatinine, Ser: 1.27 mg/dL — ABNORMAL HIGH (ref 0.61–1.24)
GFR, Estimated: >60 mL/min (ref >60)
Glucose, Bld: 381 mg/dL — ABNORMAL HIGH (ref 70–99)
Potassium: 3.9 mmol/L (ref 3.5–5.1)
Sodium: 131 mmol/L — ABNORMAL LOW (ref 135–145)

## 2023-01-19 LAB — HEMOGLOBIN A1C
Hgb A1c MFr Bld: 9.2 % — ABNORMAL HIGH (ref 4.8–5.6)
Mean Plasma Glucose: 217.34 mg/dL

## 2023-01-19 LAB — LIPASE, BLOOD: Lipase: 193 U/L — ABNORMAL HIGH (ref 11–51)

## 2023-01-19 LAB — SODIUM, URINE, RANDOM: Sodium, Ur: 64 mmol/L

## 2023-01-19 LAB — BETA-HYDROXYBUTYRIC ACID: Beta-Hydroxybutyric Acid: 0.78 mmol/L — ABNORMAL HIGH (ref 0.05–0.27)

## 2023-01-19 LAB — CBG MONITORING, ED
Glucose-Capillary: 355 mg/dL — ABNORMAL HIGH (ref 70–99)
Glucose-Capillary: 218 mg/dL — ABNORMAL HIGH (ref 70–99)

## 2023-01-19 LAB — ETHANOL: Alcohol, Ethyl (B): <10 mg/dL (ref <10)

## 2023-01-19 LAB — TRIGLYCERIDES: Triglycerides: 83 mg/dL (ref <150)

## 2023-01-19 LAB — MRSA NEXT GEN BY PCR, NASAL: MRSA by PCR Next Gen: DETECTED — AB

## 2023-01-19 LAB — PHOSPHORUS: Phosphorus: 2.9 mg/dL (ref 2.5–4.6)

## 2023-01-19 LAB — CK: Total CK: 34 U/L — ABNORMAL LOW (ref 49–397)

## 2023-01-19 LAB — MAGNESIUM: Magnesium: 1.6 mg/dL — ABNORMAL LOW (ref 1.7–2.4)

## 2023-01-19 LAB — CREATININE, URINE, RANDOM: Creatinine, Urine: 63 mg/dL

## 2023-01-19 MED ORDER — LORAZEPAM 2 MG/ML IJ SOLN: 0.5000 mg | Freq: Four times a day (QID) | INTRAMUSCULAR | Status: DC | PRN

## 2023-01-19 MED ORDER — POTASSIUM CHLORIDE CRYS ER 20 MEQ PO TBCR
40.0000 meq | EXTENDED_RELEASE_TABLET | Freq: Two times a day (BID) | ORAL | Status: AC
  Administered 2023-01-19 (×2): 40 meq via ORAL
  Filled 2023-01-19 (×2): qty 2

## 2023-01-19 MED ORDER — INSULIN GLARGINE-YFGN 100 UNIT/ML ~~LOC~~ SOLN
8.0000 [IU] | SUBCUTANEOUS | Status: DC
  Administered 2023-01-19 – 2023-01-23 (×5): 8 [IU] via SUBCUTANEOUS
  Filled 2023-01-19 (×5): qty 0.08

## 2023-01-19 MED ORDER — DIPHENHYDRAMINE HCL 50 MG/ML IJ SOLN
12.5000 mg | Freq: Four times a day (QID) | INTRAMUSCULAR | Status: DC | PRN
Start: 2023-01-19 — End: 2023-01-22
  Administered 2023-01-20 – 2023-01-21 (×2): 12.5 mg via INTRAVENOUS
  Filled 2023-01-19 (×2): qty 1

## 2023-01-19 MED ORDER — INSULIN ASPART 100 UNIT/ML IJ SOLN
0.0000 [IU] | Freq: Every day | INTRAMUSCULAR | Status: DC
  Administered 2023-01-22: 3 [IU] via SUBCUTANEOUS

## 2023-01-19 MED ORDER — ACETAMINOPHEN 650 MG RE SUPP: 650.0000 mg | Freq: Four times a day (QID) | RECTAL | Status: DC | PRN

## 2023-01-19 MED ORDER — LACTATED RINGERS IV SOLN: INTRAVENOUS | Status: DC

## 2023-01-19 MED ORDER — ONDANSETRON HCL 4 MG/2ML IJ SOLN
4.0000 mg | Freq: Four times a day (QID) | INTRAMUSCULAR | Status: DC | PRN
  Administered 2023-01-19 – 2023-01-22 (×3): 4 mg via INTRAVENOUS
  Filled 2023-01-19 (×3): qty 2

## 2023-01-19 MED ORDER — LACTATED RINGERS IV BOLUS
20.0000 mL/kg | Freq: Once | INTRAVENOUS | Status: AC
  Administered 2023-01-19: 2300 mL via INTRAVENOUS

## 2023-01-19 MED ORDER — IOHEXOL 9 MG/ML PO SOLN
500.0000 mL | ORAL | Status: AC
  Administered 2023-01-19 (×2): 500 mL via ORAL

## 2023-01-19 MED ORDER — LOSARTAN POTASSIUM 50 MG PO TABS
100.0000 mg | ORAL_TABLET | Freq: Every day | ORAL | Status: DC
  Administered 2023-01-19 – 2023-01-23 (×5): 100 mg via ORAL
  Filled 2023-01-19 (×5): qty 2

## 2023-01-19 MED ORDER — SODIUM CHLORIDE 0.45 % IV SOLN: INTRAVENOUS | Status: DC

## 2023-01-19 MED ORDER — ROSUVASTATIN CALCIUM 10 MG PO TABS
10.0000 mg | ORAL_TABLET | Freq: Every day | ORAL | Status: DC
  Administered 2023-01-19 – 2023-01-23 (×5): 10 mg via ORAL
  Filled 2023-01-19 (×5): qty 1

## 2023-01-19 MED ORDER — KCL IN DEXTROSE-NACL 10-5-0.45 MEQ/L-%-% IV SOLN: INTRAVENOUS | Status: DC

## 2023-01-19 MED ORDER — KCL IN DEXTROSE-NACL 10-5-0.45 MEQ/L-%-% IV SOLN
INTRAVENOUS | Status: DC
  Filled 2023-01-19: qty 1000

## 2023-01-19 MED ORDER — CHLORHEXIDINE GLUCONATE CLOTH 2 % EX PADS
6.0000 | MEDICATED_PAD | Freq: Every day | CUTANEOUS | Status: DC
  Administered 2023-01-20: 6 via TOPICAL

## 2023-01-19 MED ORDER — MELATONIN 3 MG PO TABS
3.0000 mg | ORAL_TABLET | Freq: Every evening | ORAL | Status: DC | PRN
  Administered 2023-01-22: 3 mg via ORAL
  Filled 2023-01-19: qty 1

## 2023-01-19 MED ORDER — AMOXICILLIN 500 MG PO CAPS
500.0000 mg | ORAL_CAPSULE | Freq: Three times a day (TID) | ORAL | Status: DC
  Administered 2023-01-19 – 2023-01-23 (×12): 500 mg via ORAL
  Filled 2023-01-19 (×13): qty 1

## 2023-01-19 MED ORDER — ENOXAPARIN SODIUM 40 MG/0.4ML IJ SOSY
40.0000 mg | PREFILLED_SYRINGE | INTRAMUSCULAR | Status: DC
Start: 2023-01-19 — End: 2023-01-23
  Administered 2023-01-19 – 2023-01-22 (×4): 40 mg via SUBCUTANEOUS
  Filled 2023-01-19 (×4): qty 0.4

## 2023-01-19 MED ORDER — INSULIN REGULAR(HUMAN) IN NACL 100-0.9 UT/100ML-% IV SOLN
INTRAVENOUS | Status: DC
  Administered 2023-01-19: 10.5 [IU]/h via INTRAVENOUS
  Filled 2023-01-19: qty 100

## 2023-01-19 MED ORDER — MAGNESIUM SULFATE 2 GM/50ML IV SOLN
2.0000 g | Freq: Once | INTRAVENOUS | Status: AC
  Administered 2023-01-19: 2 g via INTRAVENOUS
  Filled 2023-01-19: qty 50

## 2023-01-19 MED ORDER — SODIUM CHLORIDE 0.9% FLUSH
9.0000 mL | INTRAVENOUS | Status: DC | PRN
Start: 2023-01-19 — End: 2023-01-22

## 2023-01-19 MED ORDER — OXYCODONE-ACETAMINOPHEN 5-325 MG PO TABS: 1.0000 | ORAL_TABLET | Freq: Four times a day (QID) | ORAL | Status: DC | PRN

## 2023-01-19 MED ORDER — ACETAMINOPHEN 325 MG PO TABS: 650.0000 mg | ORAL_TABLET | Freq: Four times a day (QID) | ORAL | Status: DC | PRN

## 2023-01-19 MED ORDER — NALOXONE HCL 0.4 MG/ML IJ SOLN: 0.4000 mg | INTRAMUSCULAR | Status: DC | PRN

## 2023-01-19 MED ORDER — DEXTROSE 50 % IV SOLN: 0.0000 mL | INTRAVENOUS | Status: DC | PRN

## 2023-01-19 MED ORDER — ONDANSETRON HCL 4 MG/2ML IJ SOLN: 4.0000 mg | Freq: Four times a day (QID) | INTRAMUSCULAR | Status: DC | PRN

## 2023-01-19 MED ORDER — DROPERIDOL 2.5 MG/ML IJ SOLN
1.2500 mg | Freq: Once | INTRAMUSCULAR | Status: AC
  Administered 2023-01-19: 1.25 mg via INTRAVENOUS
  Filled 2023-01-19: qty 2

## 2023-01-19 MED ORDER — DEXTROSE IN LACTATED RINGERS 5 % IV SOLN: INTRAVENOUS | Status: DC

## 2023-01-19 MED ORDER — DIPHENHYDRAMINE HCL 12.5 MG/5ML PO ELIX: 12.5000 mg | ORAL_SOLUTION | Freq: Four times a day (QID) | ORAL | Status: DC | PRN

## 2023-01-19 MED ORDER — LACTATED RINGERS IV SOLN: INTRAVENOUS | Status: AC

## 2023-01-19 MED ORDER — HYDROMORPHONE HCL 1 MG/ML IJ SOLN
1.0000 mg | INTRAMUSCULAR | Status: DC | PRN
  Administered 2023-01-19 (×4): 1 mg via INTRAVENOUS
  Filled 2023-01-19 (×4): qty 1

## 2023-01-19 MED ORDER — HYDROMORPHONE 1 MG/ML IV SOLN
INTRAVENOUS | Status: DC
  Administered 2023-01-19: 30 mg via INTRAVENOUS
  Administered 2023-01-19: 3 mg via INTRAVENOUS
  Administered 2023-01-20: 2.4 mg via INTRAVENOUS
  Administered 2023-01-20: 0.3 mg via INTRAVENOUS
  Administered 2023-01-20: 1.8 mg via INTRAVENOUS
  Administered 2023-01-21: 1.2 mg via INTRAVENOUS
  Administered 2023-01-21: 30 mg via INTRAVENOUS
  Administered 2023-01-22: 1.2 mg via INTRAVENOUS
  Filled 2023-01-19 (×2): qty 30

## 2023-01-19 MED ORDER — INSULIN ASPART 100 UNIT/ML IJ SOLN
0.0000 [IU] | Freq: Three times a day (TID) | INTRAMUSCULAR | Status: DC
  Administered 2023-01-19 – 2023-01-22 (×5): 2 [IU] via SUBCUTANEOUS
  Administered 2023-01-22: 3 [IU] via SUBCUTANEOUS
  Administered 2023-01-23: 2 [IU] via SUBCUTANEOUS

## 2023-01-19 MED ORDER — IOHEXOL 300 MG/ML  SOLN
100.0000 mL | Freq: Once | INTRAMUSCULAR | Status: AC | PRN
  Administered 2023-01-19: 100 mL via INTRAVENOUS

## 2023-01-19 NOTE — ED Provider Notes (Incomplete)
[place] EMERGENCY DEPARTMENT AT [facility] Provider Note   CSN: [id number] Arrival date & time: 01/18/23  2046     History {Add pertinent medical, surgical, social history, OB history to HPI:1} Chief Complaint  Patient presents with  . Abdominal Pain    Bryce Mendoza is a 62 y.o. male.  HPI     Home Medications Prior to Admission medications   Medication Sig Start Date End Date Taking? Authorizing Provider  dicyclomine (BENTYL) 10 MG capsule Take 1 capsule (10 mg total) by mouth 3 (three) times daily before meals. Patient taking differently: Take 10 mg by mouth 3 (three) times daily before meals. Pt states takes as needed 11/04/20 01/18/23 Yes Ghimire, Lyndel Safe, MD  insulin glargine, 1 Unit Dial, (TOUJEO SOLOSTAR) 300 UNIT/ML Solostar Pen Inject 15 Units into the skin daily. 04/09/22  Yes Hoy Register, MD  lipase/protease/amylase 24000-76000 units CPEP Take 1 capsule (24,000 Units total) by mouth 3 (three) times daily before meals. 09/10/22  Yes Dahal, Melina Schools, MD  losartan (COZAAR) 100 MG tablet Take 1 tablet (100 mg total) by mouth daily. 01/12/23  Yes Claiborne Rigg, NP  metFORMIN (GLUCOPHAGE-XR) 500 MG 24 hr tablet TAKE 1 TABLET (500 MG TOTAL) BY MOUTH IN THE MORNING AND AT BEDTIME 11/04/22  Yes Claiborne Rigg, NP  Multiple Vitamin (MULTIVITAMIN) tablet Take 1 tablet by mouth daily.   Yes [provider]  rosuvastatin (CRESTOR) 10 MG tablet Take 1 tablet (10 mg total) by mouth daily. 12/24/22  Yes Hoy Register, MD  Blood Pressure Monitor DEVI Please provide patient with insurance approved blood pressure monitor 12/15/22   Claiborne Rigg, NP  Insulin Pen Needle (B-D ULTRAFINE III SHORT PEN) 31G X 8 MM MISC Use to inject insulin once daily. 12/24/22   Hoy Register, MD      Allergies    Fentanyl and Morphine    Review of Systems   Review of Systems  Physical Exam Updated Vital Signs BP (!) 161/100 (BP Location: Right Arm)   Pulse 73   Temp  97.6 F (36.4 C) (Oral)   Resp 20   Wt 115 kg   SpO2 95%   BMI 33.45 kg/m  Physical Exam  ED Results / Procedures / Treatments   Labs (all labs ordered are listed, but only abnormal results are displayed) Labs Reviewed  LIPASE, BLOOD - Abnormal; Notable for the following components:      Result Value   Lipase 449 (*)    All other components within normal limits  COMPREHENSIVE METABOLIC PANEL - Abnormal; Notable for the following components:   Sodium 132 (*)    CO2 15 (*)    Glucose, Bld 406 (*)    BUN 23 (*)    Creatinine, Ser 1.29 (*)    Calcium 12.5 (*)    All other components within normal limits  CBC - Abnormal; Notable for the following components:   WBC 18.0 (*)    RBC 4.04 (*)    MCH 35.1 (*)    All other components within normal limits  URINALYSIS, ROUTINE W REFLEX MICROSCOPIC - Abnormal; Notable for the following components:   Glucose, UA >=500 (*)    Ketones, ur 5 (*)    Protein, ur 30 (*)    All other components within normal limits  BETA-HYDROXYBUTYRIC ACID  BLOOD GAS, VENOUS  BASIC METABOLIC PANEL    EKG None  Radiology No results found.  Procedures Procedures  {Document cardiac monitor, telemetry assessment procedure when  appropriate:1}  Medications Ordered in ED Medications  lactated ringers bolus 1,000 mL (1,000 mLs Intravenous Bolus 01/18/23 2217)  HYDROmorphone (DILAUDID) injection 1 mg (1 mg Intravenous Given 01/18/23 2217)  ondansetron (ZOFRAN) injection 4 mg (4 mg Intravenous Given 01/18/23 2215)  oxyCODONE-acetaminophen (PERCOCET/ROXICET) 5-325 MG per tablet 1 tablet (1 tablet Oral Given 01/18/23 2348)    ED Course/ Medical Decision Making/ A&P   {   Click here for ABCD2, HEART and other calculatorsREFRESH Note before signing :1}                              Medical Decision Making Amount and/or Complexity of Data Reviewed Labs: ordered.  Risk Prescription drug management.   ***  {Document critical care time when  appropriate:1} {Document review of labs and clinical decision tools ie heart score, Chads2Vasc2 etc:1}  {Document your independent review of radiology images, and any outside records:1} {Document your discussion with family members, caretakers, and with consultants:1} {Document social determinants of health affecting pt's care:1} {Document your decision making why or why not admission, treatments were needed:1} Final Clinical Impression(s) / ED Diagnoses Final diagnoses:  None    Rx / DC Orders ED Discharge Orders     None

## 2023-01-19 NOTE — ED Provider Notes (Signed)
[place] EMERGENCY DEPARTMENT AT [facility] Provider Note   CSN: [id number] Arrival date & time: 01/18/23  2046     History  Chief Complaint  Patient presents with   Abdominal Pain    Terrail Zweifel is a 62 y.o. male.  HPI     62 year old male comes in with chief complaint of abdominal pain.  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  He comes to the ER with 3 days of abdominal pain that is getting worse.  Patient also has associated nausea with vomiting.  He has tried to go on soft/clear liquid diet, but symptoms have not improved.  Patient has taken leftover oxycodone without significant pain relief.  Pain is epigastric in nature and radiating to his back.  He denies any associated fevers, chills.  Patient denies alcohol use.  Home Medications Prior to Admission medications   Medication Sig Start Date End Date Taking? Authorizing Provider  dicyclomine (BENTYL) 10 MG capsule Take 1 capsule (10 mg total) by mouth 3 (three) times daily before meals. Patient taking differently: Take 10 mg by mouth 3 (three) times daily before meals. Pt states takes as needed 11/04/20 01/18/23 Yes Ghimire, Lyndel Safe, MD  insulin glargine, 1 Unit Dial, (TOUJEO SOLOSTAR) 300 UNIT/ML Solostar Pen Inject 15 Units into the skin daily. 04/09/22  Yes Hoy Register, MD  lipase/protease/amylase 24000-76000 units CPEP Take 1 capsule (24,000 Units total) by mouth 3 (three) times daily before meals. 09/10/22  Yes Dahal, Melina Schools, MD  losartan (COZAAR) 100 MG tablet Take 1 tablet (100 mg total) by mouth daily. 01/12/23  Yes Claiborne Rigg, NP  metFORMIN (GLUCOPHAGE-XR) 500 MG 24 hr tablet TAKE 1 TABLET (500 MG TOTAL) BY MOUTH IN THE MORNING AND AT BEDTIME 11/04/22  Yes Claiborne Rigg, NP  Multiple Vitamin (MULTIVITAMIN) tablet Take 1 tablet by mouth daily.   Yes [provider]  rosuvastatin (CRESTOR) 10 MG tablet Take 1 tablet (10 mg  total) by mouth daily. 12/24/22  Yes Hoy Register, MD  Blood Pressure Monitor DEVI Please provide patient with insurance approved blood pressure monitor 12/15/22   Claiborne Rigg, NP  Insulin Pen Needle (B-D ULTRAFINE III SHORT PEN) 31G X 8 MM MISC Use to inject insulin once daily. 12/24/22   Hoy Register, MD      Allergies    Fentanyl and Morphine    Review of Systems   Review of Systems  All other systems reviewed and are negative.   Physical Exam Updated Vital Signs BP 121/82 (BP Location: Right Arm)   Pulse 69   Temp 98.1 F (36.7 C) (Oral)   Resp 16   Wt 112 kg   SpO2 94%   BMI 32.58 kg/m  Physical Exam Vitals and nursing note reviewed.  Constitutional:      Appearance: He is well-developed.  HENT:     Head: Atraumatic.  Cardiovascular:     Rate and Rhythm: Normal rate.  Pulmonary:     Effort: Pulmonary effort is normal.  Abdominal:     Palpations: Abdomen is soft.     Tenderness: There is abdominal tenderness. There is no guarding or rebound.  Musculoskeletal:     Cervical back: Neck supple.  Skin:    General: Skin is warm.  Neurological:     Mental Status: He is alert and oriented to person, place, and time.     ED Results / Procedures / Treatments   Labs (all labs  ordered are listed, but only abnormal results are displayed) Labs Reviewed  MRSA NEXT GEN BY PCR, NASAL - Abnormal; Notable for the following components:      Result Value   MRSA by PCR Next Gen DETECTED (*)    All other components within normal limits  LIPASE, BLOOD - Abnormal; Notable for the following components:   Lipase 449 (*)    All other components within normal limits  COMPREHENSIVE METABOLIC PANEL - Abnormal; Notable for the following components:   Sodium 132 (*)    CO2 15 (*)    Glucose, Bld 406 (*)    BUN 23 (*)    Creatinine, Ser 1.29 (*)    Calcium 12.5 (*)    All other components within normal limits  CBC - Abnormal; Notable for the following components:   WBC  18.0 (*)    RBC 4.04 (*)    MCH 35.1 (*)    All other components within normal limits  URINALYSIS, ROUTINE W REFLEX MICROSCOPIC - Abnormal; Notable for the following components:   Glucose, UA >=500 (*)    Ketones, ur 5 (*)    Protein, ur 30 (*)    All other components within normal limits  BETA-HYDROXYBUTYRIC ACID - Abnormal; Notable for the following components:   Beta-Hydroxybutyric Acid 0.78 (*)    All other components within normal limits  BLOOD GAS, VENOUS - Abnormal; Notable for the following components:   pCO2, Ven 42 (*)    pO2, Ven <31 (*)    Bicarbonate 19.7 (*)    Acid-base deficit 6.8 (*)    All other components within normal limits  BASIC METABOLIC PANEL - Abnormal; Notable for the following components:   Sodium 131 (*)    CO2 18 (*)    Glucose, Bld 381 (*)    BUN 25 (*)    Creatinine, Ser 1.27 (*)    Calcium 11.7 (*)    All other components within normal limits  COMPREHENSIVE METABOLIC PANEL - Abnormal; Notable for the following components:   Sodium 133 (*)    Potassium 3.3 (*)    CO2 17 (*)    Glucose, Bld 167 (*)    Calcium 11.3 (*)    Albumin 3.2 (*)    All other components within normal limits  MAGNESIUM - Abnormal; Notable for the following components:   Magnesium 1.6 (*)    All other components within normal limits  LIPASE, BLOOD - Abnormal; Notable for the following components:   Lipase 193 (*)    All other components within normal limits  HEMOGLOBIN A1C - Abnormal; Notable for the following components:   Hgb A1c MFr Bld 9.2 (*)    All other components within normal limits  CK - Abnormal; Notable for the following components:   Total CK 34 (*)    All other components within normal limits  GLUCOSE, CAPILLARY - Abnormal; Notable for the following components:   Glucose-Capillary 149 (*)    All other components within normal limits  GLUCOSE, CAPILLARY - Abnormal; Notable for the following components:   Glucose-Capillary 138 (*)    All other  components within normal limits  GLUCOSE, CAPILLARY - Abnormal; Notable for the following components:   Glucose-Capillary 156 (*)    All other components within normal limits  RAPID URINE DRUG SCREEN, HOSP PERFORMED - Abnormal; Notable for the following components:   Opiates POSITIVE (*)    All other components within normal limits  CBC - Abnormal; Notable for the following  components:   WBC 17.7 (*)    RBC 3.67 (*)    Hemoglobin 12.8 (*)    HCT 36.8 (*)    MCV 100.3 (*)    MCH 34.9 (*)    All other components within normal limits  GLUCOSE, CAPILLARY - Abnormal; Notable for the following components:   Glucose-Capillary 226 (*)    All other components within normal limits  GLUCOSE, CAPILLARY - Abnormal; Notable for the following components:   Glucose-Capillary 176 (*)    All other components within normal limits  GLUCOSE, CAPILLARY - Abnormal; Notable for the following components:   Glucose-Capillary 199 (*)    All other components within normal limits  GLUCOSE, CAPILLARY - Abnormal; Notable for the following components:   Glucose-Capillary 188 (*)    All other components within normal limits  GLUCOSE, CAPILLARY - Abnormal; Notable for the following components:   Glucose-Capillary 151 (*)    All other components within normal limits  CBG MONITORING, ED - Abnormal; Notable for the following components:   Glucose-Capillary 355 (*)    All other components within normal limits  CBG MONITORING, ED - Abnormal; Notable for the following components:   Glucose-Capillary 218 (*)    All other components within normal limits  PHOSPHORUS  ETHANOL  SODIUM, URINE, RANDOM  CREATININE, URINE, RANDOM  TRIGLYCERIDES  CBC  BASIC METABOLIC PANEL    EKG EKG Interpretation Date/Time:  Monday January 19 2023 02:04:59 EST Ventricular Rate:  71 PR Interval:  141 QRS Duration:  102 QT Interval:  402 QTC Calculation: 437 R Axis:   -30  Text Interpretation: Sinus rhythm Ventricular  premature complex Left axis deviation Confirmed by Palumbo, April ([id number]) on 01/19/2023 2:06:53 AM  Radiology CT ABDOMEN PELVIS W CONTRAST  Result Date: 01/19/2023 CLINICAL DATA:  Inpatient. Abdominal pain, suspected acute pancreatitis. EXAM: CT ABDOMEN AND PELVIS WITH CONTRAST TECHNIQUE: Multidetector CT imaging of the abdomen and pelvis was performed using the standard protocol following bolus administration of intravenous contrast. RADIATION DOSE REDUCTION: This exam was performed according to the departmental dose-optimization program which includes automated exposure control, adjustment of the mA and/or kV according to patient size and/or use of iterative reconstruction technique. CONTRAST:  OMNIPAQUE IOHEXOL 300 MG/ML  SOLN COMPARISON:  09/07/2022 CT abdomen/pelvis. FINDINGS: Lower chest: Chronic curvilinear parenchymal bands at both lung bases compatible with nonspecific scarring. No acute abnormality at the lung bases. Hepatobiliary: Normal liver size. No liver mass. Suggestion of tiny layering gallstones in the gallbladder neck. No gallbladder wall thickening or significant pericholecystic fluid. No biliary ductal dilatation. CBD diameter 3 mm. Pancreas: Generalized mild peripancreatic fat stranding compatible with acute pancreatitis. Tiny parenchymal calcification in the pancreatic head is unchanged. Chronic tiny 1.4 x 1.2 cm anterior pancreatic head pseudocyst (series 2/image 34), decreased from 2.7 x 1.9 cm on 09/07/2022 CT. Chronic irregular lobulated pancreatic body and tail 7.3 x 1.8 cm pseudocyst (series 2/image 27), previously 6.9 x 1.8 cm on 09/07/2022 CT using similar measurement technique, minimally increased. No acute interval peripancreatic fluid collections. No pancreatic duct dilation. Spleen: Normal size. No mass. Adrenals/Urinary Tract: Normal adrenals. No hydronephrosis. No renal masses. Normal bladder. Stomach/Bowel: Normal non-distended stomach. Normal caliber small bowel  with no small bowel wall thickening. Appendix not discretely visualized. Oral contrast transits to the right colon. Normal large bowel with no diverticulosis, large bowel wall thickening or pericolonic fat stranding. Vascular/Lymphatic: Atherosclerotic nonaneurysmal abdominal aorta. Patent portal, splenic, hepatic and renal veins. Mildly enlarged 1.9 cm portacaval node (series 2/image  27), stable from 09/07/2022 CT. No additional pathologically enlarged lymph nodes in the abdomen or pelvis. Reproductive: Normal size prostate. Other: No pneumoperitoneum. Trace pelvic ascites. Small fat containing umbilical hernia. Musculoskeletal: No aggressive appearing focal osseous lesions. Mild thoracolumbar spondylosis. IMPRESSION: 1. Acute on chronic pancreatitis. 2. Chronic pancreatic pseudocysts, as detailed, minimally increased in the pancreatic body and tail and decreased in the anterior pancreatic head since 09/07/2022 CT. No new interval peripancreatic fluid collections. 3. Suggestion of tiny layering gallstones in the gallbladder neck. No CT evidence of acute cholecystitis. No biliary ductal dilatation. 4. Stable mildly enlarged portacaval lymph node, nonspecific, probably reactive. 5. Trace pelvic ascites. 6. Small fat containing umbilical hernia. 7.  Aortic Atherosclerosis (ICD10-I70.0). Electronically Signed   By: Delbert Phenix M.D.   On: 01/19/2023 14:36    Procedures Procedures    Medications Ordered in ED Medications  dextrose 50 % solution 0-50 mL (has no administration in time range)  acetaminophen (TYLENOL) tablet 650 mg (has no administration in time range)    Or  acetaminophen (TYLENOL) suppository 650 mg (has no administration in time range)  melatonin tablet 3 mg (has no administration in time range)  Chlorhexidine Gluconate Cloth 2 % PADS 6 each (6 each Topical Not Given 01/19/23 1212)  insulin glargine-yfgn (SEMGLEE) injection 8 Units (8 Units Subcutaneous Given 01/19/23 0412)  insulin aspart  (novoLOG) injection 0-9 Units (2 Units Subcutaneous Given 01/19/23 1719)  insulin aspart (novoLOG) injection 0-5 Units ( Subcutaneous Not Given 01/19/23 2144)  enoxaparin (LOVENOX) injection 40 mg (40 mg Subcutaneous Given 01/19/23 0950)  rosuvastatin (CRESTOR) tablet 10 mg (10 mg Oral Given 01/19/23 0949)  losartan (COZAAR) tablet 100 mg (100 mg Oral Given 01/19/23 0949)  lactated ringers infusion ( Intravenous New Bag/Given 01/19/23 2215)  naloxone [facility]) injection 0.4 mg (has no administration in time range)    And  sodium chloride flush (NS) 0.9 % injection 9 mL (has no administration in time range)  ondansetron (ZOFRAN) injection 4 mg (4 mg Intravenous Given 01/19/23 1217)  diphenhydrAMINE (BENADRYL) injection 12.5 mg (has no administration in time range)    Or  diphenhydrAMINE (BENADRYL) 12.5 MG/5ML elixir 12.5 mg (has no administration in time range)  HYDROmorphone (DILAUDID) 1 mg/mL PCA injection (3 mg Intravenous Received 01/19/23 2029)  amoxicillin (AMOXIL) capsule 500 mg (500 mg Oral Given 01/19/23 2214)  lactated ringers bolus 1,000 mL (1,000 mLs Intravenous Bolus 01/18/23 2217)  HYDROmorphone (DILAUDID) injection 1 mg (1 mg Intravenous Given 01/18/23 2217)  ondansetron (ZOFRAN) injection 4 mg (4 mg Intravenous Given 01/18/23 2215)  oxyCODONE-acetaminophen (PERCOCET/ROXICET) 5-325 MG per tablet 1 tablet (1 tablet Oral Given 01/18/23 2348)  lactated ringers bolus 2,300 mL (2,300 mLs Intravenous Bolus 01/19/23 0118)  droperidol (INAPSINE) 2.5 MG/ML injection 1.25 mg (1.25 mg Intravenous Given 01/19/23 0223)  iohexol (OMNIPAQUE) 9 MG/ML oral solution 500 mL (500 mLs Oral Contrast Given 01/19/23 0721)  magnesium sulfate IVPB 2 g 50 mL (0 g Intravenous Stopped 01/19/23 1142)  potassium chloride SA (KLOR-CON M) CR tablet 40 mEq (40 mEq Oral Given 01/19/23 2214)  iohexol (OMNIPAQUE) 300 MG/ML solution 100 mL (100 mLs Intravenous Contrast Given 01/19/23 1005)    ED Course/ Medical  Decision Making/ A&P                                 Medical Decision Making Amount and/or Complexity of Data Reviewed Labs: ordered.  Risk Prescription drug management. Decision regarding  hospitalization.   This patient presents to the ED with chief complaint(s) of abd pain radiating to back with pertinent past medical history of pancreatitis.The complaint involves an extensive differential diagnosis and also carries with it a high risk of complications and morbidity.    The differential diagnosis includes Pancreatitis, Hepatobiliary pathology including cholelithiasis and cholecystitis, Gastritis/peptic ulcer disease, small bowel obstruction. Exam is reassuring - low suspicion for complication such as necrosis/infected pseudocyst.   The initial plan is to get basic labs.  No indication for CT at this time.  Additional history obtained: Records reviewed previous admission documents and previous CT scan.  Patient had 2 CT scans this year which were positive for pancreatitis but no complications.  Independent labs interpretation:  The following labs were independently interpreted: Lipase is close to 500.  Patient's bicarb is low.  He has no anion gap however.  Patient's blood sugar is high.  At this time, low suspicion for DKA.  We will still get venous blood gas, beta hydroxybutyrate acid.  Patient will get IV fluids along with pain control.  Oral challenge has been initiated.  Repeat labs for 1 AM has been ordered.  If patient passes oral challenge and his blood work appears better then he can be discharged.   Treatment and Reassessment: Results of the ED workup has been discussed with the patient.  Patient's care.  Signed out to incoming team.  Final Clinical Impression(s) / ED Diagnoses Final diagnoses:  Diabetic ketoacidosis without coma associated with other specified diabetes mellitus (HCC)  Acute pancreatitis, unspecified complication status, unspecified pancreatitis type     Rx / DC Orders ED Discharge Orders     None         Derwood Kaplan, MD 01/19/23 2318

## 2023-01-19 NOTE — Hospital Course (Signed)
62 y.o. M with DM, alcoholism in remission, hx necrotizing pancreatitis with pseudocyst in 2022, who presented with another pancreatitis flare.

## 2023-01-19 NOTE — Plan of Care (Signed)
Discussed with patient plan of care for the evening, pain management and admission questions with some teach back displayed.  What is important to the patient is to feel better and rest.  Problem: Education: Goal: Knowledge of General Education information will improve Description: Including pain rating scale, medication(s)/side effects and non-pharmacologic comfort measures Outcome: Progressing   Problem: Health Behavior/Discharge Planning: Goal: Ability to manage health-related needs will improve Outcome: Progressing

## 2023-01-19 NOTE — Assessment & Plan Note (Signed)
With hyperglycemia DKA ruled out Glucose controlled - Continue glargine - Continue sliding scale corrections - Hold metformin

## 2023-01-19 NOTE — ED Provider Notes (Signed)
  Physical Exam  BP (!) 161/100 (BP Location: Right Arm)   Pulse 73   Temp 97.6 F (36.4 C) (Oral)   Resp 20   Wt 115 kg   SpO2 95%   BMI 33.45 kg/m   Physical Exam Vitals and nursing note reviewed.  Constitutional:      General: He is not in acute distress.    Appearance: He is well-developed. He is not diaphoretic.  HENT:     Head: Normocephalic and atraumatic.  Eyes:     Extraocular Movements: Extraocular movements intact.     Conjunctiva/sclera: Conjunctivae normal.     Pupils: Pupils are equal, round, and reactive to light.  Cardiovascular:     Rate and Rhythm: Normal rate and regular rhythm.  Pulmonary:     Effort: Pulmonary effort is normal.     Breath sounds: Normal breath sounds. No wheezing or rales.  Abdominal:     General: Bowel sounds are normal.     Palpations: Abdomen is soft.     Tenderness: There is abdominal tenderness. There is no guarding or rebound.  Musculoskeletal:        General: Normal range of motion.     Cervical back: Normal range of motion and neck supple.  Skin:    General: Skin is warm and dry.  Neurological:     Mental Status: He is alert and oriented to person, place, and time.     Procedures  .Critical Care E&M  Performed by: Cy Blamer, MD Critical care provider statement:    Critical care was necessary to treat or prevent imminent or life-threatening deterioration of the following conditions:  Endocrine crisis   Critical care was time spent personally by me on the following activities:  Discussions with consultants, examination of patient, ordering and performing treatments and interventions, ordering and review of laboratory studies, ordering and review of radiographic studies, review of old charts and pulse oximetry   Care discussed with: admitting provider   After initial E/M assessment, critical care services were subsequently performed that were exclusive of separately billable procedures or treatment.     ED Course /  MDM    Medical Decision Making Amount and/or Complexity of Data Reviewed External Data Reviewed: notes. Labs: ordered. Decision-making details documented in ED Course.    Details: Glucose 406, now 381 elevated.  Ketones in urine.  Elevated lipase 449 consistent with pancreatitis elevated white count 18.1 likely from emesis   Risk Prescription drug management. Decision regarding hospitalization.  Critical Care Total time providing critical care: 30 minutes          Blaize Nipper, MD 01/19/23 [id number]

## 2023-01-19 NOTE — ED Notes (Addendum)
Pt tolerated po challenge with of Gatorlyte w/out any vomiting.
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
## 2023-01-19 NOTE — Plan of Care (Signed)

## 2023-01-19 NOTE — Plan of Care (Signed)
  Problem: Clinical Measurements: Goal: Will remain free from infection Outcome: Progressing   Problem: Activity: Goal: Risk for activity intolerance will decrease Outcome: Progressing   Problem: Pain Management: Goal: General experience of comfort will improve Outcome: Progressing   Problem: Safety: Goal: Ability to remain free from injury will improve Outcome: Progressing   Problem: Skin Integrity: Goal: Risk for impaired skin integrity will decrease Outcome: Progressing   Problem: Metabolic: Goal: Ability to maintain appropriate glucose levels will improve Outcome: Progressing

## 2023-01-19 NOTE — Progress Notes (Signed)
    Patient: Bryce Mendoza [id number] DOB: 01-Dec-1960      Brief hospital course: 62 y.o. M with DM, alcoholism in remission, hx necrotizing pancreatitis with pseudocyst in 2022, who presented with another pancreatitis flare.    This is a no charge note, for further details, please see the H&P by my partner, Dr. Arlean Hopping from earlier today.   Principal Problem:   Acute pancreatitis Active Problems:   Hypokalemia   Hypomagnesemia   DM2 (diabetes mellitus, type 2) (HCC)   History of pulmonary embolism   Benign essential HTN   Hypercalcemia   AKI (acute kidney injury) (HCC)   Hyponatremia    DKA ruled out.  Clinically has acute pancreatitis.  CT abdomen pending.  Lipase trending down but pain severe, not controlled with IV Dilaudid. - Scale back diet to clears only - Start IV fluids - Follow CT       Physical Exam: BP (!) 158/90   Pulse 73   Temp 97.7 F (36.5 C) (Oral)   Resp 15   Wt 112 kg   SpO2 95%   BMI 32.58 kg/m   Patient seen and examined.  Appears benign   Family Communication: None present        Author: Alberteen Sam, MD 01/19/2023 2:32 PM

## 2023-01-19 NOTE — Progress Notes (Signed)
Connected patient to bedside continuous pulse ox monitor. 

## 2023-01-20 DIAGNOSIS — K8500 Idiopathic acute pancreatitis without necrosis or infection: Secondary | ICD-10-CM | POA: Diagnosis not present

## 2023-01-20 DIAGNOSIS — E669 Obesity, unspecified: Secondary | ICD-10-CM | POA: Insufficient documentation

## 2023-01-20 DIAGNOSIS — I10 Essential (primary) hypertension: Secondary | ICD-10-CM | POA: Diagnosis not present

## 2023-01-20 DIAGNOSIS — E8721 Acute metabolic acidosis: Secondary | ICD-10-CM | POA: Diagnosis not present

## 2023-01-20 DIAGNOSIS — N179 Acute kidney failure, unspecified: Secondary | ICD-10-CM | POA: Diagnosis not present

## 2023-01-20 LAB — BASIC METABOLIC PANEL
Anion gap: 9 (ref 5–15)
BUN: 8 mg/dL (ref 6–20)
CO2: 17 mmol/L — ABNORMAL LOW (ref 22–32)
Calcium: 8.6 mg/dL — ABNORMAL LOW (ref 8.9–10.3)
Chloride: 103 mmol/L (ref 98–111)
Creatinine, Ser: 0.51 mg/dL — ABNORMAL LOW (ref 0.61–1.24)
GFR, Estimated: >60 mL/min (ref >60)
Glucose, Bld: 146 mg/dL — ABNORMAL HIGH (ref 70–99)
Potassium: 3.9 mmol/L (ref 3.5–5.1)
Sodium: 129 mmol/L — ABNORMAL LOW (ref 135–145)

## 2023-01-20 LAB — CBC
HCT: 30.5 % — ABNORMAL LOW (ref 39.0–52.0)
Hemoglobin: 10.6 g/dL — ABNORMAL LOW (ref 13.0–17.0)
MCH: 35.2 pg — ABNORMAL HIGH (ref 26.0–34.0)
MCHC: 34.8 g/dL (ref 30.0–36.0)
MCV: 101.3 fL — ABNORMAL HIGH (ref 80.0–100.0)
Platelets: 194 10*3/uL (ref 150–400)
RBC: 3.01 MIL/uL — ABNORMAL LOW (ref 4.22–5.81)
RDW: 11.9 % (ref 11.5–15.5)
WBC: 10.4 10*3/uL (ref 4.0–10.5)
nRBC: 0 % (ref 0.0–0.2)

## 2023-01-20 LAB — GLUCOSE, CAPILLARY
Glucose-Capillary: 160 mg/dL — ABNORMAL HIGH (ref 70–99)
Glucose-Capillary: 94 mg/dL (ref 70–99)
Glucose-Capillary: 116 mg/dL — ABNORMAL HIGH (ref 70–99)
Glucose-Capillary: 143 mg/dL — ABNORMAL HIGH (ref 70–99)

## 2023-01-20 LAB — PARATHYROID HORMONE, INTACT (NO CA): PTH: 3 pg/mL — ABNORMAL LOW (ref 15–65)

## 2023-01-20 MED ORDER — LACTATED RINGERS IV SOLN: INTRAVENOUS | Status: AC

## 2023-01-20 NOTE — Assessment & Plan Note (Signed)
Creatinine 1.3 on admission, resolved to 0.5 today

## 2023-01-20 NOTE — Progress Notes (Signed)
  Progress Note   Patient: Bryce Mendoza [id number] DOB: [birth year] DOA: 01/18/2023     1 DOS: the patient was seen and examined on 01/20/2023 At 1:30 PM      Brief hospital course: 62 y.o. M with DM, alcoholism in remission, hx necrotizing pancreatitis with pseudocyst in 2022, who presented with another pancreatitis flare.     Assessment and Plan: * Acute pancreatitis Admitted on fluids, analgesics.  Lipase 400 > 100s.  CT showed stable old pseudocyst, new pancreatic edema, no other acute findings.  Started on PCA  Feeling better today, so tried grits and immediately threw up. - Continue IV fluids 24 more hours - Back to clears - Continue PCA - ADAT and transition to oral analgesics as able - Resume Creon when next advancing diet    Tooth infection Patient has redness and swelling over his right cheek and right dental pain. - Start amoxicillin for 5 days - Outpatient follow-up with dentist  Hyponatremia Asymptomatic - Continue IV fluids -Trend BMP  AKI (acute kidney injury) (HCC) Creatinine 1.3 on admission, resolved to 0.5 today  Hypercalcemia Resolved with fluids  Benign essential HTN Blood pressure normal - Continue losartan  History of pulmonary embolism No longer on blood thinner  DM2 (diabetes mellitus, type 2) (HCC) With hyperglycemia DKA ruled out Glucose controlled - Continue glargine - Continue sliding scale corrections - Hold metformin  Acute metabolic acidosis Due to pancreatitis -Continue fluids - Trend CMP  Hypomagnesemia Supplemented  Hypokalemia Resolved  Obesity  Class 1, BMI 32        Subjective: Patient is feeling like his pain is better controlled today, so he tried some grits, but then immediately vomited and had abdominal discomfort.  No fever, no confusion, no respiratory symptoms.  Mentation good.  No nursing concerns.     Physical Exam: BP 134/86 (BP Location: Right Arm)   Pulse 70   Temp 98.5 F (36.9  C)   Resp 14   Wt 112 kg   SpO2 98%   BMI 32.58 kg/m   Obese adult male, lying in bed, interactive and appropriate RRR, no murmurs, no peripheral edema Respiratory normal, lungs clear without rales or wheezes Abdomen soft with diffuse tenderness and guarding, slight distention, no ascites  Attention normal, affect appropriate, judgment and insight appear normal, face symmetric, speech fluent   Data Reviewed: Basic metabolic panel shows sodium down to 129, potassium and creatinine normalized, calcium normalized CBC shows white blood cell count normal, hemoglobin 10.6, no change Glucose is normal  Family Communication: None    Disposition: Status is: Inpatient The patient was admitted with recurrent pancreatitis  He has done this before, and ICU after gradually advance his diet, taper off his PCA.  Suspect that will happen by the end of the week or over the weekend.        Author: Alberteen Sam, MD 01/20/2023 2:53 PM  For on call review [email or web address].

## 2023-01-20 NOTE — Plan of Care (Signed)
  Problem: Education: Goal: Knowledge of General Education information will improve Description: Including pain rating scale, medication(s)/side effects and non-pharmacologic comfort measures Outcome: Progressing   Problem: Health Behavior/Discharge Planning: Goal: Ability to manage health-related needs will improve Outcome: Progressing   Problem: Clinical Measurements: Goal: Ability to maintain clinical measurements within normal limits will improve Outcome: Progressing Goal: Will remain free from infection Outcome: Progressing Goal: Diagnostic test results will improve Outcome: Progressing Goal: Respiratory complications will improve Outcome: Progressing Goal: Cardiovascular complication will be avoided Outcome: Progressing   Problem: Activity: Goal: Risk for activity intolerance will decrease Outcome: Progressing   Problem: Nutrition: Goal: Adequate nutrition will be maintained Outcome: Progressing   Problem: Coping: Goal: Level of anxiety will decrease Outcome: Progressing   Problem: Pain Management: Goal: General experience of comfort will improve Outcome: Progressing   Problem: Safety: Goal: Ability to remain free from injury will improve Outcome: Progressing   Problem: Skin Integrity: Goal: Risk for impaired skin integrity will decrease Outcome: Progressing

## 2023-01-20 NOTE — Assessment & Plan Note (Signed)
Resolved with fluids °

## 2023-01-20 NOTE — Assessment & Plan Note (Signed)
Supplemented 

## 2023-01-20 NOTE — Assessment & Plan Note (Signed)
Admitted on fluids, analgesics.  Lipase 400 > 100s.  CT showed stable old pseudocyst, new pancreatic edema, no other acute findings.  Started on PCA  Feeling better today, so tried grits and immediately threw up. - Continue IV fluids 24 more hours - Back to clears - Continue PCA - ADAT and transition to oral analgesics as able - Resume Creon when next advancing diet

## 2023-01-20 NOTE — Assessment & Plan Note (Signed)
Asymptomatic - Continue IV fluids -Trend BMP

## 2023-01-20 NOTE — Progress Notes (Signed)
   01/20/23 1228  TOC Brief Assessment  Insurance and Status Reviewed  Patient has primary care physician Yes  Home environment has been reviewed Apartment  Prior level of function: Independent  Prior/Current Home Services No current home services  Social Determinants of Health Reivew SDOH reviewed no interventions necessary  Readmission risk has been reviewed Yes  Transition of care needs no transition of care needs at this time

## 2023-01-20 NOTE — Assessment & Plan Note (Signed)
Blood pressure normal - Continue losartan

## 2023-01-20 NOTE — Assessment & Plan Note (Signed)
Due to pancreatitis -Continue fluids - Trend CMP

## 2023-01-20 NOTE — Assessment & Plan Note (Signed)
No longer on blood thinner

## 2023-01-20 NOTE — Assessment & Plan Note (Signed)
Patient has redness and swelling over his right cheek and right dental pain. - Start amoxicillin for 5 days - Outpatient follow-up with dentist

## 2023-01-20 NOTE — Assessment & Plan Note (Addendum)
Resolved

## 2023-01-21 ENCOUNTER — Inpatient Hospital Stay (HOSPITAL_COMMUNITY): Payer: 59

## 2023-01-21 DIAGNOSIS — I10 Essential (primary) hypertension: Secondary | ICD-10-CM | POA: Diagnosis not present

## 2023-01-21 DIAGNOSIS — K8500 Idiopathic acute pancreatitis without necrosis or infection: Secondary | ICD-10-CM

## 2023-01-21 LAB — CBC
HCT: 35.7 % — ABNORMAL LOW (ref 39.0–52.0)
Hemoglobin: 11.9 g/dL — ABNORMAL LOW (ref 13.0–17.0)
MCH: 34.6 pg — ABNORMAL HIGH (ref 26.0–34.0)
MCHC: 33.3 g/dL (ref 30.0–36.0)
MCV: 103.8 fL — ABNORMAL HIGH (ref 80.0–100.0)
Platelets: 237 10*3/uL (ref 150–400)
RBC: 3.44 MIL/uL — ABNORMAL LOW (ref 4.22–5.81)
RDW: 11.9 % (ref 11.5–15.5)
WBC: 8.3 10*3/uL (ref 4.0–10.5)
nRBC: 0 % (ref 0.0–0.2)

## 2023-01-21 LAB — GLUCOSE, CAPILLARY
Glucose-Capillary: 119 mg/dL — ABNORMAL HIGH (ref 70–99)
Glucose-Capillary: 156 mg/dL — ABNORMAL HIGH (ref 70–99)
Glucose-Capillary: 137 mg/dL — ABNORMAL HIGH (ref 70–99)
Glucose-Capillary: 178 mg/dL — ABNORMAL HIGH (ref 70–99)

## 2023-01-21 LAB — MAGNESIUM: Magnesium: 1.5 mg/dL — ABNORMAL LOW (ref 1.7–2.4)

## 2023-01-21 LAB — COMPREHENSIVE METABOLIC PANEL
ALT: 17 U/L (ref 0–44)
AST: 35 U/L (ref 15–41)
Albumin: 2.9 g/dL — ABNORMAL LOW (ref 3.5–5.0)
Alkaline Phosphatase: 104 U/L (ref 38–126)
Anion gap: 8 (ref 5–15)
BUN: 7 mg/dL (ref 6–20)
CO2: 23 mmol/L (ref 22–32)
Calcium: 9 mg/dL (ref 8.9–10.3)
Chloride: 105 mmol/L (ref 98–111)
Creatinine, Ser: 0.63 mg/dL (ref 0.61–1.24)
GFR, Estimated: >60 mL/min (ref >60)
Glucose, Bld: 112 mg/dL — ABNORMAL HIGH (ref 70–99)
Potassium: 3.7 mmol/L (ref 3.5–5.1)
Sodium: 136 mmol/L (ref 135–145)
Total Bilirubin: 0.7 mg/dL (ref <1.2)
Total Protein: 5.7 g/dL — ABNORMAL LOW (ref 6.5–8.1)

## 2023-01-21 LAB — LIPASE, BLOOD: Lipase: 21 U/L (ref 11–51)

## 2023-01-21 MED ORDER — MAGNESIUM SULFATE 4 GM/100ML IV SOLN
4.0000 g | Freq: Once | INTRAVENOUS | Status: AC
  Administered 2023-01-21: 4 g via INTRAVENOUS
  Filled 2023-01-21: qty 100

## 2023-01-21 NOTE — Inpatient Diabetes Management (Signed)
Inpatient Diabetes Program Recommendations  AACE/ADA: New Consensus Statement on Inpatient Glycemic Control (2015)  Target Ranges:  Prepandial:   less than 140 mg/dL      Peak postprandial:   less than 180 mg/dL (1-2 hours)      Critically ill patients:  140 - 180 mg/dL   Lab Results  Component Value Date   GLUCAP 137 (H) 01/21/2023   HGBA1C 9.2 (H) 01/19/2023    Review of Glycemic Control  Diabetes history: DM2 Outpatient Diabetes medications: Toujeo 15 daily, metformin 500 BID Current orders for Inpatient glycemic control: Semglee 8 daily, Novolog 0-9 TID with meals and 0-5 HS  Inpatient Diabetes Program Recommendations:    Agree with orders.  Spoke with pt at bedside regarding his diabetes and HgbA1C of 9.2%. Tolerating [place] diet. States this is the worst bout of pancreatitis he's ever had. Sees NP at [facility] for his PCP. HgbA1C was 5.9% in May 2024. Monitors blood sugars with glucose meter. Takes insulin as prescribed. Tries to eat healthy. May be candidate for CGM at discharge. Denies any hypoglycemia.   Will continue to follow.  Thank you. Ailene Ards, RD, LDN, CDCES Inpatient Diabetes Coordinator [phone]

## 2023-01-21 NOTE — Progress Notes (Addendum)
[facility] PROGRESS NOTE   Bryce Mendoza WNU:[id number] DOB: 08-24-1960 DOA: 01/18/2023  PCP: Claiborne Rigg, NP  Brief History: 62 y.o. M with DM, alcoholism in remission, hx necrotizing pancreatitis with pseudocyst in 2022, who presented with another pancreatitis flare.   Consultants: None  Procedures: None    Subjective/Interval History: Patient has 4 out of 10 pain in the upper abdomen.  Some nausea but no vomiting this morning but did have vomiting yesterday.  No fever or chills.    Assessment/Plan:  Acute pancreatitis/Pseudocysts CT shows stable findings for the most part.  Improvement noted in 1 pseudocyst while the other 1 was noted to be slightly larger. Lipase was noted to be in the 400s.  Improvement noted in the levels.  Still remains quite symptomatic.  Continue supportive treatment.  Continue clear liquid diet for now. Noted to be on PCA for pain control.  Once his nausea and vomiting is controlled he can be started on oral pain medications. Resume Creon when advancing diet.  Dental infection On amoxicillin.  Reports improvement in tooth pain.  Concern for cholelithiasis CT raise concern for tiny gallstones in the gallbladder neck.  No biliary ductal dilatation noted.  Bilirubin and alkaline phosphatase levels are normal.  Not seen previously.  Will do a right upper quadrant ultrasound.  Hyponatremia Improved.  Hypomagnesemia Will be supplemented.  Macrocytic anemia Stable hemoglobin noted.  Outpatient workup.  Acute kidney injury Resolved.  Hypercalcemia Resolved.  Benign essential hypertension Losartan being continued.  Monitor blood pressures.  History of pulmonary embolism No longer on anticoagulation.  Diabetes mellitus type 2, uncontrolled with hyperglycemia Continue glargine and SSI.  Holding metformin.  Obesity Estimated body mass index is 34.26 kg/m as calculated from the following:   Height as of 12/15/22: 6\' 1"  (1.854  m).   Weight as of this encounter: 117.8 kg.  DVT Prophylaxis: Lovenox Code Status: Full code Family Communication: Discussed with patient Disposition Plan: Hopefully return home when improved.  Start mobilizing.     Medications: Scheduled:  amoxicillin  500 mg Oral Q8H   Chlorhexidine Gluconate Cloth  6 each Topical Daily   enoxaparin (LOVENOX) injection  40 mg Subcutaneous Q24H   HYDROmorphone   Intravenous Q4H   insulin aspart  0-5 Units Subcutaneous QHS   insulin aspart  0-9 Units Subcutaneous TID WC   insulin glargine-yfgn  8 Units Subcutaneous Q24H   losartan  100 mg Oral Daily   rosuvastatin  10 mg Oral Daily   Continuous:  lactated ringers 150 mL/hr at 01/21/23 0129   IHK:[id number] **OR** acetaminophen, dextrose, diphenhydrAMINE **OR** diphenhydrAMINE, melatonin, naloxone **AND** sodium chloride flush, ondansetron (ZOFRAN) IV  Antibiotics: Anti-infectives (From admission, onward)    Start     Dose/Rate Route Frequency Ordered Stop   01/19/23 1700  amoxicillin (AMOXIL) capsule 500 mg        500 mg Oral Every 8 hours 01/19/23 1618 01/24/23 0559       Objective:  Vital Signs  Vitals:   01/21/23 0415 01/21/23 0500 01/21/23 0542 01/21/23 0812  BP: 137/86     Pulse: 64     Resp: 14  15 18   Temp: (!) 97.5 F (36.4 C)     TempSrc:      SpO2: 99%  97% 98%  Weight:  117.8 kg      Intake/Output Summary (Last 24 hours) at 01/21/2023 0928 Last data filed at 01/21/2023 0542 Gross per 24 hour  Intake 350 ml  Output 1500 ml  Net -1150 ml   Filed Weights   01/18/23 2055 01/19/23 0500 01/21/23 0500  Weight: 115 kg 112 kg 117.8 kg    General appearance: Awake alert.  In no distress Resp: Clear to auscultation bilaterally.  Normal effort Cardio: S1-S2 is normal regular.  No S3-S4.  No rubs murmurs or bruit GI: Abdomen is soft.  Tender in the epigastrium without any rebound rigidity or guarding.  No masses organomegaly. Extremities: No edema.  Full range of  motion of lower extremities. Neurologic: Alert and oriented x3.  No focal neurological deficits.    Lab Results:  Data Reviewed: I have personally reviewed following labs and reports of the imaging studies  CBC: Recent Labs  Lab 01/18/23 2105 01/19/23 0733 01/20/23 0538 01/21/23 0533  WBC 18.0* 17.7* 10.4 8.3  HGB 14.2 12.8* 10.6* 11.9*  HCT 40.0 36.8* 30.5* 35.7*  MCV 99.0 100.3* 101.3* 103.8*  PLT 280 253 194 237    Basic Metabolic Panel: Recent Labs  Lab 01/18/23 2105 01/18/23 2356 01/19/23 0705 01/20/23 0538 01/21/23 0533  NA 132* 131* 133* 129* 136  K 4.4 3.9 3.3* 3.9 3.7  CL 103 103 106 103 105  CO2 15* 18* 17* 17* 23  GLUCOSE 406* 381* 167* 146* 112*  BUN 23* 25* 18 8 7   CREATININE 1.29* 1.27* 0.83 0.51* 0.63  CALCIUM 12.5* 11.7* 11.3* 8.6* 9.0  MG  --   --  1.6*  --  1.5*  PHOS  --   --  2.9  --   --     GFR: Estimated Creatinine Clearance: 132.1 mL/min (by C-G formula based on SCr of 0.63 mg/dL).  Liver Function Tests: Recent Labs  Lab 01/18/23 2105 01/19/23 0705 01/21/23 0533  AST 25 20 35  ALT 16 16 17   ALKPHOS 94 79 104  BILITOT 0.7 0.7 0.7  PROT 7.5 6.6 5.7*  ALBUMIN 3.8 3.2* 2.9*    Recent Labs  Lab 01/18/23 2105 01/19/23 0705 01/21/23 0533  LIPASE 449* 193* 21     Cardiac Enzymes: Recent Labs  Lab 01/19/23 0705  CKTOTAL 34*     HbA1C: Recent Labs    01/19/23 0705  HGBA1C 9.2*    CBG: Recent Labs  Lab 01/20/23 0805 01/20/23 1203 01/20/23 1643 01/20/23 2202 01/21/23 0745  GLUCAP 160* 94 116* 143* 119*    Lipid Profile: Recent Labs    01/19/23 0705  TRIG 83     Recent Results (from the past 240 hour(s))  MRSA Next Gen by PCR, Nasal     Status: Abnormal   Collection Time: 01/19/23  3:28 AM   Specimen: Nasal Mucosa; Nasal Swab  Result Value Ref Range Status   MRSA by PCR Next Gen DETECTED (A) NOT DETECTED Final    Comment: CRITICAL RESULT CALLED TO, READ BACK BY AND VERIFIED WITH: SALAS B. @ 0524  01/19/23 MCLEAN K. (NOTE) The GeneXpert MRSA Assay (FDA approved for NASAL specimens only), is one component of a comprehensive MRSA colonization surveillance program. It is not intended to diagnose MRSA infection nor to guide or monitor treatment for MRSA infections. Test performance is not FDA approved in patients less than 12 years old. Performed at [facility], [address] [place], [place] [address]       Radiology Studies: CT ABDOMEN PELVIS W CONTRAST  Result Date: 01/19/2023 CLINICAL DATA:  Inpatient. Abdominal pain, suspected acute pancreatitis. EXAM: CT ABDOMEN AND PELVIS WITH CONTRAST TECHNIQUE: Multidetector CT imaging of the abdomen and pelvis  was performed using the standard protocol following bolus administration of intravenous contrast. RADIATION DOSE REDUCTION: This exam was performed according to the departmental dose-optimization program which includes automated exposure control, adjustment of the mA and/or kV according to patient size and/or use of iterative reconstruction technique. CONTRAST:  OMNIPAQUE IOHEXOL 300 MG/ML  SOLN COMPARISON:  09/07/2022 CT abdomen/pelvis. FINDINGS: Lower chest: Chronic curvilinear parenchymal bands at both lung bases compatible with nonspecific scarring. No acute abnormality at the lung bases. Hepatobiliary: Normal liver size. No liver mass. Suggestion of tiny layering gallstones in the gallbladder neck. No gallbladder wall thickening or significant pericholecystic fluid. No biliary ductal dilatation. CBD diameter 3 mm. Pancreas: Generalized mild peripancreatic fat stranding compatible with acute pancreatitis. Tiny parenchymal calcification in the pancreatic head is unchanged. Chronic tiny 1.4 x 1.2 cm anterior pancreatic head pseudocyst (series 2/image 34), decreased from 2.7 x 1.9 cm on 09/07/2022 CT. Chronic irregular lobulated pancreatic body and tail 7.3 x 1.8 cm pseudocyst (series 2/image 27), previously 6.9 x 1.8  cm on 09/07/2022 CT using similar measurement technique, minimally increased. No acute interval peripancreatic fluid collections. No pancreatic duct dilation. Spleen: Normal size. No mass. Adrenals/Urinary Tract: Normal adrenals. No hydronephrosis. No renal masses. Normal bladder. Stomach/Bowel: Normal non-distended stomach. Normal caliber small bowel with no small bowel wall thickening. Appendix not discretely visualized. Oral contrast transits to the right colon. Normal large bowel with no diverticulosis, large bowel wall thickening or pericolonic fat stranding. Vascular/Lymphatic: Atherosclerotic nonaneurysmal abdominal aorta. Patent portal, splenic, hepatic and renal veins. Mildly enlarged 1.9 cm portacaval node (series 2/image 27), stable from 09/07/2022 CT. No additional pathologically enlarged lymph nodes in the abdomen or pelvis. Reproductive: Normal size prostate. Other: No pneumoperitoneum. Trace pelvic ascites. Small fat containing umbilical hernia. Musculoskeletal: No aggressive appearing focal osseous lesions. Mild thoracolumbar spondylosis. IMPRESSION: 1. Acute on chronic pancreatitis. 2. Chronic pancreatic pseudocysts, as detailed, minimally increased in the pancreatic body and tail and decreased in the anterior pancreatic head since 09/07/2022 CT. No new interval peripancreatic fluid collections. 3. Suggestion of tiny layering gallstones in the gallbladder neck. No CT evidence of acute cholecystitis. No biliary ductal dilatation. 4. Stable mildly enlarged portacaval lymph node, nonspecific, probably reactive. 5. Trace pelvic ascites. 6. Small fat containing umbilical hernia. 7.  Aortic Atherosclerosis (ICD10-I70.0). Electronically Signed   By: Delbert Phenix M.D.   On: 01/19/2023 14:36       LOS: 2 days   Alzora Ha Rito Ehrlich  [facility] Pager on [email or web address]  01/21/2023, 9:28 AM

## 2023-01-21 NOTE — Plan of Care (Signed)

## 2023-01-22 DIAGNOSIS — I10 Essential (primary) hypertension: Secondary | ICD-10-CM | POA: Diagnosis not present

## 2023-01-22 DIAGNOSIS — E876 Hypokalemia: Secondary | ICD-10-CM | POA: Diagnosis not present

## 2023-01-22 DIAGNOSIS — K8500 Idiopathic acute pancreatitis without necrosis or infection: Secondary | ICD-10-CM | POA: Diagnosis not present

## 2023-01-22 LAB — GLUCOSE, CAPILLARY
Glucose-Capillary: 154 mg/dL — ABNORMAL HIGH (ref 70–99)
Glucose-Capillary: 240 mg/dL — ABNORMAL HIGH (ref 70–99)
Glucose-Capillary: 165 mg/dL — ABNORMAL HIGH (ref 70–99)
Glucose-Capillary: 257 mg/dL — ABNORMAL HIGH (ref 70–99)

## 2023-01-22 LAB — COMPREHENSIVE METABOLIC PANEL
ALT: 17 U/L (ref 0–44)
AST: 28 U/L (ref 15–41)
Albumin: 2.9 g/dL — ABNORMAL LOW (ref 3.5–5.0)
Alkaline Phosphatase: 113 U/L (ref 38–126)
Anion gap: 11 (ref 5–15)
BUN: <5 mg/dL — ABNORMAL LOW (ref 6–20)
CO2: 23 mmol/L (ref 22–32)
Calcium: 8.3 mg/dL — ABNORMAL LOW (ref 8.9–10.3)
Chloride: 102 mmol/L (ref 98–111)
Creatinine, Ser: 0.56 mg/dL — ABNORMAL LOW (ref 0.61–1.24)
GFR, Estimated: >60 mL/min (ref >60)
Glucose, Bld: 148 mg/dL — ABNORMAL HIGH (ref 70–99)
Potassium: 3.3 mmol/L — ABNORMAL LOW (ref 3.5–5.1)
Sodium: 136 mmol/L (ref 135–145)
Total Bilirubin: 0.6 mg/dL (ref <1.2)
Total Protein: 6 g/dL — ABNORMAL LOW (ref 6.5–8.1)

## 2023-01-22 LAB — MAGNESIUM: Magnesium: 1.9 mg/dL (ref 1.7–2.4)

## 2023-01-22 LAB — LIPASE, BLOOD: Lipase: 20 U/L (ref 11–51)

## 2023-01-22 MED ORDER — POTASSIUM CHLORIDE CRYS ER 20 MEQ PO TBCR
40.0000 meq | EXTENDED_RELEASE_TABLET | Freq: Once | ORAL | Status: AC
  Administered 2023-01-22: 40 meq via ORAL
  Filled 2023-01-22: qty 2

## 2023-01-22 MED ORDER — OXYCODONE HCL 5 MG PO TABS
5.0000 mg | ORAL_TABLET | ORAL | Status: DC | PRN
  Filled 2023-01-22: qty 1

## 2023-01-22 MED ORDER — HYDROMORPHONE HCL 1 MG/ML IJ SOLN
0.5000 mg | INTRAMUSCULAR | Status: DC | PRN
Start: 2023-01-22 — End: 2023-01-23
  Administered 2023-01-22 – 2023-01-23 (×3): 1 mg via INTRAVENOUS
  Filled 2023-01-22 (×3): qty 1

## 2023-01-22 NOTE — Inpatient Diabetes Management (Signed)
Inpatient Diabetes Program Recommendations  AACE/ADA: New Consensus Statement on Inpatient Glycemic Control (2015)  Target Ranges:  Prepandial:   less than 140 mg/dL      Peak postprandial:   less than 180 mg/dL (1-2 hours)      Critically ill patients:  140 - 180 mg/dL   Lab Results  Component Value Date   GLUCAP 240 (H) 01/22/2023   HGBA1C 9.2 (H) 01/19/2023    Review of Glycemic Control  Latest Reference Range & Units 01/21/23 11:33 01/21/23 17:39 01/21/23 21:24 01/22/23 07:13 01/22/23 11:21  Glucose-Capillary 70 - 99 mg/dL 161 (H) 096 (H) 045 (H) 154 (H) 240 (H)   Diabetes history: DM Outpatient Diabetes medications:  Metformin 500 mg bid Toujeo 15 units daily Current orders for Inpatient glycemic control:  Novolog 0-9 units tid with meals and HS Semglee 8 units daily  Inpatient Diabetes Program Recommendations:    Consider increasing Semglee to home basal insulin dose of 15 units daily.   Thanks,  Beryl Meager, RN, BC-ADM Inpatient Diabetes Coordinator Pager [phone]

## 2023-01-22 NOTE — Progress Notes (Signed)
[facility] PROGRESS NOTE   Bryce Mendoza ZOX:[id number] DOB: [birth year] DOA: 01/18/2023  PCP: Claiborne Rigg, NP  Brief History: 62 y.o. M with DM, alcoholism in remission, hx necrotizing pancreatitis with pseudocyst in 2022, who presented with another pancreatitis flare.   Consultants: None  Procedures: None   Subjective/Interval History: Patient mentions that his abdominal pain is improving.  Has not required pain medicines in several hours.  Tolerated his full liquid diet well yesterday without any vomiting.  Asking for advancement in diet.    Assessment/Plan:  Acute pancreatitis/Pseudocysts CT shows stable findings for the most part.  Improvement noted in 1 pseudocyst while the other 1 was noted to be slightly larger. Lipase was noted to be in the 400s.  Improvement noted in the levels.   Symptoms appear to be improving.  Can transition from PCA to as needed medications today. Advance diet to soft.  Will resume his Creon.    Dental infection On amoxicillin.  Reports improvement in tooth pain.  Concern for cholelithiasis CT raise concern for tiny gallstones in the gallbladder neck.  No biliary ductal dilatation noted.  Bilirubin and alkaline phosphatase levels are normal.  Stones were not seen on previous imaging studies.  Ultrasound was done which also did not reveal any stones.  Findings discussed with patient.  No further testing for now.    Hyponatremia Improved.  Hypokalemia/hypomagnesemia Replace potassium.  Magnesium noted to be normal today.    Macrocytic anemia Stable hemoglobin noted.  Outpatient workup.  Acute kidney injury Resolved.  Hypercalcemia Resolved.  Benign essential hypertension Losartan being continued.  Monitor blood pressures.  History of pulmonary embolism No longer on anticoagulation.  Diabetes mellitus type 2, uncontrolled with hyperglycemia Continue glargine and SSI.  Holding metformin.  Obesity Estimated body mass  index is 34.12 kg/m as calculated from the following:   Height as of 12/15/22: 6\' 1"  (1.854 m).   Weight as of this encounter: 117.3 kg.  DVT Prophylaxis: Lovenox Code Status: Full code Family Communication: Discussed with patient Disposition Plan: Hopefully return home when improved.  Start mobilizing.     Medications: Scheduled:  amoxicillin  500 mg Oral Q8H   Chlorhexidine Gluconate Cloth  6 each Topical Daily   enoxaparin (LOVENOX) injection  40 mg Subcutaneous Q24H   insulin aspart  0-5 Units Subcutaneous QHS   insulin aspart  0-9 Units Subcutaneous TID WC   insulin glargine-yfgn  8 Units Subcutaneous Q24H   losartan  100 mg Oral Daily   rosuvastatin  10 mg Oral Daily   Continuous:   [id number]:[id number] **OR** acetaminophen, dextrose, HYDROmorphone (DILAUDID) injection, melatonin, oxyCODONE  Antibiotics: Anti-infectives (From admission, onward)    Start     Dose/Rate Route Frequency Ordered Stop   01/19/23 1700  amoxicillin (AMOXIL) capsule 500 mg        500 mg Oral Every 8 hours 01/19/23 1618 01/24/23 0559       Objective:  Vital Signs  Vitals:   01/22/23 0328 01/22/23 0449 01/22/23 0500 01/22/23 0814  BP:  138/81    Pulse:  64    Resp: 15 13  16   Temp:  98.4 F (36.9 C)    TempSrc:  Oral    SpO2: 97% 98%  98%  Weight:   117.3 kg     Intake/Output Summary (Last 24 hours) at 01/22/2023 0916 Last data filed at 01/22/2023 0846 Gross per 24 hour  Intake 5068.32 ml  Output 2900 ml  Net 2168.32 ml   Filed  Weights   01/19/23 0500 01/21/23 0500 01/22/23 0500  Weight: 112 kg 117.8 kg 117.3 kg    General appearance: Awake alert.  In no distress Resp: Clear to auscultation bilaterally.  Normal effort Cardio: S1-S2 is normal regular.  No S3-S4.  No rubs murmurs or bruit GI: Abdomen is soft.  Nontender nondistended.  Bowel sounds are present normal.  No masses organomegaly Extremities: No edema.  Full range of motion of lower extremities. Neurologic:  Alert and oriented x3.  No focal neurological deficits.    Lab Results:  Data Reviewed: I have personally reviewed following labs and reports of the imaging studies  CBC: Recent Labs  Lab 01/18/23 2105 01/19/23 0733 01/20/23 0538 01/21/23 0533  WBC 18.0* 17.7* 10.4 8.3  HGB 14.2 12.8* 10.6* 11.9*  HCT 40.0 36.8* 30.5* 35.7*  MCV 99.0 100.3* 101.3* 103.8*  PLT 280 253 194 237    Basic Metabolic Panel: Recent Labs  Lab 01/18/23 2356 01/19/23 0705 01/20/23 0538 01/21/23 0533 01/22/23 0609  NA 131* 133* 129* 136 136  K 3.9 3.3* 3.9 3.7 3.3*  CL 103 106 103 105 102  CO2 18* 17* 17* 23 23  GLUCOSE 381* 167* 146* 112* 148*  BUN 25* 18 8 7  <5*  CREATININE 1.27* 0.83 0.51* 0.63 0.56*  CALCIUM 11.7* 11.3* 8.6* 9.0 8.3*  MG  --  1.6*  --  1.5* 1.9  PHOS  --  2.9  --   --   --     GFR: Estimated Creatinine Clearance: 131.8 mL/min (A) (by C-G formula based on SCr of 0.56 mg/dL (L)).  Liver Function Tests: Recent Labs  Lab 01/18/23 2105 01/19/23 0705 01/21/23 0533 01/22/23 0609  AST 25 20 35 28  ALT 16 16 17 17   ALKPHOS 94 79 104 113  BILITOT 0.7 0.7 0.7 0.6  PROT 7.5 6.6 5.7* 6.0*  ALBUMIN 3.8 3.2* 2.9* 2.9*    Recent Labs  Lab 01/18/23 2105 01/19/23 0705 01/21/23 0533 01/22/23 0609  LIPASE 449* 193* 21 20     Cardiac Enzymes: Recent Labs  Lab 01/19/23 0705  CKTOTAL 34*    CBG: Recent Labs  Lab 01/21/23 0745 01/21/23 1133 01/21/23 1739 01/21/23 2124 01/22/23 0713  GLUCAP 119* 156* 137* 178* 154*     Recent Results (from the past 240 hour(s))  MRSA Next Gen by PCR, Nasal     Status: Abnormal   Collection Time: 01/19/23  3:28 AM   Specimen: Nasal Mucosa; Nasal Swab  Result Value Ref Range Status   MRSA by PCR Next Gen DETECTED (A) NOT DETECTED Final    Comment: CRITICAL RESULT CALLED TO, READ BACK BY AND VERIFIED WITH: SALAS B. @ 0524 01/19/23 MCLEAN K. (NOTE) The GeneXpert MRSA Assay (FDA approved for NASAL specimens only), is one  component of a comprehensive MRSA colonization surveillance program. It is not intended to diagnose MRSA infection nor to guide or monitor treatment for MRSA infections. Test performance is not FDA approved in patients less than 55 years old. Performed at [facility], [address] [place], [place] [address]       Radiology Studies: US Abdomen Limited RUQ (LIVER/GB)  Result Date: 01/22/2023 CLINICAL DATA:  Follow-up cholelithiasis EXAM: ULTRASOUND ABDOMEN LIMITED RIGHT UPPER QUADRANT COMPARISON:  CT from 01/19/2023 FINDINGS: Gallbladder: Gallbladder is well distended. No wall thickening or pericholecystic fluid is noted. Negative sonographic Murphy's sign is elicited. Previously seen gallstones are less well appreciated on this exam. Common bile duct: Diameter: 4  mm Liver: Increased in echogenicity consistent with fatty infiltration. No focal mass is noted. Portal vein is patent on color Doppler imaging with normal direction of blood flow towards the liver. Other: None. IMPRESSION: Fatty liver. Previously seen cholelithiasis is not as well appreciated on today's exam. Electronically Signed   By: Alcide Clever M.D.   On: 01/22/2023 00:06       LOS: 3 days   Doyl Bitting Rito Ehrlich  [facility] Pager on [email or web address]  01/22/2023, 9:16 AM

## 2023-01-23 DIAGNOSIS — K8500 Idiopathic acute pancreatitis without necrosis or infection: Secondary | ICD-10-CM | POA: Diagnosis not present

## 2023-01-23 LAB — GLUCOSE, CAPILLARY: Glucose-Capillary: 198 mg/dL — ABNORMAL HIGH (ref 70–99)

## 2023-01-23 MED ORDER — AMOXICILLIN 500 MG PO CAPS: 500.0000 mg | ORAL_CAPSULE | Freq: Three times a day (TID) | ORAL | 0 refills | Status: AC

## 2023-01-23 MED ORDER — OXYCODONE-ACETAMINOPHEN 5-325 MG PO TABS: 1.0000 | ORAL_TABLET | Freq: Four times a day (QID) | ORAL | 0 refills | Status: DC | PRN

## 2023-01-23 NOTE — Plan of Care (Signed)
  Problem: Education: Goal: Knowledge of General Education information will improve Description: Including pain rating scale, medication(s)/side effects and non-pharmacologic comfort measures Outcome: Progressing   Problem: Clinical Measurements: Goal: Ability to maintain clinical measurements within normal limits will improve Outcome: Progressing   Problem: Nutrition: Goal: Adequate nutrition will be maintained Outcome: Progressing   Problem: Pain Management: Goal: General experience of comfort will improve Outcome: Progressing   Problem: Safety: Goal: Ability to remain free from injury will improve Outcome: Progressing   Problem: Fluid Volume: Goal: Ability to maintain a balanced intake and output will improve Outcome: Progressing   Problem: Health Behavior/Discharge Planning: Goal: Ability to identify and utilize available resources and services will improve Outcome: Progressing

## 2023-01-23 NOTE — Discharge Summary (Signed)
[facility]  Physician Discharge Summary   Patient ID: Bryce Mendoza MRN: [id number] DOB/AGE: 09-Dec-1960 62 y.o.  Admit date: 01/18/2023 Discharge date: 01/23/2023    PCP: Claiborne Rigg, NP  DISCHARGE DIAGNOSES:    Acute pancreatitis   Hypokalemia   Hypomagnesemia   DM2 (diabetes mellitus, type 2) (HCC)   Benign essential HTN   Tooth infection   RECOMMENDATIONS FOR OUTPATIENT FOLLOW UP: Patient to follow-up with his outpatient providers  Home Health: None Equipment/Devices: None  CODE STATUS: Full code  DISCHARGE CONDITION: fair  Diet recommendation: Low-fat diet  INITIAL HISTORY: 62 y.o. M with DM, alcoholism in remission, hx necrotizing pancreatitis with pseudocyst in 2022, who presented with another pancreatitis flare.   HOSPITAL COURSE:   Acute pancreatitis/Pseudocysts CT shows stable findings for the most part.  Improvement noted in one pseudocyst while the other was noted to be slightly larger. Lipase was noted to be in the 400s.  Improvement noted in the levels.  Patient slowly started improving.  He was transitioned from PCA pump to as needed narcotics.  Diet was advanced.  Okay for discharge today.  Will be sent out on a few tablets of oxycodone for pain management at home.  He may continue his Creon.   Dental infection On amoxicillin.  Reports improvement in tooth pain.   Concern for cholelithiasis CT raise concern for tiny gallstones in the gallbladder neck.  No biliary ductal dilatation noted.  Bilirubin and alkaline phosphatase levels are normal.  Stones were not seen on previous imaging studies.  Ultrasound was done which also did not reveal any stones.  Findings discussed with patient.  No further testing for now.     Hyponatremia Improved.   Hypokalemia/hypomagnesemia   Macrocytic anemia Stable hemoglobin noted.  Outpatient workup.   Acute kidney injury Resolved.   Hypercalcemia Resolved.   Benign essential  hypertension Losartan being continued.  Monitor blood pressures.   History of pulmonary embolism No longer on anticoagulation.   Diabetes mellitus type 2, uncontrolled with hyperglycemia Continue glargine and SSI.  Holding metformin.   Obesity Estimated body mass index is 34.12 kg/m as calculated from the following:   Height as of 12/15/22: 6\' 1"  (1.854 m).   Weight as of this encounter: 117.3 kg.   Patient is stable.  Okay for discharge home today.  PERTINENT LABS:  The results of significant diagnostics from this hospitalization (including imaging, microbiology, ancillary and laboratory) are listed below for reference.    Microbiology: Recent Results (from the past 240 hour(s))  MRSA Next Gen by PCR, Nasal     Status: Abnormal   Collection Time: 01/19/23  3:28 AM   Specimen: Nasal Mucosa; Nasal Swab  Result Value Ref Range Status   MRSA by PCR Next Gen DETECTED (A) NOT DETECTED Final    Comment: CRITICAL RESULT CALLED TO, READ BACK BY AND VERIFIED WITH: SALAS B. @ 0524 01/19/23 MCLEAN K. (NOTE) The GeneXpert MRSA Assay (FDA approved for NASAL specimens only), is one component of a comprehensive MRSA colonization surveillance program. It is not intended to diagnose MRSA infection nor to guide or monitor treatment for MRSA infections. Test performance is not FDA approved in patients less than 23 years old. Performed at [facility], [address], [place], [place] [address]      Labs:   Basic Metabolic Panel: Recent Labs  Lab 01/18/23 2356 01/19/23 0705 01/20/23 0538 01/21/23 0533 01/22/23 0609  NA 131* 133* 129* 136 136  K 3.9 3.3*  3.9 3.7 3.3*  CL 103 106 103 105 102  CO2 18* 17* 17* 23 23  GLUCOSE 381* 167* 146* 112* 148*  BUN 25* 18 8 7  <5*  CREATININE 1.27* 0.83 0.51* 0.63 0.56*  CALCIUM 11.7* 11.3* 8.6* 9.0 8.3*  MG  --  1.6*  --  1.5* 1.9  PHOS  --  2.9  --   --   --    Liver Function Tests: Recent Labs  Lab 01/18/23 2105  01/19/23 0705 01/21/23 0533 01/22/23 0609  AST 25 20 35 28  ALT 16 16 17 17   ALKPHOS 94 79 104 113  BILITOT 0.7 0.7 0.7 0.6  PROT 7.5 6.6 5.7* 6.0*  ALBUMIN 3.8 3.2* 2.9* 2.9*   Recent Labs  Lab 01/18/23 2105 01/19/23 0705 01/21/23 0533 01/22/23 0609  LIPASE 449* 193* 21 20    CBC: Recent Labs  Lab 01/18/23 2105 01/19/23 0733 01/20/23 0538 01/21/23 0533  WBC 18.0* 17.7* 10.4 8.3  HGB 14.2 12.8* 10.6* 11.9*  HCT 40.0 36.8* 30.5* 35.7*  MCV 99.0 100.3* 101.3* 103.8*  PLT 280 253 194 237   Cardiac Enzymes: Recent Labs  Lab 01/19/23 0705  CKTOTAL 34*    CBG: Recent Labs  Lab 01/22/23 0713 01/22/23 1121 01/22/23 1621 01/22/23 2154 01/23/23 0818  GLUCAP 154* 240* 165* 257* 198*     IMAGING STUDIES US Abdomen Limited RUQ (LIVER/GB)  Result Date: 01/22/2023 CLINICAL DATA:  Follow-up cholelithiasis EXAM: ULTRASOUND ABDOMEN LIMITED RIGHT UPPER QUADRANT COMPARISON:  CT from 01/19/2023 FINDINGS: Gallbladder: Gallbladder is well distended. No wall thickening or pericholecystic fluid is noted. Negative sonographic Murphy's sign is elicited. Previously seen gallstones are less well appreciated on this exam. Common bile duct: Diameter: 4 mm Liver: Increased in echogenicity consistent with fatty infiltration. No focal mass is noted. Portal vein is patent on color Doppler imaging with normal direction of blood flow towards the liver. Other: None. IMPRESSION: Fatty liver. Previously seen cholelithiasis is not as well appreciated on today's exam. Electronically Signed   By: Alcide Clever M.D.   On: 01/22/2023 00:06   CT ABDOMEN PELVIS W CONTRAST  Result Date: 01/19/2023 CLINICAL DATA:  Inpatient. Abdominal pain, suspected acute pancreatitis. EXAM: CT ABDOMEN AND PELVIS WITH CONTRAST TECHNIQUE: Multidetector CT imaging of the abdomen and pelvis was performed using the standard protocol following bolus administration of intravenous contrast. RADIATION DOSE REDUCTION: This exam  was performed according to the departmental dose-optimization program which includes automated exposure control, adjustment of the mA and/or kV according to patient size and/or use of iterative reconstruction technique. CONTRAST:  OMNIPAQUE IOHEXOL 300 MG/ML  SOLN COMPARISON:  09/07/2022 CT abdomen/pelvis. FINDINGS: Lower chest: Chronic curvilinear parenchymal bands at both lung bases compatible with nonspecific scarring. No acute abnormality at the lung bases. Hepatobiliary: Normal liver size. No liver mass. Suggestion of tiny layering gallstones in the gallbladder neck. No gallbladder wall thickening or significant pericholecystic fluid. No biliary ductal dilatation. CBD diameter 3 mm. Pancreas: Generalized mild peripancreatic fat stranding compatible with acute pancreatitis. Tiny parenchymal calcification in the pancreatic head is unchanged. Chronic tiny 1.4 x 1.2 cm anterior pancreatic head pseudocyst (series 2/image 34), decreased from 2.7 x 1.9 cm on 09/07/2022 CT. Chronic irregular lobulated pancreatic body and tail 7.3 x 1.8 cm pseudocyst (series 2/image 27), previously 6.9 x 1.8 cm on 09/07/2022 CT using similar measurement technique, minimally increased. No acute interval peripancreatic fluid collections. No pancreatic duct dilation. Spleen: Normal size. No mass. Adrenals/Urinary Tract: Normal adrenals. No hydronephrosis. No  renal masses. Normal bladder. Stomach/Bowel: Normal non-distended stomach. Normal caliber small bowel with no small bowel wall thickening. Appendix not discretely visualized. Oral contrast transits to the right colon. Normal large bowel with no diverticulosis, large bowel wall thickening or pericolonic fat stranding. Vascular/Lymphatic: Atherosclerotic nonaneurysmal abdominal aorta. Patent portal, splenic, hepatic and renal veins. Mildly enlarged 1.9 cm portacaval node (series 2/image 27), stable from 09/07/2022 CT. No additional pathologically enlarged lymph nodes in the abdomen  or pelvis. Reproductive: Normal size prostate. Other: No pneumoperitoneum. Trace pelvic ascites. Small fat containing umbilical hernia. Musculoskeletal: No aggressive appearing focal osseous lesions. Mild thoracolumbar spondylosis. IMPRESSION: 1. Acute on chronic pancreatitis. 2. Chronic pancreatic pseudocysts, as detailed, minimally increased in the pancreatic body and tail and decreased in the anterior pancreatic head since 09/07/2022 CT. No new interval peripancreatic fluid collections. 3. Suggestion of tiny layering gallstones in the gallbladder neck. No CT evidence of acute cholecystitis. No biliary ductal dilatation. 4. Stable mildly enlarged portacaval lymph node, nonspecific, probably reactive. 5. Trace pelvic ascites. 6. Small fat containing umbilical hernia. 7.  Aortic Atherosclerosis (ICD10-I70.0). Electronically Signed   By: Delbert Phenix M.D.   On: 01/19/2023 14:36    DISCHARGE EXAMINATION: Vitals:   01/22/23 0814 01/22/23 1124 01/22/23 1927 01/23/23 0506  BP:  117/82 134/71 (!) 143/95  Pulse:  65 69 64  Resp: 16  18 15   Temp:  98.9 F (37.2 C) 98.4 F (36.9 C) 98.3 F (36.8 C)  TempSrc:   Oral Oral  SpO2: 98% 97% 100% 98%  Weight:    116 kg   General appearance: Awake alert.  In no distress Resp: Clear to auscultation bilaterally.  Normal effort Cardio: S1-S2 is normal regular.  No S3-S4.  No rubs murmurs or bruit GI: Abdomen is soft.  Nontender nondistended.  Bowel sounds are present normal.  No masses organomegaly Extremities: No edema.  Full range of motion of lower extremities. Neurologic: Alert and oriented x3.  No focal neurological deficits.    DISPOSITION: Home  Discharge Instructions     Call MD for:  difficulty breathing, headache or visual disturbances   Complete by: As directed    Call MD for:  extreme fatigue   Complete by: As directed    Call MD for:  persistant dizziness or light-headedness   Complete by: As directed    Call MD for:  persistant nausea and  vomiting   Complete by: As directed    Call MD for:  severe uncontrolled pain   Complete by: As directed    Call MD for:  temperature >100.4   Complete by: As directed    Diet Carb Modified   Complete by: As directed    Discharge instructions   Complete by: As directed    Please be sure to follow-up with a dentist for your tooth pain.  Take your medications as prescribed.  Seek attention if your symptoms recur.  You were cared for by a hospitalist during your hospital stay. If you have any questions about your discharge medications or the care you received while you were in the hospital after you are discharged, you can call the unit and asked to speak with the hospitalist on call if the hospitalist that took care of you is not available. Once you are discharged, your primary care physician will handle any further medical issues. Please note that NO REFILLS for any discharge medications will be authorized once you are discharged, as it is imperative that you return to your primary  care physician (or establish a relationship with a primary care physician if you do not have one) for your aftercare needs so that they can reassess your need for medications and monitor your lab values. If you do not have a primary care physician, you can call [phone] for a physician referral.   Increase activity slowly   Complete by: As directed          Allergies as of 01/23/2023       Reactions   Fentanyl Nausea And Vomiting   Morphine Other (See Comments)   Severe nausea        Medication List     TAKE these medications    amoxicillin 500 MG capsule Commonly known as: AMOXIL Take 1 capsule (500 mg total) by mouth every 8 (eight) hours for 2 days.   B-D ULTRAFINE III SHORT PEN 31G X 8 MM Misc Generic drug: Insulin Pen Needle Use to inject insulin once daily.   Blood Pressure Monitor Devi Please provide patient with insurance approved blood pressure monitor   dicyclomine 10 MG  capsule Commonly known as: BENTYL Take 1 capsule (10 mg total) by mouth 3 (three) times daily before meals. What changed: additional instructions   losartan 100 MG tablet Commonly known as: COZAAR Take 1 tablet (100 mg total) by mouth daily.   metFORMIN 500 MG 24 hr tablet Commonly known as: GLUCOPHAGE-XR TAKE 1 TABLET (500 MG TOTAL) BY MOUTH IN THE MORNING AND AT BEDTIME   multivitamin tablet Take 1 tablet by mouth daily.   oxyCODONE-acetaminophen 5-325 MG tablet Commonly known as: Percocet Take 1-2 tablets by mouth every 6 (six) hours as needed for severe pain (pain score 7-10).   Pancrelipase (Lip-Prot-Amyl) 24000-76000 units Cpep Take 1 capsule (24,000 Units total) by mouth 3 (three) times daily before meals.   rosuvastatin 10 MG tablet Commonly known as: CRESTOR Take 1 tablet (10 mg total) by mouth daily.   Toujeo SoloStar 300 UNIT/ML Solostar Pen Generic drug: insulin glargine (1 Unit Dial) Inject 15 Units into the skin daily.          Follow-up Information     Claiborne Rigg, NP. Schedule an appointment as soon as possible for a visit in 1 week(s).   Specialty: Nurse Practitioner Why: post hospitalization follow up Contact information: [address] [place] [address] [phone]                 TOTAL DISCHARGE TIME: 35 minutes  Dorethea Strubel Rito Ehrlich  [facility] Pager on [email or web address]  01/24/2023, 10:35 AM

## 2023-01-23 NOTE — Care Management Important Message (Signed)
Important Message  Patient Details IM Letter given. Name: Bryce Mendoza MRN: [id number] Date of Birth: 04-11-1960   Important Message Given:  Yes - Medicare IM     Caren Macadam 01/23/2023, 9:00 AM

## 2023-01-26 ENCOUNTER — Telehealth: Payer: Self-pay

## 2023-01-26 NOTE — Transitions of Care (Post Inpatient/ED Visit) (Signed)
   01/26/2023  Name: Bryce Mendoza MRN: [id number] DOB: 11-08-1960  Today's TOC FU Call Status: Today's TOC FU Call Status:: Unsuccessful Call (1st Attempt) Unsuccessful Call (1st Attempt) Date: 01/26/23  Attempted to reach the patient regarding the most recent Inpatient/ED visit.  Follow Up Plan: Additional outreach attempts will be made to reach the patient to complete the Transitions of Care (Post Inpatient/ED visit) call.   Alyse Low, RN, BA, [facility], CRRN [facility] Coordinator, Transition of Care Ph # [phone]

## 2023-01-27 ENCOUNTER — Telehealth: Payer: Self-pay

## 2023-01-27 NOTE — Transitions of Care (Post Inpatient/ED Visit) (Signed)
   01/27/2023  Name: Bryce Mendoza MRN: [id number] DOB: 01/22/1961  Today's TOC FU Call Status: Today's TOC FU Call Status:: Unsuccessful Call (2nd Attempt) Unsuccessful Call (2nd Attempt) Date: 01/27/23  Attempted to reach the patient regarding the most recent Inpatient/ED visit.  Follow Up Plan: Additional outreach attempts will be made to reach the patient to complete the Transitions of Care (Post Inpatient/ED visit) call.   Alyse Low, RN, BA, [facility], CRRN [facility] Coordinator, Transition of Care Ph # [phone]

## 2023-01-28 ENCOUNTER — Telehealth: Payer: Self-pay

## 2023-01-28 NOTE — Transitions of Care (Post Inpatient/ED Visit) (Signed)
   01/28/2023  Name: Brantson Dulac MRN: [id number] DOB: [birth year]  Today's TOC FU Call Status: Today's TOC FU Call Status:: Unsuccessful Call (3rd Attempt) Unsuccessful Call (3rd Attempt) Date: 01/28/23  Attempted to reach the patient regarding the most recent Inpatient/ED visit.  Follow Up Plan: No further outreach attempts will be made at this time. We have been unable to contact the patient.  Alyse Low, RN, BA, [facility], CRRN [facility] Coordinator, Transition of Care Ph # [phone]

## 2023-02-19 ENCOUNTER — Other Ambulatory Visit: Payer: Self-pay | Admitting: Family Medicine

## 2023-02-22 ENCOUNTER — Inpatient Hospital Stay (HOSPITAL_COMMUNITY)
Admission: EM | Admit: 2023-02-22 | Discharge: 2023-02-28 | DRG: 637 | Disposition: A | Discharge status: Home / Self Care | Payer: 59 | Attending: Internal Medicine | Admitting: Internal Medicine

## 2023-02-22 ENCOUNTER — Observation Stay (HOSPITAL_COMMUNITY): Payer: 59

## 2023-02-22 ENCOUNTER — Encounter (HOSPITAL_COMMUNITY): Payer: Self-pay | Admitting: Emergency Medicine

## 2023-02-22 ENCOUNTER — Other Ambulatory Visit: Payer: Self-pay

## 2023-02-22 DIAGNOSIS — E669 Obesity, unspecified: Secondary | ICD-10-CM | POA: Diagnosis present

## 2023-02-22 DIAGNOSIS — E1010 Type 1 diabetes mellitus with ketoacidosis without coma: Secondary | ICD-10-CM | POA: Diagnosis not present

## 2023-02-22 DIAGNOSIS — E876 Hypokalemia: Secondary | ICD-10-CM | POA: Diagnosis present

## 2023-02-22 DIAGNOSIS — D72829 Elevated white blood cell count, unspecified: Secondary | ICD-10-CM | POA: Diagnosis present

## 2023-02-22 DIAGNOSIS — R0689 Other abnormalities of breathing: Secondary | ICD-10-CM | POA: Diagnosis not present

## 2023-02-22 DIAGNOSIS — E1110 Type 2 diabetes mellitus with ketoacidosis without coma: Secondary | ICD-10-CM | POA: Diagnosis not present

## 2023-02-22 DIAGNOSIS — K5900 Constipation, unspecified: Secondary | ICD-10-CM | POA: Diagnosis not present

## 2023-02-22 DIAGNOSIS — R109 Unspecified abdominal pain: Secondary | ICD-10-CM | POA: Diagnosis present

## 2023-02-22 DIAGNOSIS — K219 Gastro-esophageal reflux disease without esophagitis: Secondary | ICD-10-CM | POA: Diagnosis not present

## 2023-02-22 DIAGNOSIS — Z7984 Long term (current) use of oral hypoglycemic drugs: Secondary | ICD-10-CM

## 2023-02-22 DIAGNOSIS — I10 Essential (primary) hypertension: Secondary | ICD-10-CM | POA: Diagnosis not present

## 2023-02-22 DIAGNOSIS — K859 Acute pancreatitis without necrosis or infection, unspecified: Secondary | ICD-10-CM | POA: Diagnosis present

## 2023-02-22 DIAGNOSIS — D638 Anemia in other chronic diseases classified elsewhere: Secondary | ICD-10-CM | POA: Diagnosis not present

## 2023-02-22 DIAGNOSIS — R1012 Left upper quadrant pain: Secondary | ICD-10-CM | POA: Diagnosis not present

## 2023-02-22 DIAGNOSIS — E785 Hyperlipidemia, unspecified: Secondary | ICD-10-CM | POA: Diagnosis not present

## 2023-02-22 DIAGNOSIS — Z794 Long term (current) use of insulin: Secondary | ICD-10-CM

## 2023-02-22 DIAGNOSIS — E860 Dehydration: Secondary | ICD-10-CM | POA: Diagnosis present

## 2023-02-22 DIAGNOSIS — K861 Other chronic pancreatitis: Secondary | ICD-10-CM | POA: Diagnosis not present

## 2023-02-22 DIAGNOSIS — R1111 Vomiting without nausea: Secondary | ICD-10-CM | POA: Diagnosis not present

## 2023-02-22 DIAGNOSIS — N179 Acute kidney failure, unspecified: Secondary | ICD-10-CM | POA: Diagnosis present

## 2023-02-22 DIAGNOSIS — R1013 Epigastric pain: Secondary | ICD-10-CM | POA: Diagnosis not present

## 2023-02-22 DIAGNOSIS — Z79899 Other long term (current) drug therapy: Secondary | ICD-10-CM | POA: Diagnosis not present

## 2023-02-22 DIAGNOSIS — Z807 Family history of other malignant neoplasms of lymphoid, hematopoietic and related tissues: Secondary | ICD-10-CM | POA: Diagnosis not present

## 2023-02-22 DIAGNOSIS — R112 Nausea with vomiting, unspecified: Secondary | ICD-10-CM | POA: Diagnosis not present

## 2023-02-22 DIAGNOSIS — Z885 Allergy status to narcotic agent status: Secondary | ICD-10-CM

## 2023-02-22 DIAGNOSIS — Z86711 Personal history of pulmonary embolism: Secondary | ICD-10-CM | POA: Diagnosis not present

## 2023-02-22 DIAGNOSIS — Z743 Need for continuous supervision: Secondary | ICD-10-CM | POA: Diagnosis not present

## 2023-02-22 DIAGNOSIS — K8689 Other specified diseases of pancreas: Secondary | ICD-10-CM | POA: Diagnosis not present

## 2023-02-22 DIAGNOSIS — F17210 Nicotine dependence, cigarettes, uncomplicated: Secondary | ICD-10-CM | POA: Diagnosis not present

## 2023-02-22 DIAGNOSIS — K863 Pseudocyst of pancreas: Secondary | ICD-10-CM | POA: Diagnosis not present

## 2023-02-22 LAB — URINALYSIS, ROUTINE W REFLEX MICROSCOPIC
Bacteria, UA: NONE SEEN
Bilirubin Urine: NEGATIVE
Glucose, UA: >=500 mg/dL — AB
Hgb urine dipstick: NEGATIVE
Ketones, ur: 80 mg/dL — AB
Leukocytes,Ua: NEGATIVE
Nitrite: NEGATIVE
Protein, ur: 30 mg/dL — AB
Specific Gravity, Urine: 1.021 (ref 1.005–1.030)
pH: 5 (ref 5.0–8.0)

## 2023-02-22 LAB — COMPREHENSIVE METABOLIC PANEL
ALT: 13 U/L (ref 0–44)
AST: 19 U/L (ref 15–41)
Albumin: 4 g/dL (ref 3.5–5.0)
Alkaline Phosphatase: 82 U/L (ref 38–126)
Anion gap: 24 — ABNORMAL HIGH (ref 5–15)
BUN: 35 mg/dL — ABNORMAL HIGH (ref 6–20)
CO2: 9 mmol/L — ABNORMAL LOW (ref 22–32)
Calcium: 9.3 mg/dL (ref 8.9–10.3)
Chloride: 95 mmol/L — ABNORMAL LOW (ref 98–111)
Creatinine, Ser: 1.64 mg/dL — ABNORMAL HIGH (ref 0.61–1.24)
GFR, Estimated: 48 mL/min — ABNORMAL LOW (ref >60)
Glucose, Bld: 577 mg/dL (ref 70–99)
Potassium: 4.5 mmol/L (ref 3.5–5.1)
Sodium: 128 mmol/L — ABNORMAL LOW (ref 135–145)
Total Bilirubin: 1.8 mg/dL — ABNORMAL HIGH (ref <1.2)
Total Protein: 7.9 g/dL (ref 6.5–8.1)

## 2023-02-22 LAB — SODIUM, URINE, RANDOM: Sodium, Ur: 19 mmol/L

## 2023-02-22 LAB — CBG MONITORING, ED
Glucose-Capillary: 480 mg/dL — ABNORMAL HIGH (ref 70–99)
Glucose-Capillary: 526 mg/dL (ref 70–99)
Glucose-Capillary: 426 mg/dL — ABNORMAL HIGH (ref 70–99)

## 2023-02-22 LAB — CBC
HCT: 44.2 % (ref 39.0–52.0)
Hemoglobin: 15.3 g/dL (ref 13.0–17.0)
MCH: 33.3 pg (ref 26.0–34.0)
MCHC: 34.6 g/dL (ref 30.0–36.0)
MCV: 96.3 fL (ref 80.0–100.0)
Platelets: 352 10*3/uL (ref 150–400)
RBC: 4.59 MIL/uL (ref 4.22–5.81)
RDW: 12 % (ref 11.5–15.5)
WBC: 21.3 10*3/uL — ABNORMAL HIGH (ref 4.0–10.5)
nRBC: 0 % (ref 0.0–0.2)

## 2023-02-22 LAB — BLOOD GAS, VENOUS
Acid-base deficit: 19.1 mmol/L — ABNORMAL HIGH (ref 0.0–2.0)
Bicarbonate: 8.8 mmol/L — ABNORMAL LOW (ref 20.0–28.0)
O2 Saturation: 40.3 %
Patient temperature: 36.1
pCO2, Ven: 26 mm[Hg] — ABNORMAL LOW (ref 44–60)
pH, Ven: 7.13 — CL (ref 7.25–7.43)
pO2, Ven: <31 mm[Hg] — CL (ref 32–45)

## 2023-02-22 LAB — GLUCOSE, CAPILLARY: Glucose-Capillary: 422 mg/dL — ABNORMAL HIGH (ref 70–99)

## 2023-02-22 LAB — RAPID URINE DRUG SCREEN, HOSP PERFORMED
Amphetamines: NOT DETECTED
Barbiturates: NOT DETECTED
Benzodiazepines: NOT DETECTED
Cocaine: NOT DETECTED
Opiates: NOT DETECTED
Tetrahydrocannabinol: NOT DETECTED

## 2023-02-22 LAB — RETICULOCYTES
Immature Retic Fract: 15.1 % (ref 2.3–15.9)
RBC.: 3.97 MIL/uL — ABNORMAL LOW (ref 4.22–5.81)
Retic Count, Absolute: 73 10*3/uL (ref 19.0–186.0)
Retic Ct Pct: 1.8 % (ref 0.4–3.1)

## 2023-02-22 LAB — LIPASE, BLOOD: Lipase: 173 U/L — ABNORMAL HIGH (ref 11–51)

## 2023-02-22 LAB — CREATININE, URINE, RANDOM: Creatinine, Urine: 76 mg/dL

## 2023-02-22 MED ORDER — IOHEXOL 300 MG/ML  SOLN
100.0000 mL | Freq: Once | INTRAMUSCULAR | Status: AC | PRN
  Administered 2023-02-22: 100 mL via INTRAVENOUS

## 2023-02-22 MED ORDER — HYDROMORPHONE HCL 1 MG/ML IJ SOLN
1.0000 mg | Freq: Once | INTRAMUSCULAR | Status: AC
Start: 2023-02-22 — End: 2023-02-22
  Administered 2023-02-22: 1 mg via INTRAVENOUS
  Filled 2023-02-22: qty 1

## 2023-02-22 MED ORDER — INSULIN REGULAR(HUMAN) IN NACL 100-0.9 UT/100ML-% IV SOLN
INTRAVENOUS | Status: AC
  Administered 2023-02-22: 9.5 [IU]/h via INTRAVENOUS
  Administered 2023-02-23: 2.2 [IU]/h via INTRAVENOUS
  Filled 2023-02-22 (×2): qty 100

## 2023-02-22 MED ORDER — HYDROMORPHONE HCL 1 MG/ML IJ SOLN
1.0000 mg | INTRAMUSCULAR | Status: DC | PRN
  Administered 2023-02-22 – 2023-02-27 (×30): 1 mg via INTRAVENOUS
  Filled 2023-02-22 (×32): qty 1

## 2023-02-22 MED ORDER — DEXTROSE 50 % IV SOLN: 0.0000 mL | INTRAVENOUS | Status: DC | PRN

## 2023-02-22 MED ORDER — DEXTROSE IN LACTATED RINGERS 5 % IV SOLN: INTRAVENOUS | Status: DC

## 2023-02-22 MED ORDER — LACTATED RINGERS IV SOLN: INTRAVENOUS | Status: AC

## 2023-02-22 MED ORDER — CHLORHEXIDINE GLUCONATE CLOTH 2 % EX PADS
6.0000 | MEDICATED_PAD | Freq: Every day | CUTANEOUS | Status: DC
  Administered 2023-02-23: 6 via TOPICAL

## 2023-02-22 MED ORDER — POTASSIUM CHLORIDE 10 MEQ/100ML IV SOLN
10.0000 meq | INTRAVENOUS | Status: AC
  Administered 2023-02-22 (×2): 10 meq via INTRAVENOUS
  Filled 2023-02-22 (×2): qty 100

## 2023-02-22 MED ORDER — SODIUM CHLORIDE 0.9 % IV BOLUS
1000.0000 mL | Freq: Once | INTRAVENOUS | Status: AC
  Administered 2023-02-22: 1000 mL via INTRAVENOUS

## 2023-02-22 MED ORDER — HYDROMORPHONE HCL 1 MG/ML IJ SOLN
1.0000 mg | Freq: Once | INTRAMUSCULAR | Status: AC
  Administered 2023-02-22: 1 mg via INTRAVENOUS
  Filled 2023-02-22: qty 1

## 2023-02-22 MED ORDER — LACTATED RINGERS IV BOLUS
1000.0000 mL | Freq: Once | INTRAVENOUS | Status: AC
  Administered 2023-02-22: 1000 mL via INTRAVENOUS

## 2023-02-22 MED ORDER — ONDANSETRON HCL 4 MG/2ML IJ SOLN
4.0000 mg | Freq: Once | INTRAMUSCULAR | Status: AC
  Administered 2023-02-22: 4 mg via INTRAVENOUS
  Filled 2023-02-22: qty 2

## 2023-02-22 NOTE — Assessment & Plan Note (Signed)
 CT abdomen pending

## 2023-02-22 NOTE — Assessment & Plan Note (Signed)
Currently no longer on anticoagulation
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
## 2023-02-22 NOTE — ED Provider Notes (Signed)
[place] EMERGENCY DEPARTMENT AT [facility] Provider Note   CSN: [id number] Arrival date & time: 02/22/23  1909     History  Chief Complaint  Patient presents with   Abdominal Pain    62 y/o male comes in c/o LUQ abdominal pain for the past 6 days. PT reports, nausea, vomiting and sob with a hx of non-alcoholic pancreatitis reporting "10" admissions in the last year with similar complaints.     Bryce Mendoza is a 62 y.o. male.   Abdominal Pain Patient presents abdominal pain.  Nausea vomiting for the last 6 days.  States similar to previous pancreatitis.  Decreased bowel movements.  Sugars high.  States unable to keep anything down.  Pain in upper abdomen.     Home Medications Prior to Admission medications   Medication Sig Start Date End Date Taking? Authorizing Provider  insulin glargine, 1 Unit Dial, (TOUJEO SOLOSTAR) 300 UNIT/ML Solostar Pen INJECT 15 UNITS INTO THE SKIN EVERY DAY 02/19/23  Yes Hoy Register, MD  lipase/protease/amylase 24000-76000 units CPEP Take 1 capsule (24,000 Units total) by mouth 3 (three) times daily before meals. 09/10/22  Yes Dahal, Melina Schools, MD  losartan (COZAAR) 100 MG tablet Take 1 tablet (100 mg total) by mouth daily. 01/12/23  Yes Claiborne Rigg, NP  metFORMIN (GLUCOPHAGE-XR) 500 MG 24 hr tablet TAKE 1 TABLET (500 MG TOTAL) BY MOUTH IN THE MORNING AND AT BEDTIME 11/04/22  Yes Claiborne Rigg, NP  Multiple Vitamin (MULTIVITAMIN) tablet Take 1 tablet by mouth daily.   Yes [provider]  rosuvastatin (CRESTOR) 10 MG tablet Take 1 tablet (10 mg total) by mouth daily. 12/24/22  Yes Hoy Register, MD  Blood Pressure Monitor DEVI Please provide patient with insurance approved blood pressure monitor 12/15/22   Claiborne Rigg, NP  Insulin Pen Needle (B-D ULTRAFINE III SHORT PEN) 31G X 8 MM MISC Use to inject insulin once daily. 12/24/22   Hoy Register, MD      Allergies    Fentanyl and Morphine    Review of Systems    Review of Systems  Gastrointestinal:  Positive for abdominal pain.    Physical Exam Updated Vital Signs BP (!) 149/88 (BP Location: Left Arm)   Pulse 94   Temp 98.1 F (36.7 C) (Oral)   Resp (!) 22   Ht 6\' 1"  (1.854 m)   Wt 113.4 kg   SpO2 100%   BMI 32.98 kg/m  Physical Exam Nursing note reviewed.  Constitutional:      Appearance: He is well-developed.  Cardiovascular:     Rate and Rhythm: Normal rate.  Abdominal:     Tenderness: There is abdominal tenderness.     Hernia: No hernia is present.     Comments: Mild abdominal tenderness no rebound or guarding.  No hernia palpated.  Neurological:     Mental Status: He is alert.     ED Results / Procedures / Treatments   Labs (all labs ordered are listed, but only abnormal results are displayed) Labs Reviewed  CBC - Abnormal; Notable for the following components:      Result Value   WBC 21.3 (*)    All other components within normal limits  URINALYSIS, ROUTINE W REFLEX MICROSCOPIC - Abnormal; Notable for the following components:   Glucose, UA >=500 (*)    Ketones, ur 80 (*)    Protein, ur 30 (*)    All other components within normal limits  COMPREHENSIVE METABOLIC PANEL - Abnormal; Notable  for the following components:   Sodium 128 (*)    Chloride 95 (*)    CO2 9 (*)    Glucose, Bld 577 (*)    BUN 35 (*)    Creatinine, Ser 1.64 (*)    Total Bilirubin 1.8 (*)    GFR, Estimated 48 (*)    Anion gap 24 (*)    All other components within normal limits  LIPASE, BLOOD - Abnormal; Notable for the following components:   Lipase 173 (*)    All other components within normal limits  BLOOD GAS, VENOUS - Abnormal; Notable for the following components:   pH, Ven 7.13 (*)    pCO2, Ven 26 (*)    pO2, Ven <31 (*)    Bicarbonate 8.8 (*)    Acid-base deficit 19.1 (*)    All other components within normal limits  CBG MONITORING, ED - Abnormal; Notable for the following components:   Glucose-Capillary 480 (*)    All other  components within normal limits  CBG MONITORING, ED - Abnormal; Notable for the following components:   Glucose-Capillary 526 (*)    All other components within normal limits  CBG MONITORING, ED - Abnormal; Notable for the following components:   Glucose-Capillary 426 (*)    All other components within normal limits  MRSA NEXT GEN BY PCR, NASAL  RAPID URINE DRUG SCREEN, HOSP PERFORMED  CREATININE, URINE, RANDOM  SODIUM, URINE, RANDOM  BASIC METABOLIC PANEL  BASIC METABOLIC PANEL  BASIC METABOLIC PANEL  BASIC METABOLIC PANEL  BETA-HYDROXYBUTYRIC ACID  BETA-HYDROXYBUTYRIC ACID  VITAMIN B12  FOLATE  IRON AND TIBC  FERRITIN  RETICULOCYTES  ETHANOL  PROCALCITONIN  VITAMIN B1  CK  MAGNESIUM  PHOSPHORUS  OSMOLALITY, URINE  OSMOLALITY  TSH  URINALYSIS, COMPLETE (UACMP) WITH MICROSCOPIC  PREALBUMIN  HEPATIC FUNCTION PANEL  CBC  LIPASE, BLOOD  LIPID PANEL  I-STAT CG4 LACTIC ACID, ED  TROPONIN I (HIGH SENSITIVITY)    EKG None  Radiology No results found.  Procedures Procedures    Medications Ordered in ED Medications  insulin regular, human (MYXREDLIN) 100 units/ 100 mL infusion (8.5 Units/hr Intravenous Infusion Verify 02/22/23 2312)  lactated ringers infusion (has no administration in time range)  dextrose 5 % in lactated ringers infusion (has no administration in time range)  dextrose 50 % solution 0-50 mL (has no administration in time range)  potassium chloride 10 mEq in 100 mL IVPB (10 mEq Intravenous New Bag/Given 02/22/23 2217)  Chlorhexidine Gluconate Cloth 2 % PADS 6 each (has no administration in time range)  HYDROmorphone (DILAUDID) injection 1 mg (has no administration in time range)  sodium chloride 0.9 % bolus 1,000 mL (0 mLs Intravenous Stopped 02/22/23 2148)  ondansetron (ZOFRAN) injection 4 mg (4 mg Intravenous Given 02/22/23 2042)  HYDROmorphone (DILAUDID) injection 1 mg (1 mg Intravenous Given 02/22/23 2056)  lactated ringers bolus 1,000 mL  (1,000 mLs Intravenous New Bag/Given 02/22/23 2216)  HYDROmorphone (DILAUDID) injection 1 mg (1 mg Intravenous Given 02/22/23 2220)  iohexol (OMNIPAQUE) 300 MG/ML solution 100 mL (100 mLs Intravenous Contrast Given 02/22/23 2251)    ED Course/ Medical Decision Making/ A&P                                 Medical Decision Making Amount and/or Complexity of Data Reviewed Labs: ordered. Radiology: ordered.  Risk Prescription drug management. Decision regarding hospitalization.   Patient abdominal pain nausea and vomiting.  History of same with pancreatitis but also history of similar symptoms with DKA.  Sugars elevated.  Will get basic blood work.  I reviewed previous discharge summaries and previous CT scan.  Does have apparent DKA with pH of 7.1 bicarb of 9.  Fluid bolus given.  Will start insulin.  White count is elevated but I think likely secondary to the DKA.  Lipase mildly elevated at 170.  CRITICAL CARE Performed by: Benjiman Core Total critical care time: 30 minutes Critical care time was exclusive of separately billable procedures and treating other patients. Critical care was necessary to treat or prevent imminent or life-threatening deterioration. Critical care was time spent personally by me on the following activities: development of treatment plan with patient and/or surrogate as well as nursing, discussions with consultants, evaluation of patient's response to treatment, examination of patient, obtaining history from patient or surrogate, ordering and performing treatments and interventions, ordering and review of laboratory studies, ordering and review of radiographic studies, pulse oximetry and re-evaluation of patient's condition.  Discussed with Dr. Adela Glimpse.  Requested I order CT scan.  It was ordered.  Will admit.         Final Clinical Impression(s) / ED Diagnoses Final diagnoses:  Diabetic ketoacidosis without coma associated with type 1 diabetes mellitus  (HCC)  Acute pancreatitis, unspecified complication status, unspecified pancreatitis type    Rx / DC Orders ED Discharge Orders     None         Benjiman Core, MD 02/22/23 2319

## 2023-02-22 NOTE — Assessment & Plan Note (Signed)
will admit per DKA  protocol, obtain serial BMET, start on glucosestabalizer, aggressive IVF.   work up of possible causes of DKA/HSS with CXR, ECG one set of cardiac enzymes, UA.  Monitor in [place]. Replace potassium magnesium and phosphate as needed.    Consult diabetes coordinator

## 2023-02-22 NOTE — ED Notes (Signed)
.ED TO INPATIENT HANDOFF REPORT  Name/Age/Gender Bryce Mendoza 62 y.o. male  Code Status Code Status History     Date Active Date Inactive Code Status Order ID Comments User Context   01/19/2023 0207 01/23/2023 1450 Full Code [id number]  Angie Fava, DO ED   09/06/2022 2101 09/10/2022 1830 Full Code [id number]  Orland Mustard, MD ED   09/19/2021 0535 09/22/2021 1919 Full Code [id number]  Hillary Bow, DO ED   03/21/2021 2146 03/24/2021 1727 Full Code [id number]  Jacques Navy, MD ED   12/02/2020 0242 12/14/2020 2132 Full Code [id number]  Hillary Bow, DO ED   11/15/2020 1457 11/17/2020 2322 DNR [id number]  Teddy Spike, DO Inpatient   10/30/2020 1754 11/04/2020 1624 Full Code [id number]  Noralee Stain, DO ED   10/17/2020 1638 10/21/2020 1815 Full Code [id number]  Rhetta Mura, MD ED   09/18/2020 0652 10/09/2020 2102 DNR [id number]  Marinda Elk, MD Inpatient   06/17/2019 0959 06/22/2019 2200 DNR [id number]  Tyrone Nine, MD Inpatient   06/15/2019 0801 06/17/2019 0959 Full Code [id number]  Marinda Elk, MD ED   09/08/2017 1620 09/14/2017 1758 Full Code [id number]  Clydie Braun, MD ED   08/04/2017 1825 08/15/2017 1634 Full Code [id number]  Narda Bonds, MD Inpatient   08/04/2017 1716 08/04/2017 1825 DNR [id number]  Narda Bonds, MD Inpatient    Questions for Most Recent Historical Code Status (Order [id number])     Question Answer   By: Consent: discussion documented in EHR            Home/SNF/Other Home  Chief Complaint DKA, type 2 (HCC) [E11.10]  Level of Care/Admitting Diagnosis ED Disposition     ED Disposition  Admit   Condition  --   Comment  Hospital Area: [facility] [[id number]]  Level of Care: Stepdown [14]  Admit to SDU based on following criteria: Hemodynamic compromise or significant risk of instability:  Patient requiring short term acute titration and management of vasoactive drips, and invasive monitoring (i.e., CVP and Arterial  line).  May place patient in observation at [facility] or [facility] if equivalent level of care is available:: No  Covid Evaluation: Asymptomatic - no recent exposure (last 10 days) testing not required  Diagnosis: DKA, type 2 [facility]) [[id number]]  Admitting Physician: Therisa Doyne [[id number]]  Attending Physician: Therisa Doyne [[id number]]          Medical History Past Medical History:  Diagnosis Date   Ascending paralysis (HCC) 06/2019   DM2 (diabetes mellitus, type 2) (HCC)    DVT (deep venous thrombosis) (HCC) 09/09/2017   Empyema lung (HCC)    Pulmonary embolism (HCC) 09/08/2017    Allergies Allergies  Allergen Reactions   Fentanyl Nausea And Vomiting   Morphine Other (See Comments)    Severe nausea    IV Location/Drains/Wounds Patient Lines/Drains/Airways Status     Active Line/Drains/Airways     Name Placement date Placement time Site Days   Peripheral IV 02/22/23 20 G Anterior;Proximal;Right Forearm 02/22/23  1934  Forearm  less than 1   Peripheral IV 02/22/23 20 G Left Antecubital 02/22/23  2210  Antecubital  less than 1            Labs/Imaging Results for orders placed or performed during the hospital encounter of 02/22/23 (from the past 48 hours)  CBC     Status: Abnormal   Collection Time: 02/22/23  7:28 PM  Result Value Ref Range   WBC  21.3 (H) 4.0 - 10.5 K/uL   RBC 4.59 4.22 - 5.81 MIL/uL   Hemoglobin 15.3 13.0 - 17.0 g/dL   HCT 16.1 09.6 - 04.5 %   MCV 96.3 80.0 - 100.0 fL   MCH 33.3 26.0 - 34.0 pg   MCHC 34.6 30.0 - 36.0 g/dL   RDW 40.9 81.1 - 91.4 %   Platelets 352 150 - 400 K/uL   nRBC 0.0 0.0 - 0.2 %    Comment: Performed at [facility], [address], [place], [place] [address]  Comprehensive metabolic panel     Status: Abnormal   Collection Time: 02/22/23  8:10 PM  Result Value Ref Range   Sodium 128 (L) 135 - 145 mmol/L    Comment: ELECTROLYTES REPEATED TO VERIFY   Potassium 4.5 3.5 - 5.1 mmol/L    Comment:  ELECTROLYTES REPEATED TO VERIFY   Chloride 95 (L) 98 - 111 mmol/L    Comment: ELECTROLYTES REPEATED TO VERIFY   CO2 9 (L) 22 - 32 mmol/L    Comment: ELECTROLYTES REPEATED TO VERIFY   Glucose, Bld 577 (HH) 70 - 99 mg/dL    Comment: CRITICAL RESULT CALLED TO, READ BACK BY AND VERIFIED WITH Ewa Gentry, H RN @ 2119 02/22/23. GILBERTL Glucose reference range applies only to samples taken after fasting for at least 8 hours.    BUN 35 (H) 6 - 20 mg/dL   Creatinine, Ser 6.21 (H) 0.61 - 1.24 mg/dL   Calcium 9.3 8.9 - 30.8 mg/dL   Total Protein 7.9 6.5 - 8.1 g/dL   Albumin 4.0 3.5 - 5.0 g/dL   AST 19 15 - 41 U/L   ALT 13 0 - 44 U/L   Alkaline Phosphatase 82 38 - 126 U/L   Total Bilirubin 1.8 (H) <1.2 mg/dL   GFR, Estimated 48 (L) >60 mL/min    Comment: (NOTE) Calculated using the CKD-EPI Creatinine Equation (2021)    Anion gap 24 (H) 5 - 15    Comment: ELECTROLYTES REPEATED TO VERIFY Performed at [facility], [address] [place], [place] [address]   Lipase, blood     Status: Abnormal   Collection Time: 02/22/23  8:10 PM  Result Value Ref Range   Lipase 173 (H) 11 - 51 U/L    Comment: Performed at [facility], [address], [place], [place] [address]  Blood gas, venous     Status: Abnormal   Collection Time: 02/22/23  8:38 PM  Result Value Ref Range   pH, Ven 7.13 (LL) 7.25 - 7.43    Comment: CRITICAL RESULT CALLED TO, READ BACK BY AND VERIFIED WITH: Letta Pate RN @ 2133 02/22/23. GILBERTL    pCO2, Ven 26 (L) 44 - 60 mmHg   pO2, Ven <31 (LL) 32 - 45 mmHg    Comment: CRITICAL RESULT CALLED TO, READ BACK BY AND VERIFIED WITH: Letta Pate RN @ 2133 02/22/23. GILBERTL    Bicarbonate 8.8 (L) 20.0 - 28.0 mmol/L   Acid-base deficit 19.1 (H) 0.0 - 2.0 mmol/L   O2 Saturation 40.3 %   Patient temperature 36.1     Comment: Performed at [facility], [address], [place], [place] [address]  CBG monitoring, ED     Status: Abnormal    Collection Time: 02/22/23  8:47 PM  Result Value Ref Range   Glucose-Capillary 480 (H) 70 - 99 mg/dL    Comment: Glucose reference range applies only to samples taken after  fasting for at least 8 hours.  CBG monitoring, ED     Status: Abnormal   Collection Time: 02/22/23 10:03 PM  Result Value Ref Range   Glucose-Capillary 526 (HH) 70 - 99 mg/dL    Comment: Glucose reference range applies only to samples taken after fasting for at least 8 hours.   Comment 1 Notify RN    No results found.  Pending Labs Unresulted Labs (From admission, onward)     Start     Ordered   02/23/23 0500  Vitamin B12  (Anemia Panel (PNL))  Tomorrow morning,   R        02/22/23 2234   02/23/23 0500  Folate  (Anemia Panel (PNL))  Tomorrow morning,   R        02/22/23 2234   02/23/23 0500  Prealbumin  Tomorrow morning,   R        02/22/23 2234   02/22/23 2235  Iron and TIBC  (Anemia Panel (PNL))  Add-on,   AD        02/22/23 2234   02/22/23 2235  Ferritin  (Anemia Panel (PNL))  Add-on,   AD        02/22/23 2234   02/22/23 2235  Reticulocytes  (Anemia Panel (PNL))  Add-on,   AD        02/22/23 2234   02/22/23 2235  Rapid urine drug screen (hospital performed)  ONCE - STAT,   STAT        02/22/23 2234   02/22/23 2235  Ethanol  Add-on,   AD        02/22/23 2234   02/22/23 2235  Procalcitonin  Add-on,   AD       References:    Procalcitonin Lower Respiratory Tract Infection AND Sepsis Procalcitonin Algorithm   02/22/23 2234   02/22/23 2235  Vitamin B1  Add-on,   AD        02/22/23 2234   02/22/23 2235  CK  Add-on,   AD        02/22/23 2234   02/22/23 2235  Magnesium  Add-on,   AD        02/22/23 2234   02/22/23 2235  Phosphorus  Add-on,   AD        02/22/23 2234   02/22/23 2235  Osmolality, urine  Once,   R        02/22/23 2234   02/22/23 2235  Osmolality  Add-on,   AD        02/22/23 2234   02/22/23 2235  Creatinine, urine, random  Once,   R        02/22/23 2234   02/22/23 2235  Sodium, urine,  random  Once,   R        02/22/23 2234   02/22/23 2235  TSH  Add-on,   AD        02/22/23 2234   02/22/23 2235  Urinalysis, Complete w Microscopic -Urine, Clean Catch  Once,   R       Question Answer Comment  Release to patient Immediate   Specimen Source Urine, Clean Catch      02/22/23 2234   02/22/23 2235  Hepatic function panel  Add-on,   AD       Question:  Release to patient  Answer:  Immediate   02/22/23 2234   02/22/23 2152  Basic metabolic panel  (Diabetes Ketoacidosis (DKA))  STAT Now then every 4 hours ,   STAT  02/22/23 2152   02/22/23 2152  Beta-hydroxybutyric acid  (Diabetes Ketoacidosis (DKA))  Now then every 8 hours,   STAT (with URGENT occurrences)      02/22/23 2152   02/22/23 1928  Urinalysis, Routine w reflex microscopic -Urine, Clean Catch  Once,   URGENT       Question:  Specimen Source  Answer:  Urine, Clean Catch   02/22/23 1927            Vitals/Pain Today's Vitals   02/22/23 2115 02/22/23 2130 02/22/23 2149 02/22/23 2216  BP: 135/83 (!) 149/88    Pulse: 95 94    Resp: 18 (!) 22    Temp:  98 F (36.7 C)  98.1 F (36.7 C)  TempSrc:  Oral  Oral  SpO2: 100% 100%    Weight:      Height:      PainSc:   8      Isolation Precautions No active isolations  Medications Medications  insulin regular, human (MYXREDLIN) 100 units/ 100 mL infusion (9.5 Units/hr Intravenous New Bag/Given 02/22/23 2219)  lactated ringers infusion (has no administration in time range)  dextrose 5 % in lactated ringers infusion (has no administration in time range)  dextrose 50 % solution 0-50 mL (has no administration in time range)  potassium chloride 10 mEq in 100 mL IVPB (10 mEq Intravenous New Bag/Given 02/22/23 2217)  sodium chloride 0.9 % bolus 1,000 mL (0 mLs Intravenous Stopped 02/22/23 2148)  ondansetron (ZOFRAN) injection 4 mg (4 mg Intravenous Given 02/22/23 2042)  HYDROmorphone (DILAUDID) injection 1 mg (1 mg Intravenous Given 02/22/23 2056)  lactated  ringers bolus 1,000 mL (1,000 mLs Intravenous New Bag/Given 02/22/23 2216)  HYDROmorphone (DILAUDID) injection 1 mg (1 mg Intravenous Given 02/22/23 2220)    Mobility non-ambulatory

## 2023-02-22 NOTE — Assessment & Plan Note (Signed)
Recurrent in the setting of dehydration will rehydrate and repeat no evidence of infection at this time

## 2023-02-22 NOTE — Subjective & Objective (Signed)
Pt w hx   Of DM2 w DKA and chronic pancreatitis comes in with SOB and ABd pain  Last admit for DKA was in NOvember 2024

## 2023-02-22 NOTE — Assessment & Plan Note (Signed)
N.p.o. Pain control Repeat imaging

## 2023-02-22 NOTE — Assessment & Plan Note (Signed)
In the setting of dehydration in setting of DKA

## 2023-02-23 DIAGNOSIS — Z79899 Other long term (current) drug therapy: Secondary | ICD-10-CM | POA: Diagnosis not present

## 2023-02-23 DIAGNOSIS — K5900 Constipation, unspecified: Secondary | ICD-10-CM | POA: Diagnosis not present

## 2023-02-23 DIAGNOSIS — Z885 Allergy status to narcotic agent status: Secondary | ICD-10-CM | POA: Diagnosis not present

## 2023-02-23 DIAGNOSIS — Z794 Long term (current) use of insulin: Secondary | ICD-10-CM | POA: Diagnosis not present

## 2023-02-23 DIAGNOSIS — E785 Hyperlipidemia, unspecified: Secondary | ICD-10-CM | POA: Diagnosis present

## 2023-02-23 DIAGNOSIS — E1110 Type 2 diabetes mellitus with ketoacidosis without coma: Secondary | ICD-10-CM | POA: Diagnosis not present

## 2023-02-23 DIAGNOSIS — K859 Acute pancreatitis without necrosis or infection, unspecified: Secondary | ICD-10-CM | POA: Diagnosis not present

## 2023-02-23 DIAGNOSIS — R1013 Epigastric pain: Secondary | ICD-10-CM | POA: Diagnosis not present

## 2023-02-23 DIAGNOSIS — D638 Anemia in other chronic diseases classified elsewhere: Secondary | ICD-10-CM | POA: Diagnosis present

## 2023-02-23 DIAGNOSIS — E669 Obesity, unspecified: Secondary | ICD-10-CM | POA: Diagnosis present

## 2023-02-23 DIAGNOSIS — F17210 Nicotine dependence, cigarettes, uncomplicated: Secondary | ICD-10-CM | POA: Diagnosis present

## 2023-02-23 DIAGNOSIS — Z807 Family history of other malignant neoplasms of lymphoid, hematopoietic and related tissues: Secondary | ICD-10-CM | POA: Diagnosis not present

## 2023-02-23 DIAGNOSIS — E1010 Type 1 diabetes mellitus with ketoacidosis without coma: Secondary | ICD-10-CM

## 2023-02-23 DIAGNOSIS — Z86711 Personal history of pulmonary embolism: Secondary | ICD-10-CM | POA: Diagnosis not present

## 2023-02-23 DIAGNOSIS — D72829 Elevated white blood cell count, unspecified: Secondary | ICD-10-CM | POA: Diagnosis not present

## 2023-02-23 DIAGNOSIS — K219 Gastro-esophageal reflux disease without esophagitis: Secondary | ICD-10-CM | POA: Diagnosis present

## 2023-02-23 DIAGNOSIS — E876 Hypokalemia: Secondary | ICD-10-CM | POA: Diagnosis present

## 2023-02-23 DIAGNOSIS — R1012 Left upper quadrant pain: Secondary | ICD-10-CM | POA: Diagnosis present

## 2023-02-23 DIAGNOSIS — I10 Essential (primary) hypertension: Secondary | ICD-10-CM | POA: Diagnosis present

## 2023-02-23 DIAGNOSIS — N179 Acute kidney failure, unspecified: Secondary | ICD-10-CM | POA: Diagnosis not present

## 2023-02-23 DIAGNOSIS — K861 Other chronic pancreatitis: Secondary | ICD-10-CM | POA: Diagnosis not present

## 2023-02-23 DIAGNOSIS — R1310 Dysphagia, unspecified: Secondary | ICD-10-CM | POA: Diagnosis not present

## 2023-02-23 DIAGNOSIS — E860 Dehydration: Secondary | ICD-10-CM | POA: Diagnosis present

## 2023-02-23 DIAGNOSIS — Z7984 Long term (current) use of oral hypoglycemic drugs: Secondary | ICD-10-CM | POA: Diagnosis not present

## 2023-02-23 LAB — BASIC METABOLIC PANEL
Anion gap: 15 (ref 5–15)
Anion gap: 17 — ABNORMAL HIGH (ref 5–15)
Anion gap: 17 — ABNORMAL HIGH (ref 5–15)
Anion gap: 12 (ref 5–15)
Anion gap: 12 (ref 5–15)
Anion gap: 10 (ref 5–15)
Anion gap: 9 (ref 5–15)
BUN: 35 mg/dL — ABNORMAL HIGH (ref 6–20)
BUN: 36 mg/dL — ABNORMAL HIGH (ref 6–20)
BUN: 35 mg/dL — ABNORMAL HIGH (ref 6–20)
BUN: 35 mg/dL — ABNORMAL HIGH (ref 6–20)
BUN: 33 mg/dL — ABNORMAL HIGH (ref 6–20)
BUN: 32 mg/dL — ABNORMAL HIGH (ref 6–20)
BUN: 30 mg/dL — ABNORMAL HIGH (ref 6–20)
CO2: 11 mmol/L — ABNORMAL LOW (ref 22–32)
CO2: 12 mmol/L — ABNORMAL LOW (ref 22–32)
CO2: 10 mmol/L — ABNORMAL LOW (ref 22–32)
CO2: 13 mmol/L — ABNORMAL LOW (ref 22–32)
CO2: 16 mmol/L — ABNORMAL LOW (ref 22–32)
CO2: 19 mmol/L — ABNORMAL LOW (ref 22–32)
CO2: 17 mmol/L — ABNORMAL LOW (ref 22–32)
Calcium: 8.7 mg/dL — ABNORMAL LOW (ref 8.9–10.3)
Calcium: 9.3 mg/dL (ref 8.9–10.3)
Calcium: 9.2 mg/dL (ref 8.9–10.3)
Calcium: 9.1 mg/dL (ref 8.9–10.3)
Calcium: 9.1 mg/dL (ref 8.9–10.3)
Calcium: 9.1 mg/dL (ref 8.9–10.3)
Calcium: 9.1 mg/dL (ref 8.9–10.3)
Chloride: 104 mmol/L (ref 98–111)
Chloride: 108 mmol/L (ref 98–111)
Chloride: 107 mmol/L (ref 98–111)
Chloride: 109 mmol/L (ref 98–111)
Chloride: 109 mmol/L (ref 98–111)
Chloride: 110 mmol/L (ref 98–111)
Chloride: 109 mmol/L (ref 98–111)
Creatinine, Ser: 1.47 mg/dL — ABNORMAL HIGH (ref 0.61–1.24)
Creatinine, Ser: 1.42 mg/dL — ABNORMAL HIGH (ref 0.61–1.24)
Creatinine, Ser: 1.44 mg/dL — ABNORMAL HIGH (ref 0.61–1.24)
Creatinine, Ser: 1.42 mg/dL — ABNORMAL HIGH (ref 0.61–1.24)
Creatinine, Ser: 1.17 mg/dL (ref 0.61–1.24)
Creatinine, Ser: 1.09 mg/dL (ref 0.61–1.24)
Creatinine, Ser: 1.18 mg/dL (ref 0.61–1.24)
GFR, Estimated: 54 mL/min — ABNORMAL LOW (ref >60)
GFR, Estimated: 57 mL/min — ABNORMAL LOW (ref >60)
GFR, Estimated: 56 mL/min — ABNORMAL LOW (ref >60)
GFR, Estimated: 57 mL/min — ABNORMAL LOW (ref >60)
GFR, Estimated: >60 mL/min (ref >60)
GFR, Estimated: >60 mL/min (ref >60)
GFR, Estimated: >60 mL/min (ref >60)
Glucose, Bld: 245 mg/dL — ABNORMAL HIGH (ref 70–99)
Glucose, Bld: 158 mg/dL — ABNORMAL HIGH (ref 70–99)
Glucose, Bld: 201 mg/dL — ABNORMAL HIGH (ref 70–99)
Glucose, Bld: 204 mg/dL — ABNORMAL HIGH (ref 70–99)
Glucose, Bld: 188 mg/dL — ABNORMAL HIGH (ref 70–99)
Glucose, Bld: 132 mg/dL — ABNORMAL HIGH (ref 70–99)
Glucose, Bld: 172 mg/dL — ABNORMAL HIGH (ref 70–99)
Potassium: 3.2 mmol/L — ABNORMAL LOW (ref 3.5–5.1)
Potassium: 3.1 mmol/L — ABNORMAL LOW (ref 3.5–5.1)
Potassium: 3.5 mmol/L (ref 3.5–5.1)
Potassium: 3.3 mmol/L — ABNORMAL LOW (ref 3.5–5.1)
Potassium: 3.5 mmol/L (ref 3.5–5.1)
Potassium: 3.5 mmol/L (ref 3.5–5.1)
Potassium: 3.4 mmol/L — ABNORMAL LOW (ref 3.5–5.1)
Sodium: 130 mmol/L — ABNORMAL LOW (ref 135–145)
Sodium: 137 mmol/L (ref 135–145)
Sodium: 134 mmol/L — ABNORMAL LOW (ref 135–145)
Sodium: 134 mmol/L — ABNORMAL LOW (ref 135–145)
Sodium: 137 mmol/L (ref 135–145)
Sodium: 139 mmol/L (ref 135–145)
Sodium: 135 mmol/L (ref 135–145)

## 2023-02-23 LAB — PHOSPHORUS
Phosphorus: 2.2 mg/dL — ABNORMAL LOW (ref 2.5–4.6)
Phosphorus: 2.2 mg/dL — ABNORMAL LOW (ref 2.5–4.6)

## 2023-02-23 LAB — GLUCOSE, CAPILLARY
Glucose-Capillary: 153 mg/dL — ABNORMAL HIGH (ref 70–99)
Glucose-Capillary: 155 mg/dL — ABNORMAL HIGH (ref 70–99)
Glucose-Capillary: 162 mg/dL — ABNORMAL HIGH (ref 70–99)
Glucose-Capillary: 165 mg/dL — ABNORMAL HIGH (ref 70–99)
Glucose-Capillary: 173 mg/dL — ABNORMAL HIGH (ref 70–99)
Glucose-Capillary: 174 mg/dL — ABNORMAL HIGH (ref 70–99)
Glucose-Capillary: 176 mg/dL — ABNORMAL HIGH (ref 70–99)
Glucose-Capillary: 177 mg/dL — ABNORMAL HIGH (ref 70–99)
Glucose-Capillary: 178 mg/dL — ABNORMAL HIGH (ref 70–99)
Glucose-Capillary: 181 mg/dL — ABNORMAL HIGH (ref 70–99)
Glucose-Capillary: 182 mg/dL — ABNORMAL HIGH (ref 70–99)
Glucose-Capillary: 182 mg/dL — ABNORMAL HIGH (ref 70–99)
Glucose-Capillary: 195 mg/dL — ABNORMAL HIGH (ref 70–99)
Glucose-Capillary: 197 mg/dL — ABNORMAL HIGH (ref 70–99)
Glucose-Capillary: 198 mg/dL — ABNORMAL HIGH (ref 70–99)
Glucose-Capillary: 206 mg/dL — ABNORMAL HIGH (ref 70–99)
Glucose-Capillary: 210 mg/dL — ABNORMAL HIGH (ref 70–99)
Glucose-Capillary: 214 mg/dL — ABNORMAL HIGH (ref 70–99)
Glucose-Capillary: 271 mg/dL — ABNORMAL HIGH (ref 70–99)
Glucose-Capillary: 325 mg/dL — ABNORMAL HIGH (ref 70–99)

## 2023-02-23 LAB — ETHANOL: Alcohol, Ethyl (B): <10 mg/dL (ref <10)

## 2023-02-23 LAB — BETA-HYDROXYBUTYRIC ACID
Beta-Hydroxybutyric Acid: >8 mmol/L — ABNORMAL HIGH (ref 0.05–0.27)
Beta-Hydroxybutyric Acid: 4.24 mmol/L — ABNORMAL HIGH (ref 0.05–0.27)
Beta-Hydroxybutyric Acid: 1.5 mmol/L — ABNORMAL HIGH (ref 0.05–0.27)
Beta-Hydroxybutyric Acid: 1.22 mmol/L — ABNORMAL HIGH (ref 0.05–0.27)

## 2023-02-23 LAB — CBC
HCT: 38.9 % — ABNORMAL LOW (ref 39.0–52.0)
Hemoglobin: 13.6 g/dL (ref 13.0–17.0)
MCH: 33.3 pg (ref 26.0–34.0)
MCHC: 35 g/dL (ref 30.0–36.0)
MCV: 95.1 fL (ref 80.0–100.0)
Platelets: 304 10*3/uL (ref 150–400)
RBC: 4.09 MIL/uL — ABNORMAL LOW (ref 4.22–5.81)
RDW: 11.9 % (ref 11.5–15.5)
WBC: 19.4 10*3/uL — ABNORMAL HIGH (ref 4.0–10.5)
nRBC: 0 % (ref 0.0–0.2)

## 2023-02-23 LAB — CBC WITH DIFFERENTIAL/PLATELET
Abs Immature Granulocytes: 0.11 10*3/uL — ABNORMAL HIGH (ref 0.00–0.07)
Basophils Absolute: 0 10*3/uL (ref 0.0–0.1)
Basophils Relative: 0 %
Eosinophils Absolute: 0.1 10*3/uL (ref 0.0–0.5)
Eosinophils Relative: 1 %
HCT: 35.6 % — ABNORMAL LOW (ref 39.0–52.0)
Hemoglobin: 12.7 g/dL — ABNORMAL LOW (ref 13.0–17.0)
Immature Granulocytes: 1 %
Lymphocytes Relative: 17 %
Lymphs Abs: 2.7 10*3/uL (ref 0.7–4.0)
MCH: 33.6 pg (ref 26.0–34.0)
MCHC: 35.7 g/dL (ref 30.0–36.0)
MCV: 94.2 fL (ref 80.0–100.0)
Monocytes Absolute: 1.2 10*3/uL — ABNORMAL HIGH (ref 0.1–1.0)
Monocytes Relative: 8 %
Neutro Abs: 11.6 10*3/uL — ABNORMAL HIGH (ref 1.7–7.7)
Neutrophils Relative %: 73 %
Platelets: 279 10*3/uL (ref 150–400)
RBC: 3.78 MIL/uL — ABNORMAL LOW (ref 4.22–5.81)
RDW: 12.1 % (ref 11.5–15.5)
WBC: 15.8 10*3/uL — ABNORMAL HIGH (ref 4.0–10.5)
nRBC: 0 % (ref 0.0–0.2)

## 2023-02-23 LAB — BLOOD GAS, VENOUS
Acid-base deficit: 9.9 mmol/L — ABNORMAL HIGH (ref 0.0–2.0)
Bicarbonate: 13.6 mmol/L — ABNORMAL LOW (ref 20.0–28.0)
O2 Saturation: 95.5 %
Patient temperature: 36.8
pCO2, Ven: 24 mm[Hg] — ABNORMAL LOW (ref 44–60)
pH, Ven: 7.36 (ref 7.25–7.43)
pO2, Ven: 65 mm[Hg] — ABNORMAL HIGH (ref 32–45)

## 2023-02-23 LAB — HEPATIC FUNCTION PANEL
ALT: 13 U/L (ref 0–44)
AST: 12 U/L — ABNORMAL LOW (ref 15–41)
Albumin: 3.3 g/dL — ABNORMAL LOW (ref 3.5–5.0)
Alkaline Phosphatase: 68 U/L (ref 38–126)
Bilirubin, Direct: <0.1 mg/dL (ref 0.0–0.2)
Total Bilirubin: 1.2 mg/dL — ABNORMAL HIGH (ref <1.2)
Total Protein: 6.3 g/dL — ABNORMAL LOW (ref 6.5–8.1)

## 2023-02-23 LAB — IRON AND TIBC
Iron: 66 ug/dL (ref 45–182)
Saturation Ratios: 27 % (ref 17.9–39.5)
TIBC: 244 ug/dL — ABNORMAL LOW (ref 250–450)
UIBC: 178 ug/dL

## 2023-02-23 LAB — TROPONIN I (HIGH SENSITIVITY): Troponin I (High Sensitivity): 8 ng/L (ref <18)

## 2023-02-23 LAB — MAGNESIUM
Magnesium: 2 mg/dL (ref 1.7–2.4)
Magnesium: 2 mg/dL (ref 1.7–2.4)

## 2023-02-23 LAB — VITAMIN B12: Vitamin B-12: 967 pg/mL — ABNORMAL HIGH (ref 180–914)

## 2023-02-23 LAB — LIPID PANEL
Cholesterol: 75 mg/dL (ref 0–200)
HDL: 31 mg/dL — ABNORMAL LOW (ref >40)
LDL Cholesterol: 18 mg/dL (ref 0–99)
Total CHOL/HDL Ratio: 2.4 {ratio}
Triglycerides: 132 mg/dL (ref <150)
VLDL: 26 mg/dL (ref 0–40)

## 2023-02-23 LAB — TSH: TSH: 1.678 u[IU]/mL (ref 0.350–4.500)

## 2023-02-23 LAB — PROCALCITONIN: Procalcitonin: 3.15 ng/mL

## 2023-02-23 LAB — FOLATE: Folate: 21 ng/mL (ref >5.9)

## 2023-02-23 LAB — OSMOLALITY: Osmolality: 315 mosm/kg — ABNORMAL HIGH (ref 275–295)

## 2023-02-23 LAB — LIPASE, BLOOD: Lipase: 110 U/L — ABNORMAL HIGH (ref 11–51)

## 2023-02-23 LAB — PREALBUMIN: Prealbumin: 25 mg/dL (ref 18–38)

## 2023-02-23 LAB — CK: Total CK: 24 U/L — ABNORMAL LOW (ref 49–397)

## 2023-02-23 LAB — HIV ANTIBODY (ROUTINE TESTING W REFLEX): HIV Screen 4th Generation wRfx: NONREACTIVE

## 2023-02-23 LAB — OSMOLALITY, URINE: Osmolality, Ur: 508 mosm/kg (ref 300–900)

## 2023-02-23 LAB — FERRITIN: Ferritin: 550 ng/mL — ABNORMAL HIGH (ref 24–336)

## 2023-02-23 MED ORDER — POTASSIUM CHLORIDE 10 MEQ/100ML IV SOLN
10.0000 meq | INTRAVENOUS | Status: AC
Start: 2023-02-23 — End: 2023-02-23
  Administered 2023-02-23 (×3): 10 meq via INTRAVENOUS
  Filled 2023-02-23: qty 100

## 2023-02-23 MED ORDER — PANCRELIPASE (LIP-PROT-AMYL) 12000-38000 UNITS PO CPEP
24000.0000 [IU] | ORAL_CAPSULE | Freq: Three times a day (TID) | ORAL | Status: DC
Start: 2023-02-23 — End: 2023-02-23
  Filled 2023-02-23: qty 2

## 2023-02-23 MED ORDER — ONDANSETRON HCL 4 MG/2ML IJ SOLN
4.0000 mg | Freq: Four times a day (QID) | INTRAMUSCULAR | Status: DC | PRN
  Administered 2023-02-23 – 2023-02-28 (×18): 4 mg via INTRAVENOUS
  Filled 2023-02-23 (×18): qty 2

## 2023-02-23 MED ORDER — ENOXAPARIN SODIUM 40 MG/0.4ML IJ SOSY
40.0000 mg | PREFILLED_SYRINGE | Freq: Every day | INTRAMUSCULAR | Status: DC
Start: 2023-02-23 — End: 2023-02-28
  Administered 2023-02-23 – 2023-02-27 (×5): 40 mg via SUBCUTANEOUS
  Filled 2023-02-23 (×5): qty 0.4

## 2023-02-23 MED ORDER — DEXTROSE IN LACTATED RINGERS 5 % IV SOLN
INTRAVENOUS | Status: AC
Start: 2023-02-23 — End: 2023-02-24

## 2023-02-23 MED ORDER — POTASSIUM & SODIUM PHOSPHATES 280-160-250 MG PO PACK
2.0000 | PACK | Freq: Three times a day (TID) | ORAL | Status: AC
  Administered 2023-02-23 (×3): 2 via ORAL
  Filled 2023-02-23 (×3): qty 2

## 2023-02-23 MED ORDER — ONDANSETRON HCL 4 MG/2ML IJ SOLN
4.0000 mg | Freq: Once | INTRAMUSCULAR | Status: AC
  Administered 2023-02-23: 4 mg via INTRAVENOUS

## 2023-02-23 MED ORDER — ONDANSETRON HCL 4 MG/2ML IJ SOLN
INTRAMUSCULAR | Status: AC
  Administered 2023-02-23: 4 mg via INTRAVENOUS
  Filled 2023-02-23: qty 2

## 2023-02-23 NOTE — Inpatient Diabetes Management (Addendum)
Inpatient Diabetes Program Recommendations  AACE/ADA: New Consensus Statement on Inpatient Glycemic Control (2015)  Target Ranges:  Prepandial:   less than 140 mg/dL      Peak postprandial:   less than 180 mg/dL (1-2 hours)      Critically ill patients:  140 - 180 mg/dL    Latest Reference Range & Units 07/07/22 00:00 01/19/23 07:05  Hemoglobin A1C 4.8 - 5.6 % 5.9 (E) 9.2 (H)  217 mg/dl  (H): Data is abnormally high (E): External lab result  Latest Reference Range & Units 02/22/23 20:10  Sodium 135 - 145 mmol/L 128 (L)  Potassium 3.5 - 5.1 mmol/L 4.5  Chloride 98 - 111 mmol/L 95 (L)  CO2 22 - 32 mmol/L 9 (L)  Glucose 70 - 99 mg/dL 245 (HH)  BUN 6 - 20 mg/dL 35 (H)  Creatinine 8.09 - 1.24 mg/dL 9.83 (H)  Calcium 8.9 - 10.3 mg/dL 9.3  Anion gap 5 - 15  24 (H)    Latest Reference Range & Units 02/22/23 20:47 02/22/23 22:03 02/22/23 23:06 02/22/23 23:36 02/23/23 00:32 02/23/23 01:31 02/23/23 02:36 02/23/23 03:40  Glucose-Capillary 70 - 99 mg/dL 382 (H) 505 (HH)  IV Insulin Drip Started 426 (H) 422 (H) 271 (H) 181 (H) 165 (H) 177 (H)  (HH): Data is critically high (H): Data is abnormally high  Latest Reference Range & Units 02/23/23 04:25 02/23/23 05:28 02/23/23 06:30  Glucose-Capillary 70 - 99 mg/dL 397 (H) 673 (H) 419 (H)  IV Insulin Drip running  (H): Data is abnormally high   Admit with:  DKA/ Pancreatitis Was just admitted 01/19/2023 thru 01/23/2023 with Acute Pancreatitis/ Hyperglycemia  History: DM2, Pancreatitis  Home DM Meds: Toujeo 15 units daily        Metformin 500 mg BID  Current Orders: IV Insulin Drip   Pt was seen by the Diabetes Coordinator during last admission (01/21/2023)    MD- Note 5:36am BMET shows Anion Gap still high and CO2 still low  Please leave pt on the IV Insulin Drip until Anion Gap 12 or less and CO2 level 20 or higher  When pt allowed to transition to SQ Insulin, make sure pt receives home dose basal insulin 2 hours prior to d/c  of the IV Insulin Drip    Addendum 11:30am--Met w/ pt at bedside.  Pt told me this episode of pancreatitis felt different than when he was here for the same issues in November.  Told me he had more nausea and vomiting this time versus abd pain which is what he normally has.  I discussed w/ pt that between the pancreatitis and the DKA, that both conditions can cause severe abd pain, vomiting, and nausea.  Also discussed with patient diagnosis of DKA (pathophysiology), treatment of DKA, lab results, and transition plan to SQ insulin regimen once labs look better.  Pt  confirmed home meds of Metformin and Toujeo insulin.  Sees MD at the [facility] health and wellness clinic.  States he has a CBG meter at home but only checks every other day.  We talked about the importance of checking at least daily and sometimes more often like 2-3 times per day.  Pt stated some interest in CGM.  Reviewed healthy CBG goals for home.  Pt states he has never had HYPO at home--We reviewed the signs and symptoms of Hypoglycemia and proper treatment.     --Will follow patient during hospitalization--  Ambrose Finland RN, MSN, CDCES Diabetes Coordinator Inpatient Glycemic Control Team Team  Pager: [phone] (8a-5p)

## 2023-02-23 NOTE — Progress Notes (Signed)
  Progress Note   Patient: Bryce Mendoza EAV:[id number] DOB: November 25, 1960 DOA: 02/22/2023     0 DOS: the patient was seen and examined on 02/23/2023   Brief hospital course: 62yo with hx chronic pancreatitis, DM2, hx PE, HTN, HLD who presented with DKA  Assessment and Plan: DKA -Anion gap has closed this afternoon, however serum bicarb remains low at 16 -Diabetic Coordinator followiing. Recs to continue insulin gtt until anion gap closed and when bicarb is 20 or greater -When transitioning, will plan to give home dose of long-acting 1-2 hrs prior to stopping insulin gtt -Cont to follow bmet trends  Chronic pancreatits, acute pancreatitis ruled out -Presenting lipase 100 -CT abd reviewed. Previous inflammation resolved -Would cont home Creon when pt is able to tolerate PO  ARF -Improved with IVF  Hx PE -Seen on 09/08/17 CTA chest, no longer seen on f/u CTA chest 10/15/17 -Currently continued on prophylactic lovenox     Subjective: Still feels "bad" this AM  Physical Exam: Vitals:   02/23/23 1200 02/23/23 1300 02/23/23 1400 02/23/23 1500  BP: 113/81 133/61 130/81 137/72  Pulse: 80 86 85 86  Resp: 15 20 12 15   Temp: 97.9 F (36.6 C)     TempSrc: Oral     SpO2: 97% 99% 100% 96%  Weight:      Height:       General exam: Awake, laying in bed, in nad Respiratory system: Normal respiratory effort, no wheezing Cardiovascular system: regular rate, s1, s2 Gastrointestinal system: Soft, nondistended, positive BS Central nervous system: CN2-12 grossly intact, strength intact Extremities: Perfused, no clubbing Skin: Normal skin turgor, no notable skin lesions seen Psychiatry: Mood normal // no visual hallucinations   Data Reviewed:  Na 137, K 3.5, Cr 1.17  Family Communication: Pt in room, family not at bedside  Disposition: Status is: Observation The patient will require care spanning > 2 midnights and should be moved to inpatient because: Severity of illness  Planned  Discharge Destination: Home    Author: Rickey Barbara, MD 02/23/2023 4:29 PM  For on call review [email or web address].

## 2023-02-23 NOTE — Plan of Care (Signed)
  Problem: Clinical Measurements: Goal: Will remain free from infection Outcome: Progressing Goal: Diagnostic test results will improve Outcome: Progressing Goal: Respiratory complications will improve Outcome: Progressing Goal: Cardiovascular complication will be avoided Outcome: Progressing   

## 2023-02-24 DIAGNOSIS — E1110 Type 2 diabetes mellitus with ketoacidosis without coma: Secondary | ICD-10-CM | POA: Diagnosis not present

## 2023-02-24 DIAGNOSIS — N179 Acute kidney failure, unspecified: Secondary | ICD-10-CM | POA: Diagnosis not present

## 2023-02-24 DIAGNOSIS — R1013 Epigastric pain: Secondary | ICD-10-CM | POA: Diagnosis not present

## 2023-02-24 DIAGNOSIS — D72829 Elevated white blood cell count, unspecified: Secondary | ICD-10-CM | POA: Diagnosis not present

## 2023-02-24 LAB — CBC
HCT: 34.2 % — ABNORMAL LOW (ref 39.0–52.0)
Hemoglobin: 11.7 g/dL — ABNORMAL LOW (ref 13.0–17.0)
MCH: 32.9 pg (ref 26.0–34.0)
MCHC: 34.2 g/dL (ref 30.0–36.0)
MCV: 96.1 fL (ref 80.0–100.0)
Platelets: 266 10*3/uL (ref 150–400)
RBC: 3.56 MIL/uL — ABNORMAL LOW (ref 4.22–5.81)
RDW: 12 % (ref 11.5–15.5)
WBC: 13.8 10*3/uL — ABNORMAL HIGH (ref 4.0–10.5)
nRBC: 0 % (ref 0.0–0.2)

## 2023-02-24 LAB — BASIC METABOLIC PANEL
Anion gap: 10 (ref 5–15)
Anion gap: 8 (ref 5–15)
Anion gap: 9 (ref 5–15)
Anion gap: 9 (ref 5–15)
BUN: 18 mg/dL (ref 6–20)
BUN: 22 mg/dL — ABNORMAL HIGH (ref 6–20)
BUN: 23 mg/dL — ABNORMAL HIGH (ref 6–20)
BUN: 26 mg/dL — ABNORMAL HIGH (ref 6–20)
CO2: 14 mmol/L — ABNORMAL LOW (ref 22–32)
CO2: 15 mmol/L — ABNORMAL LOW (ref 22–32)
CO2: 17 mmol/L — ABNORMAL LOW (ref 22–32)
CO2: 17 mmol/L — ABNORMAL LOW (ref 22–32)
Calcium: 8.5 mg/dL — ABNORMAL LOW (ref 8.9–10.3)
Calcium: 8.6 mg/dL — ABNORMAL LOW (ref 8.9–10.3)
Calcium: 8.6 mg/dL — ABNORMAL LOW (ref 8.9–10.3)
Calcium: 8.7 mg/dL — ABNORMAL LOW (ref 8.9–10.3)
Chloride: 111 mmol/L (ref 98–111)
Chloride: 112 mmol/L — ABNORMAL HIGH (ref 98–111)
Chloride: 113 mmol/L — ABNORMAL HIGH (ref 98–111)
Chloride: 113 mmol/L — ABNORMAL HIGH (ref 98–111)
Creatinine, Ser: 0.73 mg/dL (ref 0.61–1.24)
Creatinine, Ser: 0.78 mg/dL (ref 0.61–1.24)
Creatinine, Ser: 0.83 mg/dL (ref 0.61–1.24)
Creatinine, Ser: 0.89 mg/dL (ref 0.61–1.24)
GFR, Estimated: >60 mL/min (ref >60)
GFR, Estimated: >60 mL/min (ref >60)
GFR, Estimated: >60 mL/min (ref >60)
GFR, Estimated: >60 mL/min (ref >60)
Glucose, Bld: 153 mg/dL — ABNORMAL HIGH (ref 70–99)
Glucose, Bld: 175 mg/dL — ABNORMAL HIGH (ref 70–99)
Glucose, Bld: 176 mg/dL — ABNORMAL HIGH (ref 70–99)
Glucose, Bld: 186 mg/dL — ABNORMAL HIGH (ref 70–99)
Potassium: 3 mmol/L — ABNORMAL LOW (ref 3.5–5.1)
Potassium: 3.1 mmol/L — ABNORMAL LOW (ref 3.5–5.1)
Potassium: 3.1 mmol/L — ABNORMAL LOW (ref 3.5–5.1)
Potassium: 3.3 mmol/L — ABNORMAL LOW (ref 3.5–5.1)
Sodium: 136 mmol/L (ref 135–145)
Sodium: 136 mmol/L (ref 135–145)
Sodium: 137 mmol/L (ref 135–145)
Sodium: 139 mmol/L (ref 135–145)

## 2023-02-24 LAB — GLUCOSE, CAPILLARY
Glucose-Capillary: 125 mg/dL — ABNORMAL HIGH (ref 70–99)
Glucose-Capillary: 151 mg/dL — ABNORMAL HIGH (ref 70–99)
Glucose-Capillary: 151 mg/dL — ABNORMAL HIGH (ref 70–99)
Glucose-Capillary: 157 mg/dL — ABNORMAL HIGH (ref 70–99)
Glucose-Capillary: 164 mg/dL — ABNORMAL HIGH (ref 70–99)
Glucose-Capillary: 164 mg/dL — ABNORMAL HIGH (ref 70–99)
Glucose-Capillary: 165 mg/dL — ABNORMAL HIGH (ref 70–99)
Glucose-Capillary: 175 mg/dL — ABNORMAL HIGH (ref 70–99)
Glucose-Capillary: 176 mg/dL — ABNORMAL HIGH (ref 70–99)
Glucose-Capillary: 176 mg/dL — ABNORMAL HIGH (ref 70–99)
Glucose-Capillary: 177 mg/dL — ABNORMAL HIGH (ref 70–99)

## 2023-02-24 LAB — MAGNESIUM: Magnesium: 1.9 mg/dL (ref 1.7–2.4)

## 2023-02-24 LAB — PHOSPHORUS: Phosphorus: 2.5 mg/dL (ref 2.5–4.6)

## 2023-02-24 LAB — BETA-HYDROXYBUTYRIC ACID: Beta-Hydroxybutyric Acid: 0.19 mmol/L (ref 0.05–0.27)

## 2023-02-24 LAB — MRSA NEXT GEN BY PCR, NASAL: MRSA by PCR Next Gen: DETECTED — AB

## 2023-02-24 MED ORDER — POTASSIUM CHLORIDE CRYS ER 20 MEQ PO TBCR
40.0000 meq | EXTENDED_RELEASE_TABLET | Freq: Once | ORAL | Status: AC
  Administered 2023-02-24: 40 meq via ORAL
  Filled 2023-02-24: qty 2

## 2023-02-24 MED ORDER — CHLORHEXIDINE GLUCONATE CLOTH 2 % EX PADS
6.0000 | MEDICATED_PAD | Freq: Every day | CUTANEOUS | Status: DC
  Administered 2023-02-25 – 2023-02-27 (×3): 6 via TOPICAL

## 2023-02-24 MED ORDER — INSULIN ASPART 100 UNIT/ML IJ SOLN
0.0000 [IU] | Freq: Three times a day (TID) | INTRAMUSCULAR | Status: DC
  Administered 2023-02-25: 2 [IU] via SUBCUTANEOUS
  Administered 2023-02-25: 3 [IU] via SUBCUTANEOUS
  Administered 2023-02-25: 5 [IU] via SUBCUTANEOUS
  Administered 2023-02-26 (×2): 3 [IU] via SUBCUTANEOUS
  Administered 2023-02-26: 5 [IU] via SUBCUTANEOUS
  Administered 2023-02-27 (×2): 3 [IU] via SUBCUTANEOUS
  Administered 2023-02-27: 5 [IU] via SUBCUTANEOUS
  Administered 2023-02-28: 15 [IU] via SUBCUTANEOUS

## 2023-02-24 MED ORDER — POTASSIUM CHLORIDE CRYS ER 20 MEQ PO TBCR
40.0000 meq | EXTENDED_RELEASE_TABLET | Freq: Two times a day (BID) | ORAL | Status: AC
  Administered 2023-02-24 – 2023-02-25 (×2): 40 meq via ORAL
  Filled 2023-02-24 (×2): qty 2

## 2023-02-24 MED ORDER — INSULIN GLARGINE-YFGN 100 UNIT/ML ~~LOC~~ SOLN
15.0000 [IU] | Freq: Every day | SUBCUTANEOUS | Status: DC
  Administered 2023-02-24 – 2023-02-27 (×3): 15 [IU] via SUBCUTANEOUS
  Filled 2023-02-24 (×5): qty 0.15

## 2023-02-24 MED ORDER — ALUM & MAG HYDROXIDE-SIMETH 200-200-20 MG/5ML PO SUSP
30.0000 mL | Freq: Four times a day (QID) | ORAL | Status: DC | PRN
  Administered 2023-02-25: 30 mL via ORAL
  Filled 2023-02-24: qty 30

## 2023-02-24 MED ORDER — INSULIN ASPART 100 UNIT/ML IJ SOLN: 0.0000 [IU] | Freq: Every day | INTRAMUSCULAR | Status: DC

## 2023-02-24 MED ORDER — PANTOPRAZOLE SODIUM 40 MG PO TBEC
40.0000 mg | DELAYED_RELEASE_TABLET | Freq: Every day | ORAL | Status: DC
  Administered 2023-02-24 – 2023-02-25 (×2): 40 mg via ORAL
  Filled 2023-02-24 (×2): qty 1

## 2023-02-24 MED ORDER — MUPIROCIN 2 % EX OINT
1.0000 | TOPICAL_OINTMENT | Freq: Two times a day (BID) | CUTANEOUS | Status: DC
  Administered 2023-02-24 – 2023-02-27 (×7): 1 via NASAL
  Filled 2023-02-24 (×3): qty 22

## 2023-02-24 MED ORDER — ORAL CARE MOUTH RINSE: 15.0000 mL | OROMUCOSAL | Status: DC | PRN

## 2023-02-24 NOTE — Progress Notes (Signed)
Pt mentioned his throat has been sore. Pt was having multiple episodes of emesis days prior. Pt mentioned he was having multiple episodes of emesis which led to admission. Will notify his nurse [facility] & Dr. Tyson Babinski. Pt given zofran for nausea & dilaudid for 8 out of 10 pain

## 2023-02-24 NOTE — Plan of Care (Signed)
  Problem: Education: Goal: Knowledge of General Education information will improve Description: Including pain rating scale, medication(s)/side effects and non-pharmacologic comfort measures Outcome: Progressing   Problem: Health Behavior/Discharge Planning: Goal: Ability to manage health-related needs will improve Outcome: Progressing   Problem: Clinical Measurements: Goal: Ability to maintain clinical measurements within normal limits will improve Outcome: Progressing Goal: Will remain free from infection Outcome: Progressing Goal: Diagnostic test results will improve Outcome: Progressing Goal: Respiratory complications will improve Outcome: Progressing Goal: Cardiovascular complication will be avoided Outcome: Progressing   Problem: Activity: Goal: Risk for activity intolerance will decrease Outcome: Progressing   Problem: Nutrition: Goal: Adequate nutrition will be maintained Outcome: Progressing   Problem: Coping: Goal: Level of anxiety will decrease Outcome: Progressing   Problem: Elimination: Goal: Will not experience complications related to bowel motility Outcome: Progressing Goal: Will not experience complications related to urinary retention Outcome: Progressing   Problem: Pain Management: Goal: General experience of comfort will improve Outcome: Progressing   Problem: Safety: Goal: Ability to remain free from injury will improve Outcome: Progressing   Problem: Skin Integrity: Goal: Risk for impaired skin integrity will decrease Outcome: Progressing   Problem: Education: Goal: Ability to describe self-care measures that may prevent or decrease complications (Diabetes Survival Skills Education) will improve Outcome: Progressing   Problem: Coping: Goal: Ability to adjust to condition or change in health will improve Outcome: Progressing   Problem: Fluid Volume: Goal: Ability to maintain a balanced intake and output will improve Outcome:  Progressing   Problem: Health Behavior/Discharge Planning: Goal: Ability to identify and utilize available resources and services will improve Outcome: Progressing Goal: Ability to manage health-related needs will improve Outcome: Progressing   Problem: Metabolic: Goal: Ability to maintain appropriate glucose levels will improve Outcome: Progressing   Problem: Nutritional: Goal: Maintenance of adequate nutrition will improve Outcome: Progressing Goal: Progress toward achieving an optimal weight will improve Outcome: Progressing   Problem: Skin Integrity: Goal: Risk for impaired skin integrity will decrease Outcome: Progressing   Problem: Tissue Perfusion: Goal: Adequacy of tissue perfusion will improve Outcome: Progressing   Problem: Education: Goal: Ability to describe self-care measures that may prevent or decrease complications (Diabetes Survival Skills Education) will improve Outcome: Progressing   Problem: Cardiac: Goal: Ability to maintain an adequate cardiac output will improve Outcome: Progressing   Problem: Health Behavior/Discharge Planning: Goal: Ability to identify and utilize available resources and services will improve Outcome: Progressing Goal: Ability to manage health-related needs will improve Outcome: Progressing   Problem: Fluid Volume: Goal: Ability to achieve a balanced intake and output will improve Outcome: Progressing   Problem: Metabolic: Goal: Ability to maintain appropriate glucose levels will improve Outcome: Progressing   Problem: Nutritional: Goal: Maintenance of adequate nutrition will improve Outcome: Progressing Goal: Maintenance of adequate weight for body size and type will improve Outcome: Progressing   Problem: Respiratory: Goal: Will regain and/or maintain adequate ventilation Outcome: Progressing   Problem: Urinary Elimination: Goal: Ability to achieve and maintain adequate renal perfusion and functioning will  improve Outcome: Progressing   Cindy S. Clelia Croft BSN, RN, CCRP, CCRN 02/24/2023 4:58 AM

## 2023-02-24 NOTE — Progress Notes (Addendum)
PROGRESS NOTE    Bryce Mendoza  ONG:[id number] DOB: 03-06-1960 DOA: 02/22/2023 PCP: Claiborne Rigg, NP    Brief Narrative:   Patient is a 62 years old male with history of chronic pancreatitis, type 2 diabetes, history of pulmonary embolism presented to hospital with epigastric pain nausea vomiting.  History of admission for DKA in the past.  Reported compliance with insulin.  In the ED patient was slightly hypertensive.  Patient was noted to be hyperglycemic with anion gap and mild leukocytosis.  WBC was 21.3 with sodium of 128 creatinine elevation at 1.6.  Lipase elevated at 173.  EKG showed normal sinus rhythm.  Patient was noted to have DKA and was started on insulin drip IV fluids.   Assessment and plan.  Diabetic ketoacidosis.   History of diabetes mellitus type 2.  Continue IV fluids, on insulin drip.    Diabetic coordinator recommend continuation of insulin drip until bicarb is 20 or more.  Will check BMP again in a.m. transition to long-acting insulin.  Patient is on metformin at home.  Currently on hold.  Hypokalemia.  Will continue to replenish.  Check levels in AM.  Abdominal pain likely likely secondary to chronic pancreatitis.  Lipase elevated.  Currently NPO.  Continue with IV fluids.  CT scan of the abdomen on 02/22/2023 with chronic pancreatic pseudocyst.  States that he was not eating anything at home for several days.  Start the patient on clears and advance as tolerated.  Continue Creon from home.  Acute kidney injury.  Initial creatinine was 1.6.  Creatinine has improved to 0.7 at this time.  Receiving IV fluids.  Check BMP in AM.  History of pulmonary embolism.  Not on anticoagulation.  Continue prophylactic Lovenox.   History of hypertension.  On Cozaar.  Will hold.  Blood pressure seems to be stable at this time.  Hyperlipidemia.  On Crestor at home.  Hold for now.  Debility weakness.  Will ambulate in hallway, get PT evaluation.     DVT prophylaxis:  enoxaparin (LOVENOX) injection 40 mg Start: 02/23/23 1200 SCDs Start: 02/23/23 0749   Code Status:     Code Status: Full Code  Disposition: Home likely tomorrow if clinically improved.  Status is: Inpatient  Remains inpatient appropriate because: DKA, electrolyte imbalance,   Family Communication: None at bedside  Consultants:  None  Procedures:  None  Antimicrobials:  None  Anti-infectives (From admission, onward)    None       Subjective: Today, patient was seen and examined at bedside.  States that he has intermittent hiccups and nausea and abdominal pain.  Its mostly  the nausea that still bothering him.  Objective: Vitals:   02/24/23 1000 02/24/23 1100 02/24/23 1110 02/24/23 1200  BP: 136/73 122/77  133/89  Pulse: 74 73  75  Resp: 18 (!) 22  16  Temp:   98.9 F (37.2 C)   TempSrc:   Oral   SpO2: 98% 96%  96%  Weight:      Height:        Intake/Output Summary (Last 24 hours) at 02/24/2023 1451 Last data filed at 02/24/2023 1435 Gross per 24 hour  Intake 2081.6 ml  Output 425 ml  Net 1656.6 ml   Filed Weights   02/22/23 1921 02/22/23 2234 02/23/23 0400  Weight: 113.4 kg 103 kg 102.8 kg    Physical Examination: Body mass index is 29.9 kg/m.   General:  Average built, not in obvious distress, appears weak HENT:  No scleral pallor or icterus noted. Oral mucosa is moist.  Chest: .  Diminished breath sounds bilaterally. No crackles or wheezes.  CVS: S1 &S2 heard. No murmur.  Regular rate and rhythm. Abdomen: Soft, mild nonspecific epigastric tenderness noted.  Bowel sounds are heard.   Extremities: No cyanosis, clubbing or edema.  Peripheral pulses are palpable. Psych: Alert, awake and oriented, normal mood CNS:  No cranial nerve deficits.  Power equal in all extremities.   Skin: Warm and dry.  No rashes noted.  Data Reviewed:   CBC: Recent Labs  Lab 02/22/23 1928 02/23/23 0102 02/23/23 0933 02/24/23 0011  WBC 21.3* 19.4* 15.8* 13.8*   NEUTROABS  --   --  11.6*  --   HGB 15.3 13.6 12.7* 11.7*  HCT 44.2 38.9* 35.6* 34.2*  MCV 96.3 95.1 94.2 96.1  PLT 352 304 279 266    Basic Metabolic Panel: Recent Labs  Lab 02/23/23 0102 02/23/23 0252 02/23/23 0933 02/23/23 1352 02/23/23 1621 02/23/23 1932 02/24/23 0011 02/24/23 0426 02/24/23 0744  NA 130*   < > 134*   < > 139 135 136 137 136  K 3.2*   < > 3.3*   < > 3.5 3.4* 3.1* 3.1* 3.0*  CL 104   < > 109   < > 110 109 113* 112* 111  CO2 11*   < > 13*   < > 19* 17* 14* 15* 17*  GLUCOSE 245*   < > 204*   < > 132* 172* 186* 175* 176*  BUN 35*   < > 35*   < > 32* 30* 26* 23* 22*  CREATININE 1.47*   < > 1.42*   < > 1.09 1.18 0.89 0.83 0.78  CALCIUM 8.7*   < > 9.1   < > 9.1 9.1 8.7* 8.6* 8.5*  MG 2.0  --  2.0  --   --   --  1.9  --   --   PHOS 2.2*  --  2.2*  --   --   --  2.5  --   --    < > = values in this interval not displayed.    Liver Function Tests: Recent Labs  Lab 02/22/23 2010 02/23/23 0102  AST 19 12*  ALT 13 13  ALKPHOS 82 68  BILITOT 1.8* 1.2*  PROT 7.9 6.3*  ALBUMIN 4.0 3.3*     Radiology Studies: DG CHEST PORT 1 VIEW Result Date: 02/22/2023 CLINICAL DATA:  Abdominal pain EXAM: PORTABLE CHEST 1 VIEW COMPARISON:  06/14/2019 FINDINGS: Stable blunting of the left costophrenic angle compatible with scarring. No confluent airspace opacities or effusions. Heart and mediastinal contours within normal limits. IMPRESSION: No active cardiopulmonary disease. Electronically Signed   By: Charlett Nose M.D.   On: 02/22/2023 23:18   CT ABDOMEN PELVIS W CONTRAST Result Date: 02/22/2023 CLINICAL DATA:  Left upper quadrant pain EXAM: CT ABDOMEN AND PELVIS WITH CONTRAST TECHNIQUE: Multidetector CT imaging of the abdomen and pelvis was performed using the standard protocol following bolus administration of intravenous contrast. RADIATION DOSE REDUCTION: This exam was performed according to the departmental dose-optimization program which includes automated exposure  control, adjustment of the mA and/or kV according to patient size and/or use of iterative reconstruction technique. CONTRAST:  OMNIPAQUE IOHEXOL 300 MG/ML  SOLN COMPARISON:  01/19/2023 FINDINGS: Lower chest: Scarring or atelectasis in the lung bases. Hepatobiliary: No focal hepatic abnormality. Gallbladder unremarkable. Pancreas: Atrophy of the pancreatic body and tail. Fluid collection noted in the  region of the pancreatic body and tail measuring approximately 7.7 x 1.9 cm, similar to prior study. No surrounding peripancreatic stranding/inflammation currently. Spleen: No focal abnormality.  Normal size. Adrenals/Urinary Tract: No adrenal abnormality. No focal renal abnormality. No stones or hydronephrosis. Urinary bladder is unremarkable. Stomach/Bowel: Stomach, large and small bowel grossly unremarkable. Vascular/Lymphatic: Aortic atherosclerosis. No evidence of aneurysm or adenopathy. Reproductive: No visible focal abnormality. Other: No free fluid or free air. Musculoskeletal: No acute bony abnormality. IMPRESSION: Elongated chronic pancreatic pseudocyst in the region of the pancreatic body and tail, similar to prior study. Previously seen peripancreatic inflammation has resolved. No acute findings in the abdomen or pelvis. Aortic atherosclerosis. Electronically Signed   By: Charlett Nose M.D.   On: 02/22/2023 23:17      LOS: 1 day    Joycelyn Das, MD [facility] Available via Epic secure chat 7am-7pm After these hours, please refer to coverage provider listed on [email or web address] 02/24/2023, 2:51 PM

## 2023-02-25 DIAGNOSIS — E1110 Type 2 diabetes mellitus with ketoacidosis without coma: Secondary | ICD-10-CM | POA: Diagnosis not present

## 2023-02-25 LAB — GLUCOSE, CAPILLARY
Glucose-Capillary: 134 mg/dL — ABNORMAL HIGH (ref 70–99)
Glucose-Capillary: 156 mg/dL — ABNORMAL HIGH (ref 70–99)
Glucose-Capillary: 165 mg/dL — ABNORMAL HIGH (ref 70–99)
Glucose-Capillary: 192 mg/dL — ABNORMAL HIGH (ref 70–99)
Glucose-Capillary: 194 mg/dL — ABNORMAL HIGH (ref 70–99)
Glucose-Capillary: 206 mg/dL — ABNORMAL HIGH (ref 70–99)

## 2023-02-25 LAB — BASIC METABOLIC PANEL
Anion gap: 10 (ref 5–15)
BUN: 10 mg/dL (ref 6–20)
CO2: 18 mmol/L — ABNORMAL LOW (ref 22–32)
Calcium: 8.9 mg/dL (ref 8.9–10.3)
Chloride: 110 mmol/L (ref 98–111)
Creatinine, Ser: 0.69 mg/dL (ref 0.61–1.24)
GFR, Estimated: >60 mL/min (ref >60)
Glucose, Bld: 179 mg/dL — ABNORMAL HIGH (ref 70–99)
Potassium: 4 mmol/L (ref 3.5–5.1)
Sodium: 138 mmol/L (ref 135–145)

## 2023-02-25 LAB — CBC
HCT: 38.8 % — ABNORMAL LOW (ref 39.0–52.0)
Hemoglobin: 13.2 g/dL (ref 13.0–17.0)
MCH: 33.6 pg (ref 26.0–34.0)
MCHC: 34 g/dL (ref 30.0–36.0)
MCV: 98.7 fL (ref 80.0–100.0)
Platelets: 254 10*3/uL (ref 150–400)
RBC: 3.93 MIL/uL — ABNORMAL LOW (ref 4.22–5.81)
RDW: 12.3 % (ref 11.5–15.5)
WBC: 9.9 10*3/uL (ref 4.0–10.5)
nRBC: 0 % (ref 0.0–0.2)

## 2023-02-25 LAB — PHOSPHORUS: Phosphorus: 2.5 mg/dL (ref 2.5–4.6)

## 2023-02-25 LAB — MAGNESIUM: Magnesium: 1.9 mg/dL (ref 1.7–2.4)

## 2023-02-25 MED ORDER — POTASSIUM CHLORIDE CRYS ER 20 MEQ PO TBCR
40.0000 meq | EXTENDED_RELEASE_TABLET | Freq: Once | ORAL | Status: AC
  Administered 2023-02-25: 40 meq via ORAL
  Filled 2023-02-25: qty 2

## 2023-02-25 MED ORDER — LOSARTAN POTASSIUM 50 MG PO TABS
100.0000 mg | ORAL_TABLET | Freq: Every day | ORAL | Status: DC
  Administered 2023-02-25 – 2023-02-27 (×3): 100 mg via ORAL
  Filled 2023-02-25 (×3): qty 2

## 2023-02-25 MED ORDER — LOSARTAN POTASSIUM 50 MG PO TABS: 100.0000 mg | ORAL_TABLET | Freq: Every day | ORAL | Status: DC

## 2023-02-25 MED ORDER — PANTOPRAZOLE SODIUM 40 MG PO TBEC
40.0000 mg | DELAYED_RELEASE_TABLET | Freq: Two times a day (BID) | ORAL | Status: DC
  Administered 2023-02-25 – 2023-02-28 (×6): 40 mg via ORAL
  Filled 2023-02-25 (×6): qty 1

## 2023-02-25 NOTE — Progress Notes (Signed)
PROGRESS NOTE    Bryce Mendoza  NWG:[id number] DOB: 1960/11/13 DOA: 02/22/2023 PCP: Claiborne Rigg, NP    Brief Narrative:   Patient is a 62 years old male with history of chronic pancreatitis, type 2 diabetes, history of pulmonary embolism presented to hospital with epigastric pain nausea vomiting.  History of admission for DKA in the past.  Reported compliance with insulin.  In the ED, patient was slightly hypertensive.  Patient was noted to be hyperglycemic with anion gap and mild leukocytosis.  WBC was 21.3 with sodium of 128 creatinine elevation at 1.6.  Lipase elevated at 173.  EKG showed normal sinus rhythm.  Patient was noted to have DKA and was started on insulin drip IV fluids.  Assessment and plan.  Diabetic ketoacidosis.   History of diabetes mellitus type 2.  Received IV fluids, insulin drip and currently stable.[name]  Has been transitioned to  long-acting and sliding scale insulin at this time.  Patient is also on metformin at home.    Hypokalemia.  Continue to replenish.  Check BMP in AM  Feeling of foreign body in the esophagus, odynophagia.  Will get barium swallow examination.  Could be secondary to reflux.  Continue MiraLAX, Protonix.  Abdominal pain likely likely secondary to chronic pancreatitis.  CT scan of the abdomen on 02/22/2023 with chronic pancreatic pseudocyst.  Has been advanced on oral diet.  Use of pain on the lower esophageal region with some spasms.  Acute kidney injury.  Initial creatinine was 1.6.  Creatinine has improved to 0.7 at this time.  Improved with IV fluids.  History of pulmonary embolism.  Not on anticoagulation.  Continue prophylactic Lovenox.   History of hypertension.  On Cozaar.  Will hold.  Blood pressure seems to be elevated.  Will reinitiate Cozaar.  Hyperlipidemia.  On Crestor at home.  Will resume on discharge.  Debility weakness.  PT evaluation pending     DVT prophylaxis: enoxaparin (LOVENOX) injection 40 mg Start: 02/23/23  1200 SCDs Start: 02/23/23 0749   Code Status:     Code Status: Full Code  Disposition: Likely home with home health, pending clinical improvement, PT evaluation pending.  Status is: Inpatient  Remains inpatient appropriate because: DKA, electrolyte imbalance, odynophagia.  Pending PT evaluation   Family Communication: None at bedside  Consultants:  None  Procedures:  None  Antimicrobials:  None  Anti-infectives (From admission, onward)    None       Subjective: Today, patient was seen and examined at bedside.  Patient denies any fever, chills or rigor.  But he still complains of some pain over the epigastric region with some nausea.  States that he feels like something is stuck in his lower food pipe.    Objective: Vitals:   02/25/23 1000 02/25/23 1100 02/25/23 1130 02/25/23 1200  BP: (!) 150/97   (!) 163/83  Pulse: 77 78  72  Resp: (!) 22 13  10   Temp:   97.9 F (36.6 C)   TempSrc:   Oral   SpO2: 95% 94%  96%  Weight:      Height:        Intake/Output Summary (Last 24 hours) at 02/25/2023 1303 Last data filed at 02/25/2023 1152 Gross per 24 hour  Intake 1426.58 ml  Output 975 ml  Net 451.58 ml   Filed Weights   02/22/23 2234 02/23/23 0400 02/25/23 0900  Weight: 103 kg 102.8 kg 106.8 kg    Physical Examination: Body mass index is 31.06 kg/m.  General: Obese built, not in obvious distress, appears weak HENT:   No scleral pallor or icterus noted. Oral mucosa is moist.  Chest: .  Diminished breath sounds bilaterally. No crackles or wheezes.  CVS: S1 &S2 heard. No murmur.  Regular rate and rhythm. Abdomen: Soft, mild nonspecific epigastric tenderness noted over the epigastric region.  Bowel sounds are heard.   Extremities: No cyanosis, clubbing or edema.  Peripheral pulses are palpable. Psych: Alert, awake and oriented, normal mood CNS:  No cranial nerve deficits.  Power equal in all extremities.   Skin: Warm and dry.  No rashes noted.  Data  Reviewed:   CBC: Recent Labs  Lab 02/22/23 1928 02/23/23 0102 02/23/23 0933 02/24/23 0011 02/25/23 0254  WBC 21.3* 19.4* 15.8* 13.8* 9.9  NEUTROABS  --   --  11.6*  --   --   HGB 15.3 13.6 12.7* 11.7* 13.2  HCT 44.2 38.9* 35.6* 34.2* 38.8*  MCV 96.3 95.1 94.2 96.1 98.7  PLT 352 304 279 266 254    Basic Metabolic Panel: Recent Labs  Lab 02/23/23 0102 02/23/23 0252 02/23/23 0933 02/23/23 1352 02/23/23 1932 02/24/23 0011 02/24/23 0426 02/24/23 0744 02/24/23 1519 02/25/23 0254  NA 130*   < > 134*   < > 135 136 137 136 139  --   K 3.2*   < > 3.3*   < > 3.4* 3.1* 3.1* 3.0* 3.3*  --   CL 104   < > 109   < > 109 113* 112* 111 113*  --   CO2 11*   < > 13*   < > 17* 14* 15* 17* 17*  --   GLUCOSE 245*   < > 204*   < > 172* 186* 175* 176* 153*  --   BUN 35*   < > 35*   < > 30* 26* 23* 22* 18  --   CREATININE 1.47*   < > 1.42*   < > 1.18 0.89 0.83 0.78 0.73  --   CALCIUM 8.7*   < > 9.1   < > 9.1 8.7* 8.6* 8.5* 8.6*  --   MG 2.0  --  2.0  --   --  1.9  --   --   --  1.9  PHOS 2.2*  --  2.2*  --   --  2.5  --   --   --  2.5   < > = values in this interval not displayed.    Liver Function Tests: Recent Labs  Lab 02/22/23 2010 02/23/23 0102  AST 19 12*  ALT 13 13  ALKPHOS 82 68  BILITOT 1.8* 1.2*  PROT 7.9 6.3*  ALBUMIN 4.0 3.3*     Radiology Studies: No results found.     LOS: 2 days    Joycelyn Das, MD [facility] Available via Epic secure chat 7am-7pm After these hours, please refer to coverage provider listed on [email or web address] 02/25/2023, 1:03 PM

## 2023-02-25 NOTE — Plan of Care (Signed)
  Problem: Coping: Goal: Level of anxiety will decrease Outcome: Progressing   Problem: Pain Management: Goal: General experience of comfort will improve Outcome: Progressing

## 2023-02-26 ENCOUNTER — Inpatient Hospital Stay (HOSPITAL_COMMUNITY): Payer: 59

## 2023-02-26 DIAGNOSIS — E1110 Type 2 diabetes mellitus with ketoacidosis without coma: Secondary | ICD-10-CM | POA: Diagnosis not present

## 2023-02-26 LAB — GLUCOSE, CAPILLARY
Glucose-Capillary: 172 mg/dL — ABNORMAL HIGH (ref 70–99)
Glucose-Capillary: 195 mg/dL — ABNORMAL HIGH (ref 70–99)
Glucose-Capillary: 181 mg/dL — ABNORMAL HIGH (ref 70–99)
Glucose-Capillary: 201 mg/dL — ABNORMAL HIGH (ref 70–99)
Glucose-Capillary: 177 mg/dL — ABNORMAL HIGH (ref 70–99)

## 2023-02-26 LAB — BASIC METABOLIC PANEL
Anion gap: 6 (ref 5–15)
BUN: 8 mg/dL (ref 6–20)
CO2: 20 mmol/L — ABNORMAL LOW (ref 22–32)
Calcium: 8.4 mg/dL — ABNORMAL LOW (ref 8.9–10.3)
Chloride: 107 mmol/L (ref 98–111)
Creatinine, Ser: 0.66 mg/dL (ref 0.61–1.24)
GFR, Estimated: >60 mL/min (ref >60)
Glucose, Bld: 186 mg/dL — ABNORMAL HIGH (ref 70–99)
Potassium: 4.2 mmol/L (ref 3.5–5.1)
Sodium: 133 mmol/L — ABNORMAL LOW (ref 135–145)

## 2023-02-26 LAB — VITAMIN B1: Vitamin B1 (Thiamine): 188.1 nmol/L (ref 66.5–200.0)

## 2023-02-26 MED ORDER — PANCRELIPASE (LIP-PROT-AMYL) 12000-38000 UNITS PO CPEP
24000.0000 [IU] | ORAL_CAPSULE | Freq: Three times a day (TID) | ORAL | Status: DC
  Administered 2023-02-26 – 2023-02-28 (×6): 24000 [IU] via ORAL
  Filled 2023-02-26 (×7): qty 2

## 2023-02-26 MED ORDER — HYDROXYZINE HCL 10 MG PO TABS
10.0000 mg | ORAL_TABLET | Freq: Three times a day (TID) | ORAL | Status: DC | PRN
  Administered 2023-02-26 – 2023-02-27 (×2): 10 mg via ORAL
  Filled 2023-02-26 (×2): qty 1

## 2023-02-26 MED ORDER — HYDROCERIN EX CREA
TOPICAL_CREAM | Freq: Two times a day (BID) | CUTANEOUS | Status: DC
Start: 2023-02-26 — End: 2023-02-28
  Filled 2023-02-26: qty 113

## 2023-02-26 MED ORDER — ROSUVASTATIN CALCIUM 10 MG PO TABS
10.0000 mg | ORAL_TABLET | Freq: Every day | ORAL | Status: DC
  Administered 2023-02-26 – 2023-02-27 (×2): 10 mg via ORAL
  Filled 2023-02-26 (×2): qty 1

## 2023-02-26 NOTE — Plan of Care (Signed)
  Problem: Education: Goal: Knowledge of General Education information will improve Description: Including pain rating scale, medication(s)/side effects and non-pharmacologic comfort measures Outcome: Progressing   Problem: Clinical Measurements: Goal: Will remain free from infection Outcome: Progressing Goal: Diagnostic test results will improve Outcome: Progressing Goal: Cardiovascular complication will be avoided Outcome: Progressing   Problem: Activity: Goal: Risk for activity intolerance will decrease Outcome: Progressing   Problem: Nutrition: Goal: Adequate nutrition will be maintained Outcome: Progressing   Problem: Coping: Goal: Level of anxiety will decrease Outcome: Progressing   Problem: Elimination: Goal: Will not experience complications related to bowel motility Outcome: Progressing Goal: Will not experience complications related to urinary retention Outcome: Progressing   Problem: Pain Management: Goal: General experience of comfort will improve Outcome: Progressing   Problem: Safety: Goal: Ability to remain free from injury will improve Outcome: Progressing   Problem: Skin Integrity: Goal: Risk for impaired skin integrity will decrease Outcome: Progressing

## 2023-02-26 NOTE — Evaluation (Signed)
Physical Therapy Evaluation Patient Details Name: Bryce Mendoza MRN: [id number] DOB: [birth year] Today's Date: 02/26/2023  History of Present Illness  62 yo male admitted 02/22/23 with DKA. NWG:[id number], DM, DVT, PE, idiopathic sever polyneuropathy  Clinical Impression  The patient reports feeling near his baseline. Ambulated x 220' with no device. Patient reports walking dog daily and walks to store. Patient has no needs from PT at this time. PT will sign off.       If plan is discharge home, recommend the following: Assistance with cooking/housework;Assist for transportation;Help with stairs or ramp for entrance   Can travel by private vehicle        Equipment Recommendations None recommended by PT  Recommendations for Other Services       Functional Status Assessment       Precautions / Restrictions Precautions Precautions: Fall Precaution Comments: H/O falls, neuropathy Restrictions Weight Bearing Restrictions Per Provider Order: No      Mobility  Bed Mobility Overal bed mobility: Independent                  Transfers Overall transfer level: Independent                      Ambulation/Gait Ambulation/Gait assistance: Supervision Gait Distance (Feet): 220 Feet Assistive device: None Gait Pattern/deviations: Step-through pattern Gait velocity: decr     General Gait Details: lateral weight shift to  step forward  Stairs            Wheelchair Mobility     Tilt Bed    Modified Rankin (Stroke Patients Only)       Balance Overall balance assessment: Mild deficits observed, not formally tested                                           Pertinent Vitals/Pain Pain Assessment Pain Assessment: Faces Faces Pain Scale: Hurts even more Pain Location: legs Pain Descriptors / Indicators: Discomfort, Pins and needles    Home Living Family/patient expects to be discharged to:: Private residence Living  Arrangements: Alone Available Help at Discharge: Available PRN/intermittently Type of Home: Apartment Home Access: Level entry       Home Layout: One level Home Equipment: Agricultural consultant (2 wheels);Cane - single point;Wheelchair - manual      Prior Function Prior Level of Function : Independent/Modified Independent             Mobility Comments: walks 2 miles, [employer] dog, to store ADLs Comments: IADLs     Extremity/Trunk Assessment   Upper Extremity Assessment Upper Extremity Assessment:  (reports neuropathy of hands)    Lower Extremity Assessment Lower Extremity Assessment: RLE deficits/detail RLE Sensation: history of peripheral neuropathy LLE Sensation: history of peripheral neuropathy       Communication   Communication Communication: No apparent difficulties  Cognition   Behavior During Therapy: WFL for tasks assessed/performed Overall Cognitive Status: Within Functional Limits for tasks assessed                                          General Comments      Exercises     Assessment/Plan    PT Assessment Patient does not need any further PT services  PT Problem List  PT Treatment Interventions      PT Goals (Current goals can be found in the Care Plan section)  Acute Rehab PT Goals Patient Stated Goal: go home tomorrow, PT Goal Formulation: All assessment and education complete, DC therapy    Frequency       Co-evaluation               AM-PAC PT "6 Clicks" Mobility  Outcome Measure Help needed turning from your back to your side while in a flat bed without using bedrails?: None Help needed moving from lying on your back to sitting on the side of a flat bed without using bedrails?: None Help needed moving to and from a bed to a chair (including a wheelchair)?: None Help needed standing up from a chair using your arms (e.g., wheelchair or bedside chair)?: None Help needed to walk in hospital room?: None Help  needed climbing 3-5 steps with a railing? : A Little 6 Click Score: 23    End of Session Equipment Utilized During Treatment: Gait belt Activity Tolerance: Patient tolerated treatment well Patient left: in bed;with call bell/phone within reach Nurse Communication: Mobility status PT Visit Diagnosis: Unsteadiness on feet (R26.81);Other symptoms and signs involving the nervous system (R29.898)    Time: 1610-9604 PT Time Calculation (min) (ACUTE ONLY): 13 min   Charges:   PT Evaluation $PT Eval Low Complexity: 1 Low   PT General Charges $$ ACUTE PT VISIT: 1 Visit         Blanchard Kelch PT Acute Rehabilitation Services Office [phone] Weekend pager-[phone]   Rada Hay 02/26/2023, 2:48 PM

## 2023-02-26 NOTE — Progress Notes (Signed)
PROGRESS NOTE    Bryce Mendoza  WUJ:[id number] DOB: 17-Dec-1960 DOA: 02/22/2023 PCP: Claiborne Rigg, NP    Brief Narrative:   Patient is a 62 years old male with history of chronic pancreatitis, type 2 diabetes, history of pulmonary embolism presented to hospital with epigastric pain nausea vomiting.  History of admission for DKA in the past.  Reported compliance with insulin.  In the ED, patient was slightly hypertensive.  Patient was noted to be hyperglycemic with anion gap and mild leukocytosis.  WBC was 21.3 with sodium of 128 creatinine elevation at 1.6.  Lipase elevated at 173.  EKG showed normal sinus rhythm.  Patient was noted to have DKA and was started on insulin drip IV fluids.  Assessment and plan.  Diabetic ketoacidosis.   History of diabetes mellitus type 2.  Received IV fluids, insulin drip and currently stable  Has been transitioned to  long-acting and sliding scale insulin at this time.  Patient is also on metformin at home.  Glycemic control is adequate at this time.  Hypokalemia.  Improved after replacement.  Latest potassium 4.2.  Feeling of foreign body in the esophagus, odynophagia.  [place]ckley secondary to reflux.  Barium swallow exam was unremarkable.    Continue MiraLAX, Protonix.  Abdominal pain likely likely secondary to chronic pancreatitis.  CT scan of the abdomen on 02/22/2023 with chronic pancreatic pseudocyst.  Has been advanced on oral diet.  Continue PPI, Creon.  Patient still complains of mild pain and does not feel quite ready.  Acute kidney injury.  Initial creatinine was 1.6.  Creatinine has improved to 0.6 at this time.  Improved with IV fluids.  Encourage oral hydration at this time.  History of pulmonary embolism.  Not on anticoagulation.  Continue prophylactic Lovenox.   History of hypertension.  On Cozaar.  Continue.  Blood pressure better today.  Hyperlipidemia.  On Crestor at home.  Will resume on discharge.  Debility weakness.  PT evaluation  pending     DVT prophylaxis: enoxaparin (LOVENOX) injection 40 mg Start: 02/23/23 1200 SCDs Start: 02/23/23 0749   Code Status:     Code Status: Full Code  Disposition: Likely home with home health, pending clinical improvement, PT evaluation pending.  Status is: Inpatient  Remains inpatient appropriate because: DKA, electrolyte imbalance, odynophagia.  Pending PT evaluation   Family Communication: None at bedside  Consultants:  None  Procedures:  None  Antimicrobials:  None  Anti-infectives (From admission, onward)    None       Subjective: Today, patient was seen and examined at bedside.  Patient states that his lower esophageal pain is better today.  Has been able to hold things down but still complains of abdominal pain and not quite ready yet.    Objective: Vitals:   02/26/23 1100 02/26/23 1144 02/26/23 1200 02/26/23 1300  BP:      Pulse: 73  76 75  Resp: 17  15 20   Temp:  99 F (37.2 C)    TempSrc:  Oral    SpO2: 98%  98% 97%  Weight:      Height:        Intake/Output Summary (Last 24 hours) at 02/26/2023 1355 Last data filed at 02/26/2023 0735 Gross per 24 hour  Intake 240 ml  Output --  Net 240 ml   Filed Weights   02/22/23 2234 02/23/23 0400 02/25/23 0900  Weight: 103 kg 102.8 kg 106.8 kg    Physical Examination: Body mass index is 31.06 kg/m.  General: Obese built, not in obvious distress, more alert awake and Communicative. HENT:   No scleral pallor or icterus noted. Oral mucosa is moist.  Chest: .  Diminished breath sounds bilaterally. No crackles or wheezes.  CVS: S1 &S2 heard. No murmur.  Regular rate and rhythm. Abdomen: Soft, mild nonspecific epigastric tenderness noted over the epigastric region out any guarding or rigidity..  Bowel sounds are heard.   Extremities: No cyanosis, clubbing or edema.  Peripheral pulses are palpable. Psych: Alert, awake and oriented, normal mood CNS:  No cranial nerve deficits.  Power equal in all  extremities.   Skin: Warm and dry.  No rashes noted.  Data Reviewed:   CBC: Recent Labs  Lab 02/22/23 1928 02/23/23 0102 02/23/23 0933 02/24/23 0011 02/25/23 0254  WBC 21.3* 19.4* 15.8* 13.8* 9.9  NEUTROABS  --   --  11.6*  --   --   HGB 15.3 13.6 12.7* 11.7* 13.2  HCT 44.2 38.9* 35.6* 34.2* 38.8*  MCV 96.3 95.1 94.2 96.1 98.7  PLT 352 304 279 266 254    Basic Metabolic Panel: Recent Labs  Lab 02/23/23 0102 02/23/23 0252 02/23/23 0933 02/23/23 1352 02/24/23 0011 02/24/23 0426 02/24/23 0744 02/24/23 1519 02/25/23 0254 02/25/23 1246 02/26/23 0250  NA 130*   < > 134*   < > 136 137 136 139  --  138 133*  K 3.2*   < > 3.3*   < > 3.1* 3.1* 3.0* 3.3*  --  4.0 4.2  CL 104   < > 109   < > 113* 112* 111 113*  --  110 107  CO2 11*   < > 13*   < > 14* 15* 17* 17*  --  18* 20*  GLUCOSE 245*   < > 204*   < > 186* 175* 176* 153*  --  179* 186*  BUN 35*   < > 35*   < > 26* 23* 22* 18  --  10 8  CREATININE 1.47*   < > 1.42*   < > 0.89 0.83 0.78 0.73  --  0.69 0.66  CALCIUM 8.7*   < > 9.1   < > 8.7* 8.6* 8.5* 8.6*  --  8.9 8.4*  MG 2.0  --  2.0  --  1.9  --   --   --  1.9  --   --   PHOS 2.2*  --  2.2*  --  2.5  --   --   --  2.5  --   --    < > = values in this interval not displayed.    Liver Function Tests: Recent Labs  Lab 02/22/23 2010 02/23/23 0102  AST 19 12*  ALT 13 13  ALKPHOS 82 68  BILITOT 1.8* 1.2*  PROT 7.9 6.3*  ALBUMIN 4.0 3.3*     Radiology Studies: DG ESOPHAGUS W SINGLE CM (SOL OR THIN BA) Result Date: 02/26/2023 CLINICAL DATA:  Dysphagia. EXAM: ESOPHOGRAM/BARIUM SWALLOW TECHNIQUE: Single contrast examination was performed using thin barium. FLUOROSCOPY: Radiation Exposure Index (as provided by the fluoroscopic device): 103.1 mGy Kerma COMPARISON:  None Available. FINDINGS: Scout: Visualized bilateral lungs are clear. No free air under the domes of diaphragm. The esophageal mucosa is normal-appearing. Transient proximal escape noted in the upper  thoracic esophagus. The esophageal motility is otherwise within normal limits. There is no stricture, ring or web. No hiatal hernia. No spontaneous gastro-esophageal reflux. IMPRESSION: *Essentially unremarkable exam. Electronically Signed   By: Rhea Belton  Ramiro Harvest M.D.   On: 02/26/2023 09:34      LOS: 3 days    Joycelyn Das, MD [facility] Available via Epic secure chat 7am-7pm After these hours, please refer to coverage provider listed on [email or web address] 02/26/2023, 1:55 PM

## 2023-02-27 DIAGNOSIS — E1110 Type 2 diabetes mellitus with ketoacidosis without coma: Secondary | ICD-10-CM | POA: Diagnosis not present

## 2023-02-27 LAB — BASIC METABOLIC PANEL
Anion gap: 9 (ref 5–15)
BUN: 8 mg/dL (ref 6–20)
CO2: 20 mmol/L — ABNORMAL LOW (ref 22–32)
Calcium: 8.3 mg/dL — ABNORMAL LOW (ref 8.9–10.3)
Chloride: 100 mmol/L (ref 98–111)
Creatinine, Ser: 0.65 mg/dL (ref 0.61–1.24)
GFR, Estimated: >60 mL/min (ref >60)
Glucose, Bld: 213 mg/dL — ABNORMAL HIGH (ref 70–99)
Potassium: 3.8 mmol/L (ref 3.5–5.1)
Sodium: 129 mmol/L — ABNORMAL LOW (ref 135–145)

## 2023-02-27 LAB — GLUCOSE, CAPILLARY
Glucose-Capillary: 205 mg/dL — ABNORMAL HIGH (ref 70–99)
Glucose-Capillary: 175 mg/dL — ABNORMAL HIGH (ref 70–99)
Glucose-Capillary: 198 mg/dL — ABNORMAL HIGH (ref 70–99)
Glucose-Capillary: 138 mg/dL — ABNORMAL HIGH (ref 70–99)

## 2023-02-27 MED ORDER — DOCUSATE SODIUM 100 MG PO CAPS
100.0000 mg | ORAL_CAPSULE | Freq: Two times a day (BID) | ORAL | Status: DC
  Administered 2023-02-27 (×2): 100 mg via ORAL
  Filled 2023-02-27 (×2): qty 1

## 2023-02-27 MED ORDER — BISACODYL 10 MG RE SUPP
10.0000 mg | Freq: Every day | RECTAL | Status: DC
  Administered 2023-02-27: 10 mg via RECTAL
  Filled 2023-02-27: qty 1

## 2023-02-27 MED ORDER — HYDROMORPHONE HCL 1 MG/ML IJ SOLN: 1.0000 mg | INTRAMUSCULAR | Status: DC | PRN

## 2023-02-27 MED ORDER — OXYCODONE HCL 5 MG PO TABS
10.0000 mg | ORAL_TABLET | Freq: Four times a day (QID) | ORAL | Status: DC | PRN
  Administered 2023-02-27 – 2023-02-28 (×4): 10 mg via ORAL
  Filled 2023-02-27 (×4): qty 2

## 2023-02-27 MED ORDER — MAGNESIUM CITRATE PO SOLN
1.0000 | Freq: Once | ORAL | Status: AC
  Administered 2023-02-27: 1 via ORAL
  Filled 2023-02-27: qty 296

## 2023-02-27 MED ORDER — PANTOPRAZOLE SODIUM 40 MG PO TBEC: 40.0000 mg | DELAYED_RELEASE_TABLET | Freq: Two times a day (BID) | ORAL | 1 refills | Status: AC

## 2023-02-27 NOTE — Progress Notes (Signed)
Mobility Specialist - Progress Note   02/27/23 1332  Mobility  Activity Ambulated with assistance in hallway  Level of Assistance Independent after set-up  Assistive Device None  Distance Ambulated (ft) 550 ft  Range of Motion/Exercises Active  Activity Response Tolerated well  Mobility Referral Yes  Mobility visit 1 Mobility  Mobility Specialist Start Time (ACUTE ONLY) 1300  Mobility Specialist Stop Time (ACUTE ONLY) 1310  Mobility Specialist Time Calculation (min) (ACUTE ONLY) 10 min   Received in bed and agreed to mobility. Had no issues throughout session. Returned to bed with all needs met.   Marilynne Halsted Mobility Specialist

## 2023-02-27 NOTE — TOC CM/SW Note (Addendum)
Transition of Care [facility]) - Inpatient Brief Assessment   Patient Details  Name: Bryce Mendoza MRN: [id number] Date of Birth: 17-Mar-1960  Transition of Care [facility]) CM/SW Contact:    Darleene Cleaver, LCSW Phone Number: 02/26/2023, 6:10 PM   Clinical Narrative:  Patient has insurance and has a PCP.  No SDOH needs, no anticipated TOC needs.  Transition of Care Asessment: Insurance and Status: Insurance coverage has been reviewed Patient has primary care physician: Yes Home environment has been reviewed: Yes Prior level of function:: Indep Prior/Current Home Services: No current home services Social Drivers of Health Review: SDOH reviewed no interventions necessary Readmission risk has been reviewed: Yes Transition of care needs: no transition of care needs at this time

## 2023-02-27 NOTE — Discharge Summary (Signed)
Physician Discharge Summary  Bryce Mendoza ZOX:[id number] DOB: 1961-02-10 DOA: 02/22/2023  PCP: Claiborne Rigg, NP  Admit date: 02/22/2023 Discharge date: 02/28/2023  Admitted From: Home  Discharge disposition: Home    Recommendations for Outpatient Follow-Up:   Follow up with your primary care provider in one week.  Check CBC, BMP, magnesium in the next visit   Discharge Diagnosis:   Principal Problem:   DKA, type 2 (HCC) Active Problems:   Acute on chronic pancreatitis (HCC)   Leukocytosis   History of pulmonary embolism   DKA, type 2, not at goal [facility])   Abdominal pain   AKI (acute kidney injury) (HCC)   DKA (diabetic ketoacidosis) (HCC)    Discharge Condition: Improved.  Diet recommendation: soft low fat diet.  Wound care: None.  Code status: Full.   History of Present Illness:   Patient is a 62 years old male with history of chronic pancreatitis, type 2 diabetes, history of pulmonary embolism presented to hospital with epigastric pain nausea vomiting.  History of admission for DKA in the past.  Reported compliance with insulin.  In the ED, patient was slightly hypertensive.  Patient was noted to be hyperglycemic with anion gap and mild leukocytosis.  WBC was 21.3 with sodium of 128 creatinine elevation at 1.6.  Lipase elevated at 173.  EKG showed normal sinus rhythm.  Patient was noted to have DKA and was started on insulin drip IV fluids.  Patient was then transferred out of the stepdown unit when improved.   Hospital Course:   Following conditions were addressed during hospitalization as listed below,  Diabetic ketoacidosis.   History of diabetes mellitus type 2.  Received IV fluids, insulin drip during hospitalization and has been transitioned to  long-acting and sliding scale insulin at this time.  Patient is also on metformin at home.  Glycemic control is adequate at this time.    Hypokalemia.  Improved after replacement.  Latest potassium of 4.2    Feeling of foreign body in the esophagus,  Likely secondary to reflux.  Barium swallow exam was unremarkable.    Will continue Protonix at discharge.   Abdominal pain likely likely secondary to chronic pancreatitis.  CT scan of the abdomen on 02/22/2023 with chronic pancreatic pseudocyst.  Has been advanced on oral diet.  Advised against fatty fried food.  Continue PPI, Creon.    Constipation.  Will prescribe some stool softener.  Finally has had a bowel movement and feels better.   Acute kidney injury.  Improved.  Latest creatinine of 0.8.  Initial creatinine was 1.6.  Received IV fluids during hospitalization.  History of pulmonary embolism.  Not on anticoagulation.     History of hypertension.  On Cozaar.  Will continue on discharge.   Hyperlipidemia.  On Crestor at home.  Will resume on discharge.   Debility weakness.  PT evaluation did not recommend any skilled therapy needs.  Disposition.  At this time, patient is stable for disposition home with outpatient PCP follow-up.  Medical Consultants:   None.  Procedures:    None Subjective:   Today, patient was seen and examined at bedside.  Patient denies any nausea, vomiting, fever, chills or rigor.  Discharge Exam:   Vitals:   02/27/23 1940 02/28/23 0504  BP: 127/84 116/86  Pulse: 71 67  Resp: 17 17  Temp: 98.7 F (37.1 C) 98 F (36.7 C)  SpO2: 98% 99%   Vitals:   02/27/23 0426 02/27/23 1200 02/27/23 1940 02/28/23 0504  BP: (!) 130/94 135/89 127/84 116/86  Pulse: 75 72 71 67  Resp: 18  17 17   Temp: 98.4 F (36.9 C) 99 F (37.2 C) 98.7 F (37.1 C) 98 F (36.7 C)  TempSrc: Oral  Oral Oral  SpO2: 99% 97% 98% 99%  Weight:      Height:       Body mass index is 31.06 kg/m.  General: Alert awake, not in obvious distress, obese HENT: pupils equally reacting to light,  No scleral pallor or icterus noted. Oral mucosa is moist.  Chest:    Diminished breath sounds bilaterally. No crackles or wheezes.  CVS: S1  &S2 heard. No murmur.  Regular rate and rhythm. Abdomen: Soft, nontender, nondistended.  Bowel sounds are heard.   Extremities: No cyanosis, clubbing or edema.  Peripheral pulses are palpable. Psych: Alert, awake and oriented, normal mood CNS:  No cranial nerve deficits.  Power equal in all extremities.   Skin: Warm and dry.  No rashes noted.  The results of significant diagnostics from this hospitalization (including imaging, microbiology, ancillary and laboratory) are listed below for reference.     Diagnostic Studies:   DG CHEST PORT 1 VIEW Result Date: 02/22/2023 CLINICAL DATA:  Abdominal pain EXAM: PORTABLE CHEST 1 VIEW COMPARISON:  06/14/2019 FINDINGS: Stable blunting of the left costophrenic angle compatible with scarring. No confluent airspace opacities or effusions. Heart and mediastinal contours within normal limits. IMPRESSION: No active cardiopulmonary disease. Electronically Signed   By: Charlett Nose M.D.   On: 02/22/2023 23:18   CT ABDOMEN PELVIS W CONTRAST Result Date: 02/22/2023 CLINICAL DATA:  Left upper quadrant pain EXAM: CT ABDOMEN AND PELVIS WITH CONTRAST TECHNIQUE: Multidetector CT imaging of the abdomen and pelvis was performed using the standard protocol following bolus administration of intravenous contrast. RADIATION DOSE REDUCTION: This exam was performed according to the departmental dose-optimization program which includes automated exposure control, adjustment of the mA and/or kV according to patient size and/or use of iterative reconstruction technique. CONTRAST:  OMNIPAQUE IOHEXOL 300 MG/ML  SOLN COMPARISON:  01/19/2023 FINDINGS: Lower chest: Scarring or atelectasis in the lung bases. Hepatobiliary: No focal hepatic abnormality. Gallbladder unremarkable. Pancreas: Atrophy of the pancreatic body and tail. Fluid collection noted in the region of the pancreatic body and tail measuring approximately 7.7 x 1.9 cm, similar to prior study. No surrounding  peripancreatic stranding/inflammation currently. Spleen: No focal abnormality.  Normal size. Adrenals/Urinary Tract: No adrenal abnormality. No focal renal abnormality. No stones or hydronephrosis. Urinary bladder is unremarkable. Stomach/Bowel: Stomach, large and small bowel grossly unremarkable. Vascular/Lymphatic: Aortic atherosclerosis. No evidence of aneurysm or adenopathy. Reproductive: No visible focal abnormality. Other: No free fluid or free air. Musculoskeletal: No acute bony abnormality. IMPRESSION: Elongated chronic pancreatic pseudocyst in the region of the pancreatic body and tail, similar to prior study. Previously seen peripancreatic inflammation has resolved. No acute findings in the abdomen or pelvis. Aortic atherosclerosis. Electronically Signed   By: Charlett Nose M.D.   On: 02/22/2023 23:17     Labs:   Basic Metabolic Panel: Recent Labs  Lab 02/23/23 0102 02/23/23 0252 02/23/23 0933 02/23/23 1352 02/24/23 0011 02/24/23 0426 02/24/23 1519 02/25/23 0254 02/25/23 1246 02/26/23 0250 02/27/23 0456 02/28/23 0554  NA 130*   < > 134*   < > 136   < > 139  --  138 133* 129* 135  K 3.2*   < > 3.3*   < > 3.1*   < > 3.3*  --  4.0 4.2 3.8  4.2  CL 104   < > 109   < > 113*   < > 113*  --  110 107 100 103  CO2 11*   < > 13*   < > 14*   < > 17*  --  18* 20* 20* 24  GLUCOSE 245*   < > 204*   < > 186*   < > 153*  --  179* 186* 213* 174*  BUN 35*   < > 35*   < > 26*   < > 18  --  10 8 8 10   CREATININE 1.47*   < > 1.42*   < > 0.89   < > 0.73  --  0.69 0.66 0.65 0.82  CALCIUM 8.7*   < > 9.1   < > 8.7*   < > 8.6*  --  8.9 8.4* 8.3* 8.4*  MG 2.0  --  2.0  --  1.9  --   --  1.9  --   --   --   --   PHOS 2.2*  --  2.2*  --  2.5  --   --  2.5  --   --   --   --    < > = values in this interval not displayed.   GFR Estimated Creatinine Clearance: 122.9 mL/min (by C-G formula based on SCr of 0.82 mg/dL). Liver Function Tests: Recent Labs  Lab 02/22/23 2010 02/23/23 0102  AST 19 12*   ALT 13 13  ALKPHOS 82 68  BILITOT 1.8* 1.2*  PROT 7.9 6.3*  ALBUMIN 4.0 3.3*   Recent Labs  Lab 02/22/23 2010 02/23/23 0536  LIPASE 173* 110*   No results for input(s): "AMMONIA" in the last 168 hours. Coagulation profile No results for input(s): "INR", "PROTIME" in the last 168 hours.  CBC: Recent Labs  Lab 02/22/23 1928 02/23/23 0102 02/23/23 0933 02/24/23 0011 02/25/23 0254  WBC 21.3* 19.4* 15.8* 13.8* 9.9  NEUTROABS  --   --  11.6*  --   --   HGB 15.3 13.6 12.7* 11.7* 13.2  HCT 44.2 38.9* 35.6* 34.2* 38.8*  MCV 96.3 95.1 94.2 96.1 98.7  PLT 352 304 279 266 254   Cardiac Enzymes: Recent Labs  Lab 02/23/23 0102  CKTOTAL 24*   BNP: Invalid input(s): "POCBNP" CBG: Recent Labs  Lab 02/27/23 0801 02/27/23 1204 02/27/23 1635 02/27/23 2017 02/28/23 0909  GLUCAP 205* 175* 198* 138* 371*   D-Dimer No results for input(s): "DDIMER" in the last 72 hours. Hgb A1c No results for input(s): "HGBA1C" in the last 72 hours. Lipid Profile No results for input(s): "CHOL", "HDL", "LDLCALC", "TRIG", "CHOLHDL", "LDLDIRECT" in the last 72 hours. Thyroid function studies No results for input(s): "TSH", "T4TOTAL", "T3FREE", "THYROIDAB" in the last 72 hours.  Invalid input(s): "FREET3" Anemia work up No results for input(s): "VITAMINB12", "FOLATE", "FERRITIN", "TIBC", "IRON", "RETICCTPCT" in the last 72 hours. Microbiology Recent Results (from the past 240 hours)  MRSA Next Gen by PCR, Nasal     Status: Abnormal   Collection Time: 02/24/23 12:50 AM   Specimen: Nasal Mucosa; Nasal Swab  Result Value Ref Range Status   MRSA by PCR Next Gen DETECTED (A) NOT DETECTED Final    Comment: RESULT CALLED TO, READ BACK BY AND VERIFIED WITH: CINDY RN @ [phone] ON 02/24/2023 BY MTA (NOTE) The GeneXpert MRSA Assay (FDA approved for NASAL specimens only), is one component of a comprehensive MRSA colonization surveillance program. It is not intended  to diagnose MRSA infection nor to  guide or monitor treatment for MRSA infections. Test performance is not FDA approved in patients less than 74 years old. Performed at [facility], [address] [place], [place] [address]      Discharge Instructions:   Discharge Instructions     Call MD for:  persistant nausea and vomiting   Complete by: As directed    Call MD for:  severe uncontrolled pain   Complete by: As directed    Diet Carb Modified   Complete by: As directed    Discharge instructions   Complete by: As directed    Follow up with your primary care provider in one week. Check blood work at that time.  Continue insulin at home without interruption.  Seek medical attention for worsening symptoms. Avoid fatty fried foods if possible.   Increase activity slowly   Complete by: As directed       Allergies as of 02/28/2023       Reactions   Fentanyl Nausea And Vomiting   Morphine Other (See Comments)   Severe nausea        Medication List     TAKE these medications    B-D ULTRAFINE III SHORT PEN 31G X 8 MM Misc Generic drug: Insulin Pen Needle Use to inject insulin once daily.   Blood Pressure Monitor Devi Please provide patient with insurance approved blood pressure monitor   dicyclomine 10 MG capsule Commonly known as: BENTYL Take 1 capsule (10 mg total) by mouth 4 (four) times daily -  before meals and at bedtime for 15 days.   docusate sodium 100 MG capsule Commonly known as: Colace Take 1 capsule (100 mg total) by mouth 2 (two) times daily as needed for mild constipation or moderate constipation.   losartan 100 MG tablet Commonly known as: COZAAR Take 1 tablet (100 mg total) by mouth daily.   metFORMIN 500 MG 24 hr tablet Commonly known as: GLUCOPHAGE-XR TAKE 1 TABLET (500 MG TOTAL) BY MOUTH IN THE MORNING AND AT BEDTIME   multivitamin tablet Take 1 tablet by mouth daily.   ondansetron 4 MG tablet Commonly known as: Zofran Take 1 tablet (4 mg total) by  mouth daily as needed for nausea or vomiting.   oxyCODONE 5 MG immediate release tablet Commonly known as: Oxy IR/ROXICODONE Take 1 tablet (5 mg total) by mouth every 6 (six) hours as needed for severe pain (pain score 7-10) or moderate pain (pain score 4-6).   Pancrelipase (Lip-Prot-Amyl) 24000-76000 units Cpep Take 1 capsule (24,000 Units total) by mouth 3 (three) times daily before meals.   pantoprazole 40 MG tablet Commonly known as: PROTONIX Take 1 tablet (40 mg total) by mouth 2 (two) times daily before a meal.   rosuvastatin 10 MG tablet Commonly known as: CRESTOR Take 1 tablet (10 mg total) by mouth daily.   Toujeo SoloStar 300 UNIT/ML Solostar Pen Generic drug: insulin glargine (1 Unit Dial) INJECT 15 UNITS INTO THE SKIN EVERY DAY        Follow-up Information     Claiborne Rigg, NP Follow up in 1 week(s).   Specialty: Nurse Practitioner Contact information: [address] [place] [address] [phone]                  Time coordinating discharge: 39 minutes  Signed:  Khairi Garman  [facility] 02/28/2023, 10:14 AM

## 2023-02-27 NOTE — Plan of Care (Signed)
  Problem: Education: Goal: Knowledge of General Education information will improve Description: Including pain rating scale, medication(s)/side effects and non-pharmacologic comfort measures Outcome: Progressing   Problem: Health Behavior/Discharge Planning: Goal: Ability to manage health-related needs will improve Outcome: Progressing   Problem: Clinical Measurements: Goal: Ability to maintain clinical measurements within normal limits will improve Outcome: Progressing Goal: Will remain free from infection Outcome: Progressing Goal: Diagnostic test results will improve Outcome: Progressing Goal: Respiratory complications will improve Outcome: Progressing Goal: Cardiovascular complication will be avoided Outcome: Progressing   Problem: Activity: Goal: Risk for activity intolerance will decrease Outcome: Progressing   Problem: Nutrition: Goal: Adequate nutrition will be maintained Outcome: Progressing   Problem: Coping: Goal: Level of anxiety will decrease Outcome: Progressing   Problem: Elimination: Goal: Will not experience complications related to bowel motility Outcome: Progressing Goal: Will not experience complications related to urinary retention Outcome: Progressing   Problem: Pain Management: Goal: General experience of comfort will improve Outcome: Progressing   Problem: Safety: Goal: Ability to remain free from injury will improve Outcome: Progressing   Problem: Skin Integrity: Goal: Risk for impaired skin integrity will decrease Outcome: Progressing   Problem: Education: Goal: Ability to describe self-care measures that may prevent or decrease complications (Diabetes Survival Skills Education) will improve Outcome: Progressing Goal: Individualized Educational Video(s) Outcome: Progressing   Problem: Coping: Goal: Ability to adjust to condition or change in health will improve Outcome: Progressing   Problem: Fluid Volume: Goal: Ability to  maintain a balanced intake and output will improve Outcome: Progressing   Problem: Health Behavior/Discharge Planning: Goal: Ability to identify and utilize available resources and services will improve Outcome: Progressing Goal: Ability to manage health-related needs will improve Outcome: Progressing   Problem: Metabolic: Goal: Ability to maintain appropriate glucose levels will improve Outcome: Progressing   Problem: Nutritional: Goal: Maintenance of adequate nutrition will improve Outcome: Progressing Goal: Progress toward achieving an optimal weight will improve Outcome: Progressing   Problem: Tissue Perfusion: Goal: Adequacy of tissue perfusion will improve Outcome: Progressing   Problem: Education: Goal: Ability to describe self-care measures that may prevent or decrease complications (Diabetes Survival Skills Education) will improve Outcome: Progressing Goal: Individualized Educational Video(s) Outcome: Progressing   Problem: Cardiac: Goal: Ability to maintain an adequate cardiac output will improve Outcome: Progressing   Problem: Health Behavior/Discharge Planning: Goal: Ability to identify and utilize available resources and services will improve Outcome: Progressing Goal: Ability to manage health-related needs will improve Outcome: Progressing   Problem: Fluid Volume: Goal: Ability to achieve a balanced intake and output will improve Outcome: Progressing   Problem: Metabolic: Goal: Ability to maintain appropriate glucose levels will improve Outcome: Progressing   Problem: Nutritional: Goal: Maintenance of adequate nutrition will improve Outcome: Progressing Goal: Maintenance of adequate weight for body size and type will improve Outcome: Progressing   Problem: Respiratory: Goal: Will regain and/or maintain adequate ventilation Outcome: Progressing   Problem: Urinary Elimination: Goal: Ability to achieve and maintain adequate renal perfusion and  functioning will improve Outcome: Progressing

## 2023-02-27 NOTE — Progress Notes (Signed)
PROGRESS NOTE    Bryce Mendoza  HYQ:[id number] DOB: 10/12/1960 DOA: 02/22/2023 PCP: Claiborne Rigg, NP    Brief Narrative:   Patient is a 62 years old male with history of chronic pancreatitis, type 2 diabetes, history of pulmonary embolism presented to hospital with epigastric pain nausea vomiting.  History of admission for DKA in the past.  Reported compliance with insulin.  In the ED, patient was slightly hypertensive.  Patient was noted to be hyperglycemic with anion gap and mild leukocytosis.  WBC was 21.3 with sodium of 128 creatinine elevation at 1.6.  Lipase elevated at 173.  EKG showed normal sinus rhythm.  Patient was noted to have DKA and was started on insulin drip IV fluids.  Patient was then transferred out of the stepdown unit General Floor when he symptomatically improved.  Assessment and plan.  Diabetic ketoacidosis.   History of diabetes mellitus type 2.  Received IV fluids, insulin drip and currently stable  Has been transitioned to  long-acting and sliding scale insulin at this time.  Patient is also on metformin at home.  Glycemic control is adequate at this time.  POC glucose of 175.  Hypokalemia.  Improved after replacement.  Latest potassium of 3.8.  Feeling of foreign body in the esophagus, odynophagia.  Likely secondary to reflux.  Barium swallow exam was unremarkable.    Continue MiraLAX, Protonix.  Overall feels better.  Abdominal pain likely likely secondary to chronic pancreatitis.  CT scan of the abdomen on 02/22/2023 with chronic pancreatic pseudocyst.  Has been advanced on oral diet.  Advised against fatty fried food.  Continue PPI, Creon.  Complains of pain and feels like he is not at his baseline.  Wants to try diet for 1 more day to see how he feels.  Constipation.  Will give 1 dose of Dulcolax suppository.  Continue with stool softener.  Acute kidney injury.  Initial creatinine was 1.6.  Creatinine has improved to 0.6 at this time.  Improved with IV  fluids.  Encourage oral hydration at this time.  History of pulmonary embolism.  Not on anticoagulation.  Continue prophylactic Lovenox.   History of hypertension.  On Cozaar.  Continue.  Blood pressure better today.  Hyperlipidemia.  On Crestor at home.  Will resume on discharge.  Debility weakness.  PT evaluation did not recommend any skilled therapy needs.     DVT prophylaxis: enoxaparin (LOVENOX) injection 40 mg Start: 02/23/23 1200 SCDs Start: 02/23/23 0749   Code Status:     Code Status: Full Code  Disposition: Likely home on 02/28/2023.  Status is: Inpatient  Remains inpatient appropriate because:  Pending clinical improvement.   Family Communication: None at bedside  Consultants:  None  Procedures:  None  Antimicrobials:  None  Anti-infectives (From admission, onward)    None       Subjective: Today, patient was seen and examined at bedside.  Patient states that if has been having constipation for several days and still complains of pain in the abdomen.  States that it has been 1 day that he tried putting wishes to see how he does today.   Objective: Vitals:   02/26/23 1829 02/26/23 2012 02/27/23 0426 02/27/23 1200  BP: (!) 132/94 (!) 126/92 (!) 130/94 135/89  Pulse: 75 73 75 72  Resp: 16 18 18    Temp: 97.7 F (36.5 C) 98.9 F (37.2 C) 98.4 F (36.9 C) 99 F (37.2 C)  TempSrc:  Oral Oral   SpO2: 100% 98% 99%  97%  Weight:      Height:        Intake/Output Summary (Last 24 hours) at 02/27/2023 1356 Last data filed at 02/27/2023 1350 Gross per 24 hour  Intake 480 ml  Output --  Net 480 ml   Filed Weights   02/22/23 2234 02/23/23 0400 02/25/23 0900  Weight: 103 kg 102.8 kg 106.8 kg    Physical Examination: Body mass index is 31.06 kg/m.   General: Obese built, not in obvious distress, more alert awake and Communicative. HENT:   No scleral pallor or icterus noted. Oral mucosa is moist.  Chest: .  Diminished breath sounds bilaterally.  No crackles or wheezes.  CVS: S1 &S2 heard. No murmur.  Regular rate and rhythm. Abdomen: Soft, mild nonspecific epigastric tenderness noted over the epigastric region out any guarding or rigidity..  Bowel sounds are heard.   Extremities: No cyanosis, clubbing or edema.  Peripheral pulses are palpable. Psych: Alert, awake and oriented, normal mood CNS:  No cranial nerve deficits.  Power equal in all extremities.   Skin: Warm and dry.  No rashes noted.  Data Reviewed:   CBC: Recent Labs  Lab 02/22/23 1928 02/23/23 0102 02/23/23 0933 02/24/23 0011 02/25/23 0254  WBC 21.3* 19.4* 15.8* 13.8* 9.9  NEUTROABS  --   --  11.6*  --   --   HGB 15.3 13.6 12.7* 11.7* 13.2  HCT 44.2 38.9* 35.6* 34.2* 38.8*  MCV 96.3 95.1 94.2 96.1 98.7  PLT 352 304 279 266 254    Basic Metabolic Panel: Recent Labs  Lab 02/23/23 0102 02/23/23 0252 02/23/23 0933 02/23/23 1352 02/24/23 0011 02/24/23 0426 02/24/23 0744 02/24/23 1519 02/25/23 0254 02/25/23 1246 02/26/23 0250 02/27/23 0456  NA 130*   < > 134*   < > 136   < > 136 139  --  138 133* 129*  K 3.2*   < > 3.3*   < > 3.1*   < > 3.0* 3.3*  --  4.0 4.2 3.8  CL 104   < > 109   < > 113*   < > 111 113*  --  110 107 100  CO2 11*   < > 13*   < > 14*   < > 17* 17*  --  18* 20* 20*  GLUCOSE 245*   < > 204*   < > 186*   < > 176* 153*  --  179* 186* 213*  BUN 35*   < > 35*   < > 26*   < > 22* 18  --  10 8 8   CREATININE 1.47*   < > 1.42*   < > 0.89   < > 0.78 0.73  --  0.69 0.66 0.65  CALCIUM 8.7*   < > 9.1   < > 8.7*   < > 8.5* 8.6*  --  8.9 8.4* 8.3*  MG 2.0  --  2.0  --  1.9  --   --   --  1.9  --   --   --   PHOS 2.2*  --  2.2*  --  2.5  --   --   --  2.5  --   --   --    < > = values in this interval not displayed.    Liver Function Tests: Recent Labs  Lab 02/22/23 2010 02/23/23 0102  AST 19 12*  ALT 13 13  ALKPHOS 82 68  BILITOT 1.8* 1.2*  PROT 7.9 6.3*  ALBUMIN 4.0 3.3*     Radiology Studies: DG ESOPHAGUS W SINGLE CM (SOL OR THIN  BA) Result Date: 02/26/2023 CLINICAL DATA:  Dysphagia. EXAM: ESOPHOGRAM/BARIUM SWALLOW TECHNIQUE: Single contrast examination was performed using thin barium. FLUOROSCOPY: Radiation Exposure Index (as provided by the fluoroscopic device): 103.1 mGy Kerma COMPARISON:  None Available. FINDINGS: Scout: Visualized bilateral lungs are clear. No free air under the domes of diaphragm. The esophageal mucosa is normal-appearing. Transient proximal escape noted in the upper thoracic esophagus. The esophageal motility is otherwise within normal limits. There is no stricture, ring or web. No hiatal hernia. No spontaneous gastro-esophageal reflux. IMPRESSION: *Essentially unremarkable exam. Electronically Signed   By: Jules Schick M.D.   On: 02/26/2023 09:34      LOS: 4 days    Joycelyn Das, MD [facility] Available via Epic secure chat 7am-7pm After these hours, please refer to coverage provider listed on [email or web address] 02/27/2023, 1:56 PM

## 2023-02-28 DIAGNOSIS — K861 Other chronic pancreatitis: Secondary | ICD-10-CM | POA: Diagnosis not present

## 2023-02-28 DIAGNOSIS — D72829 Elevated white blood cell count, unspecified: Secondary | ICD-10-CM | POA: Diagnosis not present

## 2023-02-28 DIAGNOSIS — K859 Acute pancreatitis without necrosis or infection, unspecified: Secondary | ICD-10-CM | POA: Diagnosis not present

## 2023-02-28 DIAGNOSIS — E1110 Type 2 diabetes mellitus with ketoacidosis without coma: Secondary | ICD-10-CM | POA: Diagnosis not present

## 2023-02-28 LAB — BASIC METABOLIC PANEL
Anion gap: 8 (ref 5–15)
BUN: 10 mg/dL (ref 6–20)
CO2: 24 mmol/L (ref 22–32)
Calcium: 8.4 mg/dL — ABNORMAL LOW (ref 8.9–10.3)
Chloride: 103 mmol/L (ref 98–111)
Creatinine, Ser: 0.82 mg/dL (ref 0.61–1.24)
GFR, Estimated: >60 mL/min (ref >60)
Glucose, Bld: 174 mg/dL — ABNORMAL HIGH (ref 70–99)
Potassium: 4.2 mmol/L (ref 3.5–5.1)
Sodium: 135 mmol/L (ref 135–145)

## 2023-02-28 LAB — GLUCOSE, CAPILLARY: Glucose-Capillary: 371 mg/dL — ABNORMAL HIGH (ref 70–99)

## 2023-02-28 MED ORDER — DOCUSATE SODIUM 100 MG PO CAPS: 100.0000 mg | ORAL_CAPSULE | Freq: Two times a day (BID) | ORAL | 0 refills | Status: AC | PRN

## 2023-02-28 MED ORDER — OXYCODONE HCL 5 MG PO TABS: 5.0000 mg | ORAL_TABLET | Freq: Four times a day (QID) | ORAL | 0 refills | Status: DC | PRN

## 2023-02-28 MED ORDER — ONDANSETRON HCL 4 MG PO TABS: 4.0000 mg | ORAL_TABLET | Freq: Every day | ORAL | 1 refills | Status: AC | PRN

## 2023-02-28 MED ORDER — DICYCLOMINE HCL 10 MG PO CAPS: 10.0000 mg | ORAL_CAPSULE | Freq: Three times a day (TID) | ORAL | 0 refills | Status: AC

## 2023-02-28 NOTE — Plan of Care (Signed)
  Problem: Education: Goal: Knowledge of General Education information will improve Description: Including pain rating scale, medication(s)/side effects and non-pharmacologic comfort measures Outcome: Progressing   Problem: Health Behavior/Discharge Planning: Goal: Ability to manage health-related needs will improve Outcome: Progressing   Problem: Clinical Measurements: Goal: Ability to maintain clinical measurements within normal limits will improve Outcome: Progressing Goal: Will remain free from infection Outcome: Progressing Goal: Diagnostic test results will improve Outcome: Progressing Goal: Respiratory complications will improve Outcome: Progressing Goal: Cardiovascular complication will be avoided Outcome: Progressing   Problem: Activity: Goal: Risk for activity intolerance will decrease Outcome: Progressing   Problem: Nutrition: Goal: Adequate nutrition will be maintained Outcome: Progressing   Problem: Coping: Goal: Level of anxiety will decrease Outcome: Progressing   Problem: Elimination: Goal: Will not experience complications related to bowel motility Outcome: Progressing Goal: Will not experience complications related to urinary retention Outcome: Progressing   Problem: Pain Management: Goal: General experience of comfort will improve Outcome: Progressing   Problem: Safety: Goal: Ability to remain free from injury will improve Outcome: Progressing   Problem: Skin Integrity: Goal: Risk for impaired skin integrity will decrease Outcome: Progressing   Problem: Education: Goal: Ability to describe self-care measures that may prevent or decrease complications (Diabetes Survival Skills Education) will improve Outcome: Progressing Goal: Individualized Educational Video(s) Outcome: Progressing   Problem: Coping: Goal: Ability to adjust to condition or change in health will improve Outcome: Progressing   Problem: Fluid Volume: Goal: Ability to  maintain a balanced intake and output will improve Outcome: Progressing   Problem: Health Behavior/Discharge Planning: Goal: Ability to identify and utilize available resources and services will improve Outcome: Progressing Goal: Ability to manage health-related needs will improve Outcome: Progressing   Problem: Metabolic: Goal: Ability to maintain appropriate glucose levels will improve Outcome: Progressing   Problem: Nutritional: Goal: Maintenance of adequate nutrition will improve Outcome: Progressing Goal: Progress toward achieving an optimal weight will improve Outcome: Progressing   Problem: Skin Integrity: Goal: Risk for impaired skin integrity will decrease Outcome: Progressing   Problem: Tissue Perfusion: Goal: Adequacy of tissue perfusion will improve Outcome: Progressing   Problem: Education: Goal: Ability to describe self-care measures that may prevent or decrease complications (Diabetes Survival Skills Education) will improve Outcome: Progressing Goal: Individualized Educational Video(s) Outcome: Progressing   Problem: Cardiac: Goal: Ability to maintain an adequate cardiac output will improve Outcome: Progressing   Problem: Health Behavior/Discharge Planning: Goal: Ability to identify and utilize available resources and services will improve Outcome: Progressing Goal: Ability to manage health-related needs will improve Outcome: Progressing   Problem: Fluid Volume: Goal: Ability to achieve a balanced intake and output will improve Outcome: Progressing   Problem: Metabolic: Goal: Ability to maintain appropriate glucose levels will improve Outcome: Progressing   Problem: Nutritional: Goal: Maintenance of adequate nutrition will improve Outcome: Progressing Goal: Maintenance of adequate weight for body size and type will improve Outcome: Progressing   Problem: Respiratory: Goal: Will regain and/or maintain adequate ventilation Outcome: Progressing    Problem: Urinary Elimination: Goal: Ability to achieve and maintain adequate renal perfusion and functioning will improve Outcome: Progressing

## 2023-02-28 NOTE — Progress Notes (Signed)
AVS reviewed w/ pt who verbalized an understanding- no other questions at this time. PIV removed as noted, central tele notified of pt d/c to home. Pt dressing for d/c to home. Calling a friend for a ride home or will take an uber. Primary nurse updated

## 2023-02-28 NOTE — Plan of Care (Signed)
  Problem: Education: Goal: Knowledge of General Education information will improve Description: Including pain rating scale, medication(s)/side effects and non-pharmacologic comfort measures Outcome: Progressing   Problem: Health Behavior/Discharge Planning: Goal: Ability to manage health-related needs will improve Outcome: Progressing   Problem: Clinical Measurements: Goal: Ability to maintain clinical measurements within normal limits will improve Outcome: Progressing Goal: Will remain free from infection Outcome: Progressing Goal: Diagnostic test results will improve Outcome: Progressing Goal: Respiratory complications will improve Outcome: Progressing Goal: Cardiovascular complication will be avoided Outcome: Progressing   Problem: Nutrition: Goal: Adequate nutrition will be maintained Outcome: Progressing   Problem: Coping: Goal: Level of anxiety will decrease Outcome: Progressing   Problem: Elimination: Goal: Will not experience complications related to bowel motility Outcome: Progressing Goal: Will not experience complications related to urinary retention Outcome: Progressing   Problem: Pain Management: Goal: General experience of comfort will improve Outcome: Progressing   Problem: Safety: Goal: Ability to remain free from injury will improve Outcome: Progressing   Problem: Skin Integrity: Goal: Risk for impaired skin integrity will decrease Outcome: Progressing   Problem: Education: Goal: Ability to describe self-care measures that may prevent or decrease complications (Diabetes Survival Skills Education) will improve Outcome: Progressing Goal: Individualized Educational Video(s) Outcome: Progressing   Problem: Coping: Goal: Ability to adjust to condition or change in health will improve Outcome: Progressing   Problem: Fluid Volume: Goal: Ability to maintain a balanced intake and output will improve Outcome: Progressing   Problem:  Nutritional: Goal: Maintenance of adequate nutrition will improve Outcome: Progressing Goal: Progress toward achieving an optimal weight will improve Outcome: Progressing   Problem: Cardiac: Goal: Ability to maintain an adequate cardiac output will improve Outcome: Progressing

## 2023-03-02 ENCOUNTER — Telehealth: Payer: Self-pay

## 2023-03-02 NOTE — Transitions of Care (Post Inpatient/ED Visit) (Signed)
   03/02/2023  Name: Bryce Mendoza MRN: [id number] DOB: [birth year]  Today's TOC FU Call Status: Today's TOC FU Call Status:: Unsuccessful Call (1st Attempt) Unsuccessful Call (1st Attempt) Date: 03/02/23  Attempted to reach the patient regarding the most recent Inpatient/ED visit.  Follow Up Plan: Additional outreach attempts will be made to reach the patient to complete the Transitions of Care (Post Inpatient/ED visit) call.   Alyse Low, RN, BA, [facility], CRRN [facility] Coordinator, Transition of Care Ph # [phone]

## 2023-03-03 ENCOUNTER — Telehealth: Payer: Self-pay

## 2023-03-03 NOTE — Transitions of Care (Post Inpatient/ED Visit) (Signed)
   03/03/2023  Name: Bryce Mendoza MRN: [id number] DOB: September 16, 1960  Today's TOC FU Call Status: Today's TOC FU Call Status:: Unsuccessful Call (2nd Attempt) Unsuccessful Call (2nd Attempt) Date: 03/03/23  Attempted to reach the patient regarding the most recent Inpatient/ED visit.  Follow Up Plan: Additional outreach attempts will be made to reach the patient to complete the Transitions of Care (Post Inpatient/ED visit) call.   Channing Larry, RN, BA, [facility], CRRN [facility] Coordinator, Transition of Care Ph # [phone]

## 2023-03-05 ENCOUNTER — Telehealth: Payer: Self-pay

## 2023-03-05 NOTE — Transitions of Care (Post Inpatient/ED Visit) (Signed)
   03/05/2023  Name: Bryce Mendoza MRN: [id number] DOB: January 18, 1961  Today's TOC FU Call Status: Today's TOC FU Call Status:: Unsuccessful Call (3rd Attempt) Unsuccessful Call (3rd Attempt) Date: 03/05/23  Attempted to reach the patient regarding the most recent Inpatient/ED visit.  Follow Up Plan: No further outreach attempts will be made at this time. We have been unable to contact the patient.  Channing Larry, RN, BA, [facility], CRRN [facility] Coordinator, Transition of Care Ph # [phone]

## 2023-03-11 ENCOUNTER — Other Ambulatory Visit: Payer: Self-pay | Admitting: Nurse Practitioner

## 2023-03-11 DIAGNOSIS — E1165 Type 2 diabetes mellitus with hyperglycemia: Secondary | ICD-10-CM

## 2023-03-11 NOTE — Telephone Encounter (Signed)
 Copied from CRM [id number]. Topic: General - Other >> Mar 11, 2023 11:13 AM Everette C wrote: Reason for CRM: Medication Refill - Most Recent Primary Care Visit:  Provider: FLEETA TONIA GARNETTE LITTIE Department: [facility] Visit Type: TELEPHONE OFFICE VISIT Date: 12/24/2022  Medication: metFORMIN  (GLUCOPHAGE -XR) 500 MG 24 hr tablet [[id number]]  Has the patient contacted their pharmacy? Yes (Agent: If no, request that the patient contact the pharmacy for the refill. If patient does not wish to contact the pharmacy document the reason why and proceed with request.) (Agent: If yes, when and what did the pharmacy advise?)  Is this the correct pharmacy for this prescription? Yes If no, delete pharmacy and type the correct one.  This is the patient's preferred pharmacy:  [id number] [place], [place] - [address] [facility] [address] [place] [address] Phone: [phone] Fax: [phone]   Has the prescription been filled recently? Yes  Is the patient out of the medication? Yes  Has the patient been seen for an appointment in the last year OR does the patient have an upcoming appointment? Yes  Can we respond through MyChart? No  Agent: Please be advised that Rx refills may take up to 3 business days. We ask that you follow-up with your pharmacy.

## 2023-03-16 MED ORDER — METFORMIN HCL ER 500 MG PO TB24: 500.0000 mg | ORAL_TABLET | Freq: Two times a day (BID) | ORAL | 0 refills | Status: DC

## 2023-03-16 NOTE — Telephone Encounter (Signed)
 Requested Prescriptions  Pending Prescriptions Disp Refills   metFORMIN  (GLUCOPHAGE -XR) 500 MG 24 hr tablet 180 tablet 0    Sig: Take 1 tablet (500 mg total) by mouth in the morning and at bedtime.     Endocrinology:  Diabetes - Biguanides Failed - 03/16/2023  7:38 AM      Failed - HBA1C is between 0 and 7.9 and within 180 days    Hemoglobin A1C  Date Value Ref Range Status  07/07/2022 5.9  Final   Hgb A1c MFr Bld  Date Value Ref Range Status  01/19/2023 9.2 (H) 4.8 - 5.6 % Final    Comment:    (NOTE) Pre diabetes:          5.7%-6.4%  Diabetes:              >6.4%  Glycemic control for   <7.0% adults with diabetes          Failed - B12 Level in normal range and within 720 days    Vitamin B-12  Date Value Ref Range Status  02/23/2023 967 (H) 180 - 914 pg/mL Final    Comment:    (NOTE) This assay is not validated for testing neonatal or myeloproliferative syndrome specimens for Vitamin B12 levels. Performed at [facility], [address] [place], [place] [address]          Failed - Valid encounter within last 6 months    Recent Outpatient Visits           2 months ago Type 2 diabetes mellitus with hyperglycemia, with long-term current use of insulin  [facility])   [place] [facility] - A Dept Of [place]. [facility] Fleeta Morris, Garnette CROME, RPH-CPP   3 months ago Welcome to [employer] preventive visit   [place] [facility] [place] - A Dept Of [place]. [facility] [place], [place] W, NP   11 months ago Type 2 diabetes mellitus with hyperglycemia, with long-term current use of insulin  [facility])   [place] [facility] - A Dept Of [place]. [facility] Theotis Haze ORN, NP   1 year ago Hospital discharge follow-up   [place] [facility] [place] [place] - A Dept Of [place]. [facility] [place], [place] W, NP   1 year ago Type 2 diabetes mellitus with hyperglycemia, unspecified whether long term insulin  use  [facility])    [facility] - A Dept Of [place]. [facility] [place], Jon HERO, [place]       Future Appointments             In 2 weeks [place], Jon HERO, PA-C  [facility] - A Dept Of [place]. [facility]            Passed - Cr in normal range and within 360 days    Creatinine, Ser  Date Value Ref Range Status  02/28/2023 0.82 0.61 - 1.24 mg/dL Final   Creatinine, Urine  Date Value Ref Range Status  02/22/2023 76 mg/dL Final    Comment:    Performed at [facility], [address], [place], [place] [address]         Passed - eGFR in normal range and within 360 days    GFR calc Af Amer  Date Value Ref Range Status  08/17/2019 >60 >60 mL/min Final   GFR, Estimated  Date Value Ref Range Status  02/28/2023 >60 >  60 mL/min Final    Comment:    (NOTE) Calculated using the CKD-EPI Creatinine Equation (2021)    eGFR  Date Value Ref Range Status  04/08/2022 68 >59 mL/min/1.73 Final         Passed - CBC within normal limits and completed in the last 12 months    WBC  Date Value Ref Range Status  02/25/2023 9.9 4.0 - 10.5 K/uL Final   RBC  Date Value Ref Range Status  02/25/2023 3.93 (L) 4.22 - 5.81 MIL/uL Final   Hemoglobin  Date Value Ref Range Status  02/25/2023 13.2 13.0 - 17.0 g/dL Final  97/93/7975 84.9 13.0 - 17.7 g/dL Final   HCT  Date Value Ref Range Status  02/25/2023 38.8 (L) 39.0 - 52.0 % Final   Hematocrit  Date Value Ref Range Status  04/08/2022 43.1 37.5 - 51.0 % Final   MCHC  Date Value Ref Range Status  02/25/2023 34.0 30.0 - 36.0 g/dL Final   [facility]  Date Value Ref Range Status  02/25/2023 33.6 26.0 - 34.0 pg Final   MCV  Date Value Ref Range Status  02/25/2023 98.7 80.0 - 100.0 fL Final  04/08/2022 101 (H) 79 - 97 fL Final   No results found for: PLTCOUNTKUC, LABPLAT, POCPLA RDW  Date Value Ref Range Status  02/25/2023 12.3 11.5 - 15.5 % Final  04/08/2022  12.2 11.6 - 15.4 % Final

## 2023-04-01 ENCOUNTER — Encounter: Payer: Self-pay | Admitting: Physician Assistant

## 2023-04-01 ENCOUNTER — Ambulatory Visit: Payer: 59 | Attending: Physician Assistant | Admitting: Physician Assistant

## 2023-04-01 VITALS — BP 125/82 | HR 102 | Wt 221.2 lb

## 2023-04-01 DIAGNOSIS — Z794 Long term (current) use of insulin: Secondary | ICD-10-CM | POA: Diagnosis not present

## 2023-04-01 DIAGNOSIS — Z09 Encounter for follow-up examination after completed treatment for conditions other than malignant neoplasm: Secondary | ICD-10-CM | POA: Diagnosis not present

## 2023-04-01 DIAGNOSIS — E1165 Type 2 diabetes mellitus with hyperglycemia: Secondary | ICD-10-CM

## 2023-04-01 DIAGNOSIS — G894 Chronic pain syndrome: Secondary | ICD-10-CM | POA: Diagnosis not present

## 2023-04-01 DIAGNOSIS — Z7984 Long term (current) use of oral hypoglycemic drugs: Secondary | ICD-10-CM

## 2023-04-01 DIAGNOSIS — K861 Other chronic pancreatitis: Secondary | ICD-10-CM | POA: Diagnosis not present

## 2023-04-01 LAB — GLUCOSE, POCT (MANUAL RESULT ENTRY): POC Glucose: 343 mg/dL — AB (ref 70–99)

## 2023-04-01 MED ORDER — ACETAMINOPHEN-CODEINE 300-30 MG PO TABS: 1.0000 | ORAL_TABLET | ORAL | 0 refills | Status: AC | PRN

## 2023-04-01 MED ORDER — TOUJEO SOLOSTAR 300 UNIT/ML ~~LOC~~ SOPN: 12.0000 [IU] | PEN_INJECTOR | Freq: Two times a day (BID) | SUBCUTANEOUS | 3 refills | Status: DC

## 2023-04-01 NOTE — Patient Instructions (Addendum)
work at a goal of eliminating sugary drinks, candy, desserts, sweets, refined sugars, processed foods, and white carbohydrates.   Drink 80 ounces water daily

## 2023-04-01 NOTE — Progress Notes (Unsigned)
Patient ID: Bryce Mendoza, male   DOB: 1960-08-05, 63 y.o.   MRN: [id number]     Bryce Mendoza, is a 63 y.o. male  ZDG:[id number]  [id number]  DOB - [birth year]  Chief Complaint  Patient presents with   Hospitalization Follow-up    High blood sugar  wants that documented        Subjective:   Bryce Mendoza is a 63 y.o. male here today for a follow up visit after being hospitalized for DKA and pancreatitis.  Blood sugars running in 300s on 15 units toujeo.  Says blood sugars "all over the place" and diet makes "no difference."  H/o non compliance and excess alcohol abuse.  Says he is feeling better overall.  He is asking for some tylenol #3 to "have on hand" with bouts of pancreatitis to help him avoid going to the ED as well as for diagnosed polyneuropathy.     Recent discharge summary 12/22-12/28/2024: Principal Problem:   DKA, type 2 (HCC) Active Problems:   Acute on chronic pancreatitis (HCC)   Leukocytosis   History of pulmonary embolism   DKA, type 2, not at goal [facility])   Abdominal pain   AKI (acute kidney injury) (HCC)   DKA (diabetic ketoacidosis) (HCC)  Patient is a 63 years old male with history of chronic pancreatitis, type 2 diabetes, history of pulmonary embolism presented to hospital with epigastric pain nausea vomiting.  History of admission for DKA in the past.  Reported compliance with insulin.  In the ED, patient was slightly hypertensive.  Patient was noted to be hyperglycemic with anion gap and mild leukocytosis.  WBC was 21.3 with sodium of 128 creatinine elevation at 1.6.  Lipase elevated at 173.  EKG showed normal sinus rhythm.  Patient was noted to have DKA and was started on insulin drip IV fluids.  Patient was then transferred out of the stepdown unit when improved.   Following conditions were addressed during hospitalization as listed below,   Diabetic ketoacidosis.   History of diabetes mellitus type 2.  Received IV fluids, insulin drip during  hospitalization and has been transitioned to  long-acting and sliding scale insulin at this time.  Patient is also on metformin at home.  Glycemic control is adequate at this time.    Hypokalemia.  Improved after replacement.  Latest potassium of 4.2   Feeling of foreign body in the esophagus,  Likely secondary to reflux.  Barium swallow exam was unremarkable.    Will continue Protonix at discharge.   Abdominal pain likely likely secondary to chronic pancreatitis.  CT scan of the abdomen on 02/22/2023 with chronic pancreatic pseudocyst.  Has been advanced on oral diet.  Advised against fatty fried food.  Continue PPI, Creon.    Constipation.  Will prescribe some stool softener.  Finally has had a bowel movement and feels better.   Acute kidney injury.  Improved.  Latest creatinine of 0.8.  Initial creatinine was 1.6.  Received IV fluids during hospitalization.   History of pulmonary embolism.  Not on anticoagulation.     History of hypertension.  On Cozaar.  Will continue on discharge.   Hyperlipidemia.  On Crestor at home.  Will resume on discharge.   Debility weakness.  PT evaluation did not recommend any skilled therapy needs. No problems updated.  ALLERGIES: Allergies  Allergen Reactions   Fentanyl Nausea And Vomiting   Morphine Other (See Comments)    Severe nausea    PAST MEDICAL HISTORY: Past Medical History:  Diagnosis Date   Ascending paralysis (HCC) 06/2019   DM2 (diabetes mellitus, type 2) (HCC)    DVT (deep venous thrombosis) (HCC) 09/09/2017   Empyema lung (HCC)    Pulmonary embolism (HCC) 09/08/2017    MEDICATIONS AT HOME: Prior to Admission medications   Medication Sig Start Date End Date Taking? Authorizing Provider  acetaminophen-codeine (TYLENOL #3) 300-30 MG tablet Take 1 tablet by mouth every 4 (four) hours as needed for moderate pain (pain score 4-6). 04/01/23  Yes Anders Simmonds, PA-C  Blood Pressure Monitor DEVI Please provide patient with  insurance approved blood pressure monitor 12/15/22  Yes Claiborne Rigg, NP  Insulin Pen Needle (B-D ULTRAFINE III SHORT PEN) 31G X 8 MM MISC Use to inject insulin once daily. 12/24/22  Yes Hoy Register, MD  lipase/protease/amylase 24000-76000 units CPEP Take 1 capsule (24,000 Units total) by mouth 3 (three) times daily before meals. 09/10/22  Yes Dahal, Melina Schools, MD  losartan (COZAAR) 100 MG tablet Take 1 tablet (100 mg total) by mouth daily. 01/12/23  Yes Claiborne Rigg, NP  metFORMIN (GLUCOPHAGE-XR) 500 MG 24 hr tablet Take 1 tablet (500 mg total) by mouth in the morning and at bedtime. 03/16/23  Yes Claiborne Rigg, NP  Multiple Vitamin (MULTIVITAMIN) tablet Take 1 tablet by mouth daily.   Yes [provider]  ondansetron (ZOFRAN) 4 MG tablet Take 1 tablet (4 mg total) by mouth daily as needed for nausea or vomiting. 02/28/23 02/28/24 Yes Pokhrel, Rebekah Chesterfield, MD  pantoprazole (PROTONIX) 40 MG tablet Take 1 tablet (40 mg total) by mouth 2 (two) times daily before a meal. 02/27/23  Yes Pokhrel, Laxman, MD  rosuvastatin (CRESTOR) 10 MG tablet Take 1 tablet (10 mg total) by mouth daily. 12/24/22  Yes Hoy Register, MD  dicyclomine (BENTYL) 10 MG capsule Take 1 capsule (10 mg total) by mouth 4 (four) times daily -  before meals and at bedtime for 15 days. 02/28/23 03/15/23  Pokhrel, Rebekah Chesterfield, MD  docusate sodium (COLACE) 100 MG capsule Take 1 capsule (100 mg total) by mouth 2 (two) times daily as needed for mild constipation or moderate constipation. 02/28/23   Pokhrel, Rebekah Chesterfield, MD  insulin glargine, 1 Unit Dial, (TOUJEO SOLOSTAR) 300 UNIT/ML Solostar Pen Inject 12 Units into the skin in the morning and at bedtime. 04/01/23   Coltyn Hanning, Marzella Schlein, PA-C    ROS: Neg HEENT Neg resp Neg cardiac Neg GI Neg GU Neg MS Neg psych Neg neuro  Objective:   Vitals:   04/01/23 1529  BP: 125/82  Pulse: (!) 102  SpO2: 100%  Weight: 221 lb 3.2 oz (100.3 kg)   Exam General appearance : Awake, alert,  not in any distress. Speech Clear. Not toxic looking.  Appears older than stated age/poor health HEENT: Atraumatic and Normocephalic, pupils equally reactive to light and accomodation Neck: Supple, no JVD. No cervical lymphadenopathy.  Chest: Good air entry bilaterally, CTAB.  No rales/rhonchi/wheezing CVS: S1 S2 regular, no murmurs.  Extremities: B/L Lower Ext shows no edema, both legs are warm to touch Neurology: Awake alert, and oriented X 3, CN II-XII intact, Non focal Skin: No Rash  Data Review Lab Results  Component Value Date   HGBA1C 9.2 (H) 01/19/2023   HGBA1C 5.9 07/07/2022   HGBA1C 5.9 04/08/2022    Assessment & Plan   1. Type 2 diabetes mellitus with hyperglycemia, unspecified whether long term insulin use (HCC) (Primary) Uncontrolled-may be a candidate for insulin pump.  Divide dose of toujeo and increase  dose.  Currently taking 15 units daily and blood sugars in the 300s.  Divide and dose at 12 u bid.   - Glucose (CBG) - POCT URINALYSIS DIP (CLINITEK) No ketones - insulin glargine, 1 Unit Dial, (TOUJEO SOLOSTAR) 300 UNIT/ML Solostar Pen; Inject 12 Units into the skin in the morning and at bedtime.  Dispense: 4.5 mL; Refill: 3 - CMP14+EGFR - Lipase - Ambulatory referral to Endocrinology  2. Hospital discharge follow-up High risk readmission  3. Chronic pain syndrome Will need to discuss options for ongoing pain with PCP or get pain clinic referral.   - acetaminophen-codeine (TYLENOL #3) 300-30 MG tablet; Take 1 tablet by mouth every 4 (four) hours as needed for moderate pain (pain score 4-6).  Dispense: 10 tablet; Refill: 0  4. Other chronic pancreatitis (HCC) - CMP14+EGFR - Lipase    Return for Luke-DM 1 month then PCP in 3 months.  The patient was given clear instructions to go to ER or return to medical center if symptoms don't improve, worsen or new problems develop. The patient verbalized understanding. The patient was told to call to get lab results if  they haven't heard anything in the next week.      Georgian Co, PA-C [facility] [place], [place] [phone]   04/01/2023, 5:42 PM

## 2023-04-02 ENCOUNTER — Encounter: Payer: Self-pay | Admitting: Physician Assistant

## 2023-04-02 ENCOUNTER — Telehealth: Payer: Self-pay

## 2023-04-02 LAB — CMP14+EGFR
ALT: 16 [IU]/L (ref 0–44)
AST: 19 [IU]/L (ref 0–40)
Albumin: 4.3 g/dL (ref 3.8–4.9)
Alkaline Phosphatase: 84 [IU]/L (ref 44–121)
BUN/Creatinine Ratio: 13 (ref 10–24)
BUN: 17 mg/dL (ref 8–27)
Bilirubin Total: 0.3 mg/dL (ref 0.0–1.2)
CO2: 15 mmol/L — ABNORMAL LOW (ref 20–29)
Calcium: 10.4 mg/dL — ABNORMAL HIGH (ref 8.6–10.2)
Chloride: 101 mmol/L (ref 96–106)
Creatinine, Ser: 1.35 mg/dL — ABNORMAL HIGH (ref 0.76–1.27)
Globulin, Total: 3 g/dL (ref 1.5–4.5)
Glucose: 384 mg/dL — ABNORMAL HIGH (ref 70–99)
Potassium: 5.3 mmol/L — ABNORMAL HIGH (ref 3.5–5.2)
Sodium: 136 mmol/L (ref 134–144)
Total Protein: 7.3 g/dL (ref 6.0–8.5)
eGFR: 60 mL/min/{1.73_m2} (ref >59)

## 2023-04-02 LAB — POCT URINALYSIS DIP (CLINITEK)
Bilirubin, UA: NEGATIVE
Blood, UA: NEGATIVE
Glucose, UA: 500 mg/dL — AB
Ketones, POC UA: NEGATIVE mg/dL
Leukocytes, UA: NEGATIVE
Nitrite, UA: NEGATIVE
POC PROTEIN,UA: NEGATIVE
Spec Grav, UA: 1.01 (ref 1.010–1.025)
Urobilinogen, UA: 0.2 U/dL
pH, UA: 5.5 (ref 5.0–8.0)

## 2023-04-02 LAB — LIPASE: Lipase: 10 U/L — ABNORMAL LOW (ref 13–78)

## 2023-04-02 NOTE — Telephone Encounter (Signed)
-----   Message from Georgian Co sent at 04/02/2023  3:12 PM EST ----- Please increase your water intake.  Your potassium is a little elevated but should improve with better blood sugar management.  Keep 1 month f/up and we will recheck at that time.  Kidney function is also impaired but should improve with hydration and better blood sugar control.  Pancreatic function is back to normal.

## 2023-04-02 NOTE — Telephone Encounter (Signed)
Patient viewed results and Doctor comment through [facility]

## 2023-04-10 ENCOUNTER — Other Ambulatory Visit: Payer: Self-pay | Admitting: Nurse Practitioner

## 2023-04-10 DIAGNOSIS — I10 Essential (primary) hypertension: Secondary | ICD-10-CM

## 2023-04-10 NOTE — Telephone Encounter (Signed)
 Requested by interface surescripts. Last labs 04/01/23. Last OV 1 week ago  Requested Prescriptions  Pending Prescriptions Disp Refills   losartan  (COZAAR ) 100 MG tablet [Pharmacy Med Name: LOSARTAN  POTASSIUM 100 MG TAB] 90 tablet 1    Sig: TAKE 1 TABLET BY MOUTH EVERY DAY     Cardiovascular:  Angiotensin Receptor Blockers Failed - 04/10/2023  1:00 PM      Failed - Cr in normal range and within 180 days    Creatinine, Ser  Date Value Ref Range Status  04/01/2023 1.35 (H) 0.76 - 1.27 mg/dL Final   Creatinine, Urine  Date Value Ref Range Status  02/22/2023 76 mg/dL Final    Comment:    Performed at [facility], [address] [place], [place] [address]         Failed - K in normal range and within 180 days    Potassium  Date Value Ref Range Status  04/01/2023 5.3 (H) 3.5 - 5.2 mmol/L Final         Passed - Patient is not pregnant      Passed - Last BP in normal range    BP Readings from Last 1 Encounters:  04/01/23 125/82         Passed - Valid encounter within last 6 months    Recent Outpatient Visits           1 week ago Type 2 diabetes mellitus with hyperglycemia, unspecified whether long term insulin  use (HCC)   [place] [facility] Dept Of [place]. [facility], Jon M, [place]   3 months ago Type 2 diabetes mellitus with hyperglycemia, with long-term current use of insulin  [facility])   [place] [facility] - A Dept Of [place]. [facility] Fleeta Morris, Garnette CROME, RPH-CPP   3 months ago Welcome to [employer] preventive visit   [place] [facility] [place] - A Dept Of [place]. [facility] [place], [place] W, NP   1 year ago Type 2 diabetes mellitus with hyperglycemia, with long-term current use of insulin  [facility])   [place] [facility] - A Dept Of [place]. [facility] Theotis Haze ORN, NP   2 years ago Hospital discharge follow-up   [facility] [place] - A Dept  Of [place]. [facility] Theotis Haze ORN, [place]

## 2023-04-27 DIAGNOSIS — E1165 Type 2 diabetes mellitus with hyperglycemia: Secondary | ICD-10-CM | POA: Diagnosis not present

## 2023-04-27 DIAGNOSIS — I10 Essential (primary) hypertension: Secondary | ICD-10-CM | POA: Diagnosis not present

## 2023-04-27 DIAGNOSIS — E785 Hyperlipidemia, unspecified: Secondary | ICD-10-CM | POA: Diagnosis not present

## 2023-05-26 ENCOUNTER — Other Ambulatory Visit: Payer: Self-pay | Admitting: Nurse Practitioner

## 2023-05-26 DIAGNOSIS — E1165 Type 2 diabetes mellitus with hyperglycemia: Secondary | ICD-10-CM

## 2023-05-26 NOTE — Telephone Encounter (Signed)
 Copied from CRM [id number]. Topic: Clinical - Medication Refill >> May 26, 2023 10:04 AM Fuller Mandril wrote: Most Recent Primary Care Visit:  Provider: Anders Simmonds  Department: [facility]  Visit Type: OFFICE VISIT  Date: 04/01/2023  Medication: Insulin Pen Needle (B-D ULTRAFINE III SHORT PEN) 31G X 8 MM MISC - insulin recently increased to twice per day but needles were not increased to match so he will not have enough needles to last until next fill for insulin.   Has the patient contacted their pharmacy? Yes (Agent: If no, request that the patient contact the pharmacy for the refill. If patient does not wish to contact the pharmacy document the reason why and proceed with request.) (Agent: If yes, when and what did the pharmacy advise?) not due until April   Is this the correct pharmacy for this prescription? Yes If no, delete pharmacy and type the correct one.  This is the patient's preferred pharmacy:  [id number] [place], [place] - [address] AT [facility] [address] [place] [address] Phone: [phone] Fax: [phone]   Has the prescription been filled recently? Yes  Is the patient out of the medication? Yes  Has the patient been seen for an appointment in the last year OR does the patient have an upcoming appointment? Yes  Can we respond through MyChart? Yes  Agent: Please be advised that Rx refills may take up to 3 business days. We ask that you follow-up with your pharmacy.

## 2023-05-27 NOTE — Telephone Encounter (Signed)
 Rx 12/24/22 #100 6RF- too soon Requested Prescriptions  Pending Prescriptions Disp Refills   Insulin Pen Needle (B-D ULTRAFINE III SHORT PEN) 31G X 8 MM MISC 100 each 6    Sig: Use to inject insulin once daily.     Endocrinology: Diabetes - Testing Supplies Passed - 05/27/2023  2:49 PM      Passed - Valid encounter within last 12 months    Recent Outpatient Visits           1 month ago Type 2 diabetes mellitus with hyperglycemia, unspecified whether long term insulin use (HCC)   [place] [facility] - A Dept Of [place]. [facility], Marylene Land M, [place]   5 months ago Type 2 diabetes mellitus with hyperglycemia, with long-term current use of insulin [facility])   [place] [facility] [place] - A Dept Of [place]. [facility] Lois Huxley, Cornelius Moras, RPH-CPP   5 months ago Welcome to [employer] preventive visit   [place] [facility] [place] - A Dept Of [place]. [facility] [place], [place] W, NP   1 year ago Type 2 diabetes mellitus with hyperglycemia, with long-term current use of insulin (HCC)   [place] [facility] - A Dept Of [place]. [facility] Claiborne Rigg, NP   2 years ago Hospital discharge follow-up   [facility] [place] - A Dept Of [place]. [facility] Claiborne Rigg, [place]

## 2023-06-16 ENCOUNTER — Telehealth: Payer: Self-pay

## 2023-06-16 DIAGNOSIS — E1165 Type 2 diabetes mellitus with hyperglycemia: Secondary | ICD-10-CM

## 2023-06-16 NOTE — Telephone Encounter (Unsigned)
 Copied from CRM [id number]. Topic: Clinical - Prescription Issue >> Jun 16, 2023  1:49 PM Oddis Bench wrote: Reason for CRM: Patient is calling bc the Dr increased his shots to twice a day and did not change the prescription to reflect the change. He is stating that he only has 10 day supply left and that pharmacy is telling him he needs to have it sent in by the doctor. Next refill is in May needs to get more Insulin Pen Needle (B-D ULTRAFINE III SHORT PEN) 31G X 8 MM MISC

## 2023-06-17 ENCOUNTER — Other Ambulatory Visit: Payer: Self-pay | Admitting: Nurse Practitioner

## 2023-06-17 DIAGNOSIS — E1165 Type 2 diabetes mellitus with hyperglycemia: Secondary | ICD-10-CM

## 2023-06-21 ENCOUNTER — Other Ambulatory Visit: Payer: Self-pay | Admitting: Family Medicine

## 2023-06-23 ENCOUNTER — Other Ambulatory Visit: Payer: Self-pay | Admitting: Nurse Practitioner

## 2023-06-23 DIAGNOSIS — E1165 Type 2 diabetes mellitus with hyperglycemia: Secondary | ICD-10-CM

## 2023-06-23 MED ORDER — BD PEN NEEDLE SHORT U/F 31G X 8 MM MISC: 6 refills | Status: DC

## 2023-06-23 NOTE — Telephone Encounter (Signed)
 Pen needles have been sent. Please let him know in the future the fastest way to connect with me is through [employer].

## 2023-06-23 NOTE — Telephone Encounter (Signed)
 Copied from CRM [id number]. Topic: Clinical - Prescription Issue  >> Jun 16, 2023  1:49 PM Bryce Mendoza wrote:  Reason for CRM: Patient is calling bc the Dr increased his shots to twice a day and did not change the prescription to reflect the change. He is stating that he only has 10 day supply left and that pharmacy is telling him he needs to have it sent in by the doctor. Next refill is in May needs to get more Insulin  Pen Needle (B-D ULTRAFINE III SHORT PEN) 31G X 8 MM MISC  >> Jun 23, 2023  9:36 AM Bryce Mendoza wrote: Patient called back to check on status of request. States this is his 3rd time calling and he has not received a response. Requested prescription  issue 4/15. States there was an increase in dosage and the prescription for the needles needs to be updated to reflect two shows per day. He is almost out and can not get a new refill until May 15th due to the qty not being chaged on rx to reflect increased dose. Would like a callback. Thank You

## 2023-06-24 NOTE — Telephone Encounter (Signed)
Return call unanswered.  

## 2023-07-17 ENCOUNTER — Other Ambulatory Visit: Payer: Self-pay | Admitting: Family Medicine

## 2023-07-17 DIAGNOSIS — Z794 Long term (current) use of insulin: Secondary | ICD-10-CM

## 2023-08-14 ENCOUNTER — Other Ambulatory Visit: Payer: Self-pay | Admitting: Family Medicine

## 2023-08-14 DIAGNOSIS — E1165 Type 2 diabetes mellitus with hyperglycemia: Secondary | ICD-10-CM

## 2023-09-27 ENCOUNTER — Other Ambulatory Visit: Payer: Self-pay | Admitting: Nurse Practitioner

## 2023-11-18 ENCOUNTER — Other Ambulatory Visit: Payer: Self-pay | Admitting: Physician Assistant

## 2023-11-18 DIAGNOSIS — E1165 Type 2 diabetes mellitus with hyperglycemia: Secondary | ICD-10-CM

## 2023-12-29 ENCOUNTER — Other Ambulatory Visit: Payer: Self-pay | Admitting: Nurse Practitioner

## 2023-12-29 DIAGNOSIS — E1165 Type 2 diabetes mellitus with hyperglycemia: Secondary | ICD-10-CM

## 2024-01-02 ENCOUNTER — Other Ambulatory Visit: Payer: Self-pay | Admitting: Family Medicine

## 2024-01-02 ENCOUNTER — Other Ambulatory Visit: Payer: Self-pay | Admitting: Nurse Practitioner

## 2024-01-02 DIAGNOSIS — I10 Essential (primary) hypertension: Secondary | ICD-10-CM

## 2024-01-02 DIAGNOSIS — E1165 Type 2 diabetes mellitus with hyperglycemia: Secondary | ICD-10-CM

## 2024-01-14 ENCOUNTER — Other Ambulatory Visit: Payer: Self-pay | Admitting: Family Medicine

## 2024-01-14 DIAGNOSIS — E1165 Type 2 diabetes mellitus with hyperglycemia: Secondary | ICD-10-CM

## 2024-01-26 ENCOUNTER — Other Ambulatory Visit: Payer: Self-pay | Admitting: Family Medicine

## 2024-01-26 DIAGNOSIS — E1165 Type 2 diabetes mellitus with hyperglycemia: Secondary | ICD-10-CM

## 2024-01-30 ENCOUNTER — Other Ambulatory Visit: Payer: Self-pay | Admitting: Family Medicine

## 2024-01-30 DIAGNOSIS — Z794 Long term (current) use of insulin: Secondary | ICD-10-CM

## 2024-02-03 ENCOUNTER — Other Ambulatory Visit: Payer: Self-pay | Admitting: Pharmacist

## 2024-02-03 ENCOUNTER — Telehealth: Payer: Self-pay | Admitting: Pharmacist

## 2024-02-03 DIAGNOSIS — Z794 Long term (current) use of insulin: Secondary | ICD-10-CM

## 2024-02-03 DIAGNOSIS — E1165 Type 2 diabetes mellitus with hyperglycemia: Secondary | ICD-10-CM

## 2024-02-03 DIAGNOSIS — I10 Essential (primary) hypertension: Secondary | ICD-10-CM

## 2024-02-03 MED ORDER — LOSARTAN POTASSIUM 100 MG PO TABS: 100.0000 mg | ORAL_TABLET | Freq: Every day | ORAL | 0 refills | Status: DC

## 2024-02-03 MED ORDER — TOUJEO SOLOSTAR 300 UNIT/ML ~~LOC~~ SOPN: 12.0000 [IU] | PEN_INJECTOR | Freq: Two times a day (BID) | SUBCUTANEOUS | 0 refills | Status: DC

## 2024-02-03 MED ORDER — METFORMIN HCL ER 500 MG PO TB24: 500.0000 mg | ORAL_TABLET | Freq: Two times a day (BID) | ORAL | 0 refills | Status: DC

## 2024-02-03 MED ORDER — ROSUVASTATIN CALCIUM 10 MG PO TABS: 10.0000 mg | ORAL_TABLET | Freq: Every day | ORAL | 0 refills | Status: DC

## 2024-02-03 NOTE — Telephone Encounter (Signed)
 This patient keeps requesting refills. I called in 1 month for him but he needs an appointment to continue to receive refills in the future. Can we outreach him and try to schedule with Zelda?

## 2024-02-03 NOTE — Progress Notes (Signed)
 Pharmacy Quality Measure Review  This patient is appearing on a report for being at risk of failing the adherence measure for cholesterol (statin) medications this calendar year.   Medication: rosuvastatin  Last fill date: 09/20/2023 for 90 day supply  Contacted pharmacy to facilitate refills. Message sent to patient access team to schedule him for a PCP appt.   Herlene Fleeta Morris, PharmD, JAQUELINE, CPP Clinical Pharmacist [facility] [phone]

## 2024-03-04 ENCOUNTER — Other Ambulatory Visit: Payer: Self-pay | Admitting: Family Medicine

## 2024-03-04 DIAGNOSIS — E1165 Type 2 diabetes mellitus with hyperglycemia: Secondary | ICD-10-CM

## 2024-03-04 NOTE — Telephone Encounter (Signed)
 Requested medications are due for refill today.  yes  Requested medications are on the active medications list.  yes  Last refill. 02/03/2024 #60 0 rf  Future visit scheduled.   yes  Notes to clinic.  Labs are expired.    Requested Prescriptions  Pending Prescriptions Disp Refills   metFORMIN  (GLUCOPHAGE -XR) 500 MG 24 hr tablet [Pharmacy Med Name: METFORMIN  HCL ER 500 MG TABLET] 60 tablet 0    Sig: Take 1 tablet (500 mg total) by mouth 2 (two) times daily with a meal. Please schedule appointment with Haze Servant, PCP, for more refills.     Endocrinology:  Diabetes - Biguanides Failed - 03/04/2024  1:57 PM      Failed - Cr in normal range and within 360 days    Creatinine, Ser  Date Value Ref Range Status  04/01/2023 1.35 (H) 0.76 - 1.27 mg/dL Final   Creatinine, Urine  Date Value Ref Range Status  02/22/2023 76 mg/dL Final    Comment:    Performed at [facility], [address] [place], [place] [address]         Failed - HBA1C is between 0 and 7.9 and within 180 days    Hemoglobin A1C  Date Value Ref Range Status  07/07/2022 5.9  Final   Hgb A1c MFr Bld  Date Value Ref Range Status  01/19/2023 9.2 (H) 4.8 - 5.6 % Final    Comment:    (NOTE) Pre diabetes:          5.7%-6.4%  Diabetes:              >6.4%  Glycemic control for   <7.0% adults with diabetes          Failed - B12 Level in normal range and within 720 days    Vitamin B-12  Date Value Ref Range Status  02/23/2023 967 (H) 180 - 914 pg/mL Final    Comment:    (NOTE) This assay is not validated for testing neonatal or myeloproliferative syndrome specimens for Vitamin B12 levels. Performed at [facility], [address], [place], [place] [address]          Failed - Valid encounter within last 6 months    Recent Outpatient Visits           11 months ago Type 2 diabetes mellitus with hyperglycemia, unspecified whether long term insulin  use [facility])   [facility] [place]. [facility] [place], [place], [place]   1 year ago Type 2 diabetes mellitus with hyperglycemia, with long-term current use of insulin  [facility])   [facility] Dept Of [place]. [employer] [facility] Garnette LITTIE, RPH-CPP   1 year ago [employer] [facility] [employer] preventive visit   [facility] Dept Of [place]. [facility] Servant Haze W, NP   1 year ago Type 2 diabetes mellitus with hyperglycemia, with long-term current use of insulin  [facility])    [facility] [place]. [facility] Servant Haze ORN, NP   2 years ago Hospital discharge follow-up   [facility] - [facility] Dept Of [place]. [facility] [place], [place] W, NP              Failed - CBC within normal limits and completed in the last 12 months  WBC  Date Value Ref Range Status  02/25/2023 9.9 4.0 - 10.5 K/uL Final   RBC  Date Value Ref Range Status  02/25/2023 3.93 (L) 4.22 - 5.81 MIL/uL Final   Hemoglobin  Date Value Ref Range Status  02/25/2023 13.2 13.0 - 17.0 g/dL Final  97/93/7975 84.9 13.0 - 17.7 g/dL Final   HCT  Date Value Ref Range Status  02/25/2023 38.8 (L) 39.0 - 52.0 % Final   Hematocrit  Date Value Ref Range Status  04/08/2022 43.1 37.5 - 51.0 % Final   MCHC  Date Value Ref Range Status  02/25/2023 34.0 30.0 - 36.0 g/dL Final   [facility]  Date Value Ref Range Status  02/25/2023 33.6 26.0 - 34.0 pg Final   MCV  Date Value Ref Range Status  02/25/2023 98.7 80.0 - 100.0 fL Final  04/08/2022 101 (H) 79 - 97 fL Final   No results found for: PLTCOUNTKUC, LABPLAT, POCPLA RDW  Date Value Ref Range Status  02/25/2023 12.3 11.5 - 15.5 % Final  04/08/2022 12.2 11.6 - 15.4 % Final         Passed - eGFR in normal range and within 360 days    GFR calc Af Amer  Date Value Ref Range Status  08/17/2019 >60 >60 mL/min Final   GFR,  Estimated  Date Value Ref Range Status  02/28/2023 >60 >60 mL/min Final    Comment:    (NOTE) Calculated using the CKD-EPI Creatinine Equation (2021)    eGFR  Date Value Ref Range Status  04/01/2023 60 >59 mL/min/1.73 Final

## 2024-03-10 ENCOUNTER — Other Ambulatory Visit: Payer: Self-pay | Admitting: Family Medicine

## 2024-03-10 DIAGNOSIS — I10 Essential (primary) hypertension: Secondary | ICD-10-CM

## 2024-03-16 ENCOUNTER — Ambulatory Visit: Admitting: Nurse Practitioner

## 2024-03-28 ENCOUNTER — Other Ambulatory Visit: Payer: Self-pay | Admitting: Family Medicine

## 2024-03-28 DIAGNOSIS — E1165 Type 2 diabetes mellitus with hyperglycemia: Secondary | ICD-10-CM

## 2024-03-30 ENCOUNTER — Other Ambulatory Visit: Payer: Self-pay | Admitting: Family Medicine

## 2024-03-30 DIAGNOSIS — E1165 Type 2 diabetes mellitus with hyperglycemia: Secondary | ICD-10-CM

## 2024-03-31 ENCOUNTER — Other Ambulatory Visit: Payer: Self-pay | Admitting: Family Medicine

## 2024-04-04 ENCOUNTER — Other Ambulatory Visit: Payer: Self-pay | Admitting: Family Medicine

## 2024-04-04 DIAGNOSIS — I10 Essential (primary) hypertension: Secondary | ICD-10-CM

## 2024-04-05 ENCOUNTER — Ambulatory Visit: Admitting: Nurse Practitioner

## 2024-04-15 ENCOUNTER — Ambulatory Visit: Admitting: Nurse Practitioner
